# Patient Record
Sex: Male | Born: 1935
Health system: Southern US, Community
[De-identification: ages and names within clinical notes are randomized; demographics above are authoritative.]

## PROBLEM LIST (undated history)

## (undated) DIAGNOSIS — C4490 Unspecified malignant neoplasm of skin, unspecified: Secondary | ICD-10-CM

## (undated) DIAGNOSIS — I739 Peripheral vascular disease, unspecified: Secondary | ICD-10-CM

## (undated) DIAGNOSIS — I10 Essential (primary) hypertension: Secondary | ICD-10-CM

## (undated) DIAGNOSIS — I509 Heart failure, unspecified: Secondary | ICD-10-CM

## (undated) DIAGNOSIS — D649 Anemia, unspecified: Secondary | ICD-10-CM

## (undated) DIAGNOSIS — E119 Type 2 diabetes mellitus without complications: Secondary | ICD-10-CM

## (undated) DIAGNOSIS — E785 Hyperlipidemia, unspecified: Secondary | ICD-10-CM

## (undated) HISTORY — DX: Hyperlipidemia, unspecified: E78.5

## (undated) HISTORY — PX: BASAL CELL CARCINOMA EXCISION: SHX1214

## (undated) HISTORY — PX: TONSILLECTOMY: SHX5217

## (undated) HISTORY — DX: Peripheral vascular disease, unspecified: I73.9

## (undated) HISTORY — DX: Anemia, unspecified: D64.9

## (undated) HISTORY — PX: OTHER SURGICAL HISTORY: SHX169

## (undated) HISTORY — DX: Essential (primary) hypertension: I10

---

## 2006-07-01 ENCOUNTER — Ambulatory Visit: Payer: Self-pay | Admitting: Family Medicine

## 2006-07-08 ENCOUNTER — Ambulatory Visit: Payer: Self-pay | Admitting: Ophthalmology

## 2006-09-23 ENCOUNTER — Ambulatory Visit: Payer: Self-pay | Admitting: Gastroenterology

## 2007-05-11 ENCOUNTER — Ambulatory Visit: Payer: Self-pay | Admitting: Ophthalmology

## 2007-05-12 ENCOUNTER — Ambulatory Visit: Payer: Self-pay | Admitting: Ophthalmology

## 2007-06-10 ENCOUNTER — Other Ambulatory Visit: Payer: Self-pay

## 2007-06-10 ENCOUNTER — Emergency Department: Payer: Self-pay | Admitting: Emergency Medicine

## 2010-05-09 ENCOUNTER — Ambulatory Visit: Payer: Self-pay | Admitting: Family Medicine

## 2010-08-04 ENCOUNTER — Ambulatory Visit: Payer: Self-pay | Admitting: Internal Medicine

## 2010-08-05 ENCOUNTER — Inpatient Hospital Stay: Payer: Self-pay | Admitting: Internal Medicine

## 2010-08-09 LAB — PATHOLOGY REPORT

## 2010-09-01 ENCOUNTER — Inpatient Hospital Stay: Payer: Self-pay | Admitting: Internal Medicine

## 2010-09-03 ENCOUNTER — Ambulatory Visit: Payer: Self-pay | Admitting: Internal Medicine

## 2010-09-12 ENCOUNTER — Inpatient Hospital Stay: Payer: Self-pay | Admitting: Internal Medicine

## 2010-09-12 HISTORY — PX: OTHER SURGICAL HISTORY: SHX169

## 2010-09-21 LAB — PATHOLOGY REPORT

## 2010-10-04 ENCOUNTER — Ambulatory Visit: Payer: Self-pay | Admitting: Internal Medicine

## 2010-10-06 ENCOUNTER — Inpatient Hospital Stay: Payer: Self-pay | Admitting: Internal Medicine

## 2010-10-07 LAB — HM COLONOSCOPY

## 2011-03-03 DIAGNOSIS — C4442 Squamous cell carcinoma of skin of scalp and neck: Secondary | ICD-10-CM | POA: Insufficient documentation

## 2011-04-05 HISTORY — PX: MOHS SURGERY: SHX181

## 2011-08-09 ENCOUNTER — Emergency Department: Payer: Self-pay | Admitting: Emergency Medicine

## 2012-01-22 ENCOUNTER — Ambulatory Visit: Payer: Self-pay | Admitting: Family Medicine

## 2012-09-10 ENCOUNTER — Encounter: Payer: Self-pay | Admitting: Cardiothoracic Surgery

## 2012-09-10 ENCOUNTER — Encounter: Payer: Self-pay | Admitting: Nurse Practitioner

## 2012-09-30 ENCOUNTER — Ambulatory Visit: Payer: Self-pay | Admitting: Surgery

## 2012-09-30 LAB — CBC WITH DIFFERENTIAL/PLATELET
Basophil #: 0 10*3/uL (ref 0.0–0.1)
Basophil %: 0.9 %
Eosinophil #: 0.1 10*3/uL (ref 0.0–0.7)
Eosinophil %: 2.9 %
HCT: 30.6 % — ABNORMAL LOW (ref 40.0–52.0)
HGB: 10.3 g/dL — ABNORMAL LOW (ref 13.0–18.0)
Lymphocyte #: 1 10*3/uL (ref 1.0–3.6)
Lymphocyte %: 20.7 %
MCH: 30.1 pg (ref 26.0–34.0)
MCHC: 33.6 g/dL (ref 32.0–36.0)
MCV: 90 fL (ref 80–100)
Monocyte #: 0.7 x10 3/mm (ref 0.2–1.0)
Monocyte %: 14.2 %
Neutrophil #: 3 10*3/uL (ref 1.4–6.5)
Neutrophil %: 61.3 %
Platelet: 206 10*3/uL (ref 150–440)
RBC: 3.41 10*6/uL — ABNORMAL LOW (ref 4.40–5.90)
RDW: 13.2 % (ref 11.5–14.5)
WBC: 4.9 10*3/uL (ref 3.8–10.6)

## 2012-09-30 LAB — BASIC METABOLIC PANEL
Anion Gap: 5 — ABNORMAL LOW (ref 7–16)
BUN: 28 mg/dL — ABNORMAL HIGH (ref 7–18)
Calcium, Total: 8.5 mg/dL (ref 8.5–10.1)
Chloride: 111 mmol/L — ABNORMAL HIGH (ref 98–107)
Co2: 24 mmol/L (ref 21–32)
Creatinine: 1.33 mg/dL — ABNORMAL HIGH (ref 0.60–1.30)
EGFR (African American): 58 — ABNORMAL LOW
EGFR (Non-African Amer.): 50 — ABNORMAL LOW
Glucose: 90 mg/dL (ref 65–99)
Osmolality: 284 (ref 275–301)
Potassium: 4.5 mmol/L (ref 3.5–5.1)
Sodium: 140 mmol/L (ref 136–145)

## 2012-10-06 ENCOUNTER — Ambulatory Visit: Payer: Self-pay | Admitting: Surgery

## 2012-10-08 LAB — PATHOLOGY REPORT

## 2013-01-21 DIAGNOSIS — C4492 Squamous cell carcinoma of skin, unspecified: Secondary | ICD-10-CM | POA: Insufficient documentation

## 2013-02-10 DIAGNOSIS — Z4889 Encounter for other specified surgical aftercare: Secondary | ICD-10-CM | POA: Insufficient documentation

## 2013-02-10 DIAGNOSIS — Z09 Encounter for follow-up examination after completed treatment for conditions other than malignant neoplasm: Secondary | ICD-10-CM | POA: Insufficient documentation

## 2013-12-05 DIAGNOSIS — F09 Unspecified mental disorder due to known physiological condition: Secondary | ICD-10-CM | POA: Insufficient documentation

## 2013-12-23 ENCOUNTER — Other Ambulatory Visit: Payer: Self-pay | Admitting: Podiatry

## 2013-12-27 LAB — WOUND CULTURE

## 2014-07-04 DIAGNOSIS — N183 Chronic kidney disease, stage 3 (moderate): Secondary | ICD-10-CM | POA: Diagnosis not present

## 2014-07-04 DIAGNOSIS — E1129 Type 2 diabetes mellitus with other diabetic kidney complication: Secondary | ICD-10-CM | POA: Diagnosis not present

## 2014-07-04 DIAGNOSIS — Z1389 Encounter for screening for other disorder: Secondary | ICD-10-CM | POA: Diagnosis not present

## 2014-07-04 DIAGNOSIS — I10 Essential (primary) hypertension: Secondary | ICD-10-CM | POA: Diagnosis not present

## 2014-07-04 DIAGNOSIS — D509 Iron deficiency anemia, unspecified: Secondary | ICD-10-CM | POA: Diagnosis not present

## 2014-07-04 LAB — BASIC METABOLIC PANEL
BUN: 22 mg/dL — AB (ref 4–21)
Creatinine: 1.4 mg/dL — AB (ref 0.6–1.3)
GLUCOSE: 109 mg/dL
Potassium: 5 mmol/L (ref 3.4–5.3)
Sodium: 139 mmol/L (ref 137–147)

## 2014-07-04 LAB — CBC AND DIFFERENTIAL
HEMATOCRIT: 37 % — AB (ref 41–53)
HEMOGLOBIN: 12.1 g/dL — AB (ref 13.5–17.5)
Platelets: 240 10*3/uL (ref 150–399)
WBC: 6.2 10*3/mL

## 2014-07-04 LAB — PSA: PSA: 2.4

## 2014-07-04 LAB — HEMOGLOBIN A1C: HEMOGLOBIN A1C: 6.9 % — AB (ref 4.0–6.0)

## 2014-08-25 NOTE — Op Note (Signed)
PATIENT NAME:  CARSON, PRYOR MR#:  [id number] DATE OF BIRTH:  1931/03/22  DATE OF PROCEDURE:  10/06/2012  PREOPERATIVE DIAGNOSIS: Multiple soft tissue abscesses and plaques, in need of debridement.   POSTOPERATIVE DIAGNOSIS: Multiple scalp abscesses and plaques and soft tissue nodules.    PROCEDURE PERFORMED:  1. Incision, drainage and debridement of multiple scalp abscesses and necrotic tissue and plaques.  2. Incisional biopsy of scalp mass.   SURGEON: Ashlen Kiger A. Ronee Ranganathan, MD  ESTIMATED BLOOD LOSS: 30 mL.  COMPLICATIONS: None.   SPECIMEN: Incisional biopsy of apical scalp nodule.   ANESTHESIA: General.   INDICATION FOR SURGERY: Mr. Clyne is an 81 year old male who presents with a chronic history of scalp plaques and soft tissue abscesses which are recurrent on his scalp. He also has a history of scalp basal cell and squamous cell cancers.   DETAILS OF PROCEDURE: As follows: Mr. Bargar was brought to the operating room suite after informed consent was obtained. He was laid supine on the operating room table. He was induced. Endotracheal tube was placed. General anesthesia was administered. His head was prepped and draped in standard surgical fashion. A timeout was then performed, correctly identifying the patient name, operative site and procedure to be performed. I then proceeded to excise all plaques with underlying abscesses from Mr. Babauta scalp. There was mild bleeding with removal of these plaques, but there were no deep abscesses. There were some soft tissue masses which were concerning and for which I will refer the patient back to Dr. Nehemiah Massed. There was an apical scalp mass which looked consistent with a basal cell cancer, and I therefore performed an incisional biopsy of this. After all lesions were removed and hemostasis was obtained, I then placed bacitracin with nonstick Adaptic gauze over the entirety of the skull. I then placed 4 x 4 gauze sponges and  wrapped his head in Kerlix. He is to return in 48 hours for dressing removal and likely will start bacitracin on the scalp at that time. The dressings were then taken down. He was awoken, extubated and brought to the postanesthesia care unit. There were no immediate complications. Needle, sponge and instrument counts were correct at the end of the procedure.   ____________________________ Glena Norfolk. Shivansh Hardaway, MD cal:OSi D: 10/07/2012 07:02:38 ET T: 10/07/2012 07:13:08 ET JOB#: [id number]  cc: Harrell Gave A. Yao Hyppolite, MD, <Dictator> Floyde Parkins MD ELECTRONICALLY SIGNED 10/11/2012 20:58

## 2014-09-12 DIAGNOSIS — C4361 Malignant melanoma of right upper limb, including shoulder: Secondary | ICD-10-CM | POA: Diagnosis not present

## 2014-09-12 DIAGNOSIS — C44329 Squamous cell carcinoma of skin of other parts of face: Secondary | ICD-10-CM | POA: Diagnosis not present

## 2014-09-12 DIAGNOSIS — D485 Neoplasm of uncertain behavior of skin: Secondary | ICD-10-CM | POA: Diagnosis not present

## 2014-09-12 DIAGNOSIS — D0361 Melanoma in situ of right upper limb, including shoulder: Secondary | ICD-10-CM | POA: Diagnosis not present

## 2014-09-12 DIAGNOSIS — Z85828 Personal history of other malignant neoplasm of skin: Secondary | ICD-10-CM | POA: Diagnosis not present

## 2014-09-12 DIAGNOSIS — C44221 Squamous cell carcinoma of skin of unspecified ear and external auricular canal: Secondary | ICD-10-CM | POA: Diagnosis not present

## 2014-10-27 ENCOUNTER — Other Ambulatory Visit: Payer: Self-pay | Admitting: Family Medicine

## 2014-10-27 DIAGNOSIS — I10 Essential (primary) hypertension: Secondary | ICD-10-CM | POA: Insufficient documentation

## 2015-01-17 DIAGNOSIS — N184 Chronic kidney disease, stage 4 (severe): Secondary | ICD-10-CM | POA: Insufficient documentation

## 2015-01-17 DIAGNOSIS — E1121 Type 2 diabetes mellitus with diabetic nephropathy: Secondary | ICD-10-CM | POA: Insufficient documentation

## 2015-01-17 DIAGNOSIS — Z89511 Acquired absence of right leg below knee: Secondary | ICD-10-CM | POA: Insufficient documentation

## 2015-01-17 DIAGNOSIS — J309 Allergic rhinitis, unspecified: Secondary | ICD-10-CM | POA: Insufficient documentation

## 2015-01-17 DIAGNOSIS — M109 Gout, unspecified: Secondary | ICD-10-CM | POA: Insufficient documentation

## 2015-01-17 DIAGNOSIS — S88111A Complete traumatic amputation at level between knee and ankle, right lower leg, initial encounter: Secondary | ICD-10-CM | POA: Insufficient documentation

## 2015-01-17 DIAGNOSIS — S5290XA Unspecified fracture of unspecified forearm, initial encounter for closed fracture: Secondary | ICD-10-CM | POA: Insufficient documentation

## 2015-01-17 DIAGNOSIS — L409 Psoriasis, unspecified: Secondary | ICD-10-CM | POA: Insufficient documentation

## 2015-01-17 DIAGNOSIS — E785 Hyperlipidemia, unspecified: Secondary | ICD-10-CM | POA: Insufficient documentation

## 2015-01-17 DIAGNOSIS — C4490 Unspecified malignant neoplasm of skin, unspecified: Secondary | ICD-10-CM | POA: Insufficient documentation

## 2015-01-17 DIAGNOSIS — R609 Edema, unspecified: Secondary | ICD-10-CM | POA: Insufficient documentation

## 2015-01-17 DIAGNOSIS — Z85828 Personal history of other malignant neoplasm of skin: Secondary | ICD-10-CM | POA: Insufficient documentation

## 2015-01-17 DIAGNOSIS — D638 Anemia in other chronic diseases classified elsewhere: Secondary | ICD-10-CM | POA: Insufficient documentation

## 2015-01-19 ENCOUNTER — Encounter: Payer: Self-pay | Admitting: Family Medicine

## 2015-01-19 ENCOUNTER — Ambulatory Visit (INDEPENDENT_AMBULATORY_CARE_PROVIDER_SITE_OTHER): Payer: Medicare Other | Admitting: Family Medicine

## 2015-01-19 VITALS — BP 122/62 | HR 62 | Temp 97.5°F | Resp 16 | Ht 68.0 in | Wt 235.0 lb

## 2015-01-19 DIAGNOSIS — I10 Essential (primary) hypertension: Secondary | ICD-10-CM

## 2015-01-19 DIAGNOSIS — N183 Chronic kidney disease, stage 3 unspecified: Secondary | ICD-10-CM

## 2015-01-19 DIAGNOSIS — Z23 Encounter for immunization: Secondary | ICD-10-CM | POA: Diagnosis not present

## 2015-01-19 DIAGNOSIS — D539 Nutritional anemia, unspecified: Secondary | ICD-10-CM

## 2015-01-19 DIAGNOSIS — E785 Hyperlipidemia, unspecified: Secondary | ICD-10-CM | POA: Diagnosis not present

## 2015-01-19 DIAGNOSIS — R9431 Abnormal electrocardiogram [ECG] [EKG]: Secondary | ICD-10-CM

## 2015-01-19 DIAGNOSIS — E1121 Type 2 diabetes mellitus with diabetic nephropathy: Secondary | ICD-10-CM

## 2015-01-19 DIAGNOSIS — I4892 Unspecified atrial flutter: Secondary | ICD-10-CM | POA: Insufficient documentation

## 2015-01-19 DIAGNOSIS — Z0000 Encounter for general adult medical examination without abnormal findings: Secondary | ICD-10-CM

## 2015-01-19 NOTE — Patient Instructions (Signed)
Atrial Flutter °Atrial flutter is a heart rhythm that can cause the heart to beat very fast (tachycardia). It originates in the upper chambers of the heart (atria). In atrial flutter, the top chambers of the heart (atria) often beat much faster than the bottom chambers of the heart (ventricles). Atrial flutter has a regular "saw toothed" appearance in an EKG readout. An EKG is a test that records the electrical activity of the heart. Atrial flutter can cause the heart to beat up to 150 beats per minute (BPM). Atrial flutter can either be short lived (paroxysmal) or permanent.  °CAUSES  °Causes of atrial flutter can be many. Some of these include: °· Heart related issues: °¨ Heart attack (myocardial infarction). °¨ Heart failure. °¨ Heart valve problems. °¨ Poorly controlled high blood pressure (hypertension). °¨ After open heart surgery. °· Lung related issues: °¨ A blood clot in the lungs (pulmonary embolism). °¨ Chronic obstructive pulmonary disease (COPD). Medications used to treat COPD can attribute to atrial flutter. °· Other related causes: °¨ Hyperthyroidism. °¨ Caffeine. °¨ Some decongestant cold medications. °¨ Low electrolyte levels such as potassium or magnesium. °¨ Cocaine. °SYMPTOMS °· An awareness of your heart beating rapidly (palpitations). °· Shortness of breath. °· Chest pain. °· Low blood pressure (hypotension). °· Dizziness or fainting. °DIAGNOSIS  °Different tests can be performed to diagnose atrial flutter.  °· An EKG. °· Holter monitor. This is a 24-hour recording of your heart rhythm. You will also be given a diary. Write down all symptoms that you have and what you were doing at the time you experienced symptoms. °· Cardiac event monitor. This small device can be worn for up to 30 days. When you have heart symptoms, you will push a button on the device. This will then record your heart rhythm. °· Echocardiogram. This is an imaging test to look at your heart. Your caregiver will look at your  heart valves and the ventricles. °· Stress test. This test can help determine if the atrial flutter is related to exercise or if coronary artery disease is present. °· Laboratory studies will look at certain blood levels like: °¨ Complete blood count (CBC). °¨ Potassium. °¨ Magnesium. °¨ Thyroid function. °TREATMENT  °Treatment of atrial flutter varies. A combination of therapies may be used or sometimes atrial flutter may need only 1 type of treatment.  °Lab work: °If your blood work, such as your electrolytes (potassium, magnesium) or your thyroid function tests, are abnormal, your caregiver will treat them accordingly.  °Medication:  °There are several different types of medications that can convert your heart to a normal rhythm and prevent atrial flutter from reoccurring.  °Nonsurgical procedures: °Nonsurgical techniques may be used to control atrial flutter. Some examples include: °· Cardioversion. This technique uses either drugs or an electrical shock to restore a normal heart rhythm: °¨ Cardioversion drugs may be given through an intravenous (IV) line to help "reset" the heart rhythm. °¨ In electrical cardioversion, your caregiver shocks your heart with electrical energy. This helps to reset the heartbeat to a normal rhythm. °· Ablation. If atrial flutter is a persistent problem, an ablation may be needed. This procedure is done under mild sedation. High frequency radio-wave energy is used to destroy the area of heart tissue responsible for atrial flutter. °SEEK IMMEDIATE MEDICAL CARE IF:  °You have: °· Dizziness. °· Near fainting or fainting. °· Shortness of breath. °· Chest pain or pressure. °· Sudden nausea or vomiting. °· Profuse sweating. °If you have the above symptoms,   call your local emergency service immediately! Do not drive yourself to the hospital. °MAKE SURE YOU:  °· Understand these instructions. °· Will watch your condition. °· Will get help right away if you are not doing well or get  worse. °Document Released: 09/07/2008 Document Revised: 09/05/2013 Document Reviewed: 09/07/2008 °ExitCare® Patient Information ©2015 [employer]. This information is not intended to replace advice given to you by your health care provider. Make sure you discuss any questions you have with your health care provider. ° °

## 2015-01-19 NOTE — Progress Notes (Signed)
Patient: Miguel Torres, Male    DOB: 05-17-1930, 81 y.o.   MRN: [id number] Visit Date: 01/19/2015  Today's Provider: Lelon Huh, MD   Chief Complaint  Patient presents with  . Physical  . Hypertension    Follow up  . Diabetes    Follow up  . Hyperlipidemia    Follow up   Subjective:    Annual physical  Miguel Torres is a 81 y.o. male. He feels fairly well. He reports exercising none. He reports he is sleeping fairly well.  -----------------------------------------------------------   Diabetes Mellitus Type II, Follow-up:   Lab Results  Component Value Date   HGBA1C 6.9* 07/04/2014    Last seen for diabetes 6 months ago.  Management since then includes  temporally putting Metformin on hold due to possible cause of Edema. He reports good compliance with treatment. He is not having side effects.  Current symptoms include polyuria and have been stable. Home blood sugar records: home glucose is not neing checked  Episodes of hypoglycemia? no   Current Insulin Regimen:  Patient is not taking prescribed Insulins  Most Recent Eye Exam: >1 year Weight trend: stable Prior visit with dietician: no Current diet: in general, a "healthy" diet   Current exercise: none  Pertinent Labs:    Component Value Date/Time   CREATININE 1.4* 07/04/2014   CREATININE 1.33* 09/30/2012 1609    Wt Readings from Last 3 Encounters:  07/04/14 246 lb (111.585 kg)    ------------------------------------------------------------------------   Hypertension, follow-up:  BP Readings from Last 3 Encounters:  07/04/14 128/82    He was last seen for hypertension 6 months ago.  BP at that visit was  128/82. Management since that visit includes no changes . He reports good compliance with treatment. He is not having side effects.  He is not exercising. He is not adherent to low salt diet.   Outside blood pressures are  Not being checked. He is experiencing dyspnea,  fatigue and lower extremity edema.  Patient denies chest pain, chest pressure/discomfort, claudication, exertional chest pressure/discomfort, irregular heart beat, near-syncope, orthopnea, palpitations, paroxysmal nocturnal dyspnea, syncope and tachypnea.   Cardiovascular risk factors include diabetes mellitus, dyslipidemia, hypertension and male gender.  Use of agents associated with hypertension: none.     Weight trend: stable Wt Readings from Last 3 Encounters:  07/04/14 246 lb (111.585 kg)    Current diet: in general, a "healthy" diet    ------------------------------------------------------------------------   Lipid/Cholesterol, Follow-up:   Last seen for this6 months ago.  Management changes since that visit include  none. . Last Lipid Panel: No results found for: CHOL, TRIG, HDL, CHOLHDL, VLDL, LDLCALC, LDLDIRECT  Risk factors for vascular disease include diabetes mellitus, hypercholesterolemia and hypertension  He reports good compliance with treatment. He is not having side effects.  Current symptoms include polyuria and have been stable. Weight trend: stable Prior visit with dietician: no Current diet: in general, a "healthy" diet   Current exercise: none  Wt Readings from Last 3 Encounters:  07/04/14 246 lb (111.585 kg)    -------------------------------------------------------------------  Review of Systems  Constitutional: Positive for fatigue. Negative for fever, chills and appetite change.  HENT: Positive for postnasal drip, sinus pressure and sneezing. Negative for congestion, ear pain, hearing loss, nosebleeds and trouble swallowing.   Eyes: Positive for photophobia, discharge and redness. Negative for pain and visual disturbance.  Respiratory: Positive for cough and shortness of breath. Negative for chest tightness.   Cardiovascular: Positive  for leg swelling. Negative for chest pain and palpitations.  Gastrointestinal: Negative for nausea, vomiting,  abdominal pain, diarrhea, constipation and blood in stool.  Endocrine: Negative for polydipsia, polyphagia and polyuria.  Genitourinary: Positive for frequency and penile swelling. Negative for dysuria, urgency, flank pain and decreased urine volume.  Musculoskeletal: Positive for gait problem and neck pain. Negative for myalgias, back pain, joint swelling, arthralgias and neck stiffness.  Skin: Positive for rash and wound. Negative for color change.  Allergic/Immunologic: Positive for environmental allergies.  Neurological: Positive for speech difficulty. Negative for dizziness, tremors, seizures, weakness, light-headedness and headaches.  Hematological: Bruises/bleeds easily.  Psychiatric/Behavioral: Negative for behavioral problems, confusion, sleep disturbance, dysphoric mood and decreased concentration. The patient is not nervous/anxious.   All other systems reviewed and are negative.   Social History   Social History  . Marital Status: Married    Spouse Name: N/A  . Number of Children: 5  . Years of Education: 5th grade   Occupational History  . Not on file.   Social History Main Topics  . Smoking status: Never Smoker   . Smokeless tobacco: Never Used  . Alcohol Use: No  . Drug Use: No  . Sexual Activity: Not on file   Other Topics Concern  . Not on file   Social History Narrative  . No narrative on file    Patient Active Problem List   Diagnosis Date Noted  . Allergic rhinitis 01/17/2015  . Deficiency anemia 01/17/2015  . Chronic kidney disease (CKD), stage III (moderate) 01/17/2015  . Diabetes 01/17/2015  . Edema 01/17/2015  . Gout 01/17/2015  . Amputation of right lower extremity below knee upon examination 01/17/2015  . History of other malignant neoplasm of skin 01/17/2015  . HLD (hyperlipidemia) 01/17/2015  . Psoriasis 01/17/2015  . Closed fracture of radius 01/17/2015  . CA of skin 01/17/2015  . Hypertension 10/27/2014  . Mild cognitive disorder  12/05/2013  . Squamous cell carcinoma of scalp and skin of neck 03/03/2011  . Malignant neoplasm 10/09/2008    Past Surgical History  Procedure Laterality Date  . Basal cell carcinoma excision    . History of eye surgery Bilateral   . Tonsillectomy    . Right bka  09/12/2010    Dr. Delana Meyer; Secondary gangrenous right LE  . Mohs surgery  04/2011    the top of his head    His family history includes Arthritis in his sister; Diabetes in his mother.    Previous Medications   ACETAMINOPHEN (TYLENOL) 325 MG TABLET    Take 650 mg by mouth. As needed   ALLOPURINOL (ZYLOPRIM) 300 MG TABLET    Take 300 mg by mouth 2 (two) times daily.   ATENOLOL-CHLORTHALIDONE (TENORETIC) 50-25 MG PER TABLET    Take 1 tablet by mouth daily.   FELODIPINE (PLENDIL) 2.5 MG 24 HR TABLET    TAKE 1 TABLET BY MOUTH ONCE A DAY   FERROUS SULFATE 325 (65 FE) MG TABLET    Take 1 tablet by mouth daily.   FLUOCINONIDE OINTMENT (LIDEX) 0.05 %    FLUOCINONIDE, 0.05% (External Ointment) - Historical Medication  (0.05 %) Active   FUROSEMIDE (LASIX) 20 MG TABLET    Take 1 tablet by mouth daily as needed. For swelling   GLIPIZIDE (GLUCOTROL XL) 10 MG 24 HR TABLET    Take 10 mg by mouth daily.   HYDROCODONE-ACETAMINOPHEN (NORCO/VICODIN) 5-325 MG PER TABLET    Take 1 tablet by mouth every 6 (six) hours as  needed.   INDOMETHACIN (INDOCIN) 50 MG CAPSULE    Take 1 capsule by mouth 3 (three) times daily.   IPRATROPIUM (ATROVENT) 0.03 % NASAL SPRAY    Place into the nose. 2 sprays 2 times per day in each nostril   LISINOPRIL (PRINIVIL,ZESTRIL) 40 MG TABLET    Take 40 mg by mouth daily.   LORATADINE (CLARITIN) 10 MG TABLET    Take 10 mg by mouth daily.   LOVASTATIN (MEVACOR) 40 MG TABLET    Take 40 mg by mouth daily.   METFORMIN (GLUCOPHAGE-XR) 500 MG 24 HR TABLET    Take 500 mg by mouth daily.   MONTELUKAST (SINGULAIR) 10 MG TABLET    Take 10 mg by mouth every evening.   PIOGLITAZONE (ACTOS) 45 MG TABLET    Take 45 mg by mouth  daily.   POLYETHYLENE GLYCOL (MIRALAX / GLYCOLAX) PACKET    Take by mouth.   TAMSULOSIN (FLOMAX) 0.4 MG CAPS CAPSULE    Take 0.4 mg by mouth.   TRIAMCINOLONE LOTION (KENALOG) 0.1 %    TRIAMCINOLONE ACETONIDE, 0.1% (External Lotion)  1 application daily for 0 days  Quantity: 120;  Refills: 3   Ordered :26-November-2010  [name] ;  Started 26-November-2010 Active    Patient Care Team: [name], MD as PCP - General (Family Medicine) Nelda Bucks, MD (Dermatology) Laural Roes, MD (Ophthalmology)     Objective:   Vitals: BP 122/62 mmHg  Pulse 62  Temp(Src) 97.5 F (36.4 C) (Oral)  Resp 16  Ht 5\' 8"  (1.727 m)  Wt 235 lb (106.595 kg)  BMI 35.74 kg/m2  SpO2 100%  Physical Exam   General Appearance:    Alert, cooperative, no distress, obese.   Eyes:    PERRL, conjunctiva/corneas clear, EOM's intact       Skin:   Extensive hyperkeratotic exophytic lesions scattered over scalp.   Lungs:     Clear to auscultation bilaterally, respirations unlabored  Heart:    Regular rate and rhythm. I/IV systolic murmur.   Neurologic:   Awake, alert, oriented x 3. No apparent focal neurological           defect.   MS:   S/p Right BKA. 2+ edema Left LE    Activities of Daily Living In your present state of health, do you have any difficulty performing the following activities: 01/19/2015  Hearing? Y  Vision? N  Difficulty concentrating or making decisions? N  Walking or climbing stairs? Y  Dressing or bathing? Y  Doing errands, shopping? N    Fall Risk Assessment Fall Risk  01/19/2015  Falls in the past year? No     Depression Screen PHQ 2/9 Scores 01/19/2015  PHQ - 2 Score 0  PHQ- 9 Score 2    Cognitive Testing - 6-CIT  Correct? Score   What year is it? yes 0 0 or 4  What month is it? yes 0 0 or 3  Memorize:    Pia Mau,  44,  [place],      What time is it? (within 1 hour) yes 0 0 or 3  Count backwards from 20 yes 0 0, 2, or 4  Name the months of  the year yes 0 0, 2, or 4  Repeat name & address above no 8 0, 2, 4, 6, 8, or 10       TOTAL SCORE  8/28   Interpretation:  Abnormal- .  Normal (0-7) Abnormal (8-28)  Audit-C Alcohol Use Screening  Question Answer Points  How often do you have alcoholic drink? never 0  How many drinks do you typically consume in a day? 0 0  How oftey will you drink 6 or more in a total? never 0  Total Score:  0   A score of 3 or more in women, and 4 or more in men indicates increased risk for alcohol abuse, EXCEPT if all of the points are from question 1.  EKG: Atrial flutter, Lateral ischemia Assessment & Plan:     Annual Wellness Visit  Reviewed patient's Family Medical History Reviewed and updated list of patient's medical providers Assessment of cognitive impairment was done Assessed patient's functional ability Established a written schedule for health screening [place] Completed and Reviewed  Exercise Activities and Dietary recommendations Goals    None      Immunization History  Administered Date(s) Administered  . Pneumococcal Conjugate-13 12/05/2013  . Pneumococcal Polysaccharide-23 02/22/2007    Health Maintenance  Topic Date Due  . FOOT EXAM  09/04/1942  . OPHTHALMOLOGY EXAM  09/04/1942  . URINE MICROALBUMIN  09/04/1942  . TETANUS/TDAP  09/04/1951  . ZOSTAVAX  09/03/1992  . INFLUENZA VACCINE  12/04/2014  . HEMOGLOBIN A1C  01/04/2015  . COLONOSCOPY  10/06/2020  . PNA vac Low Risk Adult  Completed      Discussed health benefits of physical activity, and encouraged him to engage in regular exercise appropriate for his age and condition.    ------------------------------------------------------------------------------------------------------------ 1. Annual physical exam   2. Chronic kidney disease (CKD), stage III (moderate)  - Renal function panel  3. Deficiency anemia  - CBC  4. Type 2 diabetes mellitus with diabetic  nephropathy  - Hemoglobin A1c  5. HLD (hyperlipidemia) He is tolerating lovastatin well with no adverse effects.   - Lipid panel - TSH - Hepatic function panel  6. Essential hypertension well controlled Continue current medications.   - EKG 12-Lead  7. Need for influenza vaccination  - Flu vaccine HIGH DOSE PF  8. Atrial flutter, unspecified New finding since EKG of 2014. Rate is well controlled, probably due to being on atenolol.  - Ambulatory referral to Cardiology  9. Abnormal finding on EKG Lateral ischemic changes on EKG. Continue beta-blocker. Start 81mg  [facility] - Ambulatory referral to Cardiology

## 2015-01-20 LAB — CBC
HEMATOCRIT: 34.8 % — AB (ref 37.5–51.0)
HEMOGLOBIN: 11.7 g/dL — AB (ref 12.6–17.7)
MCH: 29.7 pg (ref 26.6–33.0)
MCHC: 33.6 g/dL (ref 31.5–35.7)
MCV: 88 fL (ref 79–97)
Platelets: 252 10*3/uL (ref 150–379)
RBC: 3.94 x10E6/uL — ABNORMAL LOW (ref 4.14–5.80)
RDW: 13.3 % (ref 12.3–15.4)
WBC: 8.2 10*3/uL (ref 3.4–10.8)

## 2015-01-20 LAB — RENAL FUNCTION PANEL
ALBUMIN: 3.5 g/dL (ref 3.5–4.7)
BUN / CREAT RATIO: 20 (ref 10–22)
BUN: 31 mg/dL — AB (ref 8–27)
CALCIUM: 8.7 mg/dL (ref 8.6–10.2)
CHLORIDE: 100 mmol/L (ref 97–108)
CO2: 23 mmol/L (ref 18–29)
CREATININE: 1.57 mg/dL — AB (ref 0.76–1.27)
GFR calc Af Amer: 47 mL/min/{1.73_m2} — ABNORMAL LOW (ref >59)
GFR calc non Af Amer: 40 mL/min/{1.73_m2} — ABNORMAL LOW (ref >59)
Glucose: 227 mg/dL — ABNORMAL HIGH (ref 65–99)
PHOSPHORUS: 3.2 mg/dL (ref 2.5–4.5)
Potassium: 4.8 mmol/L (ref 3.5–5.2)
Sodium: 137 mmol/L (ref 134–144)

## 2015-01-20 LAB — LIPID PANEL
CHOL/HDL RATIO: 2.4 ratio (ref 0.0–5.0)
CHOLESTEROL TOTAL: 103 mg/dL (ref 100–199)
HDL: 43 mg/dL (ref >39)
LDL Calculated: 36 mg/dL (ref 0–99)
Triglycerides: 121 mg/dL (ref 0–149)
VLDL Cholesterol Cal: 24 mg/dL (ref 5–40)

## 2015-01-20 LAB — HEPATIC FUNCTION PANEL
ALT: 13 IU/L (ref 0–44)
AST: 22 IU/L (ref 0–40)
Alkaline Phosphatase: 96 IU/L (ref 39–117)
BILIRUBIN, DIRECT: 0.07 mg/dL (ref 0.00–0.40)
TOTAL PROTEIN: 6.6 g/dL (ref 6.0–8.5)

## 2015-01-20 LAB — HEMOGLOBIN A1C
ESTIMATED AVERAGE GLUCOSE: 171 mg/dL
HEMOGLOBIN A1C: 7.6 % — AB (ref 4.8–5.6)

## 2015-01-20 LAB — TSH: TSH: 2.45 u[IU]/mL (ref 0.450–4.500)

## 2015-01-22 ENCOUNTER — Telehealth: Payer: Self-pay | Admitting: Family Medicine

## 2015-01-22 NOTE — Telephone Encounter (Signed)
resutls given to pt's daughter Shirlean Mylar. Also, advised Shirlean Mylar that patient should be taking coated aspirin 81 mg. Shirlean Mylar also stated that they waiting to here about referral to cardiologist.

## 2015-01-22 NOTE — Telephone Encounter (Signed)
Pt's daughter Shirlean Mylar calling [facility]. Back about test results.  CC

## 2015-02-02 DIAGNOSIS — K264 Chronic or unspecified duodenal ulcer with hemorrhage: Secondary | ICD-10-CM | POA: Insufficient documentation

## 2015-02-16 ENCOUNTER — Other Ambulatory Visit: Payer: Self-pay | Admitting: Family Medicine

## 2015-03-02 ENCOUNTER — Other Ambulatory Visit: Payer: Self-pay | Admitting: Family Medicine

## 2015-03-07 DIAGNOSIS — C4359 Malignant melanoma of other part of trunk: Secondary | ICD-10-CM | POA: Diagnosis not present

## 2015-03-07 DIAGNOSIS — C44320 Squamous cell carcinoma of skin of unspecified parts of face: Secondary | ICD-10-CM | POA: Diagnosis not present

## 2015-03-07 DIAGNOSIS — I10 Essential (primary) hypertension: Secondary | ICD-10-CM | POA: Diagnosis not present

## 2015-03-07 DIAGNOSIS — E785 Hyperlipidemia, unspecified: Secondary | ICD-10-CM | POA: Diagnosis not present

## 2015-03-07 DIAGNOSIS — C4442 Squamous cell carcinoma of skin of scalp and neck: Secondary | ICD-10-CM | POA: Diagnosis not present

## 2015-03-07 DIAGNOSIS — E119 Type 2 diabetes mellitus without complications: Secondary | ICD-10-CM | POA: Diagnosis not present

## 2015-03-07 DIAGNOSIS — C4362 Malignant melanoma of left upper limb, including shoulder: Secondary | ICD-10-CM | POA: Diagnosis not present

## 2015-03-07 DIAGNOSIS — Z483 Aftercare following surgery for neoplasm: Secondary | ICD-10-CM | POA: Diagnosis not present

## 2015-03-09 DIAGNOSIS — C4442 Squamous cell carcinoma of skin of scalp and neck: Secondary | ICD-10-CM | POA: Diagnosis not present

## 2015-03-09 DIAGNOSIS — C44320 Squamous cell carcinoma of skin of unspecified parts of face: Secondary | ICD-10-CM | POA: Diagnosis not present

## 2015-03-09 DIAGNOSIS — Z483 Aftercare following surgery for neoplasm: Secondary | ICD-10-CM | POA: Diagnosis not present

## 2015-03-09 DIAGNOSIS — C4362 Malignant melanoma of left upper limb, including shoulder: Secondary | ICD-10-CM | POA: Diagnosis not present

## 2015-03-09 DIAGNOSIS — I10 Essential (primary) hypertension: Secondary | ICD-10-CM | POA: Diagnosis not present

## 2015-03-09 DIAGNOSIS — C4359 Malignant melanoma of other part of trunk: Secondary | ICD-10-CM | POA: Diagnosis not present

## 2015-03-09 DIAGNOSIS — E119 Type 2 diabetes mellitus without complications: Secondary | ICD-10-CM | POA: Diagnosis not present

## 2015-03-09 DIAGNOSIS — E785 Hyperlipidemia, unspecified: Secondary | ICD-10-CM | POA: Diagnosis not present

## 2015-03-12 DIAGNOSIS — E785 Hyperlipidemia, unspecified: Secondary | ICD-10-CM | POA: Diagnosis not present

## 2015-03-12 DIAGNOSIS — E119 Type 2 diabetes mellitus without complications: Secondary | ICD-10-CM | POA: Diagnosis not present

## 2015-03-12 DIAGNOSIS — I10 Essential (primary) hypertension: Secondary | ICD-10-CM | POA: Diagnosis not present

## 2015-03-12 DIAGNOSIS — C44320 Squamous cell carcinoma of skin of unspecified parts of face: Secondary | ICD-10-CM | POA: Diagnosis not present

## 2015-03-12 DIAGNOSIS — C4359 Malignant melanoma of other part of trunk: Secondary | ICD-10-CM | POA: Diagnosis not present

## 2015-03-12 DIAGNOSIS — C4362 Malignant melanoma of left upper limb, including shoulder: Secondary | ICD-10-CM | POA: Diagnosis not present

## 2015-03-12 DIAGNOSIS — Z483 Aftercare following surgery for neoplasm: Secondary | ICD-10-CM | POA: Diagnosis not present

## 2015-03-12 DIAGNOSIS — C4442 Squamous cell carcinoma of skin of scalp and neck: Secondary | ICD-10-CM | POA: Diagnosis not present

## 2015-03-14 DIAGNOSIS — C4442 Squamous cell carcinoma of skin of scalp and neck: Secondary | ICD-10-CM | POA: Diagnosis not present

## 2015-03-14 DIAGNOSIS — E785 Hyperlipidemia, unspecified: Secondary | ICD-10-CM | POA: Diagnosis not present

## 2015-03-14 DIAGNOSIS — C44320 Squamous cell carcinoma of skin of unspecified parts of face: Secondary | ICD-10-CM | POA: Diagnosis not present

## 2015-03-14 DIAGNOSIS — C4359 Malignant melanoma of other part of trunk: Secondary | ICD-10-CM | POA: Diagnosis not present

## 2015-03-14 DIAGNOSIS — Z483 Aftercare following surgery for neoplasm: Secondary | ICD-10-CM | POA: Diagnosis not present

## 2015-03-14 DIAGNOSIS — C4362 Malignant melanoma of left upper limb, including shoulder: Secondary | ICD-10-CM | POA: Diagnosis not present

## 2015-03-14 DIAGNOSIS — I10 Essential (primary) hypertension: Secondary | ICD-10-CM | POA: Diagnosis not present

## 2015-03-14 DIAGNOSIS — E119 Type 2 diabetes mellitus without complications: Secondary | ICD-10-CM | POA: Diagnosis not present

## 2015-03-16 DIAGNOSIS — C4362 Malignant melanoma of left upper limb, including shoulder: Secondary | ICD-10-CM | POA: Diagnosis not present

## 2015-03-16 DIAGNOSIS — C44320 Squamous cell carcinoma of skin of unspecified parts of face: Secondary | ICD-10-CM | POA: Diagnosis not present

## 2015-03-16 DIAGNOSIS — Z483 Aftercare following surgery for neoplasm: Secondary | ICD-10-CM | POA: Diagnosis not present

## 2015-03-16 DIAGNOSIS — E119 Type 2 diabetes mellitus without complications: Secondary | ICD-10-CM | POA: Diagnosis not present

## 2015-03-16 DIAGNOSIS — C4442 Squamous cell carcinoma of skin of scalp and neck: Secondary | ICD-10-CM | POA: Diagnosis not present

## 2015-03-16 DIAGNOSIS — C4359 Malignant melanoma of other part of trunk: Secondary | ICD-10-CM | POA: Diagnosis not present

## 2015-03-16 DIAGNOSIS — E785 Hyperlipidemia, unspecified: Secondary | ICD-10-CM | POA: Diagnosis not present

## 2015-03-16 DIAGNOSIS — I10 Essential (primary) hypertension: Secondary | ICD-10-CM | POA: Diagnosis not present

## 2015-03-19 DIAGNOSIS — E785 Hyperlipidemia, unspecified: Secondary | ICD-10-CM | POA: Diagnosis not present

## 2015-03-19 DIAGNOSIS — C4362 Malignant melanoma of left upper limb, including shoulder: Secondary | ICD-10-CM | POA: Diagnosis not present

## 2015-03-19 DIAGNOSIS — Z483 Aftercare following surgery for neoplasm: Secondary | ICD-10-CM | POA: Diagnosis not present

## 2015-03-19 DIAGNOSIS — C4359 Malignant melanoma of other part of trunk: Secondary | ICD-10-CM | POA: Diagnosis not present

## 2015-03-19 DIAGNOSIS — I10 Essential (primary) hypertension: Secondary | ICD-10-CM | POA: Diagnosis not present

## 2015-03-19 DIAGNOSIS — E119 Type 2 diabetes mellitus without complications: Secondary | ICD-10-CM | POA: Diagnosis not present

## 2015-03-19 DIAGNOSIS — C4442 Squamous cell carcinoma of skin of scalp and neck: Secondary | ICD-10-CM | POA: Diagnosis not present

## 2015-03-19 DIAGNOSIS — C44320 Squamous cell carcinoma of skin of unspecified parts of face: Secondary | ICD-10-CM | POA: Diagnosis not present

## 2015-03-21 DIAGNOSIS — E785 Hyperlipidemia, unspecified: Secondary | ICD-10-CM | POA: Diagnosis not present

## 2015-03-21 DIAGNOSIS — I10 Essential (primary) hypertension: Secondary | ICD-10-CM | POA: Diagnosis not present

## 2015-03-21 DIAGNOSIS — E119 Type 2 diabetes mellitus without complications: Secondary | ICD-10-CM | POA: Diagnosis not present

## 2015-03-21 DIAGNOSIS — C4362 Malignant melanoma of left upper limb, including shoulder: Secondary | ICD-10-CM | POA: Diagnosis not present

## 2015-03-21 DIAGNOSIS — C4359 Malignant melanoma of other part of trunk: Secondary | ICD-10-CM | POA: Diagnosis not present

## 2015-03-21 DIAGNOSIS — Z483 Aftercare following surgery for neoplasm: Secondary | ICD-10-CM | POA: Diagnosis not present

## 2015-03-21 DIAGNOSIS — C44320 Squamous cell carcinoma of skin of unspecified parts of face: Secondary | ICD-10-CM | POA: Diagnosis not present

## 2015-03-21 DIAGNOSIS — C4442 Squamous cell carcinoma of skin of scalp and neck: Secondary | ICD-10-CM | POA: Diagnosis not present

## 2015-03-23 DIAGNOSIS — C44320 Squamous cell carcinoma of skin of unspecified parts of face: Secondary | ICD-10-CM | POA: Diagnosis not present

## 2015-03-23 DIAGNOSIS — C4359 Malignant melanoma of other part of trunk: Secondary | ICD-10-CM | POA: Diagnosis not present

## 2015-03-23 DIAGNOSIS — E119 Type 2 diabetes mellitus without complications: Secondary | ICD-10-CM | POA: Diagnosis not present

## 2015-03-23 DIAGNOSIS — Z483 Aftercare following surgery for neoplasm: Secondary | ICD-10-CM | POA: Diagnosis not present

## 2015-03-23 DIAGNOSIS — E785 Hyperlipidemia, unspecified: Secondary | ICD-10-CM | POA: Diagnosis not present

## 2015-03-23 DIAGNOSIS — C4362 Malignant melanoma of left upper limb, including shoulder: Secondary | ICD-10-CM | POA: Diagnosis not present

## 2015-03-23 DIAGNOSIS — C4442 Squamous cell carcinoma of skin of scalp and neck: Secondary | ICD-10-CM | POA: Diagnosis not present

## 2015-03-23 DIAGNOSIS — I10 Essential (primary) hypertension: Secondary | ICD-10-CM | POA: Diagnosis not present

## 2015-03-26 DIAGNOSIS — E785 Hyperlipidemia, unspecified: Secondary | ICD-10-CM | POA: Diagnosis not present

## 2015-03-26 DIAGNOSIS — C4442 Squamous cell carcinoma of skin of scalp and neck: Secondary | ICD-10-CM | POA: Diagnosis not present

## 2015-03-26 DIAGNOSIS — C44320 Squamous cell carcinoma of skin of unspecified parts of face: Secondary | ICD-10-CM | POA: Diagnosis not present

## 2015-03-26 DIAGNOSIS — Z483 Aftercare following surgery for neoplasm: Secondary | ICD-10-CM | POA: Diagnosis not present

## 2015-03-26 DIAGNOSIS — C4359 Malignant melanoma of other part of trunk: Secondary | ICD-10-CM | POA: Diagnosis not present

## 2015-03-26 DIAGNOSIS — E119 Type 2 diabetes mellitus without complications: Secondary | ICD-10-CM | POA: Diagnosis not present

## 2015-03-26 DIAGNOSIS — C4362 Malignant melanoma of left upper limb, including shoulder: Secondary | ICD-10-CM | POA: Diagnosis not present

## 2015-03-26 DIAGNOSIS — I10 Essential (primary) hypertension: Secondary | ICD-10-CM | POA: Diagnosis not present

## 2015-03-27 DIAGNOSIS — Z85828 Personal history of other malignant neoplasm of skin: Secondary | ICD-10-CM | POA: Diagnosis not present

## 2015-03-27 DIAGNOSIS — L988 Other specified disorders of the skin and subcutaneous tissue: Secondary | ICD-10-CM | POA: Diagnosis not present

## 2015-03-28 ENCOUNTER — Other Ambulatory Visit: Payer: Self-pay | Admitting: Family Medicine

## 2015-03-28 DIAGNOSIS — I10 Essential (primary) hypertension: Secondary | ICD-10-CM | POA: Diagnosis not present

## 2015-03-28 DIAGNOSIS — C4442 Squamous cell carcinoma of skin of scalp and neck: Secondary | ICD-10-CM | POA: Diagnosis not present

## 2015-03-28 DIAGNOSIS — C44320 Squamous cell carcinoma of skin of unspecified parts of face: Secondary | ICD-10-CM | POA: Diagnosis not present

## 2015-03-28 DIAGNOSIS — E119 Type 2 diabetes mellitus without complications: Secondary | ICD-10-CM | POA: Diagnosis not present

## 2015-03-28 DIAGNOSIS — Z483 Aftercare following surgery for neoplasm: Secondary | ICD-10-CM | POA: Diagnosis not present

## 2015-03-28 DIAGNOSIS — E785 Hyperlipidemia, unspecified: Secondary | ICD-10-CM | POA: Diagnosis not present

## 2015-03-28 DIAGNOSIS — C4362 Malignant melanoma of left upper limb, including shoulder: Secondary | ICD-10-CM | POA: Diagnosis not present

## 2015-03-28 DIAGNOSIS — C4359 Malignant melanoma of other part of trunk: Secondary | ICD-10-CM | POA: Diagnosis not present

## 2015-03-29 ENCOUNTER — Other Ambulatory Visit: Payer: Self-pay | Admitting: Family Medicine

## 2015-03-30 DIAGNOSIS — E785 Hyperlipidemia, unspecified: Secondary | ICD-10-CM | POA: Diagnosis not present

## 2015-03-30 DIAGNOSIS — C4362 Malignant melanoma of left upper limb, including shoulder: Secondary | ICD-10-CM | POA: Diagnosis not present

## 2015-03-30 DIAGNOSIS — C4359 Malignant melanoma of other part of trunk: Secondary | ICD-10-CM | POA: Diagnosis not present

## 2015-03-30 DIAGNOSIS — Z483 Aftercare following surgery for neoplasm: Secondary | ICD-10-CM | POA: Diagnosis not present

## 2015-03-30 DIAGNOSIS — I10 Essential (primary) hypertension: Secondary | ICD-10-CM | POA: Diagnosis not present

## 2015-03-30 DIAGNOSIS — C4442 Squamous cell carcinoma of skin of scalp and neck: Secondary | ICD-10-CM | POA: Diagnosis not present

## 2015-03-30 DIAGNOSIS — E119 Type 2 diabetes mellitus without complications: Secondary | ICD-10-CM | POA: Diagnosis not present

## 2015-03-30 DIAGNOSIS — C44320 Squamous cell carcinoma of skin of unspecified parts of face: Secondary | ICD-10-CM | POA: Diagnosis not present

## 2015-04-02 DIAGNOSIS — C4442 Squamous cell carcinoma of skin of scalp and neck: Secondary | ICD-10-CM | POA: Diagnosis not present

## 2015-04-02 DIAGNOSIS — Z483 Aftercare following surgery for neoplasm: Secondary | ICD-10-CM | POA: Diagnosis not present

## 2015-04-02 DIAGNOSIS — C4362 Malignant melanoma of left upper limb, including shoulder: Secondary | ICD-10-CM | POA: Diagnosis not present

## 2015-04-02 DIAGNOSIS — I10 Essential (primary) hypertension: Secondary | ICD-10-CM | POA: Diagnosis not present

## 2015-04-02 DIAGNOSIS — E119 Type 2 diabetes mellitus without complications: Secondary | ICD-10-CM | POA: Diagnosis not present

## 2015-04-02 DIAGNOSIS — C4359 Malignant melanoma of other part of trunk: Secondary | ICD-10-CM | POA: Diagnosis not present

## 2015-04-02 DIAGNOSIS — E785 Hyperlipidemia, unspecified: Secondary | ICD-10-CM | POA: Diagnosis not present

## 2015-04-02 DIAGNOSIS — C44320 Squamous cell carcinoma of skin of unspecified parts of face: Secondary | ICD-10-CM | POA: Diagnosis not present

## 2015-04-03 ENCOUNTER — Other Ambulatory Visit: Payer: Self-pay | Admitting: Family Medicine

## 2015-04-04 DIAGNOSIS — C44320 Squamous cell carcinoma of skin of unspecified parts of face: Secondary | ICD-10-CM | POA: Diagnosis not present

## 2015-04-04 DIAGNOSIS — C4442 Squamous cell carcinoma of skin of scalp and neck: Secondary | ICD-10-CM | POA: Diagnosis not present

## 2015-04-04 DIAGNOSIS — C4362 Malignant melanoma of left upper limb, including shoulder: Secondary | ICD-10-CM | POA: Diagnosis not present

## 2015-04-04 DIAGNOSIS — E119 Type 2 diabetes mellitus without complications: Secondary | ICD-10-CM | POA: Diagnosis not present

## 2015-04-04 DIAGNOSIS — E785 Hyperlipidemia, unspecified: Secondary | ICD-10-CM | POA: Diagnosis not present

## 2015-04-04 DIAGNOSIS — Z483 Aftercare following surgery for neoplasm: Secondary | ICD-10-CM | POA: Diagnosis not present

## 2015-04-04 DIAGNOSIS — I10 Essential (primary) hypertension: Secondary | ICD-10-CM | POA: Diagnosis not present

## 2015-04-04 DIAGNOSIS — C4359 Malignant melanoma of other part of trunk: Secondary | ICD-10-CM | POA: Diagnosis not present

## 2015-04-06 DIAGNOSIS — C4442 Squamous cell carcinoma of skin of scalp and neck: Secondary | ICD-10-CM | POA: Diagnosis not present

## 2015-04-06 DIAGNOSIS — E119 Type 2 diabetes mellitus without complications: Secondary | ICD-10-CM | POA: Diagnosis not present

## 2015-04-06 DIAGNOSIS — Z483 Aftercare following surgery for neoplasm: Secondary | ICD-10-CM | POA: Diagnosis not present

## 2015-04-06 DIAGNOSIS — E785 Hyperlipidemia, unspecified: Secondary | ICD-10-CM | POA: Diagnosis not present

## 2015-04-06 DIAGNOSIS — C4362 Malignant melanoma of left upper limb, including shoulder: Secondary | ICD-10-CM | POA: Diagnosis not present

## 2015-04-06 DIAGNOSIS — C4359 Malignant melanoma of other part of trunk: Secondary | ICD-10-CM | POA: Diagnosis not present

## 2015-04-06 DIAGNOSIS — I10 Essential (primary) hypertension: Secondary | ICD-10-CM | POA: Diagnosis not present

## 2015-04-06 DIAGNOSIS — C44320 Squamous cell carcinoma of skin of unspecified parts of face: Secondary | ICD-10-CM | POA: Diagnosis not present

## 2015-04-09 DIAGNOSIS — C4362 Malignant melanoma of left upper limb, including shoulder: Secondary | ICD-10-CM | POA: Diagnosis not present

## 2015-04-09 DIAGNOSIS — C4359 Malignant melanoma of other part of trunk: Secondary | ICD-10-CM | POA: Diagnosis not present

## 2015-04-09 DIAGNOSIS — E785 Hyperlipidemia, unspecified: Secondary | ICD-10-CM | POA: Diagnosis not present

## 2015-04-09 DIAGNOSIS — C44320 Squamous cell carcinoma of skin of unspecified parts of face: Secondary | ICD-10-CM | POA: Diagnosis not present

## 2015-04-09 DIAGNOSIS — E119 Type 2 diabetes mellitus without complications: Secondary | ICD-10-CM | POA: Diagnosis not present

## 2015-04-09 DIAGNOSIS — Z483 Aftercare following surgery for neoplasm: Secondary | ICD-10-CM | POA: Diagnosis not present

## 2015-04-09 DIAGNOSIS — I10 Essential (primary) hypertension: Secondary | ICD-10-CM | POA: Diagnosis not present

## 2015-04-09 DIAGNOSIS — C4442 Squamous cell carcinoma of skin of scalp and neck: Secondary | ICD-10-CM | POA: Diagnosis not present

## 2015-04-11 DIAGNOSIS — C4362 Malignant melanoma of left upper limb, including shoulder: Secondary | ICD-10-CM | POA: Diagnosis not present

## 2015-04-11 DIAGNOSIS — E119 Type 2 diabetes mellitus without complications: Secondary | ICD-10-CM | POA: Diagnosis not present

## 2015-04-11 DIAGNOSIS — E785 Hyperlipidemia, unspecified: Secondary | ICD-10-CM | POA: Diagnosis not present

## 2015-04-11 DIAGNOSIS — C4442 Squamous cell carcinoma of skin of scalp and neck: Secondary | ICD-10-CM | POA: Diagnosis not present

## 2015-04-11 DIAGNOSIS — Z483 Aftercare following surgery for neoplasm: Secondary | ICD-10-CM | POA: Diagnosis not present

## 2015-04-11 DIAGNOSIS — C44320 Squamous cell carcinoma of skin of unspecified parts of face: Secondary | ICD-10-CM | POA: Diagnosis not present

## 2015-04-11 DIAGNOSIS — C4359 Malignant melanoma of other part of trunk: Secondary | ICD-10-CM | POA: Diagnosis not present

## 2015-04-11 DIAGNOSIS — I10 Essential (primary) hypertension: Secondary | ICD-10-CM | POA: Diagnosis not present

## 2015-04-13 DIAGNOSIS — C44320 Squamous cell carcinoma of skin of unspecified parts of face: Secondary | ICD-10-CM | POA: Diagnosis not present

## 2015-04-13 DIAGNOSIS — C4359 Malignant melanoma of other part of trunk: Secondary | ICD-10-CM | POA: Diagnosis not present

## 2015-04-13 DIAGNOSIS — E785 Hyperlipidemia, unspecified: Secondary | ICD-10-CM | POA: Diagnosis not present

## 2015-04-13 DIAGNOSIS — I10 Essential (primary) hypertension: Secondary | ICD-10-CM | POA: Diagnosis not present

## 2015-04-13 DIAGNOSIS — Z483 Aftercare following surgery for neoplasm: Secondary | ICD-10-CM | POA: Diagnosis not present

## 2015-04-13 DIAGNOSIS — C4442 Squamous cell carcinoma of skin of scalp and neck: Secondary | ICD-10-CM | POA: Diagnosis not present

## 2015-04-13 DIAGNOSIS — C4362 Malignant melanoma of left upper limb, including shoulder: Secondary | ICD-10-CM | POA: Diagnosis not present

## 2015-04-13 DIAGNOSIS — E119 Type 2 diabetes mellitus without complications: Secondary | ICD-10-CM | POA: Diagnosis not present

## 2015-04-16 ENCOUNTER — Other Ambulatory Visit: Payer: Self-pay | Admitting: Family Medicine

## 2015-04-16 DIAGNOSIS — C4362 Malignant melanoma of left upper limb, including shoulder: Secondary | ICD-10-CM | POA: Diagnosis not present

## 2015-04-16 DIAGNOSIS — E785 Hyperlipidemia, unspecified: Secondary | ICD-10-CM | POA: Diagnosis not present

## 2015-04-16 DIAGNOSIS — C4359 Malignant melanoma of other part of trunk: Secondary | ICD-10-CM | POA: Diagnosis not present

## 2015-04-16 DIAGNOSIS — I10 Essential (primary) hypertension: Secondary | ICD-10-CM | POA: Diagnosis not present

## 2015-04-16 DIAGNOSIS — C4442 Squamous cell carcinoma of skin of scalp and neck: Secondary | ICD-10-CM | POA: Diagnosis not present

## 2015-04-16 DIAGNOSIS — C44320 Squamous cell carcinoma of skin of unspecified parts of face: Secondary | ICD-10-CM | POA: Diagnosis not present

## 2015-04-16 DIAGNOSIS — E119 Type 2 diabetes mellitus without complications: Secondary | ICD-10-CM | POA: Diagnosis not present

## 2015-04-16 DIAGNOSIS — Z483 Aftercare following surgery for neoplasm: Secondary | ICD-10-CM | POA: Diagnosis not present

## 2015-04-18 DIAGNOSIS — C4362 Malignant melanoma of left upper limb, including shoulder: Secondary | ICD-10-CM | POA: Diagnosis not present

## 2015-04-18 DIAGNOSIS — E785 Hyperlipidemia, unspecified: Secondary | ICD-10-CM | POA: Diagnosis not present

## 2015-04-18 DIAGNOSIS — C4359 Malignant melanoma of other part of trunk: Secondary | ICD-10-CM | POA: Diagnosis not present

## 2015-04-18 DIAGNOSIS — E119 Type 2 diabetes mellitus without complications: Secondary | ICD-10-CM | POA: Diagnosis not present

## 2015-04-18 DIAGNOSIS — C44320 Squamous cell carcinoma of skin of unspecified parts of face: Secondary | ICD-10-CM | POA: Diagnosis not present

## 2015-04-18 DIAGNOSIS — Z483 Aftercare following surgery for neoplasm: Secondary | ICD-10-CM | POA: Diagnosis not present

## 2015-04-18 DIAGNOSIS — C4442 Squamous cell carcinoma of skin of scalp and neck: Secondary | ICD-10-CM | POA: Diagnosis not present

## 2015-04-18 DIAGNOSIS — I10 Essential (primary) hypertension: Secondary | ICD-10-CM | POA: Diagnosis not present

## 2015-04-20 DIAGNOSIS — E785 Hyperlipidemia, unspecified: Secondary | ICD-10-CM | POA: Diagnosis not present

## 2015-04-20 DIAGNOSIS — Z483 Aftercare following surgery for neoplasm: Secondary | ICD-10-CM | POA: Diagnosis not present

## 2015-04-20 DIAGNOSIS — C4359 Malignant melanoma of other part of trunk: Secondary | ICD-10-CM | POA: Diagnosis not present

## 2015-04-20 DIAGNOSIS — C44320 Squamous cell carcinoma of skin of unspecified parts of face: Secondary | ICD-10-CM | POA: Diagnosis not present

## 2015-04-20 DIAGNOSIS — C4442 Squamous cell carcinoma of skin of scalp and neck: Secondary | ICD-10-CM | POA: Diagnosis not present

## 2015-04-20 DIAGNOSIS — E119 Type 2 diabetes mellitus without complications: Secondary | ICD-10-CM | POA: Diagnosis not present

## 2015-04-20 DIAGNOSIS — I10 Essential (primary) hypertension: Secondary | ICD-10-CM | POA: Diagnosis not present

## 2015-04-20 DIAGNOSIS — C4362 Malignant melanoma of left upper limb, including shoulder: Secondary | ICD-10-CM | POA: Diagnosis not present

## 2015-04-23 DIAGNOSIS — I10 Essential (primary) hypertension: Secondary | ICD-10-CM | POA: Diagnosis not present

## 2015-04-23 DIAGNOSIS — Z483 Aftercare following surgery for neoplasm: Secondary | ICD-10-CM | POA: Diagnosis not present

## 2015-04-23 DIAGNOSIS — C4362 Malignant melanoma of left upper limb, including shoulder: Secondary | ICD-10-CM | POA: Diagnosis not present

## 2015-04-23 DIAGNOSIS — C4442 Squamous cell carcinoma of skin of scalp and neck: Secondary | ICD-10-CM | POA: Diagnosis not present

## 2015-04-23 DIAGNOSIS — E119 Type 2 diabetes mellitus without complications: Secondary | ICD-10-CM | POA: Diagnosis not present

## 2015-04-23 DIAGNOSIS — C4359 Malignant melanoma of other part of trunk: Secondary | ICD-10-CM | POA: Diagnosis not present

## 2015-04-23 DIAGNOSIS — E785 Hyperlipidemia, unspecified: Secondary | ICD-10-CM | POA: Diagnosis not present

## 2015-04-23 DIAGNOSIS — C44320 Squamous cell carcinoma of skin of unspecified parts of face: Secondary | ICD-10-CM | POA: Diagnosis not present

## 2015-04-25 DIAGNOSIS — S41001A Unspecified open wound of right shoulder, initial encounter: Secondary | ICD-10-CM | POA: Diagnosis not present

## 2015-04-25 DIAGNOSIS — Z483 Aftercare following surgery for neoplasm: Secondary | ICD-10-CM | POA: Diagnosis not present

## 2015-04-25 DIAGNOSIS — C4359 Malignant melanoma of other part of trunk: Secondary | ICD-10-CM | POA: Diagnosis not present

## 2015-04-25 DIAGNOSIS — C44320 Squamous cell carcinoma of skin of unspecified parts of face: Secondary | ICD-10-CM | POA: Diagnosis not present

## 2015-04-25 DIAGNOSIS — E785 Hyperlipidemia, unspecified: Secondary | ICD-10-CM | POA: Diagnosis not present

## 2015-04-25 DIAGNOSIS — I10 Essential (primary) hypertension: Secondary | ICD-10-CM | POA: Diagnosis not present

## 2015-04-25 DIAGNOSIS — C4442 Squamous cell carcinoma of skin of scalp and neck: Secondary | ICD-10-CM | POA: Diagnosis not present

## 2015-04-25 DIAGNOSIS — C4362 Malignant melanoma of left upper limb, including shoulder: Secondary | ICD-10-CM | POA: Diagnosis not present

## 2015-04-25 DIAGNOSIS — E119 Type 2 diabetes mellitus without complications: Secondary | ICD-10-CM | POA: Diagnosis not present

## 2015-04-27 DIAGNOSIS — E119 Type 2 diabetes mellitus without complications: Secondary | ICD-10-CM | POA: Diagnosis not present

## 2015-04-27 DIAGNOSIS — C4362 Malignant melanoma of left upper limb, including shoulder: Secondary | ICD-10-CM | POA: Diagnosis not present

## 2015-04-27 DIAGNOSIS — Z483 Aftercare following surgery for neoplasm: Secondary | ICD-10-CM | POA: Diagnosis not present

## 2015-04-27 DIAGNOSIS — C44320 Squamous cell carcinoma of skin of unspecified parts of face: Secondary | ICD-10-CM | POA: Diagnosis not present

## 2015-04-27 DIAGNOSIS — I10 Essential (primary) hypertension: Secondary | ICD-10-CM | POA: Diagnosis not present

## 2015-04-27 DIAGNOSIS — C4442 Squamous cell carcinoma of skin of scalp and neck: Secondary | ICD-10-CM | POA: Diagnosis not present

## 2015-04-27 DIAGNOSIS — C4359 Malignant melanoma of other part of trunk: Secondary | ICD-10-CM | POA: Diagnosis not present

## 2015-04-27 DIAGNOSIS — E785 Hyperlipidemia, unspecified: Secondary | ICD-10-CM | POA: Diagnosis not present

## 2015-05-02 DIAGNOSIS — I10 Essential (primary) hypertension: Secondary | ICD-10-CM | POA: Diagnosis not present

## 2015-05-02 DIAGNOSIS — Z483 Aftercare following surgery for neoplasm: Secondary | ICD-10-CM | POA: Diagnosis not present

## 2015-05-02 DIAGNOSIS — C4359 Malignant melanoma of other part of trunk: Secondary | ICD-10-CM | POA: Diagnosis not present

## 2015-05-02 DIAGNOSIS — E119 Type 2 diabetes mellitus without complications: Secondary | ICD-10-CM | POA: Diagnosis not present

## 2015-05-02 DIAGNOSIS — C44320 Squamous cell carcinoma of skin of unspecified parts of face: Secondary | ICD-10-CM | POA: Diagnosis not present

## 2015-05-02 DIAGNOSIS — E785 Hyperlipidemia, unspecified: Secondary | ICD-10-CM | POA: Diagnosis not present

## 2015-05-02 DIAGNOSIS — C4362 Malignant melanoma of left upper limb, including shoulder: Secondary | ICD-10-CM | POA: Diagnosis not present

## 2015-05-02 DIAGNOSIS — C4442 Squamous cell carcinoma of skin of scalp and neck: Secondary | ICD-10-CM | POA: Diagnosis not present

## 2015-05-03 ENCOUNTER — Other Ambulatory Visit: Payer: Self-pay | Admitting: *Deleted

## 2015-05-03 DIAGNOSIS — I10 Essential (primary) hypertension: Secondary | ICD-10-CM

## 2015-05-03 MED ORDER — FELODIPINE ER 2.5 MG PO TB24: 2.5000 mg | ORAL_TABLET | Freq: Every day | ORAL | Status: DC

## 2015-05-03 NOTE — Telephone Encounter (Signed)
Requesting 90 day supply.

## 2015-05-04 DIAGNOSIS — Z483 Aftercare following surgery for neoplasm: Secondary | ICD-10-CM | POA: Diagnosis not present

## 2015-05-04 DIAGNOSIS — C4362 Malignant melanoma of left upper limb, including shoulder: Secondary | ICD-10-CM | POA: Diagnosis not present

## 2015-05-04 DIAGNOSIS — C4442 Squamous cell carcinoma of skin of scalp and neck: Secondary | ICD-10-CM | POA: Diagnosis not present

## 2015-05-04 DIAGNOSIS — I10 Essential (primary) hypertension: Secondary | ICD-10-CM | POA: Diagnosis not present

## 2015-05-04 DIAGNOSIS — C44320 Squamous cell carcinoma of skin of unspecified parts of face: Secondary | ICD-10-CM | POA: Diagnosis not present

## 2015-05-04 DIAGNOSIS — E785 Hyperlipidemia, unspecified: Secondary | ICD-10-CM | POA: Diagnosis not present

## 2015-05-04 DIAGNOSIS — C4359 Malignant melanoma of other part of trunk: Secondary | ICD-10-CM | POA: Diagnosis not present

## 2015-05-04 DIAGNOSIS — E119 Type 2 diabetes mellitus without complications: Secondary | ICD-10-CM | POA: Diagnosis not present

## 2015-05-08 DIAGNOSIS — E785 Hyperlipidemia, unspecified: Secondary | ICD-10-CM | POA: Diagnosis not present

## 2015-05-08 DIAGNOSIS — C4359 Malignant melanoma of other part of trunk: Secondary | ICD-10-CM | POA: Diagnosis not present

## 2015-05-08 DIAGNOSIS — C4442 Squamous cell carcinoma of skin of scalp and neck: Secondary | ICD-10-CM | POA: Diagnosis not present

## 2015-05-08 DIAGNOSIS — Z483 Aftercare following surgery for neoplasm: Secondary | ICD-10-CM | POA: Diagnosis not present

## 2015-05-08 DIAGNOSIS — E119 Type 2 diabetes mellitus without complications: Secondary | ICD-10-CM | POA: Diagnosis not present

## 2015-05-08 DIAGNOSIS — C44320 Squamous cell carcinoma of skin of unspecified parts of face: Secondary | ICD-10-CM | POA: Diagnosis not present

## 2015-05-08 DIAGNOSIS — I10 Essential (primary) hypertension: Secondary | ICD-10-CM | POA: Diagnosis not present

## 2015-05-08 DIAGNOSIS — C4362 Malignant melanoma of left upper limb, including shoulder: Secondary | ICD-10-CM | POA: Diagnosis not present

## 2015-05-09 DIAGNOSIS — L988 Other specified disorders of the skin and subcutaneous tissue: Secondary | ICD-10-CM | POA: Diagnosis not present

## 2015-05-09 DIAGNOSIS — Z85828 Personal history of other malignant neoplasm of skin: Secondary | ICD-10-CM | POA: Diagnosis not present

## 2015-05-11 DIAGNOSIS — C4362 Malignant melanoma of left upper limb, including shoulder: Secondary | ICD-10-CM | POA: Diagnosis not present

## 2015-05-11 DIAGNOSIS — C44320 Squamous cell carcinoma of skin of unspecified parts of face: Secondary | ICD-10-CM | POA: Diagnosis not present

## 2015-05-11 DIAGNOSIS — Z483 Aftercare following surgery for neoplasm: Secondary | ICD-10-CM | POA: Diagnosis not present

## 2015-05-11 DIAGNOSIS — I10 Essential (primary) hypertension: Secondary | ICD-10-CM | POA: Diagnosis not present

## 2015-05-11 DIAGNOSIS — C4442 Squamous cell carcinoma of skin of scalp and neck: Secondary | ICD-10-CM | POA: Diagnosis not present

## 2015-05-11 DIAGNOSIS — E785 Hyperlipidemia, unspecified: Secondary | ICD-10-CM | POA: Diagnosis not present

## 2015-05-11 DIAGNOSIS — E119 Type 2 diabetes mellitus without complications: Secondary | ICD-10-CM | POA: Diagnosis not present

## 2015-05-11 DIAGNOSIS — C4359 Malignant melanoma of other part of trunk: Secondary | ICD-10-CM | POA: Diagnosis not present

## 2015-05-16 DIAGNOSIS — C4442 Squamous cell carcinoma of skin of scalp and neck: Secondary | ICD-10-CM | POA: Diagnosis not present

## 2015-05-16 DIAGNOSIS — C4359 Malignant melanoma of other part of trunk: Secondary | ICD-10-CM | POA: Diagnosis not present

## 2015-05-16 DIAGNOSIS — E785 Hyperlipidemia, unspecified: Secondary | ICD-10-CM | POA: Diagnosis not present

## 2015-05-16 DIAGNOSIS — I10 Essential (primary) hypertension: Secondary | ICD-10-CM | POA: Diagnosis not present

## 2015-05-16 DIAGNOSIS — Z483 Aftercare following surgery for neoplasm: Secondary | ICD-10-CM | POA: Diagnosis not present

## 2015-05-16 DIAGNOSIS — C4362 Malignant melanoma of left upper limb, including shoulder: Secondary | ICD-10-CM | POA: Diagnosis not present

## 2015-05-16 DIAGNOSIS — E119 Type 2 diabetes mellitus without complications: Secondary | ICD-10-CM | POA: Diagnosis not present

## 2015-05-16 DIAGNOSIS — C44320 Squamous cell carcinoma of skin of unspecified parts of face: Secondary | ICD-10-CM | POA: Diagnosis not present

## 2015-05-18 DIAGNOSIS — Z483 Aftercare following surgery for neoplasm: Secondary | ICD-10-CM | POA: Diagnosis not present

## 2015-05-18 DIAGNOSIS — E785 Hyperlipidemia, unspecified: Secondary | ICD-10-CM | POA: Diagnosis not present

## 2015-05-18 DIAGNOSIS — C4442 Squamous cell carcinoma of skin of scalp and neck: Secondary | ICD-10-CM | POA: Diagnosis not present

## 2015-05-18 DIAGNOSIS — C44320 Squamous cell carcinoma of skin of unspecified parts of face: Secondary | ICD-10-CM | POA: Diagnosis not present

## 2015-05-18 DIAGNOSIS — I10 Essential (primary) hypertension: Secondary | ICD-10-CM | POA: Diagnosis not present

## 2015-05-18 DIAGNOSIS — C4359 Malignant melanoma of other part of trunk: Secondary | ICD-10-CM | POA: Diagnosis not present

## 2015-05-18 DIAGNOSIS — C4362 Malignant melanoma of left upper limb, including shoulder: Secondary | ICD-10-CM | POA: Diagnosis not present

## 2015-05-18 DIAGNOSIS — E119 Type 2 diabetes mellitus without complications: Secondary | ICD-10-CM | POA: Diagnosis not present

## 2015-05-21 DIAGNOSIS — C4359 Malignant melanoma of other part of trunk: Secondary | ICD-10-CM | POA: Diagnosis not present

## 2015-05-21 DIAGNOSIS — E785 Hyperlipidemia, unspecified: Secondary | ICD-10-CM | POA: Diagnosis not present

## 2015-05-21 DIAGNOSIS — E119 Type 2 diabetes mellitus without complications: Secondary | ICD-10-CM | POA: Diagnosis not present

## 2015-05-21 DIAGNOSIS — C44320 Squamous cell carcinoma of skin of unspecified parts of face: Secondary | ICD-10-CM | POA: Diagnosis not present

## 2015-05-21 DIAGNOSIS — Z483 Aftercare following surgery for neoplasm: Secondary | ICD-10-CM | POA: Diagnosis not present

## 2015-05-21 DIAGNOSIS — C4442 Squamous cell carcinoma of skin of scalp and neck: Secondary | ICD-10-CM | POA: Diagnosis not present

## 2015-05-21 DIAGNOSIS — C4362 Malignant melanoma of left upper limb, including shoulder: Secondary | ICD-10-CM | POA: Diagnosis not present

## 2015-05-21 DIAGNOSIS — I10 Essential (primary) hypertension: Secondary | ICD-10-CM | POA: Diagnosis not present

## 2015-05-23 DIAGNOSIS — C4442 Squamous cell carcinoma of skin of scalp and neck: Secondary | ICD-10-CM | POA: Diagnosis not present

## 2015-05-23 DIAGNOSIS — E785 Hyperlipidemia, unspecified: Secondary | ICD-10-CM | POA: Diagnosis not present

## 2015-05-23 DIAGNOSIS — I10 Essential (primary) hypertension: Secondary | ICD-10-CM | POA: Diagnosis not present

## 2015-05-23 DIAGNOSIS — C44320 Squamous cell carcinoma of skin of unspecified parts of face: Secondary | ICD-10-CM | POA: Diagnosis not present

## 2015-05-23 DIAGNOSIS — Z483 Aftercare following surgery for neoplasm: Secondary | ICD-10-CM | POA: Diagnosis not present

## 2015-05-23 DIAGNOSIS — C4362 Malignant melanoma of left upper limb, including shoulder: Secondary | ICD-10-CM | POA: Diagnosis not present

## 2015-05-23 DIAGNOSIS — E119 Type 2 diabetes mellitus without complications: Secondary | ICD-10-CM | POA: Diagnosis not present

## 2015-05-23 DIAGNOSIS — C4359 Malignant melanoma of other part of trunk: Secondary | ICD-10-CM | POA: Diagnosis not present

## 2015-05-25 DIAGNOSIS — C44320 Squamous cell carcinoma of skin of unspecified parts of face: Secondary | ICD-10-CM | POA: Diagnosis not present

## 2015-05-25 DIAGNOSIS — C4359 Malignant melanoma of other part of trunk: Secondary | ICD-10-CM | POA: Diagnosis not present

## 2015-05-25 DIAGNOSIS — E785 Hyperlipidemia, unspecified: Secondary | ICD-10-CM | POA: Diagnosis not present

## 2015-05-25 DIAGNOSIS — Z483 Aftercare following surgery for neoplasm: Secondary | ICD-10-CM | POA: Diagnosis not present

## 2015-05-25 DIAGNOSIS — C4442 Squamous cell carcinoma of skin of scalp and neck: Secondary | ICD-10-CM | POA: Diagnosis not present

## 2015-05-25 DIAGNOSIS — I10 Essential (primary) hypertension: Secondary | ICD-10-CM | POA: Diagnosis not present

## 2015-05-25 DIAGNOSIS — E119 Type 2 diabetes mellitus without complications: Secondary | ICD-10-CM | POA: Diagnosis not present

## 2015-05-25 DIAGNOSIS — C4362 Malignant melanoma of left upper limb, including shoulder: Secondary | ICD-10-CM | POA: Diagnosis not present

## 2015-05-28 DIAGNOSIS — E785 Hyperlipidemia, unspecified: Secondary | ICD-10-CM | POA: Diagnosis not present

## 2015-05-28 DIAGNOSIS — C44320 Squamous cell carcinoma of skin of unspecified parts of face: Secondary | ICD-10-CM | POA: Diagnosis not present

## 2015-05-28 DIAGNOSIS — I10 Essential (primary) hypertension: Secondary | ICD-10-CM | POA: Diagnosis not present

## 2015-05-28 DIAGNOSIS — C4359 Malignant melanoma of other part of trunk: Secondary | ICD-10-CM | POA: Diagnosis not present

## 2015-05-28 DIAGNOSIS — Z483 Aftercare following surgery for neoplasm: Secondary | ICD-10-CM | POA: Diagnosis not present

## 2015-05-28 DIAGNOSIS — C4362 Malignant melanoma of left upper limb, including shoulder: Secondary | ICD-10-CM | POA: Diagnosis not present

## 2015-05-28 DIAGNOSIS — C4442 Squamous cell carcinoma of skin of scalp and neck: Secondary | ICD-10-CM | POA: Diagnosis not present

## 2015-05-28 DIAGNOSIS — E119 Type 2 diabetes mellitus without complications: Secondary | ICD-10-CM | POA: Diagnosis not present

## 2015-05-30 DIAGNOSIS — C4442 Squamous cell carcinoma of skin of scalp and neck: Secondary | ICD-10-CM | POA: Diagnosis not present

## 2015-05-30 DIAGNOSIS — C4359 Malignant melanoma of other part of trunk: Secondary | ICD-10-CM | POA: Diagnosis not present

## 2015-05-30 DIAGNOSIS — C4362 Malignant melanoma of left upper limb, including shoulder: Secondary | ICD-10-CM | POA: Diagnosis not present

## 2015-05-30 DIAGNOSIS — I10 Essential (primary) hypertension: Secondary | ICD-10-CM | POA: Diagnosis not present

## 2015-05-30 DIAGNOSIS — Z483 Aftercare following surgery for neoplasm: Secondary | ICD-10-CM | POA: Diagnosis not present

## 2015-05-30 DIAGNOSIS — E119 Type 2 diabetes mellitus without complications: Secondary | ICD-10-CM | POA: Diagnosis not present

## 2015-05-30 DIAGNOSIS — C44320 Squamous cell carcinoma of skin of unspecified parts of face: Secondary | ICD-10-CM | POA: Diagnosis not present

## 2015-05-30 DIAGNOSIS — E785 Hyperlipidemia, unspecified: Secondary | ICD-10-CM | POA: Diagnosis not present

## 2015-06-01 DIAGNOSIS — C4362 Malignant melanoma of left upper limb, including shoulder: Secondary | ICD-10-CM | POA: Diagnosis not present

## 2015-06-01 DIAGNOSIS — C4442 Squamous cell carcinoma of skin of scalp and neck: Secondary | ICD-10-CM | POA: Diagnosis not present

## 2015-06-01 DIAGNOSIS — Z483 Aftercare following surgery for neoplasm: Secondary | ICD-10-CM | POA: Diagnosis not present

## 2015-06-01 DIAGNOSIS — E785 Hyperlipidemia, unspecified: Secondary | ICD-10-CM | POA: Diagnosis not present

## 2015-06-01 DIAGNOSIS — E119 Type 2 diabetes mellitus without complications: Secondary | ICD-10-CM | POA: Diagnosis not present

## 2015-06-01 DIAGNOSIS — I10 Essential (primary) hypertension: Secondary | ICD-10-CM | POA: Diagnosis not present

## 2015-06-01 DIAGNOSIS — C44320 Squamous cell carcinoma of skin of unspecified parts of face: Secondary | ICD-10-CM | POA: Diagnosis not present

## 2015-06-01 DIAGNOSIS — C4359 Malignant melanoma of other part of trunk: Secondary | ICD-10-CM | POA: Diagnosis not present

## 2015-06-04 DIAGNOSIS — I10 Essential (primary) hypertension: Secondary | ICD-10-CM | POA: Diagnosis not present

## 2015-06-04 DIAGNOSIS — C4442 Squamous cell carcinoma of skin of scalp and neck: Secondary | ICD-10-CM | POA: Diagnosis not present

## 2015-06-04 DIAGNOSIS — C44320 Squamous cell carcinoma of skin of unspecified parts of face: Secondary | ICD-10-CM | POA: Diagnosis not present

## 2015-06-04 DIAGNOSIS — E119 Type 2 diabetes mellitus without complications: Secondary | ICD-10-CM | POA: Diagnosis not present

## 2015-06-04 DIAGNOSIS — E785 Hyperlipidemia, unspecified: Secondary | ICD-10-CM | POA: Diagnosis not present

## 2015-06-04 DIAGNOSIS — C4362 Malignant melanoma of left upper limb, including shoulder: Secondary | ICD-10-CM | POA: Diagnosis not present

## 2015-06-04 DIAGNOSIS — C4359 Malignant melanoma of other part of trunk: Secondary | ICD-10-CM | POA: Diagnosis not present

## 2015-06-04 DIAGNOSIS — Z483 Aftercare following surgery for neoplasm: Secondary | ICD-10-CM | POA: Diagnosis not present

## 2015-06-06 DIAGNOSIS — Z483 Aftercare following surgery for neoplasm: Secondary | ICD-10-CM | POA: Diagnosis not present

## 2015-06-06 DIAGNOSIS — E119 Type 2 diabetes mellitus without complications: Secondary | ICD-10-CM | POA: Diagnosis not present

## 2015-06-06 DIAGNOSIS — C4362 Malignant melanoma of left upper limb, including shoulder: Secondary | ICD-10-CM | POA: Diagnosis not present

## 2015-06-06 DIAGNOSIS — C4442 Squamous cell carcinoma of skin of scalp and neck: Secondary | ICD-10-CM | POA: Diagnosis not present

## 2015-06-06 DIAGNOSIS — C4359 Malignant melanoma of other part of trunk: Secondary | ICD-10-CM | POA: Diagnosis not present

## 2015-06-06 DIAGNOSIS — E785 Hyperlipidemia, unspecified: Secondary | ICD-10-CM | POA: Diagnosis not present

## 2015-06-06 DIAGNOSIS — I10 Essential (primary) hypertension: Secondary | ICD-10-CM | POA: Diagnosis not present

## 2015-06-06 DIAGNOSIS — C44320 Squamous cell carcinoma of skin of unspecified parts of face: Secondary | ICD-10-CM | POA: Diagnosis not present

## 2015-06-08 DIAGNOSIS — C4362 Malignant melanoma of left upper limb, including shoulder: Secondary | ICD-10-CM | POA: Diagnosis not present

## 2015-06-08 DIAGNOSIS — Z483 Aftercare following surgery for neoplasm: Secondary | ICD-10-CM | POA: Diagnosis not present

## 2015-06-08 DIAGNOSIS — I10 Essential (primary) hypertension: Secondary | ICD-10-CM | POA: Diagnosis not present

## 2015-06-08 DIAGNOSIS — C4359 Malignant melanoma of other part of trunk: Secondary | ICD-10-CM | POA: Diagnosis not present

## 2015-06-08 DIAGNOSIS — E785 Hyperlipidemia, unspecified: Secondary | ICD-10-CM | POA: Diagnosis not present

## 2015-06-08 DIAGNOSIS — C44320 Squamous cell carcinoma of skin of unspecified parts of face: Secondary | ICD-10-CM | POA: Diagnosis not present

## 2015-06-08 DIAGNOSIS — C4442 Squamous cell carcinoma of skin of scalp and neck: Secondary | ICD-10-CM | POA: Diagnosis not present

## 2015-06-08 DIAGNOSIS — E119 Type 2 diabetes mellitus without complications: Secondary | ICD-10-CM | POA: Diagnosis not present

## 2015-06-11 DIAGNOSIS — C4442 Squamous cell carcinoma of skin of scalp and neck: Secondary | ICD-10-CM | POA: Diagnosis not present

## 2015-06-11 DIAGNOSIS — C4359 Malignant melanoma of other part of trunk: Secondary | ICD-10-CM | POA: Diagnosis not present

## 2015-06-11 DIAGNOSIS — I10 Essential (primary) hypertension: Secondary | ICD-10-CM | POA: Diagnosis not present

## 2015-06-11 DIAGNOSIS — Z483 Aftercare following surgery for neoplasm: Secondary | ICD-10-CM | POA: Diagnosis not present

## 2015-06-11 DIAGNOSIS — C4362 Malignant melanoma of left upper limb, including shoulder: Secondary | ICD-10-CM | POA: Diagnosis not present

## 2015-06-11 DIAGNOSIS — E785 Hyperlipidemia, unspecified: Secondary | ICD-10-CM | POA: Diagnosis not present

## 2015-06-11 DIAGNOSIS — E119 Type 2 diabetes mellitus without complications: Secondary | ICD-10-CM | POA: Diagnosis not present

## 2015-06-11 DIAGNOSIS — C44320 Squamous cell carcinoma of skin of unspecified parts of face: Secondary | ICD-10-CM | POA: Diagnosis not present

## 2015-06-13 DIAGNOSIS — Z483 Aftercare following surgery for neoplasm: Secondary | ICD-10-CM | POA: Diagnosis not present

## 2015-06-13 DIAGNOSIS — E119 Type 2 diabetes mellitus without complications: Secondary | ICD-10-CM | POA: Diagnosis not present

## 2015-06-13 DIAGNOSIS — E785 Hyperlipidemia, unspecified: Secondary | ICD-10-CM | POA: Diagnosis not present

## 2015-06-13 DIAGNOSIS — C44320 Squamous cell carcinoma of skin of unspecified parts of face: Secondary | ICD-10-CM | POA: Diagnosis not present

## 2015-06-13 DIAGNOSIS — C4442 Squamous cell carcinoma of skin of scalp and neck: Secondary | ICD-10-CM | POA: Diagnosis not present

## 2015-06-13 DIAGNOSIS — I10 Essential (primary) hypertension: Secondary | ICD-10-CM | POA: Diagnosis not present

## 2015-06-13 DIAGNOSIS — C4362 Malignant melanoma of left upper limb, including shoulder: Secondary | ICD-10-CM | POA: Diagnosis not present

## 2015-06-13 DIAGNOSIS — C4359 Malignant melanoma of other part of trunk: Secondary | ICD-10-CM | POA: Diagnosis not present

## 2015-06-15 DIAGNOSIS — Z483 Aftercare following surgery for neoplasm: Secondary | ICD-10-CM | POA: Diagnosis not present

## 2015-06-15 DIAGNOSIS — E785 Hyperlipidemia, unspecified: Secondary | ICD-10-CM | POA: Diagnosis not present

## 2015-06-15 DIAGNOSIS — C4359 Malignant melanoma of other part of trunk: Secondary | ICD-10-CM | POA: Diagnosis not present

## 2015-06-15 DIAGNOSIS — E119 Type 2 diabetes mellitus without complications: Secondary | ICD-10-CM | POA: Diagnosis not present

## 2015-06-15 DIAGNOSIS — C4362 Malignant melanoma of left upper limb, including shoulder: Secondary | ICD-10-CM | POA: Diagnosis not present

## 2015-06-15 DIAGNOSIS — C4442 Squamous cell carcinoma of skin of scalp and neck: Secondary | ICD-10-CM | POA: Diagnosis not present

## 2015-06-15 DIAGNOSIS — C44320 Squamous cell carcinoma of skin of unspecified parts of face: Secondary | ICD-10-CM | POA: Diagnosis not present

## 2015-06-15 DIAGNOSIS — I10 Essential (primary) hypertension: Secondary | ICD-10-CM | POA: Diagnosis not present

## 2015-06-18 DIAGNOSIS — C44320 Squamous cell carcinoma of skin of unspecified parts of face: Secondary | ICD-10-CM | POA: Diagnosis not present

## 2015-06-18 DIAGNOSIS — C4362 Malignant melanoma of left upper limb, including shoulder: Secondary | ICD-10-CM | POA: Diagnosis not present

## 2015-06-18 DIAGNOSIS — E785 Hyperlipidemia, unspecified: Secondary | ICD-10-CM | POA: Diagnosis not present

## 2015-06-18 DIAGNOSIS — C4442 Squamous cell carcinoma of skin of scalp and neck: Secondary | ICD-10-CM | POA: Diagnosis not present

## 2015-06-18 DIAGNOSIS — Z483 Aftercare following surgery for neoplasm: Secondary | ICD-10-CM | POA: Diagnosis not present

## 2015-06-18 DIAGNOSIS — E119 Type 2 diabetes mellitus without complications: Secondary | ICD-10-CM | POA: Diagnosis not present

## 2015-06-18 DIAGNOSIS — C4359 Malignant melanoma of other part of trunk: Secondary | ICD-10-CM | POA: Diagnosis not present

## 2015-06-18 DIAGNOSIS — I10 Essential (primary) hypertension: Secondary | ICD-10-CM | POA: Diagnosis not present

## 2015-06-20 DIAGNOSIS — I10 Essential (primary) hypertension: Secondary | ICD-10-CM | POA: Diagnosis not present

## 2015-06-20 DIAGNOSIS — C4362 Malignant melanoma of left upper limb, including shoulder: Secondary | ICD-10-CM | POA: Diagnosis not present

## 2015-06-20 DIAGNOSIS — C4359 Malignant melanoma of other part of trunk: Secondary | ICD-10-CM | POA: Diagnosis not present

## 2015-06-20 DIAGNOSIS — C4442 Squamous cell carcinoma of skin of scalp and neck: Secondary | ICD-10-CM | POA: Diagnosis not present

## 2015-06-20 DIAGNOSIS — C44320 Squamous cell carcinoma of skin of unspecified parts of face: Secondary | ICD-10-CM | POA: Diagnosis not present

## 2015-06-20 DIAGNOSIS — E119 Type 2 diabetes mellitus without complications: Secondary | ICD-10-CM | POA: Diagnosis not present

## 2015-06-20 DIAGNOSIS — E785 Hyperlipidemia, unspecified: Secondary | ICD-10-CM | POA: Diagnosis not present

## 2015-06-20 DIAGNOSIS — Z483 Aftercare following surgery for neoplasm: Secondary | ICD-10-CM | POA: Diagnosis not present

## 2015-06-22 DIAGNOSIS — C4359 Malignant melanoma of other part of trunk: Secondary | ICD-10-CM | POA: Diagnosis not present

## 2015-06-22 DIAGNOSIS — E785 Hyperlipidemia, unspecified: Secondary | ICD-10-CM | POA: Diagnosis not present

## 2015-06-22 DIAGNOSIS — C4442 Squamous cell carcinoma of skin of scalp and neck: Secondary | ICD-10-CM | POA: Diagnosis not present

## 2015-06-22 DIAGNOSIS — C4362 Malignant melanoma of left upper limb, including shoulder: Secondary | ICD-10-CM | POA: Diagnosis not present

## 2015-06-22 DIAGNOSIS — E119 Type 2 diabetes mellitus without complications: Secondary | ICD-10-CM | POA: Diagnosis not present

## 2015-06-22 DIAGNOSIS — Z483 Aftercare following surgery for neoplasm: Secondary | ICD-10-CM | POA: Diagnosis not present

## 2015-06-22 DIAGNOSIS — I10 Essential (primary) hypertension: Secondary | ICD-10-CM | POA: Diagnosis not present

## 2015-06-22 DIAGNOSIS — C44320 Squamous cell carcinoma of skin of unspecified parts of face: Secondary | ICD-10-CM | POA: Diagnosis not present

## 2015-06-25 DIAGNOSIS — C4359 Malignant melanoma of other part of trunk: Secondary | ICD-10-CM | POA: Diagnosis not present

## 2015-06-25 DIAGNOSIS — C4362 Malignant melanoma of left upper limb, including shoulder: Secondary | ICD-10-CM | POA: Diagnosis not present

## 2015-06-25 DIAGNOSIS — I10 Essential (primary) hypertension: Secondary | ICD-10-CM | POA: Diagnosis not present

## 2015-06-25 DIAGNOSIS — Z483 Aftercare following surgery for neoplasm: Secondary | ICD-10-CM | POA: Diagnosis not present

## 2015-06-25 DIAGNOSIS — C4442 Squamous cell carcinoma of skin of scalp and neck: Secondary | ICD-10-CM | POA: Diagnosis not present

## 2015-06-25 DIAGNOSIS — E785 Hyperlipidemia, unspecified: Secondary | ICD-10-CM | POA: Diagnosis not present

## 2015-06-25 DIAGNOSIS — C44320 Squamous cell carcinoma of skin of unspecified parts of face: Secondary | ICD-10-CM | POA: Diagnosis not present

## 2015-06-25 DIAGNOSIS — E119 Type 2 diabetes mellitus without complications: Secondary | ICD-10-CM | POA: Diagnosis not present

## 2015-06-27 DIAGNOSIS — C4442 Squamous cell carcinoma of skin of scalp and neck: Secondary | ICD-10-CM | POA: Diagnosis not present

## 2015-06-27 DIAGNOSIS — Z483 Aftercare following surgery for neoplasm: Secondary | ICD-10-CM | POA: Diagnosis not present

## 2015-06-27 DIAGNOSIS — C4362 Malignant melanoma of left upper limb, including shoulder: Secondary | ICD-10-CM | POA: Diagnosis not present

## 2015-06-27 DIAGNOSIS — C4359 Malignant melanoma of other part of trunk: Secondary | ICD-10-CM | POA: Diagnosis not present

## 2015-06-27 DIAGNOSIS — E785 Hyperlipidemia, unspecified: Secondary | ICD-10-CM | POA: Diagnosis not present

## 2015-06-27 DIAGNOSIS — E119 Type 2 diabetes mellitus without complications: Secondary | ICD-10-CM | POA: Diagnosis not present

## 2015-06-27 DIAGNOSIS — I10 Essential (primary) hypertension: Secondary | ICD-10-CM | POA: Diagnosis not present

## 2015-06-27 DIAGNOSIS — C44320 Squamous cell carcinoma of skin of unspecified parts of face: Secondary | ICD-10-CM | POA: Diagnosis not present

## 2015-06-29 DIAGNOSIS — E119 Type 2 diabetes mellitus without complications: Secondary | ICD-10-CM | POA: Diagnosis not present

## 2015-06-29 DIAGNOSIS — H40003 Preglaucoma, unspecified, bilateral: Secondary | ICD-10-CM | POA: Diagnosis not present

## 2015-06-29 DIAGNOSIS — C44320 Squamous cell carcinoma of skin of unspecified parts of face: Secondary | ICD-10-CM | POA: Diagnosis not present

## 2015-06-29 DIAGNOSIS — C4362 Malignant melanoma of left upper limb, including shoulder: Secondary | ICD-10-CM | POA: Diagnosis not present

## 2015-06-29 DIAGNOSIS — C4359 Malignant melanoma of other part of trunk: Secondary | ICD-10-CM | POA: Diagnosis not present

## 2015-06-29 DIAGNOSIS — I10 Essential (primary) hypertension: Secondary | ICD-10-CM | POA: Diagnosis not present

## 2015-06-29 DIAGNOSIS — C4442 Squamous cell carcinoma of skin of scalp and neck: Secondary | ICD-10-CM | POA: Diagnosis not present

## 2015-06-29 DIAGNOSIS — Z483 Aftercare following surgery for neoplasm: Secondary | ICD-10-CM | POA: Diagnosis not present

## 2015-06-29 DIAGNOSIS — E785 Hyperlipidemia, unspecified: Secondary | ICD-10-CM | POA: Diagnosis not present

## 2015-06-29 LAB — HM DIABETES EYE EXAM

## 2015-07-02 ENCOUNTER — Encounter: Payer: Self-pay | Admitting: Family Medicine

## 2015-07-02 ENCOUNTER — Other Ambulatory Visit: Payer: Self-pay | Admitting: *Deleted

## 2015-07-02 ENCOUNTER — Telehealth: Payer: Self-pay | Admitting: Family Medicine

## 2015-07-02 DIAGNOSIS — C4359 Malignant melanoma of other part of trunk: Secondary | ICD-10-CM | POA: Diagnosis not present

## 2015-07-02 DIAGNOSIS — E785 Hyperlipidemia, unspecified: Secondary | ICD-10-CM | POA: Diagnosis not present

## 2015-07-02 DIAGNOSIS — C4442 Squamous cell carcinoma of skin of scalp and neck: Secondary | ICD-10-CM | POA: Diagnosis not present

## 2015-07-02 DIAGNOSIS — C44320 Squamous cell carcinoma of skin of unspecified parts of face: Secondary | ICD-10-CM | POA: Diagnosis not present

## 2015-07-02 DIAGNOSIS — C4362 Malignant melanoma of left upper limb, including shoulder: Secondary | ICD-10-CM | POA: Diagnosis not present

## 2015-07-02 DIAGNOSIS — I10 Essential (primary) hypertension: Secondary | ICD-10-CM | POA: Diagnosis not present

## 2015-07-02 DIAGNOSIS — E119 Type 2 diabetes mellitus without complications: Secondary | ICD-10-CM | POA: Diagnosis not present

## 2015-07-02 DIAGNOSIS — Z483 Aftercare following surgery for neoplasm: Secondary | ICD-10-CM | POA: Diagnosis not present

## 2015-07-02 DIAGNOSIS — E113599 Type 2 diabetes mellitus with proliferative diabetic retinopathy without macular edema, unspecified eye: Secondary | ICD-10-CM | POA: Insufficient documentation

## 2015-07-02 MED ORDER — ATENOLOL-CHLORTHALIDONE 50-25 MG PO TABS: 1.0000 | ORAL_TABLET | Freq: Every day | ORAL | Status: DC

## 2015-07-02 NOTE — Telephone Encounter (Signed)
Received request for prescription refills, but patient has not been seen for nearly 6 months. Needs to schedule appointment before medications can be refilled

## 2015-07-02 NOTE — Telephone Encounter (Signed)
Requesting 90 day supply.

## 2015-07-03 DIAGNOSIS — S41001A Unspecified open wound of right shoulder, initial encounter: Secondary | ICD-10-CM | POA: Diagnosis not present

## 2015-07-03 NOTE — Telephone Encounter (Signed)
Left message on pt's daughters vm requesting a call back to schedule appt.

## 2015-07-04 DIAGNOSIS — C4362 Malignant melanoma of left upper limb, including shoulder: Secondary | ICD-10-CM | POA: Diagnosis not present

## 2015-07-04 DIAGNOSIS — Z483 Aftercare following surgery for neoplasm: Secondary | ICD-10-CM | POA: Diagnosis not present

## 2015-07-04 DIAGNOSIS — E785 Hyperlipidemia, unspecified: Secondary | ICD-10-CM | POA: Diagnosis not present

## 2015-07-04 DIAGNOSIS — E119 Type 2 diabetes mellitus without complications: Secondary | ICD-10-CM | POA: Diagnosis not present

## 2015-07-04 DIAGNOSIS — I10 Essential (primary) hypertension: Secondary | ICD-10-CM | POA: Diagnosis not present

## 2015-07-04 DIAGNOSIS — C4442 Squamous cell carcinoma of skin of scalp and neck: Secondary | ICD-10-CM | POA: Diagnosis not present

## 2015-07-04 DIAGNOSIS — C4359 Malignant melanoma of other part of trunk: Secondary | ICD-10-CM | POA: Diagnosis not present

## 2015-07-04 DIAGNOSIS — C44320 Squamous cell carcinoma of skin of unspecified parts of face: Secondary | ICD-10-CM | POA: Diagnosis not present

## 2015-07-04 MED ORDER — LOVASTATIN 40 MG PO TABS: 40.0000 mg | ORAL_TABLET | Freq: Every day | ORAL | Status: DC

## 2015-07-04 MED ORDER — PIOGLITAZONE HCL 30 MG PO TABS: 30.0000 mg | ORAL_TABLET | Freq: Every day | ORAL | Status: DC

## 2015-07-04 MED ORDER — GLIPIZIDE 10 MG PO TABS: 10.0000 mg | ORAL_TABLET | Freq: Every day | ORAL | Status: DC

## 2015-07-04 MED ORDER — MONTELUKAST SODIUM 10 MG PO TABS: 10.0000 mg | ORAL_TABLET | Freq: Every evening | ORAL | Status: DC

## 2015-07-05 NOTE — Telephone Encounter (Signed)
Patient has appointment scheduled for 07/11/2015.

## 2015-07-06 DIAGNOSIS — E119 Type 2 diabetes mellitus without complications: Secondary | ICD-10-CM | POA: Diagnosis not present

## 2015-07-06 DIAGNOSIS — C4362 Malignant melanoma of left upper limb, including shoulder: Secondary | ICD-10-CM | POA: Diagnosis not present

## 2015-07-06 DIAGNOSIS — C4359 Malignant melanoma of other part of trunk: Secondary | ICD-10-CM | POA: Diagnosis not present

## 2015-07-06 DIAGNOSIS — C4442 Squamous cell carcinoma of skin of scalp and neck: Secondary | ICD-10-CM | POA: Diagnosis not present

## 2015-07-06 DIAGNOSIS — Z483 Aftercare following surgery for neoplasm: Secondary | ICD-10-CM | POA: Diagnosis not present

## 2015-07-06 DIAGNOSIS — I10 Essential (primary) hypertension: Secondary | ICD-10-CM | POA: Diagnosis not present

## 2015-07-06 DIAGNOSIS — E785 Hyperlipidemia, unspecified: Secondary | ICD-10-CM | POA: Diagnosis not present

## 2015-07-06 DIAGNOSIS — C44320 Squamous cell carcinoma of skin of unspecified parts of face: Secondary | ICD-10-CM | POA: Diagnosis not present

## 2015-07-08 DIAGNOSIS — E785 Hyperlipidemia, unspecified: Secondary | ICD-10-CM | POA: Diagnosis not present

## 2015-07-08 DIAGNOSIS — C4359 Malignant melanoma of other part of trunk: Secondary | ICD-10-CM | POA: Diagnosis not present

## 2015-07-08 DIAGNOSIS — C44320 Squamous cell carcinoma of skin of unspecified parts of face: Secondary | ICD-10-CM | POA: Diagnosis not present

## 2015-07-08 DIAGNOSIS — Z483 Aftercare following surgery for neoplasm: Secondary | ICD-10-CM | POA: Diagnosis not present

## 2015-07-08 DIAGNOSIS — C4442 Squamous cell carcinoma of skin of scalp and neck: Secondary | ICD-10-CM | POA: Diagnosis not present

## 2015-07-08 DIAGNOSIS — I10 Essential (primary) hypertension: Secondary | ICD-10-CM | POA: Diagnosis not present

## 2015-07-08 DIAGNOSIS — C4362 Malignant melanoma of left upper limb, including shoulder: Secondary | ICD-10-CM | POA: Diagnosis not present

## 2015-07-08 DIAGNOSIS — E119 Type 2 diabetes mellitus without complications: Secondary | ICD-10-CM | POA: Diagnosis not present

## 2015-07-11 ENCOUNTER — Ambulatory Visit (INDEPENDENT_AMBULATORY_CARE_PROVIDER_SITE_OTHER): Payer: Medicare Other | Admitting: Family Medicine

## 2015-07-11 ENCOUNTER — Encounter: Payer: Self-pay | Admitting: Family Medicine

## 2015-07-11 VITALS — BP 122/60 | HR 50 | Temp 97.8°F | Resp 16 | Ht 68.0 in | Wt 241.0 lb

## 2015-07-11 DIAGNOSIS — Z483 Aftercare following surgery for neoplasm: Secondary | ICD-10-CM | POA: Diagnosis not present

## 2015-07-11 DIAGNOSIS — Z89511 Acquired absence of right leg below knee: Secondary | ICD-10-CM | POA: Diagnosis not present

## 2015-07-11 DIAGNOSIS — D649 Anemia, unspecified: Secondary | ICD-10-CM

## 2015-07-11 DIAGNOSIS — E785 Hyperlipidemia, unspecified: Secondary | ICD-10-CM | POA: Diagnosis not present

## 2015-07-11 DIAGNOSIS — E1121 Type 2 diabetes mellitus with diabetic nephropathy: Secondary | ICD-10-CM

## 2015-07-11 DIAGNOSIS — N183 Chronic kidney disease, stage 3 unspecified: Secondary | ICD-10-CM

## 2015-07-11 DIAGNOSIS — C4362 Malignant melanoma of left upper limb, including shoulder: Secondary | ICD-10-CM | POA: Diagnosis not present

## 2015-07-11 DIAGNOSIS — I10 Essential (primary) hypertension: Secondary | ICD-10-CM | POA: Diagnosis not present

## 2015-07-11 DIAGNOSIS — E119 Type 2 diabetes mellitus without complications: Secondary | ICD-10-CM | POA: Diagnosis not present

## 2015-07-11 DIAGNOSIS — C4442 Squamous cell carcinoma of skin of scalp and neck: Secondary | ICD-10-CM | POA: Diagnosis not present

## 2015-07-11 DIAGNOSIS — S88111A Complete traumatic amputation at level between knee and ankle, right lower leg, initial encounter: Secondary | ICD-10-CM

## 2015-07-11 DIAGNOSIS — C44320 Squamous cell carcinoma of skin of unspecified parts of face: Secondary | ICD-10-CM | POA: Diagnosis not present

## 2015-07-11 DIAGNOSIS — C4359 Malignant melanoma of other part of trunk: Secondary | ICD-10-CM | POA: Diagnosis not present

## 2015-07-11 LAB — POCT GLYCOSYLATED HEMOGLOBIN (HGB A1C)
ESTIMATED AVERAGE GLUCOSE: 151
HEMOGLOBIN A1C: 6.9

## 2015-07-11 NOTE — Progress Notes (Signed)
Patient: Miguel Torres Male    DOB: 1930/05/07   82 y.o.   MRN: [id number] Visit Date: 07/11/2015  Today's Provider: Lelon Huh, MD   Chief Complaint  Patient presents with  . Follow-up  . Diabetes  . Hypertension  . Anemia  . Chronic Kidney Disease   Subjective:    HPI   Follow-up for CKD from 01/19/2015; no changes. Follow-up for deficiency anemia from 01/19/2015; no changes.    Diabetes Mellitus Type II, Follow-up:   Lab Results  Component Value Date   HGBA1C 7.6* 01/19/2015   HGBA1C 6.9* 07/04/2014   Last seen for diabetes 6 months ago.  Management since then includes; no changes. He reports good compliance with treatment. He is not having side effects. none Current symptoms include none and have been stable. Home blood sugar records: fasting range: n/a  Episodes of hypoglycemia? no   Current Insulin Regimen: n/a Most Recent Eye Exam: 06/2015 Weight trend: stable Prior visit with dietician: no Current diet: well balanced Current exercise: walking  ----------------------------------------------------------------------   Hypertension, follow-up:  BP Readings from Last 3 Encounters:  01/19/15 122/62  07/04/14 128/82    He was last seen for hypertension 6 months ago.  BP at that visit was 122/62. Management since that visit includes; recommended taking coated aspirin 81 mg qd. .He reports good compliance with treatment. He is not having side effects. none  He is exercising. He is adherent to low salt diet.   Outside blood pressures are normal. He is experiencing none.  Patient denies none.   Cardiovascular risk factors include diabetes mellitus.  Use of agents associated with hypertension: none.   ----------------------------------------------------------------------     No Known Allergies Previous Medications   ACETAMINOPHEN (TYLENOL) 325 MG TABLET    Take 650 mg by mouth. As needed   ALLOPURINOL (ZYLOPRIM) 300 MG TABLET    Take  300 mg by mouth 2 (two) times daily.   ATENOLOL-CHLORTHALIDONE (TENORETIC) 50-25 MG TABLET    Take 1 tablet by mouth daily.   FELODIPINE (PLENDIL) 2.5 MG 24 HR TABLET    Take 1 tablet (2.5 mg total) by mouth daily.   FERROUS SULFATE 325 (65 FE) MG TABLET    Take 1 tablet by mouth daily.   FLUOCINONIDE OINTMENT (LIDEX) 0.05 %    FLUOCINONIDE, 0.05% (External Ointment) - Historical Medication  (0.05 %) Active   FUROSEMIDE (LASIX) 20 MG TABLET    Take 1 tablet by mouth daily as needed. For swelling   GLIPIZIDE (GLUCOTROL) 10 MG TABLET    Take 1 tablet (10 mg total) by mouth daily.   HYDROCODONE-ACETAMINOPHEN (NORCO/VICODIN) 5-325 MG PER TABLET    Take 1 tablet by mouth every 6 (six) hours as needed.   INDOMETHACIN (INDOCIN) 50 MG CAPSULE    Take 1 capsule by mouth 3 (three) times daily.   IPRATROPIUM (ATROVENT) 0.03 % NASAL SPRAY    Place into the nose. 2 sprays 2 times per day in each nostril   LISINOPRIL (PRINIVIL,ZESTRIL) 40 MG TABLET    Take 40 mg by mouth daily.   LORATADINE (CLARITIN) 10 MG TABLET    Take 10 mg by mouth daily.   LOVASTATIN (MEVACOR) 40 MG TABLET    Take 1 tablet (40 mg total) by mouth daily.   METFORMIN (GLUCOPHAGE-XR) 500 MG 24 HR TABLET    TAKE 1 TABLET BY MOUTH EVERY DAY   MONTELUKAST (SINGULAIR) 10 MG TABLET    Take 1 tablet (10  mg total) by mouth every evening.   PIOGLITAZONE (ACTOS) 30 MG TABLET    Take 1 tablet (30 mg total) by mouth daily.   POLYETHYLENE GLYCOL (MIRALAX / GLYCOLAX) PACKET    Take by mouth.   TAMSULOSIN (FLOMAX) 0.4 MG CAPS CAPSULE    Take 0.4 mg by mouth.   TRIAMCINOLONE LOTION (KENALOG) 0.1 %    TRIAMCINOLONE ACETONIDE, 0.1% (External Lotion)  1 application daily for 0 days  Quantity: 120;  Refills: 3   Ordered :26-November-2010  Miguel Torres ;  Started 26-November-2010 Active    Review of Systems  Constitutional: Negative for fever, chills and appetite change.  Respiratory: Negative for chest tightness, shortness of breath and wheezing.     Cardiovascular: Negative for chest pain and palpitations.  Gastrointestinal: Negative for nausea, vomiting and abdominal pain.    Social History  Substance Use Topics  . Smoking status: Never Smoker   . Smokeless tobacco: Never Used  . Alcohol Use: No   Objective:   BP 122/60 mmHg  Pulse 50  Temp(Src) 97.8 F (36.6 C) (Oral)  Resp 16  Ht 5\' 8"  (1.727 m)  Wt 241 lb (109.317 kg)  BMI 36.65 kg/m2  SpO2 97%  Physical Exam  General Appearance:    Alert, cooperative, no distress, obese  Eyes:    PERRL, conjunctiva/corneas clear, EOM's intact       Lungs:     Clear to auscultation bilaterally, respirations unlabored  Heart:    Regular rate and rhythm  Neurologic:   Awake, alert, oriented x 3. No apparent focal neurological           defect.   Ext:   S/p right BKA.     Results for orders placed or performed in visit on 07/11/15  POCT glycosylated hemoglobin (Hb A1C)  Result Value Ref Range   Hemoglobin A1C 6.9    Est. average glucose Bld gHb Est-mCnc 151        Assessment & Plan:     1. Diabetes mellitus with nephropathy ([place]) Well controlled.  Continue current medications.   - POCT glycosylated hemoglobin (Hb A1C) - Renal function panel - CBC  2. Chronic kidney disease (CKD), stage III (moderate) Stable. Check labs.  - Renal function panel - CBC - VITAMIN D 25 Hydroxy (Vit-D Deficiency, Fractures)  3. Anemia, unspecified anemia type Check CBC  4. Amputation of right lower extremity below knee upon examination [facility]) Doing well with prosthetic lower leg.        Lelon Huh, MD  [facility]

## 2015-07-12 ENCOUNTER — Encounter: Payer: Self-pay | Admitting: Family Medicine

## 2015-07-12 ENCOUNTER — Telehealth: Payer: Self-pay

## 2015-07-12 DIAGNOSIS — E559 Vitamin D deficiency, unspecified: Secondary | ICD-10-CM

## 2015-07-12 LAB — RENAL FUNCTION PANEL
Albumin: 3.6 g/dL (ref 3.5–4.7)
BUN / CREAT RATIO: 15 (ref 10–22)
BUN: 23 mg/dL (ref 8–27)
CHLORIDE: 98 mmol/L (ref 96–106)
CO2: 22 mmol/L (ref 18–29)
Calcium: 8.7 mg/dL (ref 8.6–10.2)
Creatinine, Ser: 1.55 mg/dL — ABNORMAL HIGH (ref 0.76–1.27)
GFR calc non Af Amer: 41 mL/min/{1.73_m2} — ABNORMAL LOW (ref >59)
GFR, EST AFRICAN AMERICAN: 47 mL/min/{1.73_m2} — AB (ref >59)
GLUCOSE: 158 mg/dL — AB (ref 65–99)
Phosphorus: 3.1 mg/dL (ref 2.5–4.5)
Potassium: 5.1 mmol/L (ref 3.5–5.2)
Sodium: 136 mmol/L (ref 134–144)

## 2015-07-12 LAB — CBC
HEMATOCRIT: 31.9 % — AB (ref 37.5–51.0)
Hemoglobin: 10.4 g/dL — ABNORMAL LOW (ref 12.6–17.7)
MCH: 29.4 pg (ref 26.6–33.0)
MCHC: 32.6 g/dL (ref 31.5–35.7)
MCV: 90 fL (ref 79–97)
Platelets: 222 10*3/uL (ref 150–379)
RBC: 3.54 x10E6/uL — ABNORMAL LOW (ref 4.14–5.80)
RDW: 14 % (ref 12.3–15.4)
WBC: 6.6 10*3/uL (ref 3.4–10.8)

## 2015-07-12 LAB — VITAMIN D 25 HYDROXY (VIT D DEFICIENCY, FRACTURES): Vit D, 25-Hydroxy: 8.6 ng/mL — ABNORMAL LOW (ref 30.0–100.0)

## 2015-07-12 MED ORDER — VITAMIN D (ERGOCALCIFEROL) 1.25 MG (50000 UNIT) PO CAPS: 50000.0000 [IU] | ORAL_CAPSULE | ORAL | Status: DC

## 2015-07-12 MED ORDER — METFORMIN HCL ER 500 MG PO TB24: 500.0000 mg | ORAL_TABLET | Freq: Every day | ORAL | Status: DC

## 2015-07-12 NOTE — Telephone Encounter (Signed)
Advised daughter of results.

## 2015-07-12 NOTE — Telephone Encounter (Signed)
-----   Message from [name], MD sent at 07/12/2015  8:05 AM EST ----- He is very vitamin deficient. Needs to start vitamin d 50,000 units once weekly, #12, rf x 3.

## 2015-07-12 NOTE — Telephone Encounter (Signed)
Left message to call back  

## 2015-07-13 DIAGNOSIS — Z483 Aftercare following surgery for neoplasm: Secondary | ICD-10-CM | POA: Diagnosis not present

## 2015-07-13 DIAGNOSIS — C4362 Malignant melanoma of left upper limb, including shoulder: Secondary | ICD-10-CM | POA: Diagnosis not present

## 2015-07-13 DIAGNOSIS — C4359 Malignant melanoma of other part of trunk: Secondary | ICD-10-CM | POA: Diagnosis not present

## 2015-07-13 DIAGNOSIS — C44320 Squamous cell carcinoma of skin of unspecified parts of face: Secondary | ICD-10-CM | POA: Diagnosis not present

## 2015-07-13 DIAGNOSIS — I10 Essential (primary) hypertension: Secondary | ICD-10-CM | POA: Diagnosis not present

## 2015-07-13 DIAGNOSIS — C4442 Squamous cell carcinoma of skin of scalp and neck: Secondary | ICD-10-CM | POA: Diagnosis not present

## 2015-07-13 DIAGNOSIS — E785 Hyperlipidemia, unspecified: Secondary | ICD-10-CM | POA: Diagnosis not present

## 2015-07-13 DIAGNOSIS — E119 Type 2 diabetes mellitus without complications: Secondary | ICD-10-CM | POA: Diagnosis not present

## 2015-07-15 DIAGNOSIS — E785 Hyperlipidemia, unspecified: Secondary | ICD-10-CM | POA: Diagnosis not present

## 2015-07-15 DIAGNOSIS — E119 Type 2 diabetes mellitus without complications: Secondary | ICD-10-CM | POA: Diagnosis not present

## 2015-07-15 DIAGNOSIS — Z483 Aftercare following surgery for neoplasm: Secondary | ICD-10-CM | POA: Diagnosis not present

## 2015-07-15 DIAGNOSIS — C4442 Squamous cell carcinoma of skin of scalp and neck: Secondary | ICD-10-CM | POA: Diagnosis not present

## 2015-07-15 DIAGNOSIS — C4359 Malignant melanoma of other part of trunk: Secondary | ICD-10-CM | POA: Diagnosis not present

## 2015-07-15 DIAGNOSIS — C44320 Squamous cell carcinoma of skin of unspecified parts of face: Secondary | ICD-10-CM | POA: Diagnosis not present

## 2015-07-15 DIAGNOSIS — C4362 Malignant melanoma of left upper limb, including shoulder: Secondary | ICD-10-CM | POA: Diagnosis not present

## 2015-07-15 DIAGNOSIS — I10 Essential (primary) hypertension: Secondary | ICD-10-CM | POA: Diagnosis not present

## 2015-07-18 DIAGNOSIS — C4442 Squamous cell carcinoma of skin of scalp and neck: Secondary | ICD-10-CM | POA: Diagnosis not present

## 2015-07-18 DIAGNOSIS — E785 Hyperlipidemia, unspecified: Secondary | ICD-10-CM | POA: Diagnosis not present

## 2015-07-18 DIAGNOSIS — I10 Essential (primary) hypertension: Secondary | ICD-10-CM | POA: Diagnosis not present

## 2015-07-18 DIAGNOSIS — Z483 Aftercare following surgery for neoplasm: Secondary | ICD-10-CM | POA: Diagnosis not present

## 2015-07-18 DIAGNOSIS — E119 Type 2 diabetes mellitus without complications: Secondary | ICD-10-CM | POA: Diagnosis not present

## 2015-07-18 DIAGNOSIS — C4359 Malignant melanoma of other part of trunk: Secondary | ICD-10-CM | POA: Diagnosis not present

## 2015-07-18 DIAGNOSIS — C44320 Squamous cell carcinoma of skin of unspecified parts of face: Secondary | ICD-10-CM | POA: Diagnosis not present

## 2015-07-18 DIAGNOSIS — C4362 Malignant melanoma of left upper limb, including shoulder: Secondary | ICD-10-CM | POA: Diagnosis not present

## 2015-07-20 DIAGNOSIS — Z483 Aftercare following surgery for neoplasm: Secondary | ICD-10-CM | POA: Diagnosis not present

## 2015-07-20 DIAGNOSIS — E785 Hyperlipidemia, unspecified: Secondary | ICD-10-CM | POA: Diagnosis not present

## 2015-07-20 DIAGNOSIS — C44320 Squamous cell carcinoma of skin of unspecified parts of face: Secondary | ICD-10-CM | POA: Diagnosis not present

## 2015-07-20 DIAGNOSIS — I10 Essential (primary) hypertension: Secondary | ICD-10-CM | POA: Diagnosis not present

## 2015-07-20 DIAGNOSIS — E119 Type 2 diabetes mellitus without complications: Secondary | ICD-10-CM | POA: Diagnosis not present

## 2015-07-20 DIAGNOSIS — S41001A Unspecified open wound of right shoulder, initial encounter: Secondary | ICD-10-CM | POA: Diagnosis not present

## 2015-07-20 DIAGNOSIS — C4362 Malignant melanoma of left upper limb, including shoulder: Secondary | ICD-10-CM | POA: Diagnosis not present

## 2015-07-20 DIAGNOSIS — C4359 Malignant melanoma of other part of trunk: Secondary | ICD-10-CM | POA: Diagnosis not present

## 2015-07-20 DIAGNOSIS — C4442 Squamous cell carcinoma of skin of scalp and neck: Secondary | ICD-10-CM | POA: Diagnosis not present

## 2015-07-22 DIAGNOSIS — E785 Hyperlipidemia, unspecified: Secondary | ICD-10-CM | POA: Diagnosis not present

## 2015-07-22 DIAGNOSIS — C44320 Squamous cell carcinoma of skin of unspecified parts of face: Secondary | ICD-10-CM | POA: Diagnosis not present

## 2015-07-22 DIAGNOSIS — C4362 Malignant melanoma of left upper limb, including shoulder: Secondary | ICD-10-CM | POA: Diagnosis not present

## 2015-07-22 DIAGNOSIS — E119 Type 2 diabetes mellitus without complications: Secondary | ICD-10-CM | POA: Diagnosis not present

## 2015-07-22 DIAGNOSIS — I10 Essential (primary) hypertension: Secondary | ICD-10-CM | POA: Diagnosis not present

## 2015-07-22 DIAGNOSIS — C4359 Malignant melanoma of other part of trunk: Secondary | ICD-10-CM | POA: Diagnosis not present

## 2015-07-22 DIAGNOSIS — Z483 Aftercare following surgery for neoplasm: Secondary | ICD-10-CM | POA: Diagnosis not present

## 2015-07-22 DIAGNOSIS — C4442 Squamous cell carcinoma of skin of scalp and neck: Secondary | ICD-10-CM | POA: Diagnosis not present

## 2015-07-25 DIAGNOSIS — L988 Other specified disorders of the skin and subcutaneous tissue: Secondary | ICD-10-CM | POA: Diagnosis not present

## 2015-07-27 DIAGNOSIS — E119 Type 2 diabetes mellitus without complications: Secondary | ICD-10-CM | POA: Diagnosis not present

## 2015-07-27 DIAGNOSIS — Z483 Aftercare following surgery for neoplasm: Secondary | ICD-10-CM | POA: Diagnosis not present

## 2015-07-27 DIAGNOSIS — C44320 Squamous cell carcinoma of skin of unspecified parts of face: Secondary | ICD-10-CM | POA: Diagnosis not present

## 2015-07-27 DIAGNOSIS — I10 Essential (primary) hypertension: Secondary | ICD-10-CM | POA: Diagnosis not present

## 2015-07-27 DIAGNOSIS — C4362 Malignant melanoma of left upper limb, including shoulder: Secondary | ICD-10-CM | POA: Diagnosis not present

## 2015-07-27 DIAGNOSIS — E785 Hyperlipidemia, unspecified: Secondary | ICD-10-CM | POA: Diagnosis not present

## 2015-07-27 DIAGNOSIS — C4442 Squamous cell carcinoma of skin of scalp and neck: Secondary | ICD-10-CM | POA: Diagnosis not present

## 2015-07-27 DIAGNOSIS — C4359 Malignant melanoma of other part of trunk: Secondary | ICD-10-CM | POA: Diagnosis not present

## 2015-07-30 DIAGNOSIS — I10 Essential (primary) hypertension: Secondary | ICD-10-CM | POA: Diagnosis not present

## 2015-07-30 DIAGNOSIS — C4442 Squamous cell carcinoma of skin of scalp and neck: Secondary | ICD-10-CM | POA: Diagnosis not present

## 2015-07-30 DIAGNOSIS — Z483 Aftercare following surgery for neoplasm: Secondary | ICD-10-CM | POA: Diagnosis not present

## 2015-07-30 DIAGNOSIS — C44320 Squamous cell carcinoma of skin of unspecified parts of face: Secondary | ICD-10-CM | POA: Diagnosis not present

## 2015-07-30 DIAGNOSIS — C4359 Malignant melanoma of other part of trunk: Secondary | ICD-10-CM | POA: Diagnosis not present

## 2015-07-30 DIAGNOSIS — C4362 Malignant melanoma of left upper limb, including shoulder: Secondary | ICD-10-CM | POA: Diagnosis not present

## 2015-07-30 DIAGNOSIS — E785 Hyperlipidemia, unspecified: Secondary | ICD-10-CM | POA: Diagnosis not present

## 2015-07-30 DIAGNOSIS — E119 Type 2 diabetes mellitus without complications: Secondary | ICD-10-CM | POA: Diagnosis not present

## 2015-08-01 DIAGNOSIS — C4359 Malignant melanoma of other part of trunk: Secondary | ICD-10-CM | POA: Diagnosis not present

## 2015-08-01 DIAGNOSIS — E119 Type 2 diabetes mellitus without complications: Secondary | ICD-10-CM | POA: Diagnosis not present

## 2015-08-01 DIAGNOSIS — C4362 Malignant melanoma of left upper limb, including shoulder: Secondary | ICD-10-CM | POA: Diagnosis not present

## 2015-08-01 DIAGNOSIS — C44320 Squamous cell carcinoma of skin of unspecified parts of face: Secondary | ICD-10-CM | POA: Diagnosis not present

## 2015-08-01 DIAGNOSIS — C4442 Squamous cell carcinoma of skin of scalp and neck: Secondary | ICD-10-CM | POA: Diagnosis not present

## 2015-08-01 DIAGNOSIS — I10 Essential (primary) hypertension: Secondary | ICD-10-CM | POA: Diagnosis not present

## 2015-08-01 DIAGNOSIS — E785 Hyperlipidemia, unspecified: Secondary | ICD-10-CM | POA: Diagnosis not present

## 2015-08-01 DIAGNOSIS — Z483 Aftercare following surgery for neoplasm: Secondary | ICD-10-CM | POA: Diagnosis not present

## 2015-08-03 DIAGNOSIS — I10 Essential (primary) hypertension: Secondary | ICD-10-CM | POA: Diagnosis not present

## 2015-08-03 DIAGNOSIS — C4362 Malignant melanoma of left upper limb, including shoulder: Secondary | ICD-10-CM | POA: Diagnosis not present

## 2015-08-03 DIAGNOSIS — C44320 Squamous cell carcinoma of skin of unspecified parts of face: Secondary | ICD-10-CM | POA: Diagnosis not present

## 2015-08-03 DIAGNOSIS — C4442 Squamous cell carcinoma of skin of scalp and neck: Secondary | ICD-10-CM | POA: Diagnosis not present

## 2015-08-03 DIAGNOSIS — Z483 Aftercare following surgery for neoplasm: Secondary | ICD-10-CM | POA: Diagnosis not present

## 2015-08-03 DIAGNOSIS — E785 Hyperlipidemia, unspecified: Secondary | ICD-10-CM | POA: Diagnosis not present

## 2015-08-03 DIAGNOSIS — C4359 Malignant melanoma of other part of trunk: Secondary | ICD-10-CM | POA: Diagnosis not present

## 2015-08-03 DIAGNOSIS — E119 Type 2 diabetes mellitus without complications: Secondary | ICD-10-CM | POA: Diagnosis not present

## 2015-08-06 DIAGNOSIS — I10 Essential (primary) hypertension: Secondary | ICD-10-CM | POA: Diagnosis not present

## 2015-08-06 DIAGNOSIS — C4359 Malignant melanoma of other part of trunk: Secondary | ICD-10-CM | POA: Diagnosis not present

## 2015-08-06 DIAGNOSIS — Z483 Aftercare following surgery for neoplasm: Secondary | ICD-10-CM | POA: Diagnosis not present

## 2015-08-06 DIAGNOSIS — C4442 Squamous cell carcinoma of skin of scalp and neck: Secondary | ICD-10-CM | POA: Diagnosis not present

## 2015-08-06 DIAGNOSIS — C4362 Malignant melanoma of left upper limb, including shoulder: Secondary | ICD-10-CM | POA: Diagnosis not present

## 2015-08-06 DIAGNOSIS — C44320 Squamous cell carcinoma of skin of unspecified parts of face: Secondary | ICD-10-CM | POA: Diagnosis not present

## 2015-08-06 DIAGNOSIS — E785 Hyperlipidemia, unspecified: Secondary | ICD-10-CM | POA: Diagnosis not present

## 2015-08-06 DIAGNOSIS — E119 Type 2 diabetes mellitus without complications: Secondary | ICD-10-CM | POA: Diagnosis not present

## 2015-08-08 DIAGNOSIS — E785 Hyperlipidemia, unspecified: Secondary | ICD-10-CM | POA: Diagnosis not present

## 2015-08-08 DIAGNOSIS — C4359 Malignant melanoma of other part of trunk: Secondary | ICD-10-CM | POA: Diagnosis not present

## 2015-08-08 DIAGNOSIS — C4362 Malignant melanoma of left upper limb, including shoulder: Secondary | ICD-10-CM | POA: Diagnosis not present

## 2015-08-08 DIAGNOSIS — C44320 Squamous cell carcinoma of skin of unspecified parts of face: Secondary | ICD-10-CM | POA: Diagnosis not present

## 2015-08-08 DIAGNOSIS — E119 Type 2 diabetes mellitus without complications: Secondary | ICD-10-CM | POA: Diagnosis not present

## 2015-08-08 DIAGNOSIS — Z483 Aftercare following surgery for neoplasm: Secondary | ICD-10-CM | POA: Diagnosis not present

## 2015-08-08 DIAGNOSIS — C4442 Squamous cell carcinoma of skin of scalp and neck: Secondary | ICD-10-CM | POA: Diagnosis not present

## 2015-08-08 DIAGNOSIS — I10 Essential (primary) hypertension: Secondary | ICD-10-CM | POA: Diagnosis not present

## 2015-08-10 DIAGNOSIS — C4362 Malignant melanoma of left upper limb, including shoulder: Secondary | ICD-10-CM | POA: Diagnosis not present

## 2015-08-10 DIAGNOSIS — E119 Type 2 diabetes mellitus without complications: Secondary | ICD-10-CM | POA: Diagnosis not present

## 2015-08-10 DIAGNOSIS — C44320 Squamous cell carcinoma of skin of unspecified parts of face: Secondary | ICD-10-CM | POA: Diagnosis not present

## 2015-08-10 DIAGNOSIS — C4359 Malignant melanoma of other part of trunk: Secondary | ICD-10-CM | POA: Diagnosis not present

## 2015-08-10 DIAGNOSIS — I10 Essential (primary) hypertension: Secondary | ICD-10-CM | POA: Diagnosis not present

## 2015-08-10 DIAGNOSIS — Z483 Aftercare following surgery for neoplasm: Secondary | ICD-10-CM | POA: Diagnosis not present

## 2015-08-10 DIAGNOSIS — E785 Hyperlipidemia, unspecified: Secondary | ICD-10-CM | POA: Diagnosis not present

## 2015-08-10 DIAGNOSIS — C4442 Squamous cell carcinoma of skin of scalp and neck: Secondary | ICD-10-CM | POA: Diagnosis not present

## 2015-08-13 DIAGNOSIS — C4442 Squamous cell carcinoma of skin of scalp and neck: Secondary | ICD-10-CM | POA: Diagnosis not present

## 2015-08-13 DIAGNOSIS — E785 Hyperlipidemia, unspecified: Secondary | ICD-10-CM | POA: Diagnosis not present

## 2015-08-13 DIAGNOSIS — I10 Essential (primary) hypertension: Secondary | ICD-10-CM | POA: Diagnosis not present

## 2015-08-13 DIAGNOSIS — H26492 Other secondary cataract, left eye: Secondary | ICD-10-CM | POA: Diagnosis not present

## 2015-08-13 DIAGNOSIS — C4359 Malignant melanoma of other part of trunk: Secondary | ICD-10-CM | POA: Diagnosis not present

## 2015-08-13 DIAGNOSIS — C4362 Malignant melanoma of left upper limb, including shoulder: Secondary | ICD-10-CM | POA: Diagnosis not present

## 2015-08-13 DIAGNOSIS — E119 Type 2 diabetes mellitus without complications: Secondary | ICD-10-CM | POA: Diagnosis not present

## 2015-08-13 DIAGNOSIS — Z483 Aftercare following surgery for neoplasm: Secondary | ICD-10-CM | POA: Diagnosis not present

## 2015-08-13 DIAGNOSIS — C44320 Squamous cell carcinoma of skin of unspecified parts of face: Secondary | ICD-10-CM | POA: Diagnosis not present

## 2015-08-15 DIAGNOSIS — C44320 Squamous cell carcinoma of skin of unspecified parts of face: Secondary | ICD-10-CM | POA: Diagnosis not present

## 2015-08-15 DIAGNOSIS — C4362 Malignant melanoma of left upper limb, including shoulder: Secondary | ICD-10-CM | POA: Diagnosis not present

## 2015-08-15 DIAGNOSIS — Z483 Aftercare following surgery for neoplasm: Secondary | ICD-10-CM | POA: Diagnosis not present

## 2015-08-15 DIAGNOSIS — C4442 Squamous cell carcinoma of skin of scalp and neck: Secondary | ICD-10-CM | POA: Diagnosis not present

## 2015-08-15 DIAGNOSIS — C4359 Malignant melanoma of other part of trunk: Secondary | ICD-10-CM | POA: Diagnosis not present

## 2015-08-15 DIAGNOSIS — E785 Hyperlipidemia, unspecified: Secondary | ICD-10-CM | POA: Diagnosis not present

## 2015-08-15 DIAGNOSIS — E119 Type 2 diabetes mellitus without complications: Secondary | ICD-10-CM | POA: Diagnosis not present

## 2015-08-15 DIAGNOSIS — I10 Essential (primary) hypertension: Secondary | ICD-10-CM | POA: Diagnosis not present

## 2015-08-17 DIAGNOSIS — C4362 Malignant melanoma of left upper limb, including shoulder: Secondary | ICD-10-CM | POA: Diagnosis not present

## 2015-08-17 DIAGNOSIS — E785 Hyperlipidemia, unspecified: Secondary | ICD-10-CM | POA: Diagnosis not present

## 2015-08-17 DIAGNOSIS — C4359 Malignant melanoma of other part of trunk: Secondary | ICD-10-CM | POA: Diagnosis not present

## 2015-08-17 DIAGNOSIS — Z483 Aftercare following surgery for neoplasm: Secondary | ICD-10-CM | POA: Diagnosis not present

## 2015-08-17 DIAGNOSIS — E119 Type 2 diabetes mellitus without complications: Secondary | ICD-10-CM | POA: Diagnosis not present

## 2015-08-17 DIAGNOSIS — C4442 Squamous cell carcinoma of skin of scalp and neck: Secondary | ICD-10-CM | POA: Diagnosis not present

## 2015-08-17 DIAGNOSIS — I10 Essential (primary) hypertension: Secondary | ICD-10-CM | POA: Diagnosis not present

## 2015-08-17 DIAGNOSIS — C44320 Squamous cell carcinoma of skin of unspecified parts of face: Secondary | ICD-10-CM | POA: Diagnosis not present

## 2015-08-20 DIAGNOSIS — C4362 Malignant melanoma of left upper limb, including shoulder: Secondary | ICD-10-CM | POA: Diagnosis not present

## 2015-08-20 DIAGNOSIS — C44320 Squamous cell carcinoma of skin of unspecified parts of face: Secondary | ICD-10-CM | POA: Diagnosis not present

## 2015-08-20 DIAGNOSIS — Z483 Aftercare following surgery for neoplasm: Secondary | ICD-10-CM | POA: Diagnosis not present

## 2015-08-20 DIAGNOSIS — E785 Hyperlipidemia, unspecified: Secondary | ICD-10-CM | POA: Diagnosis not present

## 2015-08-20 DIAGNOSIS — C4359 Malignant melanoma of other part of trunk: Secondary | ICD-10-CM | POA: Diagnosis not present

## 2015-08-20 DIAGNOSIS — I10 Essential (primary) hypertension: Secondary | ICD-10-CM | POA: Diagnosis not present

## 2015-08-20 DIAGNOSIS — C4442 Squamous cell carcinoma of skin of scalp and neck: Secondary | ICD-10-CM | POA: Diagnosis not present

## 2015-08-20 DIAGNOSIS — E119 Type 2 diabetes mellitus without complications: Secondary | ICD-10-CM | POA: Diagnosis not present

## 2015-08-22 DIAGNOSIS — Z483 Aftercare following surgery for neoplasm: Secondary | ICD-10-CM | POA: Diagnosis not present

## 2015-08-22 DIAGNOSIS — I10 Essential (primary) hypertension: Secondary | ICD-10-CM | POA: Diagnosis not present

## 2015-08-22 DIAGNOSIS — C4359 Malignant melanoma of other part of trunk: Secondary | ICD-10-CM | POA: Diagnosis not present

## 2015-08-22 DIAGNOSIS — E785 Hyperlipidemia, unspecified: Secondary | ICD-10-CM | POA: Diagnosis not present

## 2015-08-22 DIAGNOSIS — C4442 Squamous cell carcinoma of skin of scalp and neck: Secondary | ICD-10-CM | POA: Diagnosis not present

## 2015-08-22 DIAGNOSIS — C4362 Malignant melanoma of left upper limb, including shoulder: Secondary | ICD-10-CM | POA: Diagnosis not present

## 2015-08-22 DIAGNOSIS — C44320 Squamous cell carcinoma of skin of unspecified parts of face: Secondary | ICD-10-CM | POA: Diagnosis not present

## 2015-08-22 DIAGNOSIS — E119 Type 2 diabetes mellitus without complications: Secondary | ICD-10-CM | POA: Diagnosis not present

## 2015-08-24 DIAGNOSIS — C4359 Malignant melanoma of other part of trunk: Secondary | ICD-10-CM | POA: Diagnosis not present

## 2015-08-24 DIAGNOSIS — C4362 Malignant melanoma of left upper limb, including shoulder: Secondary | ICD-10-CM | POA: Diagnosis not present

## 2015-08-24 DIAGNOSIS — I10 Essential (primary) hypertension: Secondary | ICD-10-CM | POA: Diagnosis not present

## 2015-08-24 DIAGNOSIS — C4442 Squamous cell carcinoma of skin of scalp and neck: Secondary | ICD-10-CM | POA: Diagnosis not present

## 2015-08-24 DIAGNOSIS — C44320 Squamous cell carcinoma of skin of unspecified parts of face: Secondary | ICD-10-CM | POA: Diagnosis not present

## 2015-08-24 DIAGNOSIS — E119 Type 2 diabetes mellitus without complications: Secondary | ICD-10-CM | POA: Diagnosis not present

## 2015-08-24 DIAGNOSIS — E785 Hyperlipidemia, unspecified: Secondary | ICD-10-CM | POA: Diagnosis not present

## 2015-08-24 DIAGNOSIS — Z483 Aftercare following surgery for neoplasm: Secondary | ICD-10-CM | POA: Diagnosis not present

## 2015-08-27 DIAGNOSIS — E785 Hyperlipidemia, unspecified: Secondary | ICD-10-CM | POA: Diagnosis not present

## 2015-08-27 DIAGNOSIS — C4359 Malignant melanoma of other part of trunk: Secondary | ICD-10-CM | POA: Diagnosis not present

## 2015-08-27 DIAGNOSIS — E119 Type 2 diabetes mellitus without complications: Secondary | ICD-10-CM | POA: Diagnosis not present

## 2015-08-27 DIAGNOSIS — Z483 Aftercare following surgery for neoplasm: Secondary | ICD-10-CM | POA: Diagnosis not present

## 2015-08-27 DIAGNOSIS — I10 Essential (primary) hypertension: Secondary | ICD-10-CM | POA: Diagnosis not present

## 2015-08-27 DIAGNOSIS — C4442 Squamous cell carcinoma of skin of scalp and neck: Secondary | ICD-10-CM | POA: Diagnosis not present

## 2015-08-27 DIAGNOSIS — C44320 Squamous cell carcinoma of skin of unspecified parts of face: Secondary | ICD-10-CM | POA: Diagnosis not present

## 2015-08-27 DIAGNOSIS — C4362 Malignant melanoma of left upper limb, including shoulder: Secondary | ICD-10-CM | POA: Diagnosis not present

## 2015-08-29 DIAGNOSIS — I10 Essential (primary) hypertension: Secondary | ICD-10-CM | POA: Diagnosis not present

## 2015-08-29 DIAGNOSIS — C4442 Squamous cell carcinoma of skin of scalp and neck: Secondary | ICD-10-CM | POA: Diagnosis not present

## 2015-08-29 DIAGNOSIS — Z483 Aftercare following surgery for neoplasm: Secondary | ICD-10-CM | POA: Diagnosis not present

## 2015-08-29 DIAGNOSIS — C4362 Malignant melanoma of left upper limb, including shoulder: Secondary | ICD-10-CM | POA: Diagnosis not present

## 2015-08-29 DIAGNOSIS — E119 Type 2 diabetes mellitus without complications: Secondary | ICD-10-CM | POA: Diagnosis not present

## 2015-08-29 DIAGNOSIS — C44320 Squamous cell carcinoma of skin of unspecified parts of face: Secondary | ICD-10-CM | POA: Diagnosis not present

## 2015-08-29 DIAGNOSIS — E785 Hyperlipidemia, unspecified: Secondary | ICD-10-CM | POA: Diagnosis not present

## 2015-08-29 DIAGNOSIS — C4359 Malignant melanoma of other part of trunk: Secondary | ICD-10-CM | POA: Diagnosis not present

## 2015-08-31 DIAGNOSIS — C4442 Squamous cell carcinoma of skin of scalp and neck: Secondary | ICD-10-CM | POA: Diagnosis not present

## 2015-08-31 DIAGNOSIS — E785 Hyperlipidemia, unspecified: Secondary | ICD-10-CM | POA: Diagnosis not present

## 2015-08-31 DIAGNOSIS — C4362 Malignant melanoma of left upper limb, including shoulder: Secondary | ICD-10-CM | POA: Diagnosis not present

## 2015-08-31 DIAGNOSIS — C4359 Malignant melanoma of other part of trunk: Secondary | ICD-10-CM | POA: Diagnosis not present

## 2015-08-31 DIAGNOSIS — I10 Essential (primary) hypertension: Secondary | ICD-10-CM | POA: Diagnosis not present

## 2015-08-31 DIAGNOSIS — C44320 Squamous cell carcinoma of skin of unspecified parts of face: Secondary | ICD-10-CM | POA: Diagnosis not present

## 2015-08-31 DIAGNOSIS — E119 Type 2 diabetes mellitus without complications: Secondary | ICD-10-CM | POA: Diagnosis not present

## 2015-08-31 DIAGNOSIS — Z483 Aftercare following surgery for neoplasm: Secondary | ICD-10-CM | POA: Diagnosis not present

## 2015-09-03 DIAGNOSIS — E785 Hyperlipidemia, unspecified: Secondary | ICD-10-CM | POA: Diagnosis not present

## 2015-09-03 DIAGNOSIS — C44320 Squamous cell carcinoma of skin of unspecified parts of face: Secondary | ICD-10-CM | POA: Diagnosis not present

## 2015-09-03 DIAGNOSIS — C4442 Squamous cell carcinoma of skin of scalp and neck: Secondary | ICD-10-CM | POA: Diagnosis not present

## 2015-09-03 DIAGNOSIS — C4362 Malignant melanoma of left upper limb, including shoulder: Secondary | ICD-10-CM | POA: Diagnosis not present

## 2015-09-03 DIAGNOSIS — E119 Type 2 diabetes mellitus without complications: Secondary | ICD-10-CM | POA: Diagnosis not present

## 2015-09-03 DIAGNOSIS — Z483 Aftercare following surgery for neoplasm: Secondary | ICD-10-CM | POA: Diagnosis not present

## 2015-09-03 DIAGNOSIS — I10 Essential (primary) hypertension: Secondary | ICD-10-CM | POA: Diagnosis not present

## 2015-09-03 DIAGNOSIS — C4359 Malignant melanoma of other part of trunk: Secondary | ICD-10-CM | POA: Diagnosis not present

## 2015-09-05 DIAGNOSIS — Z483 Aftercare following surgery for neoplasm: Secondary | ICD-10-CM | POA: Diagnosis not present

## 2015-09-05 DIAGNOSIS — C4359 Malignant melanoma of other part of trunk: Secondary | ICD-10-CM | POA: Diagnosis not present

## 2015-09-05 DIAGNOSIS — E119 Type 2 diabetes mellitus without complications: Secondary | ICD-10-CM | POA: Diagnosis not present

## 2015-09-05 DIAGNOSIS — C4442 Squamous cell carcinoma of skin of scalp and neck: Secondary | ICD-10-CM | POA: Diagnosis not present

## 2015-09-05 DIAGNOSIS — C44320 Squamous cell carcinoma of skin of unspecified parts of face: Secondary | ICD-10-CM | POA: Diagnosis not present

## 2015-09-05 DIAGNOSIS — I10 Essential (primary) hypertension: Secondary | ICD-10-CM | POA: Diagnosis not present

## 2015-09-05 DIAGNOSIS — C4362 Malignant melanoma of left upper limb, including shoulder: Secondary | ICD-10-CM | POA: Diagnosis not present

## 2015-09-05 DIAGNOSIS — E785 Hyperlipidemia, unspecified: Secondary | ICD-10-CM | POA: Diagnosis not present

## 2015-09-07 DIAGNOSIS — Z483 Aftercare following surgery for neoplasm: Secondary | ICD-10-CM | POA: Diagnosis not present

## 2015-09-07 DIAGNOSIS — I10 Essential (primary) hypertension: Secondary | ICD-10-CM | POA: Diagnosis not present

## 2015-09-07 DIAGNOSIS — C4359 Malignant melanoma of other part of trunk: Secondary | ICD-10-CM | POA: Diagnosis not present

## 2015-09-07 DIAGNOSIS — C44320 Squamous cell carcinoma of skin of unspecified parts of face: Secondary | ICD-10-CM | POA: Diagnosis not present

## 2015-09-07 DIAGNOSIS — E119 Type 2 diabetes mellitus without complications: Secondary | ICD-10-CM | POA: Diagnosis not present

## 2015-09-07 DIAGNOSIS — E785 Hyperlipidemia, unspecified: Secondary | ICD-10-CM | POA: Diagnosis not present

## 2015-09-07 DIAGNOSIS — C4362 Malignant melanoma of left upper limb, including shoulder: Secondary | ICD-10-CM | POA: Diagnosis not present

## 2015-09-07 DIAGNOSIS — C4442 Squamous cell carcinoma of skin of scalp and neck: Secondary | ICD-10-CM | POA: Diagnosis not present

## 2015-09-09 DIAGNOSIS — C4359 Malignant melanoma of other part of trunk: Secondary | ICD-10-CM | POA: Diagnosis not present

## 2015-09-09 DIAGNOSIS — E119 Type 2 diabetes mellitus without complications: Secondary | ICD-10-CM | POA: Diagnosis not present

## 2015-09-09 DIAGNOSIS — C4362 Malignant melanoma of left upper limb, including shoulder: Secondary | ICD-10-CM | POA: Diagnosis not present

## 2015-09-09 DIAGNOSIS — I10 Essential (primary) hypertension: Secondary | ICD-10-CM | POA: Diagnosis not present

## 2015-09-09 DIAGNOSIS — C4442 Squamous cell carcinoma of skin of scalp and neck: Secondary | ICD-10-CM | POA: Diagnosis not present

## 2015-09-09 DIAGNOSIS — C44320 Squamous cell carcinoma of skin of unspecified parts of face: Secondary | ICD-10-CM | POA: Diagnosis not present

## 2015-09-09 DIAGNOSIS — Z483 Aftercare following surgery for neoplasm: Secondary | ICD-10-CM | POA: Diagnosis not present

## 2015-09-09 DIAGNOSIS — E785 Hyperlipidemia, unspecified: Secondary | ICD-10-CM | POA: Diagnosis not present

## 2015-09-12 DIAGNOSIS — C44320 Squamous cell carcinoma of skin of unspecified parts of face: Secondary | ICD-10-CM | POA: Diagnosis not present

## 2015-09-12 DIAGNOSIS — C4362 Malignant melanoma of left upper limb, including shoulder: Secondary | ICD-10-CM | POA: Diagnosis not present

## 2015-09-12 DIAGNOSIS — C4359 Malignant melanoma of other part of trunk: Secondary | ICD-10-CM | POA: Diagnosis not present

## 2015-09-12 DIAGNOSIS — Z483 Aftercare following surgery for neoplasm: Secondary | ICD-10-CM | POA: Diagnosis not present

## 2015-09-12 DIAGNOSIS — E119 Type 2 diabetes mellitus without complications: Secondary | ICD-10-CM | POA: Diagnosis not present

## 2015-09-12 DIAGNOSIS — E785 Hyperlipidemia, unspecified: Secondary | ICD-10-CM | POA: Diagnosis not present

## 2015-09-12 DIAGNOSIS — C4442 Squamous cell carcinoma of skin of scalp and neck: Secondary | ICD-10-CM | POA: Diagnosis not present

## 2015-09-12 DIAGNOSIS — I10 Essential (primary) hypertension: Secondary | ICD-10-CM | POA: Diagnosis not present

## 2015-09-14 DIAGNOSIS — C4442 Squamous cell carcinoma of skin of scalp and neck: Secondary | ICD-10-CM | POA: Diagnosis not present

## 2015-09-14 DIAGNOSIS — E119 Type 2 diabetes mellitus without complications: Secondary | ICD-10-CM | POA: Diagnosis not present

## 2015-09-14 DIAGNOSIS — C4359 Malignant melanoma of other part of trunk: Secondary | ICD-10-CM | POA: Diagnosis not present

## 2015-09-14 DIAGNOSIS — E785 Hyperlipidemia, unspecified: Secondary | ICD-10-CM | POA: Diagnosis not present

## 2015-09-14 DIAGNOSIS — C44320 Squamous cell carcinoma of skin of unspecified parts of face: Secondary | ICD-10-CM | POA: Diagnosis not present

## 2015-09-14 DIAGNOSIS — I10 Essential (primary) hypertension: Secondary | ICD-10-CM | POA: Diagnosis not present

## 2015-09-14 DIAGNOSIS — C4362 Malignant melanoma of left upper limb, including shoulder: Secondary | ICD-10-CM | POA: Diagnosis not present

## 2015-09-14 DIAGNOSIS — Z483 Aftercare following surgery for neoplasm: Secondary | ICD-10-CM | POA: Diagnosis not present

## 2015-09-17 DIAGNOSIS — C4442 Squamous cell carcinoma of skin of scalp and neck: Secondary | ICD-10-CM | POA: Diagnosis not present

## 2015-09-17 DIAGNOSIS — Z483 Aftercare following surgery for neoplasm: Secondary | ICD-10-CM | POA: Diagnosis not present

## 2015-09-17 DIAGNOSIS — C4362 Malignant melanoma of left upper limb, including shoulder: Secondary | ICD-10-CM | POA: Diagnosis not present

## 2015-09-17 DIAGNOSIS — C44320 Squamous cell carcinoma of skin of unspecified parts of face: Secondary | ICD-10-CM | POA: Diagnosis not present

## 2015-09-17 DIAGNOSIS — E785 Hyperlipidemia, unspecified: Secondary | ICD-10-CM | POA: Diagnosis not present

## 2015-09-17 DIAGNOSIS — C4359 Malignant melanoma of other part of trunk: Secondary | ICD-10-CM | POA: Diagnosis not present

## 2015-09-17 DIAGNOSIS — E119 Type 2 diabetes mellitus without complications: Secondary | ICD-10-CM | POA: Diagnosis not present

## 2015-09-17 DIAGNOSIS — I10 Essential (primary) hypertension: Secondary | ICD-10-CM | POA: Diagnosis not present

## 2015-09-19 DIAGNOSIS — C4359 Malignant melanoma of other part of trunk: Secondary | ICD-10-CM | POA: Diagnosis not present

## 2015-09-19 DIAGNOSIS — I10 Essential (primary) hypertension: Secondary | ICD-10-CM | POA: Diagnosis not present

## 2015-09-19 DIAGNOSIS — C4362 Malignant melanoma of left upper limb, including shoulder: Secondary | ICD-10-CM | POA: Diagnosis not present

## 2015-09-19 DIAGNOSIS — C4442 Squamous cell carcinoma of skin of scalp and neck: Secondary | ICD-10-CM | POA: Diagnosis not present

## 2015-09-19 DIAGNOSIS — E119 Type 2 diabetes mellitus without complications: Secondary | ICD-10-CM | POA: Diagnosis not present

## 2015-09-19 DIAGNOSIS — Z483 Aftercare following surgery for neoplasm: Secondary | ICD-10-CM | POA: Diagnosis not present

## 2015-09-19 DIAGNOSIS — E785 Hyperlipidemia, unspecified: Secondary | ICD-10-CM | POA: Diagnosis not present

## 2015-09-19 DIAGNOSIS — C44320 Squamous cell carcinoma of skin of unspecified parts of face: Secondary | ICD-10-CM | POA: Diagnosis not present

## 2015-09-21 DIAGNOSIS — C4362 Malignant melanoma of left upper limb, including shoulder: Secondary | ICD-10-CM | POA: Diagnosis not present

## 2015-09-21 DIAGNOSIS — C4359 Malignant melanoma of other part of trunk: Secondary | ICD-10-CM | POA: Diagnosis not present

## 2015-09-21 DIAGNOSIS — C44320 Squamous cell carcinoma of skin of unspecified parts of face: Secondary | ICD-10-CM | POA: Diagnosis not present

## 2015-09-21 DIAGNOSIS — C4442 Squamous cell carcinoma of skin of scalp and neck: Secondary | ICD-10-CM | POA: Diagnosis not present

## 2015-09-21 DIAGNOSIS — E785 Hyperlipidemia, unspecified: Secondary | ICD-10-CM | POA: Diagnosis not present

## 2015-09-21 DIAGNOSIS — E119 Type 2 diabetes mellitus without complications: Secondary | ICD-10-CM | POA: Diagnosis not present

## 2015-09-21 DIAGNOSIS — Z483 Aftercare following surgery for neoplasm: Secondary | ICD-10-CM | POA: Diagnosis not present

## 2015-09-21 DIAGNOSIS — I10 Essential (primary) hypertension: Secondary | ICD-10-CM | POA: Diagnosis not present

## 2015-09-24 DIAGNOSIS — E119 Type 2 diabetes mellitus without complications: Secondary | ICD-10-CM | POA: Diagnosis not present

## 2015-09-24 DIAGNOSIS — C4362 Malignant melanoma of left upper limb, including shoulder: Secondary | ICD-10-CM | POA: Diagnosis not present

## 2015-09-24 DIAGNOSIS — C44320 Squamous cell carcinoma of skin of unspecified parts of face: Secondary | ICD-10-CM | POA: Diagnosis not present

## 2015-09-24 DIAGNOSIS — Z483 Aftercare following surgery for neoplasm: Secondary | ICD-10-CM | POA: Diagnosis not present

## 2015-09-24 DIAGNOSIS — C4359 Malignant melanoma of other part of trunk: Secondary | ICD-10-CM | POA: Diagnosis not present

## 2015-09-24 DIAGNOSIS — I10 Essential (primary) hypertension: Secondary | ICD-10-CM | POA: Diagnosis not present

## 2015-09-24 DIAGNOSIS — C4442 Squamous cell carcinoma of skin of scalp and neck: Secondary | ICD-10-CM | POA: Diagnosis not present

## 2015-09-24 DIAGNOSIS — E785 Hyperlipidemia, unspecified: Secondary | ICD-10-CM | POA: Diagnosis not present

## 2015-09-26 DIAGNOSIS — I10 Essential (primary) hypertension: Secondary | ICD-10-CM | POA: Diagnosis not present

## 2015-09-26 DIAGNOSIS — Z483 Aftercare following surgery for neoplasm: Secondary | ICD-10-CM | POA: Diagnosis not present

## 2015-09-26 DIAGNOSIS — C4442 Squamous cell carcinoma of skin of scalp and neck: Secondary | ICD-10-CM | POA: Diagnosis not present

## 2015-09-26 DIAGNOSIS — C4362 Malignant melanoma of left upper limb, including shoulder: Secondary | ICD-10-CM | POA: Diagnosis not present

## 2015-09-26 DIAGNOSIS — E119 Type 2 diabetes mellitus without complications: Secondary | ICD-10-CM | POA: Diagnosis not present

## 2015-09-26 DIAGNOSIS — E785 Hyperlipidemia, unspecified: Secondary | ICD-10-CM | POA: Diagnosis not present

## 2015-09-26 DIAGNOSIS — C44320 Squamous cell carcinoma of skin of unspecified parts of face: Secondary | ICD-10-CM | POA: Diagnosis not present

## 2015-09-26 DIAGNOSIS — C4359 Malignant melanoma of other part of trunk: Secondary | ICD-10-CM | POA: Diagnosis not present

## 2015-09-28 DIAGNOSIS — Z483 Aftercare following surgery for neoplasm: Secondary | ICD-10-CM | POA: Diagnosis not present

## 2015-09-28 DIAGNOSIS — C4362 Malignant melanoma of left upper limb, including shoulder: Secondary | ICD-10-CM | POA: Diagnosis not present

## 2015-09-28 DIAGNOSIS — C4359 Malignant melanoma of other part of trunk: Secondary | ICD-10-CM | POA: Diagnosis not present

## 2015-09-28 DIAGNOSIS — E119 Type 2 diabetes mellitus without complications: Secondary | ICD-10-CM | POA: Diagnosis not present

## 2015-09-28 DIAGNOSIS — C44320 Squamous cell carcinoma of skin of unspecified parts of face: Secondary | ICD-10-CM | POA: Diagnosis not present

## 2015-09-28 DIAGNOSIS — E785 Hyperlipidemia, unspecified: Secondary | ICD-10-CM | POA: Diagnosis not present

## 2015-09-28 DIAGNOSIS — I10 Essential (primary) hypertension: Secondary | ICD-10-CM | POA: Diagnosis not present

## 2015-09-28 DIAGNOSIS — C4442 Squamous cell carcinoma of skin of scalp and neck: Secondary | ICD-10-CM | POA: Diagnosis not present

## 2015-10-01 DIAGNOSIS — C4359 Malignant melanoma of other part of trunk: Secondary | ICD-10-CM | POA: Diagnosis not present

## 2015-10-01 DIAGNOSIS — Z483 Aftercare following surgery for neoplasm: Secondary | ICD-10-CM | POA: Diagnosis not present

## 2015-10-01 DIAGNOSIS — E785 Hyperlipidemia, unspecified: Secondary | ICD-10-CM | POA: Diagnosis not present

## 2015-10-01 DIAGNOSIS — E119 Type 2 diabetes mellitus without complications: Secondary | ICD-10-CM | POA: Diagnosis not present

## 2015-10-01 DIAGNOSIS — C44320 Squamous cell carcinoma of skin of unspecified parts of face: Secondary | ICD-10-CM | POA: Diagnosis not present

## 2015-10-01 DIAGNOSIS — C4442 Squamous cell carcinoma of skin of scalp and neck: Secondary | ICD-10-CM | POA: Diagnosis not present

## 2015-10-01 DIAGNOSIS — I10 Essential (primary) hypertension: Secondary | ICD-10-CM | POA: Diagnosis not present

## 2015-10-01 DIAGNOSIS — C4362 Malignant melanoma of left upper limb, including shoulder: Secondary | ICD-10-CM | POA: Diagnosis not present

## 2015-10-03 DIAGNOSIS — E119 Type 2 diabetes mellitus without complications: Secondary | ICD-10-CM | POA: Diagnosis not present

## 2015-10-03 DIAGNOSIS — C4442 Squamous cell carcinoma of skin of scalp and neck: Secondary | ICD-10-CM | POA: Diagnosis not present

## 2015-10-03 DIAGNOSIS — I10 Essential (primary) hypertension: Secondary | ICD-10-CM | POA: Diagnosis not present

## 2015-10-03 DIAGNOSIS — C44320 Squamous cell carcinoma of skin of unspecified parts of face: Secondary | ICD-10-CM | POA: Diagnosis not present

## 2015-10-03 DIAGNOSIS — C4359 Malignant melanoma of other part of trunk: Secondary | ICD-10-CM | POA: Diagnosis not present

## 2015-10-03 DIAGNOSIS — E785 Hyperlipidemia, unspecified: Secondary | ICD-10-CM | POA: Diagnosis not present

## 2015-10-03 DIAGNOSIS — C4362 Malignant melanoma of left upper limb, including shoulder: Secondary | ICD-10-CM | POA: Diagnosis not present

## 2015-10-03 DIAGNOSIS — Z483 Aftercare following surgery for neoplasm: Secondary | ICD-10-CM | POA: Diagnosis not present

## 2015-10-05 DIAGNOSIS — C4362 Malignant melanoma of left upper limb, including shoulder: Secondary | ICD-10-CM | POA: Diagnosis not present

## 2015-10-05 DIAGNOSIS — C44320 Squamous cell carcinoma of skin of unspecified parts of face: Secondary | ICD-10-CM | POA: Diagnosis not present

## 2015-10-05 DIAGNOSIS — C4442 Squamous cell carcinoma of skin of scalp and neck: Secondary | ICD-10-CM | POA: Diagnosis not present

## 2015-10-05 DIAGNOSIS — E785 Hyperlipidemia, unspecified: Secondary | ICD-10-CM | POA: Diagnosis not present

## 2015-10-05 DIAGNOSIS — C4359 Malignant melanoma of other part of trunk: Secondary | ICD-10-CM | POA: Diagnosis not present

## 2015-10-05 DIAGNOSIS — I10 Essential (primary) hypertension: Secondary | ICD-10-CM | POA: Diagnosis not present

## 2015-10-05 DIAGNOSIS — E119 Type 2 diabetes mellitus without complications: Secondary | ICD-10-CM | POA: Diagnosis not present

## 2015-10-05 DIAGNOSIS — Z483 Aftercare following surgery for neoplasm: Secondary | ICD-10-CM | POA: Diagnosis not present

## 2015-10-08 DIAGNOSIS — C4362 Malignant melanoma of left upper limb, including shoulder: Secondary | ICD-10-CM | POA: Diagnosis not present

## 2015-10-08 DIAGNOSIS — C44320 Squamous cell carcinoma of skin of unspecified parts of face: Secondary | ICD-10-CM | POA: Diagnosis not present

## 2015-10-08 DIAGNOSIS — Z483 Aftercare following surgery for neoplasm: Secondary | ICD-10-CM | POA: Diagnosis not present

## 2015-10-08 DIAGNOSIS — I10 Essential (primary) hypertension: Secondary | ICD-10-CM | POA: Diagnosis not present

## 2015-10-08 DIAGNOSIS — E119 Type 2 diabetes mellitus without complications: Secondary | ICD-10-CM | POA: Diagnosis not present

## 2015-10-08 DIAGNOSIS — E785 Hyperlipidemia, unspecified: Secondary | ICD-10-CM | POA: Diagnosis not present

## 2015-10-08 DIAGNOSIS — C4442 Squamous cell carcinoma of skin of scalp and neck: Secondary | ICD-10-CM | POA: Diagnosis not present

## 2015-10-08 DIAGNOSIS — C4359 Malignant melanoma of other part of trunk: Secondary | ICD-10-CM | POA: Diagnosis not present

## 2015-10-09 DIAGNOSIS — C4442 Squamous cell carcinoma of skin of scalp and neck: Secondary | ICD-10-CM | POA: Diagnosis not present

## 2015-10-09 DIAGNOSIS — C44619 Basal cell carcinoma of skin of left upper limb, including shoulder: Secondary | ICD-10-CM | POA: Diagnosis not present

## 2015-10-09 DIAGNOSIS — Z85820 Personal history of malignant melanoma of skin: Secondary | ICD-10-CM | POA: Diagnosis not present

## 2015-10-09 DIAGNOSIS — D485 Neoplasm of uncertain behavior of skin: Secondary | ICD-10-CM | POA: Diagnosis not present

## 2015-10-09 DIAGNOSIS — L570 Actinic keratosis: Secondary | ICD-10-CM | POA: Diagnosis not present

## 2015-10-10 ENCOUNTER — Ambulatory Visit (INDEPENDENT_AMBULATORY_CARE_PROVIDER_SITE_OTHER): Payer: Medicare Other | Admitting: Family Medicine

## 2015-10-10 VITALS — BP 138/84 | HR 72 | Temp 98.0°F | Resp 18 | Wt 239.0 lb

## 2015-10-10 DIAGNOSIS — I8311 Varicose veins of right lower extremity with inflammation: Secondary | ICD-10-CM

## 2015-10-10 DIAGNOSIS — I872 Venous insufficiency (chronic) (peripheral): Secondary | ICD-10-CM

## 2015-10-10 NOTE — Progress Notes (Signed)
Patient ID: Miguel Torres, male   DOB: February 07, 1931, 82 y.o.   MRN: [id number]   Miguel Torres  MRN: [id number] DOB: 21-Jul-1930  Subjective:  HPI   The patient is an 82 year old male who presents today for the redness and swelling of his left leg.  He was seen recently by his dermatologist and she saw his leg and told the family she felt it needed to be checked and that he probably needed to be using support hose.  The patient and his daughter states that he has for years had trouble with the leg being red and swollen and they do not feel it has changed at all recently.  Patient states it is not painful at  All and there has been no drainage.  The patient states he has had a vascular work up with Dr. Delana Meyer a year or so ago and was told his circulation was ok.    Patient Active Problem List   Diagnosis Date Noted  . Vitamin D deficiency 07/12/2015  . Retinopathy, diabetic, proliferative ([place]) 07/02/2015  . Atrial flutter ([place]) 01/19/2015  . Allergic rhinitis 01/17/2015  . Anemia of chronic disease 01/17/2015  . Chronic kidney disease (CKD), stage III (moderate) 01/17/2015  . Diabetes mellitus with nephropathy ([place]) 01/17/2015  . Edema 01/17/2015  . Gout 01/17/2015  . Amputation of right lower extremity below knee upon examination ([place]) 01/17/2015  . History of other malignant neoplasm of skin 01/17/2015  . HLD (hyperlipidemia) 01/17/2015  . Psoriasis 01/17/2015  . CA of skin 01/17/2015  . Hypertension 10/27/2014  . Mild cognitive disorder 12/05/2013  . Squamous cell carcinoma of scalp and skin of neck 03/03/2011    Past Medical History  Diagnosis Date  . Hyperlipidemia   . Hypertension   . Anemia     Social History   Social History  . Marital Status: Married    Spouse Name: N/A  . Number of Children: 5  . Years of Education: 5th grade   Occupational History  . Retired    Social History Main Topics  . Smoking status: Never Smoker   . Smokeless tobacco: Never  Used  . Alcohol Use: No  . Drug Use: No  . Sexual Activity: Not on file   Other Topics Concern  . Not on file   Social History Narrative    Outpatient Prescriptions Prior to Visit  Medication Sig Dispense Refill  . acetaminophen (TYLENOL) 325 MG tablet Take 650 mg by mouth. As needed    . allopurinol (ZYLOPRIM) 300 MG tablet Take 300 mg by mouth 2 (two) times daily.    [name] atenolol-chlorthalidone (TENORETIC) 50-25 MG tablet Take 1 tablet by mouth daily. 90 tablet 3  . felodipine (PLENDIL) 2.5 MG 24 hr tablet Take 1 tablet (2.5 mg total) by mouth daily. 90 tablet 4  . ferrous sulfate 325 (65 FE) MG tablet Take 1 tablet by mouth daily.    . fluocinonide ointment (LIDEX) 0.05 % FLUOCINONIDE, 0.05% (External Ointment) - Historical Medication  (0.05 %) Active    . furosemide (LASIX) 20 MG tablet Take 1 tablet by mouth daily as needed. For swelling    . glipiZIDE (GLUCOTROL) 10 MG tablet Take 1 tablet (10 mg total) by mouth daily. 90 tablet 0  . HYDROcodone-acetaminophen (NORCO/VICODIN) 5-325 MG per tablet Take 1 tablet by mouth every 6 (six) hours as needed.    . indomethacin (INDOCIN) 50 MG capsule Take 1 capsule by mouth 3 (three) times daily.    [name]  loratadine (CLARITIN) 10 MG tablet Take 10 mg by mouth daily.    [name] lovastatin (MEVACOR) 40 MG tablet Take 1 tablet (40 mg total) by mouth daily. 90 tablet 0  . metFORMIN (GLUCOPHAGE-XR) 500 MG 24 hr tablet Take 1 tablet (500 mg total) by mouth daily. 90 tablet 3  . montelukast (SINGULAIR) 10 MG tablet Take 1 tablet (10 mg total) by mouth every evening. 90 tablet 0  . pioglitazone (ACTOS) 30 MG tablet Take 1 tablet (30 mg total) by mouth daily. 90 tablet 0  . polyethylene glycol (MIRALAX / GLYCOLAX) packet Take by mouth.    . tamsulosin (FLOMAX) 0.4 MG CAPS capsule Take 0.4 mg by mouth. Reported on 07/11/2015    . triamcinolone lotion (KENALOG) 0.1 % TRIAMCINOLONE ACETONIDE, 0.1% (External Lotion)  1 application daily for 0 days  Quantity: 120;   Refills: 3   Ordered :26-November-2010  [name] ;  Started 26-November-2010 Active    . Vitamin D, Ergocalciferol, (DRISDOL) 50000 units CAPS capsule Take 1 capsule (50,000 Units total) by mouth every 7 (seven) days. 12 capsule 3   No facility-administered medications prior to visit.    No Known Allergies  Review of Systems  Constitutional: Negative for fever and malaise/fatigue.  HENT: Positive for hearing loss.   Respiratory: Negative for cough, shortness of breath and wheezing.   Cardiovascular: Positive for leg swelling. Negative for chest pain.  Neurological: Negative for weakness.   Objective:  BP 138/84 mmHg  Pulse 72  Temp(Src) 98 F (36.7 C) (Oral)  Resp 18  Wt 239 lb (108.41 kg)  Physical Exam  General appearance: alert, well developed, well nourished, cooperative and in no distress Head: Normocephalic, without obvious abnormality, atraumatic Respiratory: Respirations even and unlabored, normal respiratory rate Extremity: 3+ RLE edema. About 3/5 anterior lower leg with dark eblanching erythema. Non tender. Several hyperkeratotic patches, but no open sores. Not warm to touch.  Neurologic: Mental status: Alert, oriented to person, place, and time, thought content appropriate.   Assessment and Plan :   1. Venous stasis dermatitis of right lower extremity No signficant change from baseline. Will start wearing compression stocking. Given prescription for 65mmHg. Recheck in a month along with follow up his other chronic conditions.    Lelon Huh.  [facility] 10/10/2015 4:26 PM

## 2015-10-11 ENCOUNTER — Telehealth: Payer: Self-pay | Admitting: Family Medicine

## 2015-10-11 NOTE — Telephone Encounter (Signed)
That's fine. Are they [facility]? Or do they need referral for home health. Can send order to [facility] if that is who they are using.

## 2015-10-11 NOTE — Telephone Encounter (Signed)
Miguel Torres stated that thay do use [facility]. Patient's nurse is Mendel Ryder and she comes out on MWF every week.

## 2015-10-11 NOTE — Telephone Encounter (Signed)
Please advise 

## 2015-10-11 NOTE — Telephone Encounter (Signed)
Daughter called wanting to know instead of compression hose can she get an order for home health care to wrap his legs each time they come and check on him.  She said she does not think her mom can get the compression hose up his leg.  Her call back is [phone]  Thanks. Miguel Torres

## 2015-10-12 DIAGNOSIS — E785 Hyperlipidemia, unspecified: Secondary | ICD-10-CM | POA: Diagnosis not present

## 2015-10-12 DIAGNOSIS — C4362 Malignant melanoma of left upper limb, including shoulder: Secondary | ICD-10-CM | POA: Diagnosis not present

## 2015-10-12 DIAGNOSIS — C4442 Squamous cell carcinoma of skin of scalp and neck: Secondary | ICD-10-CM | POA: Diagnosis not present

## 2015-10-12 DIAGNOSIS — Z483 Aftercare following surgery for neoplasm: Secondary | ICD-10-CM | POA: Diagnosis not present

## 2015-10-12 DIAGNOSIS — E119 Type 2 diabetes mellitus without complications: Secondary | ICD-10-CM | POA: Diagnosis not present

## 2015-10-12 DIAGNOSIS — C4359 Malignant melanoma of other part of trunk: Secondary | ICD-10-CM | POA: Diagnosis not present

## 2015-10-12 DIAGNOSIS — C44320 Squamous cell carcinoma of skin of unspecified parts of face: Secondary | ICD-10-CM | POA: Diagnosis not present

## 2015-10-12 DIAGNOSIS — I10 Essential (primary) hypertension: Secondary | ICD-10-CM | POA: Diagnosis not present

## 2015-10-12 NOTE — Telephone Encounter (Signed)
Ria Comment from [facility] called in regards to whether it is ok to wrap the patients legs. I advised her that per Dr. Caryn Section, that was fine. Ria Comment states that she comes out to see the patient 2 times a week. She plans to wrap his leg with an unna boot and change it weekly. I consulted with Dr. Caryn Section on whether it was ok to wrap leg in an Unna boot. Per Dr. Caryn Section that is fine. Verbal order given to [place].

## 2015-10-19 DIAGNOSIS — Z483 Aftercare following surgery for neoplasm: Secondary | ICD-10-CM | POA: Diagnosis not present

## 2015-10-19 DIAGNOSIS — C4442 Squamous cell carcinoma of skin of scalp and neck: Secondary | ICD-10-CM | POA: Diagnosis not present

## 2015-10-19 DIAGNOSIS — C44320 Squamous cell carcinoma of skin of unspecified parts of face: Secondary | ICD-10-CM | POA: Diagnosis not present

## 2015-10-19 DIAGNOSIS — E785 Hyperlipidemia, unspecified: Secondary | ICD-10-CM | POA: Diagnosis not present

## 2015-10-19 DIAGNOSIS — C4359 Malignant melanoma of other part of trunk: Secondary | ICD-10-CM | POA: Diagnosis not present

## 2015-10-19 DIAGNOSIS — C4362 Malignant melanoma of left upper limb, including shoulder: Secondary | ICD-10-CM | POA: Diagnosis not present

## 2015-10-19 DIAGNOSIS — I10 Essential (primary) hypertension: Secondary | ICD-10-CM | POA: Diagnosis not present

## 2015-10-19 DIAGNOSIS — E119 Type 2 diabetes mellitus without complications: Secondary | ICD-10-CM | POA: Diagnosis not present

## 2015-10-25 DIAGNOSIS — Z483 Aftercare following surgery for neoplasm: Secondary | ICD-10-CM | POA: Diagnosis not present

## 2015-10-26 DIAGNOSIS — E119 Type 2 diabetes mellitus without complications: Secondary | ICD-10-CM | POA: Diagnosis not present

## 2015-10-26 DIAGNOSIS — Z483 Aftercare following surgery for neoplasm: Secondary | ICD-10-CM | POA: Diagnosis not present

## 2015-10-26 DIAGNOSIS — C44320 Squamous cell carcinoma of skin of unspecified parts of face: Secondary | ICD-10-CM | POA: Diagnosis not present

## 2015-10-26 DIAGNOSIS — C4359 Malignant melanoma of other part of trunk: Secondary | ICD-10-CM | POA: Diagnosis not present

## 2015-10-26 DIAGNOSIS — C4442 Squamous cell carcinoma of skin of scalp and neck: Secondary | ICD-10-CM | POA: Diagnosis not present

## 2015-10-26 DIAGNOSIS — C4362 Malignant melanoma of left upper limb, including shoulder: Secondary | ICD-10-CM | POA: Diagnosis not present

## 2015-10-26 DIAGNOSIS — I10 Essential (primary) hypertension: Secondary | ICD-10-CM | POA: Diagnosis not present

## 2015-10-26 DIAGNOSIS — E785 Hyperlipidemia, unspecified: Secondary | ICD-10-CM | POA: Diagnosis not present

## 2015-10-27 ENCOUNTER — Other Ambulatory Visit: Payer: Self-pay | Admitting: Family Medicine

## 2015-10-29 ENCOUNTER — Telehealth: Payer: Self-pay | Admitting: Family Medicine

## 2015-10-29 DIAGNOSIS — C44320 Squamous cell carcinoma of skin of unspecified parts of face: Secondary | ICD-10-CM | POA: Diagnosis not present

## 2015-10-29 DIAGNOSIS — E785 Hyperlipidemia, unspecified: Secondary | ICD-10-CM | POA: Diagnosis not present

## 2015-10-29 DIAGNOSIS — E119 Type 2 diabetes mellitus without complications: Secondary | ICD-10-CM | POA: Diagnosis not present

## 2015-10-29 DIAGNOSIS — I10 Essential (primary) hypertension: Secondary | ICD-10-CM | POA: Diagnosis not present

## 2015-10-29 DIAGNOSIS — Z483 Aftercare following surgery for neoplasm: Secondary | ICD-10-CM | POA: Diagnosis not present

## 2015-10-29 DIAGNOSIS — C4359 Malignant melanoma of other part of trunk: Secondary | ICD-10-CM | POA: Diagnosis not present

## 2015-10-29 DIAGNOSIS — C4362 Malignant melanoma of left upper limb, including shoulder: Secondary | ICD-10-CM | POA: Diagnosis not present

## 2015-10-29 DIAGNOSIS — C4442 Squamous cell carcinoma of skin of scalp and neck: Secondary | ICD-10-CM | POA: Diagnosis not present

## 2015-10-29 NOTE — Telephone Encounter (Signed)
Please continue home health visit and changing unnaboots weekly.

## 2015-10-29 NOTE — Telephone Encounter (Signed)
Mendel Ryder from [facility] needing a verbal order to continue home health care to pt.  Thanks CC.

## 2015-10-29 NOTE — Telephone Encounter (Signed)
Called and spoke with Mendel Ryder from [facility]. She states that Dr. Manley Mason was previously signing orders for [facility] when patient had skin cancer on his head. Since the skin cancers have healed, Dr. Shelda Jakes does not want to sign orders for home health care. Mendel Ryder says that she has been going out to the patients home to change his Rolena Infante once a week (which this order was ordered by Dr. Caryn Section). If Dr. Caryn Section still wants her to continue going out to the patients home to change the Unna boot once a week, then she will need a verbal order. She needs this order before the end of this week. Mendel Ryder was advised that Dr. Caryn Section is out of the office this week and a message would be sent back.

## 2015-10-30 ENCOUNTER — Other Ambulatory Visit: Payer: Self-pay | Admitting: Family Medicine

## 2015-10-30 NOTE — Telephone Encounter (Signed)
Left detailed message on Lindsey's vm. Advised her to return call.

## 2015-11-02 DIAGNOSIS — I10 Essential (primary) hypertension: Secondary | ICD-10-CM | POA: Diagnosis not present

## 2015-11-02 DIAGNOSIS — Z483 Aftercare following surgery for neoplasm: Secondary | ICD-10-CM | POA: Diagnosis not present

## 2015-11-02 DIAGNOSIS — C4362 Malignant melanoma of left upper limb, including shoulder: Secondary | ICD-10-CM | POA: Diagnosis not present

## 2015-11-02 DIAGNOSIS — E119 Type 2 diabetes mellitus without complications: Secondary | ICD-10-CM | POA: Diagnosis not present

## 2015-11-02 DIAGNOSIS — C44320 Squamous cell carcinoma of skin of unspecified parts of face: Secondary | ICD-10-CM | POA: Diagnosis not present

## 2015-11-02 DIAGNOSIS — C4359 Malignant melanoma of other part of trunk: Secondary | ICD-10-CM | POA: Diagnosis not present

## 2015-11-02 DIAGNOSIS — E785 Hyperlipidemia, unspecified: Secondary | ICD-10-CM | POA: Diagnosis not present

## 2015-11-02 DIAGNOSIS — C4442 Squamous cell carcinoma of skin of scalp and neck: Secondary | ICD-10-CM | POA: Diagnosis not present

## 2015-11-08 DIAGNOSIS — C44619 Basal cell carcinoma of skin of left upper limb, including shoulder: Secondary | ICD-10-CM | POA: Diagnosis not present

## 2015-11-09 DIAGNOSIS — C4359 Malignant melanoma of other part of trunk: Secondary | ICD-10-CM | POA: Diagnosis not present

## 2015-11-09 DIAGNOSIS — C4362 Malignant melanoma of left upper limb, including shoulder: Secondary | ICD-10-CM | POA: Diagnosis not present

## 2015-11-09 DIAGNOSIS — E119 Type 2 diabetes mellitus without complications: Secondary | ICD-10-CM | POA: Diagnosis not present

## 2015-11-09 DIAGNOSIS — I10 Essential (primary) hypertension: Secondary | ICD-10-CM | POA: Diagnosis not present

## 2015-11-09 DIAGNOSIS — C4442 Squamous cell carcinoma of skin of scalp and neck: Secondary | ICD-10-CM | POA: Diagnosis not present

## 2015-11-09 DIAGNOSIS — C44320 Squamous cell carcinoma of skin of unspecified parts of face: Secondary | ICD-10-CM | POA: Diagnosis not present

## 2015-11-09 DIAGNOSIS — Z483 Aftercare following surgery for neoplasm: Secondary | ICD-10-CM | POA: Diagnosis not present

## 2015-11-09 DIAGNOSIS — E785 Hyperlipidemia, unspecified: Secondary | ICD-10-CM | POA: Diagnosis not present

## 2015-11-12 ENCOUNTER — Encounter: Payer: Self-pay | Admitting: Family Medicine

## 2015-11-12 ENCOUNTER — Ambulatory Visit (INDEPENDENT_AMBULATORY_CARE_PROVIDER_SITE_OTHER): Payer: Medicare Other | Admitting: Family Medicine

## 2015-11-12 VITALS — BP 118/64 | HR 60 | Temp 97.6°F | Resp 16 | Wt 235.0 lb

## 2015-11-12 DIAGNOSIS — I10 Essential (primary) hypertension: Secondary | ICD-10-CM

## 2015-11-12 DIAGNOSIS — E785 Hyperlipidemia, unspecified: Secondary | ICD-10-CM | POA: Diagnosis not present

## 2015-11-12 DIAGNOSIS — E1121 Type 2 diabetes mellitus with diabetic nephropathy: Secondary | ICD-10-CM | POA: Diagnosis not present

## 2015-11-12 LAB — POCT GLYCOSYLATED HEMOGLOBIN (HGB A1C): HEMOGLOBIN A1C: 7.2

## 2015-11-12 NOTE — Progress Notes (Signed)
Patient: Miguel Torres Male    DOB: June 02, 1930   82 y.o.   MRN: [id number] Visit Date: 11/12/2015  Today's Provider: Lelon Huh, MD   Chief Complaint  Patient presents with  . Hypertension  . Diabetes  . Hyperlipidemia   Subjective:    HPI  Diabetes Mellitus Type II, Follow-up:   Lab Results  Component Value Date   HGBA1C 7.2 11/12/2015   HGBA1C 6.9 07/11/2015   HGBA1C 7.6* 01/19/2015   Last seen for diabetes 3 months ago.  Management since then includes None. He reports excellent compliance with treatment. He is not having side effects.  Current symptoms include none and have been improving. Home blood sugar records: Pt does not check his blood sugars at home.  Episodes of hypoglycemia? no  Most Recent Eye Exam: [facility]TE Weight trend: stable Current diet: in general, a "healthy" diet   Current exercise: Not much exercise; but does walk some.  ------------------------------------------------------------------------   Hypertension, follow-up:  BP Readings from Last 3 Encounters:  11/12/15 118/64  10/10/15 138/84  07/11/15 122/60    He was last seen for hypertension 3 months ago.  Management since that visit includes None .He reports excellent compliance with treatment. He is not having side effects.  He is exercising. He is adherent to low salt diet.   Outside blood pressures are Pt reports his nurse checks his blood pressure once a week and it is good. He is experiencing none.  Patient denies chest pain, fatigue, irregular heart beat and lower extremity edema.   Cardiovascular risk factors include advanced age (older than 40 for men, 43 for women), diabetes mellitus, dyslipidemia and male gender.  -----------------------------------------------------------------------    Lipid/Cholesterol, Follow-up:   Last seen for this 3 months ago.  Management since that visit includes None.  Last Lipid Panel:    Component Value Date/Time   CHOL  103 01/19/2015 1623   TRIG 121 01/19/2015 1623   HDL 43 01/19/2015 1623   CHOLHDL 2.4 01/19/2015 1623   LDLCALC 36 01/19/2015 1623    He reports excellent compliance with treatment. He is not having side effects.   Wt Readings from Last 3 Encounters:  11/12/15 235 lb (106.595 kg)  10/10/15 239 lb (108.41 kg)  07/11/15 241 lb (109.317 kg)    ------------------------------------------------------------------------       No Known Allergies Current Meds  Medication Sig  . allopurinol (ZYLOPRIM) 300 MG tablet Take 300 mg by mouth 2 (two) times daily.  . felodipine (PLENDIL) 2.5 MG 24 hr tablet Take 1 tablet (2.5 mg total) by mouth daily.  . ferrous sulfate 325 (65 FE) MG tablet Take 1 tablet by mouth daily.  . fluocinonide ointment (LIDEX) 0.05 % FLUOCINONIDE, 0.05% (External Ointment) - Historical Medication  (0.05 %) Active  . furosemide (LASIX) 20 MG tablet Take 1 tablet by mouth daily as needed. Reported on 11/13/2015  . glipiZIDE (GLUCOTROL) 10 MG tablet TAKE 1 TABLET (10 MG TOTAL) BY MOUTH DAILY.  [name] loratadine (CLARITIN) 10 MG tablet Take 10 mg by mouth daily.  [name] lovastatin (MEVACOR) 40 MG tablet TAKE 1 TABLET (40 MG TOTAL) BY MOUTH DAILY.  . metFORMIN (GLUCOPHAGE-XR) 500 MG 24 hr tablet Take 1 tablet (500 mg total) by mouth daily.  . montelukast (SINGULAIR) 10 MG tablet TAKE 1 TABLET (10 MG TOTAL) BY MOUTH EVERY EVENING.  . pioglitazone (ACTOS) 30 MG tablet TAKE 1 TABLET (30 MG TOTAL) BY MOUTH DAILY.  [name] polyethylene glycol (  MIRALAX / GLYCOLAX) packet Take by mouth.  . triamcinolone lotion (KENALOG) 0.1 % TRIAMCINOLONE ACETONIDE, 0.1% (External Lotion)  1 application daily for 0 days  Quantity: 120;  Refills: 3   Ordered :26-November-2010  [name] ;  Started 26-November-2010 Active  . Vitamin D, Ergocalciferol, (DRISDOL) 50000 units CAPS capsule Take 1 capsule (50,000 Units total) by mouth every 7 (seven) days.    Review of Systems  Constitutional: Negative.     Respiratory: Negative.   Cardiovascular: Negative.   Gastrointestinal: Negative.   Endocrine: Negative.   Musculoskeletal: Negative.   Neurological: Negative for dizziness, weakness, light-headedness, numbness and headaches.    Social History  Substance Use Topics  . Smoking status: Never Smoker   . Smokeless tobacco: Never Used  . Alcohol Use: No   Objective:   BP 118/64 mmHg  Pulse 60  Temp(Src) 97.6 F (36.4 C) (Oral)  Resp 16  Wt 235 lb (106.595 kg)  Results for orders placed or performed in visit on 11/12/15  POCT glycosylated hemoglobin (Hb A1C)  Result Value Ref Range   Hemoglobin A1C 7.2     Physical Exam   General Appearance:    Alert, cooperative, no distress  Eyes:    PERRL, conjunctiva/corneas clear, EOM's intact       Lungs:     Clear to auscultation bilaterally, respirations unlabored  Heart:    Regular rate and rhythm  Neurologic:   Awake, alert, oriented x 3. No apparent focal neurological           defect.       Results for orders placed or performed in visit on 11/12/15  POCT glycosylated hemoglobin (Hb A1C)  Result Value Ref Range   Hemoglobin A1C 7.2        Assessment & Plan:     1. Essential hypertension Well controlled.  Continue current medications.    2. Diabetes mellitus with nephropathy ([facility]) A1c at goal. Continue current medications.  Follow up 4 monhts.  - POCT glycosylated hemoglobin (Hb A1C)  3. HLD (hyperlipidemia) He is tolerating lovastatin well with no adverse effects.         Lelon Huh, MD  [facility]

## 2015-11-14 DIAGNOSIS — L578 Other skin changes due to chronic exposure to nonionizing radiation: Secondary | ICD-10-CM | POA: Diagnosis not present

## 2015-11-14 DIAGNOSIS — Z85828 Personal history of other malignant neoplasm of skin: Secondary | ICD-10-CM | POA: Diagnosis not present

## 2015-11-14 DIAGNOSIS — L905 Scar conditions and fibrosis of skin: Secondary | ICD-10-CM | POA: Diagnosis not present

## 2015-11-14 DIAGNOSIS — C4442 Squamous cell carcinoma of skin of scalp and neck: Secondary | ICD-10-CM | POA: Diagnosis not present

## 2015-11-14 DIAGNOSIS — L908 Other atrophic disorders of skin: Secondary | ICD-10-CM | POA: Diagnosis not present

## 2015-11-15 DIAGNOSIS — Z483 Aftercare following surgery for neoplasm: Secondary | ICD-10-CM | POA: Diagnosis not present

## 2015-11-15 DIAGNOSIS — E119 Type 2 diabetes mellitus without complications: Secondary | ICD-10-CM | POA: Diagnosis not present

## 2015-11-15 DIAGNOSIS — C4442 Squamous cell carcinoma of skin of scalp and neck: Secondary | ICD-10-CM | POA: Diagnosis not present

## 2015-11-15 DIAGNOSIS — C44320 Squamous cell carcinoma of skin of unspecified parts of face: Secondary | ICD-10-CM | POA: Diagnosis not present

## 2015-11-15 DIAGNOSIS — I10 Essential (primary) hypertension: Secondary | ICD-10-CM | POA: Diagnosis not present

## 2015-11-15 DIAGNOSIS — E785 Hyperlipidemia, unspecified: Secondary | ICD-10-CM | POA: Diagnosis not present

## 2015-11-15 DIAGNOSIS — C4359 Malignant melanoma of other part of trunk: Secondary | ICD-10-CM | POA: Diagnosis not present

## 2015-11-15 DIAGNOSIS — C4362 Malignant melanoma of left upper limb, including shoulder: Secondary | ICD-10-CM | POA: Diagnosis not present

## 2015-11-16 DIAGNOSIS — E119 Type 2 diabetes mellitus without complications: Secondary | ICD-10-CM | POA: Diagnosis not present

## 2015-11-16 DIAGNOSIS — Z483 Aftercare following surgery for neoplasm: Secondary | ICD-10-CM | POA: Diagnosis not present

## 2015-11-16 DIAGNOSIS — C44320 Squamous cell carcinoma of skin of unspecified parts of face: Secondary | ICD-10-CM | POA: Diagnosis not present

## 2015-11-16 DIAGNOSIS — C4442 Squamous cell carcinoma of skin of scalp and neck: Secondary | ICD-10-CM | POA: Diagnosis not present

## 2015-11-16 DIAGNOSIS — I10 Essential (primary) hypertension: Secondary | ICD-10-CM | POA: Diagnosis not present

## 2015-11-16 DIAGNOSIS — C4359 Malignant melanoma of other part of trunk: Secondary | ICD-10-CM | POA: Diagnosis not present

## 2015-11-16 DIAGNOSIS — C4362 Malignant melanoma of left upper limb, including shoulder: Secondary | ICD-10-CM | POA: Diagnosis not present

## 2015-11-16 DIAGNOSIS — E785 Hyperlipidemia, unspecified: Secondary | ICD-10-CM | POA: Diagnosis not present

## 2015-11-19 DIAGNOSIS — Z483 Aftercare following surgery for neoplasm: Secondary | ICD-10-CM | POA: Diagnosis not present

## 2015-11-19 DIAGNOSIS — I10 Essential (primary) hypertension: Secondary | ICD-10-CM | POA: Diagnosis not present

## 2015-11-19 DIAGNOSIS — C44320 Squamous cell carcinoma of skin of unspecified parts of face: Secondary | ICD-10-CM | POA: Diagnosis not present

## 2015-11-19 DIAGNOSIS — E785 Hyperlipidemia, unspecified: Secondary | ICD-10-CM | POA: Diagnosis not present

## 2015-11-19 DIAGNOSIS — C4362 Malignant melanoma of left upper limb, including shoulder: Secondary | ICD-10-CM | POA: Diagnosis not present

## 2015-11-19 DIAGNOSIS — C4359 Malignant melanoma of other part of trunk: Secondary | ICD-10-CM | POA: Diagnosis not present

## 2015-11-19 DIAGNOSIS — E119 Type 2 diabetes mellitus without complications: Secondary | ICD-10-CM | POA: Diagnosis not present

## 2015-11-19 DIAGNOSIS — C4442 Squamous cell carcinoma of skin of scalp and neck: Secondary | ICD-10-CM | POA: Diagnosis not present

## 2015-11-21 DIAGNOSIS — C4442 Squamous cell carcinoma of skin of scalp and neck: Secondary | ICD-10-CM | POA: Diagnosis not present

## 2015-11-21 DIAGNOSIS — C44320 Squamous cell carcinoma of skin of unspecified parts of face: Secondary | ICD-10-CM | POA: Diagnosis not present

## 2015-11-21 DIAGNOSIS — Z483 Aftercare following surgery for neoplasm: Secondary | ICD-10-CM | POA: Diagnosis not present

## 2015-11-21 DIAGNOSIS — I10 Essential (primary) hypertension: Secondary | ICD-10-CM | POA: Diagnosis not present

## 2015-11-21 DIAGNOSIS — C4359 Malignant melanoma of other part of trunk: Secondary | ICD-10-CM | POA: Diagnosis not present

## 2015-11-21 DIAGNOSIS — E785 Hyperlipidemia, unspecified: Secondary | ICD-10-CM | POA: Diagnosis not present

## 2015-11-21 DIAGNOSIS — C4362 Malignant melanoma of left upper limb, including shoulder: Secondary | ICD-10-CM | POA: Diagnosis not present

## 2015-11-21 DIAGNOSIS — E119 Type 2 diabetes mellitus without complications: Secondary | ICD-10-CM | POA: Diagnosis not present

## 2015-11-22 DIAGNOSIS — C44619 Basal cell carcinoma of skin of left upper limb, including shoulder: Secondary | ICD-10-CM | POA: Diagnosis not present

## 2015-11-23 DIAGNOSIS — Z483 Aftercare following surgery for neoplasm: Secondary | ICD-10-CM | POA: Diagnosis not present

## 2015-11-23 DIAGNOSIS — E785 Hyperlipidemia, unspecified: Secondary | ICD-10-CM | POA: Diagnosis not present

## 2015-11-23 DIAGNOSIS — C4359 Malignant melanoma of other part of trunk: Secondary | ICD-10-CM | POA: Diagnosis not present

## 2015-11-23 DIAGNOSIS — C4362 Malignant melanoma of left upper limb, including shoulder: Secondary | ICD-10-CM | POA: Diagnosis not present

## 2015-11-23 DIAGNOSIS — E119 Type 2 diabetes mellitus without complications: Secondary | ICD-10-CM | POA: Diagnosis not present

## 2015-11-23 DIAGNOSIS — C44320 Squamous cell carcinoma of skin of unspecified parts of face: Secondary | ICD-10-CM | POA: Diagnosis not present

## 2015-11-23 DIAGNOSIS — C4442 Squamous cell carcinoma of skin of scalp and neck: Secondary | ICD-10-CM | POA: Diagnosis not present

## 2015-11-23 DIAGNOSIS — I10 Essential (primary) hypertension: Secondary | ICD-10-CM | POA: Diagnosis not present

## 2015-11-26 DIAGNOSIS — Z483 Aftercare following surgery for neoplasm: Secondary | ICD-10-CM | POA: Diagnosis not present

## 2015-11-26 DIAGNOSIS — E119 Type 2 diabetes mellitus without complications: Secondary | ICD-10-CM | POA: Diagnosis not present

## 2015-11-26 DIAGNOSIS — C4359 Malignant melanoma of other part of trunk: Secondary | ICD-10-CM | POA: Diagnosis not present

## 2015-11-26 DIAGNOSIS — I10 Essential (primary) hypertension: Secondary | ICD-10-CM | POA: Diagnosis not present

## 2015-11-26 DIAGNOSIS — C4442 Squamous cell carcinoma of skin of scalp and neck: Secondary | ICD-10-CM | POA: Diagnosis not present

## 2015-11-26 DIAGNOSIS — E785 Hyperlipidemia, unspecified: Secondary | ICD-10-CM | POA: Diagnosis not present

## 2015-11-26 DIAGNOSIS — C44320 Squamous cell carcinoma of skin of unspecified parts of face: Secondary | ICD-10-CM | POA: Diagnosis not present

## 2015-11-26 DIAGNOSIS — C4362 Malignant melanoma of left upper limb, including shoulder: Secondary | ICD-10-CM | POA: Diagnosis not present

## 2015-11-28 DIAGNOSIS — C4359 Malignant melanoma of other part of trunk: Secondary | ICD-10-CM | POA: Diagnosis not present

## 2015-11-28 DIAGNOSIS — C4362 Malignant melanoma of left upper limb, including shoulder: Secondary | ICD-10-CM | POA: Diagnosis not present

## 2015-11-28 DIAGNOSIS — Z483 Aftercare following surgery for neoplasm: Secondary | ICD-10-CM | POA: Diagnosis not present

## 2015-11-28 DIAGNOSIS — E785 Hyperlipidemia, unspecified: Secondary | ICD-10-CM | POA: Diagnosis not present

## 2015-11-28 DIAGNOSIS — E119 Type 2 diabetes mellitus without complications: Secondary | ICD-10-CM | POA: Diagnosis not present

## 2015-11-28 DIAGNOSIS — I10 Essential (primary) hypertension: Secondary | ICD-10-CM | POA: Diagnosis not present

## 2015-11-28 DIAGNOSIS — C4442 Squamous cell carcinoma of skin of scalp and neck: Secondary | ICD-10-CM | POA: Diagnosis not present

## 2015-11-28 DIAGNOSIS — C44320 Squamous cell carcinoma of skin of unspecified parts of face: Secondary | ICD-10-CM | POA: Diagnosis not present

## 2015-12-03 DIAGNOSIS — E119 Type 2 diabetes mellitus without complications: Secondary | ICD-10-CM | POA: Diagnosis not present

## 2015-12-03 DIAGNOSIS — I10 Essential (primary) hypertension: Secondary | ICD-10-CM | POA: Diagnosis not present

## 2015-12-03 DIAGNOSIS — C44320 Squamous cell carcinoma of skin of unspecified parts of face: Secondary | ICD-10-CM | POA: Diagnosis not present

## 2015-12-03 DIAGNOSIS — Z483 Aftercare following surgery for neoplasm: Secondary | ICD-10-CM | POA: Diagnosis not present

## 2015-12-03 DIAGNOSIS — C4359 Malignant melanoma of other part of trunk: Secondary | ICD-10-CM | POA: Diagnosis not present

## 2015-12-03 DIAGNOSIS — C4362 Malignant melanoma of left upper limb, including shoulder: Secondary | ICD-10-CM | POA: Diagnosis not present

## 2015-12-03 DIAGNOSIS — C4442 Squamous cell carcinoma of skin of scalp and neck: Secondary | ICD-10-CM | POA: Diagnosis not present

## 2015-12-03 DIAGNOSIS — E785 Hyperlipidemia, unspecified: Secondary | ICD-10-CM | POA: Diagnosis not present

## 2015-12-05 DIAGNOSIS — E785 Hyperlipidemia, unspecified: Secondary | ICD-10-CM | POA: Diagnosis not present

## 2015-12-05 DIAGNOSIS — I10 Essential (primary) hypertension: Secondary | ICD-10-CM | POA: Diagnosis not present

## 2015-12-05 DIAGNOSIS — C44320 Squamous cell carcinoma of skin of unspecified parts of face: Secondary | ICD-10-CM | POA: Diagnosis not present

## 2015-12-05 DIAGNOSIS — Z483 Aftercare following surgery for neoplasm: Secondary | ICD-10-CM | POA: Diagnosis not present

## 2015-12-05 DIAGNOSIS — E119 Type 2 diabetes mellitus without complications: Secondary | ICD-10-CM | POA: Diagnosis not present

## 2015-12-05 DIAGNOSIS — C4359 Malignant melanoma of other part of trunk: Secondary | ICD-10-CM | POA: Diagnosis not present

## 2015-12-05 DIAGNOSIS — C4362 Malignant melanoma of left upper limb, including shoulder: Secondary | ICD-10-CM | POA: Diagnosis not present

## 2015-12-05 DIAGNOSIS — C4442 Squamous cell carcinoma of skin of scalp and neck: Secondary | ICD-10-CM | POA: Diagnosis not present

## 2015-12-07 DIAGNOSIS — Z483 Aftercare following surgery for neoplasm: Secondary | ICD-10-CM | POA: Diagnosis not present

## 2015-12-07 DIAGNOSIS — C4442 Squamous cell carcinoma of skin of scalp and neck: Secondary | ICD-10-CM | POA: Diagnosis not present

## 2015-12-07 DIAGNOSIS — C4359 Malignant melanoma of other part of trunk: Secondary | ICD-10-CM | POA: Diagnosis not present

## 2015-12-07 DIAGNOSIS — C44320 Squamous cell carcinoma of skin of unspecified parts of face: Secondary | ICD-10-CM | POA: Diagnosis not present

## 2015-12-07 DIAGNOSIS — C4362 Malignant melanoma of left upper limb, including shoulder: Secondary | ICD-10-CM | POA: Diagnosis not present

## 2015-12-07 DIAGNOSIS — E119 Type 2 diabetes mellitus without complications: Secondary | ICD-10-CM | POA: Diagnosis not present

## 2015-12-07 DIAGNOSIS — E785 Hyperlipidemia, unspecified: Secondary | ICD-10-CM | POA: Diagnosis not present

## 2015-12-07 DIAGNOSIS — I10 Essential (primary) hypertension: Secondary | ICD-10-CM | POA: Diagnosis not present

## 2015-12-10 DIAGNOSIS — E119 Type 2 diabetes mellitus without complications: Secondary | ICD-10-CM | POA: Diagnosis not present

## 2015-12-10 DIAGNOSIS — C4362 Malignant melanoma of left upper limb, including shoulder: Secondary | ICD-10-CM | POA: Diagnosis not present

## 2015-12-10 DIAGNOSIS — C44320 Squamous cell carcinoma of skin of unspecified parts of face: Secondary | ICD-10-CM | POA: Diagnosis not present

## 2015-12-10 DIAGNOSIS — Z483 Aftercare following surgery for neoplasm: Secondary | ICD-10-CM | POA: Diagnosis not present

## 2015-12-10 DIAGNOSIS — C4442 Squamous cell carcinoma of skin of scalp and neck: Secondary | ICD-10-CM | POA: Diagnosis not present

## 2015-12-10 DIAGNOSIS — I10 Essential (primary) hypertension: Secondary | ICD-10-CM | POA: Diagnosis not present

## 2015-12-10 DIAGNOSIS — E785 Hyperlipidemia, unspecified: Secondary | ICD-10-CM | POA: Diagnosis not present

## 2015-12-10 DIAGNOSIS — C4359 Malignant melanoma of other part of trunk: Secondary | ICD-10-CM | POA: Diagnosis not present

## 2015-12-11 DIAGNOSIS — Z483 Aftercare following surgery for neoplasm: Secondary | ICD-10-CM | POA: Diagnosis not present

## 2015-12-21 DIAGNOSIS — E119 Type 2 diabetes mellitus without complications: Secondary | ICD-10-CM | POA: Diagnosis not present

## 2015-12-21 DIAGNOSIS — Z483 Aftercare following surgery for neoplasm: Secondary | ICD-10-CM | POA: Diagnosis not present

## 2015-12-21 DIAGNOSIS — E785 Hyperlipidemia, unspecified: Secondary | ICD-10-CM | POA: Diagnosis not present

## 2015-12-21 DIAGNOSIS — C4362 Malignant melanoma of left upper limb, including shoulder: Secondary | ICD-10-CM | POA: Diagnosis not present

## 2015-12-21 DIAGNOSIS — C4359 Malignant melanoma of other part of trunk: Secondary | ICD-10-CM | POA: Diagnosis not present

## 2015-12-21 DIAGNOSIS — I10 Essential (primary) hypertension: Secondary | ICD-10-CM | POA: Diagnosis not present

## 2015-12-21 DIAGNOSIS — C4442 Squamous cell carcinoma of skin of scalp and neck: Secondary | ICD-10-CM | POA: Diagnosis not present

## 2015-12-21 DIAGNOSIS — C44320 Squamous cell carcinoma of skin of unspecified parts of face: Secondary | ICD-10-CM | POA: Diagnosis not present

## 2015-12-24 DIAGNOSIS — Z483 Aftercare following surgery for neoplasm: Secondary | ICD-10-CM | POA: Diagnosis not present

## 2015-12-28 ENCOUNTER — Telehealth: Payer: Self-pay | Admitting: Family Medicine

## 2015-12-28 DIAGNOSIS — C4362 Malignant melanoma of left upper limb, including shoulder: Secondary | ICD-10-CM | POA: Diagnosis not present

## 2015-12-28 DIAGNOSIS — C44320 Squamous cell carcinoma of skin of unspecified parts of face: Secondary | ICD-10-CM | POA: Diagnosis not present

## 2015-12-28 DIAGNOSIS — E785 Hyperlipidemia, unspecified: Secondary | ICD-10-CM | POA: Diagnosis not present

## 2015-12-28 DIAGNOSIS — C4359 Malignant melanoma of other part of trunk: Secondary | ICD-10-CM | POA: Diagnosis not present

## 2015-12-28 DIAGNOSIS — E119 Type 2 diabetes mellitus without complications: Secondary | ICD-10-CM | POA: Diagnosis not present

## 2015-12-28 DIAGNOSIS — C4442 Squamous cell carcinoma of skin of scalp and neck: Secondary | ICD-10-CM | POA: Diagnosis not present

## 2015-12-28 DIAGNOSIS — Z483 Aftercare following surgery for neoplasm: Secondary | ICD-10-CM | POA: Diagnosis not present

## 2015-12-28 DIAGNOSIS — I10 Essential (primary) hypertension: Secondary | ICD-10-CM | POA: Diagnosis not present

## 2015-12-28 NOTE — Telephone Encounter (Signed)
Ria Comment with [facility] stated that pt is currently using a unna boot. All of pt's wounds have healed and she would like verbal orders to switch to compression stockings. Ria Comment would also like to get verbal orders to recert pt for another 60 days of home healthcare. Please advise. Thanks TNP

## 2015-12-31 NOTE — Telephone Encounter (Signed)
That's fine

## 2015-12-31 NOTE — Telephone Encounter (Signed)
Mrs. Ria Comment advised as below. sd

## 2016-01-04 DIAGNOSIS — Z483 Aftercare following surgery for neoplasm: Secondary | ICD-10-CM | POA: Diagnosis not present

## 2016-01-04 DIAGNOSIS — C4359 Malignant melanoma of other part of trunk: Secondary | ICD-10-CM | POA: Diagnosis not present

## 2016-01-04 DIAGNOSIS — C44320 Squamous cell carcinoma of skin of unspecified parts of face: Secondary | ICD-10-CM | POA: Diagnosis not present

## 2016-01-04 DIAGNOSIS — E785 Hyperlipidemia, unspecified: Secondary | ICD-10-CM | POA: Diagnosis not present

## 2016-01-04 DIAGNOSIS — E119 Type 2 diabetes mellitus without complications: Secondary | ICD-10-CM | POA: Diagnosis not present

## 2016-01-04 DIAGNOSIS — C4442 Squamous cell carcinoma of skin of scalp and neck: Secondary | ICD-10-CM | POA: Diagnosis not present

## 2016-01-04 DIAGNOSIS — I10 Essential (primary) hypertension: Secondary | ICD-10-CM | POA: Diagnosis not present

## 2016-01-04 DIAGNOSIS — C4362 Malignant melanoma of left upper limb, including shoulder: Secondary | ICD-10-CM | POA: Diagnosis not present

## 2016-01-08 ENCOUNTER — Encounter: Payer: Self-pay | Admitting: Family Medicine

## 2016-01-08 DIAGNOSIS — Z483 Aftercare following surgery for neoplasm: Secondary | ICD-10-CM | POA: Diagnosis not present

## 2016-01-08 DIAGNOSIS — C4362 Malignant melanoma of left upper limb, including shoulder: Secondary | ICD-10-CM | POA: Diagnosis not present

## 2016-01-08 DIAGNOSIS — C4442 Squamous cell carcinoma of skin of scalp and neck: Secondary | ICD-10-CM | POA: Diagnosis not present

## 2016-01-08 DIAGNOSIS — C44320 Squamous cell carcinoma of skin of unspecified parts of face: Secondary | ICD-10-CM | POA: Diagnosis not present

## 2016-01-11 DIAGNOSIS — C4359 Malignant melanoma of other part of trunk: Secondary | ICD-10-CM | POA: Diagnosis not present

## 2016-01-11 DIAGNOSIS — C4442 Squamous cell carcinoma of skin of scalp and neck: Secondary | ICD-10-CM | POA: Diagnosis not present

## 2016-01-11 DIAGNOSIS — E119 Type 2 diabetes mellitus without complications: Secondary | ICD-10-CM | POA: Diagnosis not present

## 2016-01-11 DIAGNOSIS — B351 Tinea unguium: Secondary | ICD-10-CM | POA: Diagnosis not present

## 2016-01-11 DIAGNOSIS — I10 Essential (primary) hypertension: Secondary | ICD-10-CM | POA: Diagnosis not present

## 2016-01-11 DIAGNOSIS — L03032 Cellulitis of left toe: Secondary | ICD-10-CM | POA: Diagnosis not present

## 2016-01-11 DIAGNOSIS — I739 Peripheral vascular disease, unspecified: Secondary | ICD-10-CM | POA: Diagnosis not present

## 2016-01-11 DIAGNOSIS — L851 Acquired keratosis [keratoderma] palmaris et plantaris: Secondary | ICD-10-CM | POA: Diagnosis not present

## 2016-01-11 DIAGNOSIS — C4362 Malignant melanoma of left upper limb, including shoulder: Secondary | ICD-10-CM | POA: Diagnosis not present

## 2016-01-11 DIAGNOSIS — C44320 Squamous cell carcinoma of skin of unspecified parts of face: Secondary | ICD-10-CM | POA: Diagnosis not present

## 2016-01-11 DIAGNOSIS — E785 Hyperlipidemia, unspecified: Secondary | ICD-10-CM | POA: Diagnosis not present

## 2016-01-11 DIAGNOSIS — Z483 Aftercare following surgery for neoplasm: Secondary | ICD-10-CM | POA: Diagnosis not present

## 2016-01-16 ENCOUNTER — Ambulatory Visit: Payer: Medicare Other | Admitting: Family Medicine

## 2016-01-16 DIAGNOSIS — I10 Essential (primary) hypertension: Secondary | ICD-10-CM | POA: Diagnosis not present

## 2016-01-16 DIAGNOSIS — C44320 Squamous cell carcinoma of skin of unspecified parts of face: Secondary | ICD-10-CM | POA: Diagnosis not present

## 2016-01-16 DIAGNOSIS — E785 Hyperlipidemia, unspecified: Secondary | ICD-10-CM | POA: Diagnosis not present

## 2016-01-16 DIAGNOSIS — C4359 Malignant melanoma of other part of trunk: Secondary | ICD-10-CM | POA: Diagnosis not present

## 2016-01-16 DIAGNOSIS — E119 Type 2 diabetes mellitus without complications: Secondary | ICD-10-CM | POA: Diagnosis not present

## 2016-01-16 DIAGNOSIS — C4442 Squamous cell carcinoma of skin of scalp and neck: Secondary | ICD-10-CM | POA: Diagnosis not present

## 2016-01-16 DIAGNOSIS — Z483 Aftercare following surgery for neoplasm: Secondary | ICD-10-CM | POA: Diagnosis not present

## 2016-01-16 DIAGNOSIS — C4362 Malignant melanoma of left upper limb, including shoulder: Secondary | ICD-10-CM | POA: Diagnosis not present

## 2016-01-17 DIAGNOSIS — Z483 Aftercare following surgery for neoplasm: Secondary | ICD-10-CM | POA: Diagnosis not present

## 2016-01-23 ENCOUNTER — Telehealth: Payer: Self-pay | Admitting: Family Medicine

## 2016-01-23 DIAGNOSIS — C4362 Malignant melanoma of left upper limb, including shoulder: Secondary | ICD-10-CM | POA: Diagnosis not present

## 2016-01-23 DIAGNOSIS — E785 Hyperlipidemia, unspecified: Secondary | ICD-10-CM | POA: Diagnosis not present

## 2016-01-23 DIAGNOSIS — C4359 Malignant melanoma of other part of trunk: Secondary | ICD-10-CM | POA: Diagnosis not present

## 2016-01-23 DIAGNOSIS — C44320 Squamous cell carcinoma of skin of unspecified parts of face: Secondary | ICD-10-CM | POA: Diagnosis not present

## 2016-01-23 DIAGNOSIS — I10 Essential (primary) hypertension: Secondary | ICD-10-CM | POA: Diagnosis not present

## 2016-01-23 DIAGNOSIS — E119 Type 2 diabetes mellitus without complications: Secondary | ICD-10-CM | POA: Diagnosis not present

## 2016-01-23 DIAGNOSIS — Z483 Aftercare following surgery for neoplasm: Secondary | ICD-10-CM | POA: Diagnosis not present

## 2016-01-23 DIAGNOSIS — C4442 Squamous cell carcinoma of skin of scalp and neck: Secondary | ICD-10-CM | POA: Diagnosis not present

## 2016-01-23 NOTE — Telephone Encounter (Signed)
Ria Comment with [facility] is requesting pt last ABI reading and the name of the vascular doctor pt has seen in the past.  CB#[phone]/MW

## 2016-01-24 NOTE — Telephone Encounter (Signed)
Returned call to [facility] and informed her that pt has seen Dr. Delana Meyer in the past. No record of ABI.

## 2016-01-29 DIAGNOSIS — Z483 Aftercare following surgery for neoplasm: Secondary | ICD-10-CM | POA: Diagnosis not present

## 2016-01-30 DIAGNOSIS — E119 Type 2 diabetes mellitus without complications: Secondary | ICD-10-CM | POA: Diagnosis not present

## 2016-01-30 DIAGNOSIS — C4359 Malignant melanoma of other part of trunk: Secondary | ICD-10-CM | POA: Diagnosis not present

## 2016-01-30 DIAGNOSIS — I10 Essential (primary) hypertension: Secondary | ICD-10-CM | POA: Diagnosis not present

## 2016-01-30 DIAGNOSIS — C44320 Squamous cell carcinoma of skin of unspecified parts of face: Secondary | ICD-10-CM | POA: Diagnosis not present

## 2016-01-30 DIAGNOSIS — C4442 Squamous cell carcinoma of skin of scalp and neck: Secondary | ICD-10-CM | POA: Diagnosis not present

## 2016-01-30 DIAGNOSIS — C4362 Malignant melanoma of left upper limb, including shoulder: Secondary | ICD-10-CM | POA: Diagnosis not present

## 2016-01-30 DIAGNOSIS — E785 Hyperlipidemia, unspecified: Secondary | ICD-10-CM | POA: Diagnosis not present

## 2016-01-30 DIAGNOSIS — Z483 Aftercare following surgery for neoplasm: Secondary | ICD-10-CM | POA: Diagnosis not present

## 2016-02-07 ENCOUNTER — Telehealth: Payer: Self-pay

## 2016-02-07 NOTE — Telephone Encounter (Signed)
[facility] Comment with [facility] is calling to request 2 more weeks of in home visits for [facility] care. Please advise. CB# [phone]. Renaldo Fiddler, CMA

## 2016-02-07 NOTE — Telephone Encounter (Signed)
OK 

## 2016-02-08 DIAGNOSIS — C4362 Malignant melanoma of left upper limb, including shoulder: Secondary | ICD-10-CM | POA: Diagnosis not present

## 2016-02-08 DIAGNOSIS — C44320 Squamous cell carcinoma of skin of unspecified parts of face: Secondary | ICD-10-CM | POA: Diagnosis not present

## 2016-02-08 DIAGNOSIS — C4442 Squamous cell carcinoma of skin of scalp and neck: Secondary | ICD-10-CM | POA: Diagnosis not present

## 2016-02-08 DIAGNOSIS — E119 Type 2 diabetes mellitus without complications: Secondary | ICD-10-CM | POA: Diagnosis not present

## 2016-02-08 DIAGNOSIS — I10 Essential (primary) hypertension: Secondary | ICD-10-CM | POA: Diagnosis not present

## 2016-02-08 DIAGNOSIS — E785 Hyperlipidemia, unspecified: Secondary | ICD-10-CM | POA: Diagnosis not present

## 2016-02-08 DIAGNOSIS — C4359 Malignant melanoma of other part of trunk: Secondary | ICD-10-CM | POA: Diagnosis not present

## 2016-02-08 DIAGNOSIS — Z483 Aftercare following surgery for neoplasm: Secondary | ICD-10-CM | POA: Diagnosis not present

## 2016-02-08 NOTE — Telephone Encounter (Signed)
Left detailed message on Miguel Torres's vm. 

## 2016-02-11 ENCOUNTER — Telehealth: Payer: Self-pay | Admitting: Family Medicine

## 2016-02-11 DIAGNOSIS — I10 Essential (primary) hypertension: Secondary | ICD-10-CM | POA: Diagnosis not present

## 2016-02-11 DIAGNOSIS — E785 Hyperlipidemia, unspecified: Secondary | ICD-10-CM | POA: Diagnosis not present

## 2016-02-11 DIAGNOSIS — E119 Type 2 diabetes mellitus without complications: Secondary | ICD-10-CM | POA: Diagnosis not present

## 2016-02-11 DIAGNOSIS — C4359 Malignant melanoma of other part of trunk: Secondary | ICD-10-CM | POA: Diagnosis not present

## 2016-02-11 DIAGNOSIS — C44320 Squamous cell carcinoma of skin of unspecified parts of face: Secondary | ICD-10-CM | POA: Diagnosis not present

## 2016-02-11 DIAGNOSIS — C4442 Squamous cell carcinoma of skin of scalp and neck: Secondary | ICD-10-CM | POA: Diagnosis not present

## 2016-02-11 DIAGNOSIS — C4362 Malignant melanoma of left upper limb, including shoulder: Secondary | ICD-10-CM | POA: Diagnosis not present

## 2016-02-11 DIAGNOSIS — Z483 Aftercare following surgery for neoplasm: Secondary | ICD-10-CM | POA: Diagnosis not present

## 2016-02-11 NOTE — Telephone Encounter (Signed)
Mendel Ryder with [facility] stating pt is being discharged from home care.  She is not ordering compression stocking's due to ABI results. ABI results point to arterial insufficiency   Pt will have a follow appointment with vascular.  CB#[phone]/MW

## 2016-02-11 NOTE — Telephone Encounter (Signed)
FYI. KW 

## 2016-02-19 ENCOUNTER — Ambulatory Visit (INDEPENDENT_AMBULATORY_CARE_PROVIDER_SITE_OTHER): Payer: Self-pay | Admitting: Vascular Surgery

## 2016-02-26 ENCOUNTER — Ambulatory Visit (INDEPENDENT_AMBULATORY_CARE_PROVIDER_SITE_OTHER): Payer: Medicare Other | Admitting: Family Medicine

## 2016-02-26 ENCOUNTER — Encounter: Payer: Self-pay | Admitting: Family Medicine

## 2016-02-26 VITALS — BP 112/78 | HR 70 | Temp 97.8°F | Resp 16 | Wt 221.0 lb

## 2016-02-26 DIAGNOSIS — I10 Essential (primary) hypertension: Secondary | ICD-10-CM

## 2016-02-26 DIAGNOSIS — E1121 Type 2 diabetes mellitus with diabetic nephropathy: Secondary | ICD-10-CM | POA: Diagnosis not present

## 2016-02-26 DIAGNOSIS — E785 Hyperlipidemia, unspecified: Secondary | ICD-10-CM | POA: Diagnosis not present

## 2016-02-26 DIAGNOSIS — E559 Vitamin D deficiency, unspecified: Secondary | ICD-10-CM | POA: Diagnosis not present

## 2016-02-26 LAB — POCT GLYCOSYLATED HEMOGLOBIN (HGB A1C)
Est. average glucose Bld gHb Est-mCnc: 157
Hemoglobin A1C: 7.1

## 2016-02-26 NOTE — Progress Notes (Signed)
Patient: Miguel Torres Male    DOB: Jun 24, 1930   82 y.o.   MRN: [id number] Visit Date: 02/26/2016  Today's Provider: Lelon Huh, MD   Chief Complaint  Patient presents with  . Diabetes    follow up  . Hyperlipidemia    follow up  . Hypertension    follow up   Subjective:    HPI  Diabetes Mellitus Type II, Follow-up:   Lab Results  Component Value Date   HGBA1C 7.2 11/12/2015   HGBA1C 6.9 07/11/2015   HGBA1C 7.6 (H) 01/19/2015   Last seen for diabetes 3 months ago.  Management since then includes no changes. He reports good compliance with treatment. He is not having side effects.  Current symptoms include none and have been stable. Home blood sugar records: not being checked  Episodes of hypoglycemia? no   Current Insulin Regimen: none Most Recent Eye Exam: 1 year ago Weight trend: decreasing steadily Prior visit with dietician: no Current diet: in general, a "healthy" diet   Current exercise: none  ------------------------------------------------------------------------   Hypertension, follow-up:  BP Readings from Last 3 Encounters:  11/12/15 118/64  10/10/15 138/84  07/11/15 122/60    He was last seen for hypertension 3 months ago.  BP at that visit was 118/64. Management since that visit includes no changes.He reports good compliance with treatment. He is not having side effects.  He is not exercising. He is adherent to low salt diet.   Outside blood pressures are not being checked. He is experiencing lower extremity edema.  Patient denies chest pain, chest pressure/discomfort, claudication, dyspnea, exertional chest pressure/discomfort, fatigue, irregular heart beat, near-syncope, orthopnea, palpitations, paroxysmal nocturnal dyspnea, syncope and tachypnea.   Cardiovascular risk factors include advanced age (older than 78 for men, 61 for women), diabetes mellitus, dyslipidemia, hypertension and male gender.  Use of agents associated  with hypertension: NSAIDS.   ------------------------------------------------------------------------    Lipid/Cholesterol, Follow-up:   Last seen for this 3 months ago.  Management since that visit includes no changes.  Last Lipid Panel:    Component Value Date/Time   CHOL 103 01/19/2015 1623   TRIG 121 01/19/2015 1623   HDL 43 01/19/2015 1623   CHOLHDL 2.4 01/19/2015 1623   LDLCALC 36 01/19/2015 1623    He reports good compliance with treatment. He is not having side effects.   Wt Readings from Last 3 Encounters:  11/12/15 235 lb (106.6 kg)  10/10/15 239 lb (108.4 kg)  07/11/15 241 lb (109.3 kg)    ------------------------------------------------------------------------ Follow up Vitamin D deficiency:  Patient was last seen for this problem 6 months ago. Changes made include starting Vitamin D 50,000 units weekly. Patient reports good compliance with treatment.   Lab Results  Component Value Date   VD25OH 8.6 (L) 07/11/2015       No Known Allergies   Current Outpatient Prescriptions:  .  acetaminophen (TYLENOL) 325 MG tablet, Take 650 mg by mouth. Reported on 11/13/2015, Disp: , Rfl:  .  allopurinol (ZYLOPRIM) 300 MG tablet, Take 300 mg by mouth 2 (two) times daily., Disp: , Rfl:  .  atenolol-chlorthalidone (TENORETIC) 50-25 MG tablet, Take 1 tablet by mouth daily., Disp: 90 tablet, Rfl: 3 .  felodipine (PLENDIL) 2.5 MG 24 hr tablet, Take 1 tablet (2.5 mg total) by mouth daily., Disp: 90 tablet, Rfl: 4 .  ferrous sulfate 325 (65 FE) MG tablet, Take 1 tablet by mouth daily., Disp: , Rfl:  .  fluocinonide ointment (  LIDEX) 0.05 %, FLUOCINONIDE, 0.05% (External Ointment) - Historical Medication  (0.05 %) Active, Disp: , Rfl:  .  furosemide (LASIX) 20 MG tablet, Take 1 tablet by mouth daily as needed. Reported on 11/13/2015, Disp: , Rfl:  .  glipiZIDE (GLUCOTROL) 10 MG tablet, TAKE 1 TABLET (10 MG TOTAL) BY MOUTH DAILY., Disp: 90 tablet, Rfl: 1 .   HYDROcodone-acetaminophen (NORCO/VICODIN) 5-325 MG per tablet, Take 1 tablet by mouth every 6 (six) hours as needed. Reported on 11/13/2015, Disp: , Rfl:  .  indomethacin (INDOCIN) 50 MG capsule, Take 1 capsule by mouth 3 (three) times daily. Reported on 11/13/2015, Disp: , Rfl:  .  loratadine (CLARITIN) 10 MG tablet, Take 10 mg by mouth daily., Disp: , Rfl:  .  lovastatin (MEVACOR) 40 MG tablet, TAKE 1 TABLET (40 MG TOTAL) BY MOUTH DAILY., Disp: 90 tablet, Rfl: 1 .  metFORMIN (GLUCOPHAGE-XR) 500 MG 24 hr tablet, Take 1 tablet (500 mg total) by mouth daily., Disp: 90 tablet, Rfl: 3 .  montelukast (SINGULAIR) 10 MG tablet, TAKE 1 TABLET (10 MG TOTAL) BY MOUTH EVERY EVENING., Disp: 90 tablet, Rfl: 1 .  pioglitazone (ACTOS) 30 MG tablet, TAKE 1 TABLET (30 MG TOTAL) BY MOUTH DAILY., Disp: 90 tablet, Rfl: 1 .  polyethylene glycol (MIRALAX / GLYCOLAX) packet, Take by mouth., Disp: , Rfl:  .  tamsulosin (FLOMAX) 0.4 MG CAPS capsule, Take 0.4 mg by mouth. Reported on 11/13/2015, Disp: , Rfl:  .  triamcinolone lotion (KENALOG) 0.1 %, TRIAMCINOLONE ACETONIDE, 0.1% (External Lotion)  1 application daily for 0 days  Quantity: 120;  Refills: 3   Ordered :26-November-2010  [name] ;  Started 26-November-2010 Active, Disp: , Rfl:  .  Vitamin D, Ergocalciferol, (DRISDOL) 50000 units CAPS capsule, Take 1 capsule (50,000 Units total) by mouth every 7 (seven) days., Disp: 12 capsule, Rfl: 3  Review of Systems  Constitutional: Negative for appetite change, chills and fever.  Respiratory: Negative for chest tightness, shortness of breath and wheezing.   Cardiovascular: Positive for leg swelling. Negative for chest pain and palpitations.  Gastrointestinal: Negative for abdominal pain, nausea and vomiting.  Endocrine: Negative for cold intolerance, heat intolerance, polydipsia, polyphagia and polyuria.    Social History  Substance Use Topics  . Smoking status: Never Smoker  . Smokeless tobacco: Never Used  . Alcohol  use No   Objective:   BP 112/78 (BP Location: Right Arm, Patient Position: Sitting, Cuff Size: Large)   Pulse 70   Temp 97.8 F (36.6 C) (Oral)   Resp 16   Wt 221 lb (100.2 kg)   SpO2 97% Comment: room air  BMI 33.60 kg/m   Physical Exam  General Appearance:    Alert, cooperative, no distress, obese  Eyes:    PERRL, conjunctiva/corneas clear, EOM's intact       Lungs:     Clear to auscultation bilaterally, respirations unlabored  Heart:    Regular rate and rhythm  Neurologic:   Awake, alert, oriented x 3. No apparent focal neurological           defect.        Results for orders placed or performed in visit on 02/26/16  POCT HgB A1C  Result Value Ref Range   Hemoglobin A1C 7.1    Est. average glucose Bld gHb Est-mCnc 157        Assessment & Plan:     1. Diabetes mellitus with nephropathy (HCC)  - POCT HgB A1C  2. Essential hypertension Well  controlled.  Continue current medications.    3. Hyperlipidemia, unspecified hyperlipidemia type He is tolerating lovastatin well with no adverse effects.   - Lipid panel - Hepatic function panel  4. Vitamin D deficiency  - VITAMIN D 25 Hydroxy (Vit-D Deficiency, Fractures)     He had flu shot at his pharmacy.   The entirety of the information documented in the History of Present Illness, Review of Systems and Physical Exam were personally obtained by me. Portions of this information were initially documented by Meyer Cory, CMA and reviewed by me for thoroughness and accuracy.       Lelon Huh, MD  [facility]

## 2016-02-29 DIAGNOSIS — E785 Hyperlipidemia, unspecified: Secondary | ICD-10-CM | POA: Diagnosis not present

## 2016-02-29 DIAGNOSIS — E559 Vitamin D deficiency, unspecified: Secondary | ICD-10-CM | POA: Diagnosis not present

## 2016-03-01 LAB — HEPATIC FUNCTION PANEL
ALT: 15 IU/L (ref 0–44)
AST: 23 IU/L (ref 0–40)
Albumin: 3.7 g/dL (ref 3.5–4.7)
Alkaline Phosphatase: 106 IU/L (ref 39–117)
BILIRUBIN TOTAL: 0.4 mg/dL (ref 0.0–1.2)
Bilirubin, Direct: 0.14 mg/dL (ref 0.00–0.40)
Total Protein: 6.5 g/dL (ref 6.0–8.5)

## 2016-03-01 LAB — VITAMIN D 25 HYDROXY (VIT D DEFICIENCY, FRACTURES): Vit D, 25-Hydroxy: 29.7 ng/mL — ABNORMAL LOW (ref 30.0–100.0)

## 2016-03-01 LAB — LIPID PANEL
CHOL/HDL RATIO: 2.5 ratio (ref 0.0–5.0)
CHOLESTEROL TOTAL: 111 mg/dL (ref 100–199)
HDL: 44 mg/dL (ref >39)
LDL CALC: 52 mg/dL (ref 0–99)
TRIGLYCERIDES: 74 mg/dL (ref 0–149)
VLDL Cholesterol Cal: 15 mg/dL (ref 5–40)

## 2016-04-14 ENCOUNTER — Other Ambulatory Visit: Payer: Self-pay | Admitting: Family Medicine

## 2016-04-19 ENCOUNTER — Other Ambulatory Visit: Payer: Self-pay | Admitting: Family Medicine

## 2016-04-21 ENCOUNTER — Other Ambulatory Visit: Payer: Self-pay | Admitting: Family Medicine

## 2016-04-23 DIAGNOSIS — C44311 Basal cell carcinoma of skin of nose: Secondary | ICD-10-CM | POA: Diagnosis not present

## 2016-04-23 DIAGNOSIS — D485 Neoplasm of uncertain behavior of skin: Secondary | ICD-10-CM | POA: Diagnosis not present

## 2016-04-23 DIAGNOSIS — C4442 Squamous cell carcinoma of skin of scalp and neck: Secondary | ICD-10-CM | POA: Diagnosis not present

## 2016-04-23 DIAGNOSIS — D044 Carcinoma in situ of skin of scalp and neck: Secondary | ICD-10-CM | POA: Diagnosis not present

## 2016-04-23 DIAGNOSIS — C44619 Basal cell carcinoma of skin of left upper limb, including shoulder: Secondary | ICD-10-CM | POA: Diagnosis not present

## 2016-05-22 ENCOUNTER — Other Ambulatory Visit: Payer: Self-pay | Admitting: Family Medicine

## 2016-05-22 DIAGNOSIS — I10 Essential (primary) hypertension: Secondary | ICD-10-CM

## 2016-06-06 ENCOUNTER — Telehealth: Payer: Self-pay | Admitting: Family Medicine

## 2016-06-06 NOTE — Telephone Encounter (Signed)
Spoke to Pt to schedule AWV with [facility] daughter will c/b to schedule- knb

## 2016-06-24 DIAGNOSIS — D492 Neoplasm of unspecified behavior of bone, soft tissue, and skin: Secondary | ICD-10-CM | POA: Diagnosis not present

## 2016-06-24 DIAGNOSIS — C44311 Basal cell carcinoma of skin of nose: Secondary | ICD-10-CM | POA: Diagnosis not present

## 2016-06-24 DIAGNOSIS — C44310 Basal cell carcinoma of skin of unspecified parts of face: Secondary | ICD-10-CM | POA: Insufficient documentation

## 2016-06-24 DIAGNOSIS — C4442 Squamous cell carcinoma of skin of scalp and neck: Secondary | ICD-10-CM | POA: Diagnosis not present

## 2016-07-13 ENCOUNTER — Other Ambulatory Visit: Payer: Self-pay | Admitting: Family Medicine

## 2016-07-13 DIAGNOSIS — E559 Vitamin D deficiency, unspecified: Secondary | ICD-10-CM

## 2016-07-14 ENCOUNTER — Other Ambulatory Visit: Payer: Self-pay | Admitting: Family Medicine

## 2016-09-09 ENCOUNTER — Other Ambulatory Visit: Payer: Self-pay | Admitting: Family Medicine

## 2016-09-09 NOTE — Telephone Encounter (Signed)
Patient is overdue for diabetes follow up. Needs to schedule before we can order refills.

## 2016-09-10 NOTE — Telephone Encounter (Signed)
I called patient and advised him as below. Patient states he would have his daughter, [name], call us to schedule an appointment; she is the one who now makes his appointments and takes him to his office visits, since his other daughter Shirlean Mylar passed away. If [name] doesn't call back, we will need to call patient back to follow up on this message. Patient didn't have Susan's phone number at the time I called.

## 2016-09-16 NOTE — Telephone Encounter (Signed)
Patient scheduled for 10/15/16

## 2016-10-10 ENCOUNTER — Other Ambulatory Visit: Payer: Self-pay | Admitting: Family Medicine

## 2016-10-15 ENCOUNTER — Encounter: Payer: Self-pay | Admitting: Family Medicine

## 2016-10-15 ENCOUNTER — Ambulatory Visit (INDEPENDENT_AMBULATORY_CARE_PROVIDER_SITE_OTHER): Payer: Medicare Other | Admitting: Family Medicine

## 2016-10-15 VITALS — BP 122/72 | HR 63 | Temp 98.5°F | Resp 18 | Ht 68.0 in | Wt 229.0 lb

## 2016-10-15 DIAGNOSIS — E1121 Type 2 diabetes mellitus with diabetic nephropathy: Secondary | ICD-10-CM | POA: Diagnosis not present

## 2016-10-15 DIAGNOSIS — N183 Chronic kidney disease, stage 3 unspecified: Secondary | ICD-10-CM

## 2016-10-15 DIAGNOSIS — S88111A Complete traumatic amputation at level between knee and ankle, right lower leg, initial encounter: Secondary | ICD-10-CM | POA: Diagnosis not present

## 2016-10-15 DIAGNOSIS — I10 Essential (primary) hypertension: Secondary | ICD-10-CM | POA: Diagnosis not present

## 2016-10-15 DIAGNOSIS — R809 Proteinuria, unspecified: Secondary | ICD-10-CM | POA: Insufficient documentation

## 2016-10-15 DIAGNOSIS — Z0000 Encounter for general adult medical examination without abnormal findings: Secondary | ICD-10-CM

## 2016-10-15 DIAGNOSIS — D638 Anemia in other chronic diseases classified elsewhere: Secondary | ICD-10-CM

## 2016-10-15 DIAGNOSIS — E785 Hyperlipidemia, unspecified: Secondary | ICD-10-CM

## 2016-10-15 DIAGNOSIS — E559 Vitamin D deficiency, unspecified: Secondary | ICD-10-CM

## 2016-10-15 LAB — POCT UA - MICROALBUMIN: Microalbumin Ur, POC: 50 mg/L

## 2016-10-15 NOTE — Progress Notes (Signed)
Patient: Miguel Torres, Male    DOB: 11-Feb-1931, 83 y.o.   MRN: [id number] Visit Date: 10/15/2016  Today's Provider: Lelon Huh, MD   Chief Complaint  Patient presents with  . Medicare Wellness  . Hyperlipidemia    follow up  . Diabetes    follow up  . Hypertension    follow up   Subjective:    Annual wellness visit Miguel Torres is a 83 y.o. male. He feels fairly well. He reports exercising by walking around the house. He reports he is sleeping fairly well.  -----------------------------------------------------------  Hypertension, follow-up:  BP Readings from Last 3 Encounters:  02/26/16 112/78  11/12/15 118/64  10/10/15 138/84    He was last seen for hypertension 8 months ago.  BP at that visit was 112/78. Management since that visit includes no changes. He reports good compliance with treatment. He is not having side effects.  He is exercising. He is not adherent to low salt diet.   Outside blood pressures are not checked. He is experiencing none.  Patient denies chest pain, chest pressure/discomfort, claudication, dyspnea, exertional chest pressure/discomfort, fatigue, irregular heart beat, lower extremity edema, near-syncope, orthopnea, palpitations, paroxysmal nocturnal dyspnea, syncope and tachypnea.   Cardiovascular risk factors include advanced age (older than 32 for men, 26 for women), diabetes mellitus, dyslipidemia, hypertension and male gender.  Use of agents associated with hypertension: none.     Weight trend: fluctuating a bit Wt Readings from Last 3 Encounters:  02/26/16 221 lb (100.2 kg)  11/12/15 235 lb (106.6 kg)  10/10/15 239 lb (108.4 kg)    Current diet: well balanced  ------------------------------------------------------------------------  Diabetes Mellitus Type II, Follow-up:   Lab Results  Component Value Date   HGBA1C 7.1 02/26/2016   HGBA1C 7.2 11/12/2015   HGBA1C 6.9 07/11/2015    Last seen for  diabetes 8 months ago.  Management since then includes no changes. He reports good compliance with treatment. He is not having side effects.  Current symptoms include none and have been stable. Home blood sugar records: blood sugars not being checked  Episodes of hypoglycemia? no   Current Insulin Regimen: none Most Recent Eye Exam: 1 year ago Weight trend: fluctuating a bit Prior visit with dietician: no Current diet: well balanced Current exercise: walking  Pertinent Labs:    Component Value Date/Time   CHOL 111 02/29/2016 0849   TRIG 74 02/29/2016 0849   HDL 44 02/29/2016 0849   LDLCALC 52 02/29/2016 0849   CREATININE 1.55 (H) 07/11/2015 1652   CREATININE 1.33 (H) 09/30/2012 1609    Wt Readings from Last 3 Encounters:  02/26/16 221 lb (100.2 kg)  11/12/15 235 lb (106.6 kg)  10/10/15 239 lb (108.4 kg)    ------------------------------------------------------------------------  Lipid/Cholesterol, Follow-up:   Last seen for this 8 months ago.  Management changes since that visit include none. . Last Lipid Panel:    Component Value Date/Time   CHOL 111 02/29/2016 0849   TRIG 74 02/29/2016 0849   HDL 44 02/29/2016 0849   CHOLHDL 2.5 02/29/2016 0849   LDLCALC 52 02/29/2016 0849    Risk factors for vascular disease include diabetes mellitus, hypercholesterolemia and hypertension  He reports good compliance with treatment. He is not having side effects.  Current symptoms include none and have been stable. Weight trend: fluctuating a bit Prior visit with dietician: no Current diet: well balanced Current exercise: walking  Wt Readings from Last 3 Encounters:  02/26/16 221 lb (  100.2 kg)  11/12/15 235 lb (106.6 kg)  10/10/15 239 lb (108.4 kg)    ------------------------------------------------------------------- Follow up of Vitamin D Deficiency: Patient was last seen for this problem 8 months ago and no changes were made.  Lab Results  Component Value  Date   VD25OH 29.7 (L) 02/29/2016    Review of Systems  Constitutional: Negative for appetite change, chills, fatigue and fever.  HENT: Negative for congestion, ear pain, hearing loss, nosebleeds and trouble swallowing.   Eyes: Negative for pain and visual disturbance.  Respiratory: Negative for cough, chest tightness and shortness of breath.   Cardiovascular: Negative for chest pain, palpitations and leg swelling.  Gastrointestinal: Negative for abdominal pain, blood in stool, constipation, diarrhea, nausea and vomiting.  Endocrine: Negative for polydipsia, polyphagia and polyuria.  Genitourinary: Negative for dysuria and flank pain.  Musculoskeletal: Negative for arthralgias, back pain, joint swelling, myalgias and neck stiffness.  Skin: Negative for color change, rash and wound.  Neurological: Negative for dizziness, tremors, seizures, speech difficulty, weakness, light-headedness and headaches.  Psychiatric/Behavioral: Negative for behavioral problems, confusion, decreased concentration, dysphoric mood and sleep disturbance. The patient is not nervous/anxious.   All other systems reviewed and are negative.   Social History   Social History  . Marital status: Married    Spouse name: N/A  . Number of children: 5  . Years of education: 5th grade   Occupational History  . Retired    Social History Main Topics  . Smoking status: Never Smoker  . Smokeless tobacco: Never Used  . Alcohol use No  . Drug use: No  . Sexual activity: Not on file   Other Topics Concern  . Not on file   Social History Narrative  . No narrative on file    Past Medical History:  Diagnosis Date  . Anemia   . Hyperlipidemia   . Hypertension      Patient Active Problem List   Diagnosis Date Noted  . Vitamin D deficiency 07/12/2015  . Retinopathy, diabetic, proliferative ([place]) 07/02/2015  . Bleeding duodenal ulcer 02/02/2015  . Atrial flutter ([place]) 01/19/2015  . Allergic rhinitis 01/17/2015    . Anemia of chronic disease 01/17/2015  . Chronic kidney disease (CKD), stage III (moderate) 01/17/2015  . Diabetes mellitus with nephropathy ([place]) 01/17/2015  . Edema 01/17/2015  . Gout 01/17/2015  . Amputation of right lower extremity below knee upon examination ([place]) 01/17/2015  . History of other malignant neoplasm of skin 01/17/2015  . HLD (hyperlipidemia) 01/17/2015  . Psoriasis 01/17/2015  . CA of skin 01/17/2015  . Hypertension 10/27/2014  . Mild cognitive disorder 12/05/2013  . Encounter for surgical follow-up care 02/10/2013  . Cancer of skin, squamous cell 01/21/2013  . Squamous cell carcinoma of scalp and skin of neck 03/03/2011    Past Surgical History:  Procedure Laterality Date  . BASAL CELL CARCINOMA EXCISION    . History of eye surgery Bilateral   . MOHS SURGERY  04/2011   the top of his head  . Right BKA  09/12/2010   Dr. Delana Meyer; Secondary gangrenous right LE  . TONSILLECTOMY      His family history includes Arthritis in his sister; Diabetes in his mother.      Current Outpatient Prescriptions:  .  acetaminophen (TYLENOL) 325 MG tablet, Take 650 mg by mouth. Reported on 11/13/2015, Disp: , Rfl:  .  allopurinol (ZYLOPRIM) 300 MG tablet, Take 300 mg by mouth 2 (two) times daily., Disp: , Rfl:  .  atenolol-chlorthalidone (TENORETIC) 50-25 MG tablet, TAKE 1 TABLET BY MOUTH DAILY., Disp: 90 tablet, Rfl: 4 .  felodipine (PLENDIL) 2.5 MG 24 hr tablet, TAKE 1 TABLET BY MOUTH ONCE A DAY, Disp: 90 tablet, Rfl: 4 .  ferrous sulfate 325 (65 FE) MG tablet, Take 1 tablet by mouth daily., Disp: , Rfl:  .  fluocinonide ointment (LIDEX) 0.05 %, FLUOCINONIDE, 0.05% (External Ointment) - Historical Medication  (0.05 %) Active, Disp: , Rfl:  .  furosemide (LASIX) 20 MG tablet, Take 1 tablet by mouth daily as needed. Reported on 11/13/2015, Disp: , Rfl:  .  glipiZIDE (GLUCOTROL) 10 MG tablet, TAKE 1 TABLET (10 MG TOTAL) BY MOUTH DAILY., Disp: 90 tablet, Rfl: 3 .  indomethacin  (INDOCIN) 50 MG capsule, Take 1 capsule by mouth 3 (three) times daily. Reported on 11/13/2015, Disp: , Rfl:  .  loratadine (CLARITIN) 10 MG tablet, Take 10 mg by mouth daily., Disp: , Rfl:  .  lovastatin (MEVACOR) 40 MG tablet, TAKE 1 TABLET (40 MG TOTAL) BY MOUTH DAILY., Disp: 90 tablet, Rfl: 4 .  metFORMIN (GLUCOPHAGE-XR) 500 MG 24 hr tablet, TAKE 1 TABLET BY MOUTH EVERY DAY, Disp: 90 tablet, Rfl: 4 .  montelukast (SINGULAIR) 10 MG tablet, TAKE 1 TABLET (10 MG TOTAL) BY MOUTH EVERY EVENING., Disp: 90 tablet, Rfl: 4 .  pioglitazone (ACTOS) 30 MG tablet, TAKE 1 TABLET (30 MG TOTAL) BY MOUTH DAILY., Disp: 90 tablet, Rfl: 4 .  polyethylene glycol (MIRALAX / GLYCOLAX) packet, Take by mouth., Disp: , Rfl:  .  tamsulosin (FLOMAX) 0.4 MG CAPS capsule, Take 0.4 mg by mouth. Reported on 11/13/2015, Disp: , Rfl:  .  triamcinolone lotion (KENALOG) 0.1 %, TRIAMCINOLONE ACETONIDE, 0.1% (External Lotion)  1 application daily for 0 days  Quantity: 120;  Refills: 3   Ordered :26-November-2010  [name] ;  Started 26-November-2010 Active, Disp: , Rfl:  .  Vitamin D, Ergocalciferol, (DRISDOL) 50000 units CAPS capsule, TAKE ONE CAPSULE BY MOUTH EVERY 7 DAYS, Disp: 12 capsule, Rfl: 4  Patient Care Team: [name], MD as PCP - General (Family Medicine) Nelda Bucks, MD (Dermatology) Laural Roes, MD (Ophthalmology)     Objective:   Vitals: BP 122/72 (BP Location: Left Arm, Patient Position: Sitting, Cuff Size: Large)   Pulse 63   Temp 98.5 F (36.9 C) (Oral)   Resp 18   Ht 5\' 8"  (1.727 m)   Wt 229 lb (103.9 kg)   SpO2 98% Comment: room air  BMI 34.82 kg/m   Physical Exam   General Appearance:    Alert, cooperative, no distress  Eyes:    PERRL, conjunctiva/corneas clear, EOM's intact       Lungs:     Clear to auscultation bilaterally, respirations unlabored  Heart:    Regular rate and rhythm  Neurologic:   Awake, alert, oriented x 3. No apparent focal neurological           defect.          Activities of Daily Living In your present state of health, do you have any difficulty performing the following activities: 10/15/2016  Hearing? Y  Vision? Y  Difficulty concentrating or making decisions? N  Walking or climbing stairs? N  Dressing or bathing? N  Doing errands, shopping? N  Some recent data might be hidden    Fall Risk Assessment Fall Risk  10/15/2016 02/26/2016 01/19/2015  Falls in the past year? No No No     Depression Screen PHQ 2/9  Scores 10/15/2016 02/26/2016 01/19/2015  PHQ - 2 Score 0 0 0  PHQ- 9 Score 0 - 2    Cognitive Testing - 6-CIT  Correct? Score   What year is it? yes 0 0 or 4  What month is it? yes 0 0 or 3  Memorize:    Pia Mau,  44,  [place],      What time is it? (within 1 hour) yes 0 0 or 3  Count backwards from 20 no 0 0, 2, or 4  Name the months of the year no 4 0, 2, or 4  Repeat name & address above no 8 0, 2, 4, 6, 8, or 10       TOTAL SCORE  12/28   Interpretation:  Abnormal- .  Normal (0-7) Abnormal (8-28)    Audit-C Alcohol Use Screening  Question Answer Points  How often do you have alcoholic drink? never 0  On days you do drink alcohol, how many drinks do you typically consume? n/a 0  How oftey will you drink 6 or more in a total? never 0  Total Score:  0   A score of 3 or more in women, and 4 or more in men indicates increased risk for alcohol abuse, EXCEPT if all of the points are from question 1.  Current Exercise Habits: Home exercise routine, Type of exercise: walking, Time (Minutes): 10, Frequency (Times/Week): 7, Weekly Exercise (Minutes/Week): 70, Intensity: Mild Exercise limited by: Other - see comments (difficulty with gait)    Assessment & Plan:     Annual Wellness Visit  Reviewed patient's Family Medical History Reviewed and updated list of patient's medical providers Assessment of cognitive impairment was done Assessed patient's functional ability Established a written schedule for  health screening [place] Completed and Reviewed  Exercise Activities and Dietary recommendations Goals    None      Immunization History  Administered Date(s) Administered  . Influenza, High Dose Seasonal PF 01/19/2015  . Pneumococcal Conjugate-13 12/05/2013  . Pneumococcal Polysaccharide-23 02/22/2007    Health Maintenance  Topic Date Due  . FOOT EXAM  09/04/1942  . URINE MICROALBUMIN  09/04/1942  . TETANUS/TDAP  09/04/1951  . OPHTHALMOLOGY EXAM  06/28/2016  . HEMOGLOBIN A1C  08/26/2016  . INFLUENZA VACCINE  12/03/2016  . PNA vac Low Risk Adult  Completed     Discussed health benefits of physical activity, and encouraged him to engage in regular exercise appropriate for his age and condition.    ------------------------------------------------------------------------------------------------------------  1. Medicare annual wellness visit, subsequent Doing well.   2. Diabetes mellitus with nephropathy (HCC)  - Comprehensive metabolic panel - Hemoglobin A1c - EKG 12-Lead - POCT UA - Microalbumin  3. Essential hypertension Well controlled.  Continue current medications.   - Comprehensive metabolic panel - EKG 31-DVVO  4. Hyperlipidemia, unspecified hyperlipidemia type He is tolerating lovastatin well with no adverse effects.   - Comprehensive metabolic panel  5. Vitamin D deficiency   6. Chronic kidney disease (CKD), stage III (moderate)   7. Amputation of right lower extremity below knee upon examination [facility]) Doing well with prosthesis.   8. Anemia of chronic disease  - CBC    Lelon Huh, MD  [facility]

## 2016-10-16 LAB — COMPREHENSIVE METABOLIC PANEL
A/G RATIO: 1.1 — AB (ref 1.2–2.2)
ALK PHOS: 120 IU/L — AB (ref 39–117)
ALT: 14 IU/L (ref 0–44)
AST: 29 IU/L (ref 0–40)
Albumin: 3.5 g/dL (ref 3.5–4.7)
BILIRUBIN TOTAL: 0.4 mg/dL (ref 0.0–1.2)
BUN/Creatinine Ratio: 21 (ref 10–24)
BUN: 30 mg/dL — ABNORMAL HIGH (ref 8–27)
CHLORIDE: 101 mmol/L (ref 96–106)
CO2: 22 mmol/L (ref 20–29)
Calcium: 8.9 mg/dL (ref 8.6–10.2)
Creatinine, Ser: 1.42 mg/dL — ABNORMAL HIGH (ref 0.76–1.27)
GFR calc Af Amer: 52 mL/min/{1.73_m2} — ABNORMAL LOW (ref >59)
GFR calc non Af Amer: 45 mL/min/{1.73_m2} — ABNORMAL LOW (ref >59)
GLOBULIN, TOTAL: 3.2 g/dL (ref 1.5–4.5)
Glucose: 85 mg/dL (ref 65–99)
POTASSIUM: 4.5 mmol/L (ref 3.5–5.2)
Sodium: 138 mmol/L (ref 134–144)
Total Protein: 6.7 g/dL (ref 6.0–8.5)

## 2016-10-16 LAB — CBC
HEMATOCRIT: 34.5 % — AB (ref 37.5–51.0)
Hemoglobin: 11 g/dL — ABNORMAL LOW (ref 13.0–17.7)
MCH: 29.5 pg (ref 26.6–33.0)
MCHC: 31.9 g/dL (ref 31.5–35.7)
MCV: 93 fL (ref 79–97)
Platelets: 183 10*3/uL (ref 150–379)
RBC: 3.73 x10E6/uL — ABNORMAL LOW (ref 4.14–5.80)
RDW: 14.6 % (ref 12.3–15.4)
WBC: 4.6 10*3/uL (ref 3.4–10.8)

## 2016-10-16 LAB — HEMOGLOBIN A1C
Est. average glucose Bld gHb Est-mCnc: 151 mg/dL
HEMOGLOBIN A1C: 6.9 % — AB (ref 4.8–5.6)

## 2017-02-25 ENCOUNTER — Encounter: Payer: Self-pay | Admitting: Family Medicine

## 2017-02-25 ENCOUNTER — Ambulatory Visit (INDEPENDENT_AMBULATORY_CARE_PROVIDER_SITE_OTHER): Payer: Medicare Other | Admitting: Family Medicine

## 2017-02-25 VITALS — BP 126/80 | HR 74 | Temp 97.4°F | Resp 16 | Wt 221.0 lb

## 2017-02-25 DIAGNOSIS — Z23 Encounter for immunization: Secondary | ICD-10-CM | POA: Diagnosis not present

## 2017-02-25 DIAGNOSIS — L03116 Cellulitis of left lower limb: Secondary | ICD-10-CM | POA: Diagnosis not present

## 2017-02-25 DIAGNOSIS — L989 Disorder of the skin and subcutaneous tissue, unspecified: Secondary | ICD-10-CM | POA: Diagnosis not present

## 2017-02-25 MED ORDER — SILVER SULFADIAZINE 1 % EX CREA: 1.0000 "application " | TOPICAL_CREAM | Freq: Every day | CUTANEOUS | 0 refills | Status: DC

## 2017-02-25 NOTE — Progress Notes (Addendum)
Patient: Miguel Torres Male    DOB: March 29, 1931   83 y.o.   MRN: [id number] Visit Date: 02/25/2017  Today's Provider: Lelon Huh, MD   Chief Complaint  Patient presents with  . Skin Problem   Subjective:    HPI Skin Problem  Patient comes in office today accompanied by his daughter with concerns of a possible blister on the back of patients left calf that appeared 3 days ago. Patients daughter states that blister popped and drained clear fluid. Patient denies redness of the skin or feeling of warmth to the touch, daughter has concerns because patient is diabetic.    No Known Allergies   Current Outpatient Prescriptions:  .  allopurinol (ZYLOPRIM) 300 MG tablet, Take 300 mg by mouth 2 (two) times daily., Disp: , Rfl:  .  atenolol-chlorthalidone (TENORETIC) 50-25 MG tablet, TAKE 1 TABLET BY MOUTH DAILY., Disp: 90 tablet, Rfl: 4 .  felodipine (PLENDIL) 2.5 MG 24 hr tablet, TAKE 1 TABLET BY MOUTH ONCE A DAY, Disp: 90 tablet, Rfl: 4 .  ferrous sulfate 325 (65 FE) MG tablet, Take 1 tablet by mouth daily., Disp: , Rfl:  .  fluocinonide ointment (LIDEX) 0.05 %, FLUOCINONIDE, 0.05% (External Ointment) - Historical Medication  (0.05 %) Active, Disp: , Rfl:  .  furosemide (LASIX) 20 MG tablet, Take 1 tablet by mouth daily as needed. Reported on 11/13/2015, Disp: , Rfl:  .  glipiZIDE (GLUCOTROL) 10 MG tablet, TAKE 1 TABLET (10 MG TOTAL) BY MOUTH DAILY., Disp: 90 tablet, Rfl: 3 .  indomethacin (INDOCIN) 50 MG capsule, Take 1 capsule by mouth 3 (three) times daily. Reported on 11/13/2015, Disp: , Rfl:  .  loratadine (CLARITIN) 10 MG tablet, Take 10 mg by mouth daily., Disp: , Rfl:  .  lovastatin (MEVACOR) 40 MG tablet, TAKE 1 TABLET (40 MG TOTAL) BY MOUTH DAILY., Disp: 90 tablet, Rfl: 4 .  metFORMIN (GLUCOPHAGE-XR) 500 MG 24 hr tablet, TAKE 1 TABLET BY MOUTH EVERY DAY, Disp: 90 tablet, Rfl: 4 .  montelukast (SINGULAIR) 10 MG tablet, TAKE 1 TABLET (10 MG TOTAL) BY MOUTH EVERY EVENING.,  Disp: 90 tablet, Rfl: 4 .  pioglitazone (ACTOS) 30 MG tablet, TAKE 1 TABLET (30 MG TOTAL) BY MOUTH DAILY., Disp: 90 tablet, Rfl: 4 .  polyethylene glycol (MIRALAX / GLYCOLAX) packet, Take by mouth., Disp: , Rfl:  .  tamsulosin (FLOMAX) 0.4 MG CAPS capsule, Take 0.4 mg by mouth. Reported on 11/13/2015, Disp: , Rfl:  .  triamcinolone lotion (KENALOG) 0.1 %, TRIAMCINOLONE ACETONIDE, 0.1% (External Lotion)  1 application daily for 0 days  Quantity: 120;  Refills: 3   Ordered :26-November-2010  [name] ;  Started 26-November-2010 Active, Disp: , Rfl:  .  Vitamin D, Ergocalciferol, (DRISDOL) 50000 units CAPS capsule, TAKE ONE CAPSULE BY MOUTH EVERY 7 DAYS, Disp: 12 capsule, Rfl: 4  Review of Systems  Constitutional: Negative for appetite change, chills and fever.  HENT: Negative.   Respiratory: Negative for chest tightness, shortness of breath and wheezing.   Cardiovascular: Negative for chest pain and palpitations.  Gastrointestinal: Negative for abdominal pain, nausea and vomiting.  Endocrine: Negative.   Genitourinary: Negative.   Musculoskeletal: Negative.   Skin: Positive for color change (redness of the skin is noticable upon examination but patient states that it is from psoriasis). Negative for pallor, rash and wound.  Allergic/Immunologic: Negative.   Neurological: Negative.   Hematological: Negative.   Psychiatric/Behavioral: Negative.     Social History  Substance Use Topics  . Smoking status: Never Smoker  . Smokeless tobacco: Never Used  . Alcohol use No   Objective:   BP 126/80   Pulse 74   Temp (!) 97.4 F (36.3 C) (Oral)   Resp 16   Wt 221 lb (100.2 kg)   BMI 33.60 kg/m  Vitals:   02/25/17 1450  BP: 126/80  Pulse: 74  Resp: 16  Temp: (!) 97.4 F (36.3 C)  TempSrc: Oral  Weight: 221 lb (100.2 kg)     Physical Exam  General appearance: alert, well developed, well nourished, cooperative and in no distress Head: Normocephalic, without obvious abnormality,  atraumatic Respiratory: Respirations even and unlabored, normal respiratory rate Extremities: See photo,  Large denuded bullous lesion left anterior lower leg with scan amount yellow discharge.      Assessment & Plan:     1. Skin erosion  - silver sulfADIAZINE (SILVADENE) 1 % cream; Apply 1 application topically daily.  Dispense: 50 g; Refill: 0 - WOUND CULTURE  2. Cellulitis of left lower extremity  - WOUND CULTURE  3. Need for influenza vaccination  - Flu vaccine HIGH DOSE PF  Recheck 1 week.       Lelon Huh, MD  [facility]

## 2017-02-28 LAB — WOUND CULTURE
MICRO NUMBER: 81194194
SPECIMEN QUALITY:: ADEQUATE

## 2017-03-02 ENCOUNTER — Telehealth: Payer: Self-pay

## 2017-03-02 MED ORDER — DOXYCYCLINE HYCLATE 100 MG PO TABS: 100.0000 mg | ORAL_TABLET | Freq: Two times a day (BID) | ORAL | 0 refills | Status: DC

## 2017-03-02 NOTE — Telephone Encounter (Signed)
-----   Message from [name], MD sent at 03/02/2017  8:00 AM EDT ----- Culture is positive for MRSA. Need to add doxycyline 100mg  BID x 10 days. Continue cream that was prescribed at [facility]. Follow up tomorrow as scheduled.

## 2017-03-02 NOTE — Telephone Encounter (Signed)
Advised patient of results. Medication was sent into the pharmacy.  

## 2017-03-03 ENCOUNTER — Ambulatory Visit (INDEPENDENT_AMBULATORY_CARE_PROVIDER_SITE_OTHER): Payer: Medicare Other | Admitting: Family Medicine

## 2017-03-03 ENCOUNTER — Encounter: Payer: Self-pay | Admitting: Family Medicine

## 2017-03-03 VITALS — BP 140/90 | HR 62 | Temp 97.5°F | Resp 18 | Ht 68.0 in | Wt 234.0 lb

## 2017-03-03 DIAGNOSIS — L0390 Cellulitis, unspecified: Secondary | ICD-10-CM

## 2017-03-03 DIAGNOSIS — B9562 Methicillin resistant Staphylococcus aureus infection as the cause of diseases classified elsewhere: Secondary | ICD-10-CM

## 2017-03-03 NOTE — Progress Notes (Signed)
Patient: Miguel Torres Male    DOB: [birth year]   83 y.o.   MRN: [id number] Visit Date: 03/03/2017  Today's Provider: Lelon Huh, MD   Chief Complaint  Patient presents with  . Follow-up   Subjective:    HPI  Skin erosion and Cellulitis of left lower extremity  From 02/25/2017-patient was prescribed silver sulfADIAZINE (SILVADENE) 1 % cream. WOUND CULTURE obtained, growing MRSA. Added doxycycline 100 mg bid x10 days which he started yesterday. Today reporting good compliance with treatment, good tolerance and good symptom control. Patients daughter cleans and wraps the wound daily with a non adhesive gauze and bandage and feels it is looking much better.     No Known Allergies   Current Outpatient Prescriptions:  .  allopurinol (ZYLOPRIM) 300 MG tablet, Take 300 mg by mouth 2 (two) times daily., Disp: , Rfl:  .  atenolol-chlorthalidone (TENORETIC) 50-25 MG tablet, TAKE 1 TABLET BY MOUTH DAILY., Disp: 90 tablet, Rfl: 4 .  doxycycline (VIBRA-TABS) 100 MG tablet, Take 1 tablet (100 mg total) by mouth 2 (two) times daily., Disp: 20 tablet, Rfl: 0 .  felodipine (PLENDIL) 2.5 MG 24 hr tablet, TAKE 1 TABLET BY MOUTH ONCE A DAY, Disp: 90 tablet, Rfl: 4 .  ferrous sulfate 325 (65 FE) MG tablet, Take 1 tablet by mouth daily., Disp: , Rfl:  .  fluocinonide ointment (LIDEX) 0.05 %, FLUOCINONIDE, 0.05% (External Ointment) - Historical Medication  (0.05 %) Active, Disp: , Rfl:  .  furosemide (LASIX) 20 MG tablet, Take 1 tablet by mouth daily as needed. Reported on 11/13/2015, Disp: , Rfl:  .  glipiZIDE (GLUCOTROL) 10 MG tablet, TAKE 1 TABLET (10 MG TOTAL) BY MOUTH DAILY., Disp: 90 tablet, Rfl: 3 .  indomethacin (INDOCIN) 50 MG capsule, Take 1 capsule by mouth 3 (three) times daily. Reported on 11/13/2015, Disp: , Rfl:  .  loratadine (CLARITIN) 10 MG tablet, Take 10 mg by mouth daily., Disp: , Rfl:  .  lovastatin (MEVACOR) 40 MG tablet, TAKE 1 TABLET (40 MG TOTAL) BY MOUTH DAILY.,  Disp: 90 tablet, Rfl: 4 .  metFORMIN (GLUCOPHAGE-XR) 500 MG 24 hr tablet, TAKE 1 TABLET BY MOUTH EVERY DAY, Disp: 90 tablet, Rfl: 4 .  montelukast (SINGULAIR) 10 MG tablet, TAKE 1 TABLET (10 MG TOTAL) BY MOUTH EVERY EVENING., Disp: 90 tablet, Rfl: 4 .  pioglitazone (ACTOS) 30 MG tablet, TAKE 1 TABLET (30 MG TOTAL) BY MOUTH DAILY., Disp: 90 tablet, Rfl: 4 .  polyethylene glycol (MIRALAX / GLYCOLAX) packet, Take by mouth., Disp: , Rfl:  .  silver sulfADIAZINE (SILVADENE) 1 % cream, Apply 1 application topically daily., Disp: 50 g, Rfl: 0 .  tamsulosin (FLOMAX) 0.4 MG CAPS capsule, Take 0.4 mg by mouth. Reported on 11/13/2015, Disp: , Rfl:  .  triamcinolone lotion (KENALOG) 0.1 %, TRIAMCINOLONE ACETONIDE, 0.1% (External Lotion)  1 application daily for 0 days  Quantity: 120;  Refills: 3   Ordered :26-November-2010  [name] ;  Started 26-November-2010 Active, Disp: , Rfl:  .  Vitamin D, Ergocalciferol, (DRISDOL) 50000 units CAPS capsule, TAKE ONE CAPSULE BY MOUTH EVERY 7 DAYS, Disp: 12 capsule, Rfl: 4  Review of Systems  Constitutional: Negative for appetite change, chills and fever.  Respiratory: Negative for chest tightness, shortness of breath and wheezing.   Cardiovascular: Negative for chest pain and palpitations.  Gastrointestinal: Negative for abdominal pain, nausea and vomiting.  Skin: Positive for wound.    Social History  Substance Use Topics  .  Smoking status: Never Smoker  . Smokeless tobacco: Never Used  . Alcohol use No   Objective:   BP 140/90 (BP Location: Right Arm, Patient Position: Sitting, Cuff Size: Large)   Pulse 62   Temp (!) 97.5 F (36.4 C) (Oral)   Resp 18   Ht 5\' 8"  (1.727 m)   Wt 234 lb (106.1 kg)   SpO2 97% Comment: room air  BMI 35.58 kg/m  There were no vitals filed for this visit.   Physical Exam  See photograph of left lower leg. . Wound is scabbing over, but still extensive erythema, which is likely mostly due to underlying psoriasis. 2+ Pitting  edema.     Assessment & Plan:    . 1. MRSA cellulitis Wound is healing well, continue silvadene cream. Continue doxycycline. Recheck in 2 weeks.         Lelon Huh, MD  [facility]

## 2017-03-17 ENCOUNTER — Encounter: Payer: Self-pay | Admitting: Family Medicine

## 2017-03-17 ENCOUNTER — Ambulatory Visit (INDEPENDENT_AMBULATORY_CARE_PROVIDER_SITE_OTHER): Payer: Medicare Other | Admitting: Family Medicine

## 2017-03-17 VITALS — BP 122/72 | HR 60 | Temp 97.7°F | Resp 16 | Ht 68.0 in | Wt 236.0 lb

## 2017-03-17 DIAGNOSIS — L0390 Cellulitis, unspecified: Secondary | ICD-10-CM | POA: Diagnosis not present

## 2017-03-17 DIAGNOSIS — B9562 Methicillin resistant Staphylococcus aureus infection as the cause of diseases classified elsewhere: Secondary | ICD-10-CM

## 2017-03-17 DIAGNOSIS — L989 Disorder of the skin and subcutaneous tissue, unspecified: Secondary | ICD-10-CM

## 2017-03-17 NOTE — Progress Notes (Signed)
Patient: Miguel Torres Male    DOB: 09/28/1930   83 y.o.   MRN: [id number] Visit Date: 03/17/2017  Today's Provider: Lelon Huh, MD   Chief Complaint  Patient presents with  . Follow-up   Subjective:    HPI  MRSA cellulitis From 03/03/2017. Continued silvadene cream and doxycycline which he finished on 03/12/2017. Patient reports good compliance with treatment, good tolerance and good symptom control.   No Known Allergies   Current Outpatient Medications:  .  allopurinol (ZYLOPRIM) 300 MG tablet, Take 300 mg by mouth 2 (two) times daily., Disp: , Rfl:  .  atenolol-chlorthalidone (TENORETIC) 50-25 MG tablet, TAKE 1 TABLET BY MOUTH DAILY., Disp: 90 tablet, Rfl: 4 .  doxycycline (VIBRA-TABS) 100 MG tablet, Take 1 tablet (100 mg total) by mouth 2 (two) times daily., Disp: 20 tablet, Rfl: 0 .  felodipine (PLENDIL) 2.5 MG 24 hr tablet, TAKE 1 TABLET BY MOUTH ONCE A DAY, Disp: 90 tablet, Rfl: 4 .  ferrous sulfate 325 (65 FE) MG tablet, Take 1 tablet by mouth daily., Disp: , Rfl:  .  fluocinonide ointment (LIDEX) 0.05 %, FLUOCINONIDE, 0.05% (External Ointment) - Historical Medication  (0.05 %) Active, Disp: , Rfl:  .  furosemide (LASIX) 20 MG tablet, Take 1 tablet by mouth daily as needed. Reported on 11/13/2015, Disp: , Rfl:  .  glipiZIDE (GLUCOTROL) 10 MG tablet, TAKE 1 TABLET (10 MG TOTAL) BY MOUTH DAILY., Disp: 90 tablet, Rfl: 3 .  indomethacin (INDOCIN) 50 MG capsule, Take 1 capsule by mouth 3 (three) times daily. Reported on 11/13/2015, Disp: , Rfl:  .  loratadine (CLARITIN) 10 MG tablet, Take 10 mg by mouth daily., Disp: , Rfl:  .  lovastatin (MEVACOR) 40 MG tablet, TAKE 1 TABLET (40 MG TOTAL) BY MOUTH DAILY., Disp: 90 tablet, Rfl: 4 .  metFORMIN (GLUCOPHAGE-XR) 500 MG 24 hr tablet, TAKE 1 TABLET BY MOUTH EVERY DAY, Disp: 90 tablet, Rfl: 4 .  montelukast (SINGULAIR) 10 MG tablet, TAKE 1 TABLET (10 MG TOTAL) BY MOUTH EVERY EVENING., Disp: 90 tablet, Rfl: 4 .  pioglitazone  (ACTOS) 30 MG tablet, TAKE 1 TABLET (30 MG TOTAL) BY MOUTH DAILY., Disp: 90 tablet, Rfl: 4 .  polyethylene glycol (MIRALAX / GLYCOLAX) packet, Take by mouth., Disp: , Rfl:  .  silver sulfADIAZINE (SILVADENE) 1 % cream, Apply 1 application topically daily., Disp: 50 g, Rfl: 0 .  tamsulosin (FLOMAX) 0.4 MG CAPS capsule, Take 0.4 mg by mouth. Reported on 11/13/2015, Disp: , Rfl:  .  triamcinolone lotion (KENALOG) 0.1 %, TRIAMCINOLONE ACETONIDE, 0.1% (External Lotion)  1 application daily for 0 days  Quantity: 120;  Refills: 3   Ordered :26-November-2010  [name] ;  Started 26-November-2010 Active, Disp: , Rfl:  .  Vitamin D, Ergocalciferol, (DRISDOL) 50000 units CAPS capsule, TAKE ONE CAPSULE BY MOUTH EVERY 7 DAYS, Disp: 12 capsule, Rfl: 4  Review of Systems  Constitutional: Negative for appetite change, chills and fever.  Respiratory: Negative for chest tightness, shortness of breath and wheezing.   Cardiovascular: Negative for chest pain and palpitations.  Gastrointestinal: Negative for abdominal pain, nausea and vomiting.  Skin: Positive for wound.    Social History   Tobacco Use  . Smoking status: Never Smoker  . Smokeless tobacco: Never Used  Substance Use Topics  . Alcohol use: No   Objective:   BP 122/72 (BP Location: Right Arm, Patient Position: Sitting, Cuff Size: Large)   Pulse 60   Temp  97.7 F (36.5 C) (Oral)   Resp 16   Ht 5\' 8"  (1.727 m)   Wt 236 lb (107 kg)   SpO2 99% Comment: room air  BMI 35.88 kg/m  There were no vitals filed for this visit.   Physical Exam  Wound completed healed and dry. No discharge. Not tender. Has chronic scabbing and erythema due to psoriasis and is now back to baseline.     Assessment & Plan:     1. Skin erosion Has now healed. He may continue to use silvadene if any open sores develop.   2. MRSA cellulitis Resolved. Finished doxycycline.Lelon Huh, MD  [facility]

## 2017-04-06 ENCOUNTER — Other Ambulatory Visit: Payer: Self-pay | Admitting: Family Medicine

## 2017-04-24 ENCOUNTER — Telehealth: Payer: Self-pay | Admitting: Family Medicine

## 2017-04-24 DIAGNOSIS — L989 Disorder of the skin and subcutaneous tissue, unspecified: Secondary | ICD-10-CM

## 2017-04-24 MED ORDER — SILVER SULFADIAZINE 1 % EX CREA: 1.0000 "application " | TOPICAL_CREAM | Freq: Every day | CUTANEOUS | 4 refills | Status: DC

## 2017-04-24 NOTE — Telephone Encounter (Signed)
Please advise 

## 2017-04-24 NOTE — Telephone Encounter (Signed)
Pt daughter [name] is requesting a Rx refill for an antibiotic ointment that pt is using on his legs (she does not know the name of the Rx).  [facility].  NE#[phone]/[id number]

## 2017-05-11 ENCOUNTER — Other Ambulatory Visit: Payer: Self-pay | Admitting: Family Medicine

## 2017-06-26 ENCOUNTER — Other Ambulatory Visit: Payer: Self-pay | Admitting: Family Medicine

## 2017-07-27 ENCOUNTER — Telehealth: Payer: Self-pay | Admitting: Family Medicine

## 2017-07-27 ENCOUNTER — Other Ambulatory Visit: Payer: Self-pay | Admitting: Family Medicine

## 2017-07-27 DIAGNOSIS — L989 Disorder of the skin and subcutaneous tissue, unspecified: Secondary | ICD-10-CM

## 2017-07-27 DIAGNOSIS — I10 Essential (primary) hypertension: Secondary | ICD-10-CM

## 2017-07-27 MED ORDER — SILVER SULFADIAZINE 1 % EX CREA: 1.0000 "application " | TOPICAL_CREAM | Freq: Every day | CUTANEOUS | 4 refills | Status: DC

## 2017-07-27 NOTE — Telephone Encounter (Signed)
Patient needs refill on silver sulfADIAZINE (SILVADENE) 1 % cream     [facility]

## 2017-09-06 ENCOUNTER — Other Ambulatory Visit: Payer: Self-pay | Admitting: Family Medicine

## 2017-09-06 DIAGNOSIS — E559 Vitamin D deficiency, unspecified: Secondary | ICD-10-CM

## 2017-09-24 ENCOUNTER — Other Ambulatory Visit: Payer: Self-pay | Admitting: Family Medicine

## 2017-10-21 ENCOUNTER — Other Ambulatory Visit: Payer: Self-pay | Admitting: Family Medicine

## 2017-10-21 ENCOUNTER — Encounter: Payer: Self-pay | Admitting: Family Medicine

## 2017-10-21 DIAGNOSIS — E559 Vitamin D deficiency, unspecified: Secondary | ICD-10-CM

## 2017-10-30 DIAGNOSIS — L97522 Non-pressure chronic ulcer of other part of left foot with fat layer exposed: Secondary | ICD-10-CM | POA: Diagnosis not present

## 2017-11-01 ENCOUNTER — Emergency Department: Payer: Medicare Other

## 2017-11-01 ENCOUNTER — Inpatient Hospital Stay
Admission: EM | Admit: 2017-11-01 | Discharge: 2017-11-04 | DRG: 558 | Disposition: A | Discharge status: Skilled Nursing Facility | Payer: Medicare Other | Attending: Specialist | Admitting: Specialist

## 2017-11-01 ENCOUNTER — Other Ambulatory Visit: Payer: Self-pay

## 2017-11-01 ENCOUNTER — Inpatient Hospital Stay: Payer: Medicare Other

## 2017-11-01 DIAGNOSIS — I129 Hypertensive chronic kidney disease with stage 1 through stage 4 chronic kidney disease, or unspecified chronic kidney disease: Secondary | ICD-10-CM | POA: Diagnosis present

## 2017-11-01 DIAGNOSIS — N183 Chronic kidney disease, stage 3 (moderate): Secondary | ICD-10-CM | POA: Diagnosis present

## 2017-11-01 DIAGNOSIS — E1122 Type 2 diabetes mellitus with diabetic chronic kidney disease: Secondary | ICD-10-CM | POA: Diagnosis present

## 2017-11-01 DIAGNOSIS — M19011 Primary osteoarthritis, right shoulder: Secondary | ICD-10-CM | POA: Diagnosis present

## 2017-11-01 DIAGNOSIS — I4892 Unspecified atrial flutter: Secondary | ICD-10-CM | POA: Diagnosis present

## 2017-11-01 DIAGNOSIS — R748 Abnormal levels of other serum enzymes: Secondary | ICD-10-CM | POA: Diagnosis not present

## 2017-11-01 DIAGNOSIS — M25511 Pain in right shoulder: Secondary | ICD-10-CM | POA: Diagnosis not present

## 2017-11-01 DIAGNOSIS — L97829 Non-pressure chronic ulcer of other part of left lower leg with unspecified severity: Secondary | ICD-10-CM | POA: Diagnosis not present

## 2017-11-01 DIAGNOSIS — M1990 Unspecified osteoarthritis, unspecified site: Secondary | ICD-10-CM | POA: Diagnosis not present

## 2017-11-01 DIAGNOSIS — E11622 Type 2 diabetes mellitus with other skin ulcer: Secondary | ICD-10-CM | POA: Diagnosis not present

## 2017-11-01 DIAGNOSIS — L8990 Pressure ulcer of unspecified site, unspecified stage: Secondary | ICD-10-CM

## 2017-11-01 DIAGNOSIS — N179 Acute kidney failure, unspecified: Secondary | ICD-10-CM | POA: Diagnosis present

## 2017-11-01 DIAGNOSIS — Z89511 Acquired absence of right leg below knee: Secondary | ICD-10-CM | POA: Diagnosis not present

## 2017-11-01 DIAGNOSIS — M75120 Complete rotator cuff tear or rupture of unspecified shoulder, not specified as traumatic: Principal | ICD-10-CM | POA: Diagnosis present

## 2017-11-01 DIAGNOSIS — E1121 Type 2 diabetes mellitus with diabetic nephropathy: Secondary | ICD-10-CM | POA: Diagnosis present

## 2017-11-01 DIAGNOSIS — E785 Hyperlipidemia, unspecified: Secondary | ICD-10-CM | POA: Diagnosis present

## 2017-11-01 DIAGNOSIS — I272 Pulmonary hypertension, unspecified: Secondary | ICD-10-CM | POA: Diagnosis present

## 2017-11-01 DIAGNOSIS — E1151 Type 2 diabetes mellitus with diabetic peripheral angiopathy without gangrene: Secondary | ICD-10-CM | POA: Diagnosis present

## 2017-11-01 DIAGNOSIS — R778 Other specified abnormalities of plasma proteins: Secondary | ICD-10-CM | POA: Diagnosis present

## 2017-11-01 DIAGNOSIS — M109 Gout, unspecified: Secondary | ICD-10-CM | POA: Diagnosis present

## 2017-11-01 DIAGNOSIS — R001 Bradycardia, unspecified: Secondary | ICD-10-CM | POA: Diagnosis not present

## 2017-11-01 DIAGNOSIS — L89812 Pressure ulcer of head, stage 2: Secondary | ICD-10-CM | POA: Diagnosis present

## 2017-11-01 DIAGNOSIS — H9190 Unspecified hearing loss, unspecified ear: Secondary | ICD-10-CM | POA: Diagnosis present

## 2017-11-01 DIAGNOSIS — I361 Nonrheumatic tricuspid (valve) insufficiency: Secondary | ICD-10-CM | POA: Diagnosis not present

## 2017-11-01 DIAGNOSIS — Z85828 Personal history of other malignant neoplasm of skin: Secondary | ICD-10-CM

## 2017-11-01 DIAGNOSIS — L89894 Pressure ulcer of other site, stage 4: Secondary | ICD-10-CM | POA: Diagnosis not present

## 2017-11-01 DIAGNOSIS — L97529 Non-pressure chronic ulcer of other part of left foot with unspecified severity: Secondary | ICD-10-CM | POA: Diagnosis present

## 2017-11-01 DIAGNOSIS — E119 Type 2 diabetes mellitus without complications: Secondary | ICD-10-CM | POA: Diagnosis not present

## 2017-11-01 DIAGNOSIS — I248 Other forms of acute ischemic heart disease: Secondary | ICD-10-CM | POA: Diagnosis present

## 2017-11-01 DIAGNOSIS — E11621 Type 2 diabetes mellitus with foot ulcer: Secondary | ICD-10-CM | POA: Diagnosis present

## 2017-11-01 DIAGNOSIS — Z8673 Personal history of transient ischemic attack (TIA), and cerebral infarction without residual deficits: Secondary | ICD-10-CM

## 2017-11-01 DIAGNOSIS — Z7984 Long term (current) use of oral hypoglycemic drugs: Secondary | ICD-10-CM | POA: Diagnosis not present

## 2017-11-01 DIAGNOSIS — E559 Vitamin D deficiency, unspecified: Secondary | ICD-10-CM | POA: Diagnosis not present

## 2017-11-01 DIAGNOSIS — E860 Dehydration: Secondary | ICD-10-CM | POA: Diagnosis present

## 2017-11-01 DIAGNOSIS — M75121 Complete rotator cuff tear or rupture of right shoulder, not specified as traumatic: Secondary | ICD-10-CM | POA: Diagnosis not present

## 2017-11-01 DIAGNOSIS — D649 Anemia, unspecified: Secondary | ICD-10-CM | POA: Diagnosis not present

## 2017-11-01 DIAGNOSIS — I10 Essential (primary) hypertension: Secondary | ICD-10-CM | POA: Diagnosis not present

## 2017-11-01 DIAGNOSIS — Z85820 Personal history of malignant melanoma of skin: Secondary | ICD-10-CM

## 2017-11-01 DIAGNOSIS — D631 Anemia in chronic kidney disease: Secondary | ICD-10-CM | POA: Diagnosis present

## 2017-11-01 DIAGNOSIS — Z8261 Family history of arthritis: Secondary | ICD-10-CM

## 2017-11-01 DIAGNOSIS — R7989 Other specified abnormal findings of blood chemistry: Secondary | ICD-10-CM | POA: Diagnosis present

## 2017-11-01 DIAGNOSIS — R0602 Shortness of breath: Secondary | ICD-10-CM | POA: Diagnosis not present

## 2017-11-01 DIAGNOSIS — Z833 Family history of diabetes mellitus: Secondary | ICD-10-CM

## 2017-11-01 DIAGNOSIS — I482 Chronic atrial fibrillation: Secondary | ICD-10-CM | POA: Diagnosis present

## 2017-11-01 HISTORY — DX: Type 2 diabetes mellitus without complications: E11.9

## 2017-11-01 HISTORY — DX: Unspecified malignant neoplasm of skin, unspecified: C44.90

## 2017-11-01 LAB — COMPREHENSIVE METABOLIC PANEL
ALBUMIN: 3 g/dL — AB (ref 3.5–5.0)
ALT: 17 U/L (ref 0–44)
ANION GAP: 8 (ref 5–15)
AST: 39 U/L (ref 15–41)
Alkaline Phosphatase: 110 U/L (ref 38–126)
BUN: 40 mg/dL — AB (ref 8–23)
CHLORIDE: 103 mmol/L (ref 98–111)
CO2: 25 mmol/L (ref 22–32)
Calcium: 8.5 mg/dL — ABNORMAL LOW (ref 8.9–10.3)
Creatinine, Ser: 1.7 mg/dL — ABNORMAL HIGH (ref 0.61–1.24)
GFR calc Af Amer: 41 mL/min — ABNORMAL LOW (ref >60)
GFR, EST NON AFRICAN AMERICAN: 35 mL/min — AB (ref >60)
Glucose, Bld: 89 mg/dL (ref 70–99)
POTASSIUM: 4.3 mmol/L (ref 3.5–5.1)
SODIUM: 136 mmol/L (ref 135–145)
Total Bilirubin: 0.6 mg/dL (ref 0.3–1.2)
Total Protein: 7.3 g/dL (ref 6.5–8.1)

## 2017-11-01 LAB — CBC WITH DIFFERENTIAL/PLATELET
Basophils Absolute: 0.1 10*3/uL (ref 0–0.1)
Basophils Relative: 1 %
Eosinophils Absolute: 0.1 10*3/uL (ref 0–0.7)
Eosinophils Relative: 1 %
HEMATOCRIT: 32 % — AB (ref 40.0–52.0)
HEMOGLOBIN: 10.7 g/dL — AB (ref 13.0–18.0)
LYMPHS PCT: 7 %
Lymphs Abs: 0.4 10*3/uL — ABNORMAL LOW (ref 1.0–3.6)
MCH: 31.5 pg (ref 26.0–34.0)
MCHC: 33.6 g/dL (ref 32.0–36.0)
MCV: 93.8 fL (ref 80.0–100.0)
MONO ABS: 0.5 10*3/uL (ref 0.2–1.0)
Monocytes Relative: 8 %
NEUTROS ABS: 5.1 10*3/uL (ref 1.4–6.5)
NEUTROS PCT: 83 %
Platelets: 158 10*3/uL (ref 150–440)
RBC: 3.41 MIL/uL — AB (ref 4.40–5.90)
RDW: 14.4 % (ref 11.5–14.5)
WBC: 6.2 10*3/uL (ref 3.8–10.6)

## 2017-11-01 LAB — GLUCOSE, CAPILLARY
Glucose-Capillary: 104 mg/dL — ABNORMAL HIGH (ref 70–99)
Glucose-Capillary: 191 mg/dL — ABNORMAL HIGH (ref 70–99)

## 2017-11-01 LAB — LIPID PANEL
Cholesterol: 90 mg/dL (ref 0–200)
HDL: 48 mg/dL (ref >40)
LDL CALC: 31 mg/dL (ref 0–99)
Total CHOL/HDL Ratio: 1.9 RATIO
Triglycerides: 56 mg/dL (ref <150)
VLDL: 11 mg/dL (ref 0–40)

## 2017-11-01 LAB — TROPONIN I
TROPONIN I: 0.26 ng/mL — AB (ref <0.03)
TROPONIN I: 0.21 ng/mL — AB (ref <0.03)
Troponin I: 0.28 ng/mL (ref <0.03)

## 2017-11-01 LAB — URIC ACID: Uric Acid, Serum: 10.5 mg/dL — ABNORMAL HIGH (ref 3.7–8.6)

## 2017-11-01 LAB — TSH: TSH: 3.067 u[IU]/mL (ref 0.350–4.500)

## 2017-11-01 MED ORDER — OXYCODONE HCL 5 MG PO TABS: 5.0000 mg | ORAL_TABLET | ORAL | Status: DC | PRN

## 2017-11-01 MED ORDER — LORATADINE 10 MG PO TABS
10.0000 mg | ORAL_TABLET | Freq: Every day | ORAL | Status: DC
  Administered 2017-11-01 – 2017-11-04 (×4): 10 mg via ORAL
  Filled 2017-11-01 (×4): qty 1

## 2017-11-01 MED ORDER — METFORMIN HCL ER 500 MG PO TB24
500.0000 mg | ORAL_TABLET | Freq: Every day | ORAL | Status: DC
  Administered 2017-11-02 – 2017-11-04 (×3): 500 mg via ORAL
  Filled 2017-11-01 (×3): qty 1

## 2017-11-01 MED ORDER — MONTELUKAST SODIUM 10 MG PO TABS
10.0000 mg | ORAL_TABLET | Freq: Every day | ORAL | Status: DC
  Administered 2017-11-01 – 2017-11-03 (×3): 10 mg via ORAL
  Filled 2017-11-01 (×3): qty 1

## 2017-11-01 MED ORDER — ACETAMINOPHEN 650 MG RE SUPP: 650.0000 mg | Freq: Four times a day (QID) | RECTAL | Status: DC | PRN

## 2017-11-01 MED ORDER — GLIPIZIDE 5 MG PO TABS
2.5000 mg | ORAL_TABLET | Freq: Every day | ORAL | Status: DC
  Administered 2017-11-02 – 2017-11-04 (×3): 2.5 mg via ORAL
  Filled 2017-11-01 (×3): qty 0.5

## 2017-11-01 MED ORDER — ATENOLOL 25 MG PO TABS
ORAL_TABLET | ORAL | Status: AC
  Administered 2017-11-01: 15:00:00
  Filled 2017-11-01: qty 2

## 2017-11-01 MED ORDER — INSULIN ASPART 100 UNIT/ML ~~LOC~~ SOLN
0.0000 [IU] | Freq: Three times a day (TID) | SUBCUTANEOUS | Status: DC
  Administered 2017-11-02 – 2017-11-03 (×4): 1 [IU] via SUBCUTANEOUS
  Administered 2017-11-03: 2 [IU] via SUBCUTANEOUS
  Administered 2017-11-04: 1 [IU] via SUBCUTANEOUS
  Filled 2017-11-01 (×6): qty 1

## 2017-11-01 MED ORDER — ATENOLOL 25 MG PO TABS
50.0000 mg | ORAL_TABLET | Freq: Every day | ORAL | Status: DC
  Administered 2017-11-01 – 2017-11-02 (×2): 50 mg via ORAL
  Filled 2017-11-01: qty 2

## 2017-11-01 MED ORDER — ONDANSETRON HCL 4 MG PO TABS: 4.0000 mg | ORAL_TABLET | Freq: Four times a day (QID) | ORAL | Status: DC | PRN

## 2017-11-01 MED ORDER — ASPIRIN 81 MG PO CHEW
81.0000 mg | CHEWABLE_TABLET | Freq: Every day | ORAL | Status: DC
  Administered 2017-11-01 – 2017-11-02 (×2): 81 mg via ORAL
  Filled 2017-11-01: qty 1

## 2017-11-01 MED ORDER — FERROUS SULFATE 325 (65 FE) MG PO TABS
325.0000 mg | ORAL_TABLET | Freq: Every day | ORAL | Status: DC
  Administered 2017-11-01 – 2017-11-04 (×4): 325 mg via ORAL
  Filled 2017-11-01 (×4): qty 1

## 2017-11-01 MED ORDER — POLYETHYLENE GLYCOL 3350 17 G PO PACK: 17.0000 g | PACK | Freq: Every day | ORAL | Status: DC | PRN

## 2017-11-01 MED ORDER — FELODIPINE ER 2.5 MG PO TB24
2.5000 mg | ORAL_TABLET | Freq: Every day | ORAL | Status: DC
  Administered 2017-11-02 – 2017-11-04 (×3): 2.5 mg via ORAL
  Filled 2017-11-01 (×5): qty 1

## 2017-11-01 MED ORDER — ONDANSETRON HCL 4 MG/2ML IJ SOLN: 4.0000 mg | Freq: Four times a day (QID) | INTRAMUSCULAR | Status: DC | PRN

## 2017-11-01 MED ORDER — COLLAGENASE 250 UNIT/GM EX OINT
TOPICAL_OINTMENT | Freq: Every day | CUTANEOUS | Status: DC
  Administered 2017-11-01 – 2017-11-04 (×4): via TOPICAL
  Filled 2017-11-01: qty 30

## 2017-11-01 MED ORDER — VITAMIN D (ERGOCALCIFEROL) 1.25 MG (50000 UNIT) PO CAPS
50000.0000 [IU] | ORAL_CAPSULE | ORAL | Status: DC
  Administered 2017-11-04: 50000 [IU] via ORAL
  Filled 2017-11-01: qty 1

## 2017-11-01 MED ORDER — INSULIN ASPART 100 UNIT/ML ~~LOC~~ SOLN: 0.0000 [IU] | Freq: Every day | SUBCUTANEOUS | Status: DC

## 2017-11-01 MED ORDER — MORPHINE SULFATE (PF) 2 MG/ML IV SOLN
2.0000 mg | Freq: Once | INTRAVENOUS | Status: AC
  Administered 2017-11-01: 2 mg via INTRAVENOUS
  Filled 2017-11-01: qty 1

## 2017-11-01 MED ORDER — ENOXAPARIN SODIUM 40 MG/0.4ML ~~LOC~~ SOLN
40.0000 mg | SUBCUTANEOUS | Status: DC
  Administered 2017-11-01: 40 mg via SUBCUTANEOUS
  Filled 2017-11-01: qty 0.4

## 2017-11-01 MED ORDER — ACETAMINOPHEN 325 MG PO TABS: 650.0000 mg | ORAL_TABLET | Freq: Four times a day (QID) | ORAL | Status: DC | PRN

## 2017-11-01 MED ORDER — SODIUM CHLORIDE 0.9 % IV SOLN
INTRAVENOUS | Status: DC
  Administered 2017-11-01 – 2017-11-02 (×2): via INTRAVENOUS

## 2017-11-01 MED ORDER — ASPIRIN 81 MG PO CHEW
CHEWABLE_TABLET | ORAL | Status: AC
  Administered 2017-11-01: 15:00:00
  Filled 2017-11-01: qty 1

## 2017-11-01 MED ORDER — PRAVASTATIN SODIUM 40 MG PO TABS
40.0000 mg | ORAL_TABLET | Freq: Every day | ORAL | Status: DC
  Administered 2017-11-01 – 2017-11-03 (×3): 40 mg via ORAL
  Filled 2017-11-01 (×3): qty 1

## 2017-11-01 NOTE — ED Notes (Addendum)
See triage note  Presents with right shoulder pain for couple of days  W/o injury  Also having increased SOB and feeling weak    Currently has a diabetic ulcer to left foot

## 2017-11-01 NOTE — ED Triage Notes (Signed)
Pt arrives with family for R shoulder pain since Wednesday. Denies falling on it. Family states he scratches the skin there. Pt wants an xray. Hx DM, has wound on L foot that is being treated. Alert, oriented, HOH.

## 2017-11-01 NOTE — ED Notes (Signed)
Assumed care of pt who is resting with even/unlabored respirations. Pt denies complaints at this time. Call bell within reach, will continue to monitor.

## 2017-11-01 NOTE — H&P (Addendum)
[place] [facility] [place] NAME: Miguel Torres    MR#:  [id number]  DATE OF BIRTH:  1930-08-28  DATE OF ADMISSION:  11/01/2017  PRIMARY CARE PHYSICIAN: [name], MD   REQUESTING/REFERRING PHYSICIAN: Dr. Arta Silence  CHIEF COMPLAINT:   Chief Complaint  Patient presents with  . Shoulder Pain    HISTORY OF PRESENT ILLNESS:  Miguel Torres  is a 84 y.o. male with a known history of non-insulin-dependent diabetes mellitus, hypertension, skin cancer, hyperlipidemia presents to hospital secondary to worsening right shoulder pain for 3 days now. Patient lives by himself at home, uses a walker to ambulate.  For the last 3 days he is been having right shoulder pain.  He thinks it started by him rubbing against 1 of the skin cancer sites very deeply.  It started as initial tenderness and since then its been hurting whenever he tries to move his shoulder especially with internal rotation and abduction.  Denies any trauma to the shoulder.  It is getting hard for him to do his basic activities including eating and grooming.  He is right-handed.  So he presents to the emergency room today.  Labs indicate a little bit of dehydration.  However troponin is elevated at 0.26.  He denies any chest pain.  Has been having mild shortness of breath for the last couple of days.  He thought it was secondary to his anxiety.  Denies any nausea or vomiting.  No diaphoresis.  No prior cardiac history.  PAST MEDICAL HISTORY:   Past Medical History:  Diagnosis Date  . Anemia   . Diabetes mellitus without complication ([employer])   . Hyperlipidemia   . Hypertension   . Skin cancer     PAST SURGICAL HISTORY:   Past Surgical History:  Procedure Laterality Date  . BASAL CELL CARCINOMA EXCISION    . History of eye surgery Bilateral   . MOHS SURGERY  04/2011   the top of his head  . Right BKA  09/12/2010   Dr. Delana Meyer; Secondary gangrenous right LE  .  TONSILLECTOMY      SOCIAL HISTORY:   Social History   Tobacco Use  . Smoking status: Never Smoker  . Smokeless tobacco: Never Used  Substance Use Topics  . Alcohol use: No    FAMILY HISTORY:   Family History  Problem Relation Age of Onset  . Diabetes Mother   . Arthritis Sister   . Diabetes Daughter     DRUG ALLERGIES:  No Known Allergies  REVIEW OF SYSTEMS:   Review of Systems  Constitutional: Negative for chills, fever, malaise/fatigue and weight loss.  HENT: Positive for hearing loss. Negative for ear discharge, ear pain, nosebleeds and tinnitus.   Eyes: Negative for blurred vision, double vision and photophobia.  Respiratory: Negative for cough, hemoptysis, shortness of breath and wheezing.   Cardiovascular: Negative for chest pain, palpitations, orthopnea and leg swelling.  Gastrointestinal: Negative for abdominal pain, constipation, diarrhea, heartburn, melena, nausea and vomiting.  Genitourinary: Negative for dysuria, frequency, hematuria and urgency.  Musculoskeletal: Positive for joint pain and myalgias. Negative for back pain and neck pain.  Skin: Negative for rash.  Neurological: Negative for dizziness, tingling, tremors, sensory change, speech change, focal weakness and headaches.  Endo/Heme/Allergies: Does not bruise/bleed easily.  Psychiatric/Behavioral: Negative for depression.    MEDICATIONS AT HOME:   Prior to Admission medications   Medication Sig Start Date End Date Taking? Authorizing Provider  atenolol-chlorthalidone (TENORETIC)  50-25 MG tablet TAKE 1 TABLET BY MOUTH DAILY. 09/24/17  Yes [name], MD  felodipine (PLENDIL) 2.5 MG 24 hr tablet TAKE 1 TABLET BY MOUTH ONCE A DAY 07/27/17  Yes [name], MD  ferrous sulfate 325 (65 FE) MG tablet Take 1 tablet by mouth daily. 06/05/11  Yes [provider]  glipiZIDE (GLUCOTROL) 10 MG tablet TAKE 1 TABLET (10 MG TOTAL) BY MOUTH DAILY. 04/06/17  Yes [name], MD  loratadine  (CLARITIN) 10 MG tablet Take 10 mg by mouth daily.   Yes [provider]  lovastatin (MEVACOR) 40 MG tablet TAKE 1 TABLET (40 MG TOTAL) BY MOUTH DAILY. 05/11/17  Yes [name], MD  metFORMIN (GLUCOPHAGE-XR) 500 MG 24 hr tablet TAKE 1 TABLET BY MOUTH EVERY DAY 09/16/16  Yes [name], MD  montelukast (SINGULAIR) 10 MG tablet TAKE 1 TABLET (10 MG TOTAL) BY MOUTH EVERY EVENING. 06/27/17  Yes [name], MD  pioglitazone (ACTOS) 30 MG tablet TAKE 1 TABLET (30 MG TOTAL) BY MOUTH DAILY. 10/11/16  Yes [name], MD  polyethylene glycol (MIRALAX / GLYCOLAX) packet Take 17 g by mouth daily as needed for mild constipation.  02/04/13  Yes [provider]  silver sulfADIAZINE (SILVADENE) 1 % cream Apply 1 application topically daily. 07/27/17  Yes [name], MD  Vitamin D, Ergocalciferol, (DRISDOL) 50000 units CAPS capsule TAKE ONE CAPSULE BY MOUTH EVERY 7 DAYS 10/21/17  Yes [name], MD  doxycycline (VIBRA-TABS) 100 MG tablet Take 1 tablet (100 mg total) by mouth 2 (two) times daily. Patient not taking: Reported on 11/01/2017 03/02/17   [name], MD      VITAL SIGNS:  Blood pressure (!) 163/72, pulse 75, temperature 97.6 F (36.4 C), temperature source Oral, resp. rate 16, height 5\' 8"  (1.727 m), weight 108 kg (238 lb), SpO2 97 %.  PHYSICAL EXAMINATION:   Physical Exam  GENERAL:  84 y.o.-year-old patient lying in the bed with no acute distress. Very hard of hearing EYES: Pupils equal, round, reactive to light and accommodation. No scleral icterus. Extraocular muscles intact.  HEENT: Head atraumatic, normocephalic. Oropharynx and nasopharynx clear.  NECK:  Supple, no jugular venous distention. No thyroid enlargement, no tenderness.  LUNGS: Normal breath sounds bilaterally, no wheezing, rales,rhonchi or crepitation. No use of accessory muscles of respiration.  CARDIOVASCULAR: S1, S2 normal. No rubs, or gallops.  2/6 systolic murmur is  present ABDOMEN: Soft, nontender, nondistended. Bowel sounds present. No organomegaly or mass.  EXTREMITIES: Status post right BKA, prosthetic leg in place.  Wound along the lateral border of left foot.  Chronic skin changes of the left leg with erythema and swelling.   No pedal edema, cyanosis, or clubbing.  NEUROLOGIC: Cranial nerves II through XII are intact. Muscle strength 5/5 in all extremities. Sensation intact. Gait not checked.  PSYCHIATRIC: The patient is alert and oriented x 3.  SKIN: No obvious rash, lesion, or ulcer.  Seborrheic keratosis all over the scalp and upper chest.  Evidence of previous surgical removal.  LABORATORY PANEL:   CBC Recent Labs  Lab 11/01/17 1042  WBC 6.2  HGB 10.7*  HCT 32.0*  PLT 158   ------------------------------------------------------------------------------------------------------------------  Chemistries  Recent Labs  Lab 11/01/17 1042  NA 136  K 4.3  CL 103  CO2 25  GLUCOSE 89  BUN 40*  CREATININE 1.70*  CALCIUM 8.5*  AST 39  ALT 17  ALKPHOS 110  BILITOT 0.6   ------------------------------------------------------------------------------------------------------------------  Cardiac Enzymes Recent Labs  Lab 11/01/17 1042  TROPONINI 0.26*   ------------------------------------------------------------------------------------------------------------------  RADIOLOGY:  Dg Chest 2 View  Result Date: 11/01/2017 CLINICAL DATA:  Diffuse right shoulder pain without trauma. Shortness of breath. EXAM: CHEST - 2 VIEW COMPARISON:  Sep 30, 2016 FINDINGS: No pneumothorax. Small effusions, left greater than right, with underlying atelectasis. No nodules or masses. No focal infiltrates. No overt edema. The cardiomediastinal silhouette is stable. IMPRESSION: Small bilateral pleural effusions, left greater than right with underlying atelectasis. Electronically Signed   By: Dorise Bullion III M.D   On: 11/01/2017 10:18   Dg Shoulder  Right  Result Date: 11/01/2017 CLINICAL DATA:  Pain. EXAM: RIGHT SHOULDER - 2+ VIEW COMPARISON:  None. FINDINGS: Mild AC joint degenerative changes. No fracture dislocation. No cause for pain otherwise noted. IMPRESSION: Mild AC joint degenerative changes.  No other abnormalities. Electronically Signed   By: Dorise Bullion III M.D   On: 11/01/2017 10:19    EKG:   Orders placed or performed during the hospital encounter of 11/01/17  . EKG 12-Lead  . EKG 12-Lead    IMPRESSION AND PLAN:   Miguel Torres  is a 84 y.o. male with a known history of non-insulin-dependent diabetes mellitus, hypertension, skin cancer, hyperlipidemia presents to hospital secondary to worsening right shoulder pain for 3 days now.  1.  Right shoulder pain-could be nerve impingement or rotator cuff etiology pain.  Especially limiting his movement.  Also has history of gout. -Check uric acid.  Pain medication -MRI of right shoulder and physical therapy consult.  2.  Elevated troponin-no chest pain, no prior cardiac history.  But does complain of some shortness of breath. -Admit to telemetry, hold off on heparin drip unless repeat troponins are elevated -Recycle troponins.  Cardiology consulted -Started on aspirin, continue statin -Continue atenolol  3.  Diabetes mellitus-continue glipizide and metformin -Started on sliding scale insulin while in the hospital  4.  Acute renal failure on CKD stage III-baseline creatinine around 1.4.  Gentle hydration, hold chlorthalidone for now  5.  DVT prophylaxis-on Lovenox  6.  Left foot wound-wound care consult.  Diabetic foot wound. Has an appointment with the wound care clinic in 2-3 weeks    All the records are reviewed and case discussed with ED provider. Management plans discussed with the patient, family and they are in agreement.  CODE STATUS: Full Code  TOTAL TIME TAKING CARE OF THIS PATIENT: 50 minutes.    Miguel Torres M.D on 11/01/2017 at 12:25  PM  Between 7am to 6pm - Pager - [phone]  After 6pm go to [email or web address] - password [email or web address] [facility]  Office  [phone]  CC: Primary care physician; [name], MD

## 2017-11-01 NOTE — ED Provider Notes (Signed)
[facility] Emergency Department Provider Note ____________________________________________   None    (approximate)  I have reviewed the triage vital signs and the nursing notes.   HISTORY  Chief Complaint Shoulder Pain    HPI Miguel Torres is a 84 y.o. male with PMH as noted below who presents with right shoulder pain over the last few days, worse with movements, and associated with a scabbed wound on the top of the shoulder which she has been scratching.  Patient states that at times when the pain flares up he feels short of breath.  He denies any chest pain or other acute pain or fever.  The patient has diabetes and has an ulcer to the left foot which is currently being monitored and treated with topical antibiotics.  He denies any trauma or injury to the shoulder.  Past Medical History:  Diagnosis Date  . Anemia   . Diabetes mellitus without complication ([place])   . Hyperlipidemia   . Hypertension     Patient Active Problem List   Diagnosis Date Noted  . Microalbuminuria 10/15/2016  . Vitamin D deficiency 07/12/2015  . Retinopathy, diabetic, proliferative ([place]) 07/02/2015  . Bleeding duodenal ulcer 02/02/2015  . Atrial flutter ([place]) 01/19/2015  . Allergic rhinitis 01/17/2015  . Anemia of chronic disease 01/17/2015  . Chronic kidney disease (CKD), stage III (moderate) ([place]) 01/17/2015  . Diabetes mellitus with nephropathy ([place]) 01/17/2015  . Edema 01/17/2015  . Gout 01/17/2015  . Amputation of right lower extremity below knee upon examination ([place]) 01/17/2015  . HLD (hyperlipidemia) 01/17/2015  . Psoriasis 01/17/2015  . CA of skin 01/17/2015  . Hypertension 10/27/2014  . Mild cognitive disorder 12/05/2013  . Cancer of skin, squamous cell 01/21/2013  . Squamous cell carcinoma of scalp and skin of neck 03/03/2011    Past Surgical History:  Procedure Laterality Date  . BASAL CELL CARCINOMA EXCISION    . History of eye surgery Bilateral    . MOHS SURGERY  04/2011   the top of his head  . Right BKA  09/12/2010   Dr. Delana Meyer; Secondary gangrenous right LE  . TONSILLECTOMY      Prior to Admission medications   Medication Sig Start Date End Date Taking? Authorizing Provider  atenolol-chlorthalidone (TENORETIC) 50-25 MG tablet TAKE 1 TABLET BY MOUTH DAILY. 09/24/17  Yes [name], MD  felodipine (PLENDIL) 2.5 MG 24 hr tablet TAKE 1 TABLET BY MOUTH ONCE A DAY 07/27/17  Yes [name], MD  ferrous sulfate 325 (65 FE) MG tablet Take 1 tablet by mouth daily. 06/05/11  Yes [provider]  glipiZIDE (GLUCOTROL) 10 MG tablet TAKE 1 TABLET (10 MG TOTAL) BY MOUTH DAILY. 04/06/17  Yes [name], MD  loratadine (CLARITIN) 10 MG tablet Take 10 mg by mouth daily.   Yes [provider]  lovastatin (MEVACOR) 40 MG tablet TAKE 1 TABLET (40 MG TOTAL) BY MOUTH DAILY. 05/11/17  Yes [name], MD  metFORMIN (GLUCOPHAGE-XR) 500 MG 24 hr tablet TAKE 1 TABLET BY MOUTH EVERY DAY 09/16/16  Yes [name], MD  montelukast (SINGULAIR) 10 MG tablet TAKE 1 TABLET (10 MG TOTAL) BY MOUTH EVERY EVENING. 06/27/17  Yes [name], MD  pioglitazone (ACTOS) 30 MG tablet TAKE 1 TABLET (30 MG TOTAL) BY MOUTH DAILY. 10/11/16  Yes [name], MD  polyethylene glycol (MIRALAX / GLYCOLAX) packet Take 17 g by mouth daily as needed for mild constipation.  02/04/13  Yes [provider]  silver sulfADIAZINE (SILVADENE) 1 % cream Apply 1 application topically daily. 07/27/17  Yes [name], MD  Vitamin D, Ergocalciferol, (DRISDOL) 50000 units CAPS capsule TAKE ONE CAPSULE BY MOUTH EVERY 7 DAYS 10/21/17  Yes [name], MD  doxycycline (VIBRA-TABS) 100 MG tablet Take 1 tablet (100 mg total) by mouth 2 (two) times daily. Patient not taking: Reported on 11/01/2017 03/02/17   [name], MD    Allergies Patient has no known allergies.  Family History  Problem Relation Age of Onset  . Diabetes  Mother   . Arthritis Sister   . Diabetes Daughter     Social History Social History   Tobacco Use  . Smoking status: Never Smoker  . Smokeless tobacco: Never Used  Substance Use Topics  . Alcohol use: No  . Drug use: No    Review of Systems  Constitutional: No fever. Eyes: No redness. ENT: No neck pain. Cardiovascular: Denies chest pain. Respiratory: Positive for intermittent shortness of breath. Gastrointestinal: No vomiting.  Genitourinary: Negative for flank pain.  Musculoskeletal: Negative for back pain. Skin: Negative for rash. Neurological: Negative for headache.   ____________________________________________   PHYSICAL EXAM:  VITAL SIGNS: ED Triage Vitals [11/01/17 0855]  Enc Vitals Group     BP (!) 137/47     Pulse Rate 76     Resp 16     Temp 97.6 F (36.4 C)     Temp Source Oral     SpO2 100 %     Weight 238 lb (108 kg)     Height 5\' 8"  (1.727 m)     Head Circumference      Peak Flow      Pain Score 10     Pain Loc      Pain Edu?      Excl. in [place]?     Constitutional: Alert and oriented.  Relatively comfortable appearing and in no acute distress. Eyes: Conjunctivae are normal.  Head: Atraumatic. Nose: No congestion/rhinnorhea. Mouth/Throat: Mucous membranes are moist.   Neck: Normal range of motion.  Cardiovascular: Normal rate, regular rhythm. Grossly normal heart sounds.  Good peripheral circulation. Respiratory: Normal respiratory effort.  No retractions. Lungs CTAB. Gastrointestinal: No distention.  Musculoskeletal: No lower extremity edema.  Extremities warm and well perfused.  Right shoulder with some pain on range of motion, but no restriction in range of motion.  Mild tenderness to the superior lateral aspect.  No deformity. Neurologic:  Normal speech and language. No gross focal neurologic deficits are appreciated.  Motor and sensory intact in all nerve distributions to right arm. Skin:  Skin is warm and dry.  Right shoulder with  approximately 1 cm scabbed wound with no significant surrounding erythema, induration, or warmth. Psychiatric: Mood and affect are normal. Speech and behavior are normal.  ____________________________________________   LABS (all labs ordered are listed, but only abnormal results are displayed)  Labs Reviewed  COMPREHENSIVE METABOLIC PANEL - Abnormal; Notable for the following components:      Result Value   BUN 40 (*)    Creatinine, Ser 1.70 (*)    Calcium 8.5 (*)    Albumin 3.0 (*)    GFR calc non Af Amer 35 (*)    GFR calc Af Amer 41 (*)    All other components within normal limits  TROPONIN I - Abnormal; Notable for the following components:   Troponin I 0.26 (*)    All other components within  normal limits  CBC WITH DIFFERENTIAL/PLATELET - Abnormal; Notable for the following components:   RBC 3.41 (*)    Hemoglobin 10.7 (*)    HCT 32.0 (*)    Lymphs Abs 0.4 (*)    All other components within normal limits   ____________________________________________  EKG  ED ECG REPORT I, Arta Silence, the attending physician, personally viewed and interpreted this ECG.  Date: 11/01/2017 EKG Time: 923 Rate: 74 Rhythm: normal sinus rhythm QRS Axis: normal Intervals: Incomplete RBBB ST/T Wave abnormalities: normal Narrative Interpretation: no evidence of acute ischemia  ____________________________________________  RADIOLOGY  CXR: Small bilateral pleural effusions XR R shoulder: Degenerative changes with no acute findings  ____________________________________________   PROCEDURES  Procedure(s) performed: No  Procedures  Critical Care performed: No ____________________________________________   INITIAL IMPRESSION / ASSESSMENT AND PLAN / ED COURSE  Pertinent labs & imaging results that were available during my care of the patient were reviewed by me and considered in my medical decision making (see chart for details).  84 year old male with PMH as noted  above presents with atraumatic pain to his right shoulder over the last several days.  He reports a scabbed wound to the top of the shoulder which she has been scratching, and he states that when the pain flares up he feels short of breath but he denies chest pain or other acute symptoms.  On exam, the patient is relatively comfortable appearing while at rest.  Vital signs are normal, and the remainder of the exam is as described above.  The right arm is neuro/vascular intact.  There is no deformity, and no open wound or evidence of cellulitis.  The family member showed me a picture of the ulcer to the left foot which is currently dressed.  The patient has had no increased pain there and is being treated with a topical antibiotic with wound care at home.  Overall I suspect most likely acute on chronic musculoskeletal cause such as DJD, arthritis, tendinitis, or muscle strain.  There is no evidence of cellulitis, and no signs of joint infection.  I have a lower suspicion for cardiac etiology given that the pain is fairly localized, however given the patient's comorbidities he is at increased risk for ACS.  Plan: Shoulder and chest x-rays, labs, and reassess.  ----------------------------------------- 11:48 AM on 11/01/2017 -----------------------------------------  Patient's x-rays were unremarkable for significant acute findings, his troponin is elevated.  I suspect that the patient's shortness of breath may be an ACS equivalent.  He has no recent troponins for comparison.  I discussed this with the patient the family and they agree with admission.  I signed the patient out to the hospitalist Dr. Tressia Miners. ____________________________________________   FINAL CLINICAL IMPRESSION(S) / ED DIAGNOSES  Final diagnoses:  Acute pain of right shoulder  Elevated troponin      NEW MEDICATIONS STARTED DURING THIS VISIT:  New Prescriptions   No medications on file     Note:  This document was  prepared using Dragon voice recognition software and may include unintentional dictation errors.    Arta Silence, MD 11/01/17 1148

## 2017-11-01 NOTE — ED Notes (Signed)
Report called to [place], RN and pt updated with plan of care.

## 2017-11-01 NOTE — Clinical Social Work Note (Signed)
CSW received verbal consult of possible self-neglect. The CSW will assess once PT has evaluated to determine discharge plan in light of changes in the patient's needs.  Santiago Bumpers, MSW, Latanya Presser [phone]

## 2017-11-01 NOTE — Progress Notes (Signed)
Upon arrival to unit, patient presenting incontinent with foul smelling left foot diabetic ulcer and what appears to be a pressure injury back on back of head. Daughter and son at bedside and daughter states from where patient lays head on pillow all the time at home. Per POA (son) patient lives and ambulates at home by hisself with walker but family transports patient to appointments. Upon removal of right prosthesis, roaches were noted to crawl out from under patient. Spoke with Santiago Glad CSW about patient and also care management consult was placed.

## 2017-11-01 NOTE — ED Notes (Signed)
MD [facility] aware of pt Trop 0.26

## 2017-11-01 NOTE — ED Notes (Signed)
Pt has diabetic ulcer and is scheduled to see Wound Care on Wednesday. Pt is SHOB when ambulating. Pt has a wound/sore on the right shoulder that has progressively gotten worse. Pt states he has taken tylenol but has no relief. Pt has an amputation below right knee, with a prosthetic leg. Family is at bedside.

## 2017-11-01 NOTE — Consult Note (Signed)
CARDIOLOGY CONSULT NOTE       Patient ID: STRAN RAPER MRN: [id number] DOB/AGE: 1931-02-06 84 y.o.  Admit date: 11/01/2017 Referring Physician: Tressia Miners Primary Physician: [name], MD Primary Cardiologist: Seen by Ubaldo Glassing in 2016 Reason for Consultation: Elevated troponin  Active Problems:   Elevated troponin   HPI:  84 y.o. lives alone. Has had severe nearly constant right shoulder pain for a week. Does not recall trauma He is  Right handed and has RBKA and now with severe pain and limited right shoulder motion he has had hard time doing ADL In 2016 he had atrial flutter. Seen by Dr Ubaldo Glassing and thought not to be a candidate for anti coagulation due to fall risk and history of duodenal ulcer requiring transfusion. We were asked to see him for elevated troponin .28 He has not had any chest pain palpitations mild exertional dyspnea and no syncope. CRF;s incldue DM, HLD, and HTN.  No history of CAD MI or CHF.  Currently going for MRI of right shoulder.    ROS All other systems reviewed and negative except as noted above  Past Medical History:  Diagnosis Date  . Anemia   . Diabetes mellitus without complication ([place])   . Hyperlipidemia   . Hypertension   . Skin cancer     Family History  Problem Relation Age of Onset  . Diabetes Mother   . Arthritis Sister   . Diabetes Daughter     Social History   Socioeconomic History  . Marital status: Married    Spouse name: Not on file  . Number of children: 5  . Years of education: 5th grade  . Highest education level: Not on file  Occupational History  . Occupation: Retired  Scientific laboratory technician  . Financial resource strain: Not on file  . Food insecurity:    Worry: Not on file    Inability: Not on file  . Transportation needs:    Medical: Not on file    Non-medical: Not on file  Tobacco Use  . Smoking status: Never Smoker  . Smokeless tobacco: Never Used  Substance and Sexual Activity  . Alcohol use: No  . Drug use:  No  . Sexual activity: Not on file  Lifestyle  . Physical activity:    Days per week: Not on file    Minutes per session: Not on file  . Stress: Not on file  Relationships  . Social connections:    Talks on phone: Not on file    Gets together: Not on file    Attends religious service: Not on file    Active member of club or organization: Not on file    Attends meetings of clubs or organizations: Not on file    Relationship status: Not on file  . Intimate partner violence:    Fear of current or ex partner: Not on file    Emotionally abused: Not on file    Physically abused: Not on file    Forced sexual activity: Not on file  Other Topics Concern  . Not on file  Social History Narrative   Lives at home by himself.  Ambulates with a walker   Has a  right prosthetic leg    Past Surgical History:  Procedure Laterality Date  . BASAL CELL CARCINOMA EXCISION    . History of eye surgery Bilateral   . MOHS SURGERY  04/2011   the top of his head  . Right BKA  09/12/2010  Dr. Delana Meyer; Secondary gangrenous right LE  . TONSILLECTOMY       . aspirin  81 mg Oral Daily  . atenolol  50 mg Oral Daily  . enoxaparin (LOVENOX) injection  40 mg Subcutaneous Q24H  . felodipine  2.5 mg Oral Daily  . ferrous sulfate  325 mg Oral Daily  . [START ON 11/02/2017] glipiZIDE  2.5 mg Oral QAC breakfast  . insulin aspart  0-5 Units Subcutaneous QHS  . insulin aspart  0-9 Units Subcutaneous TID WC  . loratadine  10 mg Oral Daily  . [START ON 11/02/2017] metFORMIN  500 mg Oral Q breakfast  . montelukast  10 mg Oral QHS  . pravastatin  40 mg Oral q1800  . [START ON 11/04/2017] Vitamin D (Ergocalciferol)  50,000 Units Oral Q7 days   . sodium chloride      Physical Exam: Blood pressure (!) 175/90, pulse 64, temperature 97.7 F (36.5 C), temperature source Oral, resp. rate 18, height 5\' 8"  (1.727 m), weight 238 lb (108 kg), SpO2 96 %.   Affect appropriate Elderly white male  HEENT: normal Neck supple  with no adenopathy JVP normal no bruits no thyromegaly Lungs clear with no wheezing and good diaphragmatic motion Heart:  S1/S2 SEM  murmur, no rub, gallop or click PMI normal Abdomen: benighn, BS positve, no tenderness, no AAA no bruit.  No HSM or HJR Right BKA  Neuro non-focal Skin warm and dry Pain to palpation over right shoulder with restricted motion    Labs:   Lab Results  Component Value Date   WBC 6.2 11/01/2017   HGB 10.7 (L) 11/01/2017   HCT 32.0 (L) 11/01/2017   MCV 93.8 11/01/2017   PLT 158 11/01/2017    Recent Labs  Lab 11/01/17 1042  NA 136  K 4.3  CL 103  CO2 25  BUN 40*  CREATININE 1.70*  CALCIUM 8.5*  PROT 7.3  BILITOT 0.6  ALKPHOS 110  ALT 17  AST 39  GLUCOSE 89   Lab Results  Component Value Date   TROPONINI 0.28 (HH) 11/01/2017    Lab Results  Component Value Date   CHOL 90 11/01/2017   CHOL 111 02/29/2016   CHOL 103 01/19/2015   Lab Results  Component Value Date   HDL 48 11/01/2017   HDL 44 02/29/2016   HDL 43 01/19/2015   Lab Results  Component Value Date   LDLCALC 31 11/01/2017   LDLCALC 52 02/29/2016   LDLCALC 36 01/19/2015   Lab Results  Component Value Date   TRIG 56 11/01/2017   TRIG 74 02/29/2016   TRIG 121 01/19/2015   Lab Results  Component Value Date   CHOLHDL 1.9 11/01/2017   CHOLHDL 2.5 02/29/2016   CHOLHDL 2.4 01/19/2015   No results found for: LDLDIRECT    Radiology: Dg Chest 2 View  Result Date: 11/01/2017 CLINICAL DATA:  Diffuse right shoulder pain without trauma. Shortness of breath. EXAM: CHEST - 2 VIEW COMPARISON:  Sep 30, 2016 FINDINGS: No pneumothorax. Small effusions, left greater than right, with underlying atelectasis. No nodules or masses. No focal infiltrates. No overt edema. The cardiomediastinal silhouette is stable. IMPRESSION: Small bilateral pleural effusions, left greater than right with underlying atelectasis. Electronically Signed   By: Dorise Bullion III M.D   On: 11/01/2017 10:18     Dg Shoulder Right  Result Date: 11/01/2017 CLINICAL DATA:  Pain. EXAM: RIGHT SHOULDER - 2+ VIEW COMPARISON:  None. FINDINGS: Mild AC joint degenerative changes. No  fracture dislocation. No cause for pain otherwise noted. IMPRESSION: Mild AC joint degenerative changes.  No other abnormalities. Electronically Signed   By: Dorise Bullion III M.D   On: 11/01/2017 10:19    EKG: SR LAD ICRBBB lateral T wave changes no significant change from ECG done 10/15/16   ASSESSMENT AND PLAN:   Elevated Troponin:  Doubt that muscular sounding right shoulder pain is anginal equivalent. Trend serial lab Results and ECG in am. TTE to assess EF and RWMAls.  If MRI clearly shows pathology I.e. Rotator cuff or bad bursitis , gouty arthritis would not pursue ischemic evaluation if no trend in troponin and TTE is normal  HTN:  Continue atenolol he actually appears dry and is azotemic consider mild hydration   HLD:  On statin   DM:  SS insulin per primary service   Signed: Jenkins Rouge 11/01/2017, 3:47 PM

## 2017-11-01 NOTE — ED Notes (Signed)
.   Pt is resting, Respirations even and unlabored, NAD. Stretcher lowest postion and locked. Call bell within reach. Denies any needs at this time RN will continue to monitor. Family at bedside at this time.

## 2017-11-01 NOTE — Consult Note (Addendum)
[facility] Nurse wound consult note Reason for Consult: posterior head (occiput PrI), purple buttocks that blanch (not a DTPI), Full thickness wound on left lateral foot with eschar Wound type:Pressure, neuropathic Pressure Injury POA: Yes Measurement: Left foot (lateral):  2.6cm x 7.5cm with depth unable to be determined due to the presence of necrotic tissue (eschar), loose, hanging. Posterior head (occiput): 2.5c round x 0.1cm red, moist with scant serosanguinous drainage on pillow case. Buttocks, no broken tissue, no DTPI, but cool to touch and blanching. Wound bed: As described above Drainage (amount, consistency, odor) As described above. Periwound: Left LE with scales and plaques consistent with chronic stasis dermatitis. Dressing procedure/placement/frequency: Patient was seen by dermatology on Friday of last week and was referred to outpatient [facility].  Appointment is not until 11/13/17 for left foot. Consultation with podiatry while in house is suggested.  If you agree, please order arrange/consult. In the meantime, I will implement collagenase (Santyl) dressing changes once daily, a Prevalon pressure redistribution heel boot for the left foot, topical care using a silicone foam to the occiput and a mattress replacement with low air loss as an over all preventive strategy.  This mattress will also address microclimate while in the bed.  A pressure redistribution chair pad is provided for OOB use.   [facility] nursing team will not follow, but will remain available to this patient, the nursing and medical teams.  Please re-consult if needed. Thanks, Maudie Flakes, MSN, RN, [place], Arther Abbott  Pager# [phone]

## 2017-11-01 NOTE — ED Notes (Signed)
Pt denies any chest pain at this time. Pt denies chest pain, as well as no chest apin when walking.

## 2017-11-01 NOTE — ED Notes (Signed)
Labs sent at this time.

## 2017-11-01 NOTE — Progress Notes (Signed)
PT Hold Note  Patient Details Name: Miguel Torres MRN: [id number] DOB: Oct 03, 1930   Evaluation Hold:    Reason Eval/Treat Not Completed: Medical issues which prohibited therapy. Order received and chart reviewed. Pt complaining of R shoulder pain. Troponins elevated at 0.26, currently denies chest pain. Cardiology consulted. Will hold PT evaluation until cleared by cardiology and troponins level off or trend down.  Lyndel Safe Huprich PT, DPT, G[facility]  Huprich,Jason 11/01/2017, 2:38 PM

## 2017-11-02 ENCOUNTER — Inpatient Hospital Stay (HOSPITAL_COMMUNITY)
Admit: 2017-11-02 | Discharge: 2017-11-02 | Disposition: A | Discharge status: Home / Self Care | Payer: Medicare Other | Attending: Internal Medicine | Admitting: Internal Medicine

## 2017-11-02 ENCOUNTER — Other Ambulatory Visit: Payer: Self-pay

## 2017-11-02 DIAGNOSIS — I482 Chronic atrial fibrillation: Secondary | ICD-10-CM

## 2017-11-02 DIAGNOSIS — I361 Nonrheumatic tricuspid (valve) insufficiency: Secondary | ICD-10-CM

## 2017-11-02 DIAGNOSIS — L8990 Pressure ulcer of unspecified site, unspecified stage: Secondary | ICD-10-CM

## 2017-11-02 LAB — GLUCOSE, CAPILLARY
GLUCOSE-CAPILLARY: 124 mg/dL — AB (ref 70–99)
GLUCOSE-CAPILLARY: 139 mg/dL — AB (ref 70–99)
Glucose-Capillary: 148 mg/dL — ABNORMAL HIGH (ref 70–99)
Glucose-Capillary: 134 mg/dL — ABNORMAL HIGH (ref 70–99)

## 2017-11-02 LAB — BASIC METABOLIC PANEL
ANION GAP: 6 (ref 5–15)
BUN: 39 mg/dL — ABNORMAL HIGH (ref 8–23)
CALCIUM: 8.1 mg/dL — AB (ref 8.9–10.3)
CO2: 25 mmol/L (ref 22–32)
Chloride: 104 mmol/L (ref 98–111)
Creatinine, Ser: 1.5 mg/dL — ABNORMAL HIGH (ref 0.61–1.24)
GFR calc Af Amer: 47 mL/min — ABNORMAL LOW (ref >60)
GFR, EST NON AFRICAN AMERICAN: 41 mL/min — AB (ref >60)
GLUCOSE: 198 mg/dL — AB (ref 70–99)
Potassium: 4.3 mmol/L (ref 3.5–5.1)
SODIUM: 135 mmol/L (ref 135–145)

## 2017-11-02 LAB — CBC
HCT: 31.3 % — ABNORMAL LOW (ref 40.0–52.0)
HEMOGLOBIN: 10.3 g/dL — AB (ref 13.0–18.0)
MCH: 30.8 pg (ref 26.0–34.0)
MCHC: 32.7 g/dL (ref 32.0–36.0)
MCV: 94.2 fL (ref 80.0–100.0)
Platelets: 144 10*3/uL — ABNORMAL LOW (ref 150–440)
RBC: 3.32 MIL/uL — ABNORMAL LOW (ref 4.40–5.90)
RDW: 14.4 % (ref 11.5–14.5)
WBC: 4.8 10*3/uL (ref 3.8–10.6)

## 2017-11-02 LAB — ECHOCARDIOGRAM COMPLETE
HEIGHTINCHES: 68 in
Weight: 3808 oz

## 2017-11-02 LAB — BRAIN NATRIURETIC PEPTIDE: B Natriuretic Peptide: 564 pg/mL — ABNORMAL HIGH (ref 0.0–100.0)

## 2017-11-02 LAB — HEMOGLOBIN A1C
Hgb A1c MFr Bld: 7.1 % — ABNORMAL HIGH (ref 4.8–5.6)
Mean Plasma Glucose: 157.07 mg/dL

## 2017-11-02 LAB — TROPONIN I: Troponin I: 0.22 ng/mL (ref <0.03)

## 2017-11-02 MED ORDER — MELOXICAM 7.5 MG PO TABS
7.5000 mg | ORAL_TABLET | Freq: Every day | ORAL | Status: DC
  Administered 2017-11-02 – 2017-11-04 (×3): 7.5 mg via ORAL
  Filled 2017-11-02 (×3): qty 1

## 2017-11-02 MED ORDER — APIXABAN 2.5 MG PO TABS
2.5000 mg | ORAL_TABLET | Freq: Two times a day (BID) | ORAL | Status: DC
  Administered 2017-11-02 – 2017-11-04 (×4): 2.5 mg via ORAL
  Filled 2017-11-02 (×4): qty 1

## 2017-11-02 MED ORDER — FUROSEMIDE 10 MG/ML IJ SOLN
20.0000 mg | Freq: Once | INTRAMUSCULAR | Status: AC
  Administered 2017-11-02: 20 mg via INTRAVENOUS
  Filled 2017-11-02: qty 2

## 2017-11-02 NOTE — Progress Notes (Signed)
Progress Note  Patient Name: Miguel Torres Date of Encounter: 11/02/2017  Primary Cardiologist: New to [facility]  Subjective  Notified the patient's heart rate was dropping into the upper 20s bpm, in Afib/flutter. Patient received atenolol 50 mg this morning (PTA atenolol 50 mg). He also received at least 50 mg of atenolol in the ED On 6/30. Echo pending. MRI of the shoulder shows MSK injury.   Asymptomatic during the above.   Inpatient Medications    Scheduled Meds: . aspirin  81 mg Oral Daily  . collagenase   Topical Daily  . enoxaparin (LOVENOX) injection  40 mg Subcutaneous Q24H  . felodipine  2.5 mg Oral Daily  . ferrous sulfate  325 mg Oral Daily  . glipiZIDE  2.5 mg Oral QAC breakfast  . insulin aspart  0-5 Units Subcutaneous QHS  . insulin aspart  0-9 Units Subcutaneous TID WC  . loratadine  10 mg Oral Daily  . metFORMIN  500 mg Oral Q breakfast  . montelukast  10 mg Oral QHS  . pravastatin  40 mg Oral q1800  . [START ON 11/04/2017] Vitamin D (Ergocalciferol)  50,000 Units Oral Q7 days   Continuous Infusions:  PRN Meds: acetaminophen **OR** acetaminophen, ondansetron **OR** ondansetron (ZOFRAN) IV, oxyCODONE, polyethylene glycol   Vital Signs    Vitals:   11/02/17 0854 11/02/17 1132 11/02/17 1140 11/02/17 1151  BP: (!) 167/83 (!) 135/56  (!) 129/46  Pulse: 70 (!) 35 (!) 34 (!) 42  Resp: 19     Temp:      TempSrc:      SpO2: 94% 90%    Weight:      Height:        Intake/Output Summary (Last 24 hours) at 11/02/2017 1207 Last data filed at 11/02/2017 1059 Gross per 24 hour  Intake 720 ml  Output 1450 ml  Net -730 ml   Filed Weights   11/01/17 0855  Weight: 238 lb (108 kg)    Telemetry    Afib/flutter with slow ventricular response, 50s to upper 20s bpm - Personally Reviewed  ECG    ordered - Personally Reviewed  Physical Exam   GEN: No acute distress.   Neck: No JVD. Cardiac: RRR, no murmurs, rubs, or gallops.  Respiratory: Clear to  auscultation bilaterally.  GI: Soft, nontender, non-distended.   MS: Status post right BKA with mild stump swelling, 1+ LLE pitting edema to the thigh Neuro:  Alert and oriented x 3; Nonfocal.  Psych: Normal affect.  Labs    Chemistry Recent Labs  Lab 11/01/17 1042 11/02/17 0144  NA 136 135  K 4.3 4.3  CL 103 104  CO2 25 25  GLUCOSE 89 198*  BUN 40* 39*  CREATININE 1.70* 1.50*  CALCIUM 8.5* 8.1*  PROT 7.3  --   ALBUMIN 3.0*  --   AST 39  --   ALT 17  --   ALKPHOS 110  --   BILITOT 0.6  --   GFRNONAA 35* 41*  GFRAA 41* 47*  ANIONGAP 8 6     Hematology Recent Labs  Lab 11/01/17 1042 11/02/17 0144  WBC 6.2 4.8  RBC 3.41* 3.32*  HGB 10.7* 10.3*  HCT 32.0* 31.3*  MCV 93.8 94.2  MCH 31.5 30.8  MCHC 33.6 32.7  RDW 14.4 14.4  PLT 158 144*    Cardiac Enzymes Recent Labs  Lab 11/01/17 1042 11/01/17 1433 11/01/17 2035 11/02/17 0144  TROPONINI 0.26* 0.28* 0.21* 0.22*   No  results for input(s): TROPIPOC in the last 168 hours.   BNPNo results for input(s): BNP, PROBNP in the last 168 hours.   DDimer No results for input(s): DDIMER in the last 168 hours.   Radiology    Dg Chest 2 View  Result Date: 11/01/2017 IMPRESSION: Small bilateral pleural effusions, left greater than right with underlying atelectasis. Electronically Signed   By: Dorise Bullion III M.D   On: 11/01/2017 10:18   Dg Shoulder Right  Result Date: 11/01/2017 IMPRESSION: Mild AC joint degenerative changes.  No other abnormalities. Electronically Signed   By: Dorise Bullion III M.D   On: 11/01/2017 10:19   Mr Shoulder Right Wo Contrast  Result Date: 11/01/2017 IMPRESSION: 1. Transverse edema at the base of the acromion probably representing fluid along a meta-acromial os acromiale with adjacent marrow edema and extensive surrounding soft tissue edema. This could alternatively be from a fracture, or an injury of the os acromiale. There is extensive edema in the posterior deltoid muscle and  mild edema in the trapezius and infraspinatus muscles. If the patient has signs of infection then the possibility of osteomyelitis and myositis might be considered. 2. Full-thickness supraspinatus tear, probably not full width, although the motion artifact makes this uncertain. Partial thickness articular surface tearing of the infraspinatus tendon. 3. Mild to moderate focal tendinopathy in the intra-articular segment of the long head of the biceps. 4. Trace glenohumeral joint effusion with moderate degenerative glenohumeral arthropathy. Electronically Signed   By: Van Clines M.D.   On: 11/01/2017 15:59    Cardiac Studies   TTE pending  Patient Profile     84 y.o. male with history of atrial flutter not on anticoagulation, PVD s/p right BKA,   Assessment & Plan    1. Atrial flutter/fib with slow ventricular response: -Has history of atrial flutter, not on anticoagulation -Afib/flutter with slow ventricular response noted this morning -Now appears to possibly be back in sinus rhythm, though it is quite difficult to tell on telemetry, will obtain an EKG to further evaluate rhythm -Heart rates now in the upper 50s bpm -No urgent need for venous temp wire or PPM -Received atenolol 50 mg this morning, now held -Would avoid atenolol moving forward given his age and renal function -Recent TSH normal -Potassium ok at 4.3 -Pads in place -Discussed with Dr. Fletcher Anon, BP is stable, continue to monitor patient for now and wash out atenolol -If needed, could use dopamine for augmentation of heart rate, though would like to avoid if possible -CHADS2VASc at least 7 (HTN, age x 2, DM, vascular disease, stroke x 2) -Has previously not been felt to be a good candidate for long term, full-dose anticoagulation given bleeding risk -He is somewhat confused this afternoon on rounds -We will need to talk with MD regarding possible anticoagulation  2. Elevated troponin: -Minimally elevated and flat  trending -Echo pending -If echo demonstrates a preserved LVSF and normal wall motion, no plans for inpatient ischemic evaluation at this time -Follow up as an outpatient, would benefit from outpatient stress testing  3. Shoulder pain: -MRI noted -Orthopedics consulted  4. CKD stage III: -Stable  5. PVD: -Status post right BKA  6. Anemia of chronic disease: -Stable  7. Lower extremity swelling: -Echo pending -Add on BNP -May benefit from gentle diuresis -Elevated -Compression wrap in place along the left leg  For questions or updates, please contact [place] Please consult [email or web address] for contact info under Cardiology/STEMI.    Signed, Christell Faith, PA-C  [place] Pager: [phone] 11/02/2017, 12:07 PM

## 2017-11-02 NOTE — NC FL2 (Signed)
[place] LEVEL OF CARE SCREENING TOOL     IDENTIFICATION  Patient Name: Miguel Torres Birthdate: [birth year] Sex: male Admission Date (Current Location): 11/01/2017  [place] and [place] Number:  Engineering geologist and Address:  [facility], [address], [place], [place] [address]      Provider Number: [id number]  Attending Physician Name and Address:  Henreitta Leber, MD  Relative Name and Phone Number:  [name] Daughter   [phone] or [place], [place]   [phone]     Current Level of Care: Hospital Recommended Level of Care: [place] Prior Approval Number:    Date Approved/Denied:   PASRR Number: [id number]  Discharge Plan: SNF    Current Diagnoses: Patient Active Problem List   Diagnosis Date Noted  . Pressure injury of skin 11/02/2017  . Elevated troponin 11/01/2017  . Microalbuminuria 10/15/2016  . Vitamin D deficiency 07/12/2015  . Retinopathy, diabetic, proliferative ([place]) 07/02/2015  . Bleeding duodenal ulcer 02/02/2015  . Atrial flutter ([place]) 01/19/2015  . Allergic rhinitis 01/17/2015  . Anemia of chronic disease 01/17/2015  . Chronic kidney disease (CKD), stage III (moderate) ([place]) 01/17/2015  . Diabetes mellitus with nephropathy ([place]) 01/17/2015  . Edema 01/17/2015  . Gout 01/17/2015  . Amputation of right lower extremity below knee upon examination ([place]) 01/17/2015  . HLD (hyperlipidemia) 01/17/2015  . Psoriasis 01/17/2015  . CA of skin 01/17/2015  . Hypertension 10/27/2014  . Mild cognitive disorder 12/05/2013  . Cancer of skin, squamous cell 01/21/2013  . Squamous cell carcinoma of scalp and skin of neck 03/03/2011    Orientation RESPIRATION BLADDER Height & Weight     Self  Normal Incontinent Weight: 242 lb 12.8 oz (110.1 kg) Height:  5\' 8"  (172.7 cm)  BEHAVIORAL SYMPTOMS/MOOD NEUROLOGICAL BOWEL NUTRITION STATUS      Continent Diet  AMBULATORY STATUS  COMMUNICATION OF NEEDS Skin     Verbally PU Stage and Appropriate Care, Skin abrasions   PU Stage 2 Dressing: (Every 3 days)                   Personal Care Assistance Level of Assistance  Bathing, Feeding, Dressing Bathing Assistance: Limited assistance Feeding assistance: Limited assistance Dressing Assistance: Limited assistance     Functional Limitations Info  Sight, Hearing, Speech Sight Info: Adequate   Speech Info: Adequate    SPECIAL CARE FACTORS FREQUENCY  PT (By licensed PT), OT (By licensed OT)     PT Frequency: 5x a week OT Frequency: 5x a week            Contractures Contractures Info: Not present    Additional Factors Info  Code Status, Allergies, Insulin Sliding Scale Code Status Info: Full Code Allergies Info: NKA   Insulin Sliding Scale Info: insulin aspart (novoLOG) injection 0-9 Units 3x a day with meals       Current Medications (11/02/2017):  This is the current hospital active medication list Current Facility-Administered Medications  Medication Dose Route Frequency Provider Last Rate Last Dose  . acetaminophen (TYLENOL) tablet 650 mg  650 mg Oral Q6H PRN Gladstone Lighter, MD       Or  . acetaminophen (TYLENOL) suppository 650 mg  650 mg Rectal Q6H PRN Gladstone Lighter, MD      . aspirin chewable tablet 81 mg  81 mg Oral Daily Gladstone Lighter, MD   81 mg at 11/02/17 0858  . collagenase (SANTYL) ointment   Topical Daily Gladstone Lighter, MD      .  enoxaparin (LOVENOX) injection 40 mg  40 mg Subcutaneous Q24H Gladstone Lighter, MD   40 mg at 11/01/17 2243  . felodipine (PLENDIL) 24 hr tablet 2.5 mg  2.5 mg Oral Daily Gladstone Lighter, MD   2.5 mg at 11/02/17 0859  . ferrous sulfate tablet 325 mg  325 mg Oral Daily Gladstone Lighter, MD   325 mg at 11/02/17 0858  . glipiZIDE (GLUCOTROL) tablet 2.5 mg  2.5 mg Oral QAC breakfast Gladstone Lighter, MD   2.5 mg at 11/02/17 0801  . insulin aspart (novoLOG) injection 0-5 Units  0-5  Units Subcutaneous QHS Gladstone Lighter, MD      . insulin aspart (novoLOG) injection 0-9 Units  0-9 Units Subcutaneous TID WC Gladstone Lighter, MD   1 Units at 11/02/17 1649  . loratadine (CLARITIN) tablet 10 mg  10 mg Oral Daily Gladstone Lighter, MD   10 mg at 11/02/17 0858  . meloxicam (MOBIC) tablet 7.5 mg  7.5 mg Oral Daily Henreitta Leber, MD   7.5 mg at 11/02/17 1613  . metFORMIN (GLUCOPHAGE-XR) 24 hr tablet 500 mg  500 mg Oral Q breakfast Gladstone Lighter, MD   500 mg at 11/02/17 0801  . montelukast (SINGULAIR) tablet 10 mg  10 mg Oral QHS Gladstone Lighter, MD   10 mg at 11/01/17 2243  . ondansetron (ZOFRAN) tablet 4 mg  4 mg Oral Q6H PRN Gladstone Lighter, MD       Or  . ondansetron (ZOFRAN) injection 4 mg  4 mg Intravenous Q6H PRN Gladstone Lighter, MD      . oxyCODONE (Oxy IR/ROXICODONE) immediate release tablet 5 mg  5 mg Oral Q4H PRN Gladstone Lighter, MD      . polyethylene glycol (MIRALAX / GLYCOLAX) packet 17 g  17 g Oral Daily PRN Gladstone Lighter, MD      . pravastatin (PRAVACHOL) tablet 40 mg  40 mg Oral q1800 Gladstone Lighter, MD   40 mg at 11/02/17 1708  . [START ON 11/04/2017] Vitamin D (Ergocalciferol) (DRISDOL) capsule 50,000 Units  50,000 Units Oral Q7 days Gladstone Lighter, MD         Discharge Medications: Please see discharge summary for a list of discharge medications.  Relevant Imaging Results:  Relevant Lab Results:   Additional Information SSN [id number]  Ross Ludwig, [place]

## 2017-11-02 NOTE — Evaluation (Signed)
Physical Therapy Evaluation Patient Details Name: Miguel Torres MRN: [id number] DOB: 1930/05/08 Today's Date: 11/02/2017   History of Present Illness  Pt is an 84 y.o. male presenting to hospital with atraumatic R shoulder pain x3 days.  Per pt he has been scratching R shoulder and pain flared up.  MRI R shoulder showing edema, full thickness supraspinatus tear, and partial thickness articular surface tearing of infraspinatus tendon.  Wounds noted posterior head and lateral L foot.  PMH includes HOH, mild cognitive disorder, skin CA, DM, htn, anemia, a-flutter, HLD, CKD, gout, R LE BKA 2012.  Clinical Impression  Prior to hospital admission, pt was ambulatory household distances with RW and R LE prosthesis (pt able to donn R LE prosthesis on own normally although pt's daughter assisted pt some with this during therapy session d/t R shoulder concerns).  Pt lives alone in 1 level home with ramp to enter.  Currently pt is SBA with bed mobility.  Pt cued not to use R UE during session (pt pending ortho consult for R shoulder) and pt unable to stand or transfer with only use of L UE and B LE's (with prosthesis donned) so pt assisted back into bed.  Pt would benefit from skilled PT to address noted impairments and functional limitations (see below for any additional details).  Will monitor pt's status and ortho recommendations and proceed as appropriate; currently recommend STR at discharge but will need to further assess discharge recommendations once any R UE restrictions are determined by ortho (MD, CM, and SW notified).    Follow Up Recommendations SNF    Equipment Recommendations  (TBD)    Recommendations for Other Services OT consult     Precautions / Restrictions Precautions Precautions: Fall Precaution Comments: HOH Restrictions Weight Bearing Restrictions: Yes Other Position/Activity Restrictions: R LE BKA (pt has prosthetic leg in room); post-op shoe L foot (wound L lateral foot);  prevalon boot L foot      Mobility  Bed Mobility Overal bed mobility: Needs Assistance Bed Mobility: Supine to Sit;Sit to Supine     Supine to sit: Supervision;HOB elevated Sit to supine: Supervision;HOB elevated   General bed mobility comments: increased effort to perform on own; vc's not to use R UE for safety  Transfers Overall transfer level: Needs assistance               General transfer comment: pt unable to stand or get bottom off of bed with only use of L UE and B LE's (pt cued not to use R UE for safety)  Ambulation/Gait             General Gait Details: deferred d/t unable to stand with only use of L UE (pt cued not to use R UE for safety)  Stairs            Wheelchair Mobility    Modified Rankin (Stroke Patients Only)       Balance Overall balance assessment: Needs assistance Sitting-balance support: No upper extremity supported;Feet supported Sitting balance-Leahy Scale: Good Sitting balance - Comments: steady sitting reaching within BOS with L UE                                     Pertinent Vitals/Pain Pain Assessment: 0-10 Pain Score: 0-No pain(0/10 pain at rest beginning/end of session; pt reporting increased pain with use/movement) Pain Location: R shoulder Pain Descriptors / Indicators: Sore Pain  Intervention(s): Limited activity within patient's tolerance;Monitored during session;Repositioned  HR 57-67 bpm during session; O2 WFL on room air    Home Living Family/patient expects to be discharged to:: Private residence Living Arrangements: Alone Available Help at Discharge: Family;Available PRN/intermittently Type of Home: House Home Access: Ramped entrance     Home Layout: One level Home Equipment: Walker - 2 wheels      Prior Function Level of Independence: Needs assistance   Gait / Transfers Assistance Needed: Modified independent with household ambulation with RW and R LE prosthesis  ADL's /  Homemaking Assistance Needed: Assist for groceries; driving to appts; has MOW  Comments: Pt reports no falls in past 6 months.  Children live nearby.     Hand Dominance        Extremity/Trunk Assessment   Upper Extremity Assessment Upper Extremity Assessment: (Pt performed AROM R shoulder flexion to 90 degrees (limited d/t R shoulder pain/soreness); L UE WFL)    Lower Extremity Assessment Lower Extremity Assessment: RLE deficits/detail;LLE deficits/detail RLE Deficits / Details: R LE BKA (at least 3/5 AROM hip flexion and knee flexion/extension) LLE Deficits / Details: at least 3/5 AROM hip flexion, knee flexion/extension, and DF    Cervical / Trunk Assessment Cervical / Trunk Assessment: Normal  Communication   Communication: HOH  Cognition Arousal/Alertness: Awake/alert Behavior During Therapy: WFL for tasks assessed/performed Overall Cognitive Status: Within Functional Limits for tasks assessed                                        General Comments General comments (skin integrity, edema, etc.): L LE dressing L foot to below L knee in place.  Nursing cleared pt for participation in physical therapy.  Pt agreeable to PT session.  Pt's daughter present during session.  MD Sainani cleared PT to work with pt (MD reporting no restrictions d/t troponin).    Exercises     Assessment/Plan    PT Assessment Patient needs continued PT services  PT Problem List Decreased strength;Decreased activity tolerance;Decreased balance;Decreased mobility;Pain       PT Treatment Interventions DME instruction;Gait training;Functional mobility training;Therapeutic activities;Therapeutic exercise;Balance training;Patient/family education    PT Goals (Current goals can be found in the Care Plan section)  Acute Rehab PT Goals Patient Stated Goal: to go home PT Goal Formulation: With patient Time For Goal Achievement: 11/16/17 Potential to Achieve Goals: Fair    Frequency  Min 2X/week   Barriers to discharge Other (comment) Level of assist    Co-evaluation               AM-PAC PT "6 Clicks" Daily Activity  Outcome Measure Difficulty turning over in bed (including adjusting bedclothes, sheets and blankets)?: A Little Difficulty moving from lying on back to sitting on the side of the bed? : A Little Difficulty sitting down on and standing up from a chair with arms (e.g., wheelchair, bedside commode, etc,.)?: Unable Help needed moving to and from a bed to chair (including a wheelchair)?: Total Help needed walking in hospital room?: Total Help needed climbing 3-5 steps with a railing? : Total 6 Click Score: 10    End of Session Equipment Utilized During Treatment: Gait belt Activity Tolerance: Patient tolerated treatment well Patient left: in bed;with call bell/phone within reach;with bed alarm set;with family/visitor present;Other (comment)(L LE elevated on pillow) Nurse Communication: Mobility status;Precautions PT Visit Diagnosis: Other abnormalities of gait and mobility (  R26.89);Muscle weakness (generalized) (M62.81);Difficulty in walking, not elsewhere classified (R26.2);Pain Pain - Right/Left: Right Pain - part of body: Shoulder    Time: 1025-1055 PT Time Calculation (min) (ACUTE ONLY): 30 min   Charges:   PT Evaluation $PT Eval Low Complexity: 1 Low PT Treatments $Therapeutic Activity: 8-22 mins   PT G CodesLeitha Bleak, PT 11/02/17, 11:26 AM [phone]

## 2017-11-02 NOTE — Progress Notes (Signed)
Troponin trend is stable at 0.22.  MRI of the shoulder showed MSK injury. Await echo.

## 2017-11-02 NOTE — Progress Notes (Signed)
Patient's foot wound dressing changed at this time, as ordered daily. Patient tolerated well. Appreciate the help of Keda, NT. Patient's wound was wrapped with Coban previously, however this time used the ordered ACE wrap. Also, patient's foot is now in a Prevalon boot, as ordered. Foot wasn't in Prevalon offload boot prior. Will continue to monitor wound dressing status for remainder of shift. Wenda Low [facility]

## 2017-11-02 NOTE — Consult Note (Signed)
ORTHOPAEDIC CONSULTATION  REQUESTING PHYSICIAN: Henreitta Leber, MD  Chief Complaint:   Right shoulder pain  History of Present Illness: History obtained with the assistance of his daughter, who was at the bedside, as patient has some difficulty hearing.  Miguel Torres is a 84 y.o. male with an approximately 5-day history of right shoulder pain.  There was no traumatic event.  He does state he had a boil on the lateral aspect of his shoulder that he scratched.  He has had pain since that point in time.  He has not had prior shoulder pain this severe prior to admission.  He currently rates his pain as a 4 out of 10 but was much more severe at the time of admission.  Pain is located over the lateral aspect of the shoulder.  Pain is described as a sharp pain with certain motions.  Pain is improved with rest.  His admission, he has obtained x-rays as well as an MRI with MRI showing a full-thickness rotator cuff tear as well as significant edema in his posterior rotator cuff and in his acromion suggesting possible minimally displaced fracture or os acromiale.  Of note, he has had 2 prior right shoulder surgeries to remove melanoma lesions.  This is currently in remission.  He also has a diabetic ulcer on his left foot.  He is being managed by the wound care team for this.  Past Medical History:  Diagnosis Date  . Anemia   . Diabetes mellitus without complication ([place])   . Hyperlipidemia   . Hypertension   . Skin cancer    Past Surgical History:  Procedure Laterality Date  . BASAL CELL CARCINOMA EXCISION    . History of eye surgery Bilateral   . MOHS SURGERY  04/2011   the top of his head  . Right BKA  09/12/2010   Dr. Delana Meyer; Secondary gangrenous right LE  . TONSILLECTOMY     Social History   Socioeconomic History  . Marital status: Married    Spouse name: Not on file  . Number of children: 5  . Years of  education: 5th grade  . Highest education level: Not on file  Occupational History  . Occupation: Retired  Scientific laboratory technician  . Financial resource strain: Not on file  . Food insecurity:    Worry: Not on file    Inability: Not on file  . Transportation needs:    Medical: Not on file    Non-medical: Not on file  Tobacco Use  . Smoking status: Never Smoker  . Smokeless tobacco: Never Used  Substance and Sexual Activity  . Alcohol use: No  . Drug use: No  . Sexual activity: Not on file  Lifestyle  . Physical activity:    Days per week: Not on file    Minutes per session: Not on file  . Stress: Not on file  Relationships  . Social connections:    Talks on phone: Not on file    Gets together: Not on file    Attends religious service: Not on file    Active member of club or organization: Not on file    Attends meetings of clubs or organizations: Not on file    Relationship status: Not on file  Other Topics Concern  . Not on file  Social History Narrative   Lives at home by himself.  Ambulates with a walker   Has a  right prosthetic leg   Family History  Problem Relation Age  of Onset  . Diabetes Mother   . Arthritis Sister   . Diabetes Daughter    No Known Allergies Prior to Admission medications   Medication Sig Start Date End Date Taking? Authorizing Provider  atenolol-chlorthalidone (TENORETIC) 50-25 MG tablet TAKE 1 TABLET BY MOUTH DAILY. 09/24/17  Yes [name], MD  felodipine (PLENDIL) 2.5 MG 24 hr tablet TAKE 1 TABLET BY MOUTH ONCE A DAY 07/27/17  Yes [name], MD  ferrous sulfate 325 (65 FE) MG tablet Take 1 tablet by mouth daily. 06/05/11  Yes [provider]  glipiZIDE (GLUCOTROL) 10 MG tablet TAKE 1 TABLET (10 MG TOTAL) BY MOUTH DAILY. 04/06/17  Yes [name], MD  loratadine (CLARITIN) 10 MG tablet Take 10 mg by mouth daily.   Yes [provider]  lovastatin (MEVACOR) 40 MG tablet TAKE 1 TABLET (40 MG TOTAL) BY MOUTH DAILY. 05/11/17   Yes [name], MD  metFORMIN (GLUCOPHAGE-XR) 500 MG 24 hr tablet TAKE 1 TABLET BY MOUTH EVERY DAY 09/16/16  Yes [name], MD  montelukast (SINGULAIR) 10 MG tablet TAKE 1 TABLET (10 MG TOTAL) BY MOUTH EVERY EVENING. 06/27/17  Yes [name], MD  pioglitazone (ACTOS) 30 MG tablet TAKE 1 TABLET (30 MG TOTAL) BY MOUTH DAILY. 10/11/16  Yes [name], MD  polyethylene glycol (MIRALAX / GLYCOLAX) packet Take 17 g by mouth daily as needed for mild constipation.  02/04/13  Yes [provider]  silver sulfADIAZINE (SILVADENE) 1 % cream Apply 1 application topically daily. 07/27/17  Yes [name], MD  Vitamin D, Ergocalciferol, (DRISDOL) 50000 units CAPS capsule TAKE ONE CAPSULE BY MOUTH EVERY 7 DAYS 10/21/17  Yes [name], MD  doxycycline (VIBRA-TABS) 100 MG tablet Take 1 tablet (100 mg total) by mouth 2 (two) times daily. Patient not taking: Reported on 11/01/2017 03/02/17   [name], MD   Recent Labs    11/01/17 1042 11/02/17 0144  WBC 6.2 4.8  HGB 10.7* 10.3*  HCT 32.0* 31.3*  PLT 158 144*  K 4.3 4.3  CL 103 104  CO2 25 25  BUN 40* 39*  CREATININE 1.70* 1.50*  GLUCOSE 89 198*  CALCIUM 8.5* 8.1*   Dg Chest 2 View  Result Date: 11/01/2017 CLINICAL DATA:  Diffuse right shoulder pain without trauma. Shortness of breath. EXAM: CHEST - 2 VIEW COMPARISON:  Sep 30, 2016 FINDINGS: No pneumothorax. Small effusions, left greater than right, with underlying atelectasis. No nodules or masses. No focal infiltrates. No overt edema. The cardiomediastinal silhouette is stable. IMPRESSION: Small bilateral pleural effusions, left greater than right with underlying atelectasis. Electronically Signed   By: Dorise Bullion III M.D   On: 11/01/2017 10:18   Dg Shoulder Right  Result Date: 11/01/2017 CLINICAL DATA:  Pain. EXAM: RIGHT SHOULDER - 2+ VIEW COMPARISON:  None. FINDINGS: Mild AC joint degenerative changes. No fracture dislocation. No cause for pain  otherwise noted. IMPRESSION: Mild AC joint degenerative changes.  No other abnormalities. Electronically Signed   By: Dorise Bullion III M.D   On: 11/01/2017 10:19   Mr Shoulder Right Wo Contrast  Result Date: 11/01/2017 CLINICAL DATA:  Right shoulder pain over the last few days EXAM: MRI OF THE RIGHT SHOULDER WITHOUT CONTRAST TECHNIQUE: Multiplanar, multisequence MR imaging of the shoulder was performed. No intravenous contrast was administered. COMPARISON:  None. FINDINGS: Despite efforts by the technologist and patient, severe motion artifact is present on today's exam and could not be  eliminated. This reduces exam sensitivity and specificity. Rotator cuff: Full-thickness tearing of the supraspinatus tendon is probably partial width, with some likely intact tendon posteriorly attaching to the humeral head, although this is not certain. Partial thickness articular surface tearing of the infraspinatus tendon. Subscapularis appears intact as does the teres minor. Muscles: Extensive edema in the posterior deltoid and tracking within along the infraspinatus muscle. Mild edema along the supraspinatus. Biceps long head: Mild to moderate tendinopathy of the intra-articular segment. Acromioclavicular Joint: Fracture versus meta-acromial os acromiale noted along the base of the acromion on image 7/3, with fluid within the synchondrosis/fracture plane, and surrounding edema. There is also fluid in the [facility] joint. Type I acromion. As expected there is some fluid in the subacromial subdeltoid bursa. Glenohumeral Joint: Loss of articular cartilage thickness along with spurring. Trace glenohumeral joint effusion. Labrum:  Indeterminate due to motion Bones:  No additional bony findings identified. Other: No supplemental non-categorized findings. IMPRESSION: 1. Transverse edema at the base of the acromion probably representing fluid along a meta-acromial os acromiale with adjacent marrow edema and extensive surrounding soft  tissue edema. This could alternatively be from a fracture, or an injury of the os acromiale. There is extensive edema in the posterior deltoid muscle and mild edema in the trapezius and infraspinatus muscles. If the patient has signs of infection then the possibility of osteomyelitis and myositis might be considered. 2. Full-thickness supraspinatus tear, probably not full width, although the motion artifact makes this uncertain. Partial thickness articular surface tearing of the infraspinatus tendon. 3. Mild to moderate focal tendinopathy in the intra-articular segment of the long head of the biceps. 4. Trace glenohumeral joint effusion with moderate degenerative glenohumeral arthropathy. Electronically Signed   By: Van Clines M.D.   On: 11/01/2017 15:59     Positive ROS: All other systems have been reviewed and were otherwise negative with the exception of those mentioned in the HPI and as above.  Physical Exam: BP (!) 129/46   Pulse (!) 42   Temp 98 F (36.7 C) (Oral)   Resp 19   Ht 5\' 8"  (1.727 m)   Wt 108 kg (238 lb)   SpO2 90%   BMI 36.19 kg/m  General:  Alert, no acute distress Psychiatric:  Patient is competent for consent with normal mood and affect   Cardiovascular:  No pedal edema, regular rate and rhythm Respiratory:  No wheezing, non-labored breathing GI:  Abdomen is soft and non-tender Skin:  Eschar formation over lesion that was scratched on shoulder with mild surrounding erythema. Multiple other lesions on head. Well-healed surgical scars x 2 about shoulder.  Neurologic:  Sensation intact distally, CN grossly intact Lymphatic:  No axillary or cervical lymphadenopathy  Orthopedic Exam:  RUE: +ain/pin/u motor SILT r/u/m/ax +rad pulse RoM: FF 160, ER 30 with arm at side, ER 80/IR 50 with arm abducted Minimal pain with RoM Mildly positive Neer's/Hawkins signs Mild TTP over acromion 4/5 supraspinatus and ER strength   X-rays/MRI:  As above: mild degenerative  changes, full-thickness rotator cuff tear, os acromiale  Assessment/Plan: 84 yo M w/R shoulder pain with mild degenerative changes, full-thickness rotator cuff tear, and os acromiale give lack of traumatic event to cause acromion fracture 1. Recommended non-operative management at this time given improvement in symptoms.  2. NSAIDs as tolerated with CKD. Would suggest meloxicam for lower side effects.  3. PT for R shoulder 4. Can follow up with me as an outpatient in ~2 weeks if he is having continued  pain. We could consider corticosteroid injection at that time.       Leim Fabry   11/02/2017 1:24 PM

## 2017-11-02 NOTE — Progress Notes (Signed)
*  PRELIMINARY RESULTS* Echocardiogram 2D Echocardiogram has been performed.  Sherrie Sport 11/02/2017, 9:44 AM

## 2017-11-02 NOTE — Care Management (Signed)
Patient presents from home with chest pain and pain in right shoulder.  Right bka with prosthesis. He lives alone.  Says his wife lives at [facility].  He has a walker and "I get around okay." Has two sons and a daughter that take to MD appointments and check on him.  He has meals on wheels.  He would be agreeable to consider short term skilled nursing facility placement if it was needed and if so, would want to go where his wife is staying.  Would much more prefer to go home and agreeable to home health services.  Would anticipate need for RN PT OT Aide and SW.  Patient has diabetic ulcer left foot with eschar.  It is documented that patient was being seen or to be seen at the wound care center.   Patient can not elaborate on this. There have been concerns addressed about patient's home environment (roaches were seen crawling out from under patient when right prosthesis removed.) It is reported that patient presenting with incontinence and concern for patient's hygiene.

## 2017-11-02 NOTE — Plan of Care (Signed)
  Problem: Skin Integrity: Goal: Risk for impaired skin integrity will decrease Outcome: Progressing   

## 2017-11-02 NOTE — Progress Notes (Addendum)
OT Cancellation Note  Patient Details Name: Miguel Torres MRN: [id number] DOB: March 17, 1931   Cancelled Treatment:    Reason Eval/Treat Not Completed: Medical issues which prohibited therapy. Order received, chart reviewed. Pt noted with HR in 30's, as low as 28 earlier. Per cardiology, pt with Afib/flutter with slow ventricular response this morning. EKG ordered by cardiology. Orthopedic surgery consult still pending. Will hold OT evaluation and re-attempt at later date/time as pt is medically appropriate and per updated POC per cardiology and orthopedic surgery.   Addendum 3:26pm: Per discussion with MD Posey Pronto, pt has no RUE weightbearing or other restrictions. PT notified.    Jeni Salles, MPH, MS, OTR/L ascom [phone] 11/02/17, 1:00 PM

## 2017-11-02 NOTE — Progress Notes (Signed)
Pt remained SR to [employer] throughout the evening. CCMD notified this Rn of pt's hr going down into the 30's, then pt returning to the 50's. Pt was also seen going down to 27, into room pt's hr up to 50's and 60's. Will continue to monitor and assess.

## 2017-11-02 NOTE — Plan of Care (Signed)
  Problem: Health Behavior/Discharge Planning: Goal: Ability to manage health-related needs will improve Outcome: Not Progressing Note:  H.R. very poorly controlled thus far this shift, with vent rates dipping into the upper 20's at times. H.R. currently is 2. Dips even lower when patient sleeping. Patient asymptomatic, however. Will continue to closely monitor H.R. Wenda Low [facility]

## 2017-11-02 NOTE — Progress Notes (Signed)
For the last 20 minutes or so his HR has been consistently in the 30s.  He's dropped as low as 28.  Defibrillator pads placed should he drop further.  Patient is awake, alert. No c/o pain.  Says he is weak but isn't different than usual.  Dr. Verdell Carmine notified. Told me to DC any beta blockers that patient is on.  Also instructed me to contact Cardiololgy.  Christell Faith, PA notified and will come to see the patient.

## 2017-11-02 NOTE — Progress Notes (Signed)
[place] [facility] [place] NAME: Miguel Torres    MR#:  [id number]  DATE OF BIRTH:  04/10/1931  SUBJECTIVE:   Patient here due to significant right shoulder pain and also noted to have an elevated troponin.  He denies any trauma to the right shoulder.  He denies any chest pains or shortness of breath.  Patient's daughter is at bedside.  he is also noted to be significantly bradycardic overnight.  REVIEW OF SYSTEMS:    Review of Systems  Constitutional: Negative for chills and fever.  HENT: Negative for congestion and tinnitus.   Eyes: Negative for blurred vision and double vision.  Respiratory: Negative for cough, shortness of breath and wheezing.   Cardiovascular: Negative for chest pain, orthopnea and PND.  Gastrointestinal: Negative for abdominal pain, diarrhea, nausea and vomiting.  Genitourinary: Negative for dysuria and hematuria.  Musculoskeletal: Positive for joint pain (Right Shoulder).  Neurological: Negative for dizziness, sensory change and focal weakness.  All other systems reviewed and are negative.   Nutrition: Carb modified/Heart Healthy Tolerating Diet: Yes Tolerating PT: Await Eval.    DRUG ALLERGIES:  No Known Allergies  VITALS:  Blood pressure (!) 129/46, pulse (!) 42, temperature 98 F (36.7 C), temperature source Oral, resp. rate 19, height 5\' 8"  (1.727 m), weight 108 kg (238 lb), SpO2 90 %.  PHYSICAL EXAMINATION:   Physical Exam  GENERAL:  84 y.o.-year-old patient lying in bed in no acute distress.  EYES: Pupils equal, round, reactive to light and accommodation. No scleral icterus. Extraocular muscles intact.  HEENT: Head atraumatic, normocephalic. Oropharynx and nasopharynx clear.  NECK:  Supple, no jugular venous distention. No thyroid enlargement, no tenderness.  LUNGS: Normal breath sounds bilaterally, no wheezing, rales, rhonchi. No use of accessory muscles of respiration.  CARDIOVASCULAR: S1, S2 normal.  No murmurs, rubs, or gallops.  ABDOMEN: Soft, nontender, nondistended. Bowel sounds present. No organomegaly or mass.  EXTREMITIES: No cyanosis, clubbing or edema b/l.  Right shoulder pain but no deformity noted.  Right BKA. NEUROLOGIC: Cranial nerves II through XII are intact. No focal Motor or sensory deficits b/l.   PSYCHIATRIC: The patient is alert and oriented x 3.  SKIN: No obvious rash, lesion, or ulcer.    LABORATORY PANEL:   CBC Recent Labs  Lab 11/02/17 0144  WBC 4.8  HGB 10.3*  HCT 31.3*  PLT 144*   ------------------------------------------------------------------------------------------------------------------  Chemistries  Recent Labs  Lab 11/01/17 1042 11/02/17 0144  NA 136 135  K 4.3 4.3  CL 103 104  CO2 25 25  GLUCOSE 89 198*  BUN 40* 39*  CREATININE 1.70* 1.50*  CALCIUM 8.5* 8.1*  AST 39  --   ALT 17  --   ALKPHOS 110  --   BILITOT 0.6  --    ------------------------------------------------------------------------------------------------------------------  Cardiac Enzymes Recent Labs  Lab 11/02/17 0144  TROPONINI 0.22*   ------------------------------------------------------------------------------------------------------------------  RADIOLOGY:  Dg Chest 2 View  Result Date: 11/01/2017 CLINICAL DATA:  Diffuse right shoulder pain without trauma. Shortness of breath. EXAM: CHEST - 2 VIEW COMPARISON:  Sep 30, 2016 FINDINGS: No pneumothorax. Small effusions, left greater than right, with underlying atelectasis. No nodules or masses. No focal infiltrates. No overt edema. The cardiomediastinal silhouette is stable. IMPRESSION: Small bilateral pleural effusions, left greater than right with underlying atelectasis. Electronically Signed   By: Dorise Bullion III M.D   On: 11/01/2017 10:18   Dg Shoulder Right  Result Date: 11/01/2017 CLINICAL DATA:  Pain. EXAM:  RIGHT SHOULDER - 2+ VIEW COMPARISON:  None. FINDINGS: Mild AC joint degenerative changes.  No fracture dislocation. No cause for pain otherwise noted. IMPRESSION: Mild AC joint degenerative changes.  No other abnormalities. Electronically Signed   By: Dorise Bullion III M.D   On: 11/01/2017 10:19   Mr Shoulder Right Wo Contrast  Result Date: 11/01/2017 CLINICAL DATA:  Right shoulder pain over the last few days EXAM: MRI OF THE RIGHT SHOULDER WITHOUT CONTRAST TECHNIQUE: Multiplanar, multisequence MR imaging of the shoulder was performed. No intravenous contrast was administered. COMPARISON:  None. FINDINGS: Despite efforts by the technologist and patient, severe motion artifact is present on today's exam and could not be eliminated. This reduces exam sensitivity and specificity. Rotator cuff: Full-thickness tearing of the supraspinatus tendon is probably partial width, with some likely intact tendon posteriorly attaching to the humeral head, although this is not certain. Partial thickness articular surface tearing of the infraspinatus tendon. Subscapularis appears intact as does the teres minor. Muscles: Extensive edema in the posterior deltoid and tracking within along the infraspinatus muscle. Mild edema along the supraspinatus. Biceps long head: Mild to moderate tendinopathy of the intra-articular segment. Acromioclavicular Joint: Fracture versus meta-acromial os acromiale noted along the base of the acromion on image 7/3, with fluid within the synchondrosis/fracture plane, and surrounding edema. There is also fluid in the [facility] joint. Type I acromion. As expected there is some fluid in the subacromial subdeltoid bursa. Glenohumeral Joint: Loss of articular cartilage thickness along with spurring. Trace glenohumeral joint effusion. Labrum:  Indeterminate due to motion Bones:  No additional bony findings identified. Other: No supplemental non-categorized findings. IMPRESSION: 1. Transverse edema at the base of the acromion probably representing fluid along a meta-acromial os acromiale with adjacent  marrow edema and extensive surrounding soft tissue edema. This could alternatively be from a fracture, or an injury of the os acromiale. There is extensive edema in the posterior deltoid muscle and mild edema in the trapezius and infraspinatus muscles. If the patient has signs of infection then the possibility of osteomyelitis and myositis might be considered. 2. Full-thickness supraspinatus tear, probably not full width, although the motion artifact makes this uncertain. Partial thickness articular surface tearing of the infraspinatus tendon. 3. Mild to moderate focal tendinopathy in the intra-articular segment of the long head of the biceps. 4. Trace glenohumeral joint effusion with moderate degenerative glenohumeral arthropathy. Electronically Signed   By: Van Clines M.D.   On: 11/01/2017 15:59     ASSESSMENT AND PLAN:   84 year old male with past medical history of hypertension, hyperlipidemia, diabetes, anemia of chronic disease, atrial fibrillation who presents to the hospital due to right shoulder pain and also incidentally noted to have an elevated troponin.  1.  Right shoulder pain-secondary to osteoarthritis and full-thickness rotator cuff tear, os acromiale.  -Continue supportive care with pain control.  Appreciate orthopedic evaluation and no need for urgent surgical intervention. -We will start some low-dose meloxicam, follow-up with them as an outpatient in 2 weeks and if not improving consider steroid injection.  Continue physical therapy as tolerated.  2.  Elevated troponin-likely in the setting of supply demand ischemia.  Troponins have remained flat. -Seen by cardiology, no acute intervention. -Await echocardiogram results.  3.  Bradycardia- patient had significant sinus bradycardia overnight.  He was although hemodynamically stable.  Patient has history of atrial fibrillation and is on atenolol.  Discontinue beta-blockers for now. - Follow heart rate and telemetry  overnight.  No acute intervention if needed consider  some low-dose dopamine.  4.  Diabetes type 2 without complication-continue glipizide, Metformin, sliding scale insulin coverage.  5.  Hyperlipidemia - cont. Pravachol.   All the records are reviewed and case discussed with Care Management/Social Worker. Management plans discussed with the patient, family and they are in agreement.  CODE STATUS: Full code  DVT Prophylaxis: Lovenox  TOTAL TIME TAKING CARE OF THIS PATIENT: 30 minutes.   POSSIBLE D/C IN 1-2 DAYS, DEPENDING ON CLINICAL CONDITION.   Henreitta Leber M.D on 11/02/2017 at 2:26 PM  Between 7am to 6pm - Pager - [phone]  After 6pm go to [email or web address] - Proofreader  [employer] [facility] [place] [facility]  Office  [phone]  CC: Primary care physician; [name], MD

## 2017-11-03 LAB — GLUCOSE, CAPILLARY
GLUCOSE-CAPILLARY: 121 mg/dL — AB (ref 70–99)
GLUCOSE-CAPILLARY: 113 mg/dL — AB (ref 70–99)
Glucose-Capillary: 93 mg/dL (ref 70–99)
Glucose-Capillary: 165 mg/dL — ABNORMAL HIGH (ref 70–99)

## 2017-11-03 MED ORDER — SODIUM CHLORIDE 0.9% FLUSH
3.0000 mL | Freq: Two times a day (BID) | INTRAVENOUS | Status: DC
  Administered 2017-11-03 – 2017-11-04 (×2): 3 mL via INTRAVENOUS

## 2017-11-03 NOTE — Clinical Social Work Note (Signed)
Clinical Social Work Assessment  Patient Details  Name: Miguel Torres MRN: [id number] Date of Birth: 1931-04-06  Date of referral:  11/03/17               Reason for consult:  Facility Placement                Permission sought to share information with:  Family Supports, Customer service manager Permission granted to share information::  Yes, Verbal Permission Granted  Name::     [place],Susan Daughter   [phone] or Lenorris, Karger [phone]  [phone] or  Momin, Misko   [phone]   Agency::  SNF admissions  Relationship::     Contact Information:     Housing/Transportation Living arrangements for the past 2 months:  Single Family Home Source of Information:  Patient, Adult Children Patient Interpreter Needed:  None Criminal Activity/Legal Involvement Pertinent to Current Situation/Hospitalization:  No - Comment as needed Significant Relationships:  Adult Children, Spouse Lives with:  Self Do you feel safe going back to the place where you live?  No Need for family participation in patient care:  No (Coment)  Care giving concerns: Patient and family feel he needs some short term rehab, and also has some possible self-neglect due to patient having roaches come out of his prosthesis, and patient was relatively unkept with body odor and long nails.   Social Worker assessment / plan:  Patient is an 84 year old male who is alert and oriented x4, patient is married and lives alone.  Patient's wife is currently a long term care resident at [facility], which she has been there for over a year.  It was reported that patient's living conditions are not very clean, and patient has some possible self-neglect.  CSW discussed with patient going to SNF for short term rehab, patient was explained process and what to expect at SNF.  Patient has been at SNF several years ago when he had his amputation, patient was explained how insurance will pay for stay.   Patient is agreeable to going to SNF and would like to go to [facility] because his wife is there as well.  Patient gave CSW permission to begin bed search in [place].  Employment status:  Retired Health visitor PT Recommendations:  Home with [employer], [place] / Referral to community resources:  [place]  Patient/Family's Response to care: Patient is in agreement to going to SNF for short term rehab. Patient/Family's Understanding of and Emotional Response to Diagnosis, Current Treatment, and Prognosis:  Patient expressed that he would like to go to rehab and work hard so he does not have to be at [facility] for very long.  Patient feels he will benefit from some intense rehab at [facility].  Emotional Assessment Appearance:  Appears stated age Attitude/Demeanor/Rapport:    Affect (typically observed):  Appropriate, Calm, Stable Orientation:  Oriented to Self, Oriented to Place, Oriented to  Time, Oriented to Situation Alcohol / Substance use:  Not Applicable Psych involvement (Current and /or in the community):  No (Comment)  Discharge Needs  Concerns to be addressed:  Lack of Support, Care Coordination Readmission within the last 30 days:  No Current discharge risk:  Lives alone, Lack of support system Barriers to Discharge:  Continued Medical Work up   Anell Barr 11/03/2017, 6:10 PM

## 2017-11-03 NOTE — Clinical Social Work Note (Signed)
CSW spoke to patient and his sons who were at bedside, patient would like to go to [facility] for some short term rehab.  Patient's wife is a current resident at [facility], [place] spoke to SNF and they can accept patient on Wednesday if he is medically ready for discharge and orders have been received.  CSW updated physician and bedside nurse.  Jones Broom. [place], MSW, [place]  11/03/2017 6:05 PM

## 2017-11-03 NOTE — Progress Notes (Signed)
Physical Therapy Treatment Patient Details Name: Miguel Torres MRN: [id number] DOB: 1930/06/03 Today's Date: 11/03/2017    History of Present Illness Pt is an 84 y.o. male who was admitted to [facility] with atraumatic R shoulder pain x3 days.  Per pt he has been scratching R shoulder and pain flared up.  MRI R shoulder showing edema, full thickness supraspinatus tear, and partial thickness articular surface tearing of infraspinatus tendon.  Wounds noted posterior head and lateral L foot.  PMH includes HOH, mild cognitive disorder, skin CA, DM, htn, anemia, a-flutter, HLD, CKD, gout, R LE BKA 2012.    PT Comments    Pt modified independent supine to sit; CGA with transfers; and CGA ambulating 50 feet with RW.  Pt steady throughout session with functional mobility using RW without any loss of balance.  HR 58-60 bpm at rest beginning of session and increased up to 78 bpm with activity.  No c/o R shoulder pain during session.  Will continue to progress pt with strengthening, balance, and progressive functional mobility per pt tolerance.  Discharge recommendations updated to HHPT (SW and CM notified).   Follow Up Recommendations  Home health PT     Equipment Recommendations  Rolling walker with 5" wheels    Recommendations for Other Services OT consult     Precautions / Restrictions Precautions Precautions: Fall Precaution Comments: HOH Restrictions Weight Bearing Restrictions: No Other Position/Activity Restrictions: R LE BKA (pt has prosthetic leg in room); post-op shoe L foot (wound L lateral foot); prevalon boot L foot    Mobility  Bed Mobility Overal bed mobility: Modified Independent Bed Mobility: Supine to Sit     Supine to sit: Modified independent (Device/Increase time);HOB elevated     General bed mobility comments: mild increased effort to perform on own but no physical assist required  Transfers Overall transfer level: Needs assistance Equipment used: Rolling walker  (2 wheeled) Transfers: Sit to/from [facility] Sit to Stand: Min guard Stand pivot transfers: Min guard(stand step turn bed to recliner)       General transfer comment: fairly strong stand up to RW; steady transfers with RW from bed and recliner  Ambulation/Gait Ambulation/Gait assistance: Min guard Gait Distance (Feet): 50 Feet Assistive device: Rolling walker (2 wheeled)   Gait velocity: decreased   General Gait Details: occasional vc's to stay closer to RW but pt steady throughout ambulation trial; decreased B step length   Stairs             Wheelchair Mobility    Modified Rankin (Stroke Patients Only)       Balance Overall balance assessment: Needs assistance Sitting-balance support: No upper extremity supported;Feet supported Sitting balance-Leahy Scale: Normal Sitting balance - Comments: steady sitting reaching outside BOS with L UE   Standing balance support: Bilateral upper extremity supported;During functional activity Standing balance-Leahy Scale: Poor Standing balance comment: pt steady ambulating with use of RW                            Cognition Arousal/Alertness: Awake/alert Behavior During Therapy: WFL for tasks assessed/performed Overall Cognitive Status: Within Functional Limits for tasks assessed                                        Exercises      General Comments General comments (skin integrity, edema, etc.): L  LE dressing L foot to below L knee in place; pt incontinent of urine (pt's wet gown changed; nurse and NT present end of session to give pt bath).  Nursing cleared pt for participation in physical therapy.  Pt agreeable to PT session.      Pertinent Vitals/Pain Pain Assessment: No/denies pain Pain Score: 5  Pain Location: R shoulder Pain Descriptors / Indicators: Sore Pain Intervention(s): Limited activity within patient's tolerance;Monitored during session;Repositioned  O2 91%  or greater on room air during session.    Home Living         Prior Function        PT Goals (current goals can now be found in the care plan section) Acute Rehab PT Goals Patient Stated Goal: to go home PT Goal Formulation: With patient Time For Goal Achievement: 11/16/17 Potential to Achieve Goals: Good Progress towards PT goals: Progressing toward goals    Frequency    Min 2X/week      PT Plan Discharge plan needs to be updated(SW and CM notified)    Co-evaluation              AM-PAC PT "6 Clicks" Daily Activity  Outcome Measure  Difficulty turning over in bed (including adjusting bedclothes, sheets and blankets)?: A Little Difficulty moving from lying on back to sitting on the side of the bed? : A Little Difficulty sitting down on and standing up from a chair with arms (e.g., wheelchair, bedside commode, etc,.)?: Unable Help needed moving to and from a bed to chair (including a wheelchair)?: A Little Help needed walking in hospital room?: A Little Help needed climbing 3-5 steps with a railing? : A Lot 6 Click Score: 15    End of Session Equipment Utilized During Treatment: Gait belt;Other (comment)(R LE prosthesis) Activity Tolerance: Patient tolerated treatment well Patient left: in chair;with call bell/phone within reach;with chair alarm set;with nursing/sitter in room Nurse Communication: Mobility status;Precautions PT Visit Diagnosis: Other abnormalities of gait and mobility (R26.89);Muscle weakness (generalized) (M62.81);Difficulty in walking, not elsewhere classified (R26.2);Pain Pain - Right/Left: Right Pain - part of body: Shoulder     Time: 4628-6381 PT Time Calculation (min) (ACUTE ONLY): 26 min  Charges:  $Gait Training: 8-22 mins $Therapeutic Activity: 8-22 mins                    G CodesLeitha Bleak, PT 11/03/17, 12:37 PM [phone]

## 2017-11-03 NOTE — Progress Notes (Signed)
[facility] [place] NAME: Miguel Torres    MR#:  [id number]  DATE OF BIRTH:  06/09/1930  SUBJECTIVE:   Right shoulder pain has improved.  Seen by orthopedics yesterday and continue supportive care.  No further bradycardia overnight.  REVIEW OF SYSTEMS:    Review of Systems  Constitutional: Negative for chills and fever.  HENT: Negative for congestion and tinnitus.   Eyes: Negative for blurred vision and double vision.  Respiratory: Negative for cough, shortness of breath and wheezing.   Cardiovascular: Negative for chest pain, orthopnea and PND.  Gastrointestinal: Negative for abdominal pain, diarrhea, nausea and vomiting.  Genitourinary: Negative for dysuria and hematuria.  Musculoskeletal: Positive for joint pain (Right Shoulder).  Neurological: Negative for dizziness, sensory change and focal weakness.  All other systems reviewed and are negative.   Nutrition: Carb modified/Heart Healthy Tolerating Diet: Yes Tolerating PT/OT - Eval noted.   DRUG ALLERGIES:  No Known Allergies  VITALS:  Blood pressure (!) 142/70, pulse 76, temperature 97.7 F (36.5 C), temperature source Oral, resp. rate 17, height 5\' 8"  (1.727 m), weight 110.1 kg (242 lb 12.8 oz), SpO2 97 %.  PHYSICAL EXAMINATION:   Physical Exam  GENERAL:  84 y.o.-year-old patient lying in bed in no acute distress.  EYES: Pupils equal, round, reactive to light and accommodation. No scleral icterus. Extraocular muscles intact.  HEENT: Head atraumatic, normocephalic. Oropharynx and nasopharynx clear.  NECK:  Supple, no jugular venous distention. No thyroid enlargement, no tenderness.  LUNGS: Normal breath sounds bilaterally, no wheezing, rales, rhonchi. No use of accessory muscles of respiration.  CARDIOVASCULAR: S1, S2 normal. No murmurs, rubs, or gallops.  ABDOMEN: Soft, nontender, nondistended. Bowel sounds present. No organomegaly or mass.  EXTREMITIES: No cyanosis,  clubbing or edema b/l.  Right shoulder pain but no deformity noted.  Right BKA. NEUROLOGIC: Cranial nerves II through XII are intact. No focal Motor or sensory deficits b/l.   PSYCHIATRIC: The patient is alert and oriented x 3.  SKIN: No obvious rash, lesion, or ulcer.    LABORATORY PANEL:   CBC Recent Labs  Lab 11/02/17 0144  WBC 4.8  HGB 10.3*  HCT 31.3*  PLT 144*   ------------------------------------------------------------------------------------------------------------------  Chemistries  Recent Labs  Lab 11/01/17 1042 11/02/17 0144  NA 136 135  K 4.3 4.3  CL 103 104  CO2 25 25  GLUCOSE 89 198*  BUN 40* 39*  CREATININE 1.70* 1.50*  CALCIUM 8.5* 8.1*  AST 39  --   ALT 17  --   ALKPHOS 110  --   BILITOT 0.6  --    ------------------------------------------------------------------------------------------------------------------  Cardiac Enzymes Recent Labs  Lab 11/02/17 0144  TROPONINI 0.22*   ------------------------------------------------------------------------------------------------------------------  RADIOLOGY:  No results found.   ASSESSMENT AND PLAN:   84 year old male with past medical history of hypertension, hyperlipidemia, diabetes, anemia of chronic disease, atrial fibrillation who presents to the hospital due to right shoulder pain and also incidentally noted to have an elevated troponin.  1.  Right shoulder pain-secondary to osteoarthritis and full-thickness rotator cuff tear, os acromiale.  -Continue supportive care with pain control.  Appreciate orthopedic evaluation and no need for urgent surgical intervention. - cont. Meloxicam, follow-up with them as an outpatient in 2 weeks and if not improving consider steroid injection.  Continue physical therapy as tolerated.  2.  Elevated troponin-likely in the setting of supply demand ischemia.  Troponins have remained flat. -Seen by cardiology, no acute intervention. -Echocardiogram showing  normal ejection fraction with no wall motion abnormalities.  3.  Bradycardia- patient had significant sinus bradycardia 2 nights ago.  Remains stable overnight.    -Hemodynamically stable.  Seen by cardiology no acute intervention.  Continue to hold atenolol for now.  4.  Diabetes type 2 without complication-continue glipizide, Metformin, sliding scale insulin coverage. - BS stable.   5.  Hyperlipidemia - cont. Pravachol.   OT recommending short-term rehab PT yesterday recommended rehab but recommending home health services today.  Patient is still quite weak and deconditioned and has poor living conditions.  Would benefit from being discharged to short-term rehab.  Social work to discuss with patient.   All the records are reviewed and case discussed with Care Management/Social Worker. Management plans discussed with the patient, family and they are in agreement.  CODE STATUS: Full code  DVT Prophylaxis: Lovenox  TOTAL TIME TAKING CARE OF THIS PATIENT: 30 minutes.   POSSIBLE D/C IN 1-2 DAYS, DEPENDING ON CLINICAL CONDITION.   Henreitta Leber M.D on 11/03/2017 at 3:59 PM  Between 7am to 6pm - Pager - [phone]  After 6pm go to [email or web address] - Proofreader  [facility]  Office  [phone]  CC: Primary care physician; [name], MD

## 2017-11-03 NOTE — Evaluation (Signed)
Occupational Therapy Evaluation Patient Details Name: Miguel Torres MRN: [id number] DOB: [birth year] Today's Date: 11/03/2017    History of Present Illness Pt is an 84 y.o. male who was admitted to [facility] with atraumatic R shoulder pain x3 days.  Per pt he has been scratching R shoulder and pain flared up.  MRI R shoulder showing edema, full thickness supraspinatus tear, and partial thickness articular surface tearing of infraspinatus tendon.  Wounds noted posterior head and lateral L foot.  PMH includes HOH, mild cognitive disorder, skin CA, DM, htn, anemia, a-flutter, HLD, CKD, gout, R LE BKA 2012.   Clinical Impression   First attempt for treatment, pt. Eating breakfast. Pt. participated in the initial visit on the 2nd attempt after finishing breakfast. Pt. presents with weakness, limited dominant RUE ROM, limited activity tolerance, 5/10 right shoulder pain,and limited functional mobility which limits his ability to complete basic ADL and IADL functioning. Ortho consult was completed by Dr. Posey Pronto, and cleared for therapy.Pt. Resides at home alone. Pt. Was independent with basic ADLs,and took sponge baths. Pt. Reports independence with medication management. Pt. Receives meals on wheels 5 days a week. Family assists with meal the other 2 days. Pt. Education was provided about ROM, ADL tasks, and A/E to assist with ADLs. Pt. Could benefit from OT services for ADL training, UE there. Ex. A/E training, and pt. education about home modification, and DME. Pt. would benefit from SNF level of care upon discharge. Pt. Could benefit from follow-up OT services at discharge.    Follow Up Recommendations  SNF    Equipment Recommendations    BSCommode   Recommendations for Other Services       Precautions / Restrictions Precautions Precautions: Fall Precaution Comments: HOH Restrictions Weight Bearing Restrictions: No Other Position/Activity Restrictions: R LE BKA (pt has prosthetic leg in room);  post-op shoe L foot (wound L lateral foot); prevalon boot L foot      Mobility Bed Mobility                  Transfers                 General transfer comment: Refer to PT    Balance                                           ADL either performed or assessed with clinical judgement   ADL Overall ADL's : Needs assistance/impaired Eating/Feeding: Set up;Independent;Sitting   Grooming: Set up;Sitting;Independent   Upper Body Bathing: Moderate assistance   Lower Body Bathing: Maximal assistance   Upper Body Dressing : Moderate assistance   Lower Body Dressing: Maximal assistance                 General ADL Comments: Pt. education was provided about A/E use for LE ADLs.     Vision Baseline Vision/History: Wears glasses Wears Glasses: At all times Patient Visual Report: No change from baseline       Perception     Praxis      Pertinent Vitals/Pain Pain Assessment: 0-10 Pain Score: 5  Pain Location: R shoulder Pain Descriptors / Indicators: Sore Pain Intervention(s): Limited activity within patient's tolerance;Monitored during session;Repositioned     Hand Dominance Right   Extremity/Trunk Assessment Upper Extremity Assessment Upper Extremity Assessment: Generalized weakness(Right shoulder AROM to 90. Limited by pain.)  Communication Communication Communication: HOH   Cognition Arousal/Alertness: Awake/alert Behavior During Therapy: WFL for tasks assessed/performed Overall Cognitive Status: Within Functional Limits for tasks assessed                                     General Comments       Exercises     Shoulder Instructions      Home Living Family/patient expects to be discharged to:: Private residence Living Arrangements: Alone Available Help at Discharge: Family;Available PRN/intermittently Type of Home: House Home Access: Ramped entrance     Home Layout: One level      Bathroom Shower/Tub: Tub/shower unit;Walk-in shower         Home Equipment: Walker - 2 wheels;Grab bars - tub/shower          Prior Functioning/Environment Level of Independence: Needs assistance    ADL's / Homemaking Assistance Needed: Pt. was able to complete ADL tasks independently. Pt. took spongebaths. Has meals wheels during the week, and dtr assist with meals on the weekends. Independent medication management            OT Problem List: Decreased strength;Decreased activity tolerance;Decreased knowledge of use of DME or AE;Pain;Decreased range of motion;Impaired UE functional use      OT Treatment/Interventions: Self-care/ADL training;Therapeutic exercise;Therapeutic activities;Patient/family education;Energy conservation;DME and/or AE instruction    OT Goals(Current goals can be found in the care plan section)    OT Frequency: Min 1X/week   Barriers to D/C:            Co-evaluation              AM-PAC PT "6 Clicks" Daily Activity     Outcome Measure Help from another person eating meals?: None Help from another person taking care of personal grooming?: A Little Help from another person toileting, which includes using toliet, bedpan, or urinal?: A Lot Help from another person bathing (including washing, rinsing, drying)?: A Lot   Help from another person to put on and taking off regular lower body clothing?: A Lot 6 Click Score: 13   End of Session Equipment Utilized During Treatment: Gait belt  Activity Tolerance: Patient tolerated treatment well;No increased pain Patient left: in bed;with call bell/phone within reach;with bed alarm set  OT Visit Diagnosis: Muscle weakness (generalized) (M62.81)                Time: 7078-6754 OT Time Calculation (min): 20 min Charges:  OT General Charges $OT Visit: 1 Visit OT Evaluation $OT Eval Moderate Complexity: 1 Mod G-Codes:     Harrel Carina, MS, OTR/L  Harrel Carina 11/03/2017, 11:12 AM

## 2017-11-04 ENCOUNTER — Telehealth: Payer: Self-pay

## 2017-11-04 DIAGNOSIS — E785 Hyperlipidemia, unspecified: Secondary | ICD-10-CM | POA: Diagnosis not present

## 2017-11-04 DIAGNOSIS — I4892 Unspecified atrial flutter: Secondary | ICD-10-CM | POA: Diagnosis not present

## 2017-11-04 DIAGNOSIS — E119 Type 2 diabetes mellitus without complications: Secondary | ICD-10-CM | POA: Diagnosis not present

## 2017-11-04 DIAGNOSIS — M1990 Unspecified osteoarthritis, unspecified site: Secondary | ICD-10-CM | POA: Diagnosis not present

## 2017-11-04 DIAGNOSIS — J918 Pleural effusion in other conditions classified elsewhere: Secondary | ICD-10-CM | POA: Diagnosis not present

## 2017-11-04 DIAGNOSIS — R001 Bradycardia, unspecified: Secondary | ICD-10-CM | POA: Diagnosis not present

## 2017-11-04 DIAGNOSIS — I482 Chronic atrial fibrillation: Secondary | ICD-10-CM | POA: Diagnosis not present

## 2017-11-04 DIAGNOSIS — M75121 Complete rotator cuff tear or rupture of right shoulder, not specified as traumatic: Secondary | ICD-10-CM | POA: Diagnosis not present

## 2017-11-04 DIAGNOSIS — R0902 Hypoxemia: Secondary | ICD-10-CM | POA: Diagnosis not present

## 2017-11-04 DIAGNOSIS — L97829 Non-pressure chronic ulcer of other part of left lower leg with unspecified severity: Secondary | ICD-10-CM | POA: Diagnosis not present

## 2017-11-04 DIAGNOSIS — R748 Abnormal levels of other serum enzymes: Secondary | ICD-10-CM | POA: Diagnosis not present

## 2017-11-04 DIAGNOSIS — L89894 Pressure ulcer of other site, stage 4: Secondary | ICD-10-CM | POA: Diagnosis not present

## 2017-11-04 DIAGNOSIS — Z89511 Acquired absence of right leg below knee: Secondary | ICD-10-CM | POA: Diagnosis not present

## 2017-11-04 DIAGNOSIS — L97522 Non-pressure chronic ulcer of other part of left foot with fat layer exposed: Secondary | ICD-10-CM | POA: Diagnosis not present

## 2017-11-04 DIAGNOSIS — I10 Essential (primary) hypertension: Secondary | ICD-10-CM | POA: Diagnosis not present

## 2017-11-04 DIAGNOSIS — R600 Localized edema: Secondary | ICD-10-CM | POA: Diagnosis not present

## 2017-11-04 DIAGNOSIS — M79672 Pain in left foot: Secondary | ICD-10-CM | POA: Diagnosis not present

## 2017-11-04 DIAGNOSIS — Z85828 Personal history of other malignant neoplasm of skin: Secondary | ICD-10-CM | POA: Diagnosis not present

## 2017-11-04 DIAGNOSIS — M25511 Pain in right shoulder: Secondary | ICD-10-CM | POA: Diagnosis not present

## 2017-11-04 DIAGNOSIS — Z7984 Long term (current) use of oral hypoglycemic drugs: Secondary | ICD-10-CM | POA: Diagnosis not present

## 2017-11-04 DIAGNOSIS — L97822 Non-pressure chronic ulcer of other part of left lower leg with fat layer exposed: Secondary | ICD-10-CM | POA: Diagnosis not present

## 2017-11-04 DIAGNOSIS — E11622 Type 2 diabetes mellitus with other skin ulcer: Secondary | ICD-10-CM | POA: Diagnosis not present

## 2017-11-04 DIAGNOSIS — L259 Unspecified contact dermatitis, unspecified cause: Secondary | ICD-10-CM | POA: Diagnosis not present

## 2017-11-04 DIAGNOSIS — R609 Edema, unspecified: Secondary | ICD-10-CM | POA: Diagnosis not present

## 2017-11-04 DIAGNOSIS — E559 Vitamin D deficiency, unspecified: Secondary | ICD-10-CM | POA: Diagnosis not present

## 2017-11-04 DIAGNOSIS — D649 Anemia, unspecified: Secondary | ICD-10-CM | POA: Diagnosis not present

## 2017-11-04 DIAGNOSIS — E1121 Type 2 diabetes mellitus with diabetic nephropathy: Secondary | ICD-10-CM | POA: Diagnosis not present

## 2017-11-04 LAB — GLUCOSE, CAPILLARY
GLUCOSE-CAPILLARY: 135 mg/dL — AB (ref 70–99)
Glucose-Capillary: 87 mg/dL (ref 70–99)

## 2017-11-04 MED ORDER — APIXABAN 2.5 MG PO TABS: 2.5000 mg | ORAL_TABLET | Freq: Two times a day (BID) | ORAL | Status: DC

## 2017-11-04 MED ORDER — FELODIPINE ER 5 MG PO TB24: 5.0000 mg | ORAL_TABLET | Freq: Every day | ORAL | Status: DC

## 2017-11-04 MED ORDER — MELOXICAM 7.5 MG PO TABS: 7.5000 mg | ORAL_TABLET | Freq: Every day | ORAL | Status: DC

## 2017-11-04 NOTE — Care Management Important Message (Signed)
Copy of signed IM left with patient in room.  

## 2017-11-04 NOTE — Plan of Care (Signed)
  Problem: Clinical Measurements: Goal: Ability to maintain clinical measurements within normal limits will improve Outcome: Progressing   Problem: Activity: Goal: Risk for activity intolerance will decrease Outcome: Progressing   Problem: Nutrition: Goal: Adequate nutrition will be maintained Outcome: Progressing   Problem: Safety: Goal: Ability to remain free from injury will improve Outcome: Progressing   

## 2017-11-04 NOTE — Discharge Summary (Signed)
[place] [facility] [place] NAME: Miguel Torres    MR#:  [id number]  DATE OF BIRTH:  [birth year]  DATE OF ADMISSION:  11/01/2017 ADMITTING PHYSICIAN: Gladstone Lighter, MD  DATE OF DISCHARGE: 11/04/2017  PRIMARY CARE PHYSICIAN: [name], MD    ADMISSION DIAGNOSIS:  Elevated troponin [R74.8] Acute pain of right shoulder [M25.511]  DISCHARGE DIAGNOSIS:  Active Problems:   Elevated troponin   Pressure injury of skin   SECONDARY DIAGNOSIS:   Past Medical History:  Diagnosis Date  . Anemia   . Diabetes mellitus without complication ([place])   . Hyperlipidemia   . Hypertension   . Skin cancer     HOSPITAL COURSE:   84 year old male with past medical history of hypertension, hyperlipidemia, diabetes, anemia of chronic disease, atrial fibrillation who presents to the hospital due to right shoulder pain and also incidentally noted to have an elevated troponin.  1.  Right shoulder pain-secondary to osteoarthritis and full-thickness rotator cuff tear, os acromiale.  -Patient underwent an MRI which showed the findings as mentioned above.  An orthopedic consult was obtained.  They did not recommend urgent surgical intervention but rather supportive care with pain control, physical and occupational therapy and follow-up with them as an outpatient. -Patient was started on some meloxicam.  His shoulder pain has improved since being hospitalized.  He is being discharged to a skilled nursing facility and will get PT/OT there and follow-up with orthopedics in the next 2 weeks.  2.  Elevated troponin- this was in the setting of supply demand ischemia.  Troponins remained flat. -Seen by cardiology, no acute intervention. -Echocardiogram showed normal ejection fraction with no wall motion abnormalities.  3.  Bradycardia- patient had significant sinus bradycardia in the hospital with heart rates going down in the mid 30s to low 40s.  Patient remained  hemodynamically stable. - Patient was on atenolol for rate control given his history of atrial fibrillation and that has been discontinued as per cardiology. -Patient's bradycardia has now resolved.   4.  Diabetes type 2 without complication- while in the hospital patient was maintained on sliding scale insulin. -Patient will resume his metformin, glipizide and Actos upon discharge.  5.  Hyperlipidemia - pt. willcont. Pravachol.   6.  Chronic atrial fibrillation- patient had episodes of bradycardia and therefore his atenolol was discontinued. Pt's CHADS score was 7 and cardiology discussed with patient and patient's daughter and they agreed on long-term anticoagulation and patient has been started on Eliquis. -Patient will follow up with cardiology as an outpatient.  7.  Essential hypertension- patient will continue his felodipine but the dose has been increased.   DISCHARGE CONDITIONS:   Stable  CONSULTS OBTAINED:  Treatment Team:  Leim Fabry, MD  DRUG ALLERGIES:  No Known Allergies  DISCHARGE MEDICATIONS:   Allergies as of 11/04/2017   No Known Allergies     Medication List    STOP taking these medications   atenolol-chlorthalidone 50-25 MG tablet Commonly known as:  TENORETIC   doxycycline 100 MG tablet Commonly known as:  VIBRA-TABS     TAKE these medications   apixaban 2.5 MG Tabs tablet Commonly known as:  ELIQUIS Take 1 tablet (2.5 mg total) by mouth 2 (two) times daily.   felodipine 5 MG 24 hr tablet Commonly known as:  PLENDIL Take 1 tablet (5 mg total) by mouth daily. What changed:    medication strength  how much to take   ferrous sulfate 325 (65  FE) MG tablet Take 1 tablet by mouth daily.   glipiZIDE 10 MG tablet Commonly known as:  GLUCOTROL TAKE 1 TABLET (10 MG TOTAL) BY MOUTH DAILY.   loratadine 10 MG tablet Commonly known as:  CLARITIN Take 10 mg by mouth daily.   lovastatin 40 MG tablet Commonly known as:  MEVACOR TAKE 1 TABLET (40  MG TOTAL) BY MOUTH DAILY.   meloxicam 7.5 MG tablet Commonly known as:  MOBIC Take 1 tablet (7.5 mg total) by mouth daily. Start taking on:  11/05/2017   metFORMIN 500 MG 24 hr tablet Commonly known as:  GLUCOPHAGE-XR TAKE 1 TABLET BY MOUTH EVERY DAY   montelukast 10 MG tablet Commonly known as:  SINGULAIR TAKE 1 TABLET (10 MG TOTAL) BY MOUTH EVERY EVENING.   pioglitazone 30 MG tablet Commonly known as:  ACTOS TAKE 1 TABLET (30 MG TOTAL) BY MOUTH DAILY.   polyethylene glycol packet Commonly known as:  MIRALAX / GLYCOLAX Take 17 g by mouth daily as needed for mild constipation.   silver sulfADIAZINE 1 % cream Commonly known as:  SILVADENE Apply 1 application topically daily.   Vitamin D (Ergocalciferol) 50000 units Caps capsule Commonly known as:  DRISDOL TAKE ONE CAPSULE BY MOUTH EVERY 7 DAYS         DISCHARGE INSTRUCTIONS:   DIET:  Cardiac diet and Diabetic diet  DISCHARGE CONDITION:  Stable  ACTIVITY:  Activity as tolerated  OXYGEN:  Home Oxygen: No.   Oxygen Delivery: room air  DISCHARGE LOCATION:  nursing home   If you experience worsening of your admission symptoms, develop shortness of breath, life threatening emergency, suicidal or homicidal thoughts you must seek medical attention immediately by calling 911 or calling your MD immediately  if symptoms less severe.  You Must read complete instructions/literature along with all the possible adverse reactions/side effects for all the Medicines you take and that have been prescribed to you. Take any new Medicines after you have completely understood and accpet all the possible adverse reactions/side effects.   Please note  You were cared for by a hospitalist during your hospital stay. If you have any questions about your discharge medications or the care you received while you were in the hospital after you are discharged, you can call the unit and asked to speak with the hospitalist on call if the  hospitalist that took care of you is not available. Once you are discharged, your primary care physician will handle any further medical issues. Please note that NO REFILLS for any discharge medications will be authorized once you are discharged, as it is imperative that you return to your primary care physician (or establish a relationship with a primary care physician if you do not have one) for your aftercare needs so that they can reassess your need for medications and monitor your lab values.     Today   No other acute events overnight, right shoulder pain has improved.  Had some slow heart rates into the 40s but was asymptomatic and quickly normalized by itself.  VITAL SIGNS:  Blood pressure (!) 158/82, pulse 76, temperature 97.8 F (36.6 C), temperature source Oral, resp. rate 18, height 5\' 8"  (1.727 m), weight 108.9 kg (240 lb 1.6 oz), SpO2 94 %.  I/O:    Intake/Output Summary (Last 24 hours) at 11/04/2017 1104 Last data filed at 11/04/2017 1038 Gross per 24 hour  Intake 960 ml  Output 1750 ml  Net -790 ml    PHYSICAL EXAMINATION:  GENERAL:  84 y.o.-year-old patient lying in bed in no acute distress.  EYES: Pupils equal, round, reactive to light and accommodation. No scleral icterus. Extraocular muscles intact.  HEENT: Head atraumatic, normocephalic. Oropharynx and nasopharynx clear.  NECK:  Supple, no jugular venous distention. No thyroid enlargement, no tenderness.  LUNGS: Normal breath sounds bilaterally, no wheezing, rales, rhonchi. No use of accessory muscles of respiration.  CARDIOVASCULAR: S1, S2 normal. No murmurs, rubs, or gallops.  ABDOMEN: Soft, nontender, nondistended. Bowel sounds present. No organomegaly or mass.  EXTREMITIES: No cyanosis, clubbing or edema b/l.  Right shoulder pain but no deformity noted.  Right BKA. NEUROLOGIC: Cranial nerves II through XII are intact. No focal Motor or sensory deficits b/l.   PSYCHIATRIC: The patient is alert and oriented x 3.   SKIN: No obvious rash, lesion, or ulcer.     DATA REVIEW:   CBC Recent Labs  Lab 11/02/17 0144  WBC 4.8  HGB 10.3*  HCT 31.3*  PLT 144*    Chemistries  Recent Labs  Lab 11/01/17 1042 11/02/17 0144  NA 136 135  K 4.3 4.3  CL 103 104  CO2 25 25  GLUCOSE 89 198*  BUN 40* 39*  CREATININE 1.70* 1.50*  CALCIUM 8.5* 8.1*  AST 39  --   ALT 17  --   ALKPHOS 110  --   BILITOT 0.6  --     Cardiac Enzymes Recent Labs  Lab 11/02/17 0144  TROPONINI 0.22*      RADIOLOGY:  No results found.    Management plans discussed with the patient, family and they are in agreement.  CODE STATUS:     Code Status Orders  (From admission, onward)        Start     Ordered   11/01/17 1358  Full code  Continuous     11/01/17 1357      TOTAL TIME TAKING CARE OF THIS PATIENT: 45 minutes.    Henreitta Leber M.D on 11/04/2017 at 11:04 AM  Between 7am to 6pm - Pager - [phone]  After 6pm go to [email or web address] - Proofreader  [facility]  Office  [phone]  CC: Primary care physician; [name], MD

## 2017-11-04 NOTE — Progress Notes (Signed)
Pt dc'd to [facility] via daughter personal vehicle. Pt has no complaints at this time and states he is feeling much better, report called to [facility], they are ready for his arrival.

## 2017-11-04 NOTE — Telephone Encounter (Signed)
Admitted

## 2017-11-04 NOTE — Telephone Encounter (Signed)
TCM....  Patient is being discharged   They saw Dr. Johnsie Cancel   They are scheduled to see R. Dunn on 8/19  They were seen for elevated troponin   They need to be seen within 2 weeks  Pt added to wait list   Please call

## 2017-11-04 NOTE — Clinical Social Work Note (Addendum)
Patient to be d/c'ed today to [facility].  Patient and family agreeable to plans will transport via daughter's car RN to call report to room 501, [phone].  Patient's daughter [name] was at bedside and is aware that patient is discharging.  Evette Cristal, MSW, [place]

## 2017-11-04 NOTE — Clinical Social Work Placement (Signed)
   CLINICAL SOCIAL WORK PLACEMENT  NOTE  Date:  11/04/2017  Patient Details  Name: Miguel Torres MRN: [id number] Date of Birth: 02-15-1931  Clinical Social Work is seeking post-discharge placement for this patient at the [place] level of care (*CSW will initial, date and re-position this form in  chart as items are completed):  Yes   Patient/family provided with [place] Work Department's list of facilities offering this level of care within the geographic area requested by the patient (or if unable, by the patient's family).  Yes   Patient/family informed of their freedom to choose among providers that offer the needed level of care, that participate in Medicare, Medicaid or managed care program needed by the patient, have an available bed and are willing to accept the patient.  Yes   Patient/family informed of [facility]'s ownership interest in [facility] and [facility], as well as of the fact that they are under no obligation to receive care at these facilities.  PASRR submitted to EDS on 11/02/17     PASRR number received on       Existing PASRR number confirmed on 11/02/17     FL2 transmitted to all facilities in geographic area requested by pt/family on       FL2 transmitted to all facilities within larger geographic area on       Patient informed that his/her managed care company has contracts with or will negotiate with certain facilities, including the following:        Yes   Patient/family informed of bed offers received.  Patient chooses bed at [facility]     Physician recommends and patient chooses bed at      Patient to be transferred to [facility] on 11/04/17.  Patient to be transferred to facility by Patient's daughter via car     Patient family notified on 11/04/17 of transfer.  Name of family member notified:  Patient's daughter Miguel Torres was at bedside     PHYSICIAN Please sign  FL2     Additional Comment:    _______________________________________________ Ross Ludwig, LCSWA 11/04/2017, 12:58 PM

## 2017-11-06 ENCOUNTER — Telehealth: Payer: Self-pay

## 2017-11-06 DIAGNOSIS — L89894 Pressure ulcer of other site, stage 4: Secondary | ICD-10-CM | POA: Diagnosis not present

## 2017-11-06 DIAGNOSIS — L97822 Non-pressure chronic ulcer of other part of left lower leg with fat layer exposed: Secondary | ICD-10-CM | POA: Diagnosis not present

## 2017-11-06 DIAGNOSIS — L259 Unspecified contact dermatitis, unspecified cause: Secondary | ICD-10-CM | POA: Diagnosis not present

## 2017-11-06 NOTE — Telephone Encounter (Signed)
Called pt twice and there was NANM. Called emergency contact [name]) and she stated pt was currently at [facility] and will be following up with PCP on 11/11/17. No TCM needed. FYI to PCP. -MM

## 2017-11-09 NOTE — Telephone Encounter (Signed)
No answer after several rings. Not able to leave a message.

## 2017-11-10 NOTE — Telephone Encounter (Signed)
No answer after several rings. No voicemail.

## 2017-11-11 ENCOUNTER — Inpatient Hospital Stay: Payer: Medicare Other | Admitting: Family Medicine

## 2017-11-12 NOTE — Telephone Encounter (Signed)
Patient contacted regarding discharge from [facility] on 11/04/17.  Patient understands to follow up with provider Christell Faith PA on 12/21/17 at 1:30 pm at [facility]. Patient understands discharge instructions? Yes Patient understands medications and regiment? Yes Patient understands to bring all medications to this visit? Yes  Spoke with patients son and he reports that his sister usually manages his care. Reviewed that numbers listed did not work or no answer. Also incomplete number for daughter. He verbalized understanding and states that he will let her know today that we called with appointment information. Instructed them to please give [place] a call back if they should have any further questions.

## 2017-11-13 ENCOUNTER — Ambulatory Visit: Payer: Self-pay | Admitting: Physician Assistant

## 2017-11-23 ENCOUNTER — Encounter (INDEPENDENT_AMBULATORY_CARE_PROVIDER_SITE_OTHER): Payer: Self-pay | Admitting: Vascular Surgery

## 2017-11-23 ENCOUNTER — Ambulatory Visit (INDEPENDENT_AMBULATORY_CARE_PROVIDER_SITE_OTHER): Payer: Medicare Other | Admitting: Vascular Surgery

## 2017-11-23 VITALS — BP 142/76 | HR 87 | Resp 18 | Ht 71.0 in | Wt 235.0 lb

## 2017-11-23 DIAGNOSIS — E1121 Type 2 diabetes mellitus with diabetic nephropathy: Secondary | ICD-10-CM

## 2017-11-23 DIAGNOSIS — R600 Localized edema: Secondary | ICD-10-CM | POA: Diagnosis not present

## 2017-11-23 DIAGNOSIS — L97522 Non-pressure chronic ulcer of other part of left foot with fat layer exposed: Secondary | ICD-10-CM

## 2017-11-23 DIAGNOSIS — E785 Hyperlipidemia, unspecified: Secondary | ICD-10-CM

## 2017-11-23 DIAGNOSIS — I890 Lymphedema, not elsewhere classified: Secondary | ICD-10-CM | POA: Insufficient documentation

## 2017-11-23 NOTE — Progress Notes (Signed)
Subjective:    Patient ID: Miguel Torres, male    DOB: Mar 09, 1931, 84 y.o.   MRN: [id number] Chief Complaint  Patient presents with  . New Patient (Initial Visit)    Left Leg Swelling   Presents as a new patient referred by Dr. Ouida Sills for evaluation of left lower extremity edema.  The patient was seen with his daughter.  The patient has been experiencing intermittent swelling to the left lower extremity for "years".  The patient recently developed an ulceration to the lateral aspect of his left foot which has been treated by the wound doctor at his nursing facility.  At this time, the patient does not engage in any type of conservative therapy including wearing medical grade one compression socks, elevating his legs or remaining active.  The patient is status post a right below the knee amputation.  The patient notes that his swelling worsens throughout the day.  The patient's swelling is associated with some discomfort.  The patient does not do a lot of walking and does spend large amounts of time sitting in his wheelchair in a dependent position.  Patient denies any recent surgery or trauma to the left lower extremity.  Patient denies any DVT history of the left lower extremity.  Patient denies any fever, nausea vomiting.  Review of Systems  Constitutional: Negative.   HENT: Negative.   Eyes: Negative.   Respiratory: Negative.   Cardiovascular: Positive for leg swelling.  Gastrointestinal: Negative.   Endocrine: Negative.   Genitourinary: Negative.   Musculoskeletal: Negative.   Skin: Positive for wound.  Allergic/Immunologic: Negative.   Neurological: Negative.   Hematological: Negative.   Psychiatric/Behavioral: Negative.       Objective:   Physical Exam  Constitutional: He is oriented to person, place, and time. He appears well-developed and well-nourished. No distress.  HENT:  Head: Normocephalic and atraumatic.  Right Ear: External ear normal.  Left Ear: External ear  normal.  Eyes: Pupils are equal, round, and reactive to light. Conjunctivae are normal.  Neck: Normal range of motion.  Cardiovascular: Normal rate, regular rhythm, normal heart sounds and intact distal pulses.  Pulses:      Radial pulses are 2+ on the right side, and 2+ on the left side.  Right BKA Left lower extremity: Hard to palpate pedal pulses due to body habitus and edema however the left foot is warm  Pulmonary/Chest: Effort normal and breath sounds normal.  Musculoskeletal: Normal range of motion. He exhibits edema (Moderate 2+ pitting edema to the left lower extremity).  Neurological: He is alert and oriented to person, place, and time.  Skin: He is not diaphoretic.  Left foot: Wound noted to the lateral aspect of the left foot.  Fat layer exposed.  No necrotic tissue.  100% fibrinous exudate to the wound bed.  Clear drainage.  No cellulitis or erythema surrounding the wound.  Psychiatric: He has a normal mood and affect. His behavior is normal. Judgment and thought content normal.  Vitals reviewed.  BP (!) 142/76 (BP Location: Right Arm, Patient Position: Sitting)   Pulse 87   Resp 18   Ht 5\' 11"  (1.803 m)   Wt 235 lb (106.6 kg)   BMI 32.78 kg/m   Past Medical History:  Diagnosis Date  . Anemia   . Diabetes mellitus without complication ([place])   . Hyperlipidemia   . Hypertension   . Skin cancer    Social History   Socioeconomic History  . Marital status: Married  Spouse name: Not on file  . Number of children: 5  . Years of education: 5th grade  . Highest education level: Not on file  Occupational History  . Occupation: Retired  Scientific laboratory technician  . Financial resource strain: Not on file  . Food insecurity:    Worry: Not on file    Inability: Not on file  . Transportation needs:    Medical: Not on file    Non-medical: Not on file  Tobacco Use  . Smoking status: Never Smoker  . Smokeless tobacco: Never Used  Substance and Sexual Activity  . Alcohol use: No    . Drug use: No  . Sexual activity: Not on file  Lifestyle  . Physical activity:    Days per week: Not on file    Minutes per session: Not on file  . Stress: Not on file  Relationships  . Social connections:    Talks on phone: Not on file    Gets together: Not on file    Attends religious service: Not on file    Active member of club or organization: Not on file    Attends meetings of clubs or organizations: Not on file    Relationship status: Not on file  . Intimate partner violence:    Fear of current or ex partner: Not on file    Emotionally abused: Not on file    Physically abused: Not on file    Forced sexual activity: Not on file  Other Topics Concern  . Not on file  Social History Narrative   Lives at home by himself.  Ambulates with a walker   Has a  right prosthetic leg   Past Surgical History:  Procedure Laterality Date  . BASAL CELL CARCINOMA EXCISION    . History of eye surgery Bilateral   . MOHS SURGERY  04/2011   the top of his head  . Right BKA  09/12/2010   Dr. Delana Meyer; Secondary gangrenous right LE  . TONSILLECTOMY     Family History  Problem Relation Age of Onset  . Diabetes Mother   . Arthritis Sister   . Diabetes Daughter    No Known Allergies     Assessment & Plan:  Presents as a new patient referred by Dr. Ouida Sills for evaluation of left lower extremity edema.  The patient was seen with his daughter.  The patient has been experiencing intermittent swelling to the left lower extremity for "years".  The patient recently developed an ulceration to the lateral aspect of his left foot which has been treated by the wound doctor at his nursing facility.  At this time, the patient does not engage in any type of conservative therapy including wearing medical grade one compression socks, elevating his legs or remaining active.  The patient is status post a right below the knee amputation.  The patient notes that his swelling worsens throughout the day.  The  patient's swelling is associated with some discomfort.  The patient does not do a lot of walking and does spend large amounts of time sitting in his wheelchair in a dependent position.  Patient denies any recent surgery or trauma to the left lower extremity.  Patient denies any DVT history of the left lower extremity.  Patient denies any fever, nausea vomiting.  1. Edema of left lower extremity - New In an attempt to gain control the patient's edema and assist in wound healing we will place a 3 layer zinc oxide Unna boot to  the left lower extremity changed weekly for approximately 1 month The patient was encouraged to elevate his leg heart level or higher as much as possible.  He was encouraged to elevate his leg during the day Once the edema is controlled I will transition him into medical grade 1 compression socks The patient is to come back in approximately 1 month and undergo a bilateral lower extremity venous duplex to rule out any contributing venous versus lymphatic disease - VAS [place] LOWER EXTREMITY VENOUS REFLUX; Future  2. Foot ulceration, left, with fat layer exposed ([place]) - New Patient with multiple risk factors for peripheral artery disease Slow healing ulceration to the lateral aspect of the left foot which is being complicated by the patient's edema Patient is being followed by the wound care specialist at his nursing facility I will have the patient come back and undergo an ABI to assess for any contributing peripheral artery disease  - VAS [place] ABI WITH/WO TBI; Future  3. Diabetes mellitus with nephropathy ([place]) - Stable Encouraged good control as its slows the progression of atherosclerotic disease  4. Hyperlipidemia, unspecified hyperlipidemia type - Stable Encouraged good control as its slows the progression of atherosclerotic disease  Current Outpatient Medications on File Prior to Visit  Medication Sig Dispense Refill  . apixaban (ELIQUIS) 2.5 MG TABS tablet Take 1 tablet  (2.5 mg total) by mouth 2 (two) times daily. 60 tablet   . ascorbic acid (VITAMIN C) 250 MG tablet Take 250 mg by mouth daily.    . felodipine (PLENDIL) 5 MG 24 hr tablet Take 1 tablet (5 mg total) by mouth daily.    . ferrous sulfate 325 (65 FE) MG tablet Take 1 tablet by mouth daily.    . furosemide (LASIX) 20 MG tablet Take 20 mg by mouth 2 (two) times daily.    [name] glipiZIDE (GLUCOTROL) 10 MG tablet TAKE 1 TABLET (10 MG TOTAL) BY MOUTH DAILY. 90 tablet 4  . loratadine (CLARITIN) 10 MG tablet Take 10 mg by mouth daily.    [name] lovastatin (MEVACOR) 40 MG tablet TAKE 1 TABLET (40 MG TOTAL) BY MOUTH DAILY. 90 tablet 3  . meloxicam (MOBIC) 7.5 MG tablet Take 1 tablet (7.5 mg total) by mouth daily.    . metFORMIN (GLUCOPHAGE-XR) 500 MG 24 hr tablet TAKE 1 TABLET BY MOUTH EVERY DAY 90 tablet 4  . metolazone (ZAROXOLYN) 5 MG tablet Take 5 mg by mouth daily.    . montelukast (SINGULAIR) 10 MG tablet TAKE 1 TABLET (10 MG TOTAL) BY MOUTH EVERY EVENING. 90 tablet 4  . pantoprazole (PROTONIX) 40 MG tablet Take 40 mg by mouth daily.    . pioglitazone (ACTOS) 30 MG tablet TAKE 1 TABLET (30 MG TOTAL) BY MOUTH DAILY. 90 tablet 4  . polyethylene glycol (MIRALAX / GLYCOLAX) packet Take 17 g by mouth daily as needed for mild constipation.     . silver sulfADIAZINE (SILVADENE) 1 % cream Apply 1 application topically daily. 50 g 4  . Vitamin D, Ergocalciferol, (DRISDOL) 50000 units CAPS capsule TAKE ONE CAPSULE BY MOUTH EVERY 7 DAYS 4 capsule 0  . zinc sulfate 220 (50 Zn) MG capsule Take 220 mg by mouth daily.     No current facility-administered medications on file prior to visit.    There are no Patient Instructions on file for this visit. No follow-ups on file.  Sanaz Scarlett A Mandell Pangborn, PA-C

## 2017-12-02 ENCOUNTER — Other Ambulatory Visit: Payer: Self-pay | Admitting: Family Medicine

## 2017-12-07 ENCOUNTER — Emergency Department: Payer: Medicare Other

## 2017-12-07 ENCOUNTER — Encounter: Payer: Self-pay | Admitting: Emergency Medicine

## 2017-12-07 ENCOUNTER — Observation Stay
Admission: EM | Admit: 2017-12-07 | Discharge: 2017-12-09 | Disposition: A | Discharge status: Home / Self Care | Payer: Medicare Other | Attending: Internal Medicine | Admitting: Internal Medicine

## 2017-12-07 ENCOUNTER — Other Ambulatory Visit: Payer: Self-pay

## 2017-12-07 DIAGNOSIS — E1122 Type 2 diabetes mellitus with diabetic chronic kidney disease: Secondary | ICD-10-CM | POA: Diagnosis not present

## 2017-12-07 DIAGNOSIS — I130 Hypertensive heart and chronic kidney disease with heart failure and stage 1 through stage 4 chronic kidney disease, or unspecified chronic kidney disease: Secondary | ICD-10-CM | POA: Diagnosis not present

## 2017-12-07 DIAGNOSIS — N183 Chronic kidney disease, stage 3 (moderate): Secondary | ICD-10-CM | POA: Diagnosis not present

## 2017-12-07 DIAGNOSIS — Z7984 Long term (current) use of oral hypoglycemic drugs: Secondary | ICD-10-CM | POA: Insufficient documentation

## 2017-12-07 DIAGNOSIS — I5032 Chronic diastolic (congestive) heart failure: Secondary | ICD-10-CM | POA: Insufficient documentation

## 2017-12-07 DIAGNOSIS — R531 Weakness: Secondary | ICD-10-CM | POA: Insufficient documentation

## 2017-12-07 DIAGNOSIS — E113599 Type 2 diabetes mellitus with proliferative diabetic retinopathy without macular edema, unspecified eye: Secondary | ICD-10-CM | POA: Diagnosis not present

## 2017-12-07 DIAGNOSIS — Z85828 Personal history of other malignant neoplasm of skin: Secondary | ICD-10-CM | POA: Insufficient documentation

## 2017-12-07 DIAGNOSIS — L97529 Non-pressure chronic ulcer of other part of left foot with unspecified severity: Secondary | ICD-10-CM | POA: Insufficient documentation

## 2017-12-07 DIAGNOSIS — E11621 Type 2 diabetes mellitus with foot ulcer: Secondary | ICD-10-CM | POA: Diagnosis not present

## 2017-12-07 DIAGNOSIS — Z6834 Body mass index (BMI) 34.0-34.9, adult: Secondary | ICD-10-CM | POA: Diagnosis not present

## 2017-12-07 DIAGNOSIS — N179 Acute kidney failure, unspecified: Principal | ICD-10-CM | POA: Diagnosis present

## 2017-12-07 DIAGNOSIS — E785 Hyperlipidemia, unspecified: Secondary | ICD-10-CM | POA: Insufficient documentation

## 2017-12-07 DIAGNOSIS — Z79899 Other long term (current) drug therapy: Secondary | ICD-10-CM | POA: Insufficient documentation

## 2017-12-07 DIAGNOSIS — Z7901 Long term (current) use of anticoagulants: Secondary | ICD-10-CM | POA: Insufficient documentation

## 2017-12-07 DIAGNOSIS — Z89511 Acquired absence of right leg below knee: Secondary | ICD-10-CM | POA: Insufficient documentation

## 2017-12-07 DIAGNOSIS — R001 Bradycardia, unspecified: Secondary | ICD-10-CM | POA: Insufficient documentation

## 2017-12-07 DIAGNOSIS — I10 Essential (primary) hypertension: Secondary | ICD-10-CM | POA: Diagnosis not present

## 2017-12-07 DIAGNOSIS — R630 Anorexia: Secondary | ICD-10-CM | POA: Diagnosis not present

## 2017-12-07 DIAGNOSIS — R748 Abnormal levels of other serum enzymes: Secondary | ICD-10-CM | POA: Insufficient documentation

## 2017-12-07 DIAGNOSIS — I482 Chronic atrial fibrillation: Secondary | ICD-10-CM | POA: Insufficient documentation

## 2017-12-07 DIAGNOSIS — Z791 Long term (current) use of non-steroidal anti-inflammatories (NSAID): Secondary | ICD-10-CM | POA: Diagnosis not present

## 2017-12-07 LAB — COMPREHENSIVE METABOLIC PANEL
ALK PHOS: 106 U/L (ref 38–126)
ALT: 15 U/L (ref 0–44)
AST: 43 U/L — AB (ref 15–41)
Albumin: 3.1 g/dL — ABNORMAL LOW (ref 3.5–5.0)
Anion gap: 13 (ref 5–15)
BILIRUBIN TOTAL: 1 mg/dL (ref 0.3–1.2)
BUN: 64 mg/dL — AB (ref 8–23)
CALCIUM: 8.3 mg/dL — AB (ref 8.9–10.3)
CHLORIDE: 99 mmol/L (ref 98–111)
CO2: 27 mmol/L (ref 22–32)
CREATININE: 2.57 mg/dL — AB (ref 0.61–1.24)
GFR, EST AFRICAN AMERICAN: 25 mL/min — AB (ref >60)
GFR, EST NON AFRICAN AMERICAN: 21 mL/min — AB (ref >60)
Glucose, Bld: 76 mg/dL (ref 70–99)
Potassium: 3.9 mmol/L (ref 3.5–5.1)
Sodium: 139 mmol/L (ref 135–145)
TOTAL PROTEIN: 6.9 g/dL (ref 6.5–8.1)

## 2017-12-07 LAB — CBC
HEMATOCRIT: 30.8 % — AB (ref 40.0–52.0)
Hemoglobin: 10.4 g/dL — ABNORMAL LOW (ref 13.0–18.0)
MCH: 31.6 pg (ref 26.0–34.0)
MCHC: 33.7 g/dL (ref 32.0–36.0)
MCV: 93.8 fL (ref 80.0–100.0)
PLATELETS: 147 10*3/uL — AB (ref 150–440)
RBC: 3.29 MIL/uL — AB (ref 4.40–5.90)
RDW: 15.5 % — ABNORMAL HIGH (ref 11.5–14.5)
WBC: 6.4 10*3/uL (ref 3.8–10.6)

## 2017-12-07 LAB — URINALYSIS, COMPLETE (UACMP) WITH MICROSCOPIC
Bacteria, UA: NONE SEEN
Bilirubin Urine: NEGATIVE
GLUCOSE, UA: NEGATIVE mg/dL
HGB URINE DIPSTICK: NEGATIVE
Ketones, ur: NEGATIVE mg/dL
Leukocytes, UA: NEGATIVE
Nitrite: NEGATIVE
PH: 6 (ref 5.0–8.0)
PROTEIN: NEGATIVE mg/dL
SPECIFIC GRAVITY, URINE: 1.013 (ref 1.005–1.030)

## 2017-12-07 LAB — GLUCOSE, CAPILLARY
Glucose-Capillary: 56 mg/dL — ABNORMAL LOW (ref 70–99)
Glucose-Capillary: 60 mg/dL — ABNORMAL LOW (ref 70–99)
Glucose-Capillary: 141 mg/dL — ABNORMAL HIGH (ref 70–99)
Glucose-Capillary: 192 mg/dL — ABNORMAL HIGH (ref 70–99)

## 2017-12-07 LAB — TROPONIN I: TROPONIN I: 0.07 ng/mL — AB (ref <0.03)

## 2017-12-07 MED ORDER — MONTELUKAST SODIUM 10 MG PO TABS
10.0000 mg | ORAL_TABLET | Freq: Every day | ORAL | Status: DC
  Administered 2017-12-07 – 2017-12-08 (×2): 10 mg via ORAL
  Filled 2017-12-07 (×2): qty 1

## 2017-12-07 MED ORDER — SODIUM CHLORIDE 0.9 % IV SOLN
1000.0000 mL | Freq: Once | INTRAVENOUS | Status: AC
  Administered 2017-12-07: 1000 mL via INTRAVENOUS

## 2017-12-07 MED ORDER — VITAMIN C 500 MG PO TABS
250.0000 mg | ORAL_TABLET | Freq: Every day | ORAL | Status: DC
  Administered 2017-12-08: 250 mg via ORAL
  Filled 2017-12-07: qty 0.5

## 2017-12-07 MED ORDER — PANTOPRAZOLE SODIUM 40 MG PO TBEC
40.0000 mg | DELAYED_RELEASE_TABLET | Freq: Every day | ORAL | Status: DC
  Administered 2017-12-08 – 2017-12-09 (×2): 40 mg via ORAL
  Filled 2017-12-07 (×2): qty 1

## 2017-12-07 MED ORDER — APIXABAN 2.5 MG PO TABS
2.5000 mg | ORAL_TABLET | Freq: Two times a day (BID) | ORAL | Status: DC
  Administered 2017-12-07 – 2017-12-09 (×4): 2.5 mg via ORAL
  Filled 2017-12-07 (×4): qty 1

## 2017-12-07 MED ORDER — FERROUS SULFATE 325 (65 FE) MG PO TABS
325.0000 mg | ORAL_TABLET | Freq: Every day | ORAL | Status: DC
  Administered 2017-12-08 – 2017-12-09 (×2): 325 mg via ORAL
  Filled 2017-12-07 (×2): qty 1

## 2017-12-07 MED ORDER — PIOGLITAZONE HCL 30 MG PO TABS: 30.0000 mg | ORAL_TABLET | Freq: Every day | ORAL | Status: DC

## 2017-12-07 MED ORDER — PRAVASTATIN SODIUM 20 MG PO TABS
80.0000 mg | ORAL_TABLET | Freq: Every day | ORAL | Status: DC
  Administered 2017-12-07 – 2017-12-08 (×2): 80 mg via ORAL
  Filled 2017-12-07 (×2): qty 4

## 2017-12-07 MED ORDER — INSULIN ASPART 100 UNIT/ML ~~LOC~~ SOLN: 0.0000 [IU] | Freq: Every day | SUBCUTANEOUS | Status: DC

## 2017-12-07 MED ORDER — INSULIN ASPART 100 UNIT/ML ~~LOC~~ SOLN
0.0000 [IU] | Freq: Three times a day (TID) | SUBCUTANEOUS | Status: DC
  Administered 2017-12-07: 2 [IU] via SUBCUTANEOUS
  Administered 2017-12-08: 3 [IU] via SUBCUTANEOUS
  Administered 2017-12-08: 5 [IU] via SUBCUTANEOUS
  Administered 2017-12-08: 3 [IU] via SUBCUTANEOUS
  Administered 2017-12-09: 5 [IU] via SUBCUTANEOUS
  Filled 2017-12-07 (×5): qty 1

## 2017-12-07 MED ORDER — ONDANSETRON HCL 4 MG PO TABS: 4.0000 mg | ORAL_TABLET | Freq: Four times a day (QID) | ORAL | Status: DC | PRN

## 2017-12-07 MED ORDER — HYDROCODONE-ACETAMINOPHEN 5-325 MG PO TABS: 1.0000 | ORAL_TABLET | ORAL | Status: DC | PRN

## 2017-12-07 MED ORDER — ALBUTEROL SULFATE (2.5 MG/3ML) 0.083% IN NEBU: 2.5000 mg | INHALATION_SOLUTION | RESPIRATORY_TRACT | Status: DC | PRN

## 2017-12-07 MED ORDER — ACETAMINOPHEN 650 MG RE SUPP: 650.0000 mg | Freq: Four times a day (QID) | RECTAL | Status: DC | PRN

## 2017-12-07 MED ORDER — DEXTROSE-NACL 5-0.45 % IV SOLN
INTRAVENOUS | Status: DC
  Administered 2017-12-07: 15:00:00 via INTRAVENOUS

## 2017-12-07 MED ORDER — POLYETHYLENE GLYCOL 3350 17 G PO PACK: 17.0000 g | PACK | Freq: Every day | ORAL | Status: DC | PRN

## 2017-12-07 MED ORDER — GLIPIZIDE 10 MG PO TABS: 10.0000 mg | ORAL_TABLET | Freq: Every day | ORAL | Status: DC

## 2017-12-07 MED ORDER — BISACODYL 10 MG RE SUPP: 10.0000 mg | Freq: Every day | RECTAL | Status: DC | PRN

## 2017-12-07 MED ORDER — MEGESTROL ACETATE 400 MG/10ML PO SUSP
400.0000 mg | Freq: Two times a day (BID) | ORAL | Status: DC
  Administered 2017-12-07 – 2017-12-09 (×5): 400 mg via ORAL
  Filled 2017-12-07 (×6): qty 10

## 2017-12-07 MED ORDER — DAKINS (1/2 STRENGTH) 0.25 % EX SOLN
1.0000 "application " | Freq: Every day | CUTANEOUS | Status: DC
  Administered 2017-12-07 – 2017-12-08 (×2): 1 via TOPICAL
  Filled 2017-12-07: qty 473

## 2017-12-07 MED ORDER — SODIUM CHLORIDE 0.45 % IV SOLN
INTRAVENOUS | Status: DC
  Administered 2017-12-07: 15:00:00 via INTRAVENOUS

## 2017-12-07 MED ORDER — FELODIPINE ER 5 MG PO TB24
5.0000 mg | ORAL_TABLET | Freq: Every day | ORAL | Status: DC
  Administered 2017-12-08 – 2017-12-09 (×2): 5 mg via ORAL
  Filled 2017-12-07 (×2): qty 1

## 2017-12-07 MED ORDER — NYSTATIN 100000 UNIT/GM EX CREA
TOPICAL_CREAM | Freq: Two times a day (BID) | CUTANEOUS | Status: DC
  Administered 2017-12-07 – 2017-12-08 (×3): via TOPICAL
  Filled 2017-12-07 (×2): qty 15

## 2017-12-07 MED ORDER — ONDANSETRON HCL 4 MG/2ML IJ SOLN: 4.0000 mg | Freq: Four times a day (QID) | INTRAMUSCULAR | Status: DC | PRN

## 2017-12-07 MED ORDER — ACETAMINOPHEN 325 MG PO TABS: 650.0000 mg | ORAL_TABLET | Freq: Four times a day (QID) | ORAL | Status: DC | PRN

## 2017-12-07 MED ORDER — ADULT MULTIVITAMIN W/MINERALS CH
1.0000 | ORAL_TABLET | Freq: Every day | ORAL | Status: DC
  Administered 2017-12-08 – 2017-12-09 (×2): 1 via ORAL
  Filled 2017-12-07 (×2): qty 1

## 2017-12-07 MED ORDER — NYSTATIN 100000 UNIT/GM EX POWD
1.0000 g | Freq: Two times a day (BID) | CUTANEOUS | Status: DC
Start: 2017-12-07 — End: 2017-12-09
  Administered 2017-12-07 – 2017-12-08 (×3): 1 g via TOPICAL
  Filled 2017-12-07: qty 15

## 2017-12-07 MED ORDER — LORATADINE 10 MG PO TABS
10.0000 mg | ORAL_TABLET | Freq: Every day | ORAL | Status: DC
  Administered 2017-12-08 – 2017-12-09 (×2): 10 mg via ORAL
  Filled 2017-12-07 (×2): qty 1

## 2017-12-07 MED ORDER — POLYETHYLENE GLYCOL 3350 17 G PO PACK
17.0000 g | PACK | Freq: Every day | ORAL | Status: DC | PRN
Start: 2017-12-07 — End: 2017-12-09

## 2017-12-07 NOTE — ED Notes (Signed)
Pt is being admitted to the floor rm 234. Quincy Simmonds., RN was given report and she verbalized understanding of all information provided.

## 2017-12-07 NOTE — Consult Note (Signed)
[place] Nurse wound consult note Evaluation of patient completed in [facility] in the presence of his primary care RN Reason for Consult: Chronic wound on lateral aspect of left foot A 6 cm x 2 cm x unknown depth wound was found on the lateral aspect of the left foot.  The depth is unknown due to the wound bed with yellow slough, obscuring the depth.  The primary RN had just removed the [place] and kerlex to the left lower leg, that did not cover the foot.  The foot is edematous.  At the completion of my exam the primary RN was reviewing the patient's orders and discovered an order for Dakin's solution to the wound.  Therefore, I am signing off on the care of the wound and canceling the consult. Monitor the wound area(s) for worsening of condition such as: Signs/symptoms of infection,  Increase in size,  Development of or worsening of odor, Development of pain, or increased pain at the affected locations.  Notify the medical team if any of these develop.  Thank you for the consult.  Discussed plan of care with the patient and bedside nurse.  [place] nurse will not follow at this time.  Please re-consult the [place] team if needed.  Val Riles, RN, MSN, CWOCN, CNS-BC, pager [phone]

## 2017-12-07 NOTE — Progress Notes (Signed)
Family Meeting Note  Advance Directive:yes  Today a meeting took place with the Patient.  Patient is able to participate   The following clinical team members were present during this meeting:MD  The following were discussed:Patient's diagnosis: Chronic kidney disease, acute kidney injury, poor appetite, A. fib, bradycardia, diabetes, right leg amputation, anemia, hyperlipidemia, hypertension, left foot wound, Patient's progosis: Unable to determine and Goals for treatment: Full Code  Additional follow-up to be provided: prn  Time spent during discussion:20 minutes  Gorden Harms, MD

## 2017-12-07 NOTE — ED Notes (Signed)
Christene Lye, MD at bedside.

## 2017-12-07 NOTE — Progress Notes (Signed)
CCMD called to say that the patient was having a slow ventricular response and heart rate would bray down.  Dr Bridgett Larsson notified.  No new orders at this time

## 2017-12-07 NOTE — ED Provider Notes (Signed)
[facility] Emergency Department Provider Note   ____________________________________________    I have reviewed the triage vital signs and the nursing notes.   HISTORY  Chief Complaint Weakness     HPI Miguel Torres is a 84 y.o. male presents with complaints of diffuse weakness.  Patient with a history of diabetes, right lower leg amputation, recently completed rehab and had been doing better, over the last several days has steadily felt weaker and weaker.  Denies cough, shortness of breath, chest pain.  Chronic wound left lower leg managed by wound care.  Patient feels his symptoms are related to being on Levaquin which was started apparently for "fluid on the lungs ".   Past Medical History:  Diagnosis Date  . Anemia   . Diabetes mellitus without complication ([place])   . Hyperlipidemia   . Hypertension   . Skin cancer     Patient Active Problem List   Diagnosis Date Noted  . Foot ulceration, left, with fat layer exposed ([place]) 11/23/2017  . Edema of left lower extremity 11/23/2017  . Pressure injury of skin 11/02/2017  . Elevated troponin 11/01/2017  . Microalbuminuria 10/15/2016  . Vitamin D deficiency 07/12/2015  . Retinopathy, diabetic, proliferative ([place]) 07/02/2015  . Bleeding duodenal ulcer 02/02/2015  . Atrial flutter ([place]) 01/19/2015  . Allergic rhinitis 01/17/2015  . Anemia of chronic disease 01/17/2015  . Chronic kidney disease (CKD), stage III (moderate) ([place]) 01/17/2015  . Diabetes mellitus with nephropathy ([place]) 01/17/2015  . Edema 01/17/2015  . Gout 01/17/2015  . Amputation of right lower extremity below knee upon examination ([place]) 01/17/2015  . HLD (hyperlipidemia) 01/17/2015  . Psoriasis 01/17/2015  . CA of skin 01/17/2015  . Hypertension 10/27/2014  . Mild cognitive disorder 12/05/2013  . Cancer of skin, squamous cell 01/21/2013  . Squamous cell carcinoma of scalp and skin of neck 03/03/2011    Past Surgical  History:  Procedure Laterality Date  . BASAL CELL CARCINOMA EXCISION    . History of eye surgery Bilateral   . MOHS SURGERY  04/2011   the top of his head  . Right BKA  09/12/2010   Dr. Delana Meyer; Secondary gangrenous right LE  . TONSILLECTOMY      Prior to Admission medications   Medication Sig Start Date End Date Taking? Authorizing Provider  apixaban (ELIQUIS) 2.5 MG TABS tablet Take 1 tablet (2.5 mg total) by mouth 2 (two) times daily. 11/04/17   Henreitta Leber, MD  ascorbic acid (VITAMIN C) 250 MG tablet Take 250 mg by mouth daily.    [provider]  felodipine (PLENDIL) 5 MG 24 hr tablet Take 1 tablet (5 mg total) by mouth daily. 11/04/17   Henreitta Leber, MD  ferrous sulfate 325 (65 FE) MG tablet Take 1 tablet by mouth daily. 06/05/11   [provider]  furosemide (LASIX) 20 MG tablet Take 20 mg by mouth 2 (two) times daily.    [provider]  glipiZIDE (GLUCOTROL) 10 MG tablet TAKE 1 TABLET (10 MG TOTAL) BY MOUTH DAILY. 04/06/17   [name], MD  loratadine (CLARITIN) 10 MG tablet Take 10 mg by mouth daily.    [provider]  lovastatin (MEVACOR) 40 MG tablet TAKE 1 TABLET (40 MG TOTAL) BY MOUTH DAILY. 05/11/17   [name], MD  meloxicam (MOBIC) 7.5 MG tablet Take 1 tablet (7.5 mg total) by mouth daily. 11/05/17   Henreitta Leber, MD  metFORMIN (GLUCOPHAGE-XR) 500  MG 24 hr tablet TAKE 1 TABLET BY MOUTH EVERY DAY 09/16/16   [name], MD  metolazone (ZAROXOLYN) 5 MG tablet Take 5 mg by mouth daily.    [provider]  montelukast (SINGULAIR) 10 MG tablet TAKE 1 TABLET (10 MG TOTAL) BY MOUTH EVERY EVENING. 06/27/17   [name], MD  pantoprazole (PROTONIX) 40 MG tablet Take 40 mg by mouth daily.    [provider]  pioglitazone (ACTOS) 30 MG tablet TAKE 1 TABLET (30 MG TOTAL) BY MOUTH DAILY. 12/02/17   [name], MD  polyethylene glycol [facility] / Floria Raveling) packet Take 17 g by mouth daily as needed for  mild constipation.  02/04/13   [provider]  silver sulfADIAZINE (SILVADENE) 1 % cream Apply 1 application topically daily. 07/27/17   [name], MD  Vitamin D, Ergocalciferol, (DRISDOL) 50000 units CAPS capsule TAKE ONE CAPSULE BY MOUTH EVERY 7 DAYS 10/21/17   [name], MD  zinc sulfate 220 (50 Zn) MG capsule Take 220 mg by mouth daily.    [provider]     Allergies Patient has no known allergies.  Family History  Problem Relation Age of Onset  . Diabetes Mother   . Arthritis Sister   . Diabetes Daughter     Social History Social History   Tobacco Use  . Smoking status: Never Smoker  . Smokeless tobacco: Never Used  Substance Use Topics  . Alcohol use: No  . Drug use: No    Review of Systems  Constitutional: No fever/chills Eyes: No visual changes.  ENT: No sore throat. Cardiovascular: Denies chest pain. Respiratory: Denies shortness of breath. Gastrointestinal: No abdominal pain.  No nausea, no vomiting.   Genitourinary: Negative for dysuria. Musculoskeletal: Negative for back pain. Skin: Chronic wound Neurological: Negative for headaches   ____________________________________________   PHYSICAL EXAM:  VITAL SIGNS: ED Triage Vitals  Enc Vitals Group     BP 12/07/17 0914 134/62     Pulse Rate 12/07/17 0914 (!) 57     Resp 12/07/17 0914 15     Temp 12/07/17 0914 98.3 F (36.8 C)     Temp Source 12/07/17 0914 Oral     SpO2 12/07/17 0914 94 %     Weight 12/07/17 0918 104.3 kg (230 lb)     Height 12/07/17 0918 1.727 m (5\' 8" )     Head Circumference --      Peak Flow --      Pain Score 12/07/17 0926 0     Pain Loc --      Pain Edu? --      Excl. in [place]? --     Constitutional: Alert and oriented. No acute distress.  Eyes: Conjunctivae are normal.   Nose: No congestion/rhinnorhea. Mouth/Throat: Mucous membranes are moist.    Cardiovascular: Normal rate, regular rhythm. Grossly normal heart sounds.  Good peripheral  circulation. Respiratory: Normal respiratory effort.  No retractions. Lungs CTAB. Gastrointestinal: Soft and nontender. No distention.    Musculoskeletal: Right AKA.  Left leg warm and well perfused, [place]na boot Neurologic:  Normal speech and language. No gross focal neurologic deficits are appreciated.  Skin:  Skin is warm, dry Psychiatric: Mood and affect are normal. Speech and behavior are normal.  ____________________________________________   LABS (all labs ordered are listed, but only abnormal results are displayed)  Labs Reviewed  CBC - Abnormal; Notable for the following components:      Result Value   RBC 3.29 (*)  Hemoglobin 10.4 (*)    HCT 30.8 (*)    RDW 15.5 (*)    Platelets 147 (*)    All other components within normal limits  COMPREHENSIVE METABOLIC PANEL - Abnormal; Notable for the following components:   BUN 64 (*)    Creatinine, Ser 2.57 (*)    Calcium 8.3 (*)    Albumin 3.1 (*)    AST 43 (*)    GFR calc non Af Amer 21 (*)    GFR calc Af Amer 25 (*)    All other components within normal limits  TROPONIN I - Abnormal; Notable for the following components:   Troponin I 0.07 (*)    All other components within normal limits  URINALYSIS, COMPLETE (UACMP) WITH MICROSCOPIC   ____________________________________________  EKG  ED ECG REPORT I, Lavonia Drafts, the attending physician, personally viewed and interpreted this ECG.  Date: 12/07/2017  Rhythm: Sinus bradycardia  QRS Axis: normal Intervals: normal ST/T Wave abnormalities: normal Narrative Interpretation: no evidence of acute ischemia  ____________________________________________  RADIOLOGY  X-ray shows equivocal left pleural effusion, no evidence of pneumonia ____________________________________________   PROCEDURES  Procedure(s) performed: No  Procedures   Critical Care performed: No ____________________________________________   INITIAL IMPRESSION / ASSESSMENT AND PLAN / ED  COURSE  Pertinent labs & imaging results that were available during my care of the patient were reviewed by me and considered in my medical decision making (see chart for details).  Patient presents with progressively worsening weakness over the last 4 to 5 days.  Unclear etiology, may be worsening of pleural effusion that he reportedly was diagnosed with.  ----------------------------------------- 12:01 PM on 12/07/2017 -----------------------------------------  Patient with evidence of acute kidney injury on lab work, will give IV fluids admit to the hospitalist service, chronically elevated troponin   ____________________________________________   FINAL CLINICAL IMPRESSION(S) / ED DIAGNOSES  Final diagnoses:  AKI (acute kidney injury) ([place])        Note:  This document was prepared using Dragon voice recognition software and may include unintentional dictation errors.    Lavonia Drafts, MD 12/07/17 1201

## 2017-12-07 NOTE — Progress Notes (Signed)
Inpatient Diabetes Program Recommendations  AACE/ADA: New Consensus Statement on Inpatient Glycemic Control (2019)  Target Ranges:  Prepandial:   less than 140 mg/dL      Peak postprandial:   less than 180 mg/dL (1-2 hours)      Critically ill patients:  140 - 180 mg/dL  Results for ANTONIUS, HARTLAGE (MRN [id number]) as of 12/07/2017 14:55  Ref. Range 12/07/2017 14:03 12/07/2017 14:36  Glucose-Capillary Latest Ref Range: 70 - 99 mg/dL 56 (L) 60 (L)  Results for LEDARRIUS, BEAUCHAINE (MRN [id number]) as of 12/07/2017 14:55  Ref. Range 12/07/2017 10:23  Glucose Latest Ref Range: 70 - 99 mg/dL 76    Review of Glycemic Control  Diabetes history: DM2 Outpatient Diabetes medications: Metformin 500 mg QAM, Actos 30 mg daily, Glipizide 10 mg daily Current orders for Inpatient glycemic control: Glipizide 10 mg QAM, Actos 30 mg daily, Novolog 0-15 units TID with meals, Novolog 0-5 units QHS  Inpatient Diabetes Program Recommendations: Oral Agents: Glucose noted to be 76/56/60 mg/dl today, patient has acute renal failure and poor PO intake. While inpatient, please consider discontinuing Glipizide and Actos at this time.  Thanks, Barnie Alderman, RN, MSN, CDE Diabetes Coordinator Inpatient Diabetes Program [phone] (Team Pager from 8am to 5pm)

## 2017-12-07 NOTE — ED Notes (Signed)
Pt is AOx4, vss, he is in bed with rails upx2, on the monitor and denies any pain at this time. Pt reports "just feeling weak." Pt denies chest pain, SHOB, dizziness, urinary symptoms, headache, N/V/D. We will continue to monitor the pt.

## 2017-12-07 NOTE — Progress Notes (Signed)
PT Cancellation Note  Patient Details Name: Miguel Torres MRN: [id number] DOB: 1931-01-30   Cancelled Treatment:    Reason Eval/Treat Not Completed: Medical issues which prohibited therapy.  Order received.  Chart reviewed.  RN with pt checking glucose.  Pt currently hypoglycemic and food and orange juice being provided.  Will re-attempt later if time allows.   Roxanne Gates, PT, DPT 12/07/2017, 2:41 PM

## 2017-12-07 NOTE — ED Triage Notes (Signed)
Pt arrived via [place] with daughter, states pt was discharged from [facility] for rehab for a month and since coming home Wednesday, pt states he has been getting weaker and no appetite.  Pt states he was told he had fluid in his lungs and reports overall increased weakness since coming home.   Pt also has wound to left foot.

## 2017-12-07 NOTE — ED Notes (Signed)
First Nurse Note: Patient recently in [facility] for [employer].  Daughter states he needs to be checked for weakness.

## 2017-12-07 NOTE — ED Notes (Signed)
Pt to Xray with tech.

## 2017-12-07 NOTE — H&P (Signed)
[place] [facility] [place] NAME: Miguel Torres    MR#:  [id number]  DATE OF BIRTH:  1930/07/04  DATE OF ADMISSION:  12/07/2017  PRIMARY CARE PHYSICIAN: [name], MD   REQUESTING/REFERRING PHYSICIAN:   CHIEF COMPLAINT:   Chief Complaint  Patient presents with  . Weakness    HISTORY OF PRESENT ILLNESS: Miguel Torres  is a 84 y.o. male with a known history per below, last admission patient was discharged to skilled nursing facility-was discharged last Wednesday to home in care of family, her patient's daughter and patient-he has had progressive generalized weakness/fatigue, poor appetite, ER work-up noted for acute kidney injury with creatinine 2.5 baseline around 1.5, troponin 0.07, EKG noted for right bundle branch block/poor R wave progression/A. fib which is rate controlled, in the emergency room patient is without any complaint other than above, daughter at the bedside, patient now been admitted for acute kidney injury with chronic kidney disease stage III due to poor appetite and diuretic usage.  PAST MEDICAL HISTORY:   Past Medical History:  Diagnosis Date  . Anemia   . Diabetes mellitus without complication ([place])   . Hyperlipidemia   . Hypertension   . Skin cancer     PAST SURGICAL HISTORY:  Past Surgical History:  Procedure Laterality Date  . BASAL CELL CARCINOMA EXCISION    . History of eye surgery Bilateral   . MOHS SURGERY  04/2011   the top of his head  . Right BKA  09/12/2010   Dr. Delana Meyer; Secondary gangrenous right LE  . TONSILLECTOMY      SOCIAL HISTORY:  Social History   Tobacco Use  . Smoking status: Never Smoker  . Smokeless tobacco: Never Used  Substance Use Topics  . Alcohol use: No    FAMILY HISTORY:  Family History  Problem Relation Age of Onset  . Diabetes Mother   . Arthritis Sister   . Diabetes Daughter     DRUG ALLERGIES: No Known Allergies  REVIEW OF SYSTEMS:   CONSTITUTIONAL: No  fever, +fatigue, weakness.  EYES: No blurred or double vision.  EARS, NOSE, AND THROAT: No tinnitus or ear pain.  RESPIRATORY: No cough, shortness of breath, wheezing or hemoptysis.  CARDIOVASCULAR: No chest pain, orthopnea, edema.  GASTROINTESTINAL: No nausea, vomiting, diarrhea or abdominal pain.  GENITOURINARY: No dysuria, hematuria.  ENDOCRINE: No polyuria, nocturia,  HEMATOLOGY: No anemia, easy bruising or bleeding SKIN: No rash or lesion. MUSCULOSKELETAL: No joint pain or arthritis.   NEUROLOGIC: No tingling, numbness, weakness.  PSYCHIATRY: No anxiety or depression.   MEDICATIONS AT HOME:  Prior to Admission medications   Medication Sig Start Date End Date Taking? Authorizing Provider  apixaban (ELIQUIS) 2.5 MG TABS tablet Take 1 tablet (2.5 mg total) by mouth 2 (two) times daily. 11/04/17  Yes Sainani, Belia Heman, MD  ascorbic acid (VITAMIN C) 250 MG tablet Take 250 mg by mouth daily.   Yes [provider]  [place] 0.5 % SOLN Apply 1 application topically daily. LEFT FOOT   Yes [provider]  felodipine (PLENDIL) 5 MG 24 hr tablet Take 1 tablet (5 mg total) by mouth daily. 11/04/17  Yes Sainani, Belia Heman, MD  ferrous sulfate 325 (65 FE) MG tablet Take 1 tablet by mouth daily. 06/05/11  Yes [provider]  furosemide (LASIX) 20 MG tablet Take 20 mg by mouth 3 (three) times daily.    Yes [provider]  glipiZIDE (GLUCOTROL) 10 MG tablet  TAKE 1 TABLET (10 MG TOTAL) BY MOUTH DAILY. 04/06/17  Yes [name], MD  loratadine (CLARITIN) 10 MG tablet Take 10 mg by mouth daily.   Yes [provider]  lovastatin (MEVACOR) 40 MG tablet TAKE 1 TABLET (40 MG TOTAL) BY MOUTH DAILY. 05/11/17  Yes [name], MD  metFORMIN (GLUCOPHAGE-XR) 500 MG 24 hr tablet TAKE 1 TABLET BY MOUTH EVERY DAY 09/16/16  Yes [name], MD  metolazone (ZAROXOLYN) 5 MG tablet Take 5 mg by mouth daily.   Yes [provider]  montelukast (SINGULAIR) 10 MG  tablet TAKE 1 TABLET (10 MG TOTAL) BY MOUTH EVERY EVENING. 06/27/17  Yes [name], MD  Multiple Vitamin (MULTIVITAMIN) tablet Take 1 tablet by mouth daily.   Yes [provider]  nystatin (NYSTATIN) powder Apply 1 g topically 2 (two) times daily.   Yes [provider]  pantoprazole (PROTONIX) 40 MG tablet Take 40 mg by mouth daily.   Yes [provider]  pioglitazone (ACTOS) 30 MG tablet TAKE 1 TABLET (30 MG TOTAL) BY MOUTH DAILY. 12/02/17  Yes [name], MD  Vitamin D, Ergocalciferol, (DRISDOL) 50000 units CAPS capsule TAKE ONE CAPSULE BY MOUTH EVERY 7 DAYS 10/21/17  Yes [name], MD  meloxicam (MOBIC) 7.5 MG tablet Take 1 tablet (7.5 mg total) by mouth daily. Patient not taking: Reported on 12/07/2017 11/05/17   Henreitta Leber, MD  polyethylene glycol [facility] / Floria Raveling) packet Take 17 g by mouth daily as needed for mild constipation.  02/04/13   [provider]  silver sulfADIAZINE (SILVADENE) 1 % cream Apply 1 application topically daily. Patient not taking: Reported on 12/07/2017 07/27/17   [name], MD      PHYSICAL EXAMINATION:   VITAL SIGNS: Blood pressure 134/62, pulse (!) 57, temperature 98.3 F (36.8 C), temperature source Oral, resp. rate 15, height 5\' 8"  (1.727 m), weight 104.3 kg (230 lb), SpO2 94 %.  GENERAL:  84 y.o.-year-old patient lying in the bed with no acute distress.  Morbid obesity EYES: Pupils equal, round, reactive to light and accommodation. No scleral icterus. Extraocular muscles intact.  HEENT: Head atraumatic, normocephalic. Oropharynx and nasopharynx clear.  NECK:  Supple, no jugular venous distention. No thyroid enlargement, no tenderness.  LUNGS: Normal breath sounds bilaterally, no wheezing, rales,rhonchi or crepitation. No use of accessory muscles of respiration.  CARDIOVASCULAR: S1, S2 normal. No murmurs, rubs, or gallops.  ABDOMEN: Soft, nontender, nondistended. Bowel sounds present. No  organomegaly or mass.  EXTREMITIES: No pedal edema, cyanosis, or clubbing.  Right lower extremity amputee-prosthesis in place NEUROLOGIC: Cranial nerves II through XII are intact. MAES. Gait not checked.  PSYCHIATRIC: The patient is alert and oriented x 3.  SKIN: No obvious rash, lesion, or ulcer.   LABORATORY PANEL:   CBC Recent Labs  Lab 12/07/17 1023  WBC 6.4  HGB 10.4*  HCT 30.8*  PLT 147*  MCV 93.8  MCH 31.6  MCHC 33.7  RDW 15.5*   ------------------------------------------------------------------------------------------------------------------  Chemistries  Recent Labs  Lab 12/07/17 1023  NA 139  K 3.9  CL 99  CO2 27  GLUCOSE 76  BUN 64*  CREATININE 2.57*  CALCIUM 8.3*  AST 43*  ALT 15  ALKPHOS 106  BILITOT 1.0   ------------------------------------------------------------------------------------------------------------------ estimated creatinine clearance is 24.6 mL/min (A) (by C-G formula based on SCr of 2.57 mg/dL (H)). ------------------------------------------------------------------------------------------------------------------ No results for input(s): TSH, T4TOTAL, T3FREE, THYROIDAB in the last 72 hours.  Invalid input(s):  FREET3   Coagulation profile No results for input(s): INR, PROTIME in the last 168 hours. ------------------------------------------------------------------------------------------------------------------- No results for input(s): DDIMER in the last 72 hours. -------------------------------------------------------------------------------------------------------------------  Cardiac Enzymes Recent Labs  Lab 12/07/17 1023  TROPONINI 0.07*   ------------------------------------------------------------------------------------------------------------------ Invalid input(s): POCBNP  ---------------------------------------------------------------------------------------------------------------  Urinalysis    Component Value  Date/Time   COLORURINE YELLOW (A) 12/07/2017 1159   APPEARANCEUR CLEAR (A) 12/07/2017 1159   LABSPEC 1.013 12/07/2017 1159   PHURINE 6.0 12/07/2017 1159   GLUCOSEU NEGATIVE 12/07/2017 1159   HGBUR NEGATIVE 12/07/2017 1159   BILIRUBINUR NEGATIVE 12/07/2017 1159   KETONESUR NEGATIVE 12/07/2017 1159   PROTEINUR NEGATIVE 12/07/2017 1159   NITRITE NEGATIVE 12/07/2017 1159   LEUKOCYTESUR NEGATIVE 12/07/2017 1159     RADIOLOGY: Dg Chest 2 View  Result Date: 12/07/2017 CLINICAL DATA:  Weakness.  Hypertension. EXAM: CHEST - 2 VIEW COMPARISON:  November 01, 2017 FINDINGS: There is an equivocal left pleural effusion. There is no edema or consolidation. Heart is upper normal in size with pulmonary vascularity normal. No adenopathy. Bones are osteoporotic. There is degenerative change in the thoracic spine. There is aortic atherosclerosis. IMPRESSION: Equivocal left pleural effusion. No edema or consolidation. Stable cardiac silhouette. There is aortic atherosclerosis. Bones osteoporotic. Aortic Atherosclerosis (ICD10-I70.0). Electronically Signed   By: Lowella Grip III M.D.   On: 12/07/2017 11:18    EKG: Orders placed or performed during the hospital encounter of 12/07/17  . EKG 12-Lead  . EKG 12-Lead    IMPRESSION AND PLAN: 84 year old male with past medical history of hypertension, hyperlipidemia, diabetes, anemia of chronic disease, atrial fibrillation who presents to the hospital due to progressive generalized weakness with poor appetite since discharge from skilled nursing facility on Wednesday   *Acute kidney injury with chronic kidney disease stage III Most likely secondary to poor appetite/poor p.o. Intake in the setting of diuretic usage Refer to the observation unit, gentle IV fluids for rehydration, hold Lasix/Zaroxolyn for now, avoid nephrotoxic agents, strict I&O monitoring, daily weights, BMP in the morning  *Acute generalized weakness Most likely secondary to acute dehydration,  poor appetite, and generalized deconditioning Physical therapy to evaluate/treat, fall precautions while in house  *Acute poor appetite Start Megace twice daily, dietary to see, encourage p.o. intake  *Chronic left foot ulceration Requires daily dressing changes  wound care nurse consulted to evaluate/treat Will need to follow-up with wound clinic status post discharge for continued medical monitoring/care  *Elevated troponin Most likely secondary to demand ischemia  we will continue to cycle cardiac enzymes Most recent echocardiogram noted for normal ejection fraction with no wall motion abnormalities  *Chronic A. fib with episodes of bradycardia  Continue Eliquis   *Chronic diabetes mellitus type 2  Stable  Hold metformin, continue glipizide, Actos, sliding scale insulin with Accu-Cheks per routine   *Diastolic congestive heart failure without exacerbation Hold Lasix/Zaroxolyn for now stated above, continue to avoid beta-blocker therapy given history of chronic bradycardia, on Eliquis, statin therapy  *Chronic benign essential hypertension Stable on home regiment which will be continued  *Chronic hyperlipidemia  Stable Continue statin therapy  Disposition home on tomorrow barring any complications  All the records are reviewed and case discussed with ED provider. Management plans discussed with the patient, family and they are in agreement.  CODE STATUS:full Code Status History    Date Active Date Inactive Code Status Order ID Comments User Context   11/01/2017 1358 11/04/2017 1719 Full Code [id number]  Gladstone Lighter, MD Inpatient    Advance Directive Documentation  Most Recent Value  Type of Advance Directive  Healthcare Power of Attorney, Living will, Out of facility DNR (pink MOST or yellow form)  Pre-existing out of facility DNR order (yellow form or pink MOST form)  Physician notified to receive inpatient order  "MOST" Form in Place?  -       TOTAL TIME  TAKING CARE OF THIS PATIENT: 45 minutes.    Avel Peace Jayro Mcmath M.D on 12/07/2017   Between 7am to 6pm - Pager - [phone]  After 6pm go to [email or web address] - password [email or web address] [facility]  Office  [phone]  CC: Primary care physician; [name], MD   Note: This dictation was prepared with Dragon dictation along with smaller phrase technology. Any transcriptional errors that result from this process are unintentional.

## 2017-12-07 NOTE — Progress Notes (Signed)
Order received from Dr Jerelyn Charles for nystatin for redness in groin

## 2017-12-08 DIAGNOSIS — N179 Acute kidney failure, unspecified: Secondary | ICD-10-CM | POA: Diagnosis not present

## 2017-12-08 DIAGNOSIS — R630 Anorexia: Secondary | ICD-10-CM | POA: Diagnosis not present

## 2017-12-08 DIAGNOSIS — R531 Weakness: Secondary | ICD-10-CM | POA: Diagnosis not present

## 2017-12-08 DIAGNOSIS — N183 Chronic kidney disease, stage 3 (moderate): Secondary | ICD-10-CM | POA: Diagnosis not present

## 2017-12-08 LAB — BASIC METABOLIC PANEL
Anion gap: 7 (ref 5–15)
BUN: 57 mg/dL — ABNORMAL HIGH (ref 8–23)
CALCIUM: 8 mg/dL — AB (ref 8.9–10.3)
CO2: 29 mmol/L (ref 22–32)
Chloride: 102 mmol/L (ref 98–111)
Creatinine, Ser: 2.25 mg/dL — ABNORMAL HIGH (ref 0.61–1.24)
GFR calc non Af Amer: 25 mL/min — ABNORMAL LOW (ref >60)
GFR, EST AFRICAN AMERICAN: 29 mL/min — AB (ref >60)
Glucose, Bld: 189 mg/dL — ABNORMAL HIGH (ref 70–99)
Potassium: 3.5 mmol/L (ref 3.5–5.1)
Sodium: 138 mmol/L (ref 135–145)

## 2017-12-08 LAB — GLUCOSE, CAPILLARY
GLUCOSE-CAPILLARY: 151 mg/dL — AB (ref 70–99)
GLUCOSE-CAPILLARY: 160 mg/dL — AB (ref 70–99)
GLUCOSE-CAPILLARY: 178 mg/dL — AB (ref 70–99)
Glucose-Capillary: 205 mg/dL — ABNORMAL HIGH (ref 70–99)

## 2017-12-08 LAB — TROPONIN I
TROPONIN I: 0.07 ng/mL — AB (ref <0.03)
Troponin I: 0.07 ng/mL (ref <0.03)

## 2017-12-08 MED ORDER — POTASSIUM CHLORIDE IN NACL 20-0.9 MEQ/L-% IV SOLN
INTRAVENOUS | Status: DC
  Administered 2017-12-08: 15:00:00 via INTRAVENOUS
  Filled 2017-12-08 (×3): qty 1000

## 2017-12-08 MED ORDER — VITAMIN C 500 MG PO TABS
250.0000 mg | ORAL_TABLET | Freq: Two times a day (BID) | ORAL | Status: DC
  Filled 2017-12-08 (×3): qty 0.5

## 2017-12-08 MED ORDER — JUVEN PO PACK
1.0000 | PACK | Freq: Two times a day (BID) | ORAL | Status: DC
  Administered 2017-12-08 (×2): 1 via ORAL

## 2017-12-08 MED ORDER — OCUVITE-LUTEIN PO CAPS
1.0000 | ORAL_CAPSULE | Freq: Every day | ORAL | Status: DC
  Administered 2017-12-08: 1 via ORAL
  Filled 2017-12-08 (×2): qty 1

## 2017-12-08 MED ORDER — ENSURE ENLIVE PO LIQD
237.0000 mL | Freq: Two times a day (BID) | ORAL | Status: DC
  Administered 2017-12-08 (×2): 237 mL via ORAL

## 2017-12-08 NOTE — Care Management (Signed)
Notified by Corene Cornea from [facility] that patient had orders from the community for RN and PT.  However he was not opened prior to admission

## 2017-12-08 NOTE — Evaluation (Signed)
Physical Therapy Evaluation Patient Details Name: Miguel Torres MRN: [id number] DOB: [birth year] Today's Date: 12/08/2017   History of Present Illness  Patient is an 84 year old male admitted from home for acute kidney injury.  PMh includes R AKA, chronic ulceration of L foot, skin CA, Htn, HLD, DM and anemia.  Clinical Impression  Patient is an 84 year old male who lives in a one story home alone.  He reports being independent with use of RW at baseline.  Pt A&) x4 but very HOH.  Pt in bed and able to perform bed mobility with min hand held assist to complete supine to sit.  He was able to sit at EOB with good balance and presented with good overall strength of UE and LE.  PT assisted pt in donning prosthesis on R LE and educated pt concerning placement of sleeve and suspension.  Pt able to perform STS with bed slightly elevated and demonstrated good use of RW.  PT assisted pt in donning walking shoe on L foot prior to ambulation.  He was able to ambulate 40 ft in room with RW and perform toilet transfer with min A to stabilize RW as pt stood up.  PT provided education concerning posture and RW placement related to fall prevention and pt was able to adjust.  He will continue to benefit from skilled PT with focus on balance and fall prevention, safe use of AD, education regarding prosthesis and safe functional mobility.    Follow Up Recommendations Home health PT;Supervision for mobility/OOB    Equipment Recommendations  None recommended by PT    Recommendations for Other Services       Precautions / Restrictions Precautions Precautions: Fall Restrictions Weight Bearing Restrictions: No Other Position/Activity Restrictions: Patient has a walking boot for WB on L foot.      Mobility  Bed Mobility Overal bed mobility: Needs Assistance Bed Mobility: Supine to Sit     Supine to sit: Min assist     General bed mobility comments: hand held assist to complete upright posture.  Able to  sit at EOB without assistance.  Transfers Overall transfer level: Needs assistance Equipment used: Rolling walker (2 wheeled) Transfers: Sit to/from Stand Sit to Stand: From elevated surface;Min assist         General transfer comment: Able to stand from EOB without assistance from PT.  PT provided stabilization of RW for pt to pull up from toilet as pt states that he has a bar to hold in front of him at home.  Ambulation/Gait Ambulation/Gait assistance: Min guard Gait Distance (Feet): 40 Feet Assistive device: Rolling walker (2 wheeled)     Gait velocity interpretation: 1.31 - 2.62 ft/sec, indicative of limited community ambulator General Gait Details: Moderate foot clearance, flexed posture with RW pushed anteriorly.  PT provided education concerning safe placement of RW and postural correction.  Stairs            Wheelchair Mobility    Modified Rankin (Stroke Patients Only)       Balance Overall balance assessment: Needs assistance Sitting-balance support: Feet supported Sitting balance-Leahy Scale: Good     Standing balance support: Bilateral upper extremity supported Standing balance-Leahy Scale: Fair                               Pertinent Vitals/Pain Pain Assessment: No/denies pain    Home Living Family/patient expects to be discharged to:: Private residence  Living Arrangements: Alone Available Help at Discharge: Family;Available PRN/intermittently(Sister and three children check in with patient frequently.) Type of Home: House Home Access: Level entry     Home Layout: One level Home Equipment: Walker - 2 wheels;Grab bars - toilet      Prior Function Level of Independence: Independent with assistive device(s)         Comments: Uses standard RW for home ambulation.     Hand Dominance        Extremity/Trunk Assessment   Upper Extremity Assessment Upper Extremity Assessment: Overall WFL for tasks assessed(hand, wrist and  elbow grossly 4/5 bilaterally)    Lower Extremity Assessment Lower Extremity Assessment: Overall WFL for tasks assessed(Grossly 4/5 )       Communication   Communication: HOH  Cognition Arousal/Alertness: Awake/alert Behavior During Therapy: WFL for tasks assessed/performed Overall Cognitive Status: Within Functional Limits for tasks assessed                                 General Comments: A&Ox4, follows commands consistently.      General Comments      Exercises Other Exercises Other Exercises: Assisted in donning prosthesis and educated pt concerning proper placement of sleeve and suspension. x4 min   Assessment/Plan    PT Assessment Patient needs continued PT services  PT Problem List Decreased balance;Decreased knowledge of use of DME       PT Treatment Interventions DME instruction;Therapeutic activities;Gait training;Therapeutic exercise;Balance training;Functional mobility training;Neuromuscular re-education    PT Goals (Current goals can be found in the Care Plan section)  Acute Rehab PT Goals Patient Stated Goal: To return to independent lifestyle. PT Goal Formulation: With patient Time For Goal Achievement: 12/22/17 Potential to Achieve Goals: Good    Frequency Min 2X/week   Barriers to discharge        Co-evaluation               AM-PAC PT "6 Clicks" Daily Activity  Outcome Measure Difficulty turning over in bed (including adjusting bedclothes, sheets and blankets)?: A Little Difficulty moving from lying on back to sitting on the side of the bed? : A Little Difficulty sitting down on and standing up from a chair with arms (e.g., wheelchair, bedside commode, etc,.)?: A Little Help needed moving to and from a bed to chair (including a wheelchair)?: A Little Help needed walking in hospital room?: A Little Help needed climbing 3-5 steps with a railing? : A Lot 6 Click Score: 17    End of Session Equipment Utilized During  Treatment: Gait belt Activity Tolerance: Patient tolerated treatment well Patient left: in chair;with nursing/sitter in room Nurse Communication: Mobility status PT Visit Diagnosis: Unsteadiness on feet (R26.81);Muscle weakness (generalized) (M62.81)    Time: 9892-1194 PT Time Calculation (min) (ACUTE ONLY): 27 min   Charges:   PT Evaluation $PT Eval Low Complexity: 1 Low PT Treatments $Therapeutic Activity: 8-22 mins        Roxanne Gates, PT, DPT   Roxanne Gates 12/08/2017, 9:19 AM

## 2017-12-08 NOTE — Progress Notes (Signed)
Dressing change on left foot, lateral. Applied gauze moistened with Dakins. Covered with ABD pad and wrapped with kerlix.

## 2017-12-08 NOTE — Progress Notes (Signed)
[facility] [place] NAME: Miguel Torres    MR#:  [id number]  DATE OF BIRTH:  June 24, 1930  SUBJECTIVE:  CHIEF COMPLAINT: Resting comfortably with no complaints  REVIEW OF SYSTEMS:  CONSTITUTIONAL: No fever, fatigue or weakness.  EYES: No blurred or double vision.  EARS, NOSE, AND THROAT: No tinnitus or ear pain.  RESPIRATORY: No cough, shortness of breath, wheezing or hemoptysis.  CARDIOVASCULAR: No chest pain, orthopnea, edema.  GASTROINTESTINAL: No nausea, vomiting, diarrhea or abdominal pain.  GENITOURINARY: No dysuria, hematuria.  ENDOCRINE: No polyuria, nocturia,  HEMATOLOGY: No anemia, easy bruising or bleeding SKIN: No rash or lesion. MUSCULOSKELETAL: No joint pain or arthritis.   NEUROLOGIC: No tingling, numbness, weakness.  PSYCHIATRY: No anxiety or depression.   DRUG ALLERGIES:  No Known Allergies  VITALS:  Blood pressure 132/61, pulse 62, temperature 98.4 F (36.9 C), temperature source Oral, resp. rate 18, height 5\' 8"  (1.727 m), weight 101.9 kg (224 lb 10.4 oz), SpO2 98 %.  PHYSICAL EXAMINATION:  GENERAL:  84 y.o.-year-old patient lying in the bed with no acute distress.  EYES: Pupils equal, round, reactive to light and accommodation. No scleral icterus. Extraocular muscles intact.  HEENT: Head atraumatic, normocephalic. Oropharynx and nasopharynx clear.  NECK:  Supple, no jugular venous distention. No thyroid enlargement, no tenderness.  LUNGS: Normal breath sounds bilaterally, no wheezing, rales,rhonchi or crepitation. No use of accessory muscles of respiration.  CARDIOVASCULAR: S1, S2 normal. No murmurs, rubs, or gallops.  ABDOMEN: Soft, nontender, nondistended. Bowel sounds present. No organomegaly or mass.  EXTREMITIES: No pedal edema, cyanosis, or clubbing.  Right BKA with prosthesis NEUROLOGIC: Cranial nerves II through XII are intact.  Sensation intact. Gait not checked.  PSYCHIATRIC: The patient is  alert and oriented x 3.  SKIN: No obvious rash, lesion, or ulcer.    LABORATORY PANEL:   CBC Recent Labs  Lab 12/07/17 1023  WBC 6.4  HGB 10.4*  HCT 30.8*  PLT 147*   ------------------------------------------------------------------------------------------------------------------  Chemistries  Recent Labs  Lab 12/07/17 1023 12/08/17 0512  NA 139 138  K 3.9 3.5  CL 99 102  CO2 27 29  GLUCOSE 76 189*  BUN 64* 57*  CREATININE 2.57* 2.25*  CALCIUM 8.3* 8.0*  AST 43*  --   ALT 15  --   ALKPHOS 106  --   BILITOT 1.0  --    ------------------------------------------------------------------------------------------------------------------  Cardiac Enzymes Recent Labs  Lab 12/07/17 1023  TROPONINI 0.07*   ------------------------------------------------------------------------------------------------------------------  RADIOLOGY:  Dg Chest 2 View  Result Date: 12/07/2017 CLINICAL DATA:  Weakness.  Hypertension. EXAM: CHEST - 2 VIEW COMPARISON:  November 01, 2017 FINDINGS: There is an equivocal left pleural effusion. There is no edema or consolidation. Heart is upper normal in size with pulmonary vascularity normal. No adenopathy. Bones are osteoporotic. There is degenerative change in the thoracic spine. There is aortic atherosclerosis. IMPRESSION: Equivocal left pleural effusion. No edema or consolidation. Stable cardiac silhouette. There is aortic atherosclerosis. Bones osteoporotic. Aortic Atherosclerosis (ICD10-I70.0). Electronically Signed   By: Lowella Grip III M.D.   On: 12/07/2017 11:18    EKG:   Orders placed or performed during the hospital encounter of 12/07/17  . EKG 12-Lead  . EKG 12-Lead  . EKG    ASSESSMENT AND PLAN:   84 year old male with past medical history of hypertension, hyperlipidemia, diabetes, anemia of chronic disease, atrial fibrillation who presents to the hospital due to progressive generalized weakness with poor appetite since  discharge from skilled nursing facility on Wednesday   *Acute kidney injury with chronic kidney disease stage III Most likely secondary to poor appetite/poor p.o. Intake in the setting of diuretic usage  IV fluids for rehydration, hold Lasix/Zaroxolyn for now Baseline creatinine 1.5 approximately creatinine 2.57-2.25. avoid nephrotoxic agents, strict I&O monitoring, daily weights  BMP in the morning  *Acute generalized weakness Most likely secondary to acute dehydration, poor appetite, and generalized deconditioning Physical therapy to evaluate/treat, fall precautions while in house-recommending home health PT  *Acute poor appetite Start Megace twice daily, encourage p.o. Intake Seen by dietitian and vitamin C is added to the regimen.  Liberalize diet Ensure Enlive dietary supplements twice daily   *Chronic left foot ulceration Requires daily dressing changes  wound care nurse following Will need to follow-up with wound clinic status post discharge for continued medical monitoring/care  * pressure injury-reposition patient  *Elevated troponin Most likely secondary to demand ischemia  we will continue to cycle cardiac enzymes Most recent echocardiogram noted fornormal ejection fraction with no wall motion abnormalities  *Chronic A. fib with episodes of bradycardia  Continue Eliquis   *Chronic diabetes mellitus type 2  Stable  Hold metformin, continue glipizide, Actos, sliding scale insulin with Accu-Cheks per routine   *Diastolic congestive heart failure without exacerbation Hold Lasix/Zaroxolyn for now stated above, continue to avoid beta-blocker therapy given history of chronic bradycardia, on Eliquis, statin therapy  *Chronic benign essential hypertension Stable on home regiment which will be continued  *Chronic hyperlipidemia  Stable Continue statin therapy  Disposition home with home health if clinically stable      All the records are reviewed  and case discussed with Care Management/Social Workerr. Management plans discussed with the patient, family and they are in agreement.  CODE STATUS: fc   TOTAL TIME TAKING CARE OF THIS PATIENT: 82  minutes.   POSSIBLE D/C IN 1-2  DAYS, DEPENDING ON CLINICAL CONDITION.  Note: This dictation was prepared with Dragon dictation along with smaller phrase technology. Any transcriptional errors that result from this process are unintentional.   Nicholes Mango M.D on 12/08/2017 at 4:21 PM  Between 7am to 6pm - Pager - [phone] After 6pm go to [email or web address] - password [email or web address] [facility]  Office  [phone]  CC: Primary care physician; [name], MD

## 2017-12-08 NOTE — Progress Notes (Signed)
Initial Nutrition Assessment  DOCUMENTATION CODES:   Obesity unspecified  INTERVENTION:   Ensure Enlive po BID, each supplement provides 350 kcal and 20 grams of protein  Ocuvite daily for wound healing (provides zinc, vitamin A, vitamin C, Vitamin E, copper, and selenium)  Vitamin C 229m po BID  Liberalize diet  Juven Fruit Punch BID, each serving provides 95kcal and 2.5g of protein (amino acids glutamine and arginine)  NUTRITION DIAGNOSIS:   Increased nutrient needs related to wound healing as evidenced by increased estimated needs.  GOAL:   Patient will meet greater than or equal to 90% of their needs  MONITOR:   PO intake, Supplement acceptance, Labs, Weight trends, Skin, I & O's  REASON FOR ASSESSMENT:   Consult Assessment of nutrition requirement/status  ASSESSMENT:   84year old male with past medical history of hypertension, hyperlipidemia, diabetes, anemia of chronic disease, atrial fibrillation who presents to the hospital due to progressive generalized weakness with poor appetite since discharge from skilled nursing facility on Wednesday    Met with pt and pt's daughter in room today. Pt reports decreased appetite and oral intake for the past month r/t he has been in a rehab facility for a shoulder injury. Pt reports his appetite and oral intake has been extremely poor for several days but reports that today his appetite has returned and he is "hungry as a bear". Pt ate 100% of his breakfast this morning. Pt has recently started drinking Ensure at home. Pt also takes a daily MVI, vitamin C (just started), vitamin D and iron daily at home. Per chart, pt has lost 16lbs(7%) over the past month which is significant. Pt reports his UBW is ~238lbs. Pt with ecchymosis and multiple wounds; will add vitamins and supplements to promote wound healing. Will also liberalize the heart healthy portion of pt's diet as this restricts protein also. Will provide Ensure as pt drinks  this at home; can change to Premier Protein if pt's blood sugars become elevated.       Medications reviewed and include: ferrous sulfate, insulin, megace, MVI, protonix, vitamin C  Labs reviewed: BUN 57(H), creat 2.25(H) cbgs- 56, 60, 141, 192, 151 x 24 hrs AIC 7.1(H)- 6/30  NUTRITION - FOCUSED PHYSICAL EXAM:    Most Recent Value  Orbital Region  No depletion  Upper Arm Region  No depletion  Thoracic and Lumbar Region  No depletion  Buccal Region  No depletion  Temple Region  No depletion  Clavicle Bone Region  No depletion  Clavicle and Acromion Bone Region  No depletion  Scapular Bone Region  No depletion  Dorsal Hand  No depletion  Patellar Region  No depletion  Anterior Thigh Region  No depletion  Posterior Calf Region  No depletion  Edema (RD Assessment)  Moderate [LLE]  Hair  Reviewed  Eyes  Reviewed  Mouth  Reviewed  Skin  Reviewed  Nails  Reviewed     Diet Order:   Diet Order           Diet heart healthy/carb modified Room service appropriate? Yes; Fluid consistency: Thin  Diet effective now         EDUCATION NEEDS:   Education needs have been addressed  Skin:  Skin Assessment: Reviewed RN Assessment(MASD, Stage II buttocks, Stage II head, diabetic foot ulcer )  Last BM:  8/4  Height:   Ht Readings from Last 1 Encounters:  12/07/17 _0  (1.727 m)    Weight:   Wt Readings from Last 1  Encounters:  12/08/17 224 lb 10.4 oz (101.9 kg)    Ideal Body Weight:  61.6 kg(adjusted for R AKA)  BMI:  Body mass index is 34.16 kg/m.  Estimated Nutritional Needs:   Kcal:  1900-2200kcal/day   Protein:  102-112g/day   Fluid:  >1.9L/day   Koleen Distance MS, RD, LDN Pager #- [phone] Office#- [phone] After Hours Pager: [phone]

## 2017-12-09 DIAGNOSIS — N179 Acute kidney failure, unspecified: Secondary | ICD-10-CM | POA: Diagnosis not present

## 2017-12-09 DIAGNOSIS — N183 Chronic kidney disease, stage 3 (moderate): Secondary | ICD-10-CM | POA: Diagnosis not present

## 2017-12-09 DIAGNOSIS — R531 Weakness: Secondary | ICD-10-CM | POA: Diagnosis not present

## 2017-12-09 DIAGNOSIS — R630 Anorexia: Secondary | ICD-10-CM | POA: Diagnosis not present

## 2017-12-09 LAB — BASIC METABOLIC PANEL
ANION GAP: 7 (ref 5–15)
BUN: 61 mg/dL — ABNORMAL HIGH (ref 8–23)
CHLORIDE: 100 mmol/L (ref 98–111)
CO2: 28 mmol/L (ref 22–32)
Calcium: 8.1 mg/dL — ABNORMAL LOW (ref 8.9–10.3)
Creatinine, Ser: 2.08 mg/dL — ABNORMAL HIGH (ref 0.61–1.24)
GFR calc non Af Amer: 27 mL/min — ABNORMAL LOW (ref >60)
GFR, EST AFRICAN AMERICAN: 32 mL/min — AB (ref >60)
Glucose, Bld: 233 mg/dL — ABNORMAL HIGH (ref 70–99)
POTASSIUM: 4.3 mmol/L (ref 3.5–5.1)
Sodium: 135 mmol/L (ref 135–145)

## 2017-12-09 LAB — GLUCOSE, CAPILLARY: Glucose-Capillary: 205 mg/dL — ABNORMAL HIGH (ref 70–99)

## 2017-12-09 MED ORDER — OCUVITE-LUTEIN PO CAPS: 1.0000 | ORAL_CAPSULE | Freq: Every day | ORAL | 0 refills | Status: DC

## 2017-12-09 MED ORDER — ENSURE ENLIVE PO LIQD: 237.0000 mL | Freq: Two times a day (BID) | ORAL | 2 refills | Status: DC

## 2017-12-09 MED ORDER — FUROSEMIDE 20 MG PO TABS: 20.0000 mg | ORAL_TABLET | Freq: Two times a day (BID) | ORAL | 0 refills | Status: DC

## 2017-12-09 MED ORDER — ACETAMINOPHEN 325 MG PO TABS: 650.0000 mg | ORAL_TABLET | Freq: Four times a day (QID) | ORAL | Status: DC | PRN

## 2017-12-09 MED ORDER — MEGESTROL ACETATE 400 MG/10ML PO SUSP: 400.0000 mg | Freq: Two times a day (BID) | ORAL | 0 refills | Status: DC

## 2017-12-09 MED ORDER — HYDROCODONE-ACETAMINOPHEN 5-325 MG PO TABS: 1.0000 | ORAL_TABLET | Freq: Four times a day (QID) | ORAL | 0 refills | Status: DC | PRN

## 2017-12-09 MED ORDER — JUVEN PO PACK: 1.0000 | PACK | Freq: Two times a day (BID) | ORAL | 0 refills | Status: AC

## 2017-12-09 NOTE — Discharge Summary (Signed)
[facility] [place] NAME: Miguel Torres    MR#:  [id number]  DATE OF BIRTH:  03-10-1931  DATE OF ADMISSION:  12/07/2017 ADMITTING PHYSICIAN: Gorden Harms, MD  DATE OF DISCHARGE:  12/08/17   PRIMARY CARE PHYSICIAN: [name], MD    ADMISSION DIAGNOSIS:  AKI (acute kidney injury) ([place]) [N17.9]  DISCHARGE DIAGNOSIS:  Active Problems:   Acute kidney injury ([place])   SECONDARY DIAGNOSIS:   Past Medical History:  Diagnosis Date  . Anemia   . Diabetes mellitus without complication ([place])   . Hyperlipidemia   . Hypertension   . Skin cancer     HOSPITAL COURSE:   HISTORY OF PRESENT ILLNESS: Miguel Torres  is a 84 y.o. male with a known history per below, last admission patient was discharged to skilled nursing facility-was discharged last Wednesday to home in care of family, her patient's daughter and patient-he has had progressive generalized weakness/fatigue, poor appetite, ER work-up noted for acute kidney injury with creatinine 2.5 baseline around 1.5, troponin 0.07, EKG noted for right bundle branch block/poor R wave progression/A. fib which is rate controlled, in the emergency room patient is without any complaint other than above, daughter at the bedside, patient now been admitted for acute kidney injury with chronic kidney disease stage III due to poor appetite and diuretic usage.   *Acute kidney injury with chronic kidney disease stage III Most likely secondary to poor appetite/poor p.o. Intakein the setting of diuretic usage IV fluids for rehydration, hold Lasix/Zaroxolyn for now Baseline creatinine 1.5 approximately creatinine 2.57-2.25.--2.08 avoid nephrotoxic agents, strict I&O monitoring, daily weights  *Acute generalized weakness Most likely secondary to acute dehydration, poor appetite, and generalized deconditioning Physical therapy to evaluate/treat, fall precautions while in house-recommending home  health PT, he has manager is following.  Will add  CHF vest as the patient is admitted 2 times in the past 6 months  *Acute poor appetite Started Megace twice daily, encourage p.o. Intake Seen by dietitian and vitamin C is added to the regimen.  Liberalize diet Ensure Enlive dietary supplements twice daily   *Chronic left foot ulceration Requires daily dressing changes  wound care nurse following Will need to follow-up with wound clinic status post discharge for continued medical monitoring/care  * pressure injury-reposition patient  *Elevated troponin Most likely secondary to demand ischemia  we will continue to cycle cardiac enzymes Most recent echocardiogram noted fornormal ejection fraction with no wall motion abnormalities  *Chronic A. fib with episodes of bradycardia  Continue Eliquis  *Chronic diabetes mellitus type 2  Stable  resume metformin, continue glipizide, Actos,  D/c sliding scale insulin with Accu-Cheks per routine  *Diastolic congestive heart failure without exacerbation Hold Zaroxolyn for now stated above, PCP can resume this during follow-up visit   resume Lasix from tomorrow  Monitor heart rate as he is on beta-blocker therapy given history of chronic bradycardia, on Eliquis, statin therapy.  Currently heart rate is at around 78  *Chronic benign essential hypertension Stable on home regiment which will be continued  *Chronic hyperlipidemia Stable Continue statin therapy  Disposition home with home health, case manager report states , patient has advanced home care orders for RN and PT already.  Will add CHF vest to home health as patient was admitted 2 times in the past 6 months to reduce the readmission rate   DISCHARGE CONDITIONS:   fair  CONSULTS OBTAINED:     PROCEDURES  None  DRUG ALLERGIES:  No Known Allergies  DISCHARGE MEDICATIONS:   Allergies as of 12/09/2017   No Known Allergies     Medication List    STOP  taking these medications   meloxicam 7.5 MG tablet Commonly known as:  MOBIC   metolazone 5 MG tablet Commonly known as:  ZAROXOLYN     TAKE these medications   acetaminophen 325 MG tablet Commonly known as:  TYLENOL Take 2 tablets (650 mg total) by mouth every 6 (six) hours as needed for mild pain (or Fever >/= 101).   apixaban 2.5 MG Tabs tablet Commonly known as:  ELIQUIS Take 1 tablet (2.5 mg total) by mouth 2 (two) times daily.   ascorbic acid 250 MG tablet Commonly known as:  VITAMIN C Take 250 mg by mouth daily.   Dakins 0.5 % Soln Apply 1 application topically daily. LEFT FOOT   felodipine 5 MG 24 hr tablet Commonly known as:  PLENDIL Take 1 tablet (5 mg total) by mouth daily.   ferrous sulfate 325 (65 FE) MG tablet Take 1 tablet by mouth daily.   furosemide 20 MG tablet Commonly known as:  LASIX Take 1 tablet (20 mg total) by mouth 2 (two) times daily. Start taking on:  12/10/2017 What changed:  when to take this   glipiZIDE 10 MG tablet Commonly known as:  GLUCOTROL TAKE 1 TABLET (10 MG TOTAL) BY MOUTH DAILY.   HYDROcodone-acetaminophen 5-325 MG tablet Commonly known as:  NORCO/VICODIN Take 1 tablet by mouth every 6 (six) hours as needed for moderate pain.   loratadine 10 MG tablet Commonly known as:  CLARITIN Take 10 mg by mouth daily.   lovastatin 40 MG tablet Commonly known as:  MEVACOR TAKE 1 TABLET (40 MG TOTAL) BY MOUTH DAILY.   megestrol 400 MG/10ML suspension Commonly known as:  MEGACE Take 10 mLs (400 mg total) by mouth 2 (two) times daily.   metFORMIN 500 MG 24 hr tablet Commonly known as:  GLUCOPHAGE-XR TAKE 1 TABLET BY MOUTH EVERY DAY   montelukast 10 MG tablet Commonly known as:  SINGULAIR TAKE 1 TABLET (10 MG TOTAL) BY MOUTH EVERY EVENING.   multivitamin tablet Take 1 tablet by mouth daily.   multivitamin-lutein Caps capsule Take 1 capsule by mouth daily.   nutrition supplement (JUVEN) Pack Take 1 packet by mouth 2 (two)  times daily between meals.   feeding supplement (ENSURE ENLIVE) Liqd Take 237 mLs by mouth 2 (two) times daily between meals.   nystatin powder Generic drug:  nystatin Apply 1 g topically 2 (two) times daily.   pantoprazole 40 MG tablet Commonly known as:  PROTONIX Take 40 mg by mouth daily.   pioglitazone 30 MG tablet Commonly known as:  ACTOS TAKE 1 TABLET (30 MG TOTAL) BY MOUTH DAILY.   polyethylene glycol packet Commonly known as:  MIRALAX / GLYCOLAX Take 17 g by mouth daily as needed for mild constipation.   Vitamin D (Ergocalciferol) 50000 units Caps capsule Commonly known as:  DRISDOL TAKE ONE CAPSULE BY MOUTH EVERY 7 DAYS        DISCHARGE INSTRUCTIONS:   Follow-up with primary care physician in 2 to 3 days, please repeat BMP Follow-up with nephrology in 5 to 7 days Resume home health with CHF vest Follow-up with primary cardiologist as recommended or in 1 to 2 weeks DIET:  Cardiac diet and Diabetic diet  DISCHARGE CONDITION:  Stable  ACTIVITY:  Activity as tolerated  OXYGEN:  Home Oxygen: No.  Oxygen Delivery: room air  DISCHARGE LOCATION:  home   If you experience worsening of your admission symptoms, develop shortness of breath, life threatening emergency, suicidal or homicidal thoughts you must seek medical attention immediately by calling 911 or calling your MD immediately  if symptoms less severe.  You Must read complete instructions/literature along with all the possible adverse reactions/side effects for all the Medicines you take and that have been prescribed to you. Take any new Medicines after you have completely understood and accpet all the possible adverse reactions/side effects.   Please note  You were cared for by a hospitalist during your hospital stay. If you have any questions about your discharge medications or the care you received while you were in the hospital after you are discharged, you can call the unit and asked to speak  with the hospitalist on call if the hospitalist that took care of you is not available. Once you are discharged, your primary care physician will handle any further medical issues. Please note that NO REFILLS for any discharge medications will be authorized once you are discharged, as it is imperative that you return to your primary care physician (or establish a relationship with a primary care physician if you do not have one) for your aftercare needs so that they can reassess your need for medications and monitor your lab values.     Today  Chief Complaint  Patient presents with  . Weakness   Patient is resting comfortably.  Denies any complaints.  Feels better.  Wants to go home.  ROS:  CONSTITUTIONAL: Denies fevers, chills. Denies any fatigue, weakness.  EYES: Denies blurry vision, double vision, eye pain. EARS, NOSE, THROAT: Denies tinnitus, ear pain, hearing loss. RESPIRATORY: Denies cough, wheeze, shortness of breath.  CARDIOVASCULAR: Denies chest pain, palpitations, edema.  GASTROINTESTINAL: Denies nausea, vomiting, diarrhea, abdominal pain. Denies bright red blood per rectum. GENITOURINARY: Denies dysuria, hematuria. ENDOCRINE: Denies nocturia or thyroid problems. HEMATOLOGIC AND LYMPHATIC: Denies easy bruising or bleeding. SKIN: Denies rash or lesion. MUSCULOSKELETAL: Denies pain in neck, back, shoulder, knees, hips or arthritic symptoms.  NEUROLOGIC: Denies paralysis, paresthesias.  PSYCHIATRIC: Denies anxiety or depressive symptoms.   VITAL SIGNS:  Blood pressure (!) 138/125, pulse 78, temperature 97.6 F (36.4 C), temperature source Oral, resp. rate 20, height 5\' 8"  (1.727 m), weight 104 kg (229 lb 4.5 oz), SpO2 97 %.  I/O:    Intake/Output Summary (Last 24 hours) at 12/09/2017 1031 Last data filed at 12/09/2017 0900 Gross per 24 hour  Intake 781 ml  Output 0 ml  Net 781 ml    PHYSICAL EXAMINATION:  GENERAL:  84 y.o.-year-old patient lying in the bed with no  acute distress.  EYES: Pupils equal, round, reactive to light and accommodation. No scleral icterus. Extraocular muscles intact.  HEENT: Head atraumatic, normocephalic. Oropharynx and nasopharynx clear.  NECK:  Supple, no jugular venous distention. No thyroid enlargement, no tenderness.  LUNGS: Normal breath sounds bilaterally, no wheezing, rales,rhonchi or crepitation. No use of accessory muscles of respiration.  CARDIOVASCULAR: S1, S2 normal. No murmurs, rubs, or gallops.  ABDOMEN: Soft, non-tender, non-distended. Bowel sounds present. No organomegaly or mass.  EXTREMITIES: No pedal edema, cyanosis, or clubbing.  Right BKA with prosthesis NEUROLOGIC: Cranial nerves II through XII are intact.  Sensation intact. Gait not checked.  PSYCHIATRIC: The patient is alert and oriented x 3.  SKIN: No obvious rash, lesion, or ulcer.   DATA REVIEW:   CBC Recent Labs  Lab 12/07/17 1023  WBC 6.4  HGB 10.4*  HCT 30.8*  PLT 147*    Chemistries  Recent Labs  Lab 12/07/17 1023  12/09/17 0331  NA 139   < > 135  K 3.9   < > 4.3  CL 99   < > 100  CO2 27   < > 28  GLUCOSE 76   < > 233*  BUN 64*   < > 61*  CREATININE 2.57*   < > 2.08*  CALCIUM 8.3*   < > 8.1*  AST 43*  --   --   ALT 15  --   --   ALKPHOS 106  --   --   BILITOT 1.0  --   --    < > = values in this interval not displayed.    Cardiac Enzymes Recent Labs  Lab 12/08/17 2222  TROPONINI 0.07*    Microbiology Results  Results for orders placed or performed in visit on 02/25/17  WOUND CULTURE     Status: Abnormal   Collection Time: 02/25/17  3:37 PM  Result Value Ref Range Status   MICRO NUMBER: [id number]  Final   SPECIMEN QUALITY: ADEQUATE  Final   SOURCE: LEG,LEFT  Final   STATUS: FINAL  Final   GRAM STAIN:   Final    Few White blood cells seen No epithelial cells seen Few Gram positive cocci in pairs   ISOLATE 1: methicillin resistant Staphylococcus aureus (A)  Final      Susceptibility   Methicillin resistant  staphylococcus aureus - AEROBIC CULT, GRAM STAIN POSITIVE 1    VANCOMYCIN 1 Sensitive     CIPROFLOXACIN >=8 Resistant     CLINDAMYCIN <=0.25 Sensitive     LEVOFLOXACIN 4 Resistant     ERYTHROMYCIN >=8 Resistant     GENTAMICIN <=0.5 Sensitive     OXACILLIN* NR Resistant      * Oxacillin-resistant staphylococci are resistant toall currently available beta-lactam antimicrobialagents including penicillins, beta lactam/beta-lactamase inhibitor combinations, and cephems withstaphylococcal indications, including Cefazolin.    TETRACYCLINE <=1 Sensitive     TRIMETH/SULFA* <=10 Sensitive      * Oxacillin-resistant staphylococci are resistant toall currently available beta-lactam antimicrobialagents including penicillins, beta lactam/beta-lactamase inhibitor combinations, and cephems withstaphylococcal indications, including Cefazolin.Legend:S = Susceptible  I = IntermediateR = Resistant  NS = Not susceptible* = Not tested  NR = Not reported**NN = See antimicrobic comments    RADIOLOGY:  Dg Chest 2 View  Result Date: 12/07/2017 CLINICAL DATA:  Weakness.  Hypertension. EXAM: CHEST - 2 VIEW COMPARISON:  November 01, 2017 FINDINGS: There is an equivocal left pleural effusion. There is no edema or consolidation. Heart is upper normal in size with pulmonary vascularity normal. No adenopathy. Bones are osteoporotic. There is degenerative change in the thoracic spine. There is aortic atherosclerosis. IMPRESSION: Equivocal left pleural effusion. No edema or consolidation. Stable cardiac silhouette. There is aortic atherosclerosis. Bones osteoporotic. Aortic Atherosclerosis (ICD10-I70.0). Electronically Signed   By: Lowella Grip III M.D.   On: 12/07/2017 11:18    EKG:   Orders placed or performed during the hospital encounter of 12/07/17  . EKG 12-Lead  . EKG 12-Lead  . EKG      Management plans discussed with the patient, family and they are in agreement.  CODE STATUS:     Code Status Orders  (From  admission, onward)        Start     Ordered   12/07/17 1414  Full code  Continuous  12/07/17 1424    Code Status History    Date Active Date Inactive Code Status Order ID Comments User Context   11/01/2017 1358 11/04/2017 1719 Full Code [id number]  Gladstone Lighter, MD Inpatient    Advance Directive Documentation     Most Recent Value  Type of Advance Directive  Healthcare Power of Attorney, Living will, Out of facility DNR (pink MOST or yellow form)  Pre-existing out of facility DNR order (yellow form or pink MOST form)  Physician notified to receive inpatient order  "MOST" Form in Place?  -      TOTAL TIME TAKING CARE OF THIS PATIENT: 43 minutes.   Note: This dictation was prepared with Dragon dictation along with smaller phrase technology. Any transcriptional errors that result from this process are unintentional.   @[facility] @  on 12/09/2017 at 10:31 AM  Between 7am to 6pm - Pager - [phone]  After 6pm go to [email or web address] - password [email or web address] [facility]  Office  [phone]  CC: Primary care physician; [name], MD

## 2017-12-09 NOTE — Care Management Obs Status (Signed)
[facility] NOTIFICATION   Patient Details  Name: TREVIS EDEN MRN: [id number] Date of Birth: 12-24-1930   Medicare Observation Status Notification Given:  Yes    Beverly Sessions, RN 12/09/2017, 10:46 AM

## 2017-12-09 NOTE — Care Management Note (Signed)
Case Management Note  Patient Details  Name: Miguel Torres MRN: [id number] Date of Birth: [birth year]   Patient to discharge home today. Daughter at bedside.  Patient lives at home alone, family lives locally for support and transportation. PCP [name]. [facility] already has orders from home health from the community. MD felt patient would be appropriate for the REDs vest through [facility].  Daughter elected to continue services with a.hc.  [place] Failure protocol was signed by MD and provided to [facility] with [facility].  RNCM signing off.   Subjective/Objective:                    Action/Plan:   Expected Discharge Date:  12/09/17               Expected Discharge Plan:  [place]  In-House Referral:     Discharge planning Services  CM Consult  Post Acute Care Choice:  Home Health, Resumption of Svcs/PTA Provider Choice offered to:  Patient, Adult Children  DME Arranged:    DME Agency:     HH Arranged:  RN, PT [place] Agency:  [facility]  Status of Service:  Completed, signed off  If discussed at [place] of Stay Meetings, dates discussed:    Additional Comments:  Beverly Sessions, RN 12/09/2017, 11:06 AM

## 2017-12-09 NOTE — Discharge Instructions (Signed)
Follow-up with primary care physician in 2 to 3 days Follow-up with nephrology in 5 to 7 days Resume home health, add CHF vest Follow-up with primary cardiology as recommended or in 1 to 2 weeks

## 2017-12-10 ENCOUNTER — Inpatient Hospital Stay: Payer: Medicare Other | Admitting: Family Medicine

## 2017-12-10 ENCOUNTER — Telehealth: Payer: Self-pay

## 2017-12-10 NOTE — Telephone Encounter (Signed)
Transition Care Management Follow-up Telephone Call  Date of discharge and from where: [facility] on 12/09/17  How have you been since you were released from the hospital? Doing well, appetite is back. Declines weakness or fatigue. No other s/s.  Any questions or concerns? No   Items Reviewed:  Did the pt receive and understand the discharge instructions provided? Yes   Medications obtained and verified? Only verified the changes. Daughter to verify the rest at hospital f/u apt.  Any new allergies since your discharge? No   Dietary orders reviewed? N/A  Do you have support at home? Yes   Other (ie: DME, Home Health, etc) N/A  Functional Questionnaire: (I = Independent and D = Dependent) ADL's: Uses a walker to aid in walking.   Bathing/Dressing- I   Meal Prep- I  Eating- I  Maintaining continence- I  Transferring/Ambulation- Uses a walker for assistance.  Managing Meds- D, family manages medication.   Follow up appointments reviewed:    PCP Hospital f/u appt confirmed? Yes  Scheduled to see Dr. Caryn Section on 12/16/17 @ 11:00 AM.  [facility] f/u appt confirmed? Yes  Scheduled to see Christell Faith on 12/21/17 @ 1:30 PM.  Are transportation arrangements needed? No   If their condition worsens, is the pt aware to call  their PCP or go to the ED? Yes  Was the patient provided with contact information for the PCP's office or ED? Yes  Was the pt encouraged to call back with questions or concerns? Yes

## 2017-12-12 DIAGNOSIS — D649 Anemia, unspecified: Secondary | ICD-10-CM | POA: Diagnosis not present

## 2017-12-12 DIAGNOSIS — L97522 Non-pressure chronic ulcer of other part of left foot with fat layer exposed: Secondary | ICD-10-CM | POA: Diagnosis not present

## 2017-12-12 DIAGNOSIS — E11621 Type 2 diabetes mellitus with foot ulcer: Secondary | ICD-10-CM | POA: Diagnosis not present

## 2017-12-12 DIAGNOSIS — I130 Hypertensive heart and chronic kidney disease with heart failure and stage 1 through stage 4 chronic kidney disease, or unspecified chronic kidney disease: Secondary | ICD-10-CM | POA: Diagnosis not present

## 2017-12-12 DIAGNOSIS — M75121 Complete rotator cuff tear or rupture of right shoulder, not specified as traumatic: Secondary | ICD-10-CM | POA: Diagnosis not present

## 2017-12-12 DIAGNOSIS — E785 Hyperlipidemia, unspecified: Secondary | ICD-10-CM | POA: Diagnosis not present

## 2017-12-12 DIAGNOSIS — I482 Chronic atrial fibrillation: Secondary | ICD-10-CM | POA: Diagnosis not present

## 2017-12-12 DIAGNOSIS — M1990 Unspecified osteoarthritis, unspecified site: Secondary | ICD-10-CM | POA: Diagnosis not present

## 2017-12-12 DIAGNOSIS — Z85828 Personal history of other malignant neoplasm of skin: Secondary | ICD-10-CM | POA: Diagnosis not present

## 2017-12-12 DIAGNOSIS — Z89511 Acquired absence of right leg below knee: Secondary | ICD-10-CM | POA: Diagnosis not present

## 2017-12-12 DIAGNOSIS — I4892 Unspecified atrial flutter: Secondary | ICD-10-CM | POA: Diagnosis not present

## 2017-12-12 DIAGNOSIS — Z7984 Long term (current) use of oral hypoglycemic drugs: Secondary | ICD-10-CM | POA: Diagnosis not present

## 2017-12-12 DIAGNOSIS — N183 Chronic kidney disease, stage 3 (moderate): Secondary | ICD-10-CM | POA: Diagnosis not present

## 2017-12-12 DIAGNOSIS — E1122 Type 2 diabetes mellitus with diabetic chronic kidney disease: Secondary | ICD-10-CM | POA: Diagnosis not present

## 2017-12-12 DIAGNOSIS — E559 Vitamin D deficiency, unspecified: Secondary | ICD-10-CM | POA: Diagnosis not present

## 2017-12-12 DIAGNOSIS — I5032 Chronic diastolic (congestive) heart failure: Secondary | ICD-10-CM | POA: Diagnosis not present

## 2017-12-13 ENCOUNTER — Other Ambulatory Visit: Payer: Self-pay | Admitting: Family Medicine

## 2017-12-14 ENCOUNTER — Telehealth: Payer: Self-pay

## 2017-12-14 NOTE — Telephone Encounter (Signed)
That's fine

## 2017-12-14 NOTE — Progress Notes (Signed)
Patient: Miguel Torres Male    DOB: [birth year]   84 y.o.   MRN: [id number] Visit Date: 12/16/2017  Today's Provider: Lelon Huh, MD   Chief Complaint  Patient presents with  . Hospitalization Follow-up   Subjective:    HPI   Follow up Hospitalization  Patient was admitted to [facility] on 12/07/2017 and discharged on 12/09/2017. He was treated for AKI. Treatment for this included giving patient IV fluids for rehydration. Lasix and Zaroxolyn was placed on hold. Home health and physical therapy was recommneded. Lasix was resumed before patient was discharged. CHF vest was added. Per discharge summary, patient was to follow up with her PCP in 2-3 days to have BMP repeated. Patient was to also follow up with Nephrology in 5-7 days, and Cardiologist in 1-2 weeks.  He reports good compliance with treatment. He reports this condition is Worse.  He states that he has been much more short of breath today and cannot get out of wheelchair to ambulate. He feels much weaker today.  Was noted by home health nurse to have oximetry of 83% today. He is now having worsening swelling in scrotum and leg. ------------------------------------------------------------------------------------   No Known Allergies   Current Outpatient Medications:  .  apixaban (ELIQUIS) 2.5 MG TABS tablet, Take 1 tablet (2.5 mg total) by mouth 2 (two) times daily., Disp: 60 tablet, Rfl:  .  ascorbic acid (VITAMIN C) 250 MG tablet, Take 250 mg by mouth daily., Disp: , Rfl:  .  atenolol-chlorthalidone (TENORETIC) 50-25 MG tablet, Take 1 tablet by mouth daily., Disp: , Rfl:  .  feeding supplement, ENSURE ENLIVE, (ENSURE ENLIVE) LIQD, Take 237 mLs by mouth 2 (two) times daily between meals., Disp: 60 Bottle, Rfl: 2 .  felodipine (PLENDIL) 5 MG 24 hr tablet, Take 1 tablet (5 mg total) by mouth daily., Disp: , Rfl:  .  furosemide (LASIX) 20 MG tablet, Take 1 tablet (20 mg total) by mouth 2 (two) times daily., Disp: 60  tablet, Rfl: 0 .  glipiZIDE (GLUCOTROL) 10 MG tablet, TAKE 1 TABLET (10 MG TOTAL) BY MOUTH DAILY., Disp: 90 tablet, Rfl: 4 .  HYDROcodone-acetaminophen (NORCO/VICODIN) 5-325 MG tablet, Take 1 tablet by mouth every 6 (six) hours as needed for moderate pain., Disp: 15 tablet, Rfl: 0 .  loratadine (CLARITIN) 10 MG tablet, Take 10 mg by mouth daily., Disp: , Rfl:  .  lovastatin (MEVACOR) 40 MG tablet, TAKE 1 TABLET (40 MG TOTAL) BY MOUTH DAILY., Disp: 90 tablet, Rfl: 3 .  metFORMIN (GLUCOPHAGE-XR) 500 MG 24 hr tablet, TAKE 1 TABLET BY MOUTH EVERY DAY, Disp: 90 tablet, Rfl: 4 .  metolazone (ZAROXOLYN) 5 MG tablet, Take 5 mg by mouth daily., Disp: , Rfl:  .  montelukast (SINGULAIR) 10 MG tablet, TAKE 1 TABLET (10 MG TOTAL) BY MOUTH EVERY EVENING., Disp: 90 tablet, Rfl: 4 .  Multiple Vitamin (MULTIVITAMIN) tablet, Take 1 tablet by mouth daily., Disp: , Rfl:  .  nutrition supplement, JUVEN, (JUVEN) PACK, Take 1 packet by mouth 2 (two) times daily between meals., Disp: 60 packet, Rfl: 0 .  pioglitazone (ACTOS) 30 MG tablet, TAKE 1 TABLET (30 MG TOTAL) BY MOUTH DAILY., Disp: 90 tablet, Rfl: 0 .  Vitamin D, Ergocalciferol, (DRISDOL) 50000 units CAPS capsule, TAKE ONE CAPSULE BY MOUTH EVERY 7 DAYS, Disp: 4 capsule, Rfl: 0 .  Zinc Sulfate (ZINC-220 PO), Take 1 tablet by mouth daily., Disp: , Rfl:  .  acetaminophen (TYLENOL) 325 MG tablet,  Take 2 tablets (650 mg total) by mouth every 6 (six) hours as needed for mild pain (or Fever >/= 101). (Patient not taking: Reported on 12/16/2017), Disp: , Rfl:  .  Dakins 0.5 % SOLN, Apply 1 application topically daily. LEFT FOOT, Disp: , Rfl:  .  ferrous sulfate 325 (65 FE) MG tablet, Take 1 tablet by mouth daily., Disp: , Rfl:  .  megestrol (MEGACE) 400 MG/10ML suspension, Take 10 mLs (400 mg total) by mouth 2 (two) times daily. (Patient not taking: Reported on 12/16/2017), Disp: 240 mL, Rfl: 0 .  multivitamin-lutein (OCUVITE-LUTEIN) CAPS capsule, Take 1 capsule by mouth  daily. (Patient not taking: Reported on 12/16/2017), Disp: 30 capsule, Rfl: 0 .  nystatin (NYSTATIN) powder, Apply 1 g topically 2 (two) times daily., Disp: , Rfl:  .  pantoprazole (PROTONIX) 40 MG tablet, Take 40 mg by mouth daily., Disp: , Rfl:  .  polyethylene glycol (MIRALAX / GLYCOLAX) packet, Take 17 g by mouth daily as needed for mild constipation. , Disp: , Rfl:   Review of Systems  Constitutional: Positive for fatigue. Negative for appetite change, chills and fever.  Respiratory: Positive for shortness of breath. Negative for chest tightness and wheezing.   Cardiovascular: Positive for leg swelling. Negative for chest pain and palpitations.  Gastrointestinal: Negative for abdominal pain, nausea and vomiting.  Genitourinary: Positive for dysuria.       Swelling of the groin  Musculoskeletal: Positive for arthralgias and joint swelling.  Neurological: Positive for weakness.    Social History   Tobacco Use  . Smoking status: Never Smoker  . Smokeless tobacco: Never Used  Substance Use Topics  . Alcohol use: No   Objective:    Vitals:   12/16/17 1105 12/16/17 1114  BP: (!) 160/81 (!) 142/63  Pulse: (!) 50   Resp: (!) 24   Temp: (!) 97.5 F (36.4 C)   TempSrc: Oral   SpO2: 95%      Physical Exam   General Appearance:    Ill appearing male, Alert, increased rate of breathing, otherwise in  no distress  Eyes:    PERRL, conjunctiva/corneas clear, EOM's intact       Lungs:     Clear to auscultation bilaterally, respirations labored  Heart:    Bradycardiac with occasional premature beat, 3+ edema left leg. Prosthetic right leg.   Neurologic:   Awake, alert, oriented x 3. No apparent focal neurological           defect.       EKG: a-fib/flutter, bradycardia rate=45. Non-specific T-wave abnormality.     Assessment & Plan:     1. Bradycardia  - EKG 12-Lead  2. Atrial flutter Consider changing betablocker for more consistent rate control.   3. Acute diastolic  congestive heart failure ([place])  4. CKD (chronic kidney disease) stage 4, GFR 15-29 ml/min (HCC) Should establish with nephrology  5. Diabetes mellitus with nephropathy ([place]) Consider d/c pioglitazone due to recent cardiac disease. May need insulin to control sugars.   Patient reports dramatic worsening of symptoms over the last 24 hours with hypoxia noted by home health nurse this morning. Oximetry improved in office, but he is not able to exert himself at all.  Considering his rapid decline will send to ER for urgent evaluation, likely need for IV diuresis and possible need for admission.  Anticipated need to be followed by nephrology. He does have cardiology appointment scheduled next week.        Lelon Huh,  MD  [place]

## 2017-12-14 NOTE — Telephone Encounter (Signed)
Marijean Heath with [facility] is requesting verbal orders for a plan of care. 1 week 1, 2 weeks 2, 1 week 3, 1 time every other week for 3 weeks and 3 PRN visits. Becky Sax also wanted to report that hospital requested patient have a home health aide and he declined. [phone]

## 2017-12-14 NOTE — Telephone Encounter (Signed)
Please advise 

## 2017-12-14 NOTE — Telephone Encounter (Signed)
Left detailed message on Sonja's vm giving verbal order.

## 2017-12-16 ENCOUNTER — Encounter: Payer: Self-pay | Admitting: Emergency Medicine

## 2017-12-16 ENCOUNTER — Telehealth: Payer: Self-pay | Admitting: Family Medicine

## 2017-12-16 ENCOUNTER — Other Ambulatory Visit: Payer: Self-pay

## 2017-12-16 ENCOUNTER — Emergency Department: Payer: Medicare Other

## 2017-12-16 ENCOUNTER — Encounter: Payer: Self-pay | Admitting: Family Medicine

## 2017-12-16 ENCOUNTER — Inpatient Hospital Stay
Admission: EM | Admit: 2017-12-16 | Discharge: 2017-12-21 | DRG: 291 | Disposition: A | Discharge status: Skilled Nursing Facility | Payer: Medicare Other | Attending: Internal Medicine | Admitting: Internal Medicine

## 2017-12-16 ENCOUNTER — Ambulatory Visit (INDEPENDENT_AMBULATORY_CARE_PROVIDER_SITE_OTHER): Payer: Medicare Other | Admitting: Family Medicine

## 2017-12-16 VITALS — BP 142/63 | HR 50 | Temp 97.5°F | Resp 24

## 2017-12-16 DIAGNOSIS — Z515 Encounter for palliative care: Secondary | ICD-10-CM

## 2017-12-16 DIAGNOSIS — E11621 Type 2 diabetes mellitus with foot ulcer: Secondary | ICD-10-CM | POA: Diagnosis present

## 2017-12-16 DIAGNOSIS — I4892 Unspecified atrial flutter: Secondary | ICD-10-CM

## 2017-12-16 DIAGNOSIS — R001 Bradycardia, unspecified: Secondary | ICD-10-CM

## 2017-12-16 DIAGNOSIS — R7989 Other specified abnormal findings of blood chemistry: Secondary | ICD-10-CM | POA: Diagnosis not present

## 2017-12-16 DIAGNOSIS — N442 Benign cyst of testis: Secondary | ICD-10-CM | POA: Diagnosis present

## 2017-12-16 DIAGNOSIS — I482 Chronic atrial fibrillation: Secondary | ICD-10-CM | POA: Diagnosis present

## 2017-12-16 DIAGNOSIS — I5031 Acute diastolic (congestive) heart failure: Secondary | ICD-10-CM

## 2017-12-16 DIAGNOSIS — M75121 Complete rotator cuff tear or rupture of right shoulder, not specified as traumatic: Secondary | ICD-10-CM | POA: Diagnosis not present

## 2017-12-16 DIAGNOSIS — M7989 Other specified soft tissue disorders: Secondary | ICD-10-CM | POA: Diagnosis not present

## 2017-12-16 DIAGNOSIS — N184 Chronic kidney disease, stage 4 (severe): Secondary | ICD-10-CM | POA: Diagnosis not present

## 2017-12-16 DIAGNOSIS — R339 Retention of urine, unspecified: Secondary | ICD-10-CM | POA: Diagnosis not present

## 2017-12-16 DIAGNOSIS — I110 Hypertensive heart disease with heart failure: Secondary | ICD-10-CM | POA: Diagnosis not present

## 2017-12-16 DIAGNOSIS — N433 Hydrocele, unspecified: Secondary | ICD-10-CM | POA: Diagnosis present

## 2017-12-16 DIAGNOSIS — E1122 Type 2 diabetes mellitus with diabetic chronic kidney disease: Secondary | ICD-10-CM | POA: Diagnosis present

## 2017-12-16 DIAGNOSIS — E785 Hyperlipidemia, unspecified: Secondary | ICD-10-CM | POA: Diagnosis not present

## 2017-12-16 DIAGNOSIS — I5033 Acute on chronic diastolic (congestive) heart failure: Secondary | ICD-10-CM | POA: Diagnosis present

## 2017-12-16 DIAGNOSIS — Z7984 Long term (current) use of oral hypoglycemic drugs: Secondary | ICD-10-CM

## 2017-12-16 DIAGNOSIS — Z85828 Personal history of other malignant neoplasm of skin: Secondary | ICD-10-CM

## 2017-12-16 DIAGNOSIS — I509 Heart failure, unspecified: Secondary | ICD-10-CM | POA: Diagnosis not present

## 2017-12-16 DIAGNOSIS — Z89511 Acquired absence of right leg below knee: Secondary | ICD-10-CM

## 2017-12-16 DIAGNOSIS — E119 Type 2 diabetes mellitus without complications: Secondary | ICD-10-CM | POA: Diagnosis not present

## 2017-12-16 DIAGNOSIS — E782 Mixed hyperlipidemia: Secondary | ICD-10-CM | POA: Diagnosis not present

## 2017-12-16 DIAGNOSIS — I130 Hypertensive heart and chronic kidney disease with heart failure and stage 1 through stage 4 chronic kidney disease, or unspecified chronic kidney disease: Secondary | ICD-10-CM | POA: Diagnosis not present

## 2017-12-16 DIAGNOSIS — R0902 Hypoxemia: Secondary | ICD-10-CM | POA: Diagnosis present

## 2017-12-16 DIAGNOSIS — E1121 Type 2 diabetes mellitus with diabetic nephropathy: Secondary | ICD-10-CM

## 2017-12-16 DIAGNOSIS — N5089 Other specified disorders of the male genital organs: Secondary | ICD-10-CM | POA: Diagnosis present

## 2017-12-16 DIAGNOSIS — Z7189 Other specified counseling: Secondary | ICD-10-CM

## 2017-12-16 DIAGNOSIS — L97522 Non-pressure chronic ulcer of other part of left foot with fat layer exposed: Secondary | ICD-10-CM | POA: Diagnosis not present

## 2017-12-16 DIAGNOSIS — Z79899 Other long term (current) drug therapy: Secondary | ICD-10-CM

## 2017-12-16 DIAGNOSIS — R0989 Other specified symptoms and signs involving the circulatory and respiratory systems: Secondary | ICD-10-CM | POA: Diagnosis not present

## 2017-12-16 DIAGNOSIS — R338 Other retention of urine: Secondary | ICD-10-CM | POA: Diagnosis present

## 2017-12-16 DIAGNOSIS — L97529 Non-pressure chronic ulcer of other part of left foot with unspecified severity: Secondary | ICD-10-CM | POA: Diagnosis present

## 2017-12-16 DIAGNOSIS — R609 Edema, unspecified: Secondary | ICD-10-CM

## 2017-12-16 DIAGNOSIS — I5032 Chronic diastolic (congestive) heart failure: Secondary | ICD-10-CM | POA: Diagnosis not present

## 2017-12-16 DIAGNOSIS — Z7901 Long term (current) use of anticoagulants: Secondary | ICD-10-CM

## 2017-12-16 DIAGNOSIS — Z833 Family history of diabetes mellitus: Secondary | ICD-10-CM

## 2017-12-16 DIAGNOSIS — Z66 Do not resuscitate: Secondary | ICD-10-CM | POA: Diagnosis present

## 2017-12-16 DIAGNOSIS — N183 Chronic kidney disease, stage 3 (moderate): Secondary | ICD-10-CM | POA: Diagnosis not present

## 2017-12-16 DIAGNOSIS — N401 Enlarged prostate with lower urinary tract symptoms: Secondary | ICD-10-CM | POA: Diagnosis present

## 2017-12-16 HISTORY — DX: Heart failure, unspecified: I50.9

## 2017-12-16 LAB — CBC
HCT: 30 % — ABNORMAL LOW (ref 40.0–52.0)
Hemoglobin: 10.1 g/dL — ABNORMAL LOW (ref 13.0–18.0)
MCH: 31.8 pg (ref 26.0–34.0)
MCHC: 33.8 g/dL (ref 32.0–36.0)
MCV: 94.1 fL (ref 80.0–100.0)
PLATELETS: 169 10*3/uL (ref 150–440)
RBC: 3.18 MIL/uL — AB (ref 4.40–5.90)
RDW: 15.9 % — ABNORMAL HIGH (ref 11.5–14.5)
WBC: 6.4 10*3/uL (ref 3.8–10.6)

## 2017-12-16 LAB — URINALYSIS, COMPLETE (UACMP) WITH MICROSCOPIC
Bacteria, UA: NONE SEEN
Bilirubin Urine: NEGATIVE
Glucose, UA: NEGATIVE mg/dL
HGB URINE DIPSTICK: NEGATIVE
KETONES UR: NEGATIVE mg/dL
Leukocytes, UA: NEGATIVE
NITRITE: NEGATIVE
PH: 8 (ref 5.0–8.0)
PROTEIN: 100 mg/dL — AB
Specific Gravity, Urine: 1.013 (ref 1.005–1.030)

## 2017-12-16 LAB — BASIC METABOLIC PANEL
ANION GAP: 9 (ref 5–15)
BUN: 41 mg/dL — ABNORMAL HIGH (ref 8–23)
CALCIUM: 8.5 mg/dL — AB (ref 8.9–10.3)
CO2: 25 mmol/L (ref 22–32)
CREATININE: 1.45 mg/dL — AB (ref 0.61–1.24)
Chloride: 105 mmol/L (ref 98–111)
GFR, EST AFRICAN AMERICAN: 49 mL/min — AB (ref >60)
GFR, EST NON AFRICAN AMERICAN: 42 mL/min — AB (ref >60)
Glucose, Bld: 168 mg/dL — ABNORMAL HIGH (ref 70–99)
Potassium: 4.6 mmol/L (ref 3.5–5.1)
SODIUM: 139 mmol/L (ref 135–145)

## 2017-12-16 LAB — TROPONIN I
TROPONIN I: 0.06 ng/mL — AB (ref <0.03)
Troponin I: 0.06 ng/mL (ref <0.03)

## 2017-12-16 LAB — GLUCOSE, CAPILLARY: Glucose-Capillary: 179 mg/dL — ABNORMAL HIGH (ref 70–99)

## 2017-12-16 LAB — BRAIN NATRIURETIC PEPTIDE: B NATRIURETIC PEPTIDE 5: 1305 pg/mL — AB (ref 0.0–100.0)

## 2017-12-16 MED ORDER — PRAVASTATIN SODIUM 40 MG PO TABS
40.0000 mg | ORAL_TABLET | Freq: Every day | ORAL | Status: DC
  Administered 2017-12-17 – 2017-12-21 (×5): 40 mg via ORAL
  Filled 2017-12-16 (×5): qty 1

## 2017-12-16 MED ORDER — PANTOPRAZOLE SODIUM 40 MG PO TBEC
40.0000 mg | DELAYED_RELEASE_TABLET | Freq: Every day | ORAL | Status: DC
  Administered 2017-12-17 – 2017-12-21 (×5): 40 mg via ORAL
  Filled 2017-12-16 (×5): qty 1

## 2017-12-16 MED ORDER — ZINC SULFATE 220 (50 ZN) MG PO CAPS
220.0000 mg | ORAL_CAPSULE | Freq: Every day | ORAL | Status: DC
  Administered 2017-12-17 – 2017-12-21 (×5): 220 mg via ORAL
  Filled 2017-12-16 (×5): qty 1

## 2017-12-16 MED ORDER — APIXABAN 2.5 MG PO TABS
2.5000 mg | ORAL_TABLET | Freq: Two times a day (BID) | ORAL | Status: DC
  Administered 2017-12-16 – 2017-12-21 (×10): 2.5 mg via ORAL
  Filled 2017-12-16 (×10): qty 1

## 2017-12-16 MED ORDER — DOCUSATE SODIUM 100 MG PO CAPS: 100.0000 mg | ORAL_CAPSULE | Freq: Two times a day (BID) | ORAL | Status: DC | PRN

## 2017-12-16 MED ORDER — JUVEN PO PACK
1.0000 | PACK | Freq: Two times a day (BID) | ORAL | Status: DC
Start: 2017-12-17 — End: 2017-12-21
  Administered 2017-12-17 – 2017-12-21 (×8): 1 via ORAL

## 2017-12-16 MED ORDER — INSULIN ASPART 100 UNIT/ML ~~LOC~~ SOLN
0.0000 [IU] | Freq: Three times a day (TID) | SUBCUTANEOUS | Status: DC
  Administered 2017-12-16: 3 [IU] via SUBCUTANEOUS
  Administered 2017-12-17: 2 [IU] via SUBCUTANEOUS
  Administered 2017-12-17 (×2): 1 [IU] via SUBCUTANEOUS
  Administered 2017-12-18: 5 [IU] via SUBCUTANEOUS
  Administered 2017-12-19 – 2017-12-20 (×2): 1 [IU] via SUBCUTANEOUS
  Administered 2017-12-20: 5 [IU] via SUBCUTANEOUS
  Administered 2017-12-20: 3 [IU] via SUBCUTANEOUS
  Administered 2017-12-21: 2 [IU] via SUBCUTANEOUS
  Filled 2017-12-16 (×10): qty 1

## 2017-12-16 MED ORDER — MONTELUKAST SODIUM 10 MG PO TABS
5.0000 mg | ORAL_TABLET | Freq: Every day | ORAL | Status: DC
  Administered 2017-12-16 – 2017-12-20 (×5): 5 mg via ORAL
  Filled 2017-12-16 (×5): qty 1

## 2017-12-16 MED ORDER — GLIPIZIDE 5 MG PO TABS
5.0000 mg | ORAL_TABLET | Freq: Every day | ORAL | Status: DC
  Administered 2017-12-17 – 2017-12-18 (×2): 5 mg via ORAL
  Filled 2017-12-16 (×2): qty 1

## 2017-12-16 MED ORDER — ACETAMINOPHEN 325 MG PO TABS: 650.0000 mg | ORAL_TABLET | Freq: Four times a day (QID) | ORAL | Status: DC | PRN

## 2017-12-16 MED ORDER — FUROSEMIDE 10 MG/ML IJ SOLN
20.0000 mg | Freq: Three times a day (TID) | INTRAMUSCULAR | Status: DC
  Administered 2017-12-16 – 2017-12-18 (×5): 20 mg via INTRAVENOUS
  Filled 2017-12-16 (×5): qty 2

## 2017-12-16 MED ORDER — POLYETHYLENE GLYCOL 3350 17 G PO PACK: 17.0000 g | PACK | Freq: Every day | ORAL | Status: DC | PRN

## 2017-12-16 MED ORDER — ENSURE ENLIVE PO LIQD
237.0000 mL | Freq: Two times a day (BID) | ORAL | Status: DC
  Administered 2017-12-17 – 2017-12-21 (×9): 237 mL via ORAL

## 2017-12-16 MED ORDER — FUROSEMIDE 10 MG/ML IJ SOLN
40.0000 mg | Freq: Once | INTRAMUSCULAR | Status: AC
  Administered 2017-12-16: 40 mg via INTRAVENOUS
  Filled 2017-12-16: qty 4

## 2017-12-16 MED ORDER — VITAMIN C 500 MG PO TABS
250.0000 mg | ORAL_TABLET | Freq: Every day | ORAL | Status: DC
  Administered 2017-12-17 – 2017-12-21 (×5): 250 mg via ORAL
  Filled 2017-12-16 (×5): qty 0.5

## 2017-12-16 MED ORDER — FELODIPINE ER 5 MG PO TB24
5.0000 mg | ORAL_TABLET | Freq: Every day | ORAL | Status: DC
  Administered 2017-12-17 – 2017-12-21 (×5): 5 mg via ORAL
  Filled 2017-12-16 (×5): qty 1

## 2017-12-16 MED ORDER — METFORMIN HCL ER 500 MG PO TB24
500.0000 mg | ORAL_TABLET | Freq: Every day | ORAL | Status: DC
  Administered 2017-12-17: 500 mg via ORAL
  Filled 2017-12-16 (×2): qty 1

## 2017-12-16 MED ORDER — ADULT MULTIVITAMIN W/MINERALS CH
1.0000 | ORAL_TABLET | Freq: Every day | ORAL | Status: DC
Start: 2017-12-17 — End: 2017-12-21
  Administered 2017-12-17 – 2017-12-20 (×4): 1 via ORAL
  Filled 2017-12-16 (×4): qty 1

## 2017-12-16 MED ORDER — PIOGLITAZONE HCL 15 MG PO TABS: 15.0000 mg | ORAL_TABLET | Freq: Every day | ORAL | Status: DC

## 2017-12-16 MED ORDER — NYSTATIN 100000 UNIT/GM EX POWD: 1.0000 g | Freq: Two times a day (BID) | CUTANEOUS | Status: DC

## 2017-12-16 MED ORDER — FERROUS SULFATE 325 (65 FE) MG PO TABS
325.0000 mg | ORAL_TABLET | Freq: Every day | ORAL | Status: DC
  Administered 2017-12-17 – 2017-12-21 (×5): 325 mg via ORAL
  Filled 2017-12-16 (×5): qty 1

## 2017-12-16 MED ORDER — HYDROCODONE-ACETAMINOPHEN 5-325 MG PO TABS
1.0000 | ORAL_TABLET | Freq: Four times a day (QID) | ORAL | Status: DC | PRN
  Administered 2017-12-18: 1 via ORAL
  Filled 2017-12-16: qty 1

## 2017-12-16 NOTE — ED Notes (Signed)
primedoc in with pt for admission.  

## 2017-12-16 NOTE — ED Notes (Signed)
Spoke with pt about wait times and what to expect next. Advised pt that I am available for further questions if needed.  

## 2017-12-16 NOTE — ED Notes (Signed)
Resumed care from [place] rn.  Pt alert .  md at bedside.  nsr on monitor.  Skin warm and dry.  Family with pt.

## 2017-12-16 NOTE — Progress Notes (Signed)
Blood sugar was 193 per NT, will received 2 units of Novolog Insulin

## 2017-12-16 NOTE — Telephone Encounter (Signed)
Mardene Celeste had a visit with Iona Beard today, wrapped his leg.  His oxygen was 83 when she got there and when he sat down, it went up to 92%.  He is very SOB.   Also the main concern is that there is cockroaches all over the floors in his house, crawling in his recliner where he sits, all over his supplies he has for leg and the floor in his bedroom is fixing to collapse.  Nurse Mardene Celeste really thinks he needs to be admitted to hosp and then sent to a nursing home due to unlivable conditions at home.

## 2017-12-16 NOTE — ED Notes (Signed)
Pt ambulating in hallway with walker SpO2 dropped to 66%. Pt was able to walk back to his room and was placed on 2L nasal cannula and his oxygen increased to 100%

## 2017-12-16 NOTE — H&P (Signed)
[facility] NAME: Miguel Torres    MR#:  [id number]  DATE OF BIRTH:  1931-02-01  DATE OF ADMISSION:  12/16/2017  PRIMARY CARE PHYSICIAN: [name], MD   REQUESTING/REFERRING PHYSICIAN: Quale  CHIEF COMPLAINT:   Chief Complaint  Patient presents with  . Weakness    HISTORY OF PRESENT ILLNESS: Miguel Torres  is a 84 y.o. male with a known history of anemia, congestive heart failure, diabetes, hyperlipidemia, hypertension, atrial flutter-lives alone at home and walks with a walker with son and daughter living nearby. He was admitted to hospital last week with acute renal failure, IV fluids were given and he is metolazone was stopped at the time of discharge but continued on furosemide. Since then he is having worsening in his shortness of breath and generalized swelling up to his thigh and abdominal wall.  Went to see primary care physician today and he was supposed to see cardiologist in the next 1 to 2 weeks. Primary care physician noted him to be hypoxic up to 83% on room air and so suggested to go to emergency room for possible CHF. Patient denies any complaint of chest pain or palpitation.  PAST MEDICAL HISTORY:   Past Medical History:  Diagnosis Date  . Anemia   . CHF (congestive heart failure) ([place])   . Diabetes mellitus without complication ([place])   . Hyperlipidemia   . Hypertension   . Skin cancer     PAST SURGICAL HISTORY:  Past Surgical History:  Procedure Laterality Date  . BASAL CELL CARCINOMA EXCISION    . History of eye surgery Bilateral   . MOHS SURGERY  04/2011   the top of his head  . Right BKA  09/12/2010   Dr. Delana Meyer; Secondary gangrenous right LE  . TONSILLECTOMY      SOCIAL HISTORY:  Social History   Tobacco Use  . Smoking status: Never Smoker  . Smokeless tobacco: Never Used  Substance Use Topics  . Alcohol use: No    FAMILY HISTORY:  Family History  Problem Relation Age of  Onset  . Diabetes Mother   . Arthritis Sister   . Diabetes Daughter     DRUG ALLERGIES: No Known Allergies  REVIEW OF SYSTEMS:   CONSTITUTIONAL: No fever, fatigue or weakness.  EYES: No blurred or double vision.  EARS, NOSE, AND THROAT: No tinnitus or ear pain.  RESPIRATORY: No cough, have shortness of breath, no wheezing or hemoptysis.  CARDIOVASCULAR: No chest pain, orthopnea, have edema.  GASTROINTESTINAL: No nausea, vomiting, diarrhea or abdominal pain.  GENITOURINARY: No dysuria, hematuria.  ENDOCRINE: No polyuria, nocturia,  HEMATOLOGY: No anemia, easy bruising or bleeding SKIN: No rash or lesion. MUSCULOSKELETAL: No joint pain or arthritis.   NEUROLOGIC: No tingling, numbness, weakness.  PSYCHIATRY: No anxiety or depression.   MEDICATIONS AT HOME:  Prior to Admission medications   Medication Sig Start Date End Date Taking? Authorizing Provider  acetaminophen (TYLENOL) 325 MG tablet Take 2 tablets (650 mg total) by mouth every 6 (six) hours as needed for mild pain (or Fever >/= 101). Patient not taking: Reported on 12/16/2017 12/09/17   Nicholes Mango, MD  apixaban (ELIQUIS) 2.5 MG TABS tablet Take 1 tablet (2.5 mg total) by mouth 2 (two) times daily. 11/04/17   Henreitta Leber, MD  ascorbic acid (VITAMIN C) 250 MG tablet Take 250 mg by mouth daily.    [provider]  atenolol-chlorthalidone (TENORETIC) 50-25 MG tablet Take  1 tablet by mouth daily. 03/03/11   [provider]  Dakins 0.5 % SOLN Apply 1 application topically daily. LEFT FOOT    [provider]  feeding supplement, ENSURE ENLIVE, (ENSURE ENLIVE) LIQD Take 237 mLs by mouth 2 (two) times daily between meals. 12/09/17   Nicholes Mango, MD  felodipine (PLENDIL) 5 MG 24 hr tablet Take 1 tablet (5 mg total) by mouth daily. 11/04/17   Henreitta Leber, MD  ferrous sulfate 325 (65 FE) MG tablet Take 1 tablet by mouth daily. 06/05/11   [provider]  furosemide (LASIX) 20 MG tablet Take 1  tablet (20 mg total) by mouth 2 (two) times daily. 12/10/17   Gouru, Illene Silver, MD  glipiZIDE (GLUCOTROL) 10 MG tablet TAKE 1 TABLET (10 MG TOTAL) BY MOUTH DAILY. 04/06/17   [name], MD  HYDROcodone-acetaminophen (NORCO/VICODIN) 5-325 MG tablet Take 1 tablet by mouth every 6 (six) hours as needed for moderate pain. 12/09/17   Gouru, Illene Silver, MD  loratadine (CLARITIN) 10 MG tablet Take 10 mg by mouth daily.    [provider]  lovastatin (MEVACOR) 40 MG tablet TAKE 1 TABLET (40 MG TOTAL) BY MOUTH DAILY. 05/11/17   [name], MD  megestrol (MEGACE) 400 MG/10ML suspension Take 10 mLs (400 mg total) by mouth 2 (two) times daily. Patient not taking: Reported on 12/16/2017 12/09/17   Nicholes Mango, MD  metFORMIN (GLUCOPHAGE-XR) 500 MG 24 hr tablet TAKE 1 TABLET BY MOUTH EVERY DAY 12/13/17   [name], MD  metolazone (ZAROXOLYN) 5 MG tablet Take 5 mg by mouth daily.    [provider]  montelukast (SINGULAIR) 10 MG tablet TAKE 1 TABLET (10 MG TOTAL) BY MOUTH EVERY EVENING. 06/27/17   [name], MD  Multiple Vitamin (MULTIVITAMIN) tablet Take 1 tablet by mouth daily.    [provider]  multivitamin-lutein (OCUVITE-LUTEIN) CAPS capsule Take 1 capsule by mouth daily. Patient not taking: Reported on 12/16/2017 12/09/17   Nicholes Mango, MD  nutrition supplement, JUVEN, (JUVEN) PACK Take 1 packet by mouth 2 (two) times daily between meals. 12/09/17 01/08/18  Nicholes Mango, MD  nystatin (NYSTATIN) powder Apply 1 g topically 2 (two) times daily.    [provider]  pantoprazole (PROTONIX) 40 MG tablet Take 40 mg by mouth daily.    [provider]  pioglitazone (ACTOS) 30 MG tablet TAKE 1 TABLET (30 MG TOTAL) BY MOUTH DAILY. 12/02/17   [name], MD  polyethylene glycol [facility] / Floria Raveling) packet Take 17 g by mouth daily as needed for mild constipation.  02/04/13   [provider]  Vitamin D, Ergocalciferol, (DRISDOL) 50000 units CAPS capsule TAKE ONE  CAPSULE BY MOUTH EVERY 7 DAYS 10/21/17   [name], MD  Zinc Sulfate (ZINC-220 PO) Take 1 tablet by mouth daily.    [provider]      PHYSICAL EXAMINATION:   VITAL SIGNS: Blood pressure (!) 164/79, pulse 72, temperature 97.9 F (36.6 C), temperature source Oral, resp. rate 18, height 5\' 8"  (1.727 m), weight 106.1 kg, SpO2 98 %.  GENERAL:  84 y.o.-year-old patient lying in the bed with no acute distress.  EYES: Pupils equal, round, reactive to light and accommodation. No scleral icterus. Extraocular muscles intact.  HEENT: Head atraumatic, normocephalic. Oropharynx and nasopharynx clear.  NECK:  Supple, no jugular venous distention. No thyroid enlargement, no tenderness.  LUNGS: Normal breath sounds bilaterally, no wheezing, have crepitation. No use of accessory muscles of respiration.  CARDIOVASCULAR: S1, S2 normal. No murmurs, rubs, or gallops.  ABDOMEN: Soft, nontender, nondistended. Bowel sounds present. No organomegaly or mass.  EXTREMITIES: Bilateral pedal edema, no cyanosis, or clubbing.  Right side below knee amputation with prosthesis.  Left side Unna boot on his leg and have extensive swelling with edema on thigh. His both hands and lower abdominal wall is also swollen. NEUROLOGIC: Cranial nerves II through XII are intact. Muscle strength 4/5 in all extremities. Sensation intact. Gait not checked.  PSYCHIATRIC: The patient is alert and oriented x 3.  SKIN: No obvious rash, lesion, or ulcer.   LABORATORY PANEL:   CBC Recent Labs  Lab 12/16/17 1317  WBC 6.4  HGB 10.1*  HCT 30.0*  PLT 169  MCV 94.1  MCH 31.8  MCHC 33.8  RDW 15.9*   ------------------------------------------------------------------------------------------------------------------  Chemistries  Recent Labs  Lab 12/16/17 1317  NA 139  K 4.6  CL 105  CO2 25  GLUCOSE 168*  BUN 41*  CREATININE 1.45*  CALCIUM 8.5*    ------------------------------------------------------------------------------------------------------------------ estimated creatinine clearance is 44 mL/min (A) (by C-G formula based on SCr of 1.45 mg/dL (H)). ------------------------------------------------------------------------------------------------------------------ No results for input(s): TSH, T4TOTAL, T3FREE, THYROIDAB in the last 72 hours.  Invalid input(s): FREET3   Coagulation profile No results for input(s): INR, PROTIME in the last 168 hours. ------------------------------------------------------------------------------------------------------------------- No results for input(s): DDIMER in the last 72 hours. -------------------------------------------------------------------------------------------------------------------  Cardiac Enzymes Recent Labs  Lab 12/16/17 1317 12/16/17 1534  TROPONINI 0.06* 0.06*   ------------------------------------------------------------------------------------------------------------------ Invalid input(s): POCBNP  ---------------------------------------------------------------------------------------------------------------  Urinalysis    Component Value Date/Time   COLORURINE YELLOW (A) 12/07/2017 1159   APPEARANCEUR CLEAR (A) 12/07/2017 1159   LABSPEC 1.013 12/07/2017 1159   PHURINE 6.0 12/07/2017 1159   GLUCOSEU NEGATIVE 12/07/2017 1159   HGBUR NEGATIVE 12/07/2017 1159   BILIRUBINUR NEGATIVE 12/07/2017 1159   KETONESUR NEGATIVE 12/07/2017 1159   PROTEINUR NEGATIVE 12/07/2017 1159   NITRITE NEGATIVE 12/07/2017 1159   LEUKOCYTESUR NEGATIVE 12/07/2017 1159     RADIOLOGY: Dg Chest 2 View  Result Date: 12/16/2017 CLINICAL DATA:  Shortness of breath for 1 week EXAM: CHEST - 2 VIEW COMPARISON:  12/07/2017 FINDINGS: Bilateral diffuse interstitial thickening. No focal consolidation, pleural effusion or pneumothorax. Stable cardiomegaly. No acute osseous abnormality.  IMPRESSION: Cardiomegaly with mild pulmonary vascular congestion. Electronically Signed   By: Kathreen Devoid   On: 12/16/2017 13:50    EKG: Orders placed or performed during the hospital encounter of 12/16/17  . ED EKG within 10 minutes  . ED EKG within 10 minutes    IMPRESSION AND PLAN:  *Acute on chronic diastolic congestive heart failure This is secondary to stopping his metolazone on last admission due to his renal failure. I will start on IV Lasix, keep on fluid restriction, keep on intake and output check and daily weight measurements. We will have her to closely monitor his renal function and adjust the diuretics dose at the time of discharge. He will also benefit by CHF focused home health program at the time of discharge where he has CHF vest.  *Chronic renal failure stage III This appears to be stable currently, will have to be watchful with use of diuretics.  *Atrial flutter On beta-blockers and Eliquis. Heart rate is stable.  Continue monitoring.  Will get cardiology consult.  *Hyperlipidemia Continue statin.  *Diabetes He is on oral medications, blood sugar is controlled, keep on sliding scale coverage. Renal function get worse then he may need to be started on insulin.  He  will benefit from early ambulation to preserve his strength and proper home health care to watch for fluid overload.  Also due to recurrent admissions I would suggest to have a palliative care consult.  All the records are reviewed and case discussed with ED provider. Management plans discussed with the patient, family and they are in agreement.  CODE STATUS: DNR Code Status History    Date Active Date Inactive Code Status Order ID Comments User Context   12/07/2017 1424 12/09/2017 1613 Full Code [id number]  Gorden Harms, MD Inpatient   11/01/2017 1358 11/04/2017 1719 Full Code [id number]  Gladstone Lighter, MD Inpatient    Advance Directive Documentation     Most Recent Value  Type of Advance  Directive  Healthcare Power of Attorney, Living will, Out of facility DNR (pink MOST or yellow form)  Pre-existing out of facility DNR order (yellow form or pink MOST form)  -  "MOST" Form in Place?  -     Spoke to his daughter in the room.  TOTAL TIME TAKING CARE OF THIS PATIENT: 50 minutes.    Vaughan Basta M.D on 12/16/2017   Between 7am to 6pm - Pager - [phone]  After 6pm go to [email or web address] - password [email or web address] [facility]  Office  [phone]  CC: Primary care physician; [name], MD   Note: This dictation was prepared with Dragon dictation along with smaller phrase technology. Any transcriptional errors that result from this process are unintentional.

## 2017-12-16 NOTE — ED Triage Notes (Signed)
Patient to ED with daughter. Daughter reports patient has been increasingly weak and short of breath for several weeks. States PCP directed her to bring patient here. Patient was seen here approximately a week ago for similar complaints. Patient reports increasingly edema to leg, groin and hands, worsening for the last few days. History of CHF.

## 2017-12-16 NOTE — ED Provider Notes (Signed)
[facility] Emergency Department Provider Note   ____________________________________________   First MD Initiated Contact with Patient 12/16/17 1530     (approximate)  I have reviewed the triage vital signs and the nursing notes.   HISTORY  Chief Complaint Weakness    HPI Miguel Torres is a 84 y.o. male history of congestive heart failure, diabetes recent acute kidney injury.  Patient reports since going from the hospital about 2 to 3 days now is been experiencing very significant shortness of breath especially when he tries to exert himself or lay flat.  He is also noticed that his left leg and his right upper leg where he has previous amputation as well as both hands and felt swollen.  His family is not 163% certain of his medication regimen but reports they are giving him medicines he was prescribed when he was discharged last.  No chest pain.  No abdominal pain.  No headaches.  Reports he feels somewhat fatigued but mostly just very short of breath to the point he cannot even really get up from his bed to the bathroom without becoming so winded he cannot sit on the toilet  At rest he reports a mild feeling of shortness of breath but reports is much much worse when he tries to walk  Past Medical History:  Diagnosis Date  . Anemia   . CHF (congestive heart failure) ([place])   . Diabetes mellitus without complication ([place])   . Hyperlipidemia   . Hypertension   . Skin cancer     Patient Active Problem List   Diagnosis Date Noted  . Congestive heart failure (CHF) ([place]) 12/16/2017  . Acute diastolic CHF (congestive heart failure) ([place]) 12/16/2017  . Acute kidney injury ([place]) 12/07/2017  . Foot ulceration, left, with fat layer exposed ([place]) 11/23/2017  . Edema of left lower extremity 11/23/2017  . Pressure injury of skin 11/02/2017  . Elevated troponin 11/01/2017  . Microalbuminuria 10/15/2016  . Vitamin D deficiency 07/12/2015  . Retinopathy,  diabetic, proliferative ([place]) 07/02/2015  . Bleeding duodenal ulcer 02/02/2015  . Atrial flutter ([place]) 01/19/2015  . Allergic rhinitis 01/17/2015  . Anemia of chronic disease 01/17/2015  . CKD (chronic kidney disease) stage 4, GFR 15-29 ml/min (HCC) 01/17/2015  . Diabetes mellitus with nephropathy ([place]) 01/17/2015  . Edema 01/17/2015  . Gout 01/17/2015  . Amputation of right lower extremity below knee upon examination ([place]) 01/17/2015  . HLD (hyperlipidemia) 01/17/2015  . Psoriasis 01/17/2015  . CA of skin 01/17/2015  . Hypertension 10/27/2014  . Mild cognitive disorder 12/05/2013  . Cancer of skin, squamous cell 01/21/2013  . Squamous cell carcinoma of scalp and skin of neck 03/03/2011    Past Surgical History:  Procedure Laterality Date  . BASAL CELL CARCINOMA EXCISION    . History of eye surgery Bilateral   . MOHS SURGERY  04/2011   the top of his head  . Right BKA  09/12/2010   Dr. Delana Meyer; Secondary gangrenous right LE  . TONSILLECTOMY      Prior to Admission medications   Medication Sig Start Date End Date Taking? Authorizing Provider  acetaminophen (TYLENOL) 325 MG tablet Take 2 tablets (650 mg total) by mouth every 6 (six) hours as needed for mild pain (or Fever >/= 101). Patient not taking: Reported on 12/16/2017 12/09/17   Nicholes Mango, MD  apixaban (ELIQUIS) 2.5 MG TABS tablet Take 1 tablet (2.5 mg total) by mouth 2 (two) times daily. 11/04/17   Sainani,  Belia Heman, MD  ascorbic acid (VITAMIN C) 250 MG tablet Take 250 mg by mouth daily.    [provider]  atenolol-chlorthalidone (TENORETIC) 50-25 MG tablet Take 1 tablet by mouth daily. 03/03/11   [provider]  [place] 0.5 % SOLN Apply 1 application topically daily. LEFT FOOT    [provider]  feeding supplement, ENSURE ENLIVE, (ENSURE ENLIVE) LIQD Take 237 mLs by mouth 2 (two) times daily between meals. 12/09/17   Nicholes Mango, MD  felodipine (PLENDIL) 5 MG 24 hr tablet Take 1 tablet (5 mg  total) by mouth daily. 11/04/17   Henreitta Leber, MD  ferrous sulfate 325 (65 FE) MG tablet Take 1 tablet by mouth daily. 06/05/11   [provider]  furosemide (LASIX) 20 MG tablet Take 1 tablet (20 mg total) by mouth 2 (two) times daily. 12/10/17   Gouru, Illene Silver, MD  glipiZIDE (GLUCOTROL) 10 MG tablet TAKE 1 TABLET (10 MG TOTAL) BY MOUTH DAILY. 04/06/17   [name], MD  HYDROcodone-acetaminophen (NORCO/VICODIN) 5-325 MG tablet Take 1 tablet by mouth every 6 (six) hours as needed for moderate pain. 12/09/17   Gouru, Illene Silver, MD  loratadine (CLARITIN) 10 MG tablet Take 10 mg by mouth daily.    [provider]  lovastatin (MEVACOR) 40 MG tablet TAKE 1 TABLET (40 MG TOTAL) BY MOUTH DAILY. 05/11/17   [name], MD  megestrol (MEGACE) 400 MG/10ML suspension Take 10 mLs (400 mg total) by mouth 2 (two) times daily. Patient not taking: Reported on 12/16/2017 12/09/17   Nicholes Mango, MD  metFORMIN (GLUCOPHAGE-XR) 500 MG 24 hr tablet TAKE 1 TABLET BY MOUTH EVERY DAY 12/13/17   [name], MD  metolazone (ZAROXOLYN) 5 MG tablet Take 5 mg by mouth daily.    [provider]  montelukast (SINGULAIR) 10 MG tablet TAKE 1 TABLET (10 MG TOTAL) BY MOUTH EVERY EVENING. 06/27/17   [name], MD  Multiple Vitamin (MULTIVITAMIN) tablet Take 1 tablet by mouth daily.    [provider]  multivitamin-lutein (OCUVITE-LUTEIN) CAPS capsule Take 1 capsule by mouth daily. Patient not taking: Reported on 12/16/2017 12/09/17   Nicholes Mango, MD  nutrition supplement, JUVEN, (JUVEN) PACK Take 1 packet by mouth 2 (two) times daily between meals. 12/09/17 01/08/18  Nicholes Mango, MD  nystatin (NYSTATIN) powder Apply 1 g topically 2 (two) times daily.    [provider]  pantoprazole (PROTONIX) 40 MG tablet Take 40 mg by mouth daily.    [provider]  pioglitazone (ACTOS) 30 MG tablet TAKE 1 TABLET (30 MG TOTAL) BY MOUTH DAILY. 12/02/17   [name], MD  polyethylene  glycol [facility] / Floria Raveling) packet Take 17 g by mouth daily as needed for mild constipation.  02/04/13   [provider]  Vitamin D, Ergocalciferol, (DRISDOL) 50000 units CAPS capsule TAKE ONE CAPSULE BY MOUTH EVERY 7 DAYS 10/21/17   [name], MD  Zinc Sulfate (ZINC-220 PO) Take 1 tablet by mouth daily.    [provider]    Allergies Patient has no known allergies.  Family History  Problem Relation Age of Onset  . Diabetes Mother   . Arthritis Sister   . Diabetes Daughter     Social History Social History   Tobacco Use  . Smoking status: Never Smoker  . Smokeless tobacco: Never Used  Substance Use Topics  . Alcohol use: No  . Drug use: No    Review of Systems Constitutional: No  fever/chills Eyes: No visual changes. ENT: No sore throat. Cardiovascular: Denies chest pain. Respiratory: See HPI Gastrointestinal: No abdominal pain.  No nausea, no vomiting.  No diarrhea.  No constipation. Genitourinary: Negative for dysuria. Musculoskeletal: Negative for back pain. Skin: Negative for rash. Neurological: Negative for headaches, focal weakness or numbness.    ____________________________________________   PHYSICAL EXAM:  VITAL SIGNS: ED Triage Vitals  Enc Vitals Group     BP 12/16/17 1234 (!) 141/54     Pulse Rate 12/16/17 1234 69     Resp 12/16/17 1500 20     Temp 12/16/17 1234 97.9 F (36.6 C)     Temp Source 12/16/17 1234 Oral     SpO2 12/16/17 1234 95 %     Weight 12/16/17 1242 234 lb (106.1 kg)     Height 12/16/17 1242 5\' 8"  (1.727 m)     Head Circumference --      Peak Flow --      Pain Score 12/16/17 1241 4     Pain Loc --      Pain Edu? --      Excl. in [place]? --     Constitutional: Alert and oriented. Well appearing and in no acute distress except he appears just slightly dyspneic sitting still and sitting slightly upright. Eyes: Conjunctivae are normal. Head: Atraumatic. Nose: No congestion/rhinnorhea. Mouth/Throat: Mucous  membranes are moist. Neck: No stridor.   Cardiovascular: Normal rate, regular rhythm. Grossly normal heart sounds.  Good peripheral circulation. Respiratory: Normal respiratory effort.  No retractions. Lungs CTAB for some probably very faint crackles in the lower lobes bilateral. Gastrointestinal: Soft and nontender. No distention. Musculoskeletal: No lower extremity tenderness, left lower extremity in an [place] boot.  Right lower extremity surgically absent.  Right upper thigh left upper thigh both somewhat edematous left lower extremity does appear edematous with pitting edema, and both of his hands also appear just slightly edematous as well. Neurologic:  Normal speech and language. No gross focal neurologic deficits are appreciated.  Skin:  Skin is warm, dry and intact. No rash noted. Psychiatric: Mood and affect are normal. Speech and behavior are normal.  ____________________________________________   LABS (all labs ordered are listed, but only abnormal results are displayed)  Labs Reviewed  BASIC METABOLIC PANEL - Abnormal; Notable for the following components:      Result Value   Glucose, Bld 168 (*)    BUN 41 (*)    Creatinine, Ser 1.45 (*)    Calcium 8.5 (*)    GFR calc non Af Amer 42 (*)    GFR calc Af Amer 49 (*)    All other components within normal limits  CBC - Abnormal; Notable for the following components:   RBC 3.18 (*)    Hemoglobin 10.1 (*)    HCT 30.0 (*)    RDW 15.9 (*)    All other components within normal limits  TROPONIN I - Abnormal; Notable for the following components:   Troponin I 0.06 (*)    All other components within normal limits  TROPONIN I - Abnormal; Notable for the following components:   Troponin I 0.06 (*)    All other components within normal limits  BRAIN NATRIURETIC PEPTIDE - Abnormal; Notable for the following components:   B Natriuretic Peptide 1,305.0 (*)    All other components within normal limits  URINALYSIS, COMPLETE (UACMP) WITH  MICROSCOPIC   ____________________________________________  EKG  Reviewed and interpreted by me, Heart rate 55 QRS 100 QTc 410  A. fib with slightly slow ventricular response no ischemic change ____________________________________________  RADIOLOGY  Dg Chest 2 View  Result Date: 12/16/2017 CLINICAL DATA:  Shortness of breath for 1 week EXAM: CHEST - 2 VIEW COMPARISON:  12/07/2017 FINDINGS: Bilateral diffuse interstitial thickening. No focal consolidation, pleural effusion or pneumothorax. Stable cardiomegaly. No acute osseous abnormality. IMPRESSION: Cardiomegaly with mild pulmonary vascular congestion. Electronically Signed   By: Kathreen Devoid   On: 12/16/2017 13:50    ____________________________________________   PROCEDURES  Procedure(s) performed: None  Procedures  Critical Care performed: No  ____________________________________________   INITIAL IMPRESSION / ASSESSMENT AND PLAN / ED COURSE  Pertinent labs & imaging results that were available during my care of the patient were reviewed by me and considered in my medical decision making (see chart for details).  Patient presents for evaluation of shortness of breath.  On the patient is ambulated he drops his oxygen saturation to mid 60s on room air up with short ambulation becomes short of breath.  When he comes back and sits down his oxygen saturation returns to the mid 90s after just a brief period of time.  Placed on 2 L nasal cannula.  Given clinical exam and history is suspect is likely volume overload.  Discussed with patient we will initiate Lasix, admit and hospitalist service will provide further consultation care and treatment.  Discussed with Dr. Boyce Medici.      ____________________________________________   FINAL CLINICAL IMPRESSION(S) / ED DIAGNOSES  Final diagnoses:  CHF (congestive heart failure) ([place])      NEW MEDICATIONS STARTED DURING THIS VISIT:  New Prescriptions   No medications on file      Note:  This document was prepared using Dragon voice recognition software and may include unintentional dictation errors.     Delman Kitten, MD 12/16/17 [phone]

## 2017-12-16 NOTE — Care Management Obs Status (Signed)
[facility] NOTIFICATION   Patient Details  Name: Miguel Torres MRN: [id number] Date of Birth: [birth year]   Medicare Observation Status Notification Given:  Yes    Katrina Stack, RN 12/16/2017, 6:23 PM

## 2017-12-16 NOTE — Progress Notes (Signed)
Family Meeting Note  Advance Directive:yes  Today a meeting took place with the Patient and daughter.  The following clinical team members were present during this meeting:MD  The following were discussed:Patient's diagnosis: Diastolic CHF, chronic kidney disease, atrial flutter, hypertension, Patient's progosis: Unable to determine and Goals for treatment: DNR  Additional follow-up to be provided: Cardiology  Time spent during discussion:20 minutes  Vaughan Basta, MD

## 2017-12-16 NOTE — ED Notes (Addendum)
resumed care from [facility] rn.  Pt alert.  No chest pain.  nsr on monitor.  Skin warm and dry.  Family with pt.

## 2017-12-16 NOTE — Care Management (Addendum)
Patient placed in observation for CHF.  Was sent to ED by PCP.  He was being followed by [facility] under the heart failure protocol.  Unable to determine interventions that may have been made by home health agency to prevent return. Sent notice to liaison of current observation and requested specifics of interventions that have been made in the home.  It is documented that patient having sx for several weeks.  Was placed in observation last week for same sx.  At that time patient/family was offered changing home health agencies to [facility] due to availability of the CHF vest and they declined. Wonder if there would be benefit of outpatient palliative for sx management.  Patient is currently wearing supplemental 02 which is acute. If patient does discharge back to the home, would need to add sw to the referral.

## 2017-12-16 NOTE — ED Notes (Signed)
Report called to pasha rn floor nurse

## 2017-12-16 NOTE — Consult Note (Signed)
[facility] Consultation Note  Patient ID: Miguel Torres, MRN: [id number], DOB/AGE: 1930-09-07 84 y.o. Admit date: 12/16/2017   Date of Consult: 12/16/2017 Primary Physician: [name], MD Primary Cardiologist: Fath  Chief Complaint:  Chief Complaint  Patient presents with  . Weakness   Reason for Consult: Heart failure  HPI: 84 y.o. male with known diastolic dysfunction heart failure essential hypertension mixed hyperlipidemia diabetes with chronic nonvalvular atrial fibrillation status post significant complications of diabetes were including right BKA and coronary atherosclerosis.  The patient has been recently placed on appropriate medication management for diastolic dysfunction heart failure but has had episode of dehydration with his last visit.  Since then he is gone home and had significant weight gain with left lower extremity edema and some upper extremity edema as well as severe shortness of breath PND and orthopnea.  Chest x-ray suggest possible further pulmonary edema although exam suggests no evidence of rails.  The patient has a BNP of 05/08/2003 a glomerular filtration rate of 49 and a troponin of 0.06 most consistent with demand ischemia rather than acute coronary syndrome.  He has an EKG at this time with atrial fibrillation with left axis deviation and nonspecific ST and T wave changes.  He currently has had diuresis and has had improved breathing with oxygenation  Past Medical History:  Diagnosis Date  . Anemia   . CHF (congestive heart failure) ([place])   . Diabetes mellitus without complication ([place])   . Hyperlipidemia   . Hypertension   . Skin cancer       Surgical History:  Past Surgical History:  Procedure Laterality Date  . BASAL CELL CARCINOMA EXCISION    . History of eye surgery Bilateral   . MOHS SURGERY  04/2011   the top of his head  . Right BKA  09/12/2010   Dr. Delana Meyer; Secondary gangrenous right LE  . TONSILLECTOMY       Home  Meds: Prior to Admission medications   Medication Sig Start Date End Date Taking? Authorizing Provider  acetaminophen (TYLENOL) 325 MG tablet Take 2 tablets (650 mg total) by mouth every 6 (six) hours as needed for mild pain (or Fever >/= 101). 12/09/17  Yes Gouru, Aruna, MD  apixaban (ELIQUIS) 2.5 MG TABS tablet Take 1 tablet (2.5 mg total) by mouth 2 (two) times daily. 11/04/17  Yes Sainani, Belia Heman, MD  ascorbic acid (VITAMIN C) 250 MG tablet Take 250 mg by mouth daily.   Yes [provider]  feeding supplement, ENSURE ENLIVE, (ENSURE ENLIVE) LIQD Take 237 mLs by mouth 2 (two) times daily between meals. 12/09/17  Yes Gouru, Illene Silver, MD  felodipine (PLENDIL) 5 MG 24 hr tablet Take 1 tablet (5 mg total) by mouth daily. 11/04/17  Yes Sainani, Belia Heman, MD  ferrous sulfate 325 (65 FE) MG tablet Take 325 mg by mouth daily.    Yes [provider]  furosemide (LASIX) 20 MG tablet Take 1 tablet (20 mg total) by mouth 2 (two) times daily. 12/10/17  Yes Gouru, Aruna, MD  glipiZIDE (GLUCOTROL) 10 MG tablet TAKE 1 TABLET (10 MG TOTAL) BY MOUTH DAILY. Patient taking differently: Take 10 mg by mouth daily.  04/06/17  Yes [name], MD  HYDROcodone-acetaminophen (NORCO/VICODIN) 5-325 MG tablet Take 1 tablet by mouth every 6 (six) hours as needed for moderate pain. 12/09/17  Yes Gouru, Illene Silver, MD  loratadine (CLARITIN) 10 MG tablet Take 10 mg by mouth daily.   Yes [provider]  lovastatin (MEVACOR) 40 MG tablet TAKE 1 TABLET (40 MG TOTAL) BY MOUTH DAILY. Patient taking differently: Take 40 mg by mouth daily.  05/11/17  Yes [name], MD  metFORMIN (GLUCOPHAGE-XR) 500 MG 24 hr tablet TAKE 1 TABLET BY MOUTH EVERY DAY Patient taking differently: Take 500 mg by mouth daily.  12/13/17  Yes [name], MD  montelukast (SINGULAIR) 10 MG tablet TAKE 1 TABLET (10 MG TOTAL) BY MOUTH EVERY EVENING. Patient taking differently: Take 10 mg by mouth at bedtime.  06/27/17  Yes [name], MD   Multiple Vitamin (MULTIVITAMIN) tablet Take 1 tablet by mouth daily.   Yes [provider]  multivitamin-lutein (OCUVITE-LUTEIN) CAPS capsule Take 1 capsule by mouth daily. 12/09/17  Yes Gouru, Illene Silver, MD  nutrition supplement, JUVEN, (JUVEN) PACK Take 1 packet by mouth 2 (two) times daily between meals. 12/09/17 01/08/18 Yes Gouru, Aruna, MD  pantoprazole (PROTONIX) 40 MG tablet Take 40 mg by mouth daily.   Yes [provider]  pioglitazone (ACTOS) 30 MG tablet TAKE 1 TABLET (30 MG TOTAL) BY MOUTH DAILY. Patient taking differently: Take 30 mg by mouth daily.  12/02/17  Yes [name], MD  polyethylene glycol [facility] / Floria Raveling) packet Take 17 g by mouth daily as needed for mild constipation.  02/04/13  Yes [provider]  Vitamin D, Ergocalciferol, (DRISDOL) 50000 units CAPS capsule TAKE ONE CAPSULE BY MOUTH EVERY 7 DAYS Patient taking differently: Take 50,000 Units by mouth every Saturday.  10/21/17  Yes [name], MD  megestrol (MEGACE) 400 MG/10ML suspension Take 10 mLs (400 mg total) by mouth 2 (two) times daily. Patient not taking: Reported on 12/16/2017 12/09/17   Nicholes Mango, MD    Inpatient Medications:  . apixaban  2.5 mg Oral BID  . [START ON 12/17/2017] feeding supplement (ENSURE ENLIVE)  237 mL Oral BID BM  . [START ON 12/17/2017] felodipine  5 mg Oral Daily  . [START ON 12/17/2017] ferrous sulfate  325 mg Oral Daily  . furosemide  20 mg Intravenous Q8H  . [START ON 12/17/2017] glipiZIDE  5 mg Oral QAC breakfast  . insulin aspart  0-9 Units Subcutaneous TID WC  . [START ON 12/17/2017] metFORMIN  500 mg Oral Daily  . montelukast  5 mg Oral QHS  . [START ON 12/17/2017] multivitamin with minerals  1 tablet Oral Daily  . [START ON 12/17/2017] nutrition supplement (JUVEN)  1 packet Oral BID BM  . [START ON 12/17/2017] pantoprazole  40 mg Oral Daily  . [START ON 12/17/2017] pravastatin  40 mg Oral Daily  . [START ON 12/17/2017] ascorbic acid  250 mg Oral Daily   . [START ON 12/17/2017] zinc sulfate  220 mg Oral Daily     Allergies: No Known Allergies  Social History   Socioeconomic History  . Marital status: Married    Spouse name: Not on file  . Number of children: 5  . Years of education: 5th grade  . Highest education level: Not on file  Occupational History  . Occupation: Retired  Scientific laboratory technician  . Financial resource strain: Not on file  . Food insecurity:    Worry: Not on file    Inability: Not on file  . Transportation needs:    Medical: Not on file    Non-medical: Not on file  Tobacco Use  . Smoking status: Never Smoker  . Smokeless tobacco: Never Used  Substance and Sexual Activity  . Alcohol use: No  .  Drug use: No  . Sexual activity: Not on file  Lifestyle  . Physical activity:    Days per week: Not on file    Minutes per session: Not on file  . Stress: Not on file  Relationships  . Social connections:    Talks on phone: Not on file    Gets together: Not on file    Attends religious service: Not on file    Active member of club or organization: Not on file    Attends meetings of clubs or organizations: Not on file    Relationship status: Not on file  . Intimate partner violence:    Fear of current or ex partner: Not on file    Emotionally abused: Not on file    Physically abused: Not on file    Forced sexual activity: Not on file  Other Topics Concern  . Not on file  Social History Narrative   Lives at home by himself.  Ambulates with a walker   Has a  right prosthetic leg     Family History  Problem Relation Age of Onset  . Diabetes Mother   . Arthritis Sister   . Diabetes Daughter      Review of Systems Positive for shortness of breath lower extremity edema D orthopnea Negative for: General:  chills, fever, night sweats or weight changes.  Cardiovascular:   syncope dizziness  Dermatological skin lesions rashes Respiratory: Cough congestion Urologic: Frequent urination urination at night and  hematuria Abdominal: negative for nausea, vomiting, diarrhea, bright red blood per rectum, melena, or hematemesis Neurologic: negative for visual changes, and/or hearing changes  All other systems reviewed and are otherwise negative except as noted above.  Labs: Recent Labs    12/16/17 1317 12/16/17 1534  TROPONINI 0.06* 0.06*   Lab Results  Component Value Date   WBC 6.4 12/16/2017   HGB 10.1 (L) 12/16/2017   HCT 30.0 (L) 12/16/2017   MCV 94.1 12/16/2017   PLT 169 12/16/2017    Recent Labs  Lab 12/16/17 1317  NA 139  K 4.6  CL 105  CO2 25  BUN 41*  CREATININE 1.45*  CALCIUM 8.5*  GLUCOSE 168*   Lab Results  Component Value Date   CHOL 90 11/01/2017   HDL 48 11/01/2017   LDLCALC 31 11/01/2017   TRIG 56 11/01/2017   No results found for: DDIMER  Radiology/Studies:  Dg Chest 2 View  Result Date: 12/16/2017 CLINICAL DATA:  Shortness of breath for 1 week EXAM: CHEST - 2 VIEW COMPARISON:  12/07/2017 FINDINGS: Bilateral diffuse interstitial thickening. No focal consolidation, pleural effusion or pneumothorax. Stable cardiomegaly. No acute osseous abnormality. IMPRESSION: Cardiomegaly with mild pulmonary vascular congestion. Electronically Signed   By: Kathreen Devoid   On: 12/16/2017 13:50   Dg Chest 2 View  Result Date: 12/07/2017 CLINICAL DATA:  Weakness.  Hypertension. EXAM: CHEST - 2 VIEW COMPARISON:  November 01, 2017 FINDINGS: There is an equivocal left pleural effusion. There is no edema or consolidation. Heart is upper normal in size with pulmonary vascularity normal. No adenopathy. Bones are osteoporotic. There is degenerative change in the thoracic spine. There is aortic atherosclerosis. IMPRESSION: Equivocal left pleural effusion. No edema or consolidation. Stable cardiac silhouette. There is aortic atherosclerosis. Bones osteoporotic. Aortic Atherosclerosis (ICD10-I70.0). Electronically Signed   By: Lowella Grip III M.D.   On: 12/07/2017 11:18    EKG: Atrial  fibrillation with left axis deviation and nonspecific ST and T wave changes  Weights: [employer]  Weights   12/16/17 1242  Weight: 106.1 kg     Physical Exam: Blood pressure (!) 161/90, pulse 72, temperature 97.8 F (36.6 C), temperature source Oral, resp. rate 18, height 5\' 8"  (1.727 m), weight 106.1 kg, SpO2 100 %. Body mass index is 35.58 kg/m. General: Well developed, well nourished, in no acute distress. Head eyes ears nose throat: Normocephalic, atraumatic, sclera non-icteric, no xanthomas, nares are without discharge. No apparent thyromegaly and/or mass  Lungs: Normal respiratory effort.  Diffuse wheezes, no rales, no rhonchi.  Heart: Irregular with normal S1 S2. no murmur gallop, no rub, PMI is normal size and placement, carotid upstroke normal without bruit, jugular venous pressure is normal Abdomen: Soft, non-tender, non-distended with normoactive bowel sounds. No hepatomegaly. No rebound/guarding. No obvious abdominal masses. Abdominal aorta is normal size with   bruit Extremities: 2+ left lower edema. no cyanosis, no clubbing, positive ulcers right BKA Peripheral : 2+ bilateral upper extremity pulses, 2+ bilateral femoral pulses, 2+ bilateral dorsal pedal pulse Neuro: Alert and oriented. No facial asymmetry. No focal deficit. Moves all extremities spontaneously. Musculoskeletal: Normal muscle tone without kyphosis Psych:  Responds to questions appropriately with a normal affect.    Assessment: 84 year old male with acute on chronic diastolic dysfunction congestive heart failure chronic nonvalvular atrial fibrillation with elevated troponin consistent with demand ischemia without evidence of acute coronary syndrome  Plan: 1.  Continue intravenous furosemide for further treatment of acute on chronic diastolic dysfunction congestive heart failure and pulmonary edema 2.  Begin and continue compression of left lower extremity for edema including [place] boot 3.  Continue oxygenation for  hypoxia and congestive heart failure 4.  No further cardiac diagnostics necessary at this time 5.  Possible discontinuation of amlodipine which could exacerbate lower extremity edema 6.  High intensity cholesterol therapy 7.  Continue anticoagulation for further risk reduction in stroke with atrial fibrillation  Signed, Corey Skains M.D. [facility] 12/16/2017, 6:24 PM

## 2017-12-17 ENCOUNTER — Observation Stay: Payer: Medicare Other

## 2017-12-17 DIAGNOSIS — E11622 Type 2 diabetes mellitus with other skin ulcer: Secondary | ICD-10-CM | POA: Diagnosis not present

## 2017-12-17 DIAGNOSIS — N5089 Other specified disorders of the male genital organs: Secondary | ICD-10-CM | POA: Diagnosis present

## 2017-12-17 DIAGNOSIS — M75121 Complete rotator cuff tear or rupture of right shoulder, not specified as traumatic: Secondary | ICD-10-CM | POA: Diagnosis not present

## 2017-12-17 DIAGNOSIS — K219 Gastro-esophageal reflux disease without esophagitis: Secondary | ICD-10-CM | POA: Diagnosis not present

## 2017-12-17 DIAGNOSIS — Z85828 Personal history of other malignant neoplasm of skin: Secondary | ICD-10-CM | POA: Diagnosis not present

## 2017-12-17 DIAGNOSIS — N433 Hydrocele, unspecified: Secondary | ICD-10-CM | POA: Diagnosis present

## 2017-12-17 DIAGNOSIS — E11621 Type 2 diabetes mellitus with foot ulcer: Secondary | ICD-10-CM | POA: Diagnosis present

## 2017-12-17 DIAGNOSIS — R7989 Other specified abnormal findings of blood chemistry: Secondary | ICD-10-CM | POA: Diagnosis not present

## 2017-12-17 DIAGNOSIS — I130 Hypertensive heart and chronic kidney disease with heart failure and stage 1 through stage 4 chronic kidney disease, or unspecified chronic kidney disease: Secondary | ICD-10-CM | POA: Diagnosis present

## 2017-12-17 DIAGNOSIS — L97829 Non-pressure chronic ulcer of other part of left lower leg with unspecified severity: Secondary | ICD-10-CM | POA: Diagnosis not present

## 2017-12-17 DIAGNOSIS — N138 Other obstructive and reflux uropathy: Secondary | ICD-10-CM | POA: Diagnosis not present

## 2017-12-17 DIAGNOSIS — I5033 Acute on chronic diastolic (congestive) heart failure: Secondary | ICD-10-CM | POA: Diagnosis present

## 2017-12-17 DIAGNOSIS — E782 Mixed hyperlipidemia: Secondary | ICD-10-CM | POA: Diagnosis present

## 2017-12-17 DIAGNOSIS — I5032 Chronic diastolic (congestive) heart failure: Secondary | ICD-10-CM | POA: Diagnosis not present

## 2017-12-17 DIAGNOSIS — I110 Hypertensive heart disease with heart failure: Secondary | ICD-10-CM | POA: Diagnosis not present

## 2017-12-17 DIAGNOSIS — Z515 Encounter for palliative care: Secondary | ICD-10-CM

## 2017-12-17 DIAGNOSIS — Z7901 Long term (current) use of anticoagulants: Secondary | ICD-10-CM | POA: Diagnosis not present

## 2017-12-17 DIAGNOSIS — N183 Chronic kidney disease, stage 3 (moderate): Secondary | ICD-10-CM | POA: Diagnosis present

## 2017-12-17 DIAGNOSIS — E559 Vitamin D deficiency, unspecified: Secondary | ICD-10-CM | POA: Diagnosis not present

## 2017-12-17 DIAGNOSIS — Z833 Family history of diabetes mellitus: Secondary | ICD-10-CM | POA: Diagnosis not present

## 2017-12-17 DIAGNOSIS — E1122 Type 2 diabetes mellitus with diabetic chronic kidney disease: Secondary | ICD-10-CM | POA: Diagnosis present

## 2017-12-17 DIAGNOSIS — L97529 Non-pressure chronic ulcer of other part of left foot with unspecified severity: Secondary | ICD-10-CM | POA: Diagnosis present

## 2017-12-17 DIAGNOSIS — I509 Heart failure, unspecified: Secondary | ICD-10-CM | POA: Diagnosis not present

## 2017-12-17 DIAGNOSIS — M25511 Pain in right shoulder: Secondary | ICD-10-CM | POA: Diagnosis not present

## 2017-12-17 DIAGNOSIS — E119 Type 2 diabetes mellitus without complications: Secondary | ICD-10-CM | POA: Diagnosis not present

## 2017-12-17 DIAGNOSIS — E785 Hyperlipidemia, unspecified: Secondary | ICD-10-CM | POA: Diagnosis not present

## 2017-12-17 DIAGNOSIS — I5031 Acute diastolic (congestive) heart failure: Secondary | ICD-10-CM

## 2017-12-17 DIAGNOSIS — Z89511 Acquired absence of right leg below knee: Secondary | ICD-10-CM | POA: Diagnosis not present

## 2017-12-17 DIAGNOSIS — N451 Epididymitis: Secondary | ICD-10-CM | POA: Diagnosis not present

## 2017-12-17 DIAGNOSIS — I482 Chronic atrial fibrillation: Secondary | ICD-10-CM | POA: Diagnosis present

## 2017-12-17 DIAGNOSIS — R0902 Hypoxemia: Secondary | ICD-10-CM | POA: Diagnosis present

## 2017-12-17 DIAGNOSIS — N179 Acute kidney failure, unspecified: Secondary | ICD-10-CM | POA: Diagnosis not present

## 2017-12-17 DIAGNOSIS — Z79899 Other long term (current) drug therapy: Secondary | ICD-10-CM | POA: Diagnosis not present

## 2017-12-17 DIAGNOSIS — Z7401 Bed confinement status: Secondary | ICD-10-CM | POA: Diagnosis not present

## 2017-12-17 DIAGNOSIS — I4892 Unspecified atrial flutter: Secondary | ICD-10-CM | POA: Diagnosis present

## 2017-12-17 DIAGNOSIS — N410 Acute prostatitis: Secondary | ICD-10-CM | POA: Diagnosis not present

## 2017-12-17 DIAGNOSIS — E1151 Type 2 diabetes mellitus with diabetic peripheral angiopathy without gangrene: Secondary | ICD-10-CM | POA: Diagnosis not present

## 2017-12-17 DIAGNOSIS — L89894 Pressure ulcer of other site, stage 4: Secondary | ICD-10-CM | POA: Diagnosis not present

## 2017-12-17 DIAGNOSIS — Z7984 Long term (current) use of oral hypoglycemic drugs: Secondary | ICD-10-CM | POA: Diagnosis not present

## 2017-12-17 DIAGNOSIS — Z7189 Other specified counseling: Secondary | ICD-10-CM

## 2017-12-17 DIAGNOSIS — D649 Anemia, unspecified: Secondary | ICD-10-CM | POA: Diagnosis not present

## 2017-12-17 DIAGNOSIS — R339 Retention of urine, unspecified: Secondary | ICD-10-CM | POA: Diagnosis not present

## 2017-12-17 DIAGNOSIS — Z66 Do not resuscitate: Secondary | ICD-10-CM | POA: Diagnosis present

## 2017-12-17 DIAGNOSIS — R338 Other retention of urine: Secondary | ICD-10-CM | POA: Diagnosis present

## 2017-12-17 DIAGNOSIS — N430 Encysted hydrocele: Secondary | ICD-10-CM | POA: Diagnosis not present

## 2017-12-17 DIAGNOSIS — N442 Benign cyst of testis: Secondary | ICD-10-CM | POA: Diagnosis present

## 2017-12-17 DIAGNOSIS — L89892 Pressure ulcer of other site, stage 2: Secondary | ICD-10-CM | POA: Diagnosis not present

## 2017-12-17 DIAGNOSIS — N401 Enlarged prostate with lower urinary tract symptoms: Secondary | ICD-10-CM | POA: Diagnosis present

## 2017-12-17 DIAGNOSIS — M1990 Unspecified osteoarthritis, unspecified site: Secondary | ICD-10-CM | POA: Diagnosis not present

## 2017-12-17 DIAGNOSIS — R4182 Altered mental status, unspecified: Secondary | ICD-10-CM | POA: Diagnosis not present

## 2017-12-17 LAB — GLUCOSE, CAPILLARY
GLUCOSE-CAPILLARY: 163 mg/dL — AB (ref 70–99)
GLUCOSE-CAPILLARY: 127 mg/dL — AB (ref 70–99)
GLUCOSE-CAPILLARY: 108 mg/dL — AB (ref 70–99)
Glucose-Capillary: 193 mg/dL — ABNORMAL HIGH (ref 70–99)
Glucose-Capillary: 140 mg/dL — ABNORMAL HIGH (ref 70–99)

## 2017-12-17 LAB — CBC
HCT: 30.2 % — ABNORMAL LOW (ref 40.0–52.0)
Hemoglobin: 10.5 g/dL — ABNORMAL LOW (ref 13.0–18.0)
MCH: 31.9 pg (ref 26.0–34.0)
MCHC: 34.7 g/dL (ref 32.0–36.0)
MCV: 92.1 fL (ref 80.0–100.0)
PLATELETS: 160 10*3/uL (ref 150–440)
RBC: 3.28 MIL/uL — AB (ref 4.40–5.90)
RDW: 15.6 % — AB (ref 11.5–14.5)
WBC: 8.7 10*3/uL (ref 3.8–10.6)

## 2017-12-17 LAB — BASIC METABOLIC PANEL
Anion gap: 6 (ref 5–15)
BUN: 40 mg/dL — AB (ref 8–23)
CO2: 28 mmol/L (ref 22–32)
CREATININE: 1.39 mg/dL — AB (ref 0.61–1.24)
Calcium: 8.5 mg/dL — ABNORMAL LOW (ref 8.9–10.3)
Chloride: 104 mmol/L (ref 98–111)
GFR calc non Af Amer: 45 mL/min — ABNORMAL LOW (ref >60)
GFR, EST AFRICAN AMERICAN: 52 mL/min — AB (ref >60)
Glucose, Bld: 105 mg/dL — ABNORMAL HIGH (ref 70–99)
Potassium: 4.5 mmol/L (ref 3.5–5.1)
SODIUM: 138 mmol/L (ref 135–145)

## 2017-12-17 NOTE — Consult Note (Signed)
Consultation Note Date: 12/17/2017   Patient Name: Miguel Torres  DOB: November 11, 1930  MRN: [id number]  Age / Sex: 84 y.o., male  PCP: [name], MD Referring Physician: Dustin Flock, MD  Reason for Consultation: Establishing goals of care  HPI/Patient Profile: 84 y.o. male  with past medical history of diastolic heart failure, atrial flutter (on Eliquis), CKD stage III, HTN, HLD, DM, basal cell carcinoma admitted on 12/16/2017 with worsening SOB, weakness, BLE swelling r/t acute on chronic diastolic heart failure exacerbation with stable renal function and stable atrial flutter.   Clinical Assessment and Goals of Care: I met today with Mr. Atchley along with his daughter, [name] [phone]). Mr. Mosqueda is fatigued. We discussed natural trajectory of heart failure and the difficult balance of diuresis and renal function to keep him optimized. Discussed this balance becomes more difficult as the disease itself worsens over time naturally. Explained common symptoms of chronic illness of fatigue and weakness (and poor appetite although his appetite is good). They tell me that they are hopeful that he can go to rehab at [facility] (his wife is there in long term care). CSW to assist with options and placement.   When I asked about Mr. Duchene concerns he tells me "I have none." He shares with me that he is a [place] and knows where he is going when he dies and when his time comes he is ready to go. His daughter seems somewhat anxious at this discussion. Mr. Belluomini wishes to rest at this time.   I did speak briefly with his daughter as she was leaving and did explain that there may be a point in the future if Mr. Shackleton wishes to stay home that hospice can assist with helping manage him and optimize his QOL at home. [name] nods and says they will keep this in mind. Emotional support  provided.   Primary Decision Maker PATIENT    SUMMARY OF RECOMMENDATIONS   - DNR in place - Hopeful for rehab placement - Introduced option of home hospice to daughter  Code Status/Advance Care Planning:  DNR   Symptom Management:   No current symptoms. Per primary.   Palliative Prophylaxis:   Bowel Regimen, Delirium Protocol, Palliative Wound Care and Turn Reposition  Additional Recommendations (Limitations, Scope, Preferences):  Avoid Hospitalization  Psycho-social/Spiritual:   Desire for further Chaplaincy support:yes  Additional Recommendations: Caregiving  Support/Resources and Education on Hospice  Prognosis:   Overall poor with worsening co-morbidities. Would be eligible for home hospice < 6 months if desired.   Discharge Planning: [place] for rehab with Palliative care service follow-up      Primary Diagnoses: Present on Admission: **None**   I have reviewed the medical record, interviewed the patient and family, and examined the patient. The following aspects are pertinent.  Past Medical History:  Diagnosis Date  . Anemia   . CHF (congestive heart failure) ([place])   . Diabetes mellitus without complication ([place])   . Hyperlipidemia   . Hypertension   .  Skin cancer    Social History   Socioeconomic History  . Marital status: Married    Spouse name: Not on file  . Number of children: 5  . Years of education: 5th grade  . Highest education level: Not on file  Occupational History  . Occupation: Retired  Scientific laboratory technician  . Financial resource strain: Not on file  . Food insecurity:    Worry: Not on file    Inability: Not on file  . Transportation needs:    Medical: Not on file    Non-medical: Not on file  Tobacco Use  . Smoking status: Never Smoker  . Smokeless tobacco: Never Used  Substance and Sexual Activity  . Alcohol use: No  . Drug use: No  . Sexual activity: Not on file  Lifestyle  . Physical activity:    Days  per week: Not on file    Minutes per session: Not on file  . Stress: Not on file  Relationships  . Social connections:    Talks on phone: Not on file    Gets together: Not on file    Attends religious service: Not on file    Active member of club or organization: Not on file    Attends meetings of clubs or organizations: Not on file    Relationship status: Not on file  Other Topics Concern  . Not on file  Social History Narrative   Lives at home by himself.  Ambulates with a walker   Has a  right prosthetic leg   Family History  Problem Relation Age of Onset  . Diabetes Mother   . Arthritis Sister   . Diabetes Daughter    Scheduled Meds: . apixaban  2.5 mg Oral BID  . feeding supplement (ENSURE ENLIVE)  237 mL Oral BID BM  . felodipine  5 mg Oral Daily  . ferrous sulfate  325 mg Oral Daily  . furosemide  20 mg Intravenous Q8H  . glipiZIDE  5 mg Oral QAC breakfast  . insulin aspart  0-9 Units Subcutaneous TID WC  . metFORMIN  500 mg Oral Daily  . montelukast  5 mg Oral QHS  . multivitamin with minerals  1 tablet Oral Daily  . nutrition supplement (JUVEN)  1 packet Oral BID BM  . pantoprazole  40 mg Oral Daily  . pravastatin  40 mg Oral Daily  . ascorbic acid  250 mg Oral Daily  . zinc sulfate  220 mg Oral Daily   Continuous Infusions: PRN Meds:.acetaminophen, docusate sodium, HYDROcodone-acetaminophen, polyethylene glycol No Known Allergies Review of Systems  Constitutional: Positive for activity change and fatigue. Negative for appetite change.  Respiratory: Positive for shortness of breath.   Neurological: Positive for weakness.    Physical Exam  Constitutional: He is oriented to person, place, and time. He appears well-developed. He appears ill.  HENT:  Head: Normocephalic and atraumatic.  Cardiovascular: Normal rate. An irregularly irregular rhythm present.  Pulmonary/Chest: Effort normal. No accessory muscle usage. No tachypnea. No respiratory distress.  SOB  improved and oxygen helps  Abdominal: Normal appearance.  Neurological: He is alert and oriented to person, place, and time.  Sleepy, fatigued  Skin:  Rt BKA BLE edema up into thighs  Nursing note and vitals reviewed.   Vital Signs: BP 133/72 (BP Location: Right Arm)   Pulse 82   Temp 97.6 F (36.4 C) (Oral)   Resp 18   Ht '5\' 8"'  (1.727 m)   Wt 103.9 kg  SpO2 99%   BMI 34.82 kg/m  Pain Scale: 0-10   Pain Score: 0-No pain   SpO2: SpO2: 99 % O2 Device:SpO2: 99 % O2 Flow Rate: .O2 Flow Rate (L/min): 2 L/min  IO: Intake/output summary:   Intake/Output Summary (Last 24 hours) at 12/17/2017 1037 Last data filed at 12/16/2017 2302 Gross per 24 hour  Intake -  Output 500 ml  Net -500 ml    LBM: Last BM Date: 12/16/17 Baseline Weight: Weight: 106.1 kg Most recent weight: Weight: 103.9 kg     Palliative Assessment/Data: 30%     Time Total: 45 min  Greater than 50%  of this time was spent counseling and coordinating care related to the above assessment and plan.  Signed by: Vinie Sill, NP Palliative Medicine Team Pager # [phone] (M-F 8a-5p) Team Phone # [phone] (Nights/Weekends)

## 2017-12-17 NOTE — NC FL2 (Signed)
[place] LEVEL OF CARE SCREENING TOOL     IDENTIFICATION  Patient Name: Miguel Torres Birthdate: 01/14/1931 Sex: male Admission Date (Current Location): 12/16/2017  [facility] and [place] Number:  Engineering geologist and Address:  [facility], [address] [place], [place] [address]      Provider Number: [id number]  Attending Physician Name and Address:  Dustin Flock, MD  Relative Name and Phone Number:       Current Level of Care: Hospital Recommended Level of Care: [place] Prior Approval Number:    Date Approved/Denied:   PASRR Number:    Discharge Plan: SNF    Current Diagnoses: Patient Active Problem List   Diagnosis Date Noted  . Acute diastolic (congestive) heart failure ([place]) 12/17/2017  . Goals of care, counseling/discussion   . Palliative care encounter   . Congestive heart failure (CHF) ([place]) 12/16/2017  . Acute diastolic CHF (congestive heart failure) ([place]) 12/16/2017  . Acute kidney injury ([place]) 12/07/2017  . Foot ulceration, left, with fat layer exposed ([place]) 11/23/2017  . Edema of left lower extremity 11/23/2017  . Pressure injury of skin 11/02/2017  . Elevated troponin 11/01/2017  . Microalbuminuria 10/15/2016  . Vitamin D deficiency 07/12/2015  . Retinopathy, diabetic, proliferative ([place]) 07/02/2015  . Bleeding duodenal ulcer 02/02/2015  . Atrial flutter ([place]) 01/19/2015  . Allergic rhinitis 01/17/2015  . Anemia of chronic disease 01/17/2015  . CKD (chronic kidney disease) stage 4, GFR 15-29 ml/min (HCC) 01/17/2015  . Diabetes mellitus with nephropathy ([place]) 01/17/2015  . Edema 01/17/2015  . Gout 01/17/2015  . Amputation of right lower extremity below knee upon examination ([place]) 01/17/2015  . HLD (hyperlipidemia) 01/17/2015  . Psoriasis 01/17/2015  . CA of skin 01/17/2015  . Hypertension 10/27/2014  . Mild cognitive disorder 12/05/2013  . Cancer of skin, squamous cell 01/21/2013   . Squamous cell carcinoma of scalp and skin of neck 03/03/2011    Orientation RESPIRATION BLADDER Height & Weight     Self, Time, Situation, Place  Normal, O2(2 liters) Incontinent Weight: 229 lb (103.9 kg) Height:  5\' 8"  (172.7 cm)  BEHAVIORAL SYMPTOMS/MOOD NEUROLOGICAL BOWEL NUTRITION STATUS  (none) (none) Continent Diet(heart healthy/carb modified)  AMBULATORY STATUS COMMUNICATION OF NEEDS Skin   Limited Assist Verbally PU Stage and Appropriate Care                       Personal Care Assistance Level of Assistance  Bathing, Feeding, Dressing Bathing Assistance: Limited assistance Feeding assistance: Limited assistance Dressing Assistance: Limited assistance     Functional Limitations Info  (none)          SPECIAL CARE FACTORS FREQUENCY  PT (By licensed PT)                    Contractures Contractures Info: Not present    Additional Factors Info  Code Status               Current Medications (12/17/2017):  This is the current hospital active medication list Current Facility-Administered Medications  Medication Dose Route Frequency Provider Last Rate Last Dose  . acetaminophen (TYLENOL) tablet 650 mg  650 mg Oral Q6H PRN Vaughan Basta, MD      . apixaban Arne Cleveland) tablet 2.5 mg  2.5 mg Oral BID Vaughan Basta, MD   2.5 mg at 12/17/17 1004  . docusate sodium (COLACE) capsule 100 mg  100 mg Oral BID PRN Vaughan Basta, MD      .  feeding supplement (ENSURE ENLIVE) (ENSURE ENLIVE) liquid 237 mL  237 mL Oral BID BM Vaughan Basta, MD   237 mL at 12/17/17 1012  . felodipine (PLENDIL) 24 hr tablet 5 mg  5 mg Oral Daily Vaughan Basta, MD   5 mg at 12/17/17 1004  . ferrous sulfate tablet 325 mg  325 mg Oral Daily Vaughan Basta, MD   325 mg at 12/17/17 1003  . furosemide (LASIX) injection 20 mg  20 mg Intravenous Q8H Vaughan Basta, MD   20 mg at 12/17/17 1418  . glipiZIDE (GLUCOTROL) tablet 5 mg  5 mg  Oral QAC breakfast Vaughan Basta, MD   5 mg at 12/17/17 1005  . HYDROcodone-acetaminophen (NORCO/VICODIN) 5-325 MG per tablet 1 tablet  1 tablet Oral Q6H PRN Vaughan Basta, MD      . insulin aspart (novoLOG) injection 0-9 Units  0-9 Units Subcutaneous TID WC Vaughan Basta, MD   2 Units at 12/17/17 1245  . metFORMIN (GLUCOPHAGE-XR) 24 hr tablet 500 mg  500 mg Oral Daily Vaughan Basta, MD   500 mg at 12/17/17 1005  . montelukast (SINGULAIR) tablet 5 mg  5 mg Oral QHS Vaughan Basta, MD   5 mg at 12/16/17 2100  . multivitamin with minerals tablet 1 tablet  1 tablet Oral Daily Vaughan Basta, MD   1 tablet at 12/17/17 1004  . nutrition supplement (JUVEN) (JUVEN) powder packet 1 packet  1 packet Oral BID BM Vaughan Basta, MD   1 packet at 12/17/17 1418  . pantoprazole (PROTONIX) EC tablet 40 mg  40 mg Oral Daily Vaughan Basta, MD   40 mg at 12/17/17 1003  . polyethylene glycol (MIRALAX / GLYCOLAX) packet 17 g  17 g Oral Daily PRN Vaughan Basta, MD      . pravastatin (PRAVACHOL) tablet 40 mg  40 mg Oral Daily Vaughan Basta, MD   40 mg at 12/17/17 1003  . vitamin C (ASCORBIC ACID) tablet 250 mg  250 mg Oral Daily Vaughan Basta, MD   250 mg at 12/17/17 1005  . zinc sulfate capsule 220 mg  220 mg Oral Daily Vaughan Basta, MD   220 mg at 12/17/17 1004     Discharge Medications: Please see discharge summary for a list of discharge medications.  Relevant Imaging Results:  Relevant Lab Results:   Additional Information ss: [id number]  Shela Leff, LCSW

## 2017-12-17 NOTE — Clinical Social Work Note (Signed)
CSW has noted that patient and family are wanting patient to go to [facility] for rehab. Patient was made medicare inpatient today at noon. CSW has requested for a PT consult be placed.  Shela Leff MSW,LCSW [phone]

## 2017-12-17 NOTE — Progress Notes (Signed)
[place] at [facility]                                                                                                                                                                                  Patient Demographics   Miguel Torres, is a 84 y.o. male, DOB - Mar 04, 1931, OIN:[id number]  Admit date - 12/16/2017   Admitting Physician Vaughan Basta, MD  Outpatient Primary MD for the patient is [name], [name], MD   LOS - 0  Subjective: Patient doing better shortness of breath improved.  Denies any chest pain.    Review of Systems:   CONSTITUTIONAL: No documented fever. No fatigue, weakness. No weight gain, no weight loss.  EYES: No blurry or double vision.  ENT: No tinnitus. No postnasal drip. No redness of the oropharynx.  RESPIRATORY: No cough, no wheeze, no hemoptysis.  Positive dyspnea.  CARDIOVASCULAR: No chest pain. No orthopnea. No palpitations. No syncope.  GASTROINTESTINAL: No nausea, no vomiting or diarrhea. No abdominal pain. No melena or hematochezia.  GENITOURINARY: No dysuria or hematuria.  ENDOCRINE: No polyuria or nocturia. No heat or cold intolerance.  HEMATOLOGY: No anemia. No bruising. No bleeding.  INTEGUMENTARY: No rashes. No lesions.  MUSCULOSKELETAL: No arthritis. No swelling. No gout.  NEUROLOGIC: No numbness, tingling, or ataxia. No seizure-type activity.  PSYCHIATRIC: No anxiety. No insomnia. No ADD.    Vitals:   Vitals:   12/16/17 1940 12/17/17 0403 12/17/17 0540 12/17/17 0944  BP: 140/70  (!) 159/79 133/72  Pulse: (!) 59  62 82  Resp: 18  18   Temp: 97.8 F (36.6 C)  98.1 F (36.7 C) 97.6 F (36.4 C)  TempSrc: Oral  Oral Oral  SpO2: 97%  100% 99%  Weight:  103.9 kg    Height:        Wt Readings from Last 3 Encounters:  12/17/17 103.9 kg  12/09/17 104 kg  11/23/17 106.6 kg     Intake/Output Summary (Last 24 hours) at 12/17/2017 1356 Last data filed at 12/17/2017 1147 Gross per 24 hour  Intake -   Output 600 ml  Net -600 ml    Physical Exam:   GENERAL: Pleasant-appearing in no apparent distress.  HEAD, EYES, EARS, NOSE AND THROAT: Atraumatic, normocephalic. Extraocular muscles are intact. Pupils equal and reactive to light. Sclerae anicteric. No conjunctival injection. No oro-pharyngeal erythema.  NECK: Supple. There is no jugular venous distention. No bruits, no lymphadenopathy, no thyromegaly.  HEART: Regular rate and rhythm,. No murmurs, no rubs, no clicks.  LUNGS: Crackles at the bases no accessory muscle usage or  aBDOMEN: Soft, flat, nontender, nondistended. Has good bowel sounds. No hepatosplenomegaly appreciated.  EXTREMITIES: No evidence of any cyanosis, clubbing, or positive peripheral edema.  +2 pedal and radial pulses bilaterally.  NEUROLOGIC: The patient is alert, awake, and oriented x3 with no focal motor or sensory deficits appreciated bilaterally.  SKIN: Moist and warm with no rashes appreciated.  Psych: Not anxious, depressed LN: No inguinal LN enlargement    Antibiotics   Anti-infectives (From admission, onward)   None      Medications   Scheduled Meds: . apixaban  2.5 mg Oral BID  . feeding supplement (ENSURE ENLIVE)  237 mL Oral BID BM  . felodipine  5 mg Oral Daily  . ferrous sulfate  325 mg Oral Daily  . furosemide  20 mg Intravenous Q8H  . glipiZIDE  5 mg Oral QAC breakfast  . insulin aspart  0-9 Units Subcutaneous TID WC  . metFORMIN  500 mg Oral Daily  . montelukast  5 mg Oral QHS  . multivitamin with minerals  1 tablet Oral Daily  . nutrition supplement (JUVEN)  1 packet Oral BID BM  . pantoprazole  40 mg Oral Daily  . pravastatin  40 mg Oral Daily  . ascorbic acid  250 mg Oral Daily  . zinc sulfate  220 mg Oral Daily   Continuous Infusions: PRN Meds:.acetaminophen, docusate sodium, HYDROcodone-acetaminophen, polyethylene glycol   Data Review:   Micro Results No results found for this or any previous visit (from the past 240  hour(s)).  Radiology Reports Dg Chest 2 View  Result Date: 12/17/2017 CLINICAL DATA:  CHF. EXAM: CHEST - 2 VIEW COMPARISON:  12/16/2017. FINDINGS: Cardiomegaly with bilateral increased interstitial prominence. CHF could present this fashion. Pneumonitis can't be excluded. Bilateral pleural effusions are noted. No pneumothorax. IMPRESSION: Cardiomegaly with bilateral increase in interstitial prominence and bilateral pleural effusion. Findings most consistent CHF. Electronically Signed   By: Marcello Moores  Register   On: 12/17/2017 07:28   Dg Chest 2 View  Result Date: 12/16/2017 CLINICAL DATA:  Shortness of breath for 1 week EXAM: CHEST - 2 VIEW COMPARISON:  12/07/2017 FINDINGS: Bilateral diffuse interstitial thickening. No focal consolidation, pleural effusion or pneumothorax. Stable cardiomegaly. No acute osseous abnormality. IMPRESSION: Cardiomegaly with mild pulmonary vascular congestion. Electronically Signed   By: Kathreen Devoid   On: 12/16/2017 13:50   Dg Chest 2 View  Result Date: 12/07/2017 CLINICAL DATA:  Weakness.  Hypertension. EXAM: CHEST - 2 VIEW COMPARISON:  November 01, 2017 FINDINGS: There is an equivocal left pleural effusion. There is no edema or consolidation. Heart is upper normal in size with pulmonary vascularity normal. No adenopathy. Bones are osteoporotic. There is degenerative change in the thoracic spine. There is aortic atherosclerosis. IMPRESSION: Equivocal left pleural effusion. No edema or consolidation. Stable cardiac silhouette. There is aortic atherosclerosis. Bones osteoporotic. Aortic Atherosclerosis (ICD10-I70.0). Electronically Signed   By: Lowella Grip III M.D.   On: 12/07/2017 11:18     CBC Recent Labs  Lab 12/16/17 1317 12/17/17 0608  WBC 6.4 8.7  HGB 10.1* 10.5*  HCT 30.0* 30.2*  PLT 169 160  MCV 94.1 92.1  MCH 31.8 31.9  MCHC 33.8 34.7  RDW 15.9* 15.6*    Chemistries  Recent Labs  Lab 12/16/17 1317 12/17/17 0524  NA 139 138  K 4.6 4.5  CL 105  104  CO2 25 28  GLUCOSE 168* 105*  BUN 41* 40*  CREATININE 1.45* 1.39*  CALCIUM 8.5* 8.5*   ------------------------------------------------------------------------------------------------------------------ estimated creatinine clearance is 45.4 mL/min (A) (by C-G formula based on SCr of 1.39 mg/dL (  H)). ------------------------------------------------------------------------------------------------------------------ No results for input(s): HGBA1C in the last 72 hours. ------------------------------------------------------------------------------------------------------------------ No results for input(s): CHOL, HDL, LDLCALC, TRIG, CHOLHDL, LDLDIRECT in the last 72 hours. ------------------------------------------------------------------------------------------------------------------ No results for input(s): TSH, T4TOTAL, T3FREE, THYROIDAB in the last 72 hours.  Invalid input(s): FREET3 ------------------------------------------------------------------------------------------------------------------ No results for input(s): VITAMINB12, FOLATE, FERRITIN, TIBC, IRON, RETICCTPCT in the last 72 hours.  Coagulation profile No results for input(s): INR, PROTIME in the last 168 hours.  No results for input(s): DDIMER in the last 72 hours.  Cardiac Enzymes Recent Labs  Lab 12/16/17 1317 12/16/17 1534  TROPONINI 0.06* 0.06*   ------------------------------------------------------------------------------------------------------------------ Invalid input(s): POCBNP    Assessment & Plan   *Acute on chronic diastolic congestive heart failure Continue IV Lasix Monitor renal function   *Chronic renal failure stage III Continue to monitor closely  *Atrial flutter On beta-blockers and Eliquis. Heart rate is stable.    Appreciate cardiology input  *Hyperlipidemia Continue statin.  *Diabetes He is on oral medications, blood sugar is controlled, keep on sliding scale  coverage.  *CODE STATUS DNR is not interested in hospice following him at the home     Code Status Orders  (From admission, onward)         Start     Ordered   12/16/17 1746  Do not attempt resuscitation (DNR)  Continuous    Question Answer Comment  In the event of cardiac or respiratory ARREST Do not call a "code blue"   In the event of cardiac or respiratory ARREST Do not perform Intubation, CPR, defibrillation or ACLS   In the event of cardiac or respiratory ARREST Use medication by any route, position, wound care, and other measures to relive pain and suffering. May use oxygen, suction and manual treatment of airway obstruction as needed for comfort.      12/16/17 1745        Code Status History    Date Active Date Inactive Code Status Order ID Comments User Context   12/16/2017 1647 12/16/2017 1745 DNR [id number]  Vaughan Basta, MD ED   12/07/2017 1424 12/09/2017 1613 Full Code [id number]  Salary, Avel Peace, MD Inpatient   11/01/2017 1358 11/04/2017 1719 Full Code [id number]  Gladstone Lighter, MD Inpatient    Advance Directive Documentation     Most Recent Value  Type of Advance Directive  Healthcare Power of [place], Living will  Pre-existing out of facility DNR order (yellow form or pink MOST form)  -  "MOST" Form in Place?  -           Consults cardiology  DVT Prophylaxis Eliquis  Lab Results  Component Value Date   PLT 160 12/17/2017     Time Spent in minutes 45 minutes  Greater than 50% of time spent in care coordination and counseling patient regarding the condition and plan of care.   Dustin Flock M.D on 12/17/2017 at 1:56 PM  Between 7am to 6pm - Pager - [phone]  After 6pm go to [email or web address] - Proofreader  [employer]   Office  [phone]

## 2017-12-17 NOTE — Progress Notes (Signed)
Blood sugar is 127 per NT Miguel Torres, will received 1 unit of Novolog.

## 2017-12-17 NOTE — Progress Notes (Signed)
[facility] Encounter Note  Patient: Anterio Scheel Hamric / Admit Date: 12/16/2017 / Date of Encounter: 12/17/2017, 8:37 AM   Subjective: Patient with significant improvements in shortness of breath although lower extremity edema does not appear to be clinically changed.  Patient does feel like he has less edema in his lower extremities at this time.  Previous history of dehydration but now concerning for appropriate treatment with diuresis.  Amlodipine may be contributing to some lower extremity edema.  Tolerating Eliquis without evidence of bleeding complications  Review of Systems: Positive for: Shortness of breath edema weakness Negative for: Vision change, hearing change, syncope, dizziness, nausea, vomiting,diarrhea, bloody stool, stomach pain, cough, congestion, diaphoresis, urinary frequency, urinary pain,skin lesions, skin rashes Others previously listed  Objective: Telemetry: Atrial fibrillation with controlled rate Physical Exam: Blood pressure (!) 159/79, pulse 62, temperature 98.1 F (36.7 C), temperature source Oral, resp. rate 18, height 5\' 8"  (1.727 m), weight 103.9 kg, SpO2 100 %. Body mass index is 34.82 kg/m. General: Well developed, well nourished, in no acute distress. Head: Normocephalic, atraumatic, sclera non-icteric, no xanthomas, nares are without discharge. Neck: No apparent masses Lungs: Normal respirations with no wheezes, no rhonchi, no rales , few crackles   Heart: Irregular rate and rhythm, normal S1 S2, no murmur, no rub, no gallop, PMI is normal size and placement, carotid upstroke normal without bruit, jugular venous pressure normal Abdomen: Soft, non-tender distended with normoactive bowel sounds. No hepatosplenomegaly. Abdominal aorta is normal size without bruit Extremities: 2+ lower extremity left edema, no clubbing, no cyanosis, positive ulcers,  Peripheral: 2+ radial, 2+ femoral, 0 + left dorsal pedal pulses Neuro: Alert and  oriented. Moves all extremities spontaneously. Psych:  Responds to questions appropriately with a normal affect.   Intake/Output Summary (Last 24 hours) at 12/17/2017 0837 Last data filed at 12/16/2017 2302 Gross per 24 hour  Intake -  Output 500 ml  Net -500 ml    Inpatient Medications:  . apixaban  2.5 mg Oral BID  . feeding supplement (ENSURE ENLIVE)  237 mL Oral BID BM  . felodipine  5 mg Oral Daily  . ferrous sulfate  325 mg Oral Daily  . furosemide  20 mg Intravenous Q8H  . glipiZIDE  5 mg Oral QAC breakfast  . insulin aspart  0-9 Units Subcutaneous TID WC  . metFORMIN  500 mg Oral Daily  . montelukast  5 mg Oral QHS  . multivitamin with minerals  1 tablet Oral Daily  . nutrition supplement (JUVEN)  1 packet Oral BID BM  . pantoprazole  40 mg Oral Daily  . pravastatin  40 mg Oral Daily  . ascorbic acid  250 mg Oral Daily  . zinc sulfate  220 mg Oral Daily   Infusions:   Labs: Recent Labs    12/16/17 1317 12/17/17 0524  NA 139 138  K 4.6 4.5  CL 105 104  CO2 25 28  GLUCOSE 168* 105*  BUN 41* 40*  CREATININE 1.45* 1.39*  CALCIUM 8.5* 8.5*   No results for input(s): AST, ALT, ALKPHOS, BILITOT, PROT, ALBUMIN in the last 72 hours. Recent Labs    12/16/17 1317 12/17/17 0608  WBC 6.4 8.7  HGB 10.1* 10.5*  HCT 30.0* 30.2*  MCV 94.1 92.1  PLT 169 160   Recent Labs    12/16/17 1317 12/16/17 1534  TROPONINI 0.06* 0.06*   Invalid input(s): POCBNP No results for input(s): HGBA1C in the last 72 hours.   Weights: [employer]  12/16/17 1242 12/16/17 1800 12/17/17 0403  Weight: 106.1 kg 107.3 kg 103.9 kg     Radiology/Studies:  Dg Chest 2 View  Result Date: 12/17/2017 CLINICAL DATA:  CHF. EXAM: CHEST - 2 VIEW COMPARISON:  12/16/2017. FINDINGS: Cardiomegaly with bilateral increased interstitial prominence. CHF could present this fashion. Pneumonitis can't be excluded. Bilateral pleural effusions are noted. No pneumothorax. IMPRESSION: Cardiomegaly with  bilateral increase in interstitial prominence and bilateral pleural effusion. Findings most consistent CHF. Electronically Signed   By: Marcello Moores  Register   On: 12/17/2017 07:28   Dg Chest 2 View  Result Date: 12/16/2017 CLINICAL DATA:  Shortness of breath for 1 week EXAM: CHEST - 2 VIEW COMPARISON:  12/07/2017 FINDINGS: Bilateral diffuse interstitial thickening. No focal consolidation, pleural effusion or pneumothorax. Stable cardiomegaly. No acute osseous abnormality. IMPRESSION: Cardiomegaly with mild pulmonary vascular congestion. Electronically Signed   By: Kathreen Devoid   On: 12/16/2017 13:50   Dg Chest 2 View  Result Date: 12/07/2017 CLINICAL DATA:  Weakness.  Hypertension. EXAM: CHEST - 2 VIEW COMPARISON:  November 01, 2017 FINDINGS: There is an equivocal left pleural effusion. There is no edema or consolidation. Heart is upper normal in size with pulmonary vascularity normal. No adenopathy. Bones are osteoporotic. There is degenerative change in the thoracic spine. There is aortic atherosclerosis. IMPRESSION: Equivocal left pleural effusion. No edema or consolidation. Stable cardiac silhouette. There is aortic atherosclerosis. Bones osteoporotic. Aortic Atherosclerosis (ICD10-I70.0). Electronically Signed   By: Lowella Grip III M.D.   On: 12/07/2017 11:18     Assessment and Recommendation  84 y.o. male with acute on chronic diastolic dysfunction congestive heart failure multifactorial in nature including essential hypertension mixed hyperlipidemia diabetes dependent edema atrial fibrillation and chronic kidney disease stage III without evidence of myocardial infarction at this time but elevated troponin consistent with demand ischemia 1.  Continue intravenous furosemide for acute on chronic diastolic dysfunction congestive heart failure 2.  Continue Unna boot and compression and left lower extremity 3.  Consider discontinuation of amlodipine due to lower extremity edema side effects 4.  High  intensity cholesterol therapy 5.  Anticoagulation for further risk reduction in stroke with atrial fibrillation without change 6.  Begin ambulation and further dietary restrictions of significant sodium intake to reduce the possibility of readmission 7.  Adjustments of medications as per above with ambulation currently no further cardiac diagnostics necessary at this time 8.  Possible discharged home in a.m. if improved with follow-up next week for further adjustments of medication  Signed, Serafina Royals M.D. FACC

## 2017-12-18 ENCOUNTER — Inpatient Hospital Stay: Payer: Medicare Other

## 2017-12-18 ENCOUNTER — Encounter: Payer: Self-pay | Admitting: Urology

## 2017-12-18 DIAGNOSIS — N410 Acute prostatitis: Secondary | ICD-10-CM

## 2017-12-18 DIAGNOSIS — R338 Other retention of urine: Secondary | ICD-10-CM

## 2017-12-18 DIAGNOSIS — R339 Retention of urine, unspecified: Secondary | ICD-10-CM | POA: Diagnosis not present

## 2017-12-18 DIAGNOSIS — N5089 Other specified disorders of the male genital organs: Secondary | ICD-10-CM

## 2017-12-18 DIAGNOSIS — N430 Encysted hydrocele: Secondary | ICD-10-CM

## 2017-12-18 LAB — BASIC METABOLIC PANEL
Anion gap: 10 (ref 5–15)
BUN: 52 mg/dL — AB (ref 8–23)
CALCIUM: 8.6 mg/dL — AB (ref 8.9–10.3)
CO2: 29 mmol/L (ref 22–32)
CREATININE: 1.74 mg/dL — AB (ref 0.61–1.24)
Chloride: 97 mmol/L — ABNORMAL LOW (ref 98–111)
GFR calc non Af Amer: 34 mL/min — ABNORMAL LOW (ref >60)
GFR, EST AFRICAN AMERICAN: 39 mL/min — AB (ref >60)
Glucose, Bld: 101 mg/dL — ABNORMAL HIGH (ref 70–99)
Potassium: 4.2 mmol/L (ref 3.5–5.1)
SODIUM: 136 mmol/L (ref 135–145)

## 2017-12-18 LAB — GLUCOSE, CAPILLARY
GLUCOSE-CAPILLARY: 86 mg/dL (ref 70–99)
Glucose-Capillary: 217 mg/dL — ABNORMAL HIGH (ref 70–99)
Glucose-Capillary: 275 mg/dL — ABNORMAL HIGH (ref 70–99)
Glucose-Capillary: 209 mg/dL — ABNORMAL HIGH (ref 70–99)

## 2017-12-18 MED ORDER — SODIUM CHLORIDE 0.9 % IV SOLN
100.0000 mg | Freq: Two times a day (BID) | INTRAVENOUS | Status: DC
  Administered 2017-12-18 – 2017-12-21 (×6): 100 mg via INTRAVENOUS
  Filled 2017-12-18 (×9): qty 100

## 2017-12-18 MED ORDER — HYDROCODONE-ACETAMINOPHEN 5-325 MG PO TABS
1.0000 | ORAL_TABLET | ORAL | Status: DC | PRN
Start: 2017-12-18 — End: 2017-12-21
  Administered 2017-12-21: 1 via ORAL
  Filled 2017-12-18: qty 1

## 2017-12-18 MED ORDER — TAMSULOSIN HCL 0.4 MG PO CAPS
0.4000 mg | ORAL_CAPSULE | Freq: Every day | ORAL | Status: DC
  Administered 2017-12-18 – 2017-12-21 (×4): 0.4 mg via ORAL
  Filled 2017-12-18 (×4): qty 1

## 2017-12-18 NOTE — Evaluation (Signed)
Physical Therapy Evaluation Patient Details Name: Miguel Torres MRN: [id number] DOB: 1930-05-31 Today's Date: 12/18/2017   History of Present Illness  Patient is an 84 year old male admitted with CHF.  PMH includes R AKA, chronic ulceration of L foot, skin CA, Htn, HLD, DM and anemia.  Clinical Impression  Pt eager to get up and work with PT however he had some impulsivity and poor awareness that was concerning.  Pt needed to use the bathroom, after finishing PT found that he had feces all over his hands and thigh and despite a lot of assist and cuing to clean up pt did not show particularly good awareness that there was a problem or how to address this issue, PT gave considerable assist to clean up.  Pt was able to do most of the work donning prosthetic, managed ambulation with walker confidently (despite limited distance and showing poor posture, awareness, walker use, etc).  He is not at all safe to return home alone at this point and will need STR if he is to get back to PLOF.      Follow Up Recommendations SNF    Equipment Recommendations  None recommended by PT    Recommendations for Other Services       Precautions / Restrictions Precautions Precautions: Fall Required Braces or Orthoses: (R prosthetic) Restrictions Weight Bearing Restrictions: No      Mobility  Bed Mobility Overal bed mobility: Needs Assistance Bed Mobility: Supine to Sit     Supine to sit: Min assist     General bed mobility comments: Heavy reliance on the rails, needed only light assist.  PT needed to help don R prosthetic, however pt did majority of the task  Transfers Overall transfer level: Needs assistance Equipment used: Rolling walker (2 wheeled) Transfers: Sit to/from Stand Sit to Stand: Mod assist;From elevated surface         General transfer comment: Pt struggled to get to standing from both the elevated bed and the commode   Ambulation/Gait Ambulation/Gait assistance: Min  assist Gait Distance (Feet): 30 Feet Assistive device: Rolling walker (2 wheeled)       General Gait Details: Pt with impulsive, at times unsafe gait with forward flexed posture, poor LE advancement but generally speaking he did not have LOBs or show poor confidence (even if at times misplaced)  Stairs            Wheelchair Mobility    Modified Rankin (Stroke Patients Only)       Balance     Sitting balance-Leahy Scale: Good       Standing balance-Leahy Scale: Fair                               Pertinent Vitals/Pain Pain Assessment: No/denies pain    Home Living Family/patient expects to be discharged to:: Skilled nursing facility Living Arrangements: Alone Available Help at Discharge: Family;Available PRN/intermittently Type of Home: House Home Access: Level entry;Ramped entrance     Home Layout: One level Home Equipment: Walker - 2 wheels;Grab bars - toilet      Prior Function Level of Independence: Independent with assistive device(s)         Comments: uses walker in the home, rarely out of the house     Hand Dominance        Extremity/Trunk Assessment   Upper Extremity Assessment Upper Extremity Assessment: Generalized weakness    Lower Extremity Assessment Lower  Extremity Assessment: Generalized weakness       Communication   Communication: HOH  Cognition Arousal/Alertness: Awake/alert Behavior During Therapy: Impulsive Overall Cognitive Status: Difficult to assess                                        General Comments      Exercises     Assessment/Plan    PT Assessment Patient needs continued PT services  PT Problem List Decreased balance;Decreased knowledge of use of DME       PT Treatment Interventions DME instruction;Therapeutic activities;Gait training;Therapeutic exercise;Balance training;Functional mobility training;Neuromuscular re-education    PT Goals (Current goals can be found  in the Care Plan section)  Acute Rehab PT Goals Patient Stated Goal: To return to independent lifestyle. PT Goal Formulation: With patient Time For Goal Achievement: 01/01/18 Potential to Achieve Goals: Fair    Frequency Min 2X/week   Barriers to discharge        Co-evaluation               AM-PAC PT "6 Clicks" Daily Activity  Outcome Measure Difficulty turning over in bed (including adjusting bedclothes, sheets and blankets)?: A Little Difficulty moving from lying on back to sitting on the side of the bed? : Unable Difficulty sitting down on and standing up from a chair with arms (e.g., wheelchair, bedside commode, etc,.)?: Unable Help needed moving to and from a bed to chair (including a wheelchair)?: A Little Help needed walking in hospital room?: A Lot Help needed climbing 3-5 steps with a railing? : A Lot 6 Click Score: 12    End of Session Equipment Utilized During Treatment: Gait belt Activity Tolerance: Patient limited by fatigue;Patient tolerated treatment well Patient left: with call bell/phone within reach;with chair alarm set Nurse Communication: Mobility status PT Visit Diagnosis: Unsteadiness on feet (R26.81);Muscle weakness (generalized) (M62.81);Difficulty in walking, not elsewhere classified (R26.2)    Time: 1031-5945 PT Time Calculation (min) (ACUTE ONLY): 32 min   Charges:   PT Evaluation $PT Eval Low Complexity: 1 Low PT Treatments $Therapeutic Activity: 8-22 mins        Kreg Shropshire, DPT 12/18/2017, 4:34 PM

## 2017-12-18 NOTE — Clinical Social Work Note (Signed)
Clinical Social Work Assessment  Patient Details  Name: Miguel Torres MRN: [id number] Date of Birth: 28-Jul-1930  Date of referral:  12/18/17               Reason for consult:  Facility Placement                Permission sought to share information with:  Facility Sport and exercise psychologist, Family Supports Permission granted to share information::  Yes, Verbal Permission Granted  Name::        Agency::     Relationship::     Contact Information:     Housing/Transportation Living arrangements for the past 2 months:  Single Family Home Source of Information:  Patient, Adult Children Patient Interpreter Needed:  None Criminal Activity/Legal Involvement Pertinent to Current Situation/Hospitalization:  No - Comment as needed Significant Relationships:  Adult Children Lives with:  Self Do you feel safe going back to the place where you live?    Need for family participation in patient care:  Yes (Comment)  Care giving concerns:  Patient resides alone. His wife is at [facility] under long term care.   Social Worker assessment / plan:  CSW met with patient and his daughter: Miguel Torres: [phone], in patient's room this morning. Patient is very hard of hearing. Patient confirmed he wishes to go to rehab at discharge and wishes to go to [facility]. CSW explained to both him and his daughter that PT would need to assess. CSW also explained that patient was changed to inpatient yesterday and explained that if he medically needed to stay as inpatient for 3 nights then his medicare would pay however, CSW then informed them that if he does not need to remain for 3 nights as inpatient and is medically cleared for discharge prior to this, that he would either need to return home with services or pay out of pocket. Patient's daughter verbalized understanding.   Employment status:    Insurance information:  Medicare PT Recommendations:  Not assessed at this time Information / Referral  to community resources:     Patient/Family's Response to care:  Patient and daughter expressed appreciation for CSW assistance.  Patient/Family's Understanding of and Emotional Response to Diagnosis, Current Treatment, and Prognosis:  Patient was extremely hard of hearing and stated he did not sleep last night due to pain but then his daughter spoke up and stated that the nurse is aware and that they were going to tell the physician when he rounded.   Emotional Assessment Appearance:  Appears stated age Attitude/Demeanor/Rapport:  (pleasant and cooperative) Affect (typically observed):  Accepting, Adaptable, Calm Orientation:  Oriented to Self, Oriented to Place, Oriented to  Time, Oriented to Situation Alcohol / Substance use:  Not Applicable Psych involvement (Current and /or in the community):  No (Comment)  Discharge Needs  Concerns to be addressed:  Care Coordination Readmission within the last 30 days:  No Current discharge risk:  None Barriers to Discharge:  No Barriers Identified   Shela Leff, LCSW 12/18/2017, 10:01 AM

## 2017-12-18 NOTE — Progress Notes (Signed)
[place] at [facility]                                                                                                                                                                                  Patient Demographics   Miguel Torres, is a 84 y.o. male, DOB - May 23, 1930, ZJQ:[id number]  Admit date - 12/16/2017   Admitting Physician Vaughan Basta, MD  Outpatient Primary MD for the patient is [name], [name], MD   LOS - 1  Subjective: Patient now complains of pain in his groin area lower abdomen area states that he thinks he may have a renal stone. Patient also has scrotal swelling which according to the daughter he has had.  It is asymmetrical.  Review of Systems:   CONSTITUTIONAL: No documented fever. No fatigue, weakness. No weight gain, no weight loss.  EYES: No blurry or double vision.  ENT: No tinnitus. No postnasal drip. No redness of the oropharynx.  RESPIRATORY: No cough, no wheeze, no hemoptysis.  Positive dyspnea.  CARDIOVASCULAR: No chest pain. No orthopnea. No palpitations. No syncope.  GASTROINTESTINAL: No nausea, no vomiting or diarrhea. No abdominal pain. No melena or hematochezia.  GENITOURINARY: No dysuria or hematuria.  Positive scrotal pain ENDOCRINE: No polyuria or nocturia. No heat or cold intolerance.  HEMATOLOGY: No anemia. No bruising. No bleeding.  INTEGUMENTARY: No rashes. No lesions.  MUSCULOSKELETAL: No arthritis. No swelling. No gout.  NEUROLOGIC: No numbness, tingling, or ataxia. No seizure-type activity.  PSYCHIATRIC: No anxiety. No insomnia. No ADD.    Vitals:   Vitals:   12/17/17 2013 12/18/17 0351 12/18/17 0403 12/18/17 0740  BP: 120/61 134/73  (!) 153/66  Pulse: 62 83  70  Resp: 18 18  16   Temp: 97.6 F (36.4 C) 97.9 F (36.6 C)  97.7 F (36.5 C)  TempSrc: Oral Oral  Oral  SpO2: 97% 93%  95%  Weight:   103.9 kg   Height:        Wt Readings from Last 3 Encounters:  12/18/17 103.9 kg  12/09/17  104 kg  11/23/17 106.6 kg     Intake/Output Summary (Last 24 hours) at 12/18/2017 1344 Last data filed at 12/18/2017 1012 Gross per 24 hour  Intake 480 ml  Output 675 ml  Net -195 ml    Physical Exam:   GENERAL: Pleasant-appearing in no apparent distress.  HEAD, EYES, EARS, NOSE AND THROAT: Atraumatic, normocephalic. Extraocular muscles are intact. Pupils equal and reactive to light. Sclerae anicteric. No conjunctival injection. No oro-pharyngeal erythema.  NECK: Supple. There is no jugular venous distention. No bruits, no lymphadenopathy, no thyromegaly.  HEART: Regular rate and rhythm,. No murmurs,  no rubs, no clicks.  LUNGS: Crackles at the bases no accessory muscle usage or  aBDOMEN: Soft, flat, nontender, nondistended. Has good bowel sounds. No hepatosplenomegaly appreciated.  EXTREMITIES: No evidence of any cyanosis, clubbing, or positive peripheral edema.  +2 pedal and radial pulses bilaterally.  NEUROLOGIC: The patient is alert, awake, and oriented x3 with no focal motor or sensory deficits appreciated bilaterally.  SKIN: Moist and warm with no rashes appreciated.  Psych: Not anxious, depressed LN: No inguinal LN enlargement + Positive scrotal swelling asymmetrical   Antibiotics   Anti-infectives (From admission, onward)   Start     Dose/Rate Route Frequency Ordered Stop   12/18/17 1100  doxycycline (VIBRAMYCIN) 100 mg in sodium chloride 0.9 % 250 mL IVPB     100 mg 125 mL/hr over 120 Minutes Intravenous Every 12 hours 12/18/17 1001        Medications   Scheduled Meds: . apixaban  2.5 mg Oral BID  . feeding supplement (ENSURE ENLIVE)  237 mL Oral BID BM  . felodipine  5 mg Oral Daily  . ferrous sulfate  325 mg Oral Daily  . glipiZIDE  5 mg Oral QAC breakfast  . insulin aspart  0-9 Units Subcutaneous TID WC  . montelukast  5 mg Oral QHS  . multivitamin with minerals  1 tablet Oral Daily  . nutrition supplement (JUVEN)  1 packet Oral BID BM  . pantoprazole  40 mg  Oral Daily  . pravastatin  40 mg Oral Daily  . tamsulosin  0.4 mg Oral Daily  . ascorbic acid  250 mg Oral Daily  . zinc sulfate  220 mg Oral Daily   Continuous Infusions: . doxycycline (VIBRAMYCIN) IV 100 mg (12/18/17 1221)   PRN Meds:.acetaminophen, docusate sodium, HYDROcodone-acetaminophen, polyethylene glycol   Data Review:   Micro Results No results found for this or any previous visit (from the past 240 hour(s)).  Radiology Reports Dg Chest 2 View  Result Date: 12/17/2017 CLINICAL DATA:  CHF. EXAM: CHEST - 2 VIEW COMPARISON:  12/16/2017. FINDINGS: Cardiomegaly with bilateral increased interstitial prominence. CHF could present this fashion. Pneumonitis can't be excluded. Bilateral pleural effusions are noted. No pneumothorax. IMPRESSION: Cardiomegaly with bilateral increase in interstitial prominence and bilateral pleural effusion. Findings most consistent CHF. Electronically Signed   By: Marcello Moores  Register   On: 12/17/2017 07:28   Dg Chest 2 View  Result Date: 12/16/2017 CLINICAL DATA:  Shortness of breath for 1 week EXAM: CHEST - 2 VIEW COMPARISON:  12/07/2017 FINDINGS: Bilateral diffuse interstitial thickening. No focal consolidation, pleural effusion or pneumothorax. Stable cardiomegaly. No acute osseous abnormality. IMPRESSION: Cardiomegaly with mild pulmonary vascular congestion. Electronically Signed   By: Kathreen Devoid   On: 12/16/2017 13:50   Dg Chest 2 View  Result Date: 12/07/2017 CLINICAL DATA:  Weakness.  Hypertension. EXAM: CHEST - 2 VIEW COMPARISON:  November 01, 2017 FINDINGS: There is an equivocal left pleural effusion. There is no edema or consolidation. Heart is upper normal in size with pulmonary vascularity normal. No adenopathy. Bones are osteoporotic. There is degenerative change in the thoracic spine. There is aortic atherosclerosis. IMPRESSION: Equivocal left pleural effusion. No edema or consolidation. Stable cardiac silhouette. There is aortic atherosclerosis.  Bones osteoporotic. Aortic Atherosclerosis (ICD10-I70.0). Electronically Signed   By: Lowella Grip III M.D.   On: 12/07/2017 11:18   US Pelvis Limited (transabdominal Only)  Result Date: 12/18/2017 CLINICAL DATA:  Urinary retention. EXAM: LIMITED ULTRASOUND OF PELVIS TECHNIQUE: Limited transabdominal ultrasound examination of the pelvis  was performed. COMPARISON:  No recent prior pelvic ultrasound. FINDINGS: The urinary bladder has a normal appearance for the degree of distention. Pre-void volume:  575 ml Post-void volume: Patient could not void. Other findings:  None. IMPRESSION: Prevoid volume of 575 mL.  Patient could not void. Electronically Signed   By: Marcello Moores  Register   On: 12/18/2017 12:57   US Scrotum W/doppler  Result Date: 12/18/2017 CLINICAL DATA:  Swelling EXAM: SCROTAL ULTRASOUND DOPPLER ULTRASOUND OF THE TESTICLES TECHNIQUE: Complete ultrasound examination of the testicles, epididymis, and other scrotal structures was performed. Color and spectral Doppler ultrasound were also utilized to evaluate blood flow to the testicles. COMPARISON:  None. FINDINGS: Right testicle Measurements: 4.3 x 2.2 x 3.6 cm. No mass or microlithiasis visualized. Left testicle Measurements: 4.9 x 2.4 x 2.9 cm. No mass or microlithiasis visualized. Right epididymis:  Small complex cyst measures 5 mm. Left epididymis:  Not well visualized Hydrocele: Large left hydrocele with internal echoes. This compresses the left testicle along the scrotal wall. Varicocele:  None visualized. Pulsed Doppler interrogation of both testes demonstrates normal low resistance arterial and venous waveforms bilaterally. There is a small cystic area within the left scrotum adjacent to the left testicle with mural calcification. This area measures 1.8 x 1.4 x 1.3 cm. This appears to be external to the testicle. IMPRESSION: No visible testicular abnormality. Very large left hydrocele with internal echoes/debris. Small cystic structure  adjacent to the left testicle which appears to be external to the testicle. Small mural calcifications/nodule. This is of unknown etiology but appears benign. This could be followed with repeat ultrasound in 3-6 months to ensure stability. Electronically Signed   By: Rolm Baptise M.D.   On: 12/18/2017 12:22     CBC Recent Labs  Lab 12/16/17 1317 12/17/17 0608  WBC 6.4 8.7  HGB 10.1* 10.5*  HCT 30.0* 30.2*  PLT 169 160  MCV 94.1 92.1  MCH 31.8 31.9  MCHC 33.8 34.7  RDW 15.9* 15.6*    Chemistries  Recent Labs  Lab 12/16/17 1317 12/17/17 0524 12/18/17 0546  NA 139 138 136  K 4.6 4.5 4.2  CL 105 104 97*  CO2 25 28 29   GLUCOSE 168* 105* 101*  BUN 41* 40* 52*  CREATININE 1.45* 1.39* 1.74*  CALCIUM 8.5* 8.5* 8.6*   ------------------------------------------------------------------------------------------------------------------ estimated creatinine clearance is 36.3 mL/min (A) (by C-G formula based on SCr of 1.74 mg/dL (H)). ------------------------------------------------------------------------------------------------------------------ No results for input(s): HGBA1C in the last 72 hours. ------------------------------------------------------------------------------------------------------------------ No results for input(s): CHOL, HDL, LDLCALC, TRIG, CHOLHDL, LDLDIRECT in the last 72 hours. ------------------------------------------------------------------------------------------------------------------ No results for input(s): TSH, T4TOTAL, T3FREE, THYROIDAB in the last 72 hours.  Invalid input(s): FREET3 ------------------------------------------------------------------------------------------------------------------ No results for input(s): VITAMINB12, FOLATE, FERRITIN, TIBC, IRON, RETICCTPCT in the last 72 hours.  Coagulation profile No results for input(s): INR, PROTIME in the last 168 hours.  No results for input(s): DDIMER in the last 72 hours.  Cardiac  Enzymes Recent Labs  Lab 12/16/17 1317 12/16/17 1534  TROPONINI 0.06* 0.06*   ------------------------------------------------------------------------------------------------------------------ Invalid input(s): POCBNP    Assessment & Plan   *Acute on chronic diastolic congestive heart failure Discontinue Lasix due to creatinine increasing   *Acute on chronic renal failure stage III Renal function worse we checked a renal ultrasound which shows urinary retention.  I will start patient on Flomax I discussed the case with urology they will see the patient he may need Foley placement  *Acute urinary retention start patient on Flomax  *Possible epididymitis with  pain I will start patient on doxycycline  *Atrial flutter On beta-blockers and Eliquis. Heart rate is stable.    Appreciate cardiology input  *Hyperlipidemia Continue statin.  *Diabetes He is on oral medications, blood sugar is controlled, keep on sliding scale coverage.  *CODE STATUS DNR is not interested in hospice following him at the home     Code Status Orders  (From admission, onward)         Start     Ordered   12/16/17 1746  Do not attempt resuscitation (DNR)  Continuous    Question Answer Comment  In the event of cardiac or respiratory ARREST Do not call a "code blue"   In the event of cardiac or respiratory ARREST Do not perform Intubation, CPR, defibrillation or ACLS   In the event of cardiac or respiratory ARREST Use medication by any route, position, wound care, and other measures to relive pain and suffering. May use oxygen, suction and manual treatment of airway obstruction as needed for comfort.      12/16/17 1745        Code Status History    Date Active Date Inactive Code Status Order ID Comments User Context   12/16/2017 1647 12/16/2017 1745 DNR [id number]  Vaughan Basta, MD ED   12/07/2017 1424 12/09/2017 1613 Full Code [id number]  Salary, Avel Peace, MD Inpatient   11/01/2017 1358  11/04/2017 1719 Full Code [id number]  Gladstone Lighter, MD Inpatient    Advance Directive Documentation     Most Recent Value  Type of Advance Directive  Healthcare Power of [place], Living will  Pre-existing out of facility DNR order (yellow form or pink MOST form)  -  "MOST" Form in Place?  -           Consults cardiology  DVT Prophylaxis Eliquis  Lab Results  Component Value Date   PLT 160 12/17/2017     Time Spent in minutes 35 minutes  Greater than 50% of time spent in care coordination and counseling patient regarding the condition and plan of care.   Dustin Flock M.D on 12/18/2017 at 1:44 PM  Between 7am to 6pm - Pager - [phone]  After 6pm go to [email or web address] - Proofreader  [employer]   Office  [phone]

## 2017-12-18 NOTE — Consult Note (Signed)
Subjective: CC: Scrotal swelling and retention.  Hx: I was asked to see Miguel Torres in consultation by Dr. Posey Pronto for scrotal edema with difficulty foley placement and an elevated PVR of 755ml on bladder scan. The patient reports no prior GU history and was voiding well until today when he had increased difficulty and suprapubic pain.   A bladder scan demonstrated an elevated PVR of 733ml which was confirmed on bladder US.  Scrotal US showed a testicular cyst on the left with mural calcification and a large hydrocele but no evidence of infection and the patient reports a decline in the scrotal edema and has no scrotal tenderness.   He has had some chronic scrotal swelling on the left.  He has no associated signs or symtoms.  ROS:  Review of Systems  Constitutional: Negative for chills and fever.  Respiratory: Positive for shortness of breath.   Cardiovascular: Positive for leg swelling. Negative for chest pain.  All other systems reviewed and are negative.   No Known Allergies  Past Medical History:  Diagnosis Date  . Anemia   . CHF (congestive heart failure) ([place])   . Diabetes mellitus without complication ([place])   . Hyperlipidemia   . Hypertension   . Skin cancer     Past Surgical History:  Procedure Laterality Date  . BASAL CELL CARCINOMA EXCISION    . History of eye surgery Bilateral   . MOHS SURGERY  04/2011   the top of his head  . Right BKA  09/12/2010   Dr. Delana Meyer; Secondary gangrenous right LE  . TONSILLECTOMY      Social History   Socioeconomic History  . Marital status: Married    Spouse name: Not on file  . Number of children: 5  . Years of education: 5th grade  . Highest education level: Not on file  Occupational History  . Occupation: Retired  Scientific laboratory technician  . Financial resource strain: Not on file  . Food insecurity:    Worry: Not on file    Inability: Not on file  . Transportation needs:    Medical: Not on file    Non-medical: Not on file   Tobacco Use  . Smoking status: Never Smoker  . Smokeless tobacco: Never Used  Substance and Sexual Activity  . Alcohol use: No  . Drug use: No  . Sexual activity: Not on file  Lifestyle  . Physical activity:    Days per week: Not on file    Minutes per session: Not on file  . Stress: Not on file  Relationships  . Social connections:    Talks on phone: Not on file    Gets together: Not on file    Attends religious service: Not on file    Active member of club or organization: Not on file    Attends meetings of clubs or organizations: Not on file    Relationship status: Not on file  . Intimate partner violence:    Fear of current or ex partner: Not on file    Emotionally abused: Not on file    Physically abused: Not on file    Forced sexual activity: Not on file  Other Topics Concern  . Not on file  Social History Narrative   Lives at home by himself.  Ambulates with a walker   Has a  right prosthetic leg    Family History  Problem Relation Age of Onset  . Diabetes Mother   . Arthritis Sister   .  Diabetes Daughter     Anti-infectives: Anti-infectives (From admission, onward)   Start     Dose/Rate Route Frequency Ordered Stop   12/18/17 1100  doxycycline (VIBRAMYCIN) 100 mg in sodium chloride 0.9 % 250 mL IVPB     100 mg 125 mL/hr over 120 Minutes Intravenous Every 12 hours 12/18/17 1001        Current Facility-Administered Medications  Medication Dose Route Frequency Provider Last Rate Last Dose  . acetaminophen (TYLENOL) tablet 650 mg  650 mg Oral Q6H PRN Vaughan Basta, MD      . apixaban Arne [place]) tablet 2.5 mg  2.5 mg Oral BID Vaughan Basta, MD   2.5 mg at 12/18/17 1014  . docusate sodium (COLACE) capsule 100 mg  100 mg Oral BID PRN Vaughan Basta, MD      . doxycycline (VIBRAMYCIN) 100 mg in sodium chloride 0.9 % 250 mL IVPB  100 mg Intravenous Q12H Dustin Flock, MD   Stopped at 12/18/17 1425  . feeding supplement (ENSURE ENLIVE)  (ENSURE ENLIVE) liquid 237 mL  237 mL Oral BID BM Vaughan Basta, MD   237 mL at 12/18/17 1340  . felodipine (PLENDIL) 24 hr tablet 5 mg  5 mg Oral Daily Vaughan Basta, MD   5 mg at 12/18/17 1014  . ferrous sulfate tablet 325 mg  325 mg Oral Daily Vaughan Basta, MD   325 mg at 12/18/17 1014  . HYDROcodone-acetaminophen (NORCO/VICODIN) 5-325 MG per tablet 1-2 tablet  1-2 tablet Oral Q4H PRN Dustin Flock, MD      . insulin aspart (novoLOG) injection 0-9 Units  0-9 Units Subcutaneous TID WC Vaughan Basta, MD   5 Units at 12/18/17 1627  . montelukast (SINGULAIR) tablet 5 mg  5 mg Oral QHS Vaughan Basta, MD   5 mg at 12/17/17 2052  . multivitamin with minerals tablet 1 tablet  1 tablet Oral Daily Vaughan Basta, MD   1 tablet at 12/18/17 1014  . nutrition supplement (JUVEN) (JUVEN) powder packet 1 packet  1 packet Oral BID BM Vaughan Basta, MD   1 packet at 12/18/17 1340  . pantoprazole (PROTONIX) EC tablet 40 mg  40 mg Oral Daily Vaughan Basta, MD   40 mg at 12/18/17 1014  . polyethylene glycol (MIRALAX / GLYCOLAX) packet 17 g  17 g Oral Daily PRN Vaughan Basta, MD      . pravastatin (PRAVACHOL) tablet 40 mg  40 mg Oral Daily Vaughan Basta, MD   40 mg at 12/18/17 1014  . tamsulosin (FLOMAX) capsule 0.4 mg  0.4 mg Oral Daily Dustin Flock, MD   0.4 mg at 12/18/17 1220  . vitamin C (ASCORBIC ACID) tablet 250 mg  250 mg Oral Daily Vaughan Basta, MD   250 mg at 12/18/17 1014  . zinc sulfate capsule 220 mg  220 mg Oral Daily Vaughan Basta, MD   220 mg at 12/18/17 1014     Objective: Vital signs in last 24 hours: Temp:  [97.6 F (36.4 C)-98.1 F (36.7 C)] 98.1 F (36.7 C) (08/16 1654) Pulse Rate:  [62-83] 73 (08/16 1654) Resp:  [16-18] 18 (08/16 1654) BP: (120-156)/(61-81) 156/81 (08/16 1654) SpO2:  [93 %-100 %] 100 % (08/16 1654) Weight:  [103.9 kg] 103.9 kg (08/16 0403)  Intake/Output  from previous day: 08/15 0701 - 08/16 0700 In: -  Out: 353 [Urine:775] Intake/Output this shift: Total I/O In: 480 [P.O.:480] Out: -    Physical Exam  Constitutional: He is oriented to person, place, and time. He  appears well-developed and well-nourished.  HENT:  Head: Normocephalic and atraumatic.  With skin changes on the scalp from prior skin cancer treatment and a horn like lesion toward the front.    Neck: Normal range of motion. Neck supple.  Cardiovascular: Normal rate, regular rhythm and normal heart sounds.  Pulmonary/Chest: Effort normal and breath sounds normal. No respiratory distress.  Abdominal: Soft. He exhibits mass (suprapubic). There is tenderness.  Genitourinary:  Genitourinary Comments: Uncircumicised phallus with an adequate meatus.  There is scrotal edema but no phimosis or balanitis. There is moderate scrotal edema with a left hydrocele.  The testicle on the left is not readily palpable.  Right testicle an epididymis are normal.    Prostate not examined due to patient immobility and position.   Musculoskeletal: Normal range of motion. He exhibits edema and deformity (left BKA). He exhibits no tenderness.  Neurological: He is alert and oriented to person, place, and time.  Skin: Skin is warm and dry.  Skin graft site on left ant thigh.  Scalp lesions as noted.   Psychiatric: He has a normal mood and affect.    Lab Results:  Recent Labs    12/16/17 1317 12/17/17 0608  WBC 6.4 8.7  HGB 10.1* 10.5*  HCT 30.0* 30.2*  PLT 169 160   BMET Recent Labs    12/17/17 0524 12/18/17 0546  NA 138 136  K 4.5 4.2  CL 104 97*  CO2 28 29  GLUCOSE 105* 101*  BUN 40* 52*  CREATININE 1.39* 1.74*  CALCIUM 8.5* 8.6*   PT/INR No results for input(s): LABPROT, INR in the last 72 hours. ABG No results for input(s): PHART, HCO3 in the last 72 hours.  Invalid input(s): PCO2, PO2  Studies/Results: Dg Chest 2 View  Result Date: 12/17/2017 CLINICAL DATA:  CHF.  EXAM: CHEST - 2 VIEW COMPARISON:  12/16/2017. FINDINGS: Cardiomegaly with bilateral increased interstitial prominence. CHF could present this fashion. Pneumonitis can't be excluded. Bilateral pleural effusions are noted. No pneumothorax. IMPRESSION: Cardiomegaly with bilateral increase in interstitial prominence and bilateral pleural effusion. Findings most consistent CHF. Electronically Signed   By: Marcello Moores  Register   On: 12/17/2017 07:28   US Pelvis Limited (transabdominal Only)  Result Date: 12/18/2017 CLINICAL DATA:  Urinary retention. EXAM: LIMITED ULTRASOUND OF PELVIS TECHNIQUE: Limited transabdominal ultrasound examination of the pelvis was performed. COMPARISON:  No recent prior pelvic ultrasound. FINDINGS: The urinary bladder has a normal appearance for the degree of distention. Pre-void volume:  575 ml Post-void volume: Patient could not void. Other findings:  None. IMPRESSION: Prevoid volume of 575 mL.  Patient could not void. Electronically Signed   By: Marcello Moores  Register   On: 12/18/2017 12:57   US Scrotum W/doppler  Result Date: 12/18/2017 CLINICAL DATA:  Swelling EXAM: SCROTAL ULTRASOUND DOPPLER ULTRASOUND OF THE TESTICLES TECHNIQUE: Complete ultrasound examination of the testicles, epididymis, and other scrotal structures was performed. Color and spectral Doppler ultrasound were also utilized to evaluate blood flow to the testicles. COMPARISON:  None. FINDINGS: Right testicle Measurements: 4.3 x 2.2 x 3.6 cm. No mass or microlithiasis visualized. Left testicle Measurements: 4.9 x 2.4 x 2.9 cm. No mass or microlithiasis visualized. Right epididymis:  Small complex cyst measures 5 mm. Left epididymis:  Not well visualized Hydrocele: Large left hydrocele with internal echoes. This compresses the left testicle along the scrotal wall. Varicocele:  None visualized. Pulsed Doppler interrogation of both testes demonstrates normal low resistance arterial and venous waveforms bilaterally. There is a  small cystic  area within the left scrotum adjacent to the left testicle with mural calcification. This area measures 1.8 x 1.4 x 1.3 cm. This appears to be external to the testicle. IMPRESSION: No visible testicular abnormality. Very large left hydrocele with internal echoes/debris. Small cystic structure adjacent to the left testicle which appears to be external to the testicle. Small mural calcifications/nodule. This is of unknown etiology but appears benign. This could be followed with repeat ultrasound in 3-6 months to ensure stability. Electronically Signed   By: Rolm Baptise M.D.   On: 12/18/2017 12:22   16 fr foley catheter placed after a betadine prep and urethral lubrication without difficulty.  Catheter placed to bedside bag drainage and secured to the right thigh.   Several hundred ml of clear urine returned.    Assessment: Urinary retention probably secondary to diuresis and underlying BPH.  Agree with tamsulosin.   If to be discharged soon, could f/u with [facility] in 1-2 weeks for a voiding trial.  If to remain as an inpatient for more than 2 days, a voiding trial could be considered in about 72 hours.   Agree with tamsulosin.     Testicular cyst.  F/U US in 3-6 months recommended to assess stability.    CC: Dr.  Dustin Flock MD     Miguel Torres 12/18/2017 [id number]

## 2017-12-18 NOTE — Progress Notes (Signed)
Patient has had NO overnight nursing assessment charted. Reason unknown. Will chart one for dayshift now. Miguel Torres [facility]

## 2017-12-18 NOTE — Plan of Care (Signed)
  Problem: Health Behavior/Discharge Planning: Goal: Ability to manage health-related needs will improve Outcome: Progressing Note:  Patient had a US Scrotal exam today. Patient also d/t see urologist this evening regarding needing a possible Foley or Coude catheter in order to relieve urinary retention. Will continue to monitor bladder status. Wenda Low [facility]

## 2017-12-18 NOTE — Clinical Social Work Note (Signed)
8/16-Patient has had bed offer from [facility] but would not be eligible to go until Sunday. If ready for discharge tomorrow, patient would need to pay out of pocket for short term rehab. Shela Leff MSW,LCSW [phone]

## 2017-12-18 NOTE — Progress Notes (Signed)
PT Cancellation Note  Patient Details Name: Miguel Torres MRN: [id number] DOB: 02/07/1931   Cancelled Treatment:     Order received. Chart reviewed. PT attempted session this morning however upon arrival pt complains of 10/10 groin pain and pts family states that pt is going for [place] soon to rule out kidney stones. PT plans to attempt session at later time when pt is more appropriate.  Tahisha Hakim, SPT   Katherinne Mofield 12/18/2017, 10:09 AM

## 2017-12-18 NOTE — Care Management (Signed)
Patient has been worked up for SNF, should he have a qualifying 3 night stay.   Patient has now been weaned to RA, but exertional stats on RA need to be checked.   Per [facility] rep patient declined PT services in the home. After discharge.   Patient was seen in the home and their documentation is as follows "pt with noted SOB upon arrival, O2 sats 83%, he does not have O2 in the home. Sats up to 92% at rest. Home environment unsafe and unsanitary for patient to heal properly with floor noted to have torn linoleum and crack in the wood subfloor with roaches crawling all over his chair, floors, supplies, and food. Patient diabetic with coke, and oatmeal cookies on table. Meds in bag from SNF patient has not been taking. Lasix in bag, patient has not been taking and noted edema to scrotum, hands, L leg and foot"   If patient return home he will need to have SW added to his home health order.  Dressing changes for her lower extremities also need to be included.

## 2017-12-19 LAB — BASIC METABOLIC PANEL
ANION GAP: 8 (ref 5–15)
BUN: 66 mg/dL — ABNORMAL HIGH (ref 8–23)
CALCIUM: 8.4 mg/dL — AB (ref 8.9–10.3)
CO2: 29 mmol/L (ref 22–32)
Chloride: 97 mmol/L — ABNORMAL LOW (ref 98–111)
Creatinine, Ser: 1.8 mg/dL — ABNORMAL HIGH (ref 0.61–1.24)
GFR, EST AFRICAN AMERICAN: 38 mL/min — AB (ref >60)
GFR, EST NON AFRICAN AMERICAN: 33 mL/min — AB (ref >60)
Glucose, Bld: 107 mg/dL — ABNORMAL HIGH (ref 70–99)
POTASSIUM: 4.1 mmol/L (ref 3.5–5.1)
Sodium: 134 mmol/L — ABNORMAL LOW (ref 135–145)

## 2017-12-19 LAB — GLUCOSE, CAPILLARY
GLUCOSE-CAPILLARY: 131 mg/dL — AB (ref 70–99)
GLUCOSE-CAPILLARY: 147 mg/dL — AB (ref 70–99)
GLUCOSE-CAPILLARY: 207 mg/dL — AB (ref 70–99)
Glucose-Capillary: 90 mg/dL (ref 70–99)

## 2017-12-19 NOTE — Progress Notes (Signed)
Miguel Torres at [facility]                                                                                                                                                                                  Patient Demographics   Miguel Torres, is a 84 y.o. male, DOB - 07-18-1930, YDX:[id number]  Admit date - 12/16/2017   Admitting Physician Vaughan Basta, MD  Outpatient Primary MD for the patient is [name], MD   LOS - 2  Subjective: foleyCatheter inserted by urologist yesterday, no draining clear urine, he is feeling much better now and had a good sleep last night.  Review of Systems:   CONSTITUTIONAL: No documented fever. No fatigue, weakness. No weight gain, no weight loss.  EYES: No blurry or double vision.  ENT: No tinnitus. No postnasal drip. No redness of the oropharynx.  RESPIRATORY: No cough, no wheeze, no hemoptysis.  Positive dyspnea.  CARDIOVASCULAR: No chest pain. No orthopnea. No palpitations. No syncope.  GASTROINTESTINAL: No nausea, no vomiting or diarrhea. No abdominal pain. No melena or hematochezia.  GENITOURINARY: No dysuria or hematuria.  Positive scrotal pain ENDOCRINE: No polyuria or nocturia. No heat or cold intolerance.  HEMATOLOGY: No anemia. No bruising. No bleeding.  INTEGUMENTARY: No rashes. No lesions.  MUSCULOSKELETAL: No arthritis. No swelling. No gout.  NEUROLOGIC: No numbness, tingling, or ataxia. No seizure-type activity.  PSYCHIATRIC: No anxiety. No insomnia. No ADD.    Vitals:   Vitals:   12/18/17 1654 12/18/17 2049 12/19/17 0402 12/19/17 0828  BP: (!) 156/81 122/76 134/71 (!) 118/57  Pulse: 73 68 85 84  Resp: 18 18 18 18   Temp: 98.1 F (36.7 C) 98 F (36.7 C) 97.8 F (36.6 C) 98.6 F (37 C)  TempSrc: Oral Oral Oral Oral  SpO2: 100% 95% 95% 92%  Weight:   103.2 kg   Height:        Wt Readings from Last 3 Encounters:  12/19/17 103.2 kg  12/09/17 104 kg  11/23/17 106.6 kg      Intake/Output Summary (Last 24 hours) at 12/19/2017 1301 Last data filed at 12/19/2017 1017 Gross per 24 hour  Intake 240 ml  Output 1700 ml  Net -1460 ml    Physical Exam:   GENERAL: Pleasant-appearing in no apparent distress.  HEAD, EYES, EARS, NOSE AND THROAT: Atraumatic, normocephalic. Extraocular muscles are intact. Pupils equal and reactive to light. Sclerae anicteric. No conjunctival injection. No oro-pharyngeal erythema.  NECK: Supple. There is no jugular venous distention. No bruits, no lymphadenopathy, no thyromegaly.  HEART: Regular rate and rhythm,. No murmurs, no rubs, no clicks.  LUNGS: Crackles at the bases no accessory muscle  usage or  aBDOMEN: Soft, flat, nontender, nondistended. Has good bowel sounds. No hepatosplenomegaly appreciated.  EXTREMITIES: No evidence of any cyanosis, clubbing, or positive peripheral edema.  Patient has right BKA with artificial prosthesis, left leg has Una wrap.  NEUROLOGIC: The patient is alert, awake, and oriented x3 with no focal motor or sensory deficits appreciated bilaterally.  SKIN: Moist and warm with no rashes appreciated.  Psych: Not anxious, depressed LN: No inguinal LN enlargement + Positive scrotal swelling asymmetrical   Antibiotics   Anti-infectives (From admission, onward)   Start     Dose/Rate Route Frequency Ordered Stop   12/18/17 1100  doxycycline (VIBRAMYCIN) 100 mg in sodium chloride 0.9 % 250 mL IVPB     100 mg 125 mL/hr over 120 Minutes Intravenous Every 12 hours 12/18/17 1001        Medications   Scheduled Meds: . apixaban  2.5 mg Oral BID  . feeding supplement (ENSURE ENLIVE)  237 mL Oral BID BM  . felodipine  5 mg Oral Daily  . ferrous sulfate  325 mg Oral Daily  . insulin aspart  0-9 Units Subcutaneous TID WC  . montelukast  5 mg Oral QHS  . multivitamin with minerals  1 tablet Oral Daily  . nutrition supplement (JUVEN)  1 packet Oral BID BM  . pantoprazole  40 mg Oral Daily  . pravastatin  40  mg Oral Daily  . tamsulosin  0.4 mg Oral Daily  . ascorbic acid  250 mg Oral Daily  . zinc sulfate  220 mg Oral Daily   Continuous Infusions: . doxycycline (VIBRAMYCIN) IV 100 mg (12/19/17 1102)   PRN Meds:.acetaminophen, docusate sodium, HYDROcodone-acetaminophen, polyethylene glycol   Data Review:   Micro Results No results found for this or any previous visit (from the past 240 hour(s)).  Radiology Reports Dg Chest 2 View  Result Date: 12/17/2017 CLINICAL DATA:  CHF. EXAM: CHEST - 2 VIEW COMPARISON:  12/16/2017. FINDINGS: Cardiomegaly with bilateral increased interstitial prominence. CHF could present this fashion. Pneumonitis can't be excluded. Bilateral pleural effusions are noted. No pneumothorax. IMPRESSION: Cardiomegaly with bilateral increase in interstitial prominence and bilateral pleural effusion. Findings most consistent CHF. Electronically Signed   By: Marcello Moores  Register   On: 12/17/2017 07:28   Dg Chest 2 View  Result Date: 12/16/2017 CLINICAL DATA:  Shortness of breath for 1 week EXAM: CHEST - 2 VIEW COMPARISON:  12/07/2017 FINDINGS: Bilateral diffuse interstitial thickening. No focal consolidation, pleural effusion or pneumothorax. Stable cardiomegaly. No acute osseous abnormality. IMPRESSION: Cardiomegaly with mild pulmonary vascular congestion. Electronically Signed   By: Kathreen Devoid   On: 12/16/2017 13:50   Dg Chest 2 View  Result Date: 12/07/2017 CLINICAL DATA:  Weakness.  Hypertension. EXAM: CHEST - 2 VIEW COMPARISON:  November 01, 2017 FINDINGS: There is an equivocal left pleural effusion. There is no edema or consolidation. Heart is upper normal in size with pulmonary vascularity normal. No adenopathy. Bones are osteoporotic. There is degenerative change in the thoracic spine. There is aortic atherosclerosis. IMPRESSION: Equivocal left pleural effusion. No edema or consolidation. Stable cardiac silhouette. There is aortic atherosclerosis. Bones osteoporotic. Aortic  Atherosclerosis (ICD10-I70.0). Electronically Signed   By: Lowella Grip III M.D.   On: 12/07/2017 11:18   US Pelvis Limited (transabdominal Only)  Result Date: 12/18/2017 CLINICAL DATA:  Urinary retention. EXAM: LIMITED ULTRASOUND OF PELVIS TECHNIQUE: Limited transabdominal ultrasound examination of the pelvis was performed. COMPARISON:  No recent prior pelvic ultrasound. FINDINGS: The urinary bladder has a normal  appearance for the degree of distention. Pre-void volume:  575 ml Post-void volume: Patient could not void. Other findings:  None. IMPRESSION: Prevoid volume of 575 mL.  Patient could not void. Electronically Signed   By: Marcello Moores  Register   On: 12/18/2017 12:57   US Scrotum W/doppler  Result Date: 12/18/2017 CLINICAL DATA:  Swelling EXAM: SCROTAL ULTRASOUND DOPPLER ULTRASOUND OF THE TESTICLES TECHNIQUE: Complete ultrasound examination of the testicles, epididymis, and other scrotal structures was performed. Color and spectral Doppler ultrasound were also utilized to evaluate blood flow to the testicles. COMPARISON:  None. FINDINGS: Right testicle Measurements: 4.3 x 2.2 x 3.6 cm. No mass or microlithiasis visualized. Left testicle Measurements: 4.9 x 2.4 x 2.9 cm. No mass or microlithiasis visualized. Right epididymis:  Small complex cyst measures 5 mm. Left epididymis:  Not well visualized Hydrocele: Large left hydrocele with internal echoes. This compresses the left testicle along the scrotal wall. Varicocele:  None visualized. Pulsed Doppler interrogation of both testes demonstrates normal low resistance arterial and venous waveforms bilaterally. There is a small cystic area within the left scrotum adjacent to the left testicle with mural calcification. This area measures 1.8 x 1.4 x 1.3 cm. This appears to be external to the testicle. IMPRESSION: No visible testicular abnormality. Very large left hydrocele with internal echoes/debris. Small cystic structure adjacent to the left testicle  which appears to be external to the testicle. Small mural calcifications/nodule. This is of unknown etiology but appears benign. This could be followed with repeat ultrasound in 3-6 months to ensure stability. Electronically Signed   By: Rolm Baptise M.D.   On: 12/18/2017 12:22     CBC Recent Labs  Lab 12/16/17 1317 12/17/17 0608  WBC 6.4 8.7  HGB 10.1* 10.5*  HCT 30.0* 30.2*  PLT 169 160  MCV 94.1 92.1  MCH 31.8 31.9  MCHC 33.8 34.7  RDW 15.9* 15.6*    Chemistries  Recent Labs  Lab 12/16/17 1317 12/17/17 0524 12/18/17 0546 12/19/17 0501  NA 139 138 136 134*  K 4.6 4.5 4.2 4.1  CL 105 104 97* 97*  CO2 25 28 29 29   GLUCOSE 168* 105* 101* 107*  BUN 41* 40* 52* 66*  CREATININE 1.45* 1.39* 1.74* 1.80*  CALCIUM 8.5* 8.5* 8.6* 8.4*   ------------------------------------------------------------------------------------------------------------------ estimated creatinine clearance is 34.9 mL/min (A) (by C-G formula based on SCr of 1.8 mg/dL (H)). ------------------------------------------------------------------------------------------------------------------ No results for input(s): HGBA1C in the last 72 hours. ------------------------------------------------------------------------------------------------------------------ No results for input(s): CHOL, HDL, LDLCALC, TRIG, CHOLHDL, LDLDIRECT in the last 72 hours. ------------------------------------------------------------------------------------------------------------------ No results for input(s): TSH, T4TOTAL, T3FREE, THYROIDAB in the last 72 hours.  Invalid input(s): FREET3 ------------------------------------------------------------------------------------------------------------------ No results for input(s): VITAMINB12, FOLATE, FERRITIN, TIBC, IRON, RETICCTPCT in the last 72 hours.  Coagulation profile No results for input(s): INR, PROTIME in the last 168 hours.  No results for input(s): DDIMER in the last 72  hours.  Cardiac Enzymes Recent Labs  Lab 12/16/17 1317 12/16/17 1534  TROPONINI 0.06* 0.06*   ------------------------------------------------------------------------------------------------------------------ Invalid input(s): POCBNP    Assessment & Plan   *Acute on chronic diastolic congestive heart failure Discontinue Lasix due to creatinine increasing Shortness of breath much improved than before.  *Acute on chronic renal failure stage III Renal function worse we checked a renal ultrasound which shows urinary retention.  *Acute urinary retention; likely due to BPH.  Started on Flomax, Foley catheter inserted by urologist yesterday..  Patient is feeling much better. *Possible epididymitis;on empiric doxycycline *Atrial flutter On beta-blockers and Eliquis. Heart  rate is stable.    Appreciate cardiology input  *Hyperlipidemia Continue statin.  *Diabetes He is on oral medications, blood sugar is controlled, keep on sliding scale coverage.  *CODE STATUS DNR is not interested in hospice following him at the home Family is interested in placement, likely discharge to [facility] on Monday.     Code Status Orders  (From admission, onward)         Start     Ordered   12/16/17 1746  Do not attempt resuscitation (DNR)  Continuous    Question Answer Comment  In the event of cardiac or respiratory ARREST Do not call a "code blue"   In the event of cardiac or respiratory ARREST Do not perform Intubation, CPR, defibrillation or ACLS   In the event of cardiac or respiratory ARREST Use medication by any route, position, wound care, and other measures to relive pain and suffering. May use oxygen, suction and manual treatment of airway obstruction as needed for comfort.      12/16/17 1745        Code Status History    Date Active Date Inactive Code Status Order ID Comments User Context   12/16/2017 1647 12/16/2017 1745 DNR [id number]  Vaughan Basta, MD ED    12/07/2017 1424 12/09/2017 1613 Full Code [id number]  Salary, Avel Peace, MD Inpatient   11/01/2017 1358 11/04/2017 1719 Full Code [id number]  Gladstone Lighter, MD Inpatient    Advance Directive Documentation     Most Recent Value  Type of Advance Directive  Healthcare [employer] of [employer], Living will  Pre-existing out of facility DNR order (yellow form or pink MOST form)  -  "MOST" Form in Place?  -           Consults cardiology  DVT Prophylaxis Eliquis  Lab Results  Component Value Date   PLT 160 12/17/2017     Time Spent in minutes 35 minutes  Greater than 50% of time spent in care coordination and counseling patient regarding the condition and plan of care.   Epifanio Lesches M.D on 12/19/2017 at 1:01 PM  Between 7am to 6pm - Pager - [phone]  After 6pm go to [email or web address] - Proofreader  [employer]   Office  [phone]

## 2017-12-20 LAB — GLUCOSE, CAPILLARY
GLUCOSE-CAPILLARY: 225 mg/dL — AB (ref 70–99)
Glucose-Capillary: 257 mg/dL — ABNORMAL HIGH (ref 70–99)
Glucose-Capillary: 230 mg/dL — ABNORMAL HIGH (ref 70–99)

## 2017-12-20 NOTE — Plan of Care (Signed)
  Problem: Health Behavior/Discharge Planning: Goal: Ability to manage health-related needs will improve Outcome: Progressing Note:  Patient agreeable to head wound dressing change this afternoon to promote healing of pressure injury to the area. Will continue to monitor wound dressing status, and proceed to change L.L.E. wound dressing at this time, as ordered. Wenda Low [facility]

## 2017-12-20 NOTE — Progress Notes (Signed)
[place] at [facility]                                                                                                                                                                                  Patient Demographics   Miguel Torres, is a 84 y.o. male, DOB - 05-30-1930, TKW:[id number]  Admit date - 12/16/2017   Admitting Physician Vaughan Basta, MD  Outpatient Primary MD for the patient is [name], [name], MD   LOS - 3  Subjective: Patient asking for dressing changes for the left leg.  No shortness of breath.  No chest pain.  Slept well last night.  Daughter is at bedside.  Agreeable for discharge to [facility]  Review of Systems:   CONSTITUTIONAL: No documented fever. No fatigue, weakness. No weight gain, no weight loss.  EYES: No blurry or double vision.  ENT: No tinnitus. No postnasal drip. No redness of the oropharynx.  RESPIRATORY: No cough, no wheeze, no hemoptysis.  Positive dyspnea.  CARDIOVASCULAR: No chest pain. No orthopnea. No palpitations. No syncope.  GASTROINTESTINAL: No nausea, no vomiting or diarrhea. No abdominal pain. No melena or hematochezia.  GENITOURINARY: No dysuria or hematuria.  Positive scrotal pain ENDOCRINE: No polyuria or nocturia. No heat or cold intolerance.  HEMATOLOGY: No anemia. No bruising. No bleeding.  INTEGUMENTARY: No rashes. No lesions.  MUSCULOSKELETAL: No arthritis. No swelling.  Patient has left leg dressing present, some foul odor is coming.[name]  NEUROLOGIC: No numbness, tingling, or ataxia. No seizure-type activity.  PSYCHIATRIC: No anxiety. No insomnia. No ADD.    Vitals:   Vitals:   12/19/17 1437 12/19/17 1917 12/20/17 0438 12/20/17 0800  BP: (!) 135/55 136/60 134/67 (!) 149/77  Pulse: 83 80 72 94  Resp:  18  16  Temp: 98.1 F (36.7 C) 98.2 F (36.8 C) 98.5 F (36.9 C) 98.7 F (37.1 C)  TempSrc: Oral Oral Oral Oral  SpO2: 94% 94% 94% 94%  Weight:   104.5 kg   Height:         Wt Readings from Last 3 Encounters:  12/20/17 104.5 kg  12/09/17 104 kg  11/23/17 106.6 kg     Intake/Output Summary (Last 24 hours) at 12/20/2017 1056 Last data filed at 12/20/2017 1019 Gross per 24 hour  Intake 1737.92 ml  Output 2851 ml  Net -1113.08 ml    Physical Exam:   GENERAL: Pleasant-appearing in no apparent distress.  HEAD, EYES, EARS, NOSE AND THROAT: Atraumatic, normocephalic. Extraocular muscles are intact. Pupils equal and reactive to light. Sclerae anicteric. No conjunctival injection. No oro-pharyngeal erythema.  NECK: Supple. There is no jugular venous distention. No bruits, no  lymphadenopathy, no thyromegaly.  HEART: Regular rate and rhythm,. No murmurs, no rubs, no clicks.  LUNGS: Crackles at the bases no accessory muscle usage or  aBDOMEN: Soft, flat, nontender, nondistended. Has good bowel sounds. No hepatosplenomegaly appreciated.  EXTREMITIES: No evidence of any cyanosis, clubbing, or positive peripheral edema.  Patient has right BKA with artificial prosthesis, left leg has Una wrap.  NEUROLOGIC: The patient is alert, awake, and oriented x3 with no focal motor or sensory deficits appreciated bilaterally.  SKIN: Moist and warm with no rashes appreciated.  Psych: Not anxious, depressed LN: No inguinal LN enlargement + Positive scrotal swelling asymmetrical   Antibiotics   Anti-infectives (From admission, onward)   Start     Dose/Rate Route Frequency Ordered Stop   12/18/17 1100  doxycycline (VIBRAMYCIN) 100 mg in sodium chloride 0.9 % 250 mL IVPB     100 mg 125 mL/hr over 120 Minutes Intravenous Every 12 hours 12/18/17 1001        Medications   Scheduled Meds: . apixaban  2.5 mg Oral BID  . feeding supplement (ENSURE ENLIVE)  237 mL Oral BID BM  . felodipine  5 mg Oral Daily  . ferrous sulfate  325 mg Oral Daily  . insulin aspart  0-9 Units Subcutaneous TID WC  . montelukast  5 mg Oral QHS  . multivitamin with minerals  1 tablet Oral Daily   . nutrition supplement (JUVEN)  1 packet Oral BID BM  . pantoprazole  40 mg Oral Daily  . pravastatin  40 mg Oral Daily  . tamsulosin  0.4 mg Oral Daily  . ascorbic acid  250 mg Oral Daily  . zinc sulfate  220 mg Oral Daily   Continuous Infusions: . doxycycline (VIBRAMYCIN) IV Stopped (12/20/17 0013)   PRN Meds:.acetaminophen, docusate sodium, HYDROcodone-acetaminophen, polyethylene glycol   Data Review:   Micro Results No results found for this or any previous visit (from the past 240 hour(s)).  Radiology Reports Dg Chest 2 View  Result Date: 12/17/2017 CLINICAL DATA:  CHF. EXAM: CHEST - 2 VIEW COMPARISON:  12/16/2017. FINDINGS: Cardiomegaly with bilateral increased interstitial prominence. CHF could present this fashion. Pneumonitis can't be excluded. Bilateral pleural effusions are noted. No pneumothorax. IMPRESSION: Cardiomegaly with bilateral increase in interstitial prominence and bilateral pleural effusion. Findings most consistent CHF. Electronically Signed   By: Marcello Moores  Register   On: 12/17/2017 07:28   Dg Chest 2 View  Result Date: 12/16/2017 CLINICAL DATA:  Shortness of breath for 1 week EXAM: CHEST - 2 VIEW COMPARISON:  12/07/2017 FINDINGS: Bilateral diffuse interstitial thickening. No focal consolidation, pleural effusion or pneumothorax. Stable cardiomegaly. No acute osseous abnormality. IMPRESSION: Cardiomegaly with mild pulmonary vascular congestion. Electronically Signed   By: Kathreen Devoid   On: 12/16/2017 13:50   Dg Chest 2 View  Result Date: 12/07/2017 CLINICAL DATA:  Weakness.  Hypertension. EXAM: CHEST - 2 VIEW COMPARISON:  November 01, 2017 FINDINGS: There is an equivocal left pleural effusion. There is no edema or consolidation. Heart is upper normal in size with pulmonary vascularity normal. No adenopathy. Bones are osteoporotic. There is degenerative change in the thoracic spine. There is aortic atherosclerosis. IMPRESSION: Equivocal left pleural effusion. No edema  or consolidation. Stable cardiac silhouette. There is aortic atherosclerosis. Bones osteoporotic. Aortic Atherosclerosis (ICD10-I70.0). Electronically Signed   By: Lowella Grip III M.D.   On: 12/07/2017 11:18   US Pelvis Limited (transabdominal Only)  Result Date: 12/18/2017 CLINICAL DATA:  Urinary retention. EXAM: LIMITED ULTRASOUND OF PELVIS TECHNIQUE:  Limited transabdominal ultrasound examination of the pelvis was performed. COMPARISON:  No recent prior pelvic ultrasound. FINDINGS: The urinary bladder has a normal appearance for the degree of distention. Pre-void volume:  575 ml Post-void volume: Patient could not void. Other findings:  None. IMPRESSION: Prevoid volume of 575 mL.  Patient could not void. Electronically Signed   By: Marcello Moores  Register   On: 12/18/2017 12:57   US Scrotum W/doppler  Result Date: 12/18/2017 CLINICAL DATA:  Swelling EXAM: SCROTAL ULTRASOUND DOPPLER ULTRASOUND OF THE TESTICLES TECHNIQUE: Complete ultrasound examination of the testicles, epididymis, and other scrotal structures was performed. Color and spectral Doppler ultrasound were also utilized to evaluate blood flow to the testicles. COMPARISON:  None. FINDINGS: Right testicle Measurements: 4.3 x 2.2 x 3.6 cm. No mass or microlithiasis visualized. Left testicle Measurements: 4.9 x 2.4 x 2.9 cm. No mass or microlithiasis visualized. Right epididymis:  Small complex cyst measures 5 mm. Left epididymis:  Not well visualized Hydrocele: Large left hydrocele with internal echoes. This compresses the left testicle along the scrotal wall. Varicocele:  None visualized. Pulsed Doppler interrogation of both testes demonstrates normal low resistance arterial and venous waveforms bilaterally. There is a small cystic area within the left scrotum adjacent to the left testicle with mural calcification. This area measures 1.8 x 1.4 x 1.3 cm. This appears to be external to the testicle. IMPRESSION: No visible testicular abnormality. Very  large left hydrocele with internal echoes/debris. Small cystic structure adjacent to the left testicle which appears to be external to the testicle. Small mural calcifications/nodule. This is of unknown etiology but appears benign. This could be followed with repeat ultrasound in 3-6 months to ensure stability. Electronically Signed   By: Rolm Baptise M.D.   On: 12/18/2017 12:22     CBC Recent Labs  Lab 12/16/17 1317 12/17/17 0608  WBC 6.4 8.7  HGB 10.1* 10.5*  HCT 30.0* 30.2*  PLT 169 160  MCV 94.1 92.1  MCH 31.8 31.9  MCHC 33.8 34.7  RDW 15.9* 15.6*    Chemistries  Recent Labs  Lab 12/16/17 1317 12/17/17 0524 12/18/17 0546 12/19/17 0501  NA 139 138 136 134*  K 4.6 4.5 4.2 4.1  CL 105 104 97* 97*  CO2 25 28 29 29   GLUCOSE 168* 105* 101* 107*  BUN 41* 40* 52* 66*  CREATININE 1.45* 1.39* 1.74* 1.80*  CALCIUM 8.5* 8.5* 8.6* 8.4*   ------------------------------------------------------------------------------------------------------------------ estimated creatinine clearance is 35.1 mL/min (A) (by C-G formula based on SCr of 1.8 mg/dL (H)). ------------------------------------------------------------------------------------------------------------------ No results for input(s): HGBA1C in the last 72 hours. ------------------------------------------------------------------------------------------------------------------ No results for input(s): CHOL, HDL, LDLCALC, TRIG, CHOLHDL, LDLDIRECT in the last 72 hours. ------------------------------------------------------------------------------------------------------------------ No results for input(s): TSH, T4TOTAL, T3FREE, THYROIDAB in the last 72 hours.  Invalid input(s): FREET3 ------------------------------------------------------------------------------------------------------------------ No results for input(s): VITAMINB12, FOLATE, FERRITIN, TIBC, IRON, RETICCTPCT in the last 72 hours.  Coagulation profile No results for  input(s): INR, PROTIME in the last 168 hours.  No results for input(s): DDIMER in the last 72 hours.  Cardiac Enzymes Recent Labs  Lab 12/16/17 1317 12/16/17 1534  TROPONINI 0.06* 0.06*   ------------------------------------------------------------------------------------------------------------------ Invalid input(s): POCBNP    Assessment & Plan   *Acute on chronic diastolic congestive heart failure Discontinue Lasix due to cr worsening renal failure, patient has no shortness of breath, renal function is stable.  Continue to hold the Lasix.  Shortness of breath much improved than before.  No hypoxia. Resume metoazonel at discharge that he was taking before *Acute  on chronic renal failure stage III Renal function worse we checked a renal ultrasound which shows urinary retention.  Baseline creatinine is around 1.7.   *Acute urinary retention; likely due to BPH.  Started on Flomax, Foley catheter inserted by urologist.  Patient is feeling much better.  Draining well with clear yellow urine.  *Possible epididymitis;on empiric doxycycline, no fever.  *Atrial flutter On beta-blockers and Eliquis. Heart rate is stable.    Appreciate cardiology input  *Hyperlipidemia Continue statin.  *Diabetes mellitus type 2: Resume glipizide from today, continue sliding scale insulin with coverage.  Hold metformin because of renal failure.  Actos is on hold because of heart failure diagnosis.  Left foot ulcer due to diabetes mellitus type 2, followed by vascular, patient has history of slow healing ulcer on the left foot lateral aspect secondary to underlying diabetes, possible PAD patient needs ABI in the future as per vascular note from July 22.  And today I feel like there is a foul odor coming from the dressing so told the nurse to change the dressings, we will consult the wound care.  Patient has right below-knee amputation and has artificial prosthesis.  Will send wound cultures from left  leg ulcer.  *CODE STATUS DNR is not interested in hospice following him at the home Family is interested in placement, likely discharge to [facility] on Monday. spokeWith patient's daughter today    Code Status Orders  (From admission, onward)         Start     Ordered   12/16/17 1746  Do not attempt resuscitation (DNR)  Continuous    Question Answer Comment  In the event of cardiac or respiratory ARREST Do not call a "code blue"   In the event of cardiac or respiratory ARREST Do not perform Intubation, CPR, defibrillation or ACLS   In the event of cardiac or respiratory ARREST Use medication by any route, position, wound care, and other measures to relive pain and suffering. May use oxygen, suction and manual treatment of airway obstruction as needed for comfort.      12/16/17 1745        Code Status History    Date Active Date Inactive Code Status Order ID Comments User Context   12/16/2017 1647 12/16/2017 1745 DNR [id number]  Vaughan Basta, MD ED   12/07/2017 1424 12/09/2017 1613 Full Code [id number]  Salary, Avel Peace, MD Inpatient   11/01/2017 1358 11/04/2017 1719 Full Code [id number]  Gladstone Lighter, MD Inpatient    Advance Directive Documentation     Most Recent Value  Type of Advance Directive  Healthcare Power of [place], Living will  Pre-existing out of facility DNR order (yellow form or pink MOST form)  -  "MOST" Form in Place?  -           Consults cardiology  DVT Prophylaxis Eliquis  Lab Results  Component Value Date   PLT 160 12/17/2017     Time Spent in minutes 35 minutes  Greater than 50% of time spent in care coordination and counseling patient regarding the condition and plan of care.   Epifanio Lesches M.D on 12/20/2017 at 10:56 AM  Between 7am to 6pm - Pager - [phone]  After 6pm go to [email or web address] - Proofreader  [employer]   Office  [phone]

## 2017-12-20 NOTE — Progress Notes (Signed)
Attempted to change patient's L.L.E. wound dressing and culture the site, however, patient has a dressing over area that isn't stocked and I do not feel comfortable removing it and having nothing to put back over it. So, won't remove this dressing or culture the site at this time. Will leave that up to the [facility] RN tomorrow. Additionally, found another bloody area on anterior shin upon removing [employer]. Covered that area with Kerlix and taped. Didn't apply ACE wrap or Coban to L.L.E. d/t unexpected dressing on shin area and having not removed the old dressing from the lateral foot. Left collection swab, label, and ACE wrap at bedside. Will pass along in shift report. Asked NT for new bedding sheet and blanket as the old ones were soiled with blood. Will continue to monitor existing dressings. Wenda Low [facility]

## 2017-12-20 NOTE — Progress Notes (Signed)
Patient's wound dressing changed at this time, as ordered. Patient tolerated well. Foam pad replaced. Will continue to monitor wound dressing status for remainder of shift. Wenda Low [facility]

## 2017-12-21 ENCOUNTER — Telehealth: Payer: Self-pay | Admitting: Urology

## 2017-12-21 ENCOUNTER — Ambulatory Visit: Payer: Medicare Other | Admitting: Physician Assistant

## 2017-12-21 ENCOUNTER — Encounter

## 2017-12-21 DIAGNOSIS — M1990 Unspecified osteoarthritis, unspecified site: Secondary | ICD-10-CM | POA: Diagnosis not present

## 2017-12-21 DIAGNOSIS — L89892 Pressure ulcer of other site, stage 2: Secondary | ICD-10-CM | POA: Diagnosis not present

## 2017-12-21 DIAGNOSIS — N179 Acute kidney failure, unspecified: Secondary | ICD-10-CM | POA: Diagnosis not present

## 2017-12-21 DIAGNOSIS — R0989 Other specified symptoms and signs involving the circulatory and respiratory systems: Secondary | ICD-10-CM | POA: Diagnosis not present

## 2017-12-21 DIAGNOSIS — E559 Vitamin D deficiency, unspecified: Secondary | ICD-10-CM | POA: Diagnosis not present

## 2017-12-21 DIAGNOSIS — Z89511 Acquired absence of right leg below knee: Secondary | ICD-10-CM | POA: Diagnosis not present

## 2017-12-21 DIAGNOSIS — H409 Unspecified glaucoma: Secondary | ICD-10-CM | POA: Diagnosis not present

## 2017-12-21 DIAGNOSIS — I509 Heart failure, unspecified: Secondary | ICD-10-CM | POA: Diagnosis not present

## 2017-12-21 DIAGNOSIS — I10 Essential (primary) hypertension: Secondary | ICD-10-CM | POA: Diagnosis not present

## 2017-12-21 DIAGNOSIS — I890 Lymphedema, not elsewhere classified: Secondary | ICD-10-CM | POA: Diagnosis not present

## 2017-12-21 DIAGNOSIS — N319 Neuromuscular dysfunction of bladder, unspecified: Secondary | ICD-10-CM | POA: Diagnosis not present

## 2017-12-21 DIAGNOSIS — E1140 Type 2 diabetes mellitus with diabetic neuropathy, unspecified: Secondary | ICD-10-CM | POA: Diagnosis not present

## 2017-12-21 DIAGNOSIS — E11622 Type 2 diabetes mellitus with other skin ulcer: Secondary | ICD-10-CM | POA: Diagnosis not present

## 2017-12-21 DIAGNOSIS — R339 Retention of urine, unspecified: Secondary | ICD-10-CM | POA: Diagnosis not present

## 2017-12-21 DIAGNOSIS — I5033 Acute on chronic diastolic (congestive) heart failure: Secondary | ICD-10-CM | POA: Diagnosis not present

## 2017-12-21 DIAGNOSIS — K219 Gastro-esophageal reflux disease without esophagitis: Secondary | ICD-10-CM | POA: Diagnosis not present

## 2017-12-21 DIAGNOSIS — Z7984 Long term (current) use of oral hypoglycemic drugs: Secondary | ICD-10-CM | POA: Diagnosis not present

## 2017-12-21 DIAGNOSIS — N138 Other obstructive and reflux uropathy: Secondary | ICD-10-CM | POA: Diagnosis not present

## 2017-12-21 DIAGNOSIS — Z7401 Bed confinement status: Secondary | ICD-10-CM | POA: Diagnosis not present

## 2017-12-21 DIAGNOSIS — M25511 Pain in right shoulder: Secondary | ICD-10-CM | POA: Diagnosis not present

## 2017-12-21 DIAGNOSIS — I482 Chronic atrial fibrillation: Secondary | ICD-10-CM | POA: Diagnosis not present

## 2017-12-21 DIAGNOSIS — L97522 Non-pressure chronic ulcer of other part of left foot with fat layer exposed: Secondary | ICD-10-CM | POA: Diagnosis not present

## 2017-12-21 DIAGNOSIS — N401 Enlarged prostate with lower urinary tract symptoms: Secondary | ICD-10-CM | POA: Diagnosis not present

## 2017-12-21 DIAGNOSIS — Z8249 Family history of ischemic heart disease and other diseases of the circulatory system: Secondary | ICD-10-CM | POA: Diagnosis not present

## 2017-12-21 DIAGNOSIS — R4182 Altered mental status, unspecified: Secondary | ICD-10-CM | POA: Diagnosis not present

## 2017-12-21 DIAGNOSIS — I110 Hypertensive heart disease with heart failure: Secondary | ICD-10-CM | POA: Diagnosis not present

## 2017-12-21 DIAGNOSIS — Z85828 Personal history of other malignant neoplasm of skin: Secondary | ICD-10-CM | POA: Diagnosis not present

## 2017-12-21 DIAGNOSIS — E119 Type 2 diabetes mellitus without complications: Secondary | ICD-10-CM | POA: Diagnosis not present

## 2017-12-21 DIAGNOSIS — E785 Hyperlipidemia, unspecified: Secondary | ICD-10-CM | POA: Diagnosis not present

## 2017-12-21 DIAGNOSIS — R262 Difficulty in walking, not elsewhere classified: Secondary | ICD-10-CM | POA: Diagnosis not present

## 2017-12-21 DIAGNOSIS — L97829 Non-pressure chronic ulcer of other part of left lower leg with unspecified severity: Secondary | ICD-10-CM | POA: Diagnosis not present

## 2017-12-21 DIAGNOSIS — Z7901 Long term (current) use of anticoagulants: Secondary | ICD-10-CM | POA: Diagnosis not present

## 2017-12-21 DIAGNOSIS — E11621 Type 2 diabetes mellitus with foot ulcer: Secondary | ICD-10-CM | POA: Diagnosis not present

## 2017-12-21 DIAGNOSIS — N183 Chronic kidney disease, stage 3 (moderate): Secondary | ICD-10-CM | POA: Diagnosis not present

## 2017-12-21 DIAGNOSIS — I739 Peripheral vascular disease, unspecified: Secondary | ICD-10-CM | POA: Diagnosis not present

## 2017-12-21 DIAGNOSIS — I5032 Chronic diastolic (congestive) heart failure: Secondary | ICD-10-CM | POA: Diagnosis not present

## 2017-12-21 DIAGNOSIS — R600 Localized edema: Secondary | ICD-10-CM | POA: Diagnosis not present

## 2017-12-21 DIAGNOSIS — D649 Anemia, unspecified: Secondary | ICD-10-CM | POA: Diagnosis not present

## 2017-12-21 DIAGNOSIS — M75121 Complete rotator cuff tear or rupture of right shoulder, not specified as traumatic: Secondary | ICD-10-CM | POA: Diagnosis not present

## 2017-12-21 DIAGNOSIS — I4892 Unspecified atrial flutter: Secondary | ICD-10-CM | POA: Diagnosis not present

## 2017-12-21 DIAGNOSIS — N451 Epididymitis: Secondary | ICD-10-CM | POA: Diagnosis not present

## 2017-12-21 DIAGNOSIS — L89894 Pressure ulcer of other site, stage 4: Secondary | ICD-10-CM | POA: Diagnosis not present

## 2017-12-21 DIAGNOSIS — E1122 Type 2 diabetes mellitus with diabetic chronic kidney disease: Secondary | ICD-10-CM | POA: Diagnosis not present

## 2017-12-21 DIAGNOSIS — L97526 Non-pressure chronic ulcer of other part of left foot with bone involvement without evidence of necrosis: Secondary | ICD-10-CM | POA: Diagnosis not present

## 2017-12-21 DIAGNOSIS — E1151 Type 2 diabetes mellitus with diabetic peripheral angiopathy without gangrene: Secondary | ICD-10-CM | POA: Diagnosis not present

## 2017-12-21 DIAGNOSIS — M6281 Muscle weakness (generalized): Secondary | ICD-10-CM | POA: Diagnosis not present

## 2017-12-21 LAB — BASIC METABOLIC PANEL
Anion gap: 11 (ref 5–15)
BUN: 80 mg/dL — AB (ref 8–23)
CO2: 25 mmol/L (ref 22–32)
CREATININE: 1.79 mg/dL — AB (ref 0.61–1.24)
Calcium: 8.5 mg/dL — ABNORMAL LOW (ref 8.9–10.3)
Chloride: 97 mmol/L — ABNORMAL LOW (ref 98–111)
GFR, EST AFRICAN AMERICAN: 38 mL/min — AB (ref >60)
GFR, EST NON AFRICAN AMERICAN: 33 mL/min — AB (ref >60)
Glucose, Bld: 180 mg/dL — ABNORMAL HIGH (ref 70–99)
Potassium: 4.1 mmol/L (ref 3.5–5.1)
SODIUM: 133 mmol/L — AB (ref 135–145)

## 2017-12-21 LAB — GLUCOSE, CAPILLARY
GLUCOSE-CAPILLARY: 171 mg/dL — AB (ref 70–99)
Glucose-Capillary: 137 mg/dL — ABNORMAL HIGH (ref 70–99)

## 2017-12-21 MED ORDER — TAMSULOSIN HCL 0.4 MG PO CAPS: 0.4000 mg | ORAL_CAPSULE | Freq: Every day | ORAL | 0 refills | Status: DC

## 2017-12-21 MED ORDER — INSULIN ASPART 100 UNIT/ML ~~LOC~~ SOLN: 0.0000 [IU] | Freq: Three times a day (TID) | SUBCUTANEOUS | 11 refills | Status: DC

## 2017-12-21 NOTE — Progress Notes (Signed)
Physical Therapy Treatment Patient Details Name: Miguel Torres MRN: [id number] DOB: Sep 29, 1930 Today's Date: 12/21/2017    History of Present Illness Patient is an 84 year old male admitted with CHF.  PMH includes R AKA, chronic ulceration of L foot, skin CA, Htn, HLD, DM and anemia.    PT Comments    Pt seen this morning and states that he is very eager to leave the hospital. Pt is agreeable to PT however states he prefers to not do more than just getting up to the chair. Pt appears much more deconditioned this visit than previously and requires max assist for bed mobility as well sit<>stand. Pt unable to maintain standing and needs to sit back down quickly after achieving upright posture. Pt requires several attempts sit<>stand from elevated surface before achieving. Pt instructed in B LE there-ex which he tolerates well. Pt still impulsive, was educated about prosthetic safety with donning and doffing. Pt could benefit from continued skilled therapy at this time to improve deficits toward PLOF. PT will continue to work with pt at least BID while admitted. D/c recommendations continue to be SNF.   Follow Up Recommendations  SNF     Equipment Recommendations  None recommended by PT    Recommendations for Other Services       Precautions / Restrictions Precautions Precautions: Fall Restrictions Weight Bearing Restrictions: No Other Position/Activity Restrictions: Patient has a walking boot for WB on L foot. R BKA prosthesis    Mobility  Bed Mobility Overal bed mobility: Needs Assistance Bed Mobility: Supine to Sit     Supine to sit: Max assist     General bed mobility comments: Heavy reliance on the rails, needed heavy assistance with trunk and lower extremities.  PT needed to help don R prosthetic, however pt did majority of the task donning prosthetic  Transfers Overall transfer level: Needs assistance Equipment used: Rolling walker (2 wheeled) Transfers: Sit to/from  Stand Sit to Stand: Max assist;From elevated surface         General transfer comment: Pt transfers sit<>stand from elevated surface with max assist. Once standing pt unable to maintain standing and quickly needs to sit back down. Attempted multiple times before acheiving standing for short period  Ambulation/Gait             General Gait Details: Unable to assess due to pts inability to maintain standing with safety   Stairs             Wheelchair Mobility    Modified Rankin (Stroke Patients Only)       Balance                                            Cognition Arousal/Alertness: Awake/alert Behavior During Therapy: Impulsive Overall Cognitive Status: Difficult to assess                                 General Comments: A&Ox4, follows commands consistently.      Exercises Other Exercises Other Exercises: Assisted in donning and doffing prosthesis and walking boot for contralateral leg, educated pt on safe donning of prosthesis as pt was ready to stand before prosthesis was locked into place Other Exercises: Pt instructed in and tolerates well B LE ther-ex including SLR, hip abd, and seated marching x10 reps  General Comments        Pertinent Vitals/Pain Pain Assessment: 0-10 Pain Score: 0-No pain(0/10 at rest, 10/10 low back pain with activity) Pain Location: Low back pain Pain Descriptors / Indicators: Aching;Sharp Pain Intervention(s): Monitored during session;Repositioned    Home Living                      Prior Function            PT Goals (current goals can now be found in the care plan section) Acute Rehab PT Goals Patient Stated Goal: To return to independent lifestyle. PT Goal Formulation: With patient Time For Goal Achievement: 01/01/18 Potential to Achieve Goals: Fair Progress towards PT goals: Progressing toward goals    Frequency    Min 2X/week      PT Plan Current plan  remains appropriate    Co-evaluation              AM-PAC PT "6 Clicks" Daily Activity  Outcome Measure  Difficulty turning over in bed (including adjusting bedclothes, sheets and blankets)?: Unable Difficulty moving from lying on back to sitting on the side of the bed? : Unable Difficulty sitting down on and standing up from a chair with arms (e.g., wheelchair, bedside commode, etc,.)?: Unable Help needed moving to and from a bed to chair (including a wheelchair)?: Total Help needed walking in hospital room?: Total Help needed climbing 3-5 steps with a railing? : Total 6 Click Score: 6    End of Session Equipment Utilized During Treatment: Gait belt Activity Tolerance: Patient limited by fatigue Patient left: in bed;with call bell/phone within reach;with bed alarm set Nurse Communication: Mobility status PT Visit Diagnosis: Unsteadiness on feet (R26.81);Muscle weakness (generalized) (M62.81);Difficulty in walking, not elsewhere classified (R26.2)     Time: 4098-1191 PT Time Calculation (min) (ACUTE ONLY): 27 min  Charges:                        Yolonda Kida, SPT    Marilynne Dupuis 12/21/2017, 11:56 AM

## 2017-12-21 NOTE — Progress Notes (Signed)
Patient alert and oriented, vss.  Wound care done today.  Called [facility] to give report.  To be escorted out of hospital via EMS.   D/C telemetry and PIV.

## 2017-12-21 NOTE — Care Management Important Message (Signed)
Copy of signed IM left with patient in room.  

## 2017-12-21 NOTE — Clinical Social Work Note (Signed)
Patient is medically ready for discharge today. CSW notified patient's [name] [phone] that patient will go to [facility] today. Daughter is in agreement and asked that patient be transported by EMS. RN will call report and call for EMS.   [place], [place]

## 2017-12-21 NOTE — Telephone Encounter (Signed)
-----   Message from Irine Seal, MD sent at 12/18/2017  6:24 PM EDT ----- This patient will need office f/u in 1-2 weeks.  He may have a foley and need a voiding trial at that time.

## 2017-12-21 NOTE — Consult Note (Signed)
[place] Nurse wound consult note Reason for Consult: foot ulcer, Unna's boot I have assessed the patient, he has a history of the left foot wound for several months per the patient's son. He has an BKA on the right LE. He has a weak pulse in the LLE and no edema.  Feel that Unna's boot which is used for venous stasis dx and compression therapy would not benefit this patient. With PAD history would be cautious of any type of compression. His son reports a "wound care doctor" was seeing him at [place] type: Full thickness ulceration left lateral foot; 4.5cm x 0.5cm x 0.1cm; <5% yellow along the proximal end of the wound, otherwise dark and ruddy. Non granular with a slight odor, yellow drainage. There was a hydrocolloid over this wound and this has allowed a collection of drainage with odor.  Would not assess for true odor until after the wound has been open for a while with absorbant dressing.  Partial thickness ulceration of the left lateral calf; 3.5cm x 3.0cm x 0.1cm; superficial skin loss that is bleeding quite a bit.  Most likely related to his current use of blood thinner.  Clean and moist.  Pressure Injury POA: NA Measurement:see above  Wound bed: see above  Drainage (amount, consistency, odor) see above  Periwound: intact, weak distal pulses, no edema  Dressing procedure/placement/frequency: Cover proximal wound with calcium alginate for hemostatic properties, top with foam Apply silver hydrofiber cut to to fit the left lateral foot wound for antimicrobial properties and absorption of drainage, top with foam Wrap from toes to knee with kerlix and ACE with no stretch. Change daily.    Wound culture obtained per MD order and bedside nurse request of the left lateral foot wound. Sample given to the bedside nurse Amy by [facility] nurse.  Discussed POC with patient and bedside nurse.  Re consult if needed, will not follow at this time. Thanks  Tahari Clabaugh [employer], RN,CWOCN, CNS, [place]  [phone])

## 2017-12-21 NOTE — Discharge Summary (Addendum)
Miguel Torres, is a 84 y.o. male  DOB 04-Sep-1930  MRN [id number].  Admission date:  12/16/2017  Admitting Physician  Vaughan Basta, MD  Discharge Date:  12/21/2017   Primary MD  [name], MD  Recommendations for primary care physician for things to follow:   Follow-up with PCP in 1 week Follow-up with urology Dr. Jeffie Pollock with urology in 2 weeks for voiding trials.  Admission Diagnosis  CHF (congestive heart failure) (HCC) [I50.9]   Discharge Diagnosis  CHF (congestive heart failure) ([place]) [I50.9]    Principal Problem:   Acute diastolic CHF (congestive heart failure) (HCC) Active Problems:   Acute diastolic (congestive) heart failure (HCC)   Goals of care, counseling/discussion   Palliative care encounter   Urinary retention      Past Medical History:  Diagnosis Date  . Anemia   . CHF (congestive heart failure) ([place])   . Diabetes mellitus without complication ([place])   . Hyperlipidemia   . Hypertension   . Skin cancer     Past Surgical History:  Procedure Laterality Date  . BASAL CELL CARCINOMA EXCISION    . History of eye surgery Bilateral   . MOHS SURGERY  04/2011   the top of his head  . Right BKA  09/12/2010   Dr. Delana Meyer; Secondary gangrenous right LE  . TONSILLECTOMY         History of present illness and  Hospital Course:     Kindly see H&P for history of present illness and admission details, please review complete Labs, Consult reports and Test reports for all details in brief  HPI  from the history and physical done on the day of admission  84 year old male patient admitted on August 14 for generalized weakness, shortness of breath, leg edema admitted CHF exacerbation.  Patient was here a week before this admission for acute renal failure, received IV fluids, diuretics were  stopped at this time he comes in with CHF exacerbation with fluid overload  . Hospital Course  #1 acute on chronic diastolic heart failure secondary to stopping diuretics during last admission due to renal failure, patient is admitted to telemetry this time, started on IV Lasix.  Patient with IV Lasix discontinued because of worsening creatinine patient creatinine was 1.45 on admission increased to 1.8 on August 17 .  Patient kidney function today showed stable creatinine around 1.7.  So patient needs close follow-up with PCP, primary nephrologist.  Discontinued IV Lasix.  Patient will be small dose Lasix at discharge. 2. urine retention secondary to underlying BPH, unable to urinate so patient is seen by urology because of difficulty placing Foley catheter by nurse seen by Dr. Thurmond Butts from urology, Foley catheter is placed with decrease in urine retention, started on Flomax.  Patient is to follow-up with [facility] urology in 1 to 2 weeks for voiding trials.  #3  Testicular cyst, patient needs follow-up ultrasound in 3 to 6 months (follow-up with urology. Epididymitis,: Received doxycycline in the hospital. 4.  Diabetes mellitus type 2: Discussed metformin, due to renal failure, heart failure.  Continue glipizide, sliding scale coverage.  #5 right leg pain, patient has chronic ulcer in the left foot, followed by vascular as an outpatient, wound care is consulted for dressing change instructions.  Seen by wound care nurse, recommend apply silver Hydrofiber dressing, top with foam wrap from toes to knee with Kerlix and Ace wrap with no stretch, patient needs dressing changes every day.  Wound cultures are obtained today,  patient needs wound cultures to be followed.  But wound care nurse said that wound does not look infected.  Patient received antibiotics doxycycline in the hospital  disposition discharged to  [facility].   Discharge Condition: Stable   Follow UP   Contact information for  follow-up providers    [place] Follow up on 12/24/2017.   Specialty:  Cardiology Why:  at 12:00pm Contact information: [place] [phone]       [name], MD. Schedule an appointment as soon as possible for a visit in 1 week(s).   Specialty:  Family Medicine Contact information: [address] [place] [address] [phone]        Irine Seal, MD Follow up in 2 week(s).   Specialty:  Urology Contact information: [place] [address] [phone]            Contact information for after-discharge care    [facility] .   Service:  Skilled Nursing Contact information: [place] [phone]                    Discharge Instructions  and  Discharge Medications      Allergies as of 12/21/2017   No Known Allergies     Medication List    STOP taking these medications   metFORMIN 500 MG 24 hr tablet Commonly known as:  GLUCOPHAGE-XR   pioglitazone 30 MG tablet Commonly known as:  ACTOS     TAKE these medications   acetaminophen 325 MG tablet Commonly known as:  TYLENOL Take 2 tablets (650 mg total) by mouth every 6 (six) hours as needed for mild pain (or Fever >/= 101).   apixaban 2.5 MG Tabs tablet Commonly known as:  ELIQUIS Take 1 tablet (2.5 mg total) by mouth 2 (two) times daily.   ascorbic acid 250 MG tablet Commonly known as:  VITAMIN C Take 250 mg by mouth daily.   felodipine 5 MG 24 hr tablet Commonly known as:  PLENDIL Take 1 tablet (5 mg total) by mouth daily.   ferrous sulfate 325 (65 FE) MG tablet Take 325 mg by mouth daily.   furosemide 20 MG tablet Commonly known as:  LASIX Take 1 tablet (20 mg total) by mouth 2 (two) times daily.   glipiZIDE 10 MG tablet Commonly known as:   GLUCOTROL TAKE 1 TABLET (10 MG TOTAL) BY MOUTH DAILY. What changed:  See the new instructions.   HYDROcodone-acetaminophen 5-325 MG tablet Commonly known as:  NORCO/VICODIN Take 1 tablet by mouth every 6 (six) hours as needed for moderate pain.   loratadine 10 MG tablet Commonly known as:  CLARITIN Take 10 mg by mouth daily.   lovastatin 40 MG tablet Commonly known as:  MEVACOR TAKE 1 TABLET (40 MG TOTAL) BY MOUTH DAILY. What changed:  See the new instructions.   megestrol 400 MG/10ML suspension Commonly known as:  MEGACE Take 10 mLs (400 mg total) by mouth 2 (two) times daily.   montelukast 10 MG tablet Commonly known as:  SINGULAIR TAKE 1 TABLET (10 MG TOTAL) BY MOUTH EVERY EVENING. What changed:  See the new instructions.   multivitamin tablet Take 1 tablet by mouth daily.   multivitamin-lutein Caps capsule Take 1 capsule by mouth daily.   nutrition supplement (JUVEN) Pack Take 1  packet by mouth 2 (two) times daily between meals.   feeding supplement (ENSURE ENLIVE) Liqd Take 237 mLs by mouth 2 (two) times daily between meals.   pantoprazole 40 MG tablet Commonly known as:  PROTONIX Take 40 mg by mouth daily.   polyethylene glycol packet Commonly known as:  MIRALAX / GLYCOLAX Take 17 g by mouth daily as needed for mild constipation.   tamsulosin 0.4 MG Caps capsule Commonly known as:  FLOMAX Take 1 capsule (0.4 mg total) by mouth daily. Start taking on:  12/22/2017   Vitamin D (Ergocalciferol) 50000 units Caps capsule Commonly known as:  DRISDOL TAKE ONE CAPSULE BY MOUTH EVERY 7 DAYS What changed:  See the new instructions.         Diet and Activity recommendation: See Discharge Instructions above   Consults obtained -cardiology, urology, wound care   Major procedures and Radiology Reports - PLEASE review detailed and final reports for all details, in brief -      Dg Chest 2 View  Result Date: 12/17/2017 CLINICAL DATA:  CHF. EXAM: CHEST - 2  VIEW COMPARISON:  12/16/2017. FINDINGS: Cardiomegaly with bilateral increased interstitial prominence. CHF could present this fashion. Pneumonitis can't be excluded. Bilateral pleural effusions are noted. No pneumothorax. IMPRESSION: Cardiomegaly with bilateral increase in interstitial prominence and bilateral pleural effusion. Findings most consistent CHF. Electronically Signed   By: Marcello Moores  Register   On: 12/17/2017 07:28   Dg Chest 2 View  Result Date: 12/16/2017 CLINICAL DATA:  Shortness of breath for 1 week EXAM: CHEST - 2 VIEW COMPARISON:  12/07/2017 FINDINGS: Bilateral diffuse interstitial thickening. No focal consolidation, pleural effusion or pneumothorax. Stable cardiomegaly. No acute osseous abnormality. IMPRESSION: Cardiomegaly with mild pulmonary vascular congestion. Electronically Signed   By: Kathreen Devoid   On: 12/16/2017 13:50   Dg Chest 2 View  Result Date: 12/07/2017 CLINICAL DATA:  Weakness.  Hypertension. EXAM: CHEST - 2 VIEW COMPARISON:  November 01, 2017 FINDINGS: There is an equivocal left pleural effusion. There is no edema or consolidation. Heart is upper normal in size with pulmonary vascularity normal. No adenopathy. Bones are osteoporotic. There is degenerative change in the thoracic spine. There is aortic atherosclerosis. IMPRESSION: Equivocal left pleural effusion. No edema or consolidation. Stable cardiac silhouette. There is aortic atherosclerosis. Bones osteoporotic. Aortic Atherosclerosis (ICD10-I70.0). Electronically Signed   By: Lowella Grip III M.D.   On: 12/07/2017 11:18   US Pelvis Limited (transabdominal Only)  Result Date: 12/18/2017 CLINICAL DATA:  Urinary retention. EXAM: LIMITED ULTRASOUND OF PELVIS TECHNIQUE: Limited transabdominal ultrasound examination of the pelvis was performed. COMPARISON:  No recent prior pelvic ultrasound. FINDINGS: The urinary bladder has a normal appearance for the degree of distention. Pre-void volume:  575 ml Post-void volume:  Patient could not void. Other findings:  None. IMPRESSION: Prevoid volume of 575 mL.  Patient could not void. Electronically Signed   By: Marcello Moores  Register   On: 12/18/2017 12:57   US Scrotum W/doppler  Result Date: 12/18/2017 CLINICAL DATA:  Swelling EXAM: SCROTAL ULTRASOUND DOPPLER ULTRASOUND OF THE TESTICLES TECHNIQUE: Complete ultrasound examination of the testicles, epididymis, and other scrotal structures was performed. Color and spectral Doppler ultrasound were also utilized to evaluate blood flow to the testicles. COMPARISON:  None. FINDINGS: Right testicle Measurements: 4.3 x 2.2 x 3.6 cm. No mass or microlithiasis visualized. Left testicle Measurements: 4.9 x 2.4 x 2.9 cm. No mass or microlithiasis visualized. Right epididymis:  Small complex cyst measures 5 mm. Left epididymis:  Not well visualized  Hydrocele: Large left hydrocele with internal echoes. This compresses the left testicle along the scrotal wall. Varicocele:  None visualized. Pulsed Doppler interrogation of both testes demonstrates normal low resistance arterial and venous waveforms bilaterally. There is a small cystic area within the left scrotum adjacent to the left testicle with mural calcification. This area measures 1.8 x 1.4 x 1.3 cm. This appears to be external to the testicle. IMPRESSION: No visible testicular abnormality. Very large left hydrocele with internal echoes/debris. Small cystic structure adjacent to the left testicle which appears to be external to the testicle. Small mural calcifications/nodule. This is of unknown etiology but appears benign. This could be followed with repeat ultrasound in 3-6 months to ensure stability. Electronically Signed   By: Rolm Baptise M.D.   On: 12/18/2017 12:22    Micro Results     No results found for this or any previous visit (from the past 240 hour(s)).     Today   Subjective:   Miguel Torres today  stable for discharge to [facility] Objective:   Blood  pressure 135/61, pulse 94, temperature 98.6 F (37 C), temperature source Oral, resp. rate 17, height 5\' 8"  (1.727 m), weight 103.8 kg, SpO2 95 %.   Intake/Output Summary (Last 24 hours) at 12/21/2017 1202 Last data filed at 12/20/2017 2300 Gross per 24 hour  Intake 2766 ml  Output 1100 ml  Net 1666 ml    Exam Awake Alert, Oriented x 3, No new F.N deficits, Normal affect [place].AT,PERRAL Supple Neck,No JVD, No cervical lymphadenopathy appriciated.  Symmetrical Chest wall movement, Good air movement bilaterally, CTAB RRR,No Gallops,Rubs or new Murmurs, No Parasternal Heave +ve B.Sounds, Abd Soft, Non tender, No organomegaly appriciated, No rebound -guarding or rigidity. Has right BKA, left leg dressing present Data Review   CBC w Diff:  Lab Results  Component Value Date   WBC 8.7 12/17/2017   HGB 10.5 (L) 12/17/2017   HGB 11.0 (L) 10/15/2016   HCT 30.2 (L) 12/17/2017   HCT 34.5 (L) 10/15/2016   PLT 160 12/17/2017   PLT 183 10/15/2016   LYMPHOPCT 7 11/01/2017   LYMPHOPCT 20.7 09/30/2012   MONOPCT 8 11/01/2017   MONOPCT 14.2 09/30/2012   EOSPCT 1 11/01/2017   EOSPCT 2.9 09/30/2012   BASOPCT 1 11/01/2017   BASOPCT 0.9 09/30/2012    CMP:  Lab Results  Component Value Date   NA 134 (L) 12/19/2017   NA 138 10/15/2016   NA 140 09/30/2012   K 4.1 12/19/2017   K 4.5 09/30/2012   CL 97 (L) 12/19/2017   CL 111 (H) 09/30/2012   CO2 29 12/19/2017   CO2 24 09/30/2012   BUN 66 (H) 12/19/2017   BUN 30 (H) 10/15/2016   BUN 28 (H) 09/30/2012   CREATININE 1.80 (H) 12/19/2017   CREATININE 1.33 (H) 09/30/2012   GLU 109 07/04/2014   PROT 6.9 12/07/2017   PROT 6.7 10/15/2016   ALBUMIN 3.1 (L) 12/07/2017   ALBUMIN 3.5 10/15/2016   BILITOT 1.0 12/07/2017   BILITOT 0.4 10/15/2016   ALKPHOS 106 12/07/2017   AST 43 (H) 12/07/2017   ALT 15 12/07/2017  .   Total Time in preparing paper work, data evaluation and todays exam - 35 minutes  Epifanio Lesches M.D on 12/21/2017 at  12:02 PM is a    Note: This dictation was prepared with Dragon dictation along with smaller phrase technology. Any transcriptional errors that result from this process are unintentional.

## 2017-12-21 NOTE — Telephone Encounter (Signed)
App made per Dr.Wrenn   Patient is still admitted   [facility]

## 2017-12-21 NOTE — Discharge Instructions (Signed)
Wound care: Calcium alginate covering the left lateral proximal ulceration, top with foam dressing.   Silver hydrofiber cut to fit and placed over the left lateral foot wound, top with foam.  Wrap leg with kerlix and ACE wrap with no stretch.    Change daily.

## 2017-12-22 ENCOUNTER — Encounter: Payer: Self-pay | Admitting: Physician Assistant

## 2017-12-22 NOTE — Telephone Encounter (Signed)
This encounter was created in error - please disregard.

## 2017-12-23 DIAGNOSIS — I5032 Chronic diastolic (congestive) heart failure: Secondary | ICD-10-CM | POA: Diagnosis not present

## 2017-12-23 DIAGNOSIS — N401 Enlarged prostate with lower urinary tract symptoms: Secondary | ICD-10-CM

## 2017-12-23 DIAGNOSIS — N138 Other obstructive and reflux uropathy: Secondary | ICD-10-CM | POA: Insufficient documentation

## 2017-12-23 DIAGNOSIS — I4892 Unspecified atrial flutter: Secondary | ICD-10-CM | POA: Diagnosis not present

## 2017-12-23 DIAGNOSIS — Z89511 Acquired absence of right leg below knee: Secondary | ICD-10-CM | POA: Diagnosis not present

## 2017-12-24 ENCOUNTER — Telehealth: Payer: Self-pay | Admitting: Family

## 2017-12-24 ENCOUNTER — Ambulatory Visit: Payer: Medicare Other | Admitting: Family

## 2017-12-24 LAB — AEROBIC CULTURE  (SUPERFICIAL SPECIMEN)

## 2017-12-24 LAB — AEROBIC CULTURE W GRAM STAIN (SUPERFICIAL SPECIMEN)

## 2017-12-24 NOTE — Telephone Encounter (Signed)
Patient did not show for his [facility] Failure [facility] appointment on 12/24/17. Will attempt to reschedule.

## 2017-12-29 ENCOUNTER — Inpatient Hospital Stay: Payer: Medicare Other | Admitting: Family Medicine

## 2017-12-30 ENCOUNTER — Other Ambulatory Visit: Payer: Self-pay

## 2017-12-30 ENCOUNTER — Ambulatory Visit (INDEPENDENT_AMBULATORY_CARE_PROVIDER_SITE_OTHER): Payer: Medicare Other | Admitting: Urology

## 2017-12-30 ENCOUNTER — Encounter: Payer: Self-pay | Admitting: Urology

## 2017-12-30 VITALS — BP 142/78 | HR 102 | Resp 20

## 2017-12-30 DIAGNOSIS — R339 Retention of urine, unspecified: Secondary | ICD-10-CM | POA: Diagnosis not present

## 2017-12-30 NOTE — Progress Notes (Signed)
12/30/2017 8:58 AM   Julieanne Manson Creason [birth year] [id number]  Referring provider: [name], MD [address] [place], [place] [address]  Chief Complaint  Patient presents with  . Follow-up    HPI: 84 year old male seen as an inpatient consult by Dr. Jeffie Pollock on 12/18/2017 for urinary retention and scrotal edema.  A scrotal ultrasound showed a intratesticular cyst and a hydrocele but no evidence of infection.  PVR was 700 mL.  A 16 French Foley catheter was placed without difficulty.  He was discharged on tamsulosin and presents today for a voiding trial.  He is currently in a skilled nursing facility.  He is complaining of irritation at the urethral meatus.  Prior to that hospitalization there was no history of bothersome lower urinary tract symptoms or urinary retention.   PMH: Past Medical History:  Diagnosis Date  . Anemia   . CHF (congestive heart failure) ([place])   . Diabetes mellitus without complication ([place])   . Hyperlipidemia   . Hypertension   . Skin cancer     Surgical History: Past Surgical History:  Procedure Laterality Date  . BASAL CELL CARCINOMA EXCISION    . History of eye surgery Bilateral   . MOHS SURGERY  04/2011   the top of his head  . Right BKA  09/12/2010   Dr. Delana Meyer; Secondary gangrenous right LE  . TONSILLECTOMY      Home Medications:  Allergies as of 12/30/2017   No Known Allergies     Medication List        Accurate as of 12/30/17  8:58 AM. Always use your most recent med list.          acetaminophen 325 MG tablet Commonly known as:  TYLENOL Take 2 tablets (650 mg total) by mouth every 6 (six) hours as needed for mild pain (or Fever >/= 101).   apixaban 2.5 MG Tabs tablet Commonly known as:  ELIQUIS Take 1 tablet (2.5 mg total) by mouth 2 (two) times daily.   ascorbic acid 250 MG tablet Commonly known as:  VITAMIN C Take 250 mg by mouth daily.   felodipine 5 MG 24 hr tablet Commonly known as:  PLENDIL Take  1 tablet (5 mg total) by mouth daily.   ferrous sulfate 325 (65 FE) MG tablet Take 325 mg by mouth daily.   furosemide 20 MG tablet Commonly known as:  LASIX Take 1 tablet (20 mg total) by mouth 2 (two) times daily.   glipiZIDE 10 MG tablet Commonly known as:  GLUCOTROL TAKE 1 TABLET (10 MG TOTAL) BY MOUTH DAILY.   insulin aspart 100 UNIT/ML injection Commonly known as:  novoLOG Inject 0-9 Units into the skin 3 (three) times daily with meals.   loratadine 10 MG tablet Commonly known as:  CLARITIN Take 10 mg by mouth daily.   lovastatin 40 MG tablet Commonly known as:  MEVACOR TAKE 1 TABLET (40 MG TOTAL) BY MOUTH DAILY.   megestrol 400 MG/10ML suspension Commonly known as:  MEGACE Take 10 mLs (400 mg total) by mouth 2 (two) times daily.   montelukast 10 MG tablet Commonly known as:  SINGULAIR TAKE 1 TABLET (10 MG TOTAL) BY MOUTH EVERY EVENING.   multivitamin tablet Take 1 tablet by mouth daily.   multivitamin-lutein Caps capsule Take 1 capsule by mouth daily.   nutrition supplement (JUVEN) Pack Take 1 packet by mouth 2 (two) times daily between meals.   feeding supplement (ENSURE ENLIVE) Liqd Take 237 mLs by  mouth 2 (two) times daily between meals.   pantoprazole 40 MG tablet Commonly known as:  PROTONIX Take 40 mg by mouth daily.   polyethylene glycol packet Commonly known as:  MIRALAX / GLYCOLAX Take 17 g by mouth daily as needed for mild constipation.   tamsulosin 0.4 MG Caps capsule Commonly known as:  FLOMAX Take 1 capsule (0.4 mg total) by mouth daily.   Vitamin D (Ergocalciferol) 50000 units Caps capsule Commonly known as:  DRISDOL TAKE ONE CAPSULE BY MOUTH EVERY 7 DAYS       Allergies: No Known Allergies  Family History: Family History  Problem Relation Age of Onset  . Diabetes Mother   . Arthritis Sister   . Diabetes Daughter     Social History:  reports that he has never smoked. He has never used smokeless tobacco. He reports that he  does not drink alcohol or use drugs.  ROS: UROLOGY Frequent Urination?: No Hard to postpone urination?: No Burning/pain with urination?: No Get up at night to urinate?: No Leakage of urine?: No Urine stream starts and stops?: No Trouble starting stream?: No Do you have to strain to urinate?: No Blood in urine?: No Urinary tract infection?: No Sexually transmitted disease?: No Injury to kidneys or bladder?: No Painful intercourse?: No Weak stream?: No Erection problems?: No Penile pain?: Yes  Gastrointestinal Nausea?: No Vomiting?: No Indigestion/heartburn?: No Diarrhea?: No Constipation?: No  Constitutional Fever: No Night sweats?: No Weight loss?: No Fatigue?: No  Skin Skin rash/lesions?: Yes Itching?: Yes  Eyes Blurred vision?: No Double vision?: No  Ears/Nose/Throat Sore throat?: No Sinus problems?: No  Hematologic/Lymphatic Swollen glands?: No Easy bruising?: Yes  Cardiovascular Leg swelling?: Yes Chest pain?: No  Respiratory Cough?: No Shortness of breath?: No  Endocrine Excessive thirst?: No  Musculoskeletal Back pain?: No Joint pain?: No  Neurological Headaches?: No Dizziness?: No  Psychologic Depression?: No Anxiety?: No  Physical Exam: BP (!) 142/78 (BP Location: Left Arm, Patient Position: Sitting, Cuff Size: Large)   Pulse (!) 102   Resp 20   Constitutional:  Alert HEENT: [place] AT, moist mucus membranes.  Trachea midline, no masses. Cardiovascular: No clubbing, cyanosis, or edema. Respiratory: Normal respiratory effort, no increased work of breathing. GI: Abdomen is soft, nontender, nondistended, no abdominal masses GU: No CVA tenderness.  Foley catheter draining clear urine.  Mild penile edema without erythema. Lymph: No cervical or inguinal lymphadenopathy. Skin: No rashes, bruises or suspicious lesions. Neurologic: Grossly intact, no focal deficits, prosthetic right leg   Assessment & Plan:   84 year old male with  acute urinary retention.  His Foley catheter was removed.  An order was written for his SNF to replace the catheter for recurrent retention.  If he voids successfully will follow-up 2 to 4 weeks for symptom check and bladder scan.   Abbie Sons, [facility] [address] [place], [place] [address] [phone]

## 2018-01-07 ENCOUNTER — Ambulatory Visit (INDEPENDENT_AMBULATORY_CARE_PROVIDER_SITE_OTHER): Payer: Medicare Other | Admitting: Vascular Surgery

## 2018-01-07 ENCOUNTER — Encounter (INDEPENDENT_AMBULATORY_CARE_PROVIDER_SITE_OTHER): Payer: Medicare Other

## 2018-01-07 NOTE — Progress Notes (Deleted)
MRN : [id number]  Miguel Torres is a 84 y.o. (Nov 09, 1930) male who presents with chief complaint of No chief complaint on file. [name]  History of Present Illness: The patient returns to the office for followup evaluation regarding leg swelling.  The swelling has improved quite a bit and the pain associated with swelling has decreased substantially. There have not been any interval development of a ulcerations or wounds.  Since the previous visit the patient has been wearing graduated compression stockings and has noted little significant improvement in the lymphedema. The patient has been using compression routinely morning until night.  The patient also states elevation during the day and exercise is being done too.     No outpatient medications have been marked as taking for the 01/07/18 encounter (Appointment) with Delana Meyer, Dolores Lory, MD.    Past Medical History:  Diagnosis Date  . Anemia   . CHF (congestive heart failure) ([place])   . Diabetes mellitus without complication ([place])   . Hyperlipidemia   . Hypertension   . Skin cancer     Past Surgical History:  Procedure Laterality Date  . BASAL CELL CARCINOMA EXCISION    . History of eye surgery Bilateral   . MOHS SURGERY  04/2011   the top of his head  . Right BKA  09/12/2010   Dr. Delana Meyer; Secondary gangrenous right LE  . TONSILLECTOMY      Social History Social History   Tobacco Use  . Smoking status: Never Smoker  . Smokeless tobacco: Never Used  Substance Use Topics  . Alcohol use: No  . Drug use: No    Family History Family History  Problem Relation Age of Onset  . Diabetes Mother   . Arthritis Sister   . Diabetes Daughter     No Known Allergies   REVIEW OF SYSTEMS (Negative unless checked)  Constitutional: [] Weight loss  [] Fever  [] Chills Cardiac: [] Chest pain   [] Chest pressure   [] Palpitations   [] Shortness of breath when laying flat   [] Shortness of breath with exertion. Vascular:  [] Pain in  legs with walking   [] Pain in legs at rest  [] History of DVT   [] Phlebitis   [] Swelling in legs   [] Varicose veins   [] Non-healing ulcers Pulmonary:   [] Uses home oxygen   [] Productive cough   [] Hemoptysis   [] Wheeze  [] COPD   [] Asthma Neurologic:  [] Dizziness   [] Seizures   [] History of stroke   [] History of TIA  [] Aphasia   [] Vissual changes   [] Weakness or numbness in arm   [] Weakness or numbness in leg Musculoskeletal:   [] Joint swelling   [] Joint pain   [] Low back pain Hematologic:  [] Easy bruising  [] Easy bleeding   [] Hypercoagulable state   [] Anemic Gastrointestinal:  [] Diarrhea   [] Vomiting  [] Gastroesophageal reflux/heartburn   [] Difficulty swallowing. Genitourinary:  [] Chronic kidney disease   [] Difficult urination  [] Frequent urination   [] Blood in urine Skin:  [] Rashes   [] Ulcers  Psychological:  [] History of anxiety   []  History of major depression.  Physical Examination  There were no vitals filed for this visit. There is no height or weight on file to calculate BMI. Gen: WD/WN, NAD Head: [place]/AT, No temporalis wasting.  Ear/Nose/Throat: Hearing grossly intact, nares w/o erythema or drainage Eyes: PER, EOMI, sclera nonicteric.  Neck: Supple, no large masses.   Pulmonary:  Good air movement, no audible wheezing bilaterally, no use of accessory muscles.  Cardiac: RRR, no JVD Vascular: *** Vessel Right  Left  Radial Palpable Palpable  Ulnar Palpable Palpable  Brachial Palpable Palpable  Carotid Palpable Palpable  Femoral Palpable Palpable  Popliteal Palpable Palpable  PT Palpable Palpable  DP Palpable Palpable  Gastrointestinal: Non-distended. No guarding/no peritoneal signs.  Musculoskeletal: M/S 5/5 throughout.  No deformity or atrophy.  Neurologic: CN 2-12 intact. Symmetrical.  Speech is fluent. Motor exam as listed above. Psychiatric: Judgment intact, Mood & affect appropriate for pt's clinical situation. Dermatologic: No rashes or ulcers noted.  No changes consistent  with cellulitis. Lymph : No lichenification or skin changes of chronic lymphedema.  CBC Lab Results  Component Value Date   WBC 8.7 12/17/2017   HGB 10.5 (L) 12/17/2017   HCT 30.2 (L) 12/17/2017   MCV 92.1 12/17/2017   PLT 160 12/17/2017    BMET    Component Value Date/Time   NA 133 (L) 12/21/2017 1238   NA 138 10/15/2016 1611   NA 140 09/30/2012 1609   K 4.1 12/21/2017 1238   K 4.5 09/30/2012 1609   CL 97 (L) 12/21/2017 1238   CL 111 (H) 09/30/2012 1609   CO2 25 12/21/2017 1238   CO2 24 09/30/2012 1609   GLUCOSE 180 (H) 12/21/2017 1238   GLUCOSE 90 09/30/2012 1609   BUN 80 (H) 12/21/2017 1238   BUN 30 (H) 10/15/2016 1611   BUN 28 (H) 09/30/2012 1609   CREATININE 1.79 (H) 12/21/2017 1238   CREATININE 1.33 (H) 09/30/2012 1609   CALCIUM 8.5 (L) 12/21/2017 1238   CALCIUM 8.5 09/30/2012 1609   GFRNONAA 33 (L) 12/21/2017 1238   GFRNONAA 50 (L) 09/30/2012 1609   GFRAA 38 (L) 12/21/2017 1238   GFRAA 58 (L) 09/30/2012 1609   CrCl cannot be calculated (Unknown ideal weight.).  COAG No results found for: INR, PROTIME  Radiology Dg Chest 2 View  Result Date: 12/17/2017 CLINICAL DATA:  CHF. EXAM: CHEST - 2 VIEW COMPARISON:  12/16/2017. FINDINGS: Cardiomegaly with bilateral increased interstitial prominence. CHF could present this fashion. Pneumonitis can't be excluded. Bilateral pleural effusions are noted. No pneumothorax. IMPRESSION: Cardiomegaly with bilateral increase in interstitial prominence and bilateral pleural effusion. Findings most consistent CHF. Electronically Signed   By: Marcello Moores  Register   On: 12/17/2017 07:28   Dg Chest 2 View  Result Date: 12/16/2017 CLINICAL DATA:  Shortness of breath for 1 week EXAM: CHEST - 2 VIEW COMPARISON:  12/07/2017 FINDINGS: Bilateral diffuse interstitial thickening. No focal consolidation, pleural effusion or pneumothorax. Stable cardiomegaly. No acute osseous abnormality. IMPRESSION: Cardiomegaly with mild pulmonary vascular  congestion. Electronically Signed   By: Kathreen Devoid   On: 12/16/2017 13:50   US Pelvis Limited (transabdominal Only)  Result Date: 12/18/2017 CLINICAL DATA:  Urinary retention. EXAM: LIMITED ULTRASOUND OF PELVIS TECHNIQUE: Limited transabdominal ultrasound examination of the pelvis was performed. COMPARISON:  No recent prior pelvic ultrasound. FINDINGS: The urinary bladder has a normal appearance for the degree of distention. Pre-void volume:  575 ml Post-void volume: Patient could not void. Other findings:  None. IMPRESSION: Prevoid volume of 575 mL.  Patient could not void. Electronically Signed   By: Marcello Moores  Register   On: 12/18/2017 12:57   US Scrotum W/doppler  Result Date: 12/18/2017 CLINICAL DATA:  Swelling EXAM: SCROTAL ULTRASOUND DOPPLER ULTRASOUND OF THE TESTICLES TECHNIQUE: Complete ultrasound examination of the testicles, epididymis, and other scrotal structures was performed. Color and spectral Doppler ultrasound were also utilized to evaluate blood flow to the testicles. COMPARISON:  None. FINDINGS: Right testicle Measurements: 4.3 x 2.2 x 3.6 cm.  No mass or microlithiasis visualized. Left testicle Measurements: 4.9 x 2.4 x 2.9 cm. No mass or microlithiasis visualized. Right epididymis:  Small complex cyst measures 5 mm. Left epididymis:  Not well visualized Hydrocele: Large left hydrocele with internal echoes. This compresses the left testicle along the scrotal wall. Varicocele:  None visualized. Pulsed Doppler interrogation of both testes demonstrates normal low resistance arterial and venous waveforms bilaterally. There is a small cystic area within the left scrotum adjacent to the left testicle with mural calcification. This area measures 1.8 x 1.4 x 1.3 cm. This appears to be external to the testicle. IMPRESSION: No visible testicular abnormality. Very large left hydrocele with internal echoes/debris. Small cystic structure adjacent to the left testicle which appears to be external to the  testicle. Small mural calcifications/nodule. This is of unknown etiology but appears benign. This could be followed with repeat ultrasound in 3-6 months to ensure stability. Electronically Signed   By: Rolm Baptise M.D.   On: 12/18/2017 12:22     Assessment/Plan 1. Lymphedema ***  2. Amputation of right lower extremity below knee upon examination ([place]) ***  3. Diabetes mellitus with nephropathy (H[facility]) ***  4. Essential hypertension ***  5. Acute diastolic congestive heart failure ([place]) ***   Hortencia Pilar, MD  01/07/2018 12:51 PM

## 2018-01-13 ENCOUNTER — Ambulatory Visit (INDEPENDENT_AMBULATORY_CARE_PROVIDER_SITE_OTHER): Payer: No Typology Code available for payment source

## 2018-01-13 ENCOUNTER — Encounter (INDEPENDENT_AMBULATORY_CARE_PROVIDER_SITE_OTHER): Payer: Self-pay | Admitting: Nurse Practitioner

## 2018-01-13 ENCOUNTER — Ambulatory Visit (INDEPENDENT_AMBULATORY_CARE_PROVIDER_SITE_OTHER): Payer: Medicare Other | Admitting: Nurse Practitioner

## 2018-01-13 VITALS — BP 168/81 | HR 74 | Resp 16 | Ht 69.0 in | Wt 219.0 lb

## 2018-01-13 DIAGNOSIS — R600 Localized edema: Secondary | ICD-10-CM | POA: Diagnosis not present

## 2018-01-13 DIAGNOSIS — E785 Hyperlipidemia, unspecified: Secondary | ICD-10-CM

## 2018-01-13 DIAGNOSIS — I10 Essential (primary) hypertension: Secondary | ICD-10-CM

## 2018-01-13 DIAGNOSIS — I890 Lymphedema, not elsewhere classified: Secondary | ICD-10-CM

## 2018-01-13 DIAGNOSIS — I739 Peripheral vascular disease, unspecified: Secondary | ICD-10-CM

## 2018-01-13 DIAGNOSIS — L97522 Non-pressure chronic ulcer of other part of left foot with fat layer exposed: Secondary | ICD-10-CM | POA: Diagnosis not present

## 2018-01-13 NOTE — Progress Notes (Signed)
Subjective:    Patient ID: MARCELLIUS Torres, male    DOB: 1930/06/30, 84 y.o.   MRN: [id number] No chief complaint on file.   HPI  Miguel Torres is a 84 y.o. male who returns to the office for followup evaluation regarding leg swelling.  The swelling has persisted and the pain associated with swelling continues. There have not been any interval development of ulcerations or wounds.  Since the previous visit the patient has been wearing unna wraps on this left lower extremity (right is a below knee amputation) and has noted improvement in the lymphedema and small venous ulcers.   The patient also states elevation during the day and exercise is being done too.  The patient also has a small ulceration to his lateral left foot edge.  His daughter states that his wound has been healing since we began using wraps.  His renal wraps are changed by his nursing facility.    Today Miguel Torres underwent ABIs as well as has a lower venous reflux study.  His ABIs revealed an ABI of 0.93 on the lower left extremity with a TBI of 0.26.  He had monophasic flow throughout.  The patient also has reflux in the bilateral common femoral veins.  As well as in the popliteal vein of the right lower extremity.  There is also no evidence of DVT in either extremity.     Constitutional: [] Weight loss  [] Fever  [] Chills Cardiac: [] Chest pain   [] Chest pressure   [] Palpitations   [] Shortness of breath when laying flat   [] Shortness of breath with exertion. Vascular:  [] Pain in legs with walking   [] Pain in legs with standing  [] History of DVT   [] Phlebitis   [x] Swelling in legs   [] Varicose veins   [] Non-healing ulcers Pulmonary:   [] Uses home oxygen   [] Productive cough   [] Hemoptysis   [] Wheeze  [] COPD   [] Asthma Neurologic:  [] Dizziness   [] Seizures   [] History of stroke   [] History of TIA  [] Aphasia   [] Vissual changes   [] Weakness or numbness in arm   [x] Weakness or numbness in leg Musculoskeletal:    [] Joint swelling   [] Joint pain   [] Low back pain Hematologic:  [] Easy bruising  [] Easy bleeding   [] Hypercoagulable state   [] Anemic Gastrointestinal:  [] Diarrhea   [] Vomiting  [] Gastroesophageal reflux/heartburn   [] Difficulty swallowing. Genitourinary:  [] Chronic kidney disease   [] Difficult urination  [] Frequent urination   [] Blood in urine Skin:  [] Rashes   [] Ulcers  Psychological:  [] History of anxiety   []  History of major depression.     Objective:   Physical Exam  BP (!) 168/81 (BP Location: Left Arm)   Pulse 74   Resp 16   Ht 5\' 9"  (1.753 m)   Wt 219 lb (99.3 kg)   BMI 32.34 kg/m     Past Medical History:  Diagnosis Date  . Anemia   . CHF (congestive heart failure) ([place])   . Diabetes mellitus without complication ([place])   . Hyperlipidemia   . Hypertension   . Skin cancer      Gen: WD/WN, NAD Head: [place]/AT, No temporalis wasting.  Ear/Nose/Throat: Hearing grossly intact, nares w/o erythema or drainage Eyes: PER, EOMI, sclera nonicteric.  Neck: Supple, no masses.  No JVD.  Pulmonary:  Good air movement, no use of accessory muscles.  Cardiac: RRR Vascular:  Multiple scattered open blisters.  Wound on lateral left foot. Vessel Right Left  Radial  palpable  palpable  PT n/a Not palpable  DP n/a Not palpable  Gastrointestinal: soft, non-distended. No guarding/no peritoneal signs.  Musculoskeletal: M/S 5/5 throughout.  No deformity or atrophy.  Neurologic: Pain and light touch intact in extremities.  Symmetrical.  Speech is fluent. Motor exam as listed above. Psychiatric: Judgment intact, Mood & affect appropriate for pt's clinical situation. Dermatologic: Venous rashes. Ulcers Noted.  No changes consistent with cellulitis. Lymph : No Cervical lymphadenopathy, no lichenification or skin changes of chronic lymphedema.   Social History   Socioeconomic History  . Marital status: Married    Spouse name: Not on file  . Number of children: 5  . Years of education:  5th grade  . Highest education level: Not on file  Occupational History  . Occupation: Retired  Scientific laboratory technician  . Financial resource strain: Not on file  . Food insecurity:    Worry: Not on file    Inability: Not on file  . Transportation needs:    Medical: Not on file    Non-medical: Not on file  Tobacco Use  . Smoking status: Never Smoker  . Smokeless tobacco: Never Used  Substance and Sexual Activity  . Alcohol use: No  . Drug use: No  . Sexual activity: Not Currently  Lifestyle  . Physical activity:    Days per week: Not on file    Minutes per session: Not on file  . Stress: Not on file  Relationships  . Social connections:    Talks on phone: Not on file    Gets together: Not on file    Attends religious service: Not on file    Active member of club or organization: Not on file    Attends meetings of clubs or organizations: Not on file    Relationship status: Not on file  . Intimate partner violence:    Fear of current or ex partner: Not on file    Emotionally abused: Not on file    Physically abused: Not on file    Forced sexual activity: Not on file  Other Topics Concern  . Not on file  Social History Narrative   Lives at home by himself.  Ambulates with a walker   Has a  right prosthetic leg    Past Surgical History:  Procedure Laterality Date  . BASAL CELL CARCINOMA EXCISION    . History of eye surgery Bilateral   . MOHS SURGERY  04/2011   the top of his head  . Right BKA  09/12/2010   Dr. Delana Meyer; Secondary gangrenous right LE  . TONSILLECTOMY      Family History  Problem Relation Age of Onset  . Diabetes Mother   . Arthritis Sister   . Diabetes Daughter     No Known Allergies     Assessment & Plan:   1. PAD (peripheral artery disease) ([place]) Today Miguel Torres underwent ABIs as well as has a lower venous reflux study.  His ABIs revealed an ABI of 0.93 on the lower left extremity with a TBI of 0.26.  He had monophasic flow throughout.     Had a lengthy discussion with the daughter as to the results and the evidence of atherosclerotic changes of the left lower extremitie associated with ulceration and tissue loss of the foot.  This represents a limb threatening ischemia and places the patient at the risk for limb loss.  Patient's daughter stated that his wound was continuing to heal with her current interventions.  She did not  want to move forward with an angiogram at this time.  We will reassess his wound in 1 month.  If his wound is not improving, we will have another discussion with the daughter in regards to the necessity for an angiogram and possible intervention to improve blood flow.  The patient's daughter was instructed to contact the office sooner if the wound gently became worse or if the patient started to experience increased amount of pain.   2. Lymphedema  The patient also has reflux in the bilateral common femoral veins.  As well as in the popliteal vein of the right lower extremity.  There is also no evidence of DVT in either extremity.   The patient continues to have lower extremity swelling, however it is more improved with the use of Unna wraps.  The patient will continue to have weekly Unna wraps changed at his facility.  We will follow-up in 1 month to reassess his status of his edema.    The patient is currently in a facility where he is in a wheelchair for majority of the day.  Is instructed the daughter to try to have the facility elevate his legs when he is in the wheelchair as this will also help with his edema and wound healing.  Spoke with patient and daughter about utilizing compression socks once he is out of the wraps.  Daughter understood.  Also discussed the possibility of lymphedema pump with the patient and his daughter.  A lymphedema pump will greatly decrease the susceptibility of cellulitis and venous stasis ulcers.  The patient's daughter stated that she wished to think about it while assessing  how healing goes with the wraps.  3. Essential hypertension Continue antihypertensive medications as already ordered, these medications have been reviewed and there are no changes at this time.   4. Hyperlipidemia, unspecified hyperlipidemia type Continue statin as ordered and reviewed, no changes at this time    Current Outpatient Medications on File Prior to Visit  Medication Sig Dispense Refill  . acetaminophen (TYLENOL) 325 MG tablet Take 2 tablets (650 mg total) by mouth every 6 (six) hours as needed for mild pain (or Fever >/= 101).    [name] apixaban (ELIQUIS) 2.5 MG TABS tablet Take 1 tablet (2.5 mg total) by mouth 2 (two) times daily. 60 tablet   . ascorbic acid (VITAMIN C) 250 MG tablet Take 250 mg by mouth daily.    . feeding supplement, ENSURE ENLIVE, (ENSURE ENLIVE) LIQD Take 237 mLs by mouth 2 (two) times daily between meals. 60 Bottle 2  . felodipine (PLENDIL) 5 MG 24 hr tablet Take 1 tablet (5 mg total) by mouth daily.    . ferrous sulfate 325 (65 FE) MG tablet Take 325 mg by mouth daily.     . furosemide (LASIX) 20 MG tablet Take 1 tablet (20 mg total) by mouth 2 (two) times daily. 60 tablet 0  . glipiZIDE (GLUCOTROL) 10 MG tablet TAKE 1 TABLET (10 MG TOTAL) BY MOUTH DAILY. (Patient taking differently: Take 10 mg by mouth daily. ) 90 tablet 4  . insulin aspart (NOVOLOG) 100 UNIT/ML injection Inject 0-9 Units into the skin 3 (three) times daily with meals. 10 mL 11  . loratadine (CLARITIN) 10 MG tablet Take 10 mg by mouth daily.    [name] lovastatin (MEVACOR) 40 MG tablet TAKE 1 TABLET (40 MG TOTAL) BY MOUTH DAILY. (Patient taking differently: Take 40 mg by mouth daily. ) 90 tablet 3  . megestrol (MEGACE) 400 MG/10ML  suspension Take 10 mLs (400 mg total) by mouth 2 (two) times daily. 240 mL 0  . montelukast (SINGULAIR) 10 MG tablet TAKE 1 TABLET (10 MG TOTAL) BY MOUTH EVERY EVENING. (Patient taking differently: Take 10 mg by mouth at bedtime. ) 90 tablet 4  . Multiple Vitamin  (MULTIVITAMIN) tablet Take 1 tablet by mouth daily.    . multivitamin-lutein (OCUVITE-LUTEIN) CAPS capsule Take 1 capsule by mouth daily. 30 capsule 0  . pantoprazole (PROTONIX) 40 MG tablet Take 40 mg by mouth daily.    . polyethylene glycol (MIRALAX / GLYCOLAX) packet Take 17 g by mouth daily as needed for mild constipation.     . tamsulosin (FLOMAX) 0.4 MG CAPS capsule Take 1 capsule (0.4 mg total) by mouth daily. 30 capsule 0  . Vitamin D, Ergocalciferol, (DRISDOL) 50000 units CAPS capsule TAKE ONE CAPSULE BY MOUTH EVERY 7 DAYS (Patient taking differently: Take 50,000 Units by mouth every Saturday. ) 4 capsule 0   No current facility-administered medications on file prior to visit.     There are no Patient Instructions on file for this visit. No follow-ups on file.   Kris Hartmann, NP

## 2018-01-14 ENCOUNTER — Ambulatory Visit (INDEPENDENT_AMBULATORY_CARE_PROVIDER_SITE_OTHER): Payer: Medicare Other | Admitting: Urology

## 2018-01-14 ENCOUNTER — Encounter: Payer: Self-pay | Admitting: Urology

## 2018-01-14 ENCOUNTER — Other Ambulatory Visit: Payer: Self-pay | Admitting: Radiology

## 2018-01-14 VITALS — BP 190/109 | HR 112 | Ht 69.0 in

## 2018-01-14 DIAGNOSIS — R339 Retention of urine, unspecified: Secondary | ICD-10-CM

## 2018-01-14 LAB — BLADDER SCAN AMB NON-IMAGING: SCAN RESULT: 782

## 2018-01-14 NOTE — Progress Notes (Signed)
01/14/2018 8:44 AM   Julieanne Manson Ghrist 11-13-1930 [id number]  Referring provider: [name], MD [address] [place], [place] [address]  Chief Complaint  Patient presents with  . Urinary Retention    HPI: Patient is a 84 year old Caucasian male with a history of urinary retention who presents today for follow-up with his daughter, [name].    Seen as an inpatient consult by Dr. Jeffie Pollock on 12/18/2017 for urinary retention and scrotal edema.  A scrotal ultrasound showed a intratesticular cyst and a hydrocele but no evidence of infection.  PVR was 700 mL.  A 16 French Foley catheter was placed without difficulty.  He was discharged on tamsulosin and presents today for a voiding trial.  He is currently in a skilled nursing facility.  Prior to that hospitalization there was no history of bothersome lower urinary tract symptoms or urinary retention.  His Foley was removed on December 30, 2017.   His PVR was found to be 782 mL at today's visit.  Patient states he is urinating frequency both day and night.  He is not having suprapubic pain.   Patient denies any gross hematuria, dysuria or suprapubic/flank pain.  Patient denies any fevers, chills, nausea or vomiting.   He is receiving tamsulosin 0.4 mg daily per MAR.  PMH: Past Medical History:  Diagnosis Date  . Anemia   . CHF (congestive heart failure) ([place])   . Diabetes mellitus without complication ([place])   . Hyperlipidemia   . Hypertension   . Skin cancer     Surgical History: Past Surgical History:  Procedure Laterality Date  . BASAL CELL CARCINOMA EXCISION    . History of eye surgery Bilateral   . MOHS SURGERY  04/2011   the top of his head  . Right BKA  09/12/2010   Dr. Delana Meyer; Secondary gangrenous right LE  . TONSILLECTOMY      Home Medications:  Allergies as of 01/14/2018   No Known Allergies     Medication List        Accurate as of 01/14/18  8:44 AM. Always use your most recent med list.            acetaminophen 325 MG tablet Commonly known as:  TYLENOL Take 2 tablets (650 mg total) by mouth every 6 (six) hours as needed for mild pain (or Fever >/= 101).   apixaban 2.5 MG Tabs tablet Commonly known as:  ELIQUIS Take 1 tablet (2.5 mg total) by mouth 2 (two) times daily.   ascorbic acid 250 MG tablet Commonly known as:  VITAMIN C Take 250 mg by mouth daily.   atenolol-chlorthalidone 50-25 MG tablet Commonly known as:  TENORETIC   feeding supplement (ENSURE ENLIVE) Liqd Take 237 mLs by mouth 2 (two) times daily between meals.   felodipine 5 MG 24 hr tablet Commonly known as:  PLENDIL Take 1 tablet (5 mg total) by mouth daily.   ferrous sulfate 325 (65 FE) MG tablet Take 325 mg by mouth daily.   furosemide 20 MG tablet Commonly known as:  LASIX Take 1 tablet (20 mg total) by mouth 2 (two) times daily.   glipiZIDE 10 MG tablet Commonly known as:  GLUCOTROL TAKE 1 TABLET (10 MG TOTAL) BY MOUTH DAILY.   insulin aspart 100 UNIT/ML injection Commonly known as:  novoLOG Inject 0-9 Units into the skin 3 (three) times daily with meals.   loratadine 10 MG tablet Commonly known as:  CLARITIN Take 10 mg by mouth daily.  lovastatin 40 MG tablet Commonly known as:  MEVACOR TAKE 1 TABLET (40 MG TOTAL) BY MOUTH DAILY.   megestrol 400 MG/10ML suspension Commonly known as:  MEGACE Take 10 mLs (400 mg total) by mouth 2 (two) times daily.   montelukast 10 MG tablet Commonly known as:  SINGULAIR TAKE 1 TABLET (10 MG TOTAL) BY MOUTH EVERY EVENING.   multivitamin tablet Take 1 tablet by mouth daily.   multivitamin-lutein Caps capsule Take 1 capsule by mouth daily.   oxycodone 5 MG capsule Commonly known as:  OXY-IR Take 5 mg by mouth every 4 (four) hours as needed.   pantoprazole 40 MG tablet Commonly known as:  PROTONIX Take 40 mg by mouth daily.   polyethylene glycol packet Commonly known as:  MIRALAX / GLYCOLAX Take 17 g by mouth daily as needed for mild  constipation.   SANTYL ointment Generic drug:  collagenase Apply 1 application topically daily.   tamsulosin 0.4 MG Caps capsule Commonly known as:  FLOMAX Take 1 capsule (0.4 mg total) by mouth daily.   Vitamin D (Ergocalciferol) 50000 units Caps capsule Commonly known as:  DRISDOL TAKE ONE CAPSULE BY MOUTH EVERY 7 DAYS   Vitamin D3 50000 units Caps Take by mouth once a week.       Allergies: No Known Allergies  Family History: Family History  Problem Relation Age of Onset  . Diabetes Mother   . Arthritis Sister   . Diabetes Daughter     Social History:  reports that he has never smoked. He has never used smokeless tobacco. He reports that he does not drink alcohol or use drugs.  ROS: UROLOGY Frequent Urination?: No Hard to postpone urination?: No Burning/pain with urination?: No Get up at night to urinate?: No Leakage of urine?: No Urine stream starts and stops?: No Trouble starting stream?: No Do you have to strain to urinate?: No Blood in urine?: No Urinary tract infection?: No Sexually transmitted disease?: No Injury to kidneys or bladder?: No Painful intercourse?: No Weak stream?: No Erection problems?: No Penile pain?: No  Gastrointestinal Nausea?: No Vomiting?: No Indigestion/heartburn?: No Diarrhea?: No Constipation?: No  Constitutional Fever: No Night sweats?: No Weight loss?: No Fatigue?: No  Skin Skin rash/lesions?: No Itching?: No  Eyes Blurred vision?: No Double vision?: No  Ears/Nose/Throat Sore throat?: No Sinus problems?: No  Hematologic/Lymphatic Swollen glands?: No Easy bruising?: No  Cardiovascular Leg swelling?: No Chest pain?: No  Respiratory Cough?: No Shortness of breath?: No  Endocrine Excessive thirst?: No  Musculoskeletal Back pain?: No Joint pain?: No  Neurological Headaches?: No Dizziness?: No  Psychologic Depression?: No Anxiety?: No  Physical Exam: BP (!) 190/109 (BP Location: Left  Arm, Patient Position: Sitting, Cuff Size: Normal)   Pulse (!) 112   Ht 5\' 9"  (1.753 m)   BMI 32.34 kg/m   Constitutional: Well nourished. Alert and oriented, No acute distress. Hard of hearing.   HEENT: [place] AT, moist mucus membranes. Trachea midline, no masses. Cardiovascular: No clubbing, cyanosis, or edema. Respiratory: Normal respiratory effort, no increased work of breathing. Skin: Several basal/squamous skin lesions on scalp Lymph: No cervical or inguinal adenopathy. Neurologic: Grossly intact, no focal deficits, non ambulatory, prosthetic right leg  Psychiatric: Normal mood and affect.   Assessment & Plan:    1. History of urinary retention PVR is 782 mL   Continue tamsulosin 0.4 mg daily  Patient is very reluctant with having a Foley catheter placed at this time as he found the previous catheter extremely  painful I offered the patient the choice of an SPT placement and he is agreeable to this procedure I explained that the SPT is placed by IR under CT guidance and will require 3 serial dilations until the final SPT is placed The SPT will then need to be exchanged every 30 days either through his facility at [facility] or with [place] He and his daughter are agreeable to this procedure Schedule SPT with serial dilation   Zara Council, [facility]  [place] [address] [place], [place] [address] [phone]

## 2018-01-15 ENCOUNTER — Encounter (INDEPENDENT_AMBULATORY_CARE_PROVIDER_SITE_OTHER): Payer: Self-pay | Admitting: Nurse Practitioner

## 2018-01-18 ENCOUNTER — Telehealth: Payer: Self-pay | Admitting: Family Medicine

## 2018-01-18 NOTE — Telephone Encounter (Signed)
Amy with [facility] is trying to get pt scheduled for suprapubic tube placement but they need clearance for pt to stop taking apixaban (ELIQUIS) 2.5 MG TABS tablet and can restart the medication the day after the procedure. Amy is asking if Dr. Caryn Section can provide that clearance. Please advise. Thanks TNP

## 2018-01-18 NOTE — Telephone Encounter (Signed)
Office closed, will try again tomorrow.

## 2018-01-18 NOTE — Telephone Encounter (Signed)
Please advise 

## 2018-01-18 NOTE — Telephone Encounter (Signed)
May stop eliquis 48-72 hours before procedure and resume day after procedure.

## 2018-01-19 ENCOUNTER — Other Ambulatory Visit: Payer: Self-pay | Admitting: Physician Assistant

## 2018-01-19 ENCOUNTER — Telehealth: Payer: Self-pay | Admitting: Radiology

## 2018-01-19 ENCOUNTER — Encounter: Payer: Medicare Other | Attending: Physician Assistant | Admitting: Physician Assistant

## 2018-01-19 DIAGNOSIS — Z89511 Acquired absence of right leg below knee: Secondary | ICD-10-CM | POA: Insufficient documentation

## 2018-01-19 DIAGNOSIS — H409 Unspecified glaucoma: Secondary | ICD-10-CM | POA: Insufficient documentation

## 2018-01-19 DIAGNOSIS — E1140 Type 2 diabetes mellitus with diabetic neuropathy, unspecified: Secondary | ICD-10-CM | POA: Insufficient documentation

## 2018-01-19 DIAGNOSIS — M1990 Unspecified osteoarthritis, unspecified site: Secondary | ICD-10-CM | POA: Insufficient documentation

## 2018-01-19 DIAGNOSIS — I509 Heart failure, unspecified: Secondary | ICD-10-CM | POA: Insufficient documentation

## 2018-01-19 DIAGNOSIS — L97526 Non-pressure chronic ulcer of other part of left foot with bone involvement without evidence of necrosis: Secondary | ICD-10-CM | POA: Diagnosis not present

## 2018-01-19 DIAGNOSIS — E11621 Type 2 diabetes mellitus with foot ulcer: Secondary | ICD-10-CM | POA: Insufficient documentation

## 2018-01-19 DIAGNOSIS — E1151 Type 2 diabetes mellitus with diabetic peripheral angiopathy without gangrene: Secondary | ICD-10-CM | POA: Insufficient documentation

## 2018-01-19 DIAGNOSIS — I110 Hypertensive heart disease with heart failure: Secondary | ICD-10-CM | POA: Insufficient documentation

## 2018-01-19 DIAGNOSIS — Z8249 Family history of ischemic heart disease and other diseases of the circulatory system: Secondary | ICD-10-CM | POA: Insufficient documentation

## 2018-01-19 DIAGNOSIS — Z7984 Long term (current) use of oral hypoglycemic drugs: Secondary | ICD-10-CM | POA: Insufficient documentation

## 2018-01-19 DIAGNOSIS — I890 Lymphedema, not elsewhere classified: Secondary | ICD-10-CM | POA: Insufficient documentation

## 2018-01-19 DIAGNOSIS — L97509 Non-pressure chronic ulcer of other part of unspecified foot with unspecified severity: Secondary | ICD-10-CM

## 2018-01-19 NOTE — Telephone Encounter (Signed)
Lm with triage.

## 2018-01-19 NOTE — Telephone Encounter (Signed)
Notified daughter, [name], of suprapubic tube placement schedule 01/25/2018 at 9:00 & to hold Eliquis beginning 01/23/2018 & resume on 01/26/2018. Questions answered. Daughter voices understanding. Also notified Allie at [facility] of same and that patient needs to be npo after midnight prior to procedure. Allie voices understanding & states will place orders for this.

## 2018-01-20 NOTE — Progress Notes (Signed)
TIWAN, SCHNITKER ([id number]) Visit Report for 01/19/2018 Abuse/Suicide Risk Screen Details Patient Name: Miguel Torres, Miguel Torres Date of Service: 01/19/2018 10:30 AM Medical Record Number: [id number] Patient Account Number: [id number] Date of Birth/Sex: 1930/12/10 (84 y.o. Male) Treating RN: Cornell Barman Primary Care Breeanna Galgano: Lelon Huh Other Clinician: Referring Angele Wiemann: Karle Barr Treating Nickolus Wadding/Extender: Melburn Hake, HOYT Weeks in Treatment: 0 Abuse/Suicide Risk Screen Items Answer ABUSE/SUICIDE RISK SCREEN: Has anyone close to you tried to hurt or harm you recentlyo No Do you feel uncomfortable with anyone in your familyo No Has anyone forced you do things that you didnot want to doo No Do you have any thoughts of harming yourselfo No Patient displays signs or symptoms of abuse and/or neglect. No Electronic Signature(s) Signed: 01/19/2018 11:36:56 AM By: Gretta Cool, BSN, RN, CWS, Kim RN, BSN Entered By: Gretta Cool, BSN, RN, CWS, Kim on 01/19/2018 10:59:12 Miguel Torres ([id number]) -------------------------------------------------------------------------------- Activities of Daily Living Details Patient Name: Miguel Torres, Miguel Torres Date of Service: 01/19/2018 10:30 AM Medical Record Number: [id number] Patient Account Number: [id number] Date of Birth/Sex: 28-Jul-1930 (84 y.o. Male) Treating RN: Cornell Barman Primary Care Alishia Lebo: Lelon Huh Other Clinician: Referring Haruki Arnold: Karle Barr Treating Ayla Dunigan/Extender: Melburn Hake, HOYT Weeks in Treatment: 0 Activities of Daily Living Items Answer Activities of Daily Living (Please select one for each item) Drive Automobile Not Able Take Medications Need Assistance Use Telephone Need Assistance Care for Appearance Need Assistance Use Toilet Need Assistance Bath / Shower Need Assistance Dress Self Need Assistance Feed Self Need Assistance Walk Need Assistance Get In / Out Bed Need Assistance Housework Need  Assistance Prepare Meals Need Assistance Handle Money Need Assistance Shop for Self Need Assistance Electronic Signature(s) Signed: 01/19/2018 11:36:56 AM By: Gretta Cool, BSN, RN, CWS, Kim RN, BSN Entered By: Gretta Cool, BSN, RN, CWS, Kim on 01/19/2018 10:59:31 Miguel Torres ([id number]) -------------------------------------------------------------------------------- Education Assessment Details Patient Name: Miguel Torres Date of Service: 01/19/2018 10:30 AM Medical Record Number: [id number] Patient Account Number: [id number] Date of Birth/Sex: [birth year] (84 y.o. Male) Treating RN: Cornell Barman Primary Care Arlen Legendre: Lelon Huh Other Clinician: Referring Ashaz Robling: Karle Barr Treating Shyanna Klingel/Extender: Sharalyn Ink in Treatment: 0 Primary Learner Assessed: Patient Learning Preferences/Education Level/Primary Language Learning Preference: Explanation Highest Education Level: Grade School Preferred Language: English Cognitive Barrier Assessment/Beliefs Language Barrier: No Translator Needed: No Memory Deficit: No Emotional Barrier: No Physical Barrier Assessment Impaired Vision: Yes Glasses Impaired Hearing: Yes mild hearing loss Decreased Hand dexterity: No Knowledge/Comprehension Assessment Knowledge Level: Low Comprehension Level: Low Ability to understand written Low instructions: Ability to understand verbal Low instructions: Motivation Assessment Anxiety Level: Calm Cooperation: Cooperative Education Importance: Acknowledges Need Interest in Health Problems: Uninterested Perception: Coherent Willingness to Engage in Self- Low Management Activities: Readiness to Engage in Self- Low Management Activities: Engineer, maintenance) Signed: 01/19/2018 11:36:56 AM By: Gretta Cool, BSN, RN, CWS, Kim RN, BSN Entered By: Gretta Cool, BSN, RN, CWS, Kim on 01/19/2018 11:00:07 Miguel Torres  ([id number]) -------------------------------------------------------------------------------- Fall Risk Assessment Details Patient Name: Miguel Torres Date of Service: 01/19/2018 10:30 AM Medical Record Number: [id number] Patient Account Number: [id number] Date of Birth/Sex: [birth year] (84 y.o. Male) Treating RN: Cornell Barman Primary Care Junette Bernat: Lelon Huh Other Clinician: Referring Peggie Hornak: Karle Barr Treating Arwen Haseley/Extender: Melburn Hake, HOYT Weeks in Treatment: 0 Fall Risk Assessment Items Have you had 2 or more falls in the last 12 monthso 0 No Have you had any fall that resulted in injury in the last 12 monthso 0 No FALL RISK ASSESSMENT: History of falling - immediate or  within 3 months 0 No Secondary diagnosis 0 No Ambulatory aid None/bed rest/wheelchair/nurse 0 No Crutches/cane/walker 15 Yes Furniture 0 No IV Access/Saline Lock 0 No Gait/Training Normal/bed rest/immobile 0 No Weak 10 Yes Impaired 0 No Mental Status Oriented to own ability 0 No Electronic Signature(s) Signed: 01/19/2018 11:36:56 AM By: Gretta Cool, BSN, RN, CWS, Kim RN, BSN Entered By: Gretta Cool, BSN, RN, CWS, Kim on 01/19/2018 11:00:32 Miguel Torres ([id number]) -------------------------------------------------------------------------------- Foot Assessment Details Patient Name: Miguel Torres Date of Service: 01/19/2018 10:30 AM Medical Record Number: [id number] Patient Account Number: [id number] Date of Birth/Sex: 04-04-1931 (84 y.o. Male) Treating RN: Cornell Barman Primary Care Louna Rothgeb: Lelon Huh Other Clinician: Referring Cornisha Zetino: Karle Barr Treating Princeston Blizzard/Extender: Melburn Hake, HOYT Weeks in Treatment: 0 Foot Assessment Items Site Locations + = Sensation present, - = Sensation absent, C = Callus, U = Ulcer R = Redness, W = Warmth, M = Maceration, PU = Pre-ulcerative lesion F = Fissure, S = Swelling, D = Dryness Assessment Right: Left: Other Deformity: No No Prior  Foot Ulcer: No No Prior Amputation: No No Charcot Joint: No No Ambulatory Status: Ambulatory With Help Assistance Device: Walker Gait: Steady Notes Patient has right BKA Electronic Signature(s) Signed: 01/19/2018 11:36:56 AM By: Gretta Cool, BSN, RN, CWS, Kim RN, BSN Entered By: Gretta Cool, BSN, RN, CWS, Kim on 01/19/2018 11:03:49 Miguel Torres ([id number]) -------------------------------------------------------------------------------- Nutrition Risk Assessment Details Patient Name: Miguel Torres Date of Service: 01/19/2018 10:30 AM Medical Record Number: [id number] Patient Account Number: [id number] Date of Birth/Sex: 1930-07-22 (84 y.o. Male) Treating RN: Cornell Barman Primary Care Janelle Culton: Lelon Huh Other Clinician: Referring Jonh Mcqueary: Karle Barr Treating Laporche Martelle/Extender: STONE III, HOYT Weeks in Treatment: 0 Height (in): 68 Weight (lbs): 216 Body Mass Index (BMI): 32.8 Nutrition Risk Assessment Items NUTRITION RISK SCREEN: I have an illness or condition that made me change the kind and/or amount of 0 No food I eat I eat fewer than two meals per day 0 No I eat few fruits and vegetables, or milk products 0 No I have three or more drinks of beer, liquor or wine almost every day 0 No I have tooth or mouth problems that make it hard for me to eat 0 No I don't always have enough money to buy the food I need 0 No I eat alone most of the time 0 No I take three or more different prescribed or over-the-counter drugs a day 1 Yes Without wanting to, I have lost or gained 10 pounds in the last six months 2 Yes I am not always physically able to shop, cook and/or feed myself 0 No Nutrition Protocols Good Risk Protocol Provide education on elevated blood sugars and Moderate Risk Protocol 0 impact on wound healing, as applicable Electronic Signature(s) Signed: 01/19/2018 11:36:56 AM By: Gretta Cool, BSN, RN, CWS, Kim RN, BSN Entered By: Gretta Cool, BSN, RN, CWS, Kim on 01/19/2018  11:00:47

## 2018-01-21 NOTE — Progress Notes (Signed)
ZYAD, BOOMER ([id number]) Visit Report for 01/19/2018 Chief Complaint Document Details Patient Name: Miguel Torres, Miguel Torres Date of Service: 01/19/2018 10:30 AM Medical Record Number: [id number] Patient Account Number: [id number] Date of Birth/Sex: 1930-07-21 (84 y.o. Male) Treating RN: Montey Hora Primary Care Provider: Lelon Huh Other Clinician: Referring Provider: Karle Barr Treating Provider/Extender: Melburn Hake, Finlee Milo Weeks in Treatment: 0 Information Obtained from: Patient Chief Complaint Left foot ulcer Electronic Signature(s) Signed: 01/19/2018 4:58:18 PM By: Worthy Keeler PA-C Entered By: Worthy Keeler on 01/19/2018 15:03:29 Miguel Torres ([id number]) -------------------------------------------------------------------------------- Debridement Details Patient Name: Miguel Torres Date of Service: 01/19/2018 10:30 AM Medical Record Number: [id number] Patient Account Number: [id number] Date of Birth/Sex: 04-Mar-1931 (84 y.o. Male) Treating RN: Montey Hora Primary Care Provider: Lelon Huh Other Clinician: Referring Provider: Karle Barr Treating Provider/Extender: Melburn Hake, Aleea Hendry Weeks in Treatment: 0 Debridement Performed for Wound #1 Left,Lateral Foot Assessment: Performed By: Physician STONE III, Toney Lizaola E., PA-C Debridement Type: Debridement Severity of Tissue Pre Bone involvement without necrosis Debridement: Level of Consciousness (Pre- Awake and Alert procedure): Pre-procedure Verification/Time Yes - 11:35 Out Taken: Start Time: 11:35 Pain Control: Lidocaine 4% Topical Solution Total Area Debrided (L x W): 4 (cm) x 0.7 (cm) = 2.8 (cm) Tissue and other material Viable, Non-Viable, Callus, Slough, Subcutaneous, Fibrin/Exudate, Slough debrided: Level: Skin/Subcutaneous Tissue Debridement Description: Excisional Instrument: Curette Bleeding: Minimum Hemostasis Achieved: Pressure End Time: 11:40 Procedural Pain: 0 Post  Procedural Pain: 0 Response to Treatment: Procedure was tolerated well Level of Consciousness Awake and Alert (Post-procedure): Post Debridement Measurements of Total Wound Length: (cm) 4 Width: (cm) 7 Depth: (cm) 1.4 Volume: (cm) 30.788 Character of Wound/Ulcer Post Debridement: Improved Severity of Tissue Post Debridement: Bone involvement without necrosis Post Procedure Diagnosis Same as Pre-procedure Electronic Signature(s) Signed: 01/19/2018 4:58:18 PM By: Worthy Keeler PA-C Signed: 01/19/2018 5:04:25 PM By: Montey Hora Entered By: Montey Hora on 01/19/2018 11:40:19 Miguel Torres ([id number]) -------------------------------------------------------------------------------- HPI Details Patient Name: Miguel Torres Date of Service: 01/19/2018 10:30 AM Medical Record Number: [id number] Patient Account Number: [id number] Date of Birth/Sex: 02/14/1931 (84 y.o. Male) Treating RN: Montey Hora Primary Care Provider: Lelon Huh Other Clinician: Referring Provider: Karle Barr Treating Provider/Extender: STONE III, Evana Runnels Weeks in Treatment: 0 History of Present Illness Associated Signs and Symptoms: Patient has a history of diabetes mellitus type II, lymphedema, a right below knee amputation which was performed 10 years ago, and proof of vascular disease which is managed by [facility], Dr. dew HPI Description: 01/19/18 on evaluation today patient presents for initial evaluation and clinic due to issues that he has been having with a left lateral foot ulcer which has been present for at minimum two months although this may have actually been longer before the family knew of the wound. Currently he has been seen [facility] where they have performed arterial studies. I did have those for review today which did show that he has mild left lower extremity arterial disease with a abnormal left toe brachial index. His ABI was 0.93 with  a TBI of 0.26. Upon further evaluation it appears that the patient actually was recommended to have an angiogram by Dr. dew. This was due to the fact that there was concerned about the low TBI and the fact that he may not be perfusion all the way down into his foot. With that being said the final decision as to whether or not to proceed with this has not been made as of yet  according to the patient's daughter who was present for evaluation at this time today. The patient has no evidence of infection at this time. With that being said there is bone exposed in the base of the wound unfortunately. This obviously may indicate a more significant issue underlying just the open wound. Other than that he actually appears to have a significant amount of epithelialization noted which is excellent news. No fevers, chills, nausea, or vomiting noted at this time. He has had an x-ray which revealed no evidence of osteomyelitis this was performed July 2019. No MRI has been performed at this point. Electronic Signature(s) Signed: 01/19/2018 4:58:18 PM By: Worthy Keeler PA-C Entered By: Worthy Keeler on 01/19/2018 15:07:27 Miguel Torres ([id number]) -------------------------------------------------------------------------------- Physical Exam Details Patient Name: Miguel Torres Date of Service: 01/19/2018 10:30 AM Medical Record Number: [id number] Patient Account Number: [id number] Date of Birth/Sex: 02-13-1931 (84 y.o. Male) Treating RN: Montey Hora Primary Care Provider: Lelon Huh Other Clinician: Referring Provider: Karle Barr Treating Provider/Extender: STONE III, Ligia Duguay Weeks in Treatment: 0 Constitutional patient is hypertensive.. pulse regular and within target range for patient.[name] respirations regular, non-labored and within target range for patient.[name] temperature within target range for patient.. Well-nourished and well-hydrated in no acute distress. Eyes conjunctiva clear no  eyelid edema noted. pupils equal round and reactive to light and accommodation. Ears, Nose, Mouth, and Throat no gross abnormality of ear auricles or external auditory canals. patient has hearing loss. mucus membranes moist. Respiratory normal breathing without difficulty. clear to auscultation bilaterally. Cardiovascular regular rate and rhythm with normal S1, S2. Faint dorsalis pedis/posterior tibialis pulses. no clubbing, cyanosis, significant edema, <3 sec cap refill. Gastrointestinal (GI) soft, non-tender, non-distended, +BS. no ventral hernia noted. Musculoskeletal Patient unable to walk without assistance. no significant deformity or arthritic changes, no loss or range of motion, no clubbing. Psychiatric this patient is able to make decisions and demonstrates good insight into disease process. Alert and Oriented x 3. pleasant and cooperative. Notes On evaluation today patient actually appears to show evidence of epithelialization noted over the majority of the open wound area. Fortunately there does not appear to be any evidence of infection which is good news. The patient has been tolerating the dressing changes without complication. With that being said I don't believe Annitta Needs is likely gonna be the most appropriate thing to continue with at this point as far as dressings are concerned. Fortunately there's no evidence of obvious necrosis of bone although there was bone noted on palpation/probing into the base of the wound. Sharp debridement was performed to remove dead skin from around the edge of the wound that was trapping fluid as well as removing slough from the surface of the wing. No wound culture obtained. Electronic Signature(s) Signed: 01/19/2018 4:58:18 PM By: Worthy Keeler PA-C Entered By: Worthy Keeler on 01/19/2018 15:13:10 Miguel Torres ([id number]) -------------------------------------------------------------------------------- Physician Orders  Details Patient Name: Miguel Torres Date of Service: 01/19/2018 10:30 AM Medical Record Number: [id number] Patient Account Number: [id number] Date of Birth/Sex: 09-06-1930 (84 y.o. Male) Treating RN: Montey Hora Primary Care Provider: Lelon Huh Other Clinician: Referring Provider: Karle Barr Treating Provider/Extender: Melburn Hake, Zeba Luby Weeks in Treatment: 0 Verbal / Phone Orders: No Diagnosis Coding Wound Cleansing Wound #1 Left,Lateral Foot o Cleanse wound with mild soap and water Anesthetic (add to Medication List) Wound #1 Left,Lateral Foot o Topical Lidocaine 4% cream applied to wound bed prior to debridement (In Clinic Only). Primary Wound Dressing Wound #1 Left,Lateral Foot   o Silver Collagen Secondary Dressing Wound #1 Left,Lateral Foot o ABD pad Dressing Change Frequency Wound #1 Left,Lateral Foot o Change Dressing Monday, Wednesday, Friday Follow-up Appointments Wound #1 Left,Lateral Foot o Return Appointment in 1 week. Edema Control Wound #1 Left,Lateral Foot o Kerlix and Coban - Left Lower Extremity - DO NOT WRAP TOO TIGHT Radiology o MRI, lower extremity without contrast Electronic Signature(s) Signed: 01/19/2018 4:58:18 PM By: Worthy Keeler PA-C Signed: 01/19/2018 5:04:25 PM By: Montey Hora Entered By: Montey Hora on 01/19/2018 13:29:56 Miguel Torres ([id number]) -------------------------------------------------------------------------------- Problem List Details Patient Name: Miguel Torres Date of Service: 01/19/2018 10:30 AM Medical Record Number: [id number] Patient Account Number: [id number] Date of Birth/Sex: 27-Mar-1931 (84 y.o. Male) Treating RN: Montey Hora Primary Care Provider: Lelon Huh Other Clinician: Referring Provider: Karle Barr Treating Provider/Extender: Melburn Hake, Estephany Perot Weeks in Treatment: 0 Active Problems ICD-10 Evaluated Encounter Code Description Active Date Today  Diagnosis E11.621 Type 2 diabetes mellitus with foot ulcer 01/19/2018 No Yes L97.526 Non-pressure chronic ulcer of other part of left foot with bone 01/19/2018 No Yes involvement without evidence of necrosis I89.0 Lymphedema, not elsewhere classified 01/19/2018 No Yes I73.89 Other specified peripheral vascular diseases 01/19/2018 No Yes Z89.511 Acquired absence of right leg below knee 01/19/2018 No Yes Inactive Problems Resolved Problems Electronic Signature(s) Signed: 01/19/2018 4:58:18 PM By: Worthy Keeler PA-C Entered By: Worthy Keeler on 01/19/2018 15:03:09 Miguel Torres ([id number]) -------------------------------------------------------------------------------- Progress Note Details Patient Name: Miguel Torres Date of Service: 01/19/2018 10:30 AM Medical Record Number: [id number] Patient Account Number: [id number] Date of Birth/Sex: Nov 24, 1930 (84 y.o. Male) Treating RN: Montey Hora Primary Care Provider: Lelon Huh Other Clinician: Referring Provider: Karle Barr Treating Provider/Extender: Melburn Hake, Jaya Lapka Weeks in Treatment: 0 Subjective Chief Complaint Information obtained from Patient Left foot ulcer History of Present Illness (HPI) The following HPI elements were documented for the patient's wound: Associated Signs and Symptoms: Patient has a history of diabetes mellitus type II, lymphedema, a right below knee amputation which was performed 10 years ago, and proof of vascular disease which is managed by [facility], Dr. dew 01/19/18 on evaluation today patient presents for initial evaluation and clinic due to issues that he has been having with a left lateral foot ulcer which has been present for at minimum two months although this may have actually been longer before the family knew of the wound. Currently he has been seen [facility] where they have performed arterial studies. I did have those for review today which did  show that he has mild left lower extremity arterial disease with a abnormal left toe brachial index. His ABI was 0.93 with a TBI of 0.26. Upon further evaluation it appears that the patient actually was recommended to have an angiogram by Dr. dew. This was due to the fact that there was concerned about the low TBI and the fact that he may not be perfusion all the way down into his foot. With that being said the final decision as to whether or not to proceed with this has not been made as of yet according to the patient's daughter who was present for evaluation at this time today. The patient has no evidence of infection at this time. With that being said there is bone exposed in the base of the wound unfortunately. This obviously may indicate a more significant issue underlying just the open wound. Other than that he actually appears to have a significant amount of epithelialization noted  which is excellent news. No fevers, chills, nausea, or vomiting noted at this time. He has had an x-ray which revealed no evidence of osteomyelitis this was performed July 2019. No MRI has been performed at this point. Wound History Patient presents with 1 open wound that has been present for approximately 6 months. Patient has been treating wound in the following manner: Santyl. Laboratory tests have not been performed in the last month. Patient reportedly has not tested positive for an antibiotic resistant organism. Patient reportedly has not tested positive for osteomyelitis. Patient reportedly has had testing performed to evaluate circulation in the legs. Patient History Information obtained from Patient, Chart. Allergies No Known Drug Allergies Family History Diabetes - Mother, Heart Disease - Mother, Hypertension - Mother, No family history of Kidney Disease, Lung Disease, Seizures, Stroke, Thyroid Problems, Tuberculosis. Social History Never smoker, Marital Status - Married, Alcohol Use - Never, Drug  Use - No History, Caffeine Use - Daily. Medical History Miguel Torres, Miguel Torres ([id number]) Eyes Patient has history of Glaucoma Denies history of Cataracts, Optic Neuritis Ear/Nose/Mouth/Throat Denies history of Chronic sinus problems/congestion, Middle ear problems Hematologic/Lymphatic Denies history of Anemia, Hemophilia, Human Immunodeficiency Virus, Lymphedema, Sickle Cell Disease Respiratory Denies history of Aspiration, Asthma, Chronic Obstructive Pulmonary Disease (COPD), Pneumothorax, Sleep Apnea, Tuberculosis Cardiovascular Patient has history of Congestive Heart Failure, Hypertension Denies history of Angina, Arrhythmia, Coronary Artery Disease, Deep Vein Thrombosis, Hypotension, Myocardial Infarction, Peripheral Arterial Disease, Peripheral Venous Disease, Phlebitis, Vasculitis Gastrointestinal Denies history of Cirrhosis , Colitis, Crohn s, Hepatitis A, Hepatitis B, Hepatitis C Endocrine Patient has history of Type II Diabetes Denies history of Type I Diabetes Genitourinary Denies history of End Stage Renal Disease Immunological Denies history of Lupus Erythematosus, Raynaud s, Scleroderma Integumentary (Skin) Denies history of History of Burn, History of pressure wounds Musculoskeletal Patient has history of Gout, Osteoarthritis Denies history of Rheumatoid Arthritis, Osteomyelitis Neurologic Patient has history of Neuropathy Denies history of Dementia, Quadriplegia, Paraplegia, Seizure Disorder Oncologic Denies history of Received Chemotherapy, Received Radiation Psychiatric Denies history of Anorexia/bulimia, Confinement Anxiety Patient is treated with Oral Agents. Blood sugar is tested. Review of Systems (ROS) Constitutional Symptoms (General Health) Complains or has symptoms of Marked Weight Change. Eyes Complains or has symptoms of Glasses / Contacts. Denies complaints or symptoms of Dry Eyes, Vision Changes. Ear/Nose/Mouth/Throat The patient has no  complaints or symptoms. Hematologic/Lymphatic The patient has no complaints or symptoms. Respiratory Complains or has symptoms of Shortness of Breath. Denies complaints or symptoms of Chronic or frequent coughs. Cardiovascular Complains or has symptoms of LE edema. Denies complaints or symptoms of Chest pain. Gastrointestinal The patient has no complaints or symptoms. Endocrine Denies complaints or symptoms of Hepatitis, Thyroid disease, Polydypsia (Excessive Thirst). Genitourinary Complains or has symptoms of Incontinence/dribbling. Miguel Torres, Miguel Torres ([id number]) Denies complaints or symptoms of Kidney failure/ Dialysis, Superpubic catheter to be placed next week Integumentary (Skin) Complains or has symptoms of Wounds, Bleeding or bruising tendency. Denies complaints or symptoms of Breakdown, Swelling. Musculoskeletal The patient has no complaints or symptoms. Neurologic The patient has no complaints or symptoms. Oncologic The patient has no complaints or symptoms. Psychiatric The patient has no complaints or symptoms. Objective Constitutional patient is hypertensive.. pulse regular and within target range for patient.[name] respirations regular, non-labored and within target range for patient.[name] temperature within target range for patient.. Well-nourished and well-hydrated in no acute distress. Vitals Time Taken: 10:40 AM, Height: 68 in, Weight: 216 lbs, BMI: 32.8, Temperature: 98.1 F, Pulse: 109 bpm, Respiratory Rate: 18 breaths/min,  Blood Pressure: 169/88 mmHg. Eyes conjunctiva clear no eyelid edema noted. pupils equal round and reactive to light and accommodation. Ears, Nose, Mouth, and Throat no gross abnormality of ear auricles or external auditory canals. patient has hearing loss. mucus membranes moist. Respiratory normal breathing without difficulty. clear to auscultation bilaterally. Cardiovascular regular rate and rhythm with normal S1, S2. Faint dorsalis  pedis/posterior tibialis pulses. no clubbing, cyanosis, significant edema, Gastrointestinal (GI) soft, non-tender, non-distended, +BS. no ventral hernia noted. Musculoskeletal Patient unable to walk without assistance. no significant deformity or arthritic changes, no loss or range of motion, no clubbing. Psychiatric this patient is able to make decisions and demonstrates good insight into disease process. Alert and Oriented x 3. pleasant and cooperative. General Notes: On evaluation today patient actually appears to show evidence of epithelialization noted over the majority of the open wound area. Fortunately there does not appear to be any evidence of infection which is good news. The patient has been tolerating the dressing changes without complication. With that being said I don't believe Annitta Needs is likely gonna be the most appropriate thing to continue with at this point as far as dressings are concerned. Fortunately there's no evidence of TYHEEM, BOUGHNER. ([id number]) obvious necrosis of bone although there was bone noted on palpation/probing into the base of the wound. Sharp debridement was performed to remove dead skin from around the edge of the wound that was trapping fluid as well as removing slough from the surface of the wing. No wound culture obtained. Integumentary (Hair, Skin) Wound #1 status is Open. Original cause of wound was Gradually Appeared. The wound is located on the Left,Lateral Foot. The wound measures 4cm length x 0.7cm width x 1cm depth; 2.199cm^2 area and 2.199cm^3 volume. There is Fat Layer (Subcutaneous Tissue) Exposed exposed. There is no tunneling or undermining noted. There is a large amount of sanguinous drainage noted. The wound margin is indistinct and nonvisible. There is small (1-33%) red granulation within the wound bed. There is a large (67-100%) amount of necrotic tissue within the wound bed including Adherent Slough. The periwound skin appearance  exhibited: Induration. The periwound skin appearance did not exhibit: Callus, Crepitus, Excoriation, Rash, Scarring, Dry/Scaly, Maceration, Atrophie Blanche, Cyanosis, Ecchymosis, Hemosiderin Staining, Mottled, Pallor, Rubor, Erythema. Periwound temperature was noted as No Abnormality. The periwound has tenderness on palpation. Assessment Active Problems ICD-10 Type 2 diabetes mellitus with foot ulcer Non-pressure chronic ulcer of other part of left foot with bone involvement without evidence of necrosis Lymphedema, not elsewhere classified Other specified peripheral vascular diseases Acquired absence of right leg below knee Procedures Wound #1 Pre-procedure diagnosis of Wound #1 is a Diabetic Wound/Ulcer of the Lower Extremity located on the Left,Lateral Foot .Severity of Tissue Pre Debridement is: Bone involvement without necrosis. There was a Excisional Skin/Subcutaneous Tissue Debridement with a total area of 2.8 sq cm performed by STONE III, Destiney Sanabia E., PA-C. With the following instrument(s): Curette to remove Viable and Non-Viable tissue/material. Material removed includes Callus, Subcutaneous Tissue, Slough, and Fibrin/Exudate after achieving pain control using Lidocaine 4% Topical Solution. No specimens were taken. A time out was conducted at 11:35, prior to the start of the procedure. A Minimum amount of bleeding was controlled with Pressure. The procedure was tolerated well with a pain level of 0 throughout and a pain level of 0 following the procedure. Post Debridement Measurements: 4cm length x 7cm width x 1.4cm depth; 30.788cm^3 volume. Character of Wound/Ulcer Post Debridement is improved. Severity of Tissue Post Debridement is: Bone  involvement without necrosis. Post procedure Diagnosis Wound #1: Same as Pre-Procedure Plan Wound Cleansing: Wound #1 Left,Lateral Foot: Cleanse wound with mild soap and water Miguel Torres, Miguel Torres ([id number]) Anesthetic (add to Medication  List): Wound #1 Left,Lateral Foot: Topical Lidocaine 4% cream applied to wound bed prior to debridement (In Clinic Only). Primary Wound Dressing: Wound #1 Left,Lateral Foot: Silver Collagen Secondary Dressing: Wound #1 Left,Lateral Foot: ABD pad Dressing Change Frequency: Wound #1 Left,Lateral Foot: Change Dressing Monday, Wednesday, Friday Follow-up Appointments: Wound #1 Left,Lateral Foot: Return Appointment in 1 week. Edema Control: Wound #1 Left,Lateral Foot: Kerlix and Coban - Left Lower Extremity - DO NOT WRAP TOO TIGHT Radiology ordered were: MRI, lower extremity without contrast At this point the patient actually seems to be doing rather well in general although I am concerned about the possibility of there being a deeper infection as far as the foot is concerned. An x-ray has been obtained I did review this today that was negative for any evidence of acute or chronic osteomyelitis. With that being said as I explained to the patient and his daughter today that does not rule out the possibility of osteomyelitis and I definitely think that we still need to further evaluate this possibility. They are definitely in agreement with the plan. Subsequently I'm gonna recommend that we go ahead and proceed with the MRI and we will initiate the above wound care measures over the next weeks. Of time. Patient and daughter are in agreement the plan. Please see above for specific wound care orders. We will see patient for re-evaluation in 1 week(s) here in the clinic. If anything worsens or changes patient will contact our office for additional recommendations. Electronic Signature(s) Signed: 01/19/2018 4:58:18 PM By: Worthy Keeler PA-C Entered By: Worthy Keeler on 01/19/2018 15:15:05 Miguel Torres ([id number]) -------------------------------------------------------------------------------- ROS/PFSH Details Patient Name: Miguel Torres Date of Service: 01/19/2018 10:30  AM Medical Record Number: [id number] Patient Account Number: [id number] Date of Birth/Sex: 1930/12/01 (84 y.o. Male) Treating RN: Cornell Barman Primary Care Provider: Lelon Huh Other Clinician: Referring Provider: Karle Barr Treating Provider/Extender: Melburn Hake, Cherine Drumgoole Weeks in Treatment: 0 Information Obtained From Patient Chart Wound History Do you currently have one or more open woundso Yes How many open wounds do you currently haveo 1 Approximately how long have you had your woundso 6 months How have you been treating your wound(s) until nowo Santyl Has your wound(s) ever healed and then re-openedo No Have you had any lab work done in the past montho No Have you tested positive for an antibiotic resistant organism (MRSA, VRE)o No Have you tested positive for osteomyelitis (bone infection)o No Have you had any tests for circulation on your legso Yes Constitutional Symptoms (General Health) Complaints and Symptoms: Positive for: Marked Weight Change Eyes Complaints and Symptoms: Positive for: Glasses / Contacts Negative for: Dry Eyes; Vision Changes Medical History: Positive for: Glaucoma Negative for: Cataracts; Optic Neuritis Respiratory Complaints and Symptoms: Positive for: Shortness of Breath Negative for: Chronic or frequent coughs Medical History: Negative for: Aspiration; Asthma; Chronic Obstructive Pulmonary Disease (COPD); Pneumothorax; Sleep Apnea; Tuberculosis Cardiovascular Complaints and Symptoms: Positive for: LE edema Negative for: Chest pain Medical History: Positive for: Congestive Heart Failure; Hypertension Negative for: Angina; Arrhythmia; Coronary Artery Disease; Deep Vein Thrombosis; Hypotension; Myocardial Infarction; Peripheral Arterial Disease; Peripheral Venous Disease; Phlebitis; Vasculitis Miguel Torres, Miguel Torres ([id number]) Endocrine Complaints and Symptoms: Negative for: Hepatitis; Thyroid disease; Polydypsia (Excessive Thirst) Medical  History: Positive for: Type II Diabetes Negative for: Type  I Diabetes Time with diabetes: 10 years Treated with: Oral agents Blood sugar tested every day: Yes Tested : Genitourinary Complaints and Symptoms: Positive for: Incontinence/dribbling Negative for: Kidney failure/ Dialysis Review of System Notes: Superpubic catheter to be placed next week Medical History: Negative for: End Stage Renal Disease Integumentary (Skin) Complaints and Symptoms: Positive for: Wounds; Bleeding or bruising tendency Negative for: Breakdown; Swelling Medical History: Negative for: History of Burn; History of pressure wounds Ear/Nose/Mouth/Throat Complaints and Symptoms: No Complaints or Symptoms Medical History: Negative for: Chronic sinus problems/congestion; Middle ear problems Hematologic/Lymphatic Complaints and Symptoms: No Complaints or Symptoms Medical History: Negative for: Anemia; Hemophilia; Human Immunodeficiency Virus; Lymphedema; Sickle Cell Disease Gastrointestinal Complaints and Symptoms: No Complaints or Symptoms Medical History: Negative for: Cirrhosis ; Colitis; Crohnos; Hepatitis A; Hepatitis B; Hepatitis C Immunological Miguel Torres, Miguel Torres ([id number]) Medical History: Negative for: Lupus Erythematosus; Raynaudos; Scleroderma Musculoskeletal Complaints and Symptoms: No Complaints or Symptoms Medical History: Positive for: Gout; Osteoarthritis Negative for: Rheumatoid Arthritis; Osteomyelitis Neurologic Complaints and Symptoms: No Complaints or Symptoms Medical History: Positive for: Neuropathy Negative for: Dementia; Quadriplegia; Paraplegia; Seizure Disorder Oncologic Complaints and Symptoms: No Complaints or Symptoms Medical History: Negative for: Received Chemotherapy; Received Radiation Psychiatric Complaints and Symptoms: No Complaints or Symptoms Medical History: Negative for: Anorexia/bulimia; Confinement Anxiety HBO Extended History  Items Eyes: Glaucoma Immunizations Pneumococcal Vaccine: Received Pneumococcal Vaccination: Yes Implantable Devices Family and Social History Diabetes: Yes - Mother; Heart Disease: Yes - Mother; Hypertension: Yes - Mother; Kidney Disease: No; Lung Disease: No; Seizures: No; Stroke: No; Thyroid Problems: No; Tuberculosis: No; Never smoker; Marital Status - Married; Alcohol Use: Never; Drug Use: No History; Caffeine Use: Daily; Advanced Directives: Yes; Living Will: Yes (Not Provided); Medical Power of Attorney: Yes (Copy provided) Electronic Signature(s) Signed: 01/19/2018 11:36:56 AM By: Gretta Cool, BSN, RN, CWS, Kim RN, BSN Signed: 01/19/2018 4:58:18 PM By: Worthy Keeler PA-C Entered By: Gretta Cool, BSN, RN, CWS, Kim on 01/19/2018 10:58:49 Miguel Torres ([id number]) Miguel Torres, Miguel Torres ([id number]) -------------------------------------------------------------------------------- [place] Details Patient Name: Miguel Torres Date of Service: 01/19/2018 Medical Record Number: [id number] Patient Account Number: [id number] Date of Birth/Sex: Jun 12, 1930 (84 y.o. 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 I89.0 Lymphedema, not elsewhere classified I73.89 Other specified peripheral vascular diseases Z89.511 Acquired absence of right leg below knee Facility Procedures CPT4: Description Modifier Quantity Code 67591638 99213 - WOUND CARE VISIT-LEV 3 EST PT 1 CPT4: 46659935 11042 - DEB SUBQ TISSUE 20 SQ CM/< 1 ICD-10 Diagnosis Description L97.526 Non-pressure chronic ulcer of other part of left foot with bone involvement without evidence  of necrosis Physician Procedures CPT4: Description Modifier Quantity Code 7017793 90300 - WC PHYS LEVEL 4 - NEW PT 25 1 ICD-10 Diagnosis Description E11.621 Type 2 diabetes mellitus with foot ulcer L97.526 Non-pressure chronic ulcer of other part of left foot with bone involvement  without evidence of necrosis I89.0 Lymphedema, not elsewhere classified I73.89 Other specified peripheral vascular diseases CPT4: 9233007 62263 - WC PHYS SUBQ TISS 20 SQ CM 1 ICD-10 Diagnosis Description 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 

## 2018-01-22 NOTE — Progress Notes (Signed)
BUTCH, OTTERSON ([id number]) Visit Report for 01/19/2018 Allergy List Details Patient Name: Miguel Torres, Miguel Torres Date of Service: 01/19/2018 10:30 AM Medical Record Number: [id number] Patient Account Number: [id number] Date of Birth/Sex: 08/23/1930 (84 y.o. Male) Treating RN: Cornell Barman Primary Care Alberta Cairns: Lelon Huh Other Clinician: Referring Lewin Pellow: Karle Barr Treating Bralen Wiltgen/Extender: Melburn Hake, Miguel Torres Weeks in Treatment: 0 Allergies Active Allergies No Known Drug Allergies Allergy Notes Electronic Signature(s) Signed: 01/19/2018 11:36:56 AM By: Gretta Cool, BSN, RN, CWS, Kim RN, BSN Entered By: Gretta Cool, BSN, RN, CWS, Kim on 01/19/2018 11:15:19 Miguel Torres ([id number]) -------------------------------------------------------------------------------- [place] Information Details Patient Name: Miguel Torres Date of Service: 01/19/2018 10:30 AM Medical Record Number: [id number] Patient Account Number: [id number] Date of Birth/Sex: [birth year] (84 y.o. Male) Treating RN: Montey Hora Primary Care Kenna Seward: Lelon Huh Other Clinician: Referring Talullah Abate: Karle Barr Treating Alera Quevedo/Extender: Melburn Hake, Miguel Torres Weeks in Treatment: 0 Visit Information Patient Arrived: Wheel Chair Arrival Time: 10:36 Accompanied By: daughter Transfer Assistance: Manual Patient Identification Verified: Yes Secondary Verification Process Yes Completed: Patient Has Alerts: Yes Patient Alerts: Patient on Blood Thinner Eliquis Electronic Signature(s) Signed: 01/19/2018 5:04:25 PM By: Montey Hora Entered By: Montey Hora on 01/19/2018 11:32:55 Miguel Torres ([id number]) -------------------------------------------------------------------------------- Clinic Level of Care Assessment Details Patient Name: Miguel Torres Date of Service: 01/19/2018 10:30 AM Medical Record Number: [id number] Patient Account Number: [id number] Date of Birth/Sex: [birth year] (84 y.o.  Male) Treating RN: Montey Hora Primary Care Luwanda Starr: Lelon Huh Other Clinician: Referring Habib Kise: Karle Barr Treating Kaytelynn Scripter/Extender: Melburn Hake, Miguel Torres Weeks in Treatment: 0 Clinic Level of Care Assessment Items TOOL 1 Quantity Score []  - Use when EandM and Procedure is performed on INITIAL visit 0 ASSESSMENTS - Nursing Assessment / Reassessment X - General Physical Exam (combine w/ comprehensive assessment (listed just below) when 1 20 performed on new pt. evals) X- 1 25 Comprehensive Assessment (HX, ROS, Risk Assessments, Wounds Hx, etc.) ASSESSMENTS - Wound and Skin Assessment / Reassessment []  - Dermatologic / Skin Assessment (not related to wound area) 0 ASSESSMENTS - Ostomy and/or Continence Assessment and Care []  - Incontinence Assessment and Management 0 []  - 0 Ostomy Care Assessment and Management (repouching, etc.) PROCESS - Coordination of Care X - Simple Patient / Family Education for ongoing care 1 15 []  - 0 Complex (extensive) Patient / Family Education for ongoing care X- 1 10 Staff obtains Programmer, systems, Records, Test Results / Process Orders []  - 0 Staff telephones HHA, Nursing Homes / Clarify orders / etc []  - 0 Routine Transfer to another Facility (non-emergent condition) []  - 0 Routine Hospital Admission (non-emergent condition) X- 1 15 New Admissions / Biomedical engineer / Ordering NPWT, Apligraf, etc. []  - 0 Emergency Hospital Admission (emergent condition) PROCESS - Special Needs []  - Pediatric / Minor Patient Management 0 []  - 0 Isolation Patient Management []  - 0 Hearing / Language / Visual special needs []  - 0 Assessment of Community assistance (transportation, D/C planning, etc.) []  - 0 Additional assistance / Altered mentation []  - 0 Support Surface(s) Assessment (bed, cushion, seat, etc.) Miguel Torres, Miguel Torres ([id number]) INTERVENTIONS - Miscellaneous []  - External ear exam 0 []  - 0 Patient Transfer (multiple staff /  Civil Service fast streamer / Similar devices) []  - 0 Simple Staple / Suture removal (25 or less) []  - 0 Complex Staple / Suture removal (26 or more) []  - 0 Hypo/Hyperglycemic Management (do not check if billed separately) []  - 0 Ankle / Brachial Index (ABI) - do not check if billed separately Has the patient been  seen at the hospital within the last three years: Yes Total Score: 85 Level Of Care: New/Established - Level 3 Electronic Signature(s) Signed: 01/19/2018 5:04:25 PM By: Montey Hora Entered By: Montey Hora on 01/19/2018 11:46:07 Miguel Torres ([id number]) -------------------------------------------------------------------------------- Encounter Discharge Information Details Patient Name: Miguel Torres Date of Service: 01/19/2018 10:30 AM Medical Record Number: [id number] Patient Account Number: [id number] Date of Birth/Sex: March 23, 1931 (84 y.o. Male) Treating RN: Cornell Barman Primary Care Ovid Witman: Lelon Huh Other Clinician: Referring Masami Plata: Karle Barr Treating Keelee Yankey/Extender: Melburn Hake, Miguel Torres Weeks in Treatment: 0 Encounter Discharge Information Items Discharge Condition: Stable Ambulatory Status: Ambulatory Discharge Destination: Skilled Nursing Facility Orders Sent: Yes Transportation: Private Auto Accompanied By: daughter Schedule Follow-up Appointment: Yes Clinical Summary of Care: Post Procedure Vitals: Temperature (F): 98.1 Pulse (bpm): 109 Respiratory Rate (breaths/min): 16 Blood Pressure (mmHg): 169/88 Electronic Signature(s) Signed: 01/19/2018 6:14:11 PM By: Gretta Cool, BSN, RN, CWS, Kim RN, BSN Entered By: Gretta Cool, BSN, RN, CWS, Kim on 01/19/2018 11:47:35 Miguel Torres ([id number]) -------------------------------------------------------------------------------- Lower Extremity Assessment Details Patient Name: Miguel Torres Date of Service: 01/19/2018 10:30 AM Medical Record Number: [id number] Patient Account Number: [id number] Date  of Birth/Sex: [birth year] (84 y.o. Male) Treating RN: Cornell Barman Primary Care Beatrice Sehgal: Lelon Huh Other Clinician: Referring Alysha Doolan: Karle Barr Treating Rashod Gougeon/Extender: Melburn Hake, Miguel Torres Weeks in Treatment: 0 Edema Assessment Assessed: [Left: Yes] [Right: No] Edema: [Left: Ye] [Right: s] Calf Left: Right: Point of Measurement: 31 cm From Medial Instep 33 cm cm Ankle Left: Right: Point of Measurement: 12 cm From Medial Instep 23.6 cm cm Vascular Assessment Pulses: Dorsalis Pedis Palpable: [Left:Yes] Doppler Audible: [Left:Yes] Posterior Tibial Palpable: [Left:Yes] Doppler Audible: [Left:Yes] Extremity colors, hair growth, and conditions: Extremity Color: [Left:Red] Hair Growth on Extremity: [Left:No] Temperature of Extremity: [Left:Warm] Capillary Refill: [Left:< 3 seconds] Toe Nail Assessment Left: Right: Thick: Yes Discolored: Yes Deformed: Yes Improper Length and Hygiene: Yes Electronic Signature(s) Signed: 01/19/2018 11:36:56 AM By: Gretta Cool, BSN, RN, CWS, Kim RN, BSN Entered By: Gretta Cool, BSN, RN, CWS, Kim on 01/19/2018 11:14:37 Miguel Torres ([id number]) -------------------------------------------------------------------------------- Multi Wound Chart Details Patient Name: Miguel Torres Date of Service: 01/19/2018 10:30 AM Medical Record Number: [id number] Patient Account Number: [id number] Date of Birth/Sex: [birth year] (84 y.o. Male) Treating RN: Montey Hora Primary Care Jumana Paccione: Lelon Huh Other Clinician: Referring Antoinette Borgwardt: Karle Barr Treating Giannis Corpuz/Extender: Miguel Torres, Miguel Torres Weeks in Treatment: 0 Vital Signs Height(in): 68 Pulse(bpm): 109 Weight(lbs): 216 Blood Pressure(mmHg): 169/88 Body Mass Index(BMI): 33 Temperature(F): 98.1 Respiratory Rate 18 (breaths/min): Photos: [1:No Photos] [N/A:N/A] Wound Location: [1:Left Foot - Lateral] [N/A:N/A] Wounding Event: [1:Gradually Appeared] [N/A:N/A] Primary Etiology:  [1:Diabetic Wound/Ulcer of the Lower Extremity] [N/A:N/A] Comorbid History: [1:Glaucoma, Congestive Heart Failure, Hypertension, Type II Diabetes, Gout, Osteoarthritis, Neuropathy] [N/A:N/A] Date Acquired: [1:11/17/2017] [N/A:N/A] Weeks of Treatment: [1:0] [N/A:N/A] Wound Status: [1:Open] [N/A:N/A] Measurements L x W x D [1:4x0.7x1] [N/A:N/A] (cm) Area (cm) : [1:2.199] [N/A:N/A] Volume (cm) : [1:2.199] [N/A:N/A] Classification: [1:Grade 2] [N/A:N/A] Exudate Amount: [1:Large] [N/A:N/A] Exudate Type: [1:Sanguinous] [N/A:N/A] Exudate Color: [1:red] [N/A:N/A] Wound Margin: [1:Indistinct, nonvisible] [N/A:N/A] Granulation Amount: [1:Small (1-33%)] [N/A:N/A] Granulation Quality: [1:Red] [N/A:N/A] Necrotic Amount: [1:Large (67-100%)] [N/A:N/A] Exposed Structures: [1:Fat Layer (Subcutaneous Tissue) Exposed: Yes Fascia: No Tendon: No Muscle: No Joint: No Bone: No] [N/A:N/A] Epithelialization: [1:Large (67-100%)] [N/A:N/A] Periwound Skin Texture: [1:Induration: Yes Excoriation: No Callus: No Crepitus: No] [N/A:N/A] Rash: No Scarring: No Periwound Skin Moisture: Maceration: No N/A N/A Dry/Scaly: No Periwound Skin Color: Atrophie Blanche: No N/A N/A Cyanosis: No Ecchymosis: No Erythema: No Hemosiderin Staining: No  Mottled: No Pallor: No Rubor: No Temperature: No Abnormality N/A N/A Tenderness on Palpation: Yes N/A N/A Wound Preparation: Ulcer Cleansing: N/A N/A Rinsed/Irrigated with Saline, Other: soap and water Topical Anesthetic Applied: Other: lidocaine 4% Treatment Notes Electronic Signature(s) Signed: 01/19/2018 5:04:25 PM By: Montey Hora Entered By: Montey Hora on 01/19/2018 11:33:57 Miguel Torres ([id number]) -------------------------------------------------------------------------------- [place] Details Patient Name: Miguel Torres Date of Service: 01/19/2018 10:30 AM Medical Record Number: [id number] Patient Account Number:  [id number] Date of Birth/Sex: 1930-06-29 (84 y.o. Male) Treating RN: Montey Hora Primary Care Natlie Asfour: Lelon Huh Other Clinician: Referring Braelynne Garinger: Karle Barr Treating Emma Birchler/Extender: Melburn Hake, Miguel Torres Weeks in Treatment: 0 Active Inactive ` Abuse / Safety / Falls / Self Care Management Nursing Diagnoses: Potential for falls Goals: Patient will remain injury free related to falls Date Initiated: 01/19/2018 Target Resolution Date: 04/02/2018 Goal Status: Active Interventions: Assess fall risk on admission and as needed Notes: ` Orientation to the Wound Care Program Nursing Diagnoses: Knowledge deficit related to the wound healing center program Goals: Patient/caregiver will verbalize understanding of the [facility] Program Date Initiated: 01/19/2018 Target Resolution Date: 04/02/2018 Goal Status: Active Interventions: Provide education on orientation to the wound center Notes: ` Wound/Skin Impairment Nursing Diagnoses: Impaired tissue integrity Goals: Ulcer/skin breakdown will heal within 14 weeks Date Initiated: 01/19/2018 Target Resolution Date: 04/02/2018 Goal Status: Active Interventions: Miguel Torres, Miguel Torres ([id number]) Assess patient/caregiver ability to obtain necessary supplies Assess patient/caregiver ability to perform ulcer/skin care regimen upon admission and as needed Assess ulceration(s) every visit Notes: Electronic Signature(s) Signed: 01/19/2018 5:04:25 PM By: Montey Hora Entered By: Montey Hora on 01/19/2018 11:33:47 Miguel Torres ([id number]) -------------------------------------------------------------------------------- Pain Assessment Details Patient Name: Miguel Torres Date of Service: 01/19/2018 10:30 AM Medical Record Number: [id number] Patient Account Number: [id number] Date of Birth/Sex: [birth year] (84 y.o. Male) Treating RN: Montey Hora Primary Care Yazleen Molock: Lelon Huh Other  Clinician: Referring Buck Mcaffee: Karle Barr Treating Tylisa Alcivar/Extender: Melburn Hake, Miguel Torres Weeks in Treatment: 0 Active Problems Location of Pain Severity and Description of Pain Patient Has Paino No Site Locations Pain Management and Medication Current Pain Management: Electronic Signature(s) Signed: 01/19/2018 2:31:41 PM By: Lorine Bears RCP, RRT, CHT Signed: 01/19/2018 5:04:25 PM By: Montey Hora Entered By: Lorine Bears on 01/19/2018 10:37:44 Miguel Torres ([id number]) -------------------------------------------------------------------------------- Patient/Caregiver Education Details Patient Name: Miguel Torres Date of Service: 01/19/2018 10:30 AM Medical Record Number: [id number] Patient Account Number: [id number] Date of Birth/Gender: November 16, 1930 (84 y.o. Male) Treating RN: Cornell Barman Primary Care Physician: Lelon Huh Other Clinician: Referring Physician: Karle Barr Treating Physician/Extender: Worthy Keeler Weeks in Treatment: 0 Education Assessment Education Provided To: Patient and Caregiver Education Topics Provided Wound/Skin Impairment: Handouts: Caring for Your Ulcer Methods: Demonstration, Explain/Verbal Responses: State content correctly Electronic Signature(s) Signed: 01/19/2018 6:14:11 PM By: Gretta Cool, BSN, RN, CWS, Kim RN, BSN Entered By: Gretta Cool, BSN, RN, CWS, Kim on 01/19/2018 11:47:49 Miguel Torres ([id number]) -------------------------------------------------------------------------------- Wound Assessment Details Patient Name: Miguel Torres Date of Service: 01/19/2018 10:30 AM Medical Record Number: [id number] Patient Account Number: [id number] Date of Birth/Sex: 1931/02/11 (84 y.o. Male) Treating RN: Cornell Barman Primary Care Manreet Kiernan: Lelon Huh Other Clinician: Referring Liany Mumpower: Karle Barr Treating Arney Mayabb/Extender: Miguel Torres, Miguel Torres Weeks in Treatment: 0 Wound Status Wound  Number: 1 Primary Diabetic Wound/Ulcer of the Lower Extremity Etiology: Wound Location: Left Foot - Lateral Wound Open Wounding Event: Gradually Appeared Status: Date Acquired: 11/17/2017 Comorbid Glaucoma, Congestive Heart Failure, Weeks Of Treatment: 0 History: Hypertension, Type II Diabetes,  Gout, Clustered Wound: No Osteoarthritis, Neuropathy Photos Photo Uploaded By: Gretta Cool, BSN, RN, CWS, Kim on 01/19/2018 18:08:52 Wound Measurements Length: (cm) 4 Width: (cm) 0.7 Depth: (cm) 1 Area: (cm) 2.199 Volume: (cm) 2.199 % Reduction in Area: % Reduction in Volume: Epithelialization: Large (67-100%) Tunneling: No Undermining: No Wound Description Classification: Grade 2 Foul Odor Wound Margin: Indistinct, nonvisible Slough/Fi Exudate Amount: Large Exudate Type: Sanguinous Exudate Color: red After Cleansing: No brino Yes Wound Bed Granulation Amount: Small (1-33%) Exposed Structure Granulation Quality: Red Fascia Exposed: No Necrotic Amount: Large (67-100%) Fat Layer (Subcutaneous Tissue) Exposed: Yes Necrotic Quality: Adherent Slough Tendon Exposed: No Muscle Exposed: No Joint Exposed: No Bone Exposed: No Periwound Skin Texture Miguel Torres, Miguel Torres. ([id number]) Texture Color No Abnormalities Noted: No No Abnormalities Noted: No Callus: No Atrophie Blanche: No Crepitus: No Cyanosis: No Excoriation: No Ecchymosis: No Induration: Yes Erythema: No Rash: No Hemosiderin Staining: No Scarring: No Mottled: No Pallor: No Moisture Rubor: No No Abnormalities Noted: No Dry / Scaly: No Temperature / Pain Maceration: No Temperature: No Abnormality Tenderness on Palpation: Yes Wound Preparation Ulcer Cleansing: Rinsed/Irrigated with Saline, Other: soap and water, Topical Anesthetic Applied: Other: lidocaine 4%, Treatment Notes Wound #1 (Left, Lateral Foot) 1. Cleansed with: Cleanse wound with antibacterial soap and water 2. Anesthetic Topical Lidocaine 4%  cream to wound bed prior to debridement 4. Dressing Applied: Prisma Ag 5. Secondary Dressing Applied Dry Gauze 6. Footwear/Offloading device applied Surgical shoe Notes kerlix and coban to secure Electronic Signature(s) Signed: 01/19/2018 11:36:56 AM By: Gretta Cool, BSN, RN, CWS, Kim RN, BSN Entered By: Gretta Cool, BSN, RN, CWS, Kim on 01/19/2018 11:10:42 Miguel Torres ([id number]) -------------------------------------------------------------------------------- [place] Details Patient Name: Miguel Torres Date of Service: 01/19/2018 10:30 AM Medical Record Number: [id number] Patient Account Number: [id number] Date of Birth/Sex: 1930/12/09 (84 y.o. Male) Treating RN: Cornell Barman Primary Care Chaim Gatley: Lelon Huh Other Clinician: Referring Wyat Infinger: Karle Barr Treating Joushua Dugar/Extender: Melburn Hake, Miguel Torres Weeks in Treatment: 0 Vital Signs Time Taken: 10:40 Temperature (F): 98.1 Height (in): 68 Pulse (bpm): 109 Weight (lbs): 216 Respiratory Rate (breaths/min): 18 Body Mass Index (BMI): 32.8 Blood Pressure (mmHg): 169/88 Reference Range: 80 - 120 mg / dl Electronic Signature(s) Signed: 01/19/2018 11:36:56 AM By: Gretta Cool, BSN, RN, CWS, Kim RN, BSN Entered By: Gretta Cool, BSN, RN, CWS, Kim on 01/19/2018 10:44:25

## 2018-01-25 ENCOUNTER — Ambulatory Visit
Admission: RE | Admit: 2018-01-25 | Discharge: 2018-01-25 | Disposition: A | Discharge status: Home / Self Care | Payer: Medicare Other | Source: Ambulatory Visit | Attending: Urology | Admitting: Urology

## 2018-01-25 DIAGNOSIS — R339 Retention of urine, unspecified: Secondary | ICD-10-CM | POA: Insufficient documentation

## 2018-01-25 DIAGNOSIS — E119 Type 2 diabetes mellitus without complications: Secondary | ICD-10-CM | POA: Diagnosis not present

## 2018-01-25 LAB — CBC
HCT: 29.6 % — ABNORMAL LOW (ref 40.0–52.0)
HEMOGLOBIN: 10.1 g/dL — AB (ref 13.0–18.0)
MCH: 32.1 pg (ref 26.0–34.0)
MCHC: 34.2 g/dL (ref 32.0–36.0)
MCV: 93.8 fL (ref 80.0–100.0)
Platelets: 214 10*3/uL (ref 150–440)
RBC: 3.16 MIL/uL — AB (ref 4.40–5.90)
RDW: 15.8 % — ABNORMAL HIGH (ref 11.5–14.5)
WBC: 7.5 10*3/uL (ref 3.8–10.6)

## 2018-01-25 LAB — PROTIME-INR
INR: 1.2
PROTHROMBIN TIME: 15.1 s (ref 11.4–15.2)

## 2018-01-25 LAB — GLUCOSE, CAPILLARY: Glucose-Capillary: 111 mg/dL — ABNORMAL HIGH (ref 70–99)

## 2018-01-25 MED ORDER — FENTANYL CITRATE (PF) 100 MCG/2ML IJ SOLN
INTRAMUSCULAR | Status: AC
  Filled 2018-01-25: qty 4

## 2018-01-25 MED ORDER — MIDAZOLAM HCL 5 MG/5ML IJ SOLN
INTRAMUSCULAR | Status: AC | PRN
  Administered 2018-01-25 (×2): 1 mg via INTRAVENOUS

## 2018-01-25 MED ORDER — SODIUM CHLORIDE 0.9 % IV SOLN
INTRAVENOUS | Status: DC
  Administered 2018-01-25: 10:00:00 via INTRAVENOUS

## 2018-01-25 MED ORDER — MIDAZOLAM HCL 5 MG/5ML IJ SOLN
INTRAMUSCULAR | Status: AC
  Filled 2018-01-25: qty 5

## 2018-01-25 MED ORDER — FENTANYL CITRATE (PF) 100 MCG/2ML IJ SOLN
INTRAMUSCULAR | Status: AC | PRN
  Administered 2018-01-25: 25 ug via INTRAVENOUS
  Administered 2018-01-25: 50 ug via INTRAVENOUS

## 2018-01-25 MED ORDER — LIDOCAINE HCL (PF) 1 % IJ SOLN
INTRAMUSCULAR | Status: AC | PRN
  Administered 2018-01-25: 7 mL

## 2018-01-25 NOTE — Procedures (Signed)
Urinary retention  S/p CT 16 fr SP cath insertion  No comp Stable EBL 0 Full report in pacs

## 2018-01-25 NOTE — Discharge Instructions (Signed)
Suprapubic Catheter Home Guide A suprapubic catheter is a rubber tube used to drain urine from the bladder into a collection bag. The catheter is inserted into the bladder through a small opening in the in the lower abdomen, near the center of the body, above the pubic bone (suprapubic area). There is a tiny balloon filled with germ-free (sterile) water on the end of the catheter that is in the bladder. The balloon helps to keep the catheter in place. Your suprapubic catheter may need to be replaced every 4-6 weeks, or as often as recommended by your health care provider. The collection bag must be emptied every day and cleaned every 2-3 days. The collection bag can be put beside your bed at night and attached to your leg during the day. You may have a large collection bag to use at night and a smaller one to use during the day. What are the risks?  Urine flow can become blocked. This can happen if the catheter is not working correctly, or if you have a blood clot in your bladder or in the catheter.  Tissue near the catheter may can become irritated and bleed.  Bacteria may get into your bladder and cause a urinary tract infection. How do I change the catheter? Supplies needed  Two pairs of sterile gloves.  Catheter.  Two syringes.  Sterile water.  Sterile cleaning solution.  Lubricant.  Collection bags. Changing the catheter To replace your catheter, take the following steps: 1. Drink plenty of fluids during the hours before you plan to change the catheter. 2. Wash your hands with soap and water. If soap and water are not available, use hand sanitizer. 3. Lie on your back and put on sterile gloves. 4. Clean the skin around the catheter opening using the sterile cleaning solution. 5. Remove the water from the balloon using a syringe. 6. Slowly remove the catheter. ? Do not pull on the catheter if it seems stuck. ? Call your health care provider immediately if you have difficulty  removing the catheter. 7. Take off the used gloves, and put on a new pair. 8. Put lubricant on the end of the new catheter that will go into your bladder. 9. Gently slide the catheter through the opening in your abdomen and into your bladder. 10. Wait for some urine to start flowing through the catheter. When urine starts to flow through the catheter, use a new syringe to fill the balloon with sterile water. 11. Attach the collection bag to the end of the catheter. Make sure the connection is tight. 12. Remove the gloves and wash your hands with soap and water.  How do I care for my skin around the catheter? Use a clean washcloth and soapy water to clean the skin around your catheter every day. Pat the area dry with a clean towel.  Do not pull on the catheter.  Do not use ointment or lotion on this area unless told by your health care provider.  Check your skin around the catheter every day for signs of infection. Check for: ? Redness, swelling, or pain. ? Fluid or blood. ? Warmth. ? Pus or a bad smell.  How do I clean and empty the collection bag? Clean the collection bag every 2-3 days, or as often as told by your health care provider. To do this, take the following steps:  Wash your hands with soap and water. If soap and water are not available, use hand sanitizer.  Disconnect the bag  from the catheter and immediately attach a new bag to the catheter.  Empty the used bag completely.  Clean the used bag using one of the following methods: ? Rinse the used bag with warm water and soap. ? Fill the bag with water and add 1 tsp of vinegar. Let it sit for about 30 minutes, then empty the bag.  Let the bag dry completely, and put it in a clean plastic bag before storing it.  Empty the large collection bag every 8 hours. Empty the small collection bag when it is about ? full. To empty your large or small collection bag, take the following steps:  Always keep the bag below the level  of the catheter. This keeps urine from flowing backwards into the catheter.  Hold the bag over the toilet or another container. Turn the valve (spigot) at the bottom of the bag to empty the urine. ? Do not touch the opening of the spigot. ? Do not let the opening touch the toilet or container.  Close the spigot tightly when the bag is empty.  What are some general tips?  Always wash your hands before and after caring for your catheter and collection bag. Use a mild, fragrance-free soap. If soap and water are not available, use hand sanitizer.  Clean the catheter with soap and water as often as told by your health care provider.  Always make sure there are no twists or curls (kinks) in the catheter tube.  Always make sure there are no leaks in the catheter or collection bag.  Drink enough fluid to keep your urine clear or pale yellow.  Do not take baths, swim, or use a hot tub. When should I seek medical care? Seek medical care if:  You leak urine.  You have redness, swelling, or pain around your catheter opening.  You have fluid or blood coming from your catheter opening.  Your catheter opening feels warm to the touch.  You have pus or a bad smell coming from your catheter opening.  You have a fever or chills.  Your urine flow slows down.  Your urine becomes cloudy or smelly.  When should I seek immediate medical care? Seek immediate medical care if your catheter comes out, or if you have:  Nausea.  Back pain.  Difficulty changing your catheter.  Blood in your urine.  No urine flow for 1 hour.  This information is not intended to replace advice given to you by your health care provider. Make sure you discuss any questions you have with your health care provider. Document Released: 01/07/2011 Document Revised: 12/19/2015 Document Reviewed: 01/02/2015 Elsevier Interactive Patient Education  2018 [employer].

## 2018-01-28 ENCOUNTER — Encounter: Payer: Medicare Other | Admitting: Physician Assistant

## 2018-01-28 DIAGNOSIS — E1140 Type 2 diabetes mellitus with diabetic neuropathy, unspecified: Secondary | ICD-10-CM | POA: Diagnosis not present

## 2018-01-28 DIAGNOSIS — E1151 Type 2 diabetes mellitus with diabetic peripheral angiopathy without gangrene: Secondary | ICD-10-CM | POA: Diagnosis not present

## 2018-01-28 DIAGNOSIS — L97526 Non-pressure chronic ulcer of other part of left foot with bone involvement without evidence of necrosis: Secondary | ICD-10-CM | POA: Diagnosis not present

## 2018-01-28 DIAGNOSIS — Z7984 Long term (current) use of oral hypoglycemic drugs: Secondary | ICD-10-CM | POA: Diagnosis not present

## 2018-01-28 DIAGNOSIS — E11621 Type 2 diabetes mellitus with foot ulcer: Secondary | ICD-10-CM | POA: Diagnosis not present

## 2018-01-28 DIAGNOSIS — L97522 Non-pressure chronic ulcer of other part of left foot with fat layer exposed: Secondary | ICD-10-CM | POA: Diagnosis not present

## 2018-01-28 DIAGNOSIS — H409 Unspecified glaucoma: Secondary | ICD-10-CM | POA: Diagnosis not present

## 2018-01-29 ENCOUNTER — Ambulatory Visit: Payer: Medicare Other

## 2018-01-31 NOTE — Progress Notes (Signed)
VANDEN, FAWAZ ([id number]) Visit Report for 01/28/2018 Arrival Information Details Patient Name: Miguel Torres, FLYNT Date of Service: 01/28/2018 9:15 AM Medical Record Number: [id number] Patient Account Number: [id number] Date of Birth/Sex: 1930/05/28 (84 y.o. M) Treating RN: Roger Shelter Primary Care Kikuye Korenek: Lelon Huh Other Clinician: Referring Irini Leet: Lelon Huh Treating Devi Hopman/Extender: Melburn Hake, HOYT Weeks in Treatment: 1 Visit Information History Since Last Visit Added or deleted any medications: No Patient Arrived: Ambulatory Any new allergies or adverse reactions: No Arrival Time: 09:17 Had a fall or experienced change in No Accompanied By: daughter activities of daily living that may affect Transfer Assistance: Manual risk of falls: Patient Identification Verified: Yes Signs or symptoms of abuse/neglect since last visito No Secondary Verification Process Yes Hospitalized since last visit: No Completed: Implantable device outside of the clinic excluding No Patient Has Alerts: Yes cellular tissue based products placed in the center Patient Alerts: Patient on Blood since last visit: Thinner Has Dressing in Place as Prescribed: Yes Eliquis Pain Present Now: No Electronic Signature(s) Signed: 01/28/2018 4:55:29 PM By: Lorine Bears RCP, RRT, CHT Entered By: Lorine Bears on 01/28/2018 09:19:07 Miguel Torres ([id number]) -------------------------------------------------------------------------------- Clinic Level of Care Assessment Details Patient Name: Miguel Torres Date of Service: 01/28/2018 9:15 AM Medical Record Number: [id number] Patient Account Number: [id number] Date of Birth/Sex: November 07, 1930 (84 y.o. M) Treating RN: Roger Shelter Primary Care Beaumont Austad: Lelon Huh Other Clinician: Referring Jevon Littlepage: Lelon Huh Treating Emary Zalar/Extender: Melburn Hake, HOYT Weeks in Treatment: 1 Clinic  Level of Care Assessment Items TOOL 4 Quantity Score X - Use when only an EandM is performed on FOLLOW-UP visit 1 0 ASSESSMENTS - Nursing Assessment / Reassessment X - Reassessment of Co-morbidities (includes updates in patient status) 1 10 X- 1 5 Reassessment of Adherence to Treatment Plan ASSESSMENTS - Wound and Skin Assessment / Reassessment X - Simple Wound Assessment / Reassessment - one wound 1 5 []  - 0 Complex Wound Assessment / Reassessment - multiple wounds []  - 0 Dermatologic / Skin Assessment (not related to wound area) ASSESSMENTS - Focused Assessment []  - Circumferential Edema Measurements - multi extremities 0 []  - 0 Nutritional Assessment / Counseling / Intervention []  - 0 Lower Extremity Assessment (monofilament, tuning fork, pulses) []  - 0 Peripheral Arterial Disease Assessment (using hand held doppler) ASSESSMENTS - Ostomy and/or Continence Assessment and Care []  - Incontinence Assessment and Management 0 []  - 0 Ostomy Care Assessment and Management (repouching, etc.) PROCESS - Coordination of Care X - Simple Patient / Family Education for ongoing care 1 15 []  - 0 Complex (extensive) Patient / Family Education for ongoing care []  - 0 Staff obtains Programmer, systems, Records, Test Results / Process Orders []  - 0 Staff telephones HHA, Nursing Homes / Clarify orders / etc []  - 0 Routine Transfer to another Facility (non-emergent condition) []  - 0 Routine Hospital Admission (non-emergent condition) []  - 0 New Admissions / Biomedical engineer / Ordering NPWT, Apligraf, etc. []  - 0 Emergency Hospital Admission (emergent condition) X- 1 10 Simple Discharge Coordination RANDELL, TEARE ([id number]) []  - 0 Complex (extensive) Discharge Coordination PROCESS - Special Needs []  - Pediatric / Minor Patient Management 0 []  - 0 Isolation Patient Management []  - 0 Hearing / Language / Visual special needs []  - 0 Assessment of Community assistance  (transportation, D/C planning, etc.) []  - 0 Additional assistance / Altered mentation []  - 0 Support Surface(s) Assessment (bed, cushion, seat, etc.) INTERVENTIONS - Wound Cleansing / Measurement X - Simple Wound Cleansing - one  wound 1 5 []  - 0 Complex Wound Cleansing - multiple wounds X- 1 5 Wound Imaging (photographs - any number of wounds) []  - 0 Wound Tracing (instead of photographs) X- 1 5 Simple Wound Measurement - one wound []  - 0 Complex Wound Measurement - multiple wounds INTERVENTIONS - Wound Dressings X - Small Wound Dressing one or multiple wounds 1 10 []  - 0 Medium Wound Dressing one or multiple wounds []  - 0 Large Wound Dressing one or multiple wounds []  - 0 Application of Medications - topical []  - 0 Application of Medications - injection INTERVENTIONS - Miscellaneous []  - External ear exam 0 []  - 0 Specimen Collection (cultures, biopsies, blood, body fluids, etc.) []  - 0 Specimen(s) / Culture(s) sent or taken to Lab for analysis []  - 0 Patient Transfer (multiple staff / Civil Service fast streamer / Similar devices) []  - 0 Simple Staple / Suture removal (25 or less) []  - 0 Complex Staple / Suture removal (26 or more) []  - 0 Hypo / Hyperglycemic Management (close monitor of Blood Glucose) []  - 0 Ankle / Brachial Index (ABI) - do not check if billed separately X- 1 5 Vital Signs EDWEN, MCLESTER ([id number]) Has the patient been seen at the hospital within the last three years: Yes Total Score: 75 Level Of Care: New/Established - Level 2 Electronic Signature(s) Signed: 01/29/2018 4:22:48 PM By: Roger Shelter Entered By: Roger Shelter on 01/28/2018 10:04:52 Miguel Torres ([id number]) -------------------------------------------------------------------------------- Encounter Discharge Information Details Patient Name: Miguel Torres Date of Service: 01/28/2018 9:15 AM Medical Record Number: [id number] Patient Account Number: [id number] Date of  Birth/Sex: 1930/12/16 (84 y.o. M) Treating RN: Cornell Barman Primary Care Shamara Soza: Lelon Huh Other Clinician: Referring Guadalupe Nickless: Lelon Huh Treating Abaigeal Moomaw/Extender: Sharalyn Ink in Treatment: 1 Encounter Discharge Information Items Discharge Condition: Stable Ambulatory Status: Ambulatory Discharge Destination: Home Transportation: Private Auto Schedule Follow-up Appointment: Yes Clinical Summary of Care: Electronic Signature(s) Signed: 01/28/2018 6:21:04 PM By: Gretta Cool, BSN, RN, CWS, Kim RN, BSN Entered By: Gretta Cool, BSN, RN, CWS, Kim on 01/28/2018 10:07:59 Miguel Torres ([id number]) -------------------------------------------------------------------------------- Lower Extremity Assessment Details Patient Name: Miguel Torres Date of Service: 01/28/2018 9:15 AM Medical Record Number: [id number] Patient Account Number: [id number] Date of Birth/Sex: [birth year] (84 y.o. M) Treating RN: Secundino Ginger Primary Care Chloe Bluett: Lelon Huh Other Clinician: Referring Hlee Fringer: Lelon Huh Treating Tekesha Almgren/Extender: Melburn Hake, HOYT Weeks in Treatment: 1 Edema Assessment Assessed: [Left: No] [Right: No] Edema: [Left: Ye] [Right: s] Calf Left: Right: Point of Measurement: 31 cm From Medial Instep 41 cm cm Ankle Left: Right: Point of Measurement: 12 cm From Medial Instep 23.5 cm cm Vascular Assessment Claudication: Claudication Assessment [Left:None] Pulses: Dorsalis Pedis Palpable: [Left:Yes] Posterior Tibial Extremity colors, hair growth, and conditions: Extremity Color: [Left:Red] Hair Growth on Extremity: [Left:No] Temperature of Extremity: [Left:Warm] Capillary Refill: [Left:< 3 seconds] Toe Nail Assessment Left: Right: Thick: Yes Discolored: Yes Deformed: Yes Improper Length and Hygiene: No Electronic Signature(s) Signed: 01/28/2018 2:29:34 PM By: Secundino Ginger Entered By: Secundino Ginger on 01/28/2018 09:34:49 Miguel Torres  ([id number]) -------------------------------------------------------------------------------- Multi Wound Chart Details Patient Name: Miguel Torres Date of Service: 01/28/2018 9:15 AM Medical Record Number: [id number] Patient Account Number: [id number] Date of Birth/Sex: 10/04/1930 (84 y.o. M) Treating RN: Roger Shelter Primary Care Meshach Perry: Lelon Huh Other Clinician: Referring Emeterio Balke: Lelon Huh Treating Mohanad Carsten/Extender: Melburn Hake, HOYT Weeks in Treatment: 1 Vital Signs Height(in): 68 Pulse(bpm): 106 Weight(lbs): 216 Blood Pressure(mmHg): 171/77 Body Mass Index(BMI): 33 Temperature(F): 98.2 Respiratory Rate 18 (breaths/min):  Photos: [N/A:N/A] Wound Location: Left Foot - Lateral N/A N/A Wounding Event: Gradually Appeared N/A N/A Primary Etiology: Diabetic Wound/Ulcer of the N/A N/A Lower Extremity Comorbid History: Glaucoma, Congestive Heart N/A N/A Failure, Hypertension, Type II Diabetes, Gout, Osteoarthritis, Neuropathy Date Acquired: 11/17/2017 N/A N/A Weeks of Treatment: 1 N/A N/A Wound Status: Open N/A N/A Measurements L x W x D 3.5x0.7x1 N/A N/A (cm) Area (cm) : 1.924 N/A N/A Volume (cm) : 1.924 N/A N/A % Reduction in Area: 12.50% N/A N/A % Reduction in Volume: 12.50% N/A N/A Classification: Grade 2 N/A N/A Exudate Amount: Large N/A N/A Exudate Type: Serous N/A N/A Exudate Color: amber N/A N/A Wound Margin: Indistinct, nonvisible N/A N/A Granulation Amount: Small (1-33%) N/A N/A Granulation Quality: Red N/A N/A Necrotic Amount: Small (1-33%) N/A N/A Exposed Structures: Fat Layer (Subcutaneous N/A N/A Tissue) Exposed: Yes Fascia: No Tendon: No CANTRELL, LAROUCHE. ([id number]) Muscle: No Joint: No Bone: No Epithelialization: Large (67-100%) N/A N/A Periwound Skin Texture: Induration: Yes N/A N/A Excoriation: No Callus: No Crepitus: No Rash: No Scarring: No Periwound Skin Moisture: Maceration: Yes N/A N/A Dry/Scaly:  No Periwound Skin Color: Atrophie Blanche: No N/A N/A Cyanosis: No Ecchymosis: No Erythema: No Hemosiderin Staining: No Mottled: No Pallor: No Rubor: No Temperature: No Abnormality N/A N/A Tenderness on Palpation: Yes N/A N/A Wound Preparation: Ulcer Cleansing: N/A N/A Rinsed/Irrigated with Saline, Other: soap and water Topical Anesthetic Applied: Other: lidocaine 4% Treatment Notes Electronic Signature(s) Signed: 01/29/2018 4:22:48 PM By: Roger Shelter Entered By: Roger Shelter on 01/28/2018 09:59:17 Miguel Torres ([id number]) -------------------------------------------------------------------------------- [place] Details Patient Name: Miguel Torres Date of Service: 01/28/2018 9:15 AM Medical Record Number: [id number] Patient Account Number: [id number] Date of Birth/Sex: Dec 07, 1930 (84 y.o. M) Treating RN: Roger Shelter Primary Care Teniqua Marron: Lelon Huh Other Clinician: Referring Starlena Beil: Lelon Huh Treating Romani Wilbon/Extender: Melburn Hake, HOYT Weeks in Treatment: 1 Active Inactive ` Abuse / Safety / Falls / Self Care Management Nursing Diagnoses: Potential for falls Goals: Patient will remain injury free related to falls Date Initiated: 01/19/2018 Target Resolution Date: 04/02/2018 Goal Status: Active Interventions: Assess fall risk on admission and as needed Notes: ` Orientation to the Wound Care Program Nursing Diagnoses: Knowledge deficit related to the wound healing center program Goals: Patient/caregiver will verbalize understanding of the [place] Date Initiated: 01/19/2018 Target Resolution Date: 04/02/2018 Goal Status: Active Interventions: Provide education on orientation to the wound center Notes: ` Wound/Skin Impairment Nursing Diagnoses: Impaired tissue integrity Goals: Ulcer/skin breakdown will heal within 14 weeks Date Initiated: 01/19/2018 Target Resolution Date:  04/02/2018 Goal Status: Active Interventions: MIKLOS, BIDINGER ([id number]) Assess patient/caregiver ability to obtain necessary supplies Assess patient/caregiver ability to perform ulcer/skin care regimen upon admission and as needed Assess ulceration(s) every visit Notes: Electronic Signature(s) Signed: 01/29/2018 4:22:48 PM By: Roger Shelter Entered By: Roger Shelter on 01/28/2018 09:58:43 Miguel Torres ([id number]) -------------------------------------------------------------------------------- Pain Assessment Details Patient Name: Miguel Torres Date of Service: 01/28/2018 9:15 AM Medical Record Number: [id number] Patient Account Number: [id number] Date of Birth/Sex: 18-Oct-1930 (84 y.o. M) Treating RN: Roger Shelter Primary Care Giordana Weinheimer: Lelon Huh Other Clinician: Referring Allysia Ingles: Lelon Huh Treating Julianne Chamberlin/Extender: Melburn Hake, HOYT Weeks in Treatment: 1 Active Problems Location of Pain Severity and Description of Pain Patient Has Paino No Site Locations Pain Management and Medication Current Pain Management: Electronic Signature(s) Signed: 01/28/2018 4:55:29 PM By: Lorine Bears RCP, RRT, CHT Signed: 01/29/2018 4:22:48 PM By: Roger Shelter Entered By: Lorine Bears on 01/28/2018 09:19:13 Revell,  CHISTIAN KASLER ([id number]) -------------------------------------------------------------------------------- Patient/Caregiver Education Details Patient Name: Miguel Torres Date of Service: 01/28/2018 9:15 AM Medical Record Number: [id number] Patient Account Number: [id number] Date of Birth/Gender: January 05, 1931 (84 y.o. M) Treating RN: Roger Shelter Primary Care Physician: Lelon Huh Other Clinician: Referring Physician: Lelon Huh Treating Physician/Extender: Sharalyn Ink in Treatment: 1 Education Assessment Education Provided To: Patient and Caregiver Education Topics Provided Wound/Skin  Impairment: Handouts: Caring for Your Ulcer Methods: Explain/Verbal Responses: State content correctly Electronic Signature(s) Signed: 01/29/2018 4:22:48 PM By: Roger Shelter Entered By: Roger Shelter on 01/28/2018 10:05:16 Miguel Torres ([id number]) -------------------------------------------------------------------------------- Wound Assessment Details Patient Name: Miguel Torres Date of Service: 01/28/2018 9:15 AM Medical Record Number: [id number] Patient Account Number: [id number] Date of Birth/Sex: 10-02-1930 (84 y.o. M) Treating RN: Secundino Ginger Primary Care Jakson Delpilar: Lelon Huh Other Clinician: Referring Maridel Pixler: Lelon Huh Treating Breccan Galant/Extender: Melburn Hake, HOYT Weeks in Treatment: 1 Wound Status Wound Number: 1 Primary Diabetic Wound/Ulcer of the Lower Extremity Etiology: Wound Location: Left Foot - Lateral Wound Open Wounding Event: Gradually Appeared Status: Date Acquired: 11/17/2017 Comorbid Glaucoma, Congestive Heart Failure, Weeks Of Treatment: 1 History: Hypertension, Type II Diabetes, Gout, Clustered Wound: No Osteoarthritis, Neuropathy Photos Photo Uploaded By: Secundino Ginger on 01/28/2018 09:38:35 Wound Measurements Length: (cm) 3.5 Width: (cm) 0.7 Depth: (cm) 1 Area: (cm) 1.924 Volume: (cm) 1.924 % Reduction in Area: 12.5% % Reduction in Volume: 12.5% Epithelialization: Large (67-100%) Tunneling: No Undermining: No Wound Description Classification: Grade 2 Foul Odor A Wound Margin: Indistinct, nonvisible Slough/Fibr Exudate Amount: Large Exudate Type: Serous Exudate Color: amber fter Cleansing: No ino Yes Wound Bed Granulation Amount: Small (1-33%) Exposed Structure Granulation Quality: Red Fascia Exposed: No Necrotic Amount: Small (1-33%) Fat Layer (Subcutaneous Tissue) Exposed: Yes Necrotic Quality: Adherent Slough Tendon Exposed: No Muscle Exposed: No Joint Exposed: No Bone Exposed: No Periwound Skin  Texture MAKSIM, PEREGOY ([id number]) Texture Color No Abnormalities Noted: No No Abnormalities Noted: No Callus: No Atrophie Blanche: No Crepitus: No Cyanosis: No Excoriation: No Ecchymosis: No Induration: Yes Erythema: No Rash: No Hemosiderin Staining: No Scarring: No Mottled: No Pallor: No Moisture Rubor: No No Abnormalities Noted: No Dry / Scaly: No Temperature / Pain Maceration: Yes Temperature: No Abnormality Tenderness on Palpation: Yes Wound Preparation Ulcer Cleansing: Rinsed/Irrigated with Saline, Other: soap and water, Topical Anesthetic Applied: Other: lidocaine 4%, Treatment Notes Wound #1 (Left, Lateral Foot) Notes kerlix and coban to secure; Prism on foot, Silverell on all. Electronic Signature(s) Signed: 01/28/2018 2:29:34 PM By: Secundino Ginger Entered By: Secundino Ginger on 01/28/2018 09:36:15 Miguel Torres ([id number]) -------------------------------------------------------------------------------- [place] Details Patient Name: Miguel Torres Date of Service: 01/28/2018 9:15 AM Medical Record Number: [id number] Patient Account Number: [id number] Date of Birth/Sex: 1930/11/22 (84 y.o. M) Treating RN: Roger Shelter Primary Care Shalee Paolo: Lelon Huh Other Clinician: Referring Rohn Fritsch: Lelon Huh Treating Sonni Barse/Extender: Melburn Hake, HOYT Weeks in Treatment: 1 Vital Signs Time Taken: 09:20 Temperature (F): 98.2 Height (in): 68 Pulse (bpm): 106 Weight (lbs): 216 Respiratory Rate (breaths/min): 18 Body Mass Index (BMI): 32.8 Blood Pressure (mmHg): 171/77 Reference Range: 80 - 120 mg / dl Electronic Signature(s) Signed: 01/28/2018 4:55:29 PM By: Lorine Bears RCP, RRT, CHT Entered By: Becky Sax, Amado Nash on 01/28/2018 09:21:48

## 2018-01-31 NOTE — Progress Notes (Signed)
Miguel Torres ([id number]) Visit Report for 01/28/2018 Chief Complaint Document Details Patient Name: Miguel Torres Date of Service: 01/28/2018 9:15 AM Medical Record Number: [id number] Patient Account Number: [id number] Date of Birth/Sex: 06/11/1930 (84 y.o. M) Treating RN: Roger Shelter Primary Care Provider: Lelon Huh Other Clinician: Referring Provider: Lelon Huh Treating Provider/Extender: Melburn Hake, HOYT Weeks in Treatment: 1 Information Obtained from: Patient Chief Complaint Left foot ulcer Electronic Signature(s) Signed: 01/28/2018 3:07:42 PM By: Worthy Keeler PA-C Entered By: Worthy Keeler on 01/28/2018 09:13:06 Miguel Torres ([id number]) -------------------------------------------------------------------------------- HPI Details Patient Name: Miguel Torres Date of Service: 01/28/2018 9:15 AM Medical Record Number: [id number] Patient Account Number: [id number] Date of Birth/Sex: 01-12-1931 (84 y.o. M) Treating RN: Roger Shelter Primary Care Provider: Lelon Huh Other Clinician: Referring Provider: Lelon Huh Treating Provider/Extender: Melburn Hake, HOYT Weeks in Treatment: 1 History of Present Illness Associated Signs and Symptoms: Patient has a history of diabetes mellitus type II, lymphedema, a right below knee amputation which was performed 10 years ago, and proof of vascular disease which is managed by [facility], Dr. dew HPI Description: 01/19/18 on evaluation today patient presents for initial evaluation and clinic due to issues that he has been having with a left lateral foot ulcer which has been present for at minimum two months although this may have actually been longer before the family knew of the wound. Currently he has been seen [facility] where they have performed arterial studies. I did have those for review today which did show that he has mild left lower extremity arterial disease  with a abnormal left toe brachial index. His ABI was 0.93 with a TBI of 0.26. Upon further evaluation it appears that the patient actually was recommended to have an angiogram by Dr. dew. This was due to the fact that there was concerned about the low TBI and the fact that he may not be perfusion all the way down into his foot. With that being said the final decision as to whether or not to proceed with this has not been made as of yet according to the patient's daughter who was present for evaluation at this time today. The patient has no evidence of infection at this time. With that being said there is bone exposed in the base of the wound unfortunately. This obviously may indicate a more significant issue underlying just the open wound. Other than that he actually appears to have a significant amount of epithelialization noted which is excellent news. No fevers, chills, nausea, or vomiting noted at this time. He has had an x-ray which revealed no evidence of osteomyelitis this was performed July 2019. No MRI has been performed at this point. 01/28/18 on evaluation today patient actually appears to be doing about the same in regard to his left lateral foot ulcer. He does not have as much is the way of necrotic tissue overlying the surface of the wound in fact no sharp debridement was necessary. With that being said he does have some maceration noted. He may benefit from adding alginate over the Prisma along the lateral aspect of his wound. In regard to the Kerlex and Coban wrap it appears that this actually was not wrapped as high as it should've been and therefore the patient felt blisters on the lateral portion of his upper left lower extremity. Electronic Signature(s) Signed: 01/28/2018 3:07:42 PM By: Worthy Keeler PA-C Entered By: Worthy Keeler on 01/28/2018 10:09:48 Miguel Torres ([id number]) --------------------------------------------------------------------------------  Physical  Exam Details Patient Name: Miguel Torres, Miguel Torres Date of Service: 01/28/2018 9:15 AM Medical Record Number: [id number] Patient Account Number: [id number] Date of Birth/Sex: Jul 20, 1930 (84 y.o. M) Treating RN: Roger Shelter Primary Care Provider: Lelon Huh Other Clinician: Referring Provider: Lelon Huh Treating Provider/Extender: Melburn Hake, HOYT Weeks in Treatment: 1 Constitutional Well-nourished and well-hydrated in no acute distress. Respiratory normal breathing without difficulty. clear to auscultation bilaterally. Cardiovascular regular rate and rhythm with normal S1, S2. Psychiatric this patient is able to make decisions and demonstrates good insight into disease process. Alert and Oriented x 3. pleasant and cooperative. Notes Patient's wound bed at this point time shows evidence of good granulation although I still can probe down to bone unfortunately. We are awaiting them ride which is scheduled for October 4. Electronic Signature(s) Signed: 01/28/2018 3:07:42 PM By: Worthy Keeler PA-C Entered By: Worthy Keeler on 01/28/2018 10:18:27 Miguel Torres ([id number]) -------------------------------------------------------------------------------- Physician Orders Details Patient Name: Miguel Torres Date of Service: 01/28/2018 9:15 AM Medical Record Number: [id number] Patient Account Number: [id number] Date of Birth/Sex: [birth year] (84 y.o. M) Treating RN: Roger Shelter Primary Care Provider: Lelon Huh Other Clinician: Referring Provider: Lelon Huh Treating Provider/Extender: Melburn Hake, HOYT Weeks in Treatment: 1 Verbal / Phone Orders: No Diagnosis Coding ICD-10 Coding Code Description E11.621 Type 2 diabetes mellitus with foot ulcer L97.526 Non-pressure chronic ulcer of other part of left foot with bone involvement without evidence of necrosis I89.0 Lymphedema, not elsewhere classified I73.89 Other specified peripheral vascular  diseases Z89.511 Acquired absence of right leg below knee Wound Cleansing Wound #1 Left,Lateral Foot o Cleanse wound with mild soap and water Anesthetic (add to Medication List) Wound #1 Left,Lateral Foot o Topical Lidocaine 4% cream applied to wound bed prior to debridement (In Clinic Only). Primary Wound Dressing Wound #1 Left,Lateral Foot o Silver Collagen Secondary Dressing Wound #1 Left,Lateral Foot o ABD pad o Other - place silvercell over prisma on lateral area of foot and place silvercell over blistered/ red areas on medial lower leg. Dressing Change Frequency Wound #1 Left,Lateral Foot o Change Dressing Monday, Wednesday, Friday Follow-up Appointments Wound #1 Left,Lateral Foot o Return Appointment in 1 week. Edema Control Wound #1 Left,Lateral Foot o Kerlix and Coban - Left Lower Extremity - DO NOT WRAP TOO TIGHT.******* make sure the wrap starts 2 finger widths below the toes and stops 3 finger widths below the knee.******** Miguel Torres ([id number]) Electronic Signature(s) Signed: 01/28/2018 3:07:42 PM By: Worthy Keeler PA-C Signed: 01/29/2018 4:22:48 PM By: Roger Shelter Entered By: Roger Shelter on 01/28/2018 10:03:57 Miguel Torres ([id number]) -------------------------------------------------------------------------------- Problem List Details Patient Name: Miguel Torres Date of Service: 01/28/2018 9:15 AM Medical Record Number: [id number] Patient Account Number: [id number] Date of Birth/Sex: [birth year] (84 y.o. M) Treating RN: Roger Shelter Primary Care Provider: Lelon Huh Other Clinician: Referring Provider: Lelon Huh Treating Provider/Extender: Melburn Hake, HOYT Weeks in Treatment: 1 Active Problems ICD-10 Evaluated Encounter Code Description Active Date Today Diagnosis E11.621 Type 2 diabetes mellitus with foot ulcer 01/19/2018 No Yes L97.526 Non-pressure chronic ulcer of other part of left foot with  bone 01/19/2018 No Yes involvement without evidence of necrosis I89.0 Lymphedema, not elsewhere classified 01/19/2018 No Yes I73.89 Other specified peripheral vascular diseases 01/19/2018 No Yes Z89.511 Acquired absence of right leg below knee 01/19/2018 No Yes Inactive Problems Resolved Problems Electronic Signature(s) Signed: 01/28/2018 3:07:42 PM By: Worthy Keeler PA-C Entered By: Worthy Keeler on 01/28/2018 09:12:59 Miguel Torres ([id number]) -------------------------------------------------------------------------------- Progress  Note Details Patient Name: Miguel Torres, Miguel Torres Date of Service: 01/28/2018 9:15 AM Medical Record Number: [id number] Patient Account Number: [id number] Date of Birth/Sex: [birth year] (84 y.o. M) Treating RN: Roger Shelter Primary Care Provider: Lelon Huh Other Clinician: Referring Provider: Lelon Huh Treating Provider/Extender: Melburn Hake, HOYT Weeks in Treatment: 1 Subjective Chief Complaint Information obtained from Patient Left foot ulcer History of Present Illness (HPI) The following HPI elements were documented for the patient's wound: Associated Signs and Symptoms: Patient has a history of diabetes mellitus type II, lymphedema, a right below knee amputation which was performed 10 years ago, and proof of vascular disease which is managed by [facility], Dr. dew 01/19/18 on evaluation today patient presents for initial evaluation and clinic due to issues that he has been having with a left lateral foot ulcer which has been present for at minimum two months although this may have actually been longer before the family knew of the wound. Currently he has been seen [facility] where they have performed arterial studies. I did have those for review today which did show that he has mild left lower extremity arterial disease with a abnormal left toe brachial index. His ABI was 0.93 with a TBI of 0.26. Upon  further evaluation it appears that the patient actually was recommended to have an angiogram by Dr. dew. This was due to the fact that there was concerned about the low TBI and the fact that he may not be perfusion all the way down into his foot. With that being said the final decision as to whether or not to proceed with this has not been made as of yet according to the patient's daughter who was present for evaluation at this time today. The patient has no evidence of infection at this time. With that being said there is bone exposed in the base of the wound unfortunately. This obviously may indicate a more significant issue underlying just the open wound. Other than that he actually appears to have a significant amount of epithelialization noted which is excellent news. No fevers, chills, nausea, or vomiting noted at this time. He has had an x-ray which revealed no evidence of osteomyelitis this was performed July 2019. No MRI has been performed at this point. 01/28/18 on evaluation today patient actually appears to be doing about the same in regard to his left lateral foot ulcer. He does not have as much is the way of necrotic tissue overlying the surface of the wound in fact no sharp debridement was necessary. With that being said he does have some maceration noted. He may benefit from adding alginate over the Prisma along the lateral aspect of his wound. In regard to the Kerlex and Coban wrap it appears that this actually was not wrapped as high as it should've been and therefore the patient felt blisters on the lateral portion of his upper left lower extremity. Patient History Information obtained from Patient. Family History Diabetes - Mother, Heart Disease - Mother, Hypertension - Mother, No family history of Kidney Disease, Lung Disease, Seizures, Stroke, Thyroid Problems, Tuberculosis. Social History Never smoker, Marital Status - Married, Alcohol Use - Never, Drug Use - No History,  Caffeine Use - Daily. Review of Systems (ROS) Constitutional Symptoms (General Health) Denies complaints or symptoms of Fever, Chills. Respiratory The patient has no complaints or symptoms. Miguel Torres, Miguel Torres ([id number]) Cardiovascular Complains or has symptoms of LE edema. Psychiatric The patient has no complaints or symptoms. Objective Constitutional Well-nourished  and well-hydrated in no acute distress. Vitals Time Taken: 9:20 AM, Height: 68 in, Weight: 216 lbs, BMI: 32.8, Temperature: 98.2 F, Pulse: 106 bpm, Respiratory Rate: 18 breaths/min, Blood Pressure: 171/77 mmHg. Respiratory normal breathing without difficulty. clear to auscultation bilaterally. Cardiovascular regular rate and rhythm with normal S1, S2. Psychiatric this patient is able to make decisions and demonstrates good insight into disease process. Alert and Oriented x 3. pleasant and cooperative. General Notes: Patient's wound bed at this point time shows evidence of good granulation although I still can probe down to bone unfortunately. We are awaiting them ride which is scheduled for October 4. Integumentary (Hair, Skin) Wound #1 status is Open. Original cause of wound was Gradually Appeared. The wound is located on the Left,Lateral Foot. The wound measures 3.5cm length x 0.7cm width x 1cm depth; 1.924cm^2 area and 1.924cm^3 volume. There is Fat Layer (Subcutaneous Tissue) Exposed exposed. There is no tunneling or undermining noted. There is a large amount of serous drainage noted. The wound margin is indistinct and nonvisible. There is small (1-33%) red granulation within the wound bed. There is a small (1-33%) amount of necrotic tissue within the wound bed including Adherent Slough. The periwound skin appearance exhibited: Induration, Maceration. The periwound skin appearance did not exhibit: Callus, Crepitus, Excoriation, Rash, Scarring, Dry/Scaly, Atrophie Blanche, Cyanosis, Ecchymosis, Hemosiderin  Staining, Mottled, Pallor, Rubor, Erythema. Periwound temperature was noted as No Abnormality. The periwound has tenderness on palpation. Assessment Active Problems ICD-10 Type 2 diabetes mellitus with foot ulcer Non-pressure chronic ulcer of other part of left foot with bone involvement without evidence of necrosis Lymphedema, not elsewhere classified Other specified peripheral vascular diseases Miguel Torres, Miguel Torres ([id number]) Acquired absence of right leg below knee Plan Wound Cleansing: Wound #1 Left,Lateral Foot: Cleanse wound with mild soap and water Anesthetic (add to Medication List): Wound #1 Left,Lateral Foot: Topical Lidocaine 4% cream applied to wound bed prior to debridement (In Clinic Only). Primary Wound Dressing: Wound #1 Left,Lateral Foot: Silver Collagen Secondary Dressing: Wound #1 Left,Lateral Foot: ABD pad Other - place silvercell over prisma on lateral area of foot and place silvercell over blistered/ red areas on medial lower leg. Dressing Change Frequency: Wound #1 Left,Lateral Foot: Change Dressing Monday, Wednesday, Friday Follow-up Appointments: Wound #1 Left,Lateral Foot: Return Appointment in 1 week. Edema Control: Wound #1 Left,Lateral Foot: Kerlix and Coban - Left Lower Extremity - DO NOT WRAP TOO TIGHT.******* make sure the wrap starts 2 finger widths below the toes and stops 3 finger widths below the knee.******** At this time I'm gonna recommend that we continue with the above wound care measures for the next week. Will subsequently see were things stand at follow-up. If he has any changes in his health status he will let me know. Otherwise will see him back for reevaluation in one week. Please see above for specific wound care orders. We will see patient for re-evaluation in 1 week(s) here in the clinic. If anything worsens or changes patient will contact our office for additional recommendations. Electronic Signature(s) Signed: 01/28/2018  3:07:42 PM By: Worthy Keeler PA-C Entered By: Worthy Keeler on 01/28/2018 10:35:59 Miguel Torres ([id number]) -------------------------------------------------------------------------------- ROS/PFSH Details Patient Name: Miguel Torres Date of Service: 01/28/2018 9:15 AM Medical Record Number: [id number] Patient Account Number: [id number] Date of Birth/Sex: 1930-07-28 (84 y.o. M) Treating RN: Roger Shelter Primary Care Provider: Lelon Huh Other Clinician: Referring Provider: Lelon Huh Treating Provider/Extender: Melburn Hake, HOYT Weeks in Treatment: 1 Information Obtained From Patient Wound History  Do you currently have one or more open woundso Yes How many open wounds do you currently haveo 1 Approximately how long have you had your woundso 6 months How have you been treating your wound(s) until nowo Santyl Has your wound(s) ever healed and then re-openedo No Have you had any lab work done in the past montho No Have you tested positive for an antibiotic resistant organism (MRSA, VRE)o No Have you tested positive for osteomyelitis (bone infection)o No Have you had any tests for circulation on your legso Yes Constitutional Symptoms (General Health) Complaints and Symptoms: Negative for: Fever; Chills Cardiovascular Complaints and Symptoms: Positive for: LE edema Medical History: Positive for: Congestive Heart Failure; Hypertension Negative for: Angina; Arrhythmia; Coronary Artery Disease; Deep Vein Thrombosis; Hypotension; Myocardial Infarction; Peripheral Arterial Disease; Peripheral Venous Disease; Phlebitis; Vasculitis Eyes Medical History: Positive for: Glaucoma Negative for: Cataracts; Optic Neuritis Ear/Nose/Mouth/Throat Medical History: Negative for: Chronic sinus problems/congestion; Middle ear problems Hematologic/Lymphatic Medical History: Negative for: Anemia; Hemophilia; Human Immunodeficiency Virus; Lymphedema; Sickle Cell  Disease Respiratory Complaints and Symptoms: No Complaints or Symptoms Medical History: Miguel Torres, Miguel Torres ([id number]) Negative for: Aspiration; Asthma; Chronic Obstructive Pulmonary Disease (COPD); Pneumothorax; Sleep Apnea; Tuberculosis Gastrointestinal Medical History: Negative for: Cirrhosis ; Colitis; Crohnos; Hepatitis A; Hepatitis B; Hepatitis C Endocrine Medical History: Positive for: Type II Diabetes Negative for: Type I Diabetes Time with diabetes: 10 years Treated with: Oral agents Blood sugar tested every day: Yes Tested : Genitourinary Medical History: Negative for: End Stage Renal Disease Immunological Medical History: Negative for: Lupus Erythematosus; Raynaudos; Scleroderma Integumentary (Skin) Medical History: Negative for: History of Burn; History of pressure wounds Musculoskeletal Medical History: Positive for: Gout; Osteoarthritis Negative for: Rheumatoid Arthritis; Osteomyelitis Neurologic Medical History: Positive for: Neuropathy Negative for: Dementia; Quadriplegia; Paraplegia; Seizure Disorder Oncologic Medical History: Negative for: Received Chemotherapy; Received Radiation Psychiatric Complaints and Symptoms: No Complaints or Symptoms Medical History: Negative for: Anorexia/bulimia; Confinement Anxiety HBO Extended History Items Miguel Torres, Miguel Torres ([id number]) Eyes: Glaucoma Immunizations Pneumococcal Vaccine: Received Pneumococcal Vaccination: Yes Implantable Devices Family and Social History Diabetes: Yes - Mother; Heart Disease: Yes - Mother; Hypertension: Yes - Mother; Kidney Disease: No; Lung Disease: No; Seizures: No; Stroke: No; Thyroid Problems: No; Tuberculosis: No; Never smoker; Marital Status - Married; Alcohol Use: Never; Drug Use: No History; Caffeine Use: Daily; Advanced Directives: Yes (Not Provided); Patient does not want information on Advanced Directives; Living Will: Yes (Not Provided); Medical Power of Attorney:  Yes (Copy provided) Physician Affirmation I have reviewed and agree with the above information. Electronic Signature(s) Signed: 01/28/2018 3:07:42 PM By: Worthy Keeler PA-C Signed: 01/29/2018 4:22:48 PM By: Roger Shelter Entered By: Worthy Keeler on 01/28/2018 10:10:07 Miguel Torres ([id number]) -------------------------------------------------------------------------------- SuperBill Details Patient Name: Miguel Torres Date of Service: 01/28/2018 Medical Record Number: [id number] Patient Account Number: [id number] Date of Birth/Sex: 1930-12-16 (84 y.o. M) Treating RN: Roger Shelter Primary Care Provider: Lelon Huh Other Clinician: Referring Provider: Lelon Huh Treating Provider/Extender: Melburn Hake, HOYT Weeks in Treatment: 1 Diagnosis Coding ICD-10 Codes Code Description E11.621 Type 2 diabetes mellitus with foot ulcer L97.526 Non-pressure chronic ulcer of other part of left foot with bone involvement without evidence of necrosis I89.0 Lymphedema, not elsewhere classified I73.89 Other specified peripheral vascular diseases Z89.511 Acquired absence of right leg below knee Facility Procedures CPT4 Code: [id number] Description: 60630 - WOUND CARE VISIT-LEV 2 EST PT Modifier: Quantity: 1 Physician Procedures CPT4: Description Modifier Quantity Code [id number] 23557 - WC PHYS LEVEL 3 - EST PT 1 ICD-10  Diagnosis Description E11.621 Type 2 diabetes mellitus with foot ulcer L97.526 Non-pressure chronic ulcer of other part of left foot with bone involvement without  evidence of necrosis I89.0 Lymphedema, not elsewhere classified I73.89 Other specified peripheral vascular diseases Electronic Signature(s) Signed: 01/28/2018 3:07:42 PM By: Worthy Keeler PA-C Entered By: Worthy Keeler on 01/28/2018 10:36:20

## 2018-02-01 ENCOUNTER — Telehealth: Payer: Self-pay

## 2018-02-01 NOTE — Telephone Encounter (Signed)
-----   Message from Nori Riis, PA-C sent at 02/01/2018  1:02 PM EDT ----- Would someone check on Mr. Tinnel to see how he is doing after his SPT placement by IR.

## 2018-02-01 NOTE — Telephone Encounter (Signed)
Called to check on pt, no answer and vm is full.

## 2018-02-02 DIAGNOSIS — N401 Enlarged prostate with lower urinary tract symptoms: Secondary | ICD-10-CM | POA: Diagnosis not present

## 2018-02-02 DIAGNOSIS — N319 Neuromuscular dysfunction of bladder, unspecified: Secondary | ICD-10-CM | POA: Diagnosis not present

## 2018-02-02 DIAGNOSIS — E1122 Type 2 diabetes mellitus with diabetic chronic kidney disease: Secondary | ICD-10-CM | POA: Diagnosis not present

## 2018-02-03 NOTE — Telephone Encounter (Signed)
Spoke to patient's daughter and she states patient is doing well.

## 2018-02-05 ENCOUNTER — Ambulatory Visit
Admission: RE | Admit: 2018-02-05 | Discharge: 2018-02-05 | Disposition: A | Discharge status: Home / Self Care | Payer: Medicare Other | Source: Ambulatory Visit | Attending: Physician Assistant | Admitting: Physician Assistant

## 2018-02-05 DIAGNOSIS — M7989 Other specified soft tissue disorders: Secondary | ICD-10-CM | POA: Insufficient documentation

## 2018-02-05 DIAGNOSIS — Z981 Arthrodesis status: Secondary | ICD-10-CM | POA: Diagnosis not present

## 2018-02-05 DIAGNOSIS — L97526 Non-pressure chronic ulcer of other part of left foot with bone involvement without evidence of necrosis: Secondary | ICD-10-CM | POA: Insufficient documentation

## 2018-02-05 DIAGNOSIS — R600 Localized edema: Secondary | ICD-10-CM | POA: Diagnosis not present

## 2018-02-05 DIAGNOSIS — E11621 Type 2 diabetes mellitus with foot ulcer: Secondary | ICD-10-CM | POA: Insufficient documentation

## 2018-02-05 DIAGNOSIS — N319 Neuromuscular dysfunction of bladder, unspecified: Secondary | ICD-10-CM | POA: Diagnosis not present

## 2018-02-05 DIAGNOSIS — N401 Enlarged prostate with lower urinary tract symptoms: Secondary | ICD-10-CM | POA: Diagnosis not present

## 2018-02-05 DIAGNOSIS — E1122 Type 2 diabetes mellitus with diabetic chronic kidney disease: Secondary | ICD-10-CM | POA: Diagnosis not present

## 2018-02-05 DIAGNOSIS — L97509 Non-pressure chronic ulcer of other part of unspecified foot with unspecified severity: Secondary | ICD-10-CM

## 2018-02-08 ENCOUNTER — Encounter: Payer: Medicare Other | Attending: Physician Assistant | Admitting: Physician Assistant

## 2018-02-08 DIAGNOSIS — Z8249 Family history of ischemic heart disease and other diseases of the circulatory system: Secondary | ICD-10-CM | POA: Insufficient documentation

## 2018-02-08 DIAGNOSIS — H409 Unspecified glaucoma: Secondary | ICD-10-CM | POA: Diagnosis not present

## 2018-02-08 DIAGNOSIS — I890 Lymphedema, not elsewhere classified: Secondary | ICD-10-CM | POA: Insufficient documentation

## 2018-02-08 DIAGNOSIS — L97526 Non-pressure chronic ulcer of other part of left foot with bone involvement without evidence of necrosis: Secondary | ICD-10-CM | POA: Insufficient documentation

## 2018-02-08 DIAGNOSIS — Z981 Arthrodesis status: Secondary | ICD-10-CM | POA: Insufficient documentation

## 2018-02-08 DIAGNOSIS — Z833 Family history of diabetes mellitus: Secondary | ICD-10-CM | POA: Insufficient documentation

## 2018-02-08 DIAGNOSIS — I509 Heart failure, unspecified: Secondary | ICD-10-CM | POA: Diagnosis not present

## 2018-02-08 DIAGNOSIS — M109 Gout, unspecified: Secondary | ICD-10-CM | POA: Insufficient documentation

## 2018-02-08 DIAGNOSIS — L97522 Non-pressure chronic ulcer of other part of left foot with fat layer exposed: Secondary | ICD-10-CM | POA: Diagnosis not present

## 2018-02-08 DIAGNOSIS — M1990 Unspecified osteoarthritis, unspecified site: Secondary | ICD-10-CM | POA: Insufficient documentation

## 2018-02-08 DIAGNOSIS — E11621 Type 2 diabetes mellitus with foot ulcer: Secondary | ICD-10-CM | POA: Diagnosis not present

## 2018-02-08 DIAGNOSIS — Z89511 Acquired absence of right leg below knee: Secondary | ICD-10-CM | POA: Insufficient documentation

## 2018-02-08 DIAGNOSIS — E1140 Type 2 diabetes mellitus with diabetic neuropathy, unspecified: Secondary | ICD-10-CM | POA: Diagnosis not present

## 2018-02-08 DIAGNOSIS — I110 Hypertensive heart disease with heart failure: Secondary | ICD-10-CM | POA: Insufficient documentation

## 2018-02-11 DIAGNOSIS — E1122 Type 2 diabetes mellitus with diabetic chronic kidney disease: Secondary | ICD-10-CM | POA: Diagnosis not present

## 2018-02-11 DIAGNOSIS — N401 Enlarged prostate with lower urinary tract symptoms: Secondary | ICD-10-CM | POA: Diagnosis not present

## 2018-02-11 DIAGNOSIS — N319 Neuromuscular dysfunction of bladder, unspecified: Secondary | ICD-10-CM | POA: Diagnosis not present

## 2018-02-11 NOTE — Progress Notes (Signed)
MITHCELL, SCHUMPERT ([id number]) Visit Report for 02/08/2018 Arrival Information Details Patient Name: Miguel Torres, Miguel Torres Date of Service: 02/08/2018 9:30 AM Medical Record Number: [id number] Patient Account Number: [id number] Date of Birth/Sex: 1930/06/26 (84 y.o. M) Treating RN: Roger Shelter Primary Care Trinita Devlin: Lelon Huh Other Clinician: Referring Danh Bayus: Lelon Huh Treating Kloey Cazarez/Extender: Melburn Hake, HOYT Weeks in Treatment: 2 Visit Information History Since Last Visit Added or deleted any medications: No Patient Arrived: Wheel Chair Any new allergies or adverse reactions: No Arrival Time: 09:33 Had a fall or experienced change in No Accompanied By: daughter activities of daily living that may affect Transfer Assistance: None risk of falls: Patient Identification Verified: Yes Signs or symptoms of abuse/neglect since last visito No Secondary Verification Process Yes Hospitalized since last visit: No Completed: Implantable device outside of the clinic excluding No Patient Has Alerts: Yes cellular tissue based products placed in the center Patient Alerts: Patient on Blood since last visit: Thinner Has Dressing in Place as Prescribed: Yes Eliquis Pain Present Now: No Electronic Signature(s) Signed: 02/08/2018 11:56:54 AM By: Lorine Bears RCP, RRT, CHT Entered By: Lorine Bears on 02/08/2018 09:38:40 Miguel Torres ([id number]) -------------------------------------------------------------------------------- Clinic Level of Care Assessment Details Patient Name: Miguel Torres Date of Service: 02/08/2018 9:30 AM Medical Record Number: [id number] Patient Account Number: [id number] Date of Birth/Sex: [birth year] (84 y.o. M) Treating RN: Roger Shelter Primary Care Lakendra Helling: Lelon Huh Other Clinician: Referring Jehad Bisono: Lelon Huh Treating Jorie Zee/Extender: Melburn Hake, HOYT Weeks in Treatment: 2 Clinic  Level of Care Assessment Items TOOL 4 Quantity Score X - Use when only an EandM is performed on FOLLOW-UP visit 1 0 ASSESSMENTS - Nursing Assessment / Reassessment X - Reassessment of Co-morbidities (includes updates in patient status) 1 10 X- 1 5 Reassessment of Adherence to Treatment Plan ASSESSMENTS - Wound and Skin Assessment / Reassessment X - Simple Wound Assessment / Reassessment - one wound 1 5 []  - 0 Complex Wound Assessment / Reassessment - multiple wounds []  - 0 Dermatologic / Skin Assessment (not related to wound area) ASSESSMENTS - Focused Assessment []  - Circumferential Edema Measurements - multi extremities 0 []  - 0 Nutritional Assessment / Counseling / Intervention []  - 0 Lower Extremity Assessment (monofilament, tuning fork, pulses) []  - 0 Peripheral Arterial Disease Assessment (using hand held doppler) ASSESSMENTS - Ostomy and/or Continence Assessment and Care []  - Incontinence Assessment and Management 0 []  - 0 Ostomy Care Assessment and Management (repouching, etc.) PROCESS - Coordination of Care X - Simple Patient / Family Education for ongoing care 1 15 []  - 0 Complex (extensive) Patient / Family Education for ongoing care []  - 0 Staff obtains Programmer, systems, Records, Test Results / Process Orders []  - 0 Staff telephones HHA, Nursing Homes / Clarify orders / etc []  - 0 Routine Transfer to another Facility (non-emergent condition) []  - 0 Routine Hospital Admission (non-emergent condition) []  - 0 New Admissions / Biomedical engineer / Ordering NPWT, Apligraf, etc. []  - 0 Emergency Hospital Admission (emergent condition) X- 1 10 Simple Discharge Coordination Miguel Torres, Miguel Torres ([id number]) []  - 0 Complex (extensive) Discharge Coordination PROCESS - Special Needs []  - Pediatric / Minor Patient Management 0 []  - 0 Isolation Patient Management []  - 0 Hearing / Language / Visual special needs []  - 0 Assessment of Community assistance  (transportation, D/C planning, etc.) []  - 0 Additional assistance / Altered mentation []  - 0 Support Surface(s) Assessment (bed, cushion, seat, etc.) INTERVENTIONS - Wound Cleansing / Measurement X - Simple Wound Cleansing -  one wound 1 5 []  - 0 Complex Wound Cleansing - multiple wounds []  - 0 Wound Imaging (photographs - any number of wounds) []  - 0 Wound Tracing (instead of photographs) X- 1 5 Simple Wound Measurement - one wound []  - 0 Complex Wound Measurement - multiple wounds INTERVENTIONS - Wound Dressings X - Small Wound Dressing one or multiple wounds 1 10 []  - 0 Medium Wound Dressing one or multiple wounds []  - 0 Large Wound Dressing one or multiple wounds []  - 0 Application of Medications - topical []  - 0 Application of Medications - injection INTERVENTIONS - Miscellaneous []  - External ear exam 0 []  - 0 Specimen Collection (cultures, biopsies, blood, body fluids, etc.) []  - 0 Specimen(s) / Culture(s) sent or taken to Lab for analysis []  - 0 Patient Transfer (multiple staff / Civil Service fast streamer / Similar devices) []  - 0 Simple Staple / Suture removal (25 or less) []  - 0 Complex Staple / Suture removal (26 or more) []  - 0 Hypo / Hyperglycemic Management (close monitor of Blood Glucose) []  - 0 Ankle / Brachial Index (ABI) - do not check if billed separately X- 1 5 Vital Signs Miguel Torres, Miguel Torres ([id number]) Has the patient been seen at the hospital within the last three years: Yes Total Score: 70 Level Of Care: New/Established - Level 2 Electronic Signature(s) Signed: 02/08/2018 4:35:19 PM By: Roger Shelter Entered By: Roger Shelter on 02/08/2018 10:12:51 Miguel Torres ([id number]) -------------------------------------------------------------------------------- Encounter Discharge Information Details Patient Name: Miguel Torres Date of Service: 02/08/2018 9:30 AM Medical Record Number: [id number] Patient Account Number: [id number] Date of  Birth/Sex: [birth year] (84 y.o. M) Treating RN: Montey Hora Primary Care Kelli Egolf: Lelon Huh Other Clinician: Referring Prabhleen Montemayor: Lelon Huh Treating Awanda Wilcock/Extender: Melburn Hake, HOYT Weeks in Treatment: 2 Encounter Discharge Information Items Discharge Condition: Stable Ambulatory Status: Wheelchair Discharge Destination: Skilled Nursing Facility Telephoned: No Orders Sent: Yes Transportation: Private Auto Accompanied By: daughter Schedule Follow-up Appointment: Yes Clinical Summary of Care: Electronic Signature(s) Signed: 02/08/2018 4:37:12 PM By: Montey Hora Entered By: Montey Hora on 02/08/2018 10:22:28 Miguel Torres ([id number]) -------------------------------------------------------------------------------- Lower Extremity Assessment Details Patient Name: Miguel Torres Date of Service: 02/08/2018 9:30 AM Medical Record Number: [id number] Patient Account Number: [id number] Date of Birth/Sex: Jan 22, 1931 (84 y.o. M) Treating RN: Montey Hora Primary Care Joby Hershkowitz: Lelon Huh Other Clinician: Referring Davion Flannery: Lelon Huh Treating Elliott Quade/Extender: Melburn Hake, HOYT Weeks in Treatment: 2 Edema Assessment Assessed: [Left: No] [Right: No] Edema: [Left: N] [Right: o] Calf Left: Right: Point of Measurement: 31 cm From Medial Instep 40 cm cm Ankle Left: Right: Point of Measurement: 12 cm From Medial Instep 24 cm cm Vascular Assessment Claudication: Claudication Assessment [Left:None] Pulses: Dorsalis Pedis Palpable: [Left:Yes] Posterior Tibial Extremity colors, hair growth, and conditions: Extremity Color: [Left:Red] Hair Growth on Extremity: [Left:No] Temperature of Extremity: [Left:Warm] Capillary Refill: [Left:< 3 seconds] Toe Nail Assessment Left: Right: Thick: Yes Discolored: Yes Deformed: Yes Improper Length and Hygiene: Yes Electronic Signature(s) Signed: 02/08/2018 4:37:12 PM By: Montey Hora Entered By: Montey Hora on 02/08/2018 09:48:46 Miguel Torres ([id number]) -------------------------------------------------------------------------------- Multi Wound Chart Details Patient Name: Miguel Torres Date of Service: 02/08/2018 9:30 AM Medical Record Number: [id number] Patient Account Number: [id number] Date of Birth/Sex: 01/15/1931 (84 y.o. M) Treating RN: Roger Shelter Primary Care Dakota Stangl: Lelon Huh Other Clinician: Referring Marylynn Rigdon: Lelon Huh Treating Quavis Klutz/Extender: Melburn Hake, HOYT Weeks in Treatment: 2 Vital Signs Height(in): 68 Pulse(bpm): 102 Weight(lbs): 216 Blood Pressure(mmHg): 171/66 Body Mass Index(BMI): 33 Temperature(F): 98.3 Respiratory  Rate 18 (breaths/min): Photos: [N/A:N/A] Wound Location: Left Foot - Lateral N/A N/A Wounding Event: Gradually Appeared N/A N/A Primary Etiology: Diabetic Wound/Ulcer of the N/A N/A Lower Extremity Comorbid History: Glaucoma, Congestive Heart N/A N/A Failure, Hypertension, Type II Diabetes, Gout, Osteoarthritis, Neuropathy Date Acquired: 11/17/2017 N/A N/A Weeks of Treatment: 2 N/A N/A Wound Status: Open N/A N/A Measurements L x W x D 3x0.7x0.2 N/A N/A (cm) Area (cm) : 1.649 N/A N/A Volume (cm) : 0.33 N/A N/A % Reduction in Area: 25.00% N/A N/A % Reduction in Volume: 85.00% N/A N/A Classification: Grade 2 N/A N/A Exudate Amount: Small N/A N/A Exudate Type: Serous N/A N/A Exudate Color: amber N/A N/A Wound Margin: Indistinct, nonvisible N/A N/A Granulation Amount: Small (1-33%) N/A N/A Granulation Quality: Pink N/A N/A Necrotic Amount: Small (1-33%) N/A N/A Exposed Structures: Fat Layer (Subcutaneous N/A N/A Tissue) Exposed: Yes Fascia: No Tendon: No Miguel Torres, SUR. ([id number]) Muscle: No Joint: No Bone: No Epithelialization: Large (67-100%) N/A N/A Periwound Skin Texture: Induration: Yes N/A N/A Excoriation: No Callus: No Crepitus: No Rash: No Scarring: No Periwound Skin  Moisture: Maceration: Yes N/A N/A Dry/Scaly: No Periwound Skin Color: Atrophie Blanche: No N/A N/A Cyanosis: No Ecchymosis: No Erythema: No Hemosiderin Staining: No Mottled: No Pallor: No Rubor: No Temperature: No Abnormality N/A N/A Tenderness on Palpation: Yes N/A N/A Wound Preparation: Ulcer Cleansing: N/A N/A Rinsed/Irrigated with Saline, Other: soap and water Topical Anesthetic Applied: Other: lidocaine 4% Treatment Notes Electronic Signature(s) Signed: 02/08/2018 4:35:19 PM By: Roger Shelter Entered By: Roger Shelter on 02/08/2018 10:09:03 Miguel Torres ([id number]) -------------------------------------------------------------------------------- [place] Details Patient Name: Miguel Torres Date of Service: 02/08/2018 9:30 AM Medical Record Number: [id number] Patient Account Number: [id number] Date of Birth/Sex: [birth year] (84 y.o. M) Treating RN: Roger Shelter Primary Care Bomani Oommen: Lelon Huh Other Clinician: Referring Berkley Cronkright: Lelon Huh Treating Aziel Morgan/Extender: Melburn Hake, HOYT Weeks in Treatment: 2 Active Inactive ` Abuse / Safety / Falls / Self Care Management Nursing Diagnoses: Potential for falls Goals: Patient will remain injury free related to falls Date Initiated: 01/19/2018 Target Resolution Date: 04/02/2018 Goal Status: Active Interventions: Assess fall risk on admission and as needed Notes: ` Orientation to the Wound Care Program Nursing Diagnoses: Knowledge deficit related to the wound healing center program Goals: Patient/caregiver will verbalize understanding of the [place] Date Initiated: 01/19/2018 Target Resolution Date: 04/02/2018 Goal Status: Active Interventions: Provide education on orientation to the wound center Notes: ` Wound/Skin Impairment Nursing Diagnoses: Impaired tissue integrity Goals: Ulcer/skin breakdown will heal within 14 weeks Date  Initiated: 01/19/2018 Target Resolution Date: 04/02/2018 Goal Status: Active Interventions: Miguel Torres, Miguel Torres ([id number]) Assess patient/caregiver ability to obtain necessary supplies Assess patient/caregiver ability to perform ulcer/skin care regimen upon admission and as needed Assess ulceration(s) every visit Notes: Electronic Signature(s) Signed: 02/08/2018 4:35:19 PM By: Roger Shelter Entered By: Roger Shelter on 02/08/2018 10:08:28 Miguel Torres ([id number]) -------------------------------------------------------------------------------- Pain Assessment Details Patient Name: Miguel Torres Date of Service: 02/08/2018 9:30 AM Medical Record Number: [id number] Patient Account Number: [id number] Date of Birth/Sex: [birth year] (84 y.o. M) Treating RN: Roger Shelter Primary Care Jasiya Markie: Lelon Huh Other Clinician: Referring Twania Bujak: Lelon Huh Treating Ariba Lehnen/Extender: Melburn Hake, HOYT Weeks in Treatment: 2 Active Problems Location of Pain Severity and Description of Pain Patient Has Paino No Site Locations Pain Management and Medication Current Pain Management: Electronic Signature(s) Signed: 02/08/2018 11:56:54 AM By: Lorine Bears RCP, RRT, CHT Signed: 02/08/2018 4:35:19 PM By: Roger Shelter Entered By: Lorine Bears on  02/08/2018 09:38:49 Miguel Torres ([id number]) -------------------------------------------------------------------------------- Patient/Caregiver Education Details Patient Name: Miguel Torres Date of Service: 02/08/2018 9:30 AM Medical Record Number: [id number] Patient Account Number: [id number] Date of Birth/Gender: Sep 07, 1930 (84 y.o. M) Treating RN: Roger Shelter Primary Care Physician: Lelon Huh Other Clinician: Referring Physician: Lelon Huh Treating Physician/Extender: Sharalyn Ink in Treatment: 2 Education Assessment Education Provided  To: Patient Education Topics Provided Wound/Skin Impairment: Handouts: Caring for Your Ulcer Methods: Explain/Verbal Responses: State content correctly Electronic Signature(s) Signed: 02/08/2018 4:35:19 PM By: Roger Shelter Entered By: Roger Shelter on 02/08/2018 10:13:08 Miguel Torres ([id number]) -------------------------------------------------------------------------------- Wound Assessment Details Patient Name: Miguel Torres Date of Service: 02/08/2018 9:30 AM Medical Record Number: [id number] Patient Account Number: [id number] Date of Birth/Sex: 15-Mar-1931 (84 y.o. M) Treating RN: Montey Hora Primary Care Miquela Costabile: Lelon Huh Other Clinician: Referring Kimberle Stanfill: Lelon Huh Treating Otilio Groleau/Extender: Melburn Hake, HOYT Weeks in Treatment: 2 Wound Status Wound Number: 1 Primary Diabetic Wound/Ulcer of the Lower Extremity Etiology: Wound Location: Left Foot - Lateral Wound Open Wounding Event: Gradually Appeared Status: Date Acquired: 11/17/2017 Comorbid Glaucoma, Congestive Heart Failure, Weeks Of Treatment: 2 History: Hypertension, Type II Diabetes, Gout, Clustered Wound: No Osteoarthritis, Neuropathy Photos Photo Uploaded By: Secundino Ginger on 02/08/2018 09:58:32 Wound Measurements Length: (cm) 3 Width: (cm) 0.7 Depth: (cm) 0.2 Area: (cm) 1.649 Volume: (cm) 0.33 % Reduction in Area: 25% % Reduction in Volume: 85% Epithelialization: Large (67-100%) Tunneling: No Undermining: No Wound Description Classification: Grade 2 Foul Odor A Wound Margin: Indistinct, nonvisible Slough/Fibr Exudate Amount: Small Exudate Type: Serous Exudate Color: amber fter Cleansing: No ino Yes Wound Bed Granulation Amount: Small (1-33%) Exposed Structure Granulation Quality: Pink Fascia Exposed: No Necrotic Amount: Small (1-33%) Fat Layer (Subcutaneous Tissue) Exposed: Yes Necrotic Quality: Adherent Slough Tendon Exposed: No Muscle Exposed: No Joint  Exposed: No Bone Exposed: No Periwound Skin Texture Miguel Torres, GLACE. ([id number]) Texture Color No Abnormalities Noted: No No Abnormalities Noted: No Callus: No Atrophie Blanche: No Crepitus: No Cyanosis: No Excoriation: No Ecchymosis: No Induration: Yes Erythema: No Rash: No Hemosiderin Staining: No Scarring: No Mottled: No Pallor: No Moisture Rubor: No No Abnormalities Noted: No Dry / Scaly: No Temperature / Pain Maceration: Yes Temperature: No Abnormality Tenderness on Palpation: Yes Wound Preparation Ulcer Cleansing: Rinsed/Irrigated with Saline, Other: soap and water, Topical Anesthetic Applied: Other: lidocaine 4%, Treatment Notes Wound #1 (Left, Lateral Foot) 1. Cleansed with: Cleanse wound with antibacterial soap and water 2. Anesthetic Topical Lidocaine 4% cream to wound bed prior to debridement 3. Peri-wound Care: Moisturizing lotion 4. Dressing Applied: Calcium Alginate with Silver Prisma Ag 7. Secured with Other (specify in notes) Notes kerlix and coban to secure; Prism on foot, Silverell on top of prisma. Electronic Signature(s) Signed: 02/08/2018 4:37:12 PM By: Montey Hora Entered By: Montey Hora on 02/08/2018 09:53:46 Miguel Torres ([id number]) -------------------------------------------------------------------------------- [place] Details Patient Name: Miguel Torres Date of Service: 02/08/2018 9:30 AM Medical Record Number: [id number] Patient Account Number: [id number] Date of Birth/Sex: 1931-03-11 (84 y.o. M) Treating RN: Roger Shelter Primary Care Gabreille Dardis: Lelon Huh Other Clinician: Referring Analyn Matusek: Lelon Huh Treating Arshad Oberholzer/Extender: Melburn Hake, HOYT Weeks in Treatment: 2 Vital Signs Time Taken: 09:38 Temperature (F): 98.3 Height (in): 68 Pulse (bpm): 102 Weight (lbs): 216 Respiratory Rate (breaths/min): 18 Body Mass Index (BMI): 32.8 Blood Pressure (mmHg): 171/66 Reference Range: 80 - 120 mg  / dl Electronic Signature(s) Signed: 02/08/2018 11:56:54 AM By: Lorine Bears RCP, RRT, CHT Entered By: Becky Sax, Amado Nash on 02/08/2018 09:41:22

## 2018-02-11 NOTE — Progress Notes (Signed)
[name], [name] ([id number]) Visit Report for 02/08/2018 Chief Complaint Document Details Patient Name: Miguel Torres Date of Service: 02/08/2018 9:30 AM Medical Record Number: [id number] Patient Account Number: [id number] Date of Birth/Sex: 1931-04-10 (84 y.o. M) Treating RN: Roger Shelter Primary Care Provider: Lelon Huh Other Clinician: Referring Provider: Lelon Huh Treating Provider/Extender: Melburn Hake, HOYT Weeks in Treatment: 2 Information Obtained from: Patient Chief Complaint Left foot ulcer Electronic Signature(s) Signed: 02/08/2018 1:33:13 PM By: Worthy Keeler PA-C Entered By: Worthy Keeler on 02/08/2018 09:47:58 Miguel Torres ([id number]) -------------------------------------------------------------------------------- HPI Details Patient Name: Miguel Torres Date of Service: 02/08/2018 9:30 AM Medical Record Number: [id number] Patient Account Number: [id number] Date of Birth/Sex: 24-May-1930 (84 y.o. M) Treating RN: Roger Shelter Primary Care Provider: Lelon Huh Other Clinician: Referring Provider: Lelon Huh Treating Provider/Extender: Melburn Hake, HOYT Weeks in Treatment: 2 History of Present Illness Associated Signs and Symptoms: Patient has a history of diabetes mellitus type II, lymphedema, a right below knee amputation which was performed 10 years ago, and proof of vascular disease which is managed by [facility], Dr. dew HPI Description: 01/19/18 on evaluation today patient presents for initial evaluation and clinic due to issues that he has been having with a left lateral foot ulcer which has been present for at minimum two months although this may have actually been longer before the family knew of the wound. Currently he has been seen [facility] where they have performed arterial studies. I did have those for review today which did show that he has mild left lower extremity arterial disease  with a abnormal left toe brachial index. His ABI was 0.93 with a TBI of 0.26. Upon further evaluation it appears that the patient actually was recommended to have an angiogram by Dr. dew. This was due to the fact that there was concerned about the low TBI and the fact that he may not be perfusion all the way down into his foot. With that being said the final decision as to whether or not to proceed with this has not been made as of yet according to the patient's daughter who was present for evaluation at this time today. The patient has no evidence of infection at this time. With that being said there is bone exposed in the base of the wound unfortunately. This obviously may indicate a more significant issue underlying just the open wound. Other than that he actually appears to have a significant amount of epithelialization noted which is excellent news. No fevers, chills, nausea, or vomiting noted at this time. He has had an x-ray which revealed no evidence of osteomyelitis this was performed July 2019. No MRI has been performed at this point. 01/28/18 on evaluation today patient actually appears to be doing about the same in regard to his left lateral foot ulcer. He does not have as much is the way of necrotic tissue overlying the surface of the wound in fact no sharp debridement was necessary. With that being said he does have some maceration noted. He may benefit from adding alginate over the Prisma along the lateral aspect of his wound. In regard to the Kerlex and Coban wrap it appears that this actually was not wrapped as high as it should've been and therefore the patient felt blisters on the lateral portion of his upper left lower extremity. 02/08/18 on evaluation today patient presents today for follow-up after having had his MRI which was performed on 02/05/18. This MRI showed that  there did not appear to be any evidence of osteomyelitis of the left foot or ankle at this time. He did have a  prior midfoot arthrodesis with solid osseous fusion. Nonetheless in general the patient seems to be doing excellent at this point in time which is great news. Overall very pleased with the fact there is no infection and the fact that his wound actually seems to be doing better. Electronic Signature(s) Signed: 02/08/2018 1:33:13 PM By: Worthy Keeler PA-C Entered By: Worthy Keeler on 02/08/2018 10:29:10 Miguel Torres ([id number]) -------------------------------------------------------------------------------- Physical Exam Details Patient Name: Miguel Torres Date of Service: 02/08/2018 9:30 AM Medical Record Number: [id number] Patient Account Number: [id number] Date of Birth/Sex: 05-05-1931 (84 y.o. M) Treating RN: Roger Shelter Primary Care Provider: Lelon Huh Other Clinician: Referring Provider: Lelon Huh Treating Provider/Extender: Melburn Hake, HOYT Weeks in Treatment: 2 Constitutional Well-nourished and well-hydrated in no acute distress. Respiratory normal breathing without difficulty. clear to auscultation bilaterally. Cardiovascular regular rate and rhythm with normal S1, S2. Psychiatric this patient is able to make decisions and demonstrates good insight into disease process. Alert and Oriented x 3. pleasant and cooperative. Notes At this time patient's wound bed actually show signs of good epithelialization there's no evidence of the need for debridement at this time he does still have lower extremity edema unfortunately. In general I feel like heos made great progress. Electronic Signature(s) Signed: 02/08/2018 1:33:13 PM By: Worthy Keeler PA-C Entered By: Worthy Keeler on 02/08/2018 10:29:56 Miguel Torres ([id number]) -------------------------------------------------------------------------------- Physician Orders Details Patient Name: Miguel Torres Date of Service: 02/08/2018 9:30 AM Medical Record Number: [id number] Patient  Account Number: [id number] Date of Birth/Sex: Apr 12, 1931 (84 y.o. M) Treating RN: Roger Shelter Primary Care Provider: Lelon Huh Other Clinician: Referring Provider: Lelon Huh Treating Provider/Extender: Melburn Hake, HOYT Weeks in Treatment: 2 Verbal / Phone Orders: No Diagnosis Coding ICD-10 Coding Code Description E11.621 Type 2 diabetes mellitus with foot ulcer L97.526 Non-pressure chronic ulcer of other part of left foot with bone involvement without evidence of necrosis I89.0 Lymphedema, not elsewhere classified I73.89 Other specified peripheral vascular diseases Z89.511 Acquired absence of right leg below knee Wound Cleansing Wound #1 Left,Lateral Foot o Cleanse wound with mild soap and water Anesthetic (add to Medication List) Wound #1 Left,Lateral Foot o Topical Lidocaine 4% cream applied to wound bed prior to debridement (In Clinic Only). Primary Wound Dressing Wound #1 Left,Lateral Foot o Silver Collagen Secondary Dressing Wound #1 Left,Lateral Foot o ABD pad o Other - place silvercell over prisma on lateral area of foot and place silvercell over blistered/ red areas on medial lower leg. Dressing Change Frequency Wound #1 Left,Lateral Foot o Change Dressing Monday, Wednesday, Friday Follow-up Appointments Wound #1 Left,Lateral Foot o Return Appointment in 1 week. Edema Control Wound #1 Left,Lateral Foot o Kerlix and Coban - Left Lower Extremity - DO NOT WRAP TOO TIGHT.******* make sure the wrap starts 2 finger widths below the toes and stops 3 finger widths below the knee.******** Miguel Torres ([id number]) Electronic Signature(s) Signed: 02/08/2018 1:33:13 PM By: Worthy Keeler PA-C Signed: 02/08/2018 4:35:19 PM By: Roger Shelter Entered By: Roger Shelter on 02/08/2018 10:12:11 Miguel Torres ([id number]) -------------------------------------------------------------------------------- Problem List Details Patient Name:  Miguel Torres Date of Service: 02/08/2018 9:30 AM Medical Record Number: [id number] Patient Account Number: [id number] Date of Birth/Sex: [birth year] (84 y.o. M) Treating RN: Roger Shelter Primary Care Provider: Lelon Huh Other Clinician: Referring Provider: Lelon Huh Treating Provider/Extender: Melburn Hake, HOYT  Weeks in Treatment: 2 Active Problems ICD-10 Evaluated Encounter Code Description Active Date Today Diagnosis E11.621 Type 2 diabetes mellitus with foot ulcer 01/19/2018 No Yes L97.526 Non-pressure chronic ulcer of other part of left foot with bone 01/19/2018 No Yes involvement without evidence of necrosis I89.0 Lymphedema, not elsewhere classified 01/19/2018 No Yes I73.89 Other specified peripheral vascular diseases 01/19/2018 No Yes Z89.511 Acquired absence of right leg below knee 01/19/2018 No Yes Inactive Problems Resolved Problems Electronic Signature(s) Signed: 02/08/2018 1:33:13 PM By: Worthy Keeler PA-C Entered By: Worthy Keeler on 02/08/2018 09:47:49 Miguel Torres ([id number]) -------------------------------------------------------------------------------- Progress Note Details Patient Name: Miguel Torres Date of Service: 02/08/2018 9:30 AM Medical Record Number: [id number] Patient Account Number: [id number] Date of Birth/Sex: 11-04-1930 (84 y.o. M) Treating RN: Roger Shelter Primary Care Provider: Lelon Huh Other Clinician: Referring Provider: Lelon Huh Treating Provider/Extender: Melburn Hake, HOYT Weeks in Treatment: 2 Subjective Chief Complaint Information obtained from Patient Left foot ulcer History of Present Illness (HPI) The following HPI elements were documented for the patient's wound: Associated Signs and Symptoms: Patient has a history of diabetes mellitus type II, lymphedema, a right below knee amputation which was performed 10 years ago, and proof of vascular disease which is managed by [facility], Dr. dew 01/19/18 on evaluation today patient presents for initial evaluation and clinic due to issues that he has been having with a left lateral foot ulcer which has been present for at minimum two months although this may have actually been longer before the family knew of the wound. Currently he has been seen [facility] where they have performed arterial studies. I did have those for review today which did show that he has mild left lower extremity arterial disease with a abnormal left toe brachial index. His ABI was 0.93 with a TBI of 0.26. Upon further evaluation it appears that the patient actually was recommended to have an angiogram by Dr. dew. This was due to the fact that there was concerned about the low TBI and the fact that he may not be perfusion all the way down into his foot. With that being said the final decision as to whether or not to proceed with this has not been made as of yet according to the patient's daughter who was present for evaluation at this time today. The patient has no evidence of infection at this time. With that being said there is bone exposed in the base of the wound unfortunately. This obviously may indicate a more significant issue underlying just the open wound. Other than that he actually appears to have a significant amount of epithelialization noted which is excellent news. No fevers, chills, nausea, or vomiting noted at this time. He has had an x-ray which revealed no evidence of osteomyelitis this was performed July 2019. No MRI has been performed at this point. 01/28/18 on evaluation today patient actually appears to be doing about the same in regard to his left lateral foot ulcer. He does not have as much is the way of necrotic tissue overlying the surface of the wound in fact no sharp debridement was necessary. With that being said he does have some maceration noted. He may benefit from adding alginate over the  Prisma along the lateral aspect of his wound. In regard to the Kerlex and Coban wrap it appears that this actually was not wrapped as high as it should've been and therefore the patient felt blisters on the lateral portion of  his upper left lower extremity. 02/08/18 on evaluation today patient presents today for follow-up after having had his MRI which was performed on 02/05/18. This MRI showed that there did not appear to be any evidence of osteomyelitis of the left foot or ankle at this time. He did have a prior midfoot arthrodesis with solid osseous fusion. Nonetheless in general the patient seems to be doing excellent at this point in time which is great news. Overall very pleased with the fact there is no infection and the fact that his wound actually seems to be doing better. Patient History Information obtained from Patient. Family History Diabetes - Mother, Heart Disease - Mother, Hypertension - Mother, No family history of Kidney Disease, Lung Disease, Seizures, Stroke, Thyroid Problems, Tuberculosis. Social History Never smoker, Marital Status - Married, Alcohol Use - Never, Drug Use - No History, Caffeine Use - Daily. Miguel Torres, Miguel Torres ([id number]) Review of Systems (ROS) Constitutional Symptoms (General Health) Denies complaints or symptoms of Fever, Chills. Respiratory The patient has no complaints or symptoms. Cardiovascular Complains or has symptoms of LE edema. Psychiatric The patient has no complaints or symptoms. Objective Constitutional Well-nourished and well-hydrated in no acute distress. Vitals Time Taken: 9:38 AM, Height: 68 in, Weight: 216 lbs, BMI: 32.8, Temperature: 98.3 F, Pulse: 102 bpm, Respiratory Rate: 18 breaths/min, Blood Pressure: 171/66 mmHg. Respiratory normal breathing without difficulty. clear to auscultation bilaterally. Cardiovascular regular rate and rhythm with normal S1, S2. Psychiatric this patient is able to make decisions and  demonstrates good insight into disease process. Alert and Oriented x 3. pleasant and cooperative. General Notes: At this time patient's wound bed actually show signs of good epithelialization there's no evidence of the need for debridement at this time he does still have lower extremity edema unfortunately. In general I feel like he s made great progress. Integumentary (Hair, Skin) Wound #1 status is Open. Original cause of wound was Gradually Appeared. The wound is located on the Left,Lateral Foot. The wound measures 3cm length x 0.7cm width x 0.2cm depth; 1.649cm^2 area and 0.33cm^3 volume. There is Fat Layer (Subcutaneous Tissue) Exposed exposed. There is no tunneling or undermining noted. There is a small amount of serous drainage noted. The wound margin is indistinct and nonvisible. There is small (1-33%) pink granulation within the wound bed. There is a small (1-33%) amount of necrotic tissue within the wound bed including Adherent Slough. The periwound skin appearance exhibited: Induration, Maceration. The periwound skin appearance did not exhibit: Callus, Crepitus, Excoriation, Rash, Scarring, Dry/Scaly, Atrophie Blanche, Cyanosis, Ecchymosis, Hemosiderin Staining, Mottled, Pallor, Rubor, Erythema. Periwound temperature was noted as No Abnormality. The periwound has tenderness on palpation. Assessment Miguel Torres, Miguel Torres ([id number]) Active Problems ICD-10 Type 2 diabetes mellitus with foot ulcer Non-pressure chronic ulcer of other part of left foot with bone involvement without evidence of necrosis Lymphedema, not elsewhere classified Other specified peripheral vascular diseases Acquired absence of right leg below knee Plan Wound Cleansing: Wound #1 Left,Lateral Foot: Cleanse wound with mild soap and water Anesthetic (add to Medication List): Wound #1 Left,Lateral Foot: Topical Lidocaine 4% cream applied to wound bed prior to debridement (In Clinic Only). Primary Wound  Dressing: Wound #1 Left,Lateral Foot: Silver Collagen Secondary Dressing: Wound #1 Left,Lateral Foot: ABD pad Other - place silvercell over prisma on lateral area of foot and place silvercell over blistered/ red areas on medial lower leg. Dressing Change Frequency: Wound #1 Left,Lateral Foot: Change Dressing Monday, Wednesday, Friday Follow-up Appointments: Wound #1 Left,Lateral Foot: Return Appointment in 1  week. Edema Control: Wound #1 Left,Lateral Foot: Kerlix and Coban - Left Lower Extremity - DO NOT WRAP TOO TIGHT.******* make sure the wrap starts 2 finger widths below the toes and stops 3 finger widths below the knee.******** I'm gonna suggest currently that we continue with the above wound care measures for the next two weeks. Time. The patient is in agreement that plan. If anything changes or worsens meantime he will let me know. Please see above for specific wound care orders. We will see patient for re-evaluation in 2 week(s) here in the clinic. If anything worsens or changes patient will contact our office for additional recommendations. Electronic Signature(s) Signed: 02/08/2018 1:33:13 PM By: Worthy Keeler PA-C Entered By: Worthy Keeler on 02/08/2018 10:30:19 Miguel Torres ([id number]) -------------------------------------------------------------------------------- ROS/PFSH Details Patient Name: Miguel Torres Date of Service: 02/08/2018 9:30 AM Medical Record Number: [id number] Patient Account Number: [id number] Date of Birth/Sex: [birth year] (84 y.o. M) Treating RN: Roger Shelter Primary Care Provider: Lelon Huh Other Clinician: Referring Provider: Lelon Huh Treating Provider/Extender: Melburn Hake, HOYT Weeks in Treatment: 2 Information Obtained From Patient Wound History Do you currently have one or more open woundso Yes How many open wounds do you currently haveo 1 Approximately how long have you had your woundso 6 months How have you  been treating your wound(s) until nowo Santyl Has your wound(s) ever healed and then re-openedo No Have you had any lab work done in the past montho No Have you tested positive for an antibiotic resistant organism (MRSA, VRE)o No Have you tested positive for osteomyelitis (bone infection)o No Have you had any tests for circulation on your legso Yes Constitutional Symptoms (General Health) Complaints and Symptoms: Negative for: Fever; Chills Cardiovascular Complaints and Symptoms: Positive for: LE edema Medical History: Positive for: Congestive Heart Failure; Hypertension Negative for: Angina; Arrhythmia; Coronary Artery Disease; Deep Vein Thrombosis; Hypotension; Myocardial Infarction; Peripheral Arterial Disease; Peripheral Venous Disease; Phlebitis; Vasculitis Eyes Medical History: Positive for: Glaucoma Negative for: Cataracts; Optic Neuritis Ear/Nose/Mouth/Throat Medical History: Negative for: Chronic sinus problems/congestion; Middle ear problems Hematologic/Lymphatic Medical History: Negative for: Anemia; Hemophilia; Human Immunodeficiency Virus; Lymphedema; Sickle Cell Disease Respiratory Complaints and Symptoms: No Complaints or Symptoms Medical History: Miguel Torres, Miguel Torres ([id number]) Negative for: Aspiration; Asthma; Chronic Obstructive Pulmonary Disease (COPD); Pneumothorax; Sleep Apnea; Tuberculosis Gastrointestinal Medical History: Negative for: Cirrhosis ; Colitis; Crohnos; Hepatitis A; Hepatitis B; Hepatitis C Endocrine Medical History: Positive for: Type II Diabetes Negative for: Type I Diabetes Time with diabetes: 10 years Treated with: Oral agents Blood sugar tested every day: Yes Tested : Genitourinary Medical History: Negative for: End Stage Renal Disease Immunological Medical History: Negative for: Lupus Erythematosus; Raynaudos; Scleroderma Integumentary (Skin) Medical History: Negative for: History of Burn; History of pressure  wounds Musculoskeletal Medical History: Positive for: Gout; Osteoarthritis Negative for: Rheumatoid Arthritis; Osteomyelitis Neurologic Medical History: Positive for: Neuropathy Negative for: Dementia; Quadriplegia; Paraplegia; Seizure Disorder Oncologic Medical History: Negative for: Received Chemotherapy; Received Radiation Psychiatric Complaints and Symptoms: No Complaints or Symptoms Medical History: Negative for: Anorexia/bulimia; Confinement Anxiety HBO Extended History Items Miguel Torres, Miguel Torres ([id number]) Eyes: Glaucoma Immunizations Pneumococcal Vaccine: Received Pneumococcal Vaccination: Yes Implantable Devices Family and Social History Diabetes: Yes - Mother; Heart Disease: Yes - Mother; Hypertension: Yes - Mother; Kidney Disease: No; Lung Disease: No; Seizures: No; Stroke: No; Thyroid Problems: No; Tuberculosis: No; Never smoker; Marital Status - Married; Alcohol Use: Never; Drug Use: No History; Caffeine Use: Daily; Advanced Directives: Yes (Not Provided); Patient does not want information on  Advanced Directives; Living Will: Yes (Not Provided); Medical Power of Attorney: Yes (Copy provided) Physician Affirmation I have reviewed and agree with the above information. Electronic Signature(s) Signed: 02/08/2018 1:33:13 PM By: Worthy Keeler PA-C Signed: 02/08/2018 4:35:19 PM By: Roger Shelter Entered By: Worthy Keeler on 02/08/2018 10:29:40 Miguel Torres ([id number]) -------------------------------------------------------------------------------- SuperBill Details Patient Name: Miguel Torres Date of Service: 02/08/2018 Medical Record Number: [id number] Patient Account Number: [id number] Date of Birth/Sex: June 14, 1930 (84 y.o. M) Treating RN: Roger Shelter Primary Care Provider: Lelon Huh Other Clinician: Referring Provider: Lelon Huh Treating Provider/Extender: Melburn Hake, HOYT Weeks in Treatment: 2 Diagnosis Coding ICD-10  Codes Code Description E11.621 Type 2 diabetes mellitus with foot ulcer L97.526 Non-pressure chronic ulcer of other part of left foot with bone involvement without evidence of necrosis I89.0 Lymphedema, not elsewhere classified I73.89 Other specified peripheral vascular diseases Z89.511 Acquired absence of right leg below knee Facility Procedures CPT4 Code: [id number] Description: [phone] - WOUND CARE VISIT-LEV 2 EST PT Modifier: Quantity: 1 Physician Procedures CPT4: Description Modifier Quantity Code [id number] - WC PHYS LEVEL 3 - EST PT 1 ICD-10 Diagnosis Description E11.621 Type 2 diabetes mellitus with foot ulcer L97.526 Non-pressure chronic ulcer of other part of left foot with bone involvement without  evidence of necrosis I89.0 Lymphedema, not elsewhere classified I73.89 Other specified peripheral vascular diseases Electronic Signature(s) Signed: 02/08/2018 1:33:13 PM By: Worthy Keeler PA-C Entered By: Worthy Keeler on 02/08/2018 10:30:30

## 2018-02-12 ENCOUNTER — Other Ambulatory Visit: Payer: Self-pay | Admitting: Radiology

## 2018-02-12 DIAGNOSIS — R339 Retention of urine, unspecified: Secondary | ICD-10-CM

## 2018-02-12 DIAGNOSIS — E1122 Type 2 diabetes mellitus with diabetic chronic kidney disease: Secondary | ICD-10-CM | POA: Diagnosis not present

## 2018-02-12 DIAGNOSIS — N401 Enlarged prostate with lower urinary tract symptoms: Secondary | ICD-10-CM | POA: Diagnosis not present

## 2018-02-12 DIAGNOSIS — Z23 Encounter for immunization: Secondary | ICD-10-CM | POA: Diagnosis not present

## 2018-02-15 ENCOUNTER — Ambulatory Visit (INDEPENDENT_AMBULATORY_CARE_PROVIDER_SITE_OTHER): Payer: Medicare Other | Admitting: Vascular Surgery

## 2018-02-16 DIAGNOSIS — E1122 Type 2 diabetes mellitus with diabetic chronic kidney disease: Secondary | ICD-10-CM | POA: Diagnosis not present

## 2018-02-16 DIAGNOSIS — N401 Enlarged prostate with lower urinary tract symptoms: Secondary | ICD-10-CM | POA: Diagnosis not present

## 2018-02-16 DIAGNOSIS — N319 Neuromuscular dysfunction of bladder, unspecified: Secondary | ICD-10-CM | POA: Diagnosis not present

## 2018-02-19 ENCOUNTER — Encounter: Payer: Medicare Other | Admitting: Physician Assistant

## 2018-02-19 DIAGNOSIS — E11621 Type 2 diabetes mellitus with foot ulcer: Secondary | ICD-10-CM | POA: Diagnosis not present

## 2018-02-19 DIAGNOSIS — L97522 Non-pressure chronic ulcer of other part of left foot with fat layer exposed: Secondary | ICD-10-CM | POA: Diagnosis not present

## 2018-02-19 DIAGNOSIS — E1140 Type 2 diabetes mellitus with diabetic neuropathy, unspecified: Secondary | ICD-10-CM | POA: Diagnosis not present

## 2018-02-19 DIAGNOSIS — L97526 Non-pressure chronic ulcer of other part of left foot with bone involvement without evidence of necrosis: Secondary | ICD-10-CM | POA: Diagnosis not present

## 2018-02-19 DIAGNOSIS — I110 Hypertensive heart disease with heart failure: Secondary | ICD-10-CM | POA: Diagnosis not present

## 2018-02-19 DIAGNOSIS — I890 Lymphedema, not elsewhere classified: Secondary | ICD-10-CM | POA: Diagnosis not present

## 2018-02-19 DIAGNOSIS — H409 Unspecified glaucoma: Secondary | ICD-10-CM | POA: Diagnosis not present

## 2018-02-22 ENCOUNTER — Ambulatory Visit (INDEPENDENT_AMBULATORY_CARE_PROVIDER_SITE_OTHER): Payer: Medicare Other | Admitting: Vascular Surgery

## 2018-02-22 ENCOUNTER — Encounter (INDEPENDENT_AMBULATORY_CARE_PROVIDER_SITE_OTHER): Payer: Self-pay | Admitting: Vascular Surgery

## 2018-02-22 VITALS — BP 172/88 | HR 105 | Resp 17

## 2018-02-22 DIAGNOSIS — Z89511 Acquired absence of right leg below knee: Secondary | ICD-10-CM | POA: Diagnosis not present

## 2018-02-22 DIAGNOSIS — L97522 Non-pressure chronic ulcer of other part of left foot with fat layer exposed: Secondary | ICD-10-CM | POA: Diagnosis not present

## 2018-02-22 DIAGNOSIS — I890 Lymphedema, not elsewhere classified: Secondary | ICD-10-CM | POA: Diagnosis not present

## 2018-02-22 DIAGNOSIS — I10 Essential (primary) hypertension: Secondary | ICD-10-CM | POA: Diagnosis not present

## 2018-02-22 DIAGNOSIS — I4892 Unspecified atrial flutter: Secondary | ICD-10-CM | POA: Diagnosis not present

## 2018-02-22 DIAGNOSIS — S88111A Complete traumatic amputation at level between knee and ankle, right lower leg, initial encounter: Secondary | ICD-10-CM

## 2018-02-22 NOTE — Progress Notes (Signed)
MRN : [id number]  Miguel Torres is a 84 y.o. ([birth year]) male who presents with chief complaint of No chief complaint on file. [name]  History of Present Illness: The patient returns to the office for followup evaluation regarding leg swelling.  The swelling has improved quite a bit and the pain associated with swelling has decreased substantially. There have not been any interval development of a ulcerations or wounds.  Previous wounds are essentially closed.  Since the previous visit the patient has been in [facility] boots and has noted improvement in the lymphedema. The patient has been using compression routinely morning until night.  The patient also states elevation during the day and exercise is being done too.     No outpatient medications have been marked as taking for the 02/22/18 encounter (Appointment) with Delana Meyer, Dolores Lory, MD.    Past Medical History:  Diagnosis Date  . Anemia   . CHF (congestive heart failure) ([place])   . Diabetes mellitus without complication ([place])   . Hyperlipidemia   . Hypertension   . Skin cancer     Past Surgical History:  Procedure Laterality Date  . BASAL CELL CARCINOMA EXCISION    . History of eye surgery Bilateral   . MOHS SURGERY  04/2011   the top of his head  . Right BKA  09/12/2010   Dr. Delana Meyer; Secondary gangrenous right LE  . TONSILLECTOMY      Social History Social History   Tobacco Use  . Smoking status: Never Smoker  . Smokeless tobacco: Never Used  Substance Use Topics  . Alcohol use: No  . Drug use: No    Family History Family History  Problem Relation Age of Onset  . Diabetes Mother   . Arthritis Sister   . Diabetes Daughter     No Known Allergies   REVIEW OF SYSTEMS (Negative unless checked)  Constitutional: [] Weight loss  [] Fever  [] Chills Cardiac: [] Chest pain   [] Chest pressure   [] Palpitations   [] Shortness of breath when laying flat   [] Shortness of breath with exertion. Vascular:  [] Pain in  legs with walking   [x] Pain in legs at rest  [] History of DVT   [] Phlebitis   [x] Swelling in legs   [] Varicose veins   [] Non-healing ulcers Pulmonary:   [] Uses home oxygen   [] Productive cough   [] Hemoptysis   [] Wheeze  [] COPD   [] Asthma Neurologic:  [] Dizziness   [] Seizures   [] History of stroke   [] History of TIA  [] Aphasia   [] Vissual changes   [] Weakness or numbness in arm   [] Weakness or numbness in leg Musculoskeletal:   [] Joint swelling   [x] Joint pain   [] Low back pain Hematologic:  [] Easy bruising  [] Easy bleeding   [] Hypercoagulable state   [] Anemic Gastrointestinal:  [] Diarrhea   [] Vomiting  [] Gastroesophageal reflux/heartburn   [] Difficulty swallowing. Genitourinary:  [] Chronic kidney disease   [] Difficult urination  [] Frequent urination   [] Blood in urine Skin:  [] Rashes   [] Ulcers  Psychological:  [] History of anxiety   []  History of major depression.  Physical Examination  There were no vitals filed for this visit. There is no height or weight on file to calculate BMI. Gen: WD/WN, NAD Head: [place]/AT, No temporalis wasting.  Ear/Nose/Throat: Hearing grossly intact, nares w/o erythema or drainage Eyes: PER, EOMI, sclera nonicteric.  Neck: Supple, no large masses.   Pulmonary:  Good air movement, no audible wheezing bilaterally, no use of accessory muscles.  Cardiac: RRR, no JVD Vascular:  Moderate venous stasis changes to the left leg.  3+ soft pitting edema left leg with excoriated areas Vessel Right Left  Radial Palpable Palpable  Popliteal Not Palpable Trace Palpable  PT BKA Trace Palpable  DP BKA Trace Palpable  Gastrointestinal: Non-distended. No guarding/no peritoneal signs.  Musculoskeletal: M/S 5/5 throughout.  No deformity or atrophy.  Neurologic: CN 2-12 intact. Symmetrical.  Speech is fluent. Motor exam as listed above. Psychiatric: Judgment intact, Mood & affect appropriate for pt's clinical situation. Dermatologic: Venous rashes + ulcers noted.  No changes  consistent with cellulitis. Lymph : No lichenification or skin changes of chronic lymphedema.  CBC Lab Results  Component Value Date   WBC 7.5 01/25/2018   HGB 10.1 (L) 01/25/2018   HCT 29.6 (L) 01/25/2018   MCV 93.8 01/25/2018   PLT 214 01/25/2018    BMET    Component Value Date/Time   NA 133 (L) 12/21/2017 1238   NA 138 10/15/2016 1611   NA 140 09/30/2012 1609   K 4.1 12/21/2017 1238   K 4.5 09/30/2012 1609   CL 97 (L) 12/21/2017 1238   CL 111 (H) 09/30/2012 1609   CO2 25 12/21/2017 1238   CO2 24 09/30/2012 1609   GLUCOSE 180 (H) 12/21/2017 1238   GLUCOSE 90 09/30/2012 1609   BUN 80 (H) 12/21/2017 1238   BUN 30 (H) 10/15/2016 1611   BUN 28 (H) 09/30/2012 1609   CREATININE 1.79 (H) 12/21/2017 1238   CREATININE 1.33 (H) 09/30/2012 1609   CALCIUM 8.5 (L) 12/21/2017 1238   CALCIUM 8.5 09/30/2012 1609   GFRNONAA 33 (L) 12/21/2017 1238   GFRNONAA 50 (L) 09/30/2012 1609   GFRAA 38 (L) 12/21/2017 1238   GFRAA 58 (L) 09/30/2012 1609   CrCl cannot be calculated (Patient's most recent lab result is older than the maximum 21 days allowed.).  COAG Lab Results  Component Value Date   INR 1.20 01/25/2018    Radiology Mr Foot Left Wo Contrast  Result Date: 02/05/2018 CLINICAL DATA:  Left lateral heel pain 4 5 months EXAM: MRI OF THE LEFT FOOT WITHOUT CONTRAST TECHNIQUE: Multiplanar, multisequence MR imaging of the left foot was performed. No intravenous contrast was administered. COMPARISON:  None. FINDINGS: TENDONS Peroneal: Peroneal longus tendon intact. Peroneal brevis intact. Posteromedial: Posterior tibial tendon intact. Flexor hallucis longus tendon intact. Flexor digitorum longus tendon intact. Anterior: Tibialis anterior tendon intact. Extensor hallucis longus tendon intact Extensor digitorum longus tendon intact. Achilles: Achilles tendon is intact. Small area of ossification within the distal Achilles tendon. Plantar Fascia: Intact. LIGAMENTS Lateral: Anterior  talofibular ligament intact. Calcaneofibular ligament intact. Posterior talofibular ligament intact. Anterior and posterior tibiofibular ligaments intact. Medial: Deltoid ligament intact. Spring ligament intact. CARTILAGE Ankle Joint: No joint effusion. Normal ankle mortise. No chondral defect. Subtalar Joints/Sinus Tarsi: Normal subtalar joints. No subtalar joint effusion. Normal sinus tarsi. Bones: Prior midfoot arthrodesis with solid osseous fusion. Severe joint space narrowing and subchondral reactive marrow changes of the talonavicular joint. Partial-thickness cartilage loss of the anterior subtalar joint. No periosteal reaction or bone destruction. Soft Tissue: Soft tissue wound along the posterolateral hindfoot. Generalized circumferential soft tissue edema in the subcutaneous fat around the left lower leg and ankle which may be secondary to fluid overload, venous stasis or cellulitis. IMPRESSION: 1. No osteomyelitis of left ankle. 2. Generalized circumferential soft tissue edema in the subcutaneous fat around the left lower leg and ankle which may be secondary to fluid overload, venous stasis or cellulitis. 3. Prior midfoot arthrodesis with  solid osseous fusion. Electronically Signed   By: Kathreen Devoid   On: 02/05/2018 15:01   Ct Image Guided Drainage By Percutaneous Catheter  Result Date: 01/25/2018 INDICATION: Urinary retention EXAM: CT-GUIDED SUPRAPUBIC CATHETER INSERTION MEDICATIONS: 1% LIDOCAINE LOCAL ANESTHESIA/SEDATION: Fentanyl 75 mcg IV; Versed 2.0 mg IV Moderate Sedation Time:  12 MINUTES The patient was continuously monitored during the procedure by the interventional radiology nurse under my direct supervision. COMPLICATIONS: None immediate. PROCEDURE: Informed written consent was obtained from the patient after a thorough discussion of the procedural risks, benefits and alternatives. All questions were addressed. Maximal Sterile Barrier Technique was utilized including caps, mask, sterile  gowns, sterile gloves, sterile drape, hand hygiene and skin antiseptic. A timeout was performed prior to the initiation of the procedure. Previous imaging reviewed. Patient positioned supine. Noncontrast localization CT performed. The moderately distended bladder was localized in the midline. Overlying skin marked for an anterior approach. Under sterile conditions and local anesthesia, CT guidance was utilized to advance an 18 gauge 10 cm needle from the midline into the bladder. Needle position confirmed with CT. Syringe aspiration yielded clear urine. Guidewire inserted followed by tract dilatation to insert a 16 [place] drain. Drain catheter position confirmed with CT. No immediate complication. Patient tolerated the procedure well. Catheter secured with a Prolene suture and connected to external gravity drainage bag. Sterile dressing applied. IMPRESSION: Successful CT-guided 16 French suprapubic catheter insertion. Electronically Signed   By: Jerilynn Mages.  Shick M.D.   On: 01/25/2018 11:20     Assessment/Plan 1. Lymphedema No surgery or intervention at this point in time.    I have had a long discussion with the patient regarding venous insufficiency and why it  causes symptoms, specifically venous ulceration . I have discussed with the patient the chronic skin changes that accompany venous insufficiency and the long term sequela such as infection and recurring  ulceration.  Patient will be placed in [employer] which will be changed weekly drainage permitting.  In addition, behavioral modification including several periods of elevation of the lower extremities during the day will be continued. Achieving a position with the ankles at heart level was stressed to the patient  The patient is instructed to begin routine exercise, especially walking on a daily basis  Patient should undergo duplex ultrasound of the venous system to ensure that DVT or reflux is not present.  Following the review of the ultrasound  the patient will follow up in one week to reassess the degree of swelling and the control that Unna therapy is offering.   The patient can be assessed for graduated compression stockings or wraps as well as a Lymph Pump once the ulcers are healed.   2. Amputation of right lower extremity below knee upon examination ([place]) Continue with the prosthesis  3. Foot ulceration, left, with fat layer exposed ([place]) Essentially healed will continue Unna boot until compression wraps are obtained  4. Atrial flutter, unspecified type ([place]) Continue antiarrhythmia medications as already ordered, these medications have been reviewed and there are no changes at this time.  Continue anticoagulation as ordered by Cardiology Service   5. Essential hypertension Continue antihypertensive medications as already ordered, these medications have been reviewed and there are no changes at this time.     Hortencia Pilar, MD  02/22/2018 8:27 AM

## 2018-02-23 DIAGNOSIS — N401 Enlarged prostate with lower urinary tract symptoms: Secondary | ICD-10-CM | POA: Diagnosis not present

## 2018-02-23 DIAGNOSIS — E1122 Type 2 diabetes mellitus with diabetic chronic kidney disease: Secondary | ICD-10-CM | POA: Diagnosis not present

## 2018-02-23 DIAGNOSIS — N319 Neuromuscular dysfunction of bladder, unspecified: Secondary | ICD-10-CM | POA: Diagnosis not present

## 2018-02-24 DIAGNOSIS — Z89511 Acquired absence of right leg below knee: Secondary | ICD-10-CM | POA: Diagnosis not present

## 2018-02-24 DIAGNOSIS — I5032 Chronic diastolic (congestive) heart failure: Secondary | ICD-10-CM | POA: Diagnosis not present

## 2018-02-24 DIAGNOSIS — I4892 Unspecified atrial flutter: Secondary | ICD-10-CM | POA: Diagnosis not present

## 2018-02-24 DIAGNOSIS — N401 Enlarged prostate with lower urinary tract symptoms: Secondary | ICD-10-CM | POA: Diagnosis not present

## 2018-02-24 DIAGNOSIS — Z0000 Encounter for general adult medical examination without abnormal findings: Secondary | ICD-10-CM | POA: Diagnosis not present

## 2018-02-24 DIAGNOSIS — Z789 Other specified health status: Secondary | ICD-10-CM | POA: Diagnosis not present

## 2018-02-24 DIAGNOSIS — N138 Other obstructive and reflux uropathy: Secondary | ICD-10-CM | POA: Diagnosis not present

## 2018-02-26 ENCOUNTER — Other Ambulatory Visit: Payer: Self-pay | Admitting: Radiology

## 2018-02-26 ENCOUNTER — Ambulatory Visit: Payer: Medicare Other

## 2018-02-26 NOTE — Progress Notes (Signed)
Miguel Torres, Miguel Torres ([id number]) Visit Report for 02/19/2018 Arrival Information Details Patient Name: Miguel Torres, Miguel Torres Date of Service: 02/19/2018 9:15 AM Medical Record Number: [id number] Patient Account Number: [id number] Date of Birth/Sex: Sep 25, 1930 (84 y.o. M) Treating RN: Montey Hora Primary Care Tenya Araque: Lelon Huh Other Clinician: Referring Aloura Matsuoka: Lelon Huh Treating Gracelynn Bircher/Extender: Melburn Hake, HOYT Weeks in Treatment: 4 Visit Information History Since Last Visit Added or deleted any medications: No Patient Arrived: Wheel Chair Any new allergies or adverse reactions: No Arrival Time: 09:05 Had a fall or experienced change in No Accompanied By: daughter activities of daily living that may affect Transfer Assistance: Manual risk of falls: Patient Identification Verified: Yes Signs or symptoms of abuse/neglect since last No Secondary Verification Process Yes visito Completed: Hospitalized since last visit: No Patient Has Alerts: Yes Implantable device outside of the clinic No Patient Alerts: Patient on Blood excluding Thinner cellular tissue based products placed in the [facility] since last visit: Has Dressing in Place as Prescribed: Yes Has Footwear/Offloading in Place as Yes Prescribed: Left: Surgical Shoe with Pressure Relief Insole Pain Present Now: No Electronic Signature(s) Signed: 02/19/2018 1:18:05 PM By: Lorine Bears RCP, RRT, CHT Entered By: Lorine Bears on 02/19/2018 09:06:11 Miguel Torres ([id number]) -------------------------------------------------------------------------------- Clinic Level of Care Assessment Details Patient Name: Miguel Torres Date of Service: 02/19/2018 9:15 AM Medical Record Number: [id number] Patient Account Number: [id number] Date of Birth/Sex: [birth year] (84 y.o. M) Treating RN: Montey Hora Primary Care Jakaleb Payer: Lelon Huh Other  Clinician: Referring Lynia Landry: Lelon Huh Treating Godwin Tedesco/Extender: Melburn Hake, HOYT Weeks in Treatment: 4 Clinic Level of Care Assessment Items TOOL 4 Quantity Score []  - Use when only an EandM is performed on FOLLOW-UP visit 0 ASSESSMENTS - Nursing Assessment / Reassessment X - Reassessment of Co-morbidities (includes updates in patient status) 1 10 X- 1 5 Reassessment of Adherence to Treatment Plan ASSESSMENTS - Wound and Skin Assessment / Reassessment X - Simple Wound Assessment / Reassessment - one wound 1 5 []  - 0 Complex Wound Assessment / Reassessment - multiple wounds []  - 0 Dermatologic / Skin Assessment (not related to wound area) ASSESSMENTS - Focused Assessment X - Circumferential Edema Measurements - multi extremities 1 5 []  - 0 Nutritional Assessment / Counseling / Intervention X- 1 5 Lower Extremity Assessment (monofilament, tuning fork, pulses) []  - 0 Peripheral Arterial Disease Assessment (using hand held doppler) ASSESSMENTS - Ostomy and/or Continence Assessment and Care []  - Incontinence Assessment and Management 0 []  - 0 Ostomy Care Assessment and Management (repouching, etc.) PROCESS - Coordination of Care X - Simple Patient / Family Education for ongoing care 1 15 []  - 0 Complex (extensive) Patient / Family Education for ongoing care []  - 0 Staff obtains Programmer, systems, Records, Test Results / Process Orders []  - 0 Staff telephones HHA, Nursing Homes / Clarify orders / etc []  - 0 Routine Transfer to another Facility (non-emergent condition) []  - 0 Routine Hospital Admission (non-emergent condition) []  - 0 New Admissions / Biomedical engineer / Ordering NPWT, Apligraf, etc. []  - 0 Emergency Hospital Admission (emergent condition) X- 1 10 Simple Discharge Coordination Miguel Torres, Miguel Torres ([id number]) []  - 0 Complex (extensive) Discharge Coordination PROCESS - Special Needs []  - Pediatric / Minor Patient Management 0 []  - 0 Isolation  Patient Management []  - 0 Hearing / Language / Visual special needs []  - 0 Assessment of Community assistance (transportation, D/C planning, etc.) []  - 0 Additional assistance / Altered mentation []  - 0 Support Surface(s) Assessment (bed, cushion,  seat, etc.) INTERVENTIONS - Wound Cleansing / Measurement X - Simple Wound Cleansing - one wound 1 5 []  - 0 Complex Wound Cleansing - multiple wounds X- 1 5 Wound Imaging (photographs - any number of wounds) []  - 0 Wound Tracing (instead of photographs) X- 1 5 Simple Wound Measurement - one wound []  - 0 Complex Wound Measurement - multiple wounds INTERVENTIONS - Wound Dressings []  - Small Wound Dressing one or multiple wounds 0 []  - 0 Medium Wound Dressing one or multiple wounds []  - 0 Large Wound Dressing one or multiple wounds []  - 0 Application of Medications - topical []  - 0 Application of Medications - injection INTERVENTIONS - Miscellaneous []  - External ear exam 0 []  - 0 Specimen Collection (cultures, biopsies, blood, body fluids, etc.) []  - 0 Specimen(s) / Culture(s) sent or taken to Lab for analysis []  - 0 Patient Transfer (multiple staff / Civil Service fast streamer / Similar devices) []  - 0 Simple Staple / Suture removal (25 or less) []  - 0 Complex Staple / Suture removal (26 or more) []  - 0 Hypo / Hyperglycemic Management (close monitor of Blood Glucose) []  - 0 Ankle / Brachial Index (ABI) - do not check if billed separately X- 1 5 Vital Signs Miguel Torres, Miguel Torres ([id number]) Has the patient been seen at the hospital within the last three years: Yes Total Score: 75 Level Of Care: New/Established - Level 2 Electronic Signature(s) Signed: 02/19/2018 5:23:22 PM By: Montey Hora Entered By: Montey Hora on 02/19/2018 10:29:08 Miguel Torres ([id number]) -------------------------------------------------------------------------------- Encounter Discharge Information Details Patient Name: Miguel Torres Date  of Service: 02/19/2018 9:15 AM Medical Record Number: [id number] Patient Account Number: [id number] Date of Birth/Sex: 06-30-1930 (84 y.o. M) Treating RN: Montey Hora Primary Care Torrin Crihfield: Lelon Huh Other Clinician: Referring Kalin Amrhein: Lelon Huh Treating Koralynn Greenspan/Extender: Melburn Hake, HOYT Weeks in Treatment: 4 Encounter Discharge Information Items Discharge Condition: Stable Ambulatory Status: Wheelchair Discharge Destination: Skilled Nursing Facility Telephoned: No Orders Sent: Yes Transportation: Private Auto Accompanied By: dtr Schedule Follow-up Appointment: No Clinical Summary of Care: Electronic Signature(s) Signed: 02/19/2018 10:29:32 AM By: Montey Hora Entered By: Montey Hora on 02/19/2018 10:29:32 Miguel Torres ([id number]) -------------------------------------------------------------------------------- Lower Extremity Assessment Details Patient Name: Miguel Torres Date of Service: 02/19/2018 9:15 AM Medical Record Number: [id number] Patient Account Number: [id number] Date of Birth/Sex: [birth year] (84 y.o. M) Treating RN: Secundino Ginger Primary Care Claretta Kendra: Lelon Huh Other Clinician: Referring Staria Birkhead: Lelon Huh Treating Brigit Doke/Extender: Melburn Hake, HOYT Weeks in Treatment: 4 Edema Assessment Assessed: [Left: No] [Right: No] Edema: [Left: N] [Right: o] Calf Left: Right: Point of Measurement: 31 cm From Medial Instep 38 cm cm Ankle Left: Right: Point of Measurement: 12 cm From Medial Instep 25.5 cm cm Vascular Assessment Claudication: Claudication Assessment [Left:None] Pulses: Dorsalis Pedis Palpable: [Left:Yes] Posterior Tibial Extremity colors, hair growth, and conditions: Extremity Color: [Left:Red] Hair Growth on Extremity: [Left:No] Temperature of Extremity: [Left:Warm] Capillary Refill: [Left:< 3 seconds] Toe Nail Assessment Left: Right: Thick: Yes Discolored: Yes Deformed: Yes Improper Length and Hygiene:  Yes Electronic Signature(s) Signed: 02/19/2018 3:28:11 PM By: Secundino Ginger Entered By: Secundino Ginger on 02/19/2018 09:22:10 Miguel Torres ([id number]) -------------------------------------------------------------------------------- Multi-Disciplinary Care Plan Details Patient Name: Miguel Torres Date of Service: 02/19/2018 9:15 AM Medical Record Number: [id number] Patient Account Number: [id number] Date of Birth/Sex: June 03, 1930 (84 y.o. M) Treating RN: Montey Hora Primary Care Paco Cislo: Lelon Huh Other Clinician: Referring Marybell Robards: Lelon Huh Treating Callie Facey/Extender: Melburn Hake, HOYT Weeks in Treatment: 4 Active Inactive Electronic Signature(s) Signed:  02/19/2018 5:23:22 PM By: Montey Hora Entered By: Montey Hora on 02/19/2018 10:03:42 Miguel Torres ([id number]) -------------------------------------------------------------------------------- Pain Assessment Details Patient Name: Miguel Torres Date of Service: 02/19/2018 9:15 AM Medical Record Number: [id number] Patient Account Number: [id number] Date of Birth/Sex: [birth year] (84 y.o. M) Treating RN: Montey Hora Primary Care Kodie Kishi: Lelon Huh Other Clinician: Referring Dyllan Kats: Lelon Huh Treating Dequita Schleicher/Extender: Melburn Hake, HOYT Weeks in Treatment: 4 Active Problems Location of Pain Severity and Description of Pain Patient Has Paino No Site Locations Pain Management and Medication Current Pain Management: Electronic Signature(s) Signed: 02/19/2018 1:18:05 PM By: Lorine Bears RCP, RRT, CHT Signed: 02/19/2018 5:23:22 PM By: Montey Hora Entered By: Lorine Bears on 02/19/2018 09:06:18 Miguel Torres ([id number]) -------------------------------------------------------------------------------- Patient/Caregiver Education Details Patient Name: Miguel Torres Date of Service: 02/19/2018 9:15 AM Medical Record Number:  [id number] Patient Account Number: [id number] Date of Birth/Gender: January 11, 1931 (84 y.o. M) Treating RN: Montey Hora Primary Care Physician: Lelon Huh Other Clinician: Referring Physician: Lelon Huh Treating Physician/Extender: Sharalyn Ink in Treatment: 4 Education Assessment Education Provided To: Patient and Caregiver Education Topics Provided Basic Hygiene: Handouts: Other: care of newly healed ulcer site Methods: Demonstration, Explain/Verbal, Printed Responses: State content correctly Electronic Signature(s) Signed: 02/19/2018 5:23:22 PM By: Montey Hora Entered By: Montey Hora on 02/19/2018 10:29:50 Miguel Torres ([id number]) -------------------------------------------------------------------------------- Wound Assessment Details Patient Name: Miguel Torres Date of Service: 02/19/2018 9:15 AM Medical Record Number: [id number] Patient Account Number: [id number] Date of Birth/Sex: 1930/10/27 (84 y.o. M) Treating RN: Montey Hora Primary Care Cimberly Stoffel: Lelon Huh Other Clinician: Referring Jacqulene Huntley: Lelon Huh Treating Cruzito Standre/Extender: Melburn Hake, HOYT Weeks in Treatment: 4 Wound Status Wound Number: 1 Primary Diabetic Wound/Ulcer of the Lower Extremity Etiology: Wound Location: Left, Lateral Foot Wound Healed - Epithelialized Wounding Event: Gradually Appeared Status: Date Acquired: 11/17/2017 Comorbid Glaucoma, Congestive Heart Failure, Weeks Of Treatment: 4 History: Hypertension, Type II Diabetes, Gout, Clustered Wound: No Osteoarthritis, Neuropathy Photos Photo Uploaded By: Secundino Ginger on 02/19/2018 09:25:59 Wound Measurements Length: (cm) 0 % R Width: (cm) 0 % R Depth: (cm) 0 Epi Area: (cm) 0 Tu Volume: (cm) 0 Un eduction in Area: 100% eduction in Volume: 100% thelialization: Large (67-100%) nneling: No dermining: No Wound Description Classification: Grade 2 Wound Margin: Indistinct, nonvisible Exudate  Amount: None Present Foul Odor After Cleansing: No Slough/Fibrino Yes Wound Bed Granulation Amount: None Present (0%) Exposed Structure Necrotic Amount: None Present (0%) Fascia Exposed: No Fat Layer (Subcutaneous Tissue) Exposed: Yes Tendon Exposed: No Muscle Exposed: No Joint Exposed: No Bone Exposed: No Periwound Skin Texture Texture Color No Abnormalities Noted: No No Abnormalities Noted: No Miguel Torres, Miguel Torres ([id number]) Callus: No Atrophie Blanche: No Crepitus: No Cyanosis: No Excoriation: No Ecchymosis: No Induration: Yes Erythema: No Rash: No Hemosiderin Staining: No Scarring: No Mottled: No Pallor: No Moisture Rubor: No No Abnormalities Noted: No Dry / Scaly: No Temperature / Pain Maceration: Yes Temperature: No Abnormality Wound Preparation Ulcer Cleansing: Rinsed/Irrigated with Saline Topical Anesthetic Applied: Other: lidocaine 4%, Electronic Signature(s) Signed: 02/19/2018 5:23:22 PM By: Montey Hora Entered By: Montey Hora on 02/19/2018 10:03:30 Miguel Torres ([id number]) -------------------------------------------------------------------------------- Vitals Details Patient Name: Miguel Torres Date of Service: 02/19/2018 9:15 AM Medical Record Number: [id number] Patient Account Number: [id number] Date of Birth/Sex: [birth year] (84 y.o. M) Treating RN: Montey Hora Primary Care Shilynn Hoch: Lelon Huh Other Clinician: Referring Romy Mcgue: Lelon Huh Treating Kennis Buell/Extender: Melburn Hake, HOYT Weeks in Treatment: 4 Vital Signs Time Taken: 09:05 Temperature (F): 98.2 Height (in): 68 Pulse (  bpm): 103 Weight (lbs): 216 Respiratory Rate (breaths/min): 18 Body Mass Index (BMI): 32.8 Blood Pressure (mmHg): 136/60 Reference Range: 80 - 120 mg / dl Electronic Signature(s) Signed: 02/19/2018 1:18:05 PM By: Lorine Bears RCP, RRT, CHT Entered By: Becky Sax, Amado Nash on 02/19/2018 09:08:57

## 2018-02-26 NOTE — Progress Notes (Signed)
RAMAL, ECKHARDT ([id number]) Visit Report for 02/19/2018 Chief Complaint Document Details Patient Name: Miguel Torres, Miguel Torres Date of Service: 02/19/2018 9:15 AM Medical Record Number: [id number] Patient Account Number: [id number] Date of Birth/Sex: 1930/10/12 (84 y.o. M) Treating RN: Montey Hora Primary Care Provider: Lelon Huh Other Clinician: Referring Provider: Lelon Huh Treating Provider/Extender: Melburn Hake, HOYT Weeks in Treatment: 4 Information Obtained from: Patient Chief Complaint Left foot ulcer Electronic Signature(s) Signed: 02/25/2018 1:45:00 AM By: Worthy Keeler PA-C Entered By: Worthy Keeler on 02/19/2018 08:49:29 Miguel Torres ([id number]) -------------------------------------------------------------------------------- HPI Details Patient Name: Miguel Torres Date of Service: 02/19/2018 9:15 AM Medical Record Number: [id number] Patient Account Number: [id number] Date of Birth/Sex: 1931/02/23 (84 y.o. M) Treating RN: Montey Hora Primary Care Provider: Lelon Huh Other Clinician: Referring Provider: Lelon Huh Treating Provider/Extender: Melburn Hake, HOYT Weeks in Treatment: 4 History of Present Illness Associated Signs and Symptoms: Patient has a history of diabetes mellitus type II, lymphedema, a right below knee amputation which was performed 10 years ago, and proof of vascular disease which is managed by [facility], Dr. dew HPI Description: 01/19/18 on evaluation today patient presents for initial evaluation and clinic due to issues that he has been having with a left lateral foot ulcer which has been present for at minimum two months although this may have actually been longer before the family knew of the wound. Currently he has been seen [facility] where they have performed arterial studies. I did have those for review today which did show that he has mild left lower extremity arterial disease  with a abnormal left toe brachial index. His ABI was 0.93 with a TBI of 0.26. Upon further evaluation it appears that the patient actually was recommended to have an angiogram by Dr. dew. This was due to the fact that there was concerned about the low TBI and the fact that he may not be perfusion all the way down into his foot. With that being said the final decision as to whether or not to proceed with this has not been made as of yet according to the patient's daughter who was present for evaluation at this time today. The patient has no evidence of infection at this time. With that being said there is bone exposed in the base of the wound unfortunately. This obviously may indicate a more significant issue underlying just the open wound. Other than that he actually appears to have a significant amount of epithelialization noted which is excellent news. No fevers, chills, nausea, or vomiting noted at this time. He has had an x-ray which revealed no evidence of osteomyelitis this was performed July 2019. No MRI has been performed at this point. 01/28/18 on evaluation today patient actually appears to be doing about the same in regard to his left lateral foot ulcer. He does not have as much is the way of necrotic tissue overlying the surface of the wound in fact no sharp debridement was necessary. With that being said he does have some maceration noted. He may benefit from adding alginate over the Prisma along the lateral aspect of his wound. In regard to the Kerlex and Coban wrap it appears that this actually was not wrapped as high as it should've been and therefore the patient felt blisters on the lateral portion of his upper left lower extremity. 02/08/18 on evaluation today patient presents today for follow-up after having had his MRI which was performed on 02/05/18. This MRI showed that  there did not appear to be any evidence of osteomyelitis of the left foot or ankle at this time. He did have a  prior midfoot arthrodesis with solid osseous fusion. Nonetheless in general the patient seems to be doing excellent at this point in time which is great news. Overall very pleased with the fact there is no infection and the fact that his wound actually seems to be doing better. 02/19/18 on evaluation today patient appears to be doing excellent in regard to his foot ulcer. He's been tolerating the dressing changes without complication. Fortunately there's no sign of infection and the wound appears to be completely healed. The patient and his daughter are both extremely pleased. Electronic Signature(s) Signed: 02/25/2018 1:45:00 AM By: Worthy Keeler PA-C Entered By: Worthy Keeler on 02/19/2018 11:34:36 Miguel Torres ([id number]) -------------------------------------------------------------------------------- Physical Exam Details Patient Name: Miguel Torres Date of Service: 02/19/2018 9:15 AM Medical Record Number: [id number] Patient Account Number: [id number] Date of Birth/Sex: [birth year] (84 y.o. M) Treating RN: Montey Hora Primary Care Provider: Lelon Huh Other Clinician: Referring Provider: Lelon Huh Treating Provider/Extender: Melburn Hake, HOYT Weeks in Treatment: 4 Constitutional Well-nourished and well-hydrated in no acute distress. Respiratory normal breathing without difficulty. Psychiatric this patient is able to make decisions and demonstrates good insight into disease process. Alert and Oriented x 3. pleasant and cooperative. Notes Patient's wound bed at this point shows signs of excellent epithelialization and in fact appears to be completely close. I did probe at the area where the biggest depth had been previous and this again was completely solid no evidence of opening at this time. Electronic Signature(s) Signed: 02/25/2018 1:45:00 AM By: Worthy Keeler PA-C Entered By: Worthy Keeler on 02/19/2018 11:35:18 Miguel Torres  ([id number]) -------------------------------------------------------------------------------- Physician Orders Details Patient Name: Miguel Torres Date of Service: 02/19/2018 9:15 AM Medical Record Number: [id number] Patient Account Number: [id number] Date of Birth/Sex: 06/22/1930 (84 y.o. M) Treating RN: Montey Hora Primary Care Provider: Lelon Huh Other Clinician: Referring Provider: Lelon Huh Treating Provider/Extender: Melburn Hake, HOYT Weeks in Treatment: 4 Verbal / Phone Orders: No Diagnosis Coding ICD-10 Coding Code Description E11.621 Type 2 diabetes mellitus with foot ulcer L97.526 Non-pressure chronic ulcer of other part of left foot with bone involvement without evidence of necrosis I89.0 Lymphedema, not elsewhere classified I73.89 Other specified peripheral vascular diseases Z89.511 Acquired absence of right leg below knee Discharge From [facility] o Discharge from [facility] - Please keep this newly healed area covered with a bordered foam dressing for the next 2 weeks for protection Electronic Signature(s) Signed: 02/19/2018 5:23:22 PM By: Montey Hora Signed: 02/25/2018 1:45:00 AM By: Worthy Keeler PA-C Entered By: Montey Hora on 02/19/2018 10:04:27 Miguel Torres ([id number]) -------------------------------------------------------------------------------- Problem List Details Patient Name: Miguel Torres Date of Service: 02/19/2018 9:15 AM Medical Record Number: [id number] Patient Account Number: [id number] Date of Birth/Sex: May 21, 1930 (84 y.o. M) Treating RN: Montey Hora Primary Care Provider: Lelon Huh Other Clinician: Referring Provider: Lelon Huh Treating Provider/Extender: Melburn Hake, HOYT Weeks in Treatment: 4 Active Problems ICD-10 Evaluated Encounter Code Description Active Date Today Diagnosis E11.621 Type 2 diabetes mellitus with foot ulcer 01/19/2018 No Yes L97.526 Non-pressure chronic ulcer  of other part of left foot with bone 01/19/2018 No Yes involvement without evidence of necrosis I89.0 Lymphedema, not elsewhere classified 01/19/2018 No Yes I73.89 Other specified peripheral vascular diseases 01/19/2018 No Yes Z89.511 Acquired absence of right leg below knee 01/19/2018 No Yes Inactive Problems Resolved Problems Electronic  Signature(s) Signed: 02/25/2018 1:45:00 AM By: Worthy Keeler PA-C Entered By: Worthy Keeler on 02/19/2018 08:49:24 Miguel Torres ([id number]) -------------------------------------------------------------------------------- Progress Note Details Patient Name: Miguel Torres Date of Service: 02/19/2018 9:15 AM Medical Record Number: [id number] Patient Account Number: [id number] Date of Birth/Sex: [birth year] (84 y.o. M) Treating RN: Montey Hora Primary Care Provider: Lelon Huh Other Clinician: Referring Provider: Lelon Huh Treating Provider/Extender: Melburn Hake, HOYT Weeks in Treatment: 4 Subjective Chief Complaint Information obtained from Patient Left foot ulcer History of Present Illness (HPI) The following HPI elements were documented for the patient's wound: Associated Signs and Symptoms: Patient has a history of diabetes mellitus type II, lymphedema, a right below knee amputation which was performed 10 years ago, and proof of vascular disease which is managed by [facility], Dr. dew 01/19/18 on evaluation today patient presents for initial evaluation and clinic due to issues that he has been having with a left lateral foot ulcer which has been present for at minimum two months although this may have actually been longer before the family knew of the wound. Currently he has been seen [facility] where they have performed arterial studies. I did have those for review today which did show that he has mild left lower extremity arterial disease with a abnormal left toe brachial index. His ABI was  0.93 with a TBI of 0.26. Upon further evaluation it appears that the patient actually was recommended to have an angiogram by Dr. dew. This was due to the fact that there was concerned about the low TBI and the fact that he may not be perfusion all the way down into his foot. With that being said the final decision as to whether or not to proceed with this has not been made as of yet according to the patient's daughter who was present for evaluation at this time today. The patient has no evidence of infection at this time. With that being said there is bone exposed in the base of the wound unfortunately. This obviously may indicate a more significant issue underlying just the open wound. Other than that he actually appears to have a significant amount of epithelialization noted which is excellent news. No fevers, chills, nausea, or vomiting noted at this time. He has had an x-ray which revealed no evidence of osteomyelitis this was performed July 2019. No MRI has been performed at this point. 01/28/18 on evaluation today patient actually appears to be doing about the same in regard to his left lateral foot ulcer. He does not have as much is the way of necrotic tissue overlying the surface of the wound in fact no sharp debridement was necessary. With that being said he does have some maceration noted. He may benefit from adding alginate over the Prisma along the lateral aspect of his wound. In regard to the Kerlex and Coban wrap it appears that this actually was not wrapped as high as it should've been and therefore the patient felt blisters on the lateral portion of his upper left lower extremity. 02/08/18 on evaluation today patient presents today for follow-up after having had his MRI which was performed on 02/05/18. This MRI showed that there did not appear to be any evidence of osteomyelitis of the left foot or ankle at this time. He did have a prior midfoot arthrodesis with solid osseous fusion.  Nonetheless in general the patient seems to be doing excellent at this point in time which is great news. Overall very  pleased with the fact there is no infection and the fact that his wound actually seems to be doing better. 02/19/18 on evaluation today patient appears to be doing excellent in regard to his foot ulcer. He's been tolerating the dressing changes without complication. Fortunately there's no sign of infection and the wound appears to be completely healed. The patient and his daughter are both extremely pleased. Patient History Information obtained from Patient. Family History Diabetes - Mother, Heart Disease - Mother, Hypertension - Mother, Miguel Torres, Miguel Torres ([id number]) No family history of Kidney Disease, Lung Disease, Seizures, Stroke, Thyroid Problems, Tuberculosis. Social History Never smoker, Marital Status - Married, Alcohol Use - Never, Drug Use - No History, Caffeine Use - Daily. Review of Systems (ROS) Constitutional Symptoms (General Health) Denies complaints or symptoms of Fever, Chills. Respiratory The patient has no complaints or symptoms. Cardiovascular The patient has no complaints or symptoms. Psychiatric The patient has no complaints or symptoms. Objective Constitutional Well-nourished and well-hydrated in no acute distress. Vitals Time Taken: 9:05 AM, Height: 68 in, Weight: 216 lbs, BMI: 32.8, Temperature: 98.2 F, Pulse: 103 bpm, Respiratory Rate: 18 breaths/min, Blood Pressure: 136/60 mmHg. Respiratory normal breathing without difficulty. Psychiatric this patient is able to make decisions and demonstrates good insight into disease process. Alert and Oriented x 3. pleasant and cooperative. General Notes: Patient's wound bed at this point shows signs of excellent epithelialization and in fact appears to be completely close. I did probe at the area where the biggest depth had been previous and this again was completely solid no evidence of opening  at this time. Integumentary (Hair, Skin) Wound #1 status is Healed - Epithelialized. Original cause of wound was Gradually Appeared. The wound is located on the Left,Lateral Foot. The wound measures 0cm length x 0cm width x 0cm depth; 0cm^2 area and 0cm^3 volume. There is Fat Layer (Subcutaneous Tissue) Exposed exposed. There is no tunneling or undermining noted. There is a none present amount of drainage noted. The wound margin is indistinct and nonvisible. There is no granulation within the wound bed. There is no necrotic tissue within the wound bed. The periwound skin appearance exhibited: Induration, Maceration. The periwound skin appearance did not exhibit: Callus, Crepitus, Excoriation, Rash, Scarring, Dry/Scaly, Atrophie Blanche, Cyanosis, Ecchymosis, Hemosiderin Staining, Mottled, Pallor, Rubor, Erythema. Periwound temperature was noted as No Abnormality. Assessment Miguel Torres, Miguel Torres ([id number]) Active Problems ICD-10 Type 2 diabetes mellitus with foot ulcer Non-pressure chronic ulcer of other part of left foot with bone involvement without evidence of necrosis Lymphedema, not elsewhere classified Other specified peripheral vascular diseases Acquired absence of right leg below knee Plan Discharge From [facility] Services: Discharge from [facility] - Please keep this newly healed area covered with a bordered foam dressing for the next 2 weeks for protection I'm gonna suggest currently that we see the patient back on an as-needed basis at this point I do recommend a protective dressing for the next two weeks in order to prevent anything from reopening and that can be discontinued. They are in agreement with plan. If anything changes or worsens in the future it will contact our office for additional recommendations. Electronic Signature(s) Signed: 02/25/2018 1:45:00 AM By: Worthy Keeler PA-C Entered By: Worthy Keeler on 02/19/2018 11:35:43 Miguel Torres  ([id number]) -------------------------------------------------------------------------------- ROS/PFSH Details Patient Name: Miguel Torres Date of Service: 02/19/2018 9:15 AM Medical Record Number: [id number] Patient Account Number: [id number] Date of Birth/Sex: Dec 08, 1930 (84 y.o. M) Treating RN: Montey Hora Primary Care Provider: Caryn Section,  DONALD Other Clinician: Referring Provider: Lelon Huh Treating Provider/Extender: Melburn Hake, HOYT Weeks in Treatment: 4 Information Obtained From Patient Wound History Do you currently have one or more open woundso Yes How many open wounds do you currently haveo 1 Approximately how long have you had your woundso 6 months How have you been treating your wound(s) until nowo Santyl Has your wound(s) ever healed and then re-openedo No Have you had any lab work done in the past montho No Have you tested positive for an antibiotic resistant organism (MRSA, VRE)o No Have you tested positive for osteomyelitis (bone infection)o No Have you had any tests for circulation on your legso Yes Constitutional Symptoms (General Health) Complaints and Symptoms: Negative for: Fever; Chills Eyes Medical History: Positive for: Glaucoma Negative for: Cataracts; Optic Neuritis Ear/Nose/Mouth/Throat Medical History: Negative for: Chronic sinus problems/congestion; Middle ear problems Hematologic/Lymphatic Medical History: Negative for: Anemia; Hemophilia; Human Immunodeficiency Virus; Lymphedema; Sickle Cell Disease Respiratory Complaints and Symptoms: No Complaints or Symptoms Medical History: Negative for: Aspiration; Asthma; Chronic Obstructive Pulmonary Disease (COPD); Pneumothorax; Sleep Apnea; Tuberculosis Cardiovascular Complaints and Symptoms: No Complaints or Symptoms Medical History: Positive for: Congestive Heart Failure; Hypertension Miguel Torres, Miguel Torres ([id number]) Negative for: Angina; Arrhythmia; Coronary Artery Disease; Deep Vein  Thrombosis; Hypotension; Myocardial Infarction; Peripheral Arterial Disease; Peripheral Venous Disease; Phlebitis; Vasculitis Gastrointestinal Medical History: Negative for: Cirrhosis ; Colitis; Crohnos; Hepatitis A; Hepatitis B; Hepatitis C Endocrine Medical History: Positive for: Type II Diabetes Negative for: Type I Diabetes Time with diabetes: 10 years Treated with: Oral agents Blood sugar tested every day: Yes Tested : Genitourinary Medical History: Negative for: End Stage Renal Disease Immunological Medical History: Negative for: Lupus Erythematosus; Raynaudos; Scleroderma Integumentary (Skin) Medical History: Negative for: History of Burn; History of pressure wounds Musculoskeletal Medical History: Positive for: Gout; Osteoarthritis Negative for: Rheumatoid Arthritis; Osteomyelitis Neurologic Medical History: Positive for: Neuropathy Negative for: Dementia; Quadriplegia; Paraplegia; Seizure Disorder Oncologic Medical History: Negative for: Received Chemotherapy; Received Radiation Psychiatric Complaints and Symptoms: No Complaints or Symptoms Medical History: Negative for: Anorexia/bulimia; Confinement Anxiety HBO Extended History Items Miguel Torres, Miguel Torres ([id number]) Eyes: Glaucoma Immunizations Pneumococcal Vaccine: Received Pneumococcal Vaccination: Yes Implantable Devices Family and Social History Diabetes: Yes - Mother; Heart Disease: Yes - Mother; Hypertension: Yes - Mother; Kidney Disease: No; Lung Disease: No; Seizures: No; Stroke: No; Thyroid Problems: No; Tuberculosis: No; Never smoker; Marital Status - Married; Alcohol Use: Never; Drug Use: No History; Caffeine Use: Daily; Advanced Directives: Yes (Not Provided); Patient does not want information on Advanced Directives; Living Will: Yes (Not Provided); Medical Power of Attorney: Yes (Copy provided) Physician Affirmation I have reviewed and agree with the above information. Electronic  Signature(s) Signed: 02/19/2018 5:23:22 PM By: Montey Hora Signed: 02/25/2018 1:45:00 AM By: Worthy Keeler PA-C Entered By: Worthy Keeler on 02/19/2018 11:34:51 Miguel Torres ([id number]) -------------------------------------------------------------------------------- SuperBill Details Patient Name: Miguel Torres Date of Service: 02/19/2018 Medical Record Number: [id number] Patient Account Number: [id number] Date of Birth/Sex: [birth year] (84 y.o. M) Treating RN: Montey Hora Primary Care Provider: Lelon Huh Other Clinician: Referring Provider: Lelon Huh Treating Provider/Extender: Melburn Hake, HOYT Weeks in Treatment: 4 Diagnosis Coding ICD-10 Codes Code Description E11.621 Type 2 diabetes mellitus with foot ulcer L97.526 Non-pressure chronic ulcer of other part of left foot with bone involvement without evidence of necrosis I89.0 Lymphedema, not elsewhere classified I73.89 Other specified peripheral vascular diseases Z89.511 Acquired absence of right leg below knee Facility Procedures CPT4 Code: [id number] Description: 01093 - WOUND CARE VISIT-LEV 2 EST PT  Modifier: Quantity: 1 Physician Procedures CPT4: Description Modifier Quantity Code [id number] 84069 - WC PHYS LEVEL 2 - EST PT 1 ICD-10 Diagnosis Description E11.621 Type 2 diabetes mellitus with foot ulcer L97.526 Non-pressure chronic ulcer of other part of left foot with bone involvement without  evidence of necrosis I89.0 Lymphedema, not elsewhere classified I73.89 Other specified peripheral vascular diseases Electronic Signature(s) Signed: 02/25/2018 1:45:00 AM By: Worthy Keeler PA-C Entered By: Worthy Keeler on 02/19/2018 11:36:01

## 2018-02-28 ENCOUNTER — Other Ambulatory Visit: Payer: Self-pay | Admitting: Family Medicine

## 2018-03-01 ENCOUNTER — Encounter: Payer: Self-pay | Admitting: Interventional Radiology

## 2018-03-01 ENCOUNTER — Ambulatory Visit
Admission: RE | Admit: 2018-03-01 | Discharge: 2018-03-01 | Disposition: A | Discharge status: Home / Self Care | Payer: Medicare Other | Source: Ambulatory Visit | Attending: Urology | Admitting: Urology

## 2018-03-01 ENCOUNTER — Encounter (INDEPENDENT_AMBULATORY_CARE_PROVIDER_SITE_OTHER): Payer: Medicare Other

## 2018-03-01 DIAGNOSIS — R339 Retention of urine, unspecified: Secondary | ICD-10-CM | POA: Insufficient documentation

## 2018-03-01 DIAGNOSIS — Z466 Encounter for fitting and adjustment of urinary device: Secondary | ICD-10-CM | POA: Diagnosis not present

## 2018-03-01 DIAGNOSIS — Z436 Encounter for attention to other artificial openings of urinary tract: Secondary | ICD-10-CM | POA: Diagnosis not present

## 2018-03-01 DIAGNOSIS — N401 Enlarged prostate with lower urinary tract symptoms: Secondary | ICD-10-CM | POA: Diagnosis not present

## 2018-03-01 DIAGNOSIS — N319 Neuromuscular dysfunction of bladder, unspecified: Secondary | ICD-10-CM | POA: Diagnosis not present

## 2018-03-01 DIAGNOSIS — E1122 Type 2 diabetes mellitus with diabetic chronic kidney disease: Secondary | ICD-10-CM | POA: Diagnosis not present

## 2018-03-01 HISTORY — PX: IR CATHETER TUBE CHANGE: IMG717

## 2018-03-01 MED ORDER — IOPAMIDOL (ISOVUE-300) INJECTION 61%
30.0000 mL | Freq: Once | INTRAVENOUS | Status: AC | PRN
  Administered 2018-03-01: 10 mL

## 2018-03-01 MED ORDER — SODIUM CHLORIDE 0.9 % IV SOLN: INTRAVENOUS | Status: DC

## 2018-03-01 MED ORDER — LIDOCAINE HCL (PF) 1 % IJ SOLN
INTRAMUSCULAR | Status: AC
  Filled 2018-03-01: qty 30

## 2018-03-01 MED ORDER — HEPARIN (PORCINE) IN NACL 1000-0.9 UT/500ML-% IV SOLN
INTRAVENOUS | Status: AC
  Filled 2018-03-01: qty 500

## 2018-03-01 NOTE — Progress Notes (Signed)
Spoke with Dr Barbie Banner /radiologist who states no need for labs to be drawn.

## 2018-03-01 NOTE — Discharge Instructions (Signed)
Suprapubic Catheter Replacement, Care After  Refer to this sheet in the next few weeks. These instructions provide you with information about caring for yourself after your procedure. Your health care provider may also give you more specific instructions. Your treatment has been planned according to current medical practices, but problems sometimes occur. Call your health care provider if you have any problems or questions after your procedure.  What can I expect after the procedure?  After your procedure, it is possible to have some discomfort around the opening in your abdomen.  Follow these instructions at home:  Caring for your skin around the catheter  Use a clean washcloth and soapy water to clean the skin around your catheter every day. Pat the area dry with a clean towel.   Do not pull on the catheter.   Do not use ointment or lotion on this area unless told by your health care provider.   Check your skin around the catheter every day for signs of infection. Check for:  ? Redness, swelling, or pain.  ? Fluid or blood.  ? Warmth.  ? Pus or a bad smell.    Caring for the catheter tube   Clean the catheter tube with soap and water as often as told by your health care provider.   Always make sure there are no twists or curls (kinks) in the catheter tube.  Emptying the collection bag  Empty the large collection bag every 8 hours. Empty the small collection bag when it is about ? full. To empty your large or small collection bag, take the following steps:   Always keep the bag below the level of the catheter. This keeps urine from flowing backwards into the catheter.   Hold the bag over the toilet or another container. Turn the valve (spigot) at the bottom of the bag to empty the urine.  ? Do not touch the opening of the spigot.  ? Do not let the opening touch the toilet or container.   Close the spigot tightly when the bag is empty.    Cleaning the collection bag    Clean the collection bag every 2-3  days, or as often as told by your health care provider. To do this, take the following steps:   Wash your hands with soap and water. If soap and water are not available, use hand sanitizer.   Disconnect the bag from the catheter and immediately attach a new bag to the catheter.   Empty the used bag completely.   Clean the used bag using one of the following methods:  ? Rinse the bag with warm water and soap.  ? Fill the bag with water and add 1 tsp of vinegar. Let it sit for about 30 minutes, then empty the bag.   Let the bag dry completely, and put it in a clean plastic bag before storing it.    General instructions   Always wash your hands before and after caring for your catheter and collection bag. Use a mild, fragrance-free soap. If soap and water are not available, use hand sanitizer.   Always make sure there are no leaks in the catheter or collection bag.   Drink enough fluid to keep your urine clear or pale yellow.   If you were prescribed an antibiotic medicine, take it as told by your health care provider. Do not stop taking the antibiotic even if you start to feel better.   Do not take baths, swim, or use   a hot tub.   Keep all follow-up appointments as told by your health care provider. This is important.  Contact a health care provider if:   You leak urine.   You have redness, swelling, or pain around your catheter opening.   You have fluid or blood coming from your catheter opening.   Your catheter opening feels warm to the touch.   You have pus or a bad smell coming from your catheter opening.   You have a fever or chills.   Your urine flow slows down.   Your urine becomes cloudy or smelly.  Get help right away if:   Your catheter comes out.   You feel nauseous.   You have back pain.   You have difficulty changing your catheter.   You have blood in your urine.   You have no urine flow for 1 hour.  This information is not intended to replace advice given to you by your health  care provider. Make sure you discuss any questions you have with your health care provider.  Document Released: 01/07/2011 Document Revised: 12/19/2015 Document Reviewed: 01/02/2015  [employer]vier Interactive Patient Education  2018 [employer]

## 2018-03-01 NOTE — OR Nursing (Signed)
Dr Barbie Banner in to consent pt with daughter. Noted cloudy urine no new orders

## 2018-03-01 NOTE — Procedures (Signed)
Suprapubic catheter exchange Upsized from 16 Fr to 18 Fr EBL 0 Comp 0

## 2018-03-01 NOTE — OR Nursing (Signed)
  Dorette Grate, RN  Registered Nurse    OR Nursing  Signed  Date of Service:  03/01/2018 2:51 PM          Signed         Show:Clear all [x] Manual[] Template[] Copied  Added by: [x] Dorette Grate, RN  [] Hover for details Attempted to call report to [employer]. No answer multiple tries. Daughter to take pt home. She will give discharge instructions to nurse and our phone number in event she ahs any questions. Pt discharged in daughters care via personal wheelchair.

## 2018-03-02 ENCOUNTER — Telehealth: Payer: Self-pay | Admitting: Radiology

## 2018-03-02 NOTE — Telephone Encounter (Signed)
-----   Message from Nori Riis, PA-C sent at 03/02/2018  7:37 AM EDT ----- Mr. Carlyon has an 32f SPT in now.  He is scheduled for something in vascular in November, but I think it is something else.   Would you be able to tell what it is?

## 2018-03-02 NOTE — Telephone Encounter (Signed)
Appointments in vascular are for an Unna wrap & follow up with Dr Delana Meyer - not related to SPT.   Attempted to reach daughter, Miguel Torres, to schedule apptointment for next SPT exchange to be done in the office (due 03/30/2018). Mailbox is full so unable to [facility]

## 2018-03-08 ENCOUNTER — Telehealth: Payer: Self-pay | Admitting: Urology

## 2018-03-08 ENCOUNTER — Encounter (INDEPENDENT_AMBULATORY_CARE_PROVIDER_SITE_OTHER): Payer: Medicare Other

## 2018-03-08 NOTE — Telephone Encounter (Signed)
Could not lm for patient Mailbox was full Mailed app Sharyn Lull

## 2018-03-08 NOTE — Telephone Encounter (Signed)
-----   Message from Nori Riis, PA-C sent at 03/05/2018  7:49 AM EDT ----- Mr. Siebert will need an office visit in one month for a SPT exchange on providers schedule.

## 2018-03-09 ENCOUNTER — Encounter: Payer: Self-pay | Admitting: Interventional Radiology

## 2018-03-10 ENCOUNTER — Encounter: Payer: Self-pay | Admitting: Family Medicine

## 2018-03-15 ENCOUNTER — Encounter (INDEPENDENT_AMBULATORY_CARE_PROVIDER_SITE_OTHER): Payer: Medicare Other

## 2018-03-16 NOTE — Telephone Encounter (Signed)
Unable to reach daughter, [name], on cell [phone] to reschedule SPT exchange in the office. [name] has previously stated that she doesn't work on Monday or Tuesday. Was able to reach son, [name], at [phone] who states he will try to reach [name] & have her return call. Will also need to notify [employer] of appointment at [phone].

## 2018-03-17 NOTE — Telephone Encounter (Signed)
Confirmed with daughter, [name], that appointment for SPT exchange scheduled 04/08/2018 is a good time & doesn't need to be rescheduled. Also confirmed appointment with Silva Bandy at [facility].

## 2018-03-22 ENCOUNTER — Encounter (INDEPENDENT_AMBULATORY_CARE_PROVIDER_SITE_OTHER): Payer: Medicare Other | Admitting: Vascular Surgery

## 2018-04-08 ENCOUNTER — Ambulatory Visit (INDEPENDENT_AMBULATORY_CARE_PROVIDER_SITE_OTHER): Payer: Medicare Other | Admitting: Urology

## 2018-04-08 ENCOUNTER — Encounter: Payer: Self-pay | Admitting: Urology

## 2018-04-08 ENCOUNTER — Other Ambulatory Visit: Payer: Self-pay

## 2018-04-08 VITALS — BP 173/89 | HR 80 | Ht 69.0 in

## 2018-04-08 DIAGNOSIS — R339 Retention of urine, unspecified: Secondary | ICD-10-CM

## 2018-04-08 NOTE — Progress Notes (Signed)
Suprapubic Cath Change Miguel Torres is a 84 yo pt who presents today s/p SPT placement by interventional radiology for urinary retention on 01/25/18 and upsizing to an 70 French catheter on 10/28.  He presents for initial SPT change.   The 18 FR catheter placed by IR was removed without difficulty, Site was cleaned and prepped in a sterile fashion with betadine.  A 18FR foley cath was replaced into the tract no complications were noted.  Patient tolerated well.   Preformed by: Dr. Nicki Reaper C. Stoioff  Follow up: Monthly SPT changes at his SNF and follow-up 1 year for cysto.  Jamas Lav, am acting as a scribe for Dr. Nicki Reaper C. Stoioff,  I, Abbie Sons, MD, have reviewed all documentation for this visit. The documentation on 04/08/18 for the exam, diagnosis, procedures, and orders are all accurate and complete.

## 2018-05-31 DIAGNOSIS — Z89511 Acquired absence of right leg below knee: Secondary | ICD-10-CM | POA: Diagnosis not present

## 2018-05-31 DIAGNOSIS — I5032 Chronic diastolic (congestive) heart failure: Secondary | ICD-10-CM | POA: Diagnosis not present

## 2018-05-31 DIAGNOSIS — Z789 Other specified health status: Secondary | ICD-10-CM | POA: Diagnosis not present

## 2018-05-31 DIAGNOSIS — I4892 Unspecified atrial flutter: Secondary | ICD-10-CM | POA: Diagnosis not present

## 2018-06-05 DIAGNOSIS — E1122 Type 2 diabetes mellitus with diabetic chronic kidney disease: Secondary | ICD-10-CM | POA: Diagnosis not present

## 2018-06-05 DIAGNOSIS — N401 Enlarged prostate with lower urinary tract symptoms: Secondary | ICD-10-CM | POA: Diagnosis not present

## 2018-06-05 DIAGNOSIS — N319 Neuromuscular dysfunction of bladder, unspecified: Secondary | ICD-10-CM | POA: Diagnosis not present

## 2018-06-07 DIAGNOSIS — N401 Enlarged prostate with lower urinary tract symptoms: Secondary | ICD-10-CM | POA: Diagnosis not present

## 2018-06-07 DIAGNOSIS — N319 Neuromuscular dysfunction of bladder, unspecified: Secondary | ICD-10-CM | POA: Diagnosis not present

## 2018-06-07 DIAGNOSIS — E1122 Type 2 diabetes mellitus with diabetic chronic kidney disease: Secondary | ICD-10-CM | POA: Diagnosis not present

## 2018-06-10 ENCOUNTER — Ambulatory Visit: Payer: Medicare Other | Admitting: Physician Assistant

## 2018-06-15 ENCOUNTER — Encounter: Payer: Medicare Other | Attending: Physician Assistant | Admitting: Physician Assistant

## 2018-06-15 DIAGNOSIS — Z89511 Acquired absence of right leg below knee: Secondary | ICD-10-CM | POA: Diagnosis not present

## 2018-06-15 DIAGNOSIS — I509 Heart failure, unspecified: Secondary | ICD-10-CM | POA: Insufficient documentation

## 2018-06-15 DIAGNOSIS — I110 Hypertensive heart disease with heart failure: Secondary | ICD-10-CM | POA: Diagnosis not present

## 2018-06-15 DIAGNOSIS — L97522 Non-pressure chronic ulcer of other part of left foot with fat layer exposed: Secondary | ICD-10-CM | POA: Diagnosis not present

## 2018-06-15 DIAGNOSIS — E11622 Type 2 diabetes mellitus with other skin ulcer: Secondary | ICD-10-CM | POA: Insufficient documentation

## 2018-06-15 DIAGNOSIS — E11621 Type 2 diabetes mellitus with foot ulcer: Secondary | ICD-10-CM | POA: Insufficient documentation

## 2018-06-15 DIAGNOSIS — L97822 Non-pressure chronic ulcer of other part of left lower leg with fat layer exposed: Secondary | ICD-10-CM | POA: Diagnosis not present

## 2018-06-15 DIAGNOSIS — I890 Lymphedema, not elsewhere classified: Secondary | ICD-10-CM | POA: Insufficient documentation

## 2018-06-15 DIAGNOSIS — I7389 Other specified peripheral vascular diseases: Secondary | ICD-10-CM | POA: Diagnosis not present

## 2018-06-17 DIAGNOSIS — N319 Neuromuscular dysfunction of bladder, unspecified: Secondary | ICD-10-CM | POA: Diagnosis not present

## 2018-06-17 DIAGNOSIS — E1122 Type 2 diabetes mellitus with diabetic chronic kidney disease: Secondary | ICD-10-CM | POA: Diagnosis not present

## 2018-06-17 DIAGNOSIS — N401 Enlarged prostate with lower urinary tract symptoms: Secondary | ICD-10-CM | POA: Diagnosis not present

## 2018-06-21 NOTE — Progress Notes (Addendum)
RAMIREZ, FULLBRIGHT ([id number]) Visit Report for 06/15/2018 Chief Complaint Document Details Patient Name: Miguel Torres, Miguel Torres Date of Service: 06/15/2018 8:45 AM Medical Record Number: [id number] Patient Account Number: [id number] Date of Birth/Sex: 1930/07/14 (85 y.o. M) Treating RN: Montey Hora Primary Care Provider: Lelon Huh Other Clinician: Referring Provider: Lelon Huh Treating Provider/Extender: Melburn Hake, HOYT Weeks in Treatment: 0 Information Obtained from: Patient Chief Complaint Left LE ulcers Electronic Signature(s) Signed: 06/20/2018 7:15:48 AM By: Worthy Keeler PA-C Entered By: Worthy Keeler on 06/15/2018 09:23:45 Miguel Torres ([id number]) -------------------------------------------------------------------------------- Debridement Details Patient Name: Miguel Torres Date of Service: 06/15/2018 8:45 AM Medical Record Number: [id number] Patient Account Number: [id number] Date of Birth/Sex: 06-27-1930 (85 y.o. M) Treating RN: Montey Hora Primary Care Provider: Lelon Huh Other Clinician: Referring Provider: Lelon Huh Treating Provider/Extender: Melburn Hake, HOYT Weeks in Treatment: 0 Debridement Performed for Wound #4 Left,Lateral Foot Assessment: Performed By: Physician STONE III, HOYT E., PA-C Debridement Type: Debridement Severity of Tissue Pre Fat layer exposed Debridement: Level of Consciousness (Pre- Awake and Alert procedure): Pre-procedure Verification/Time Yes - 09:32 Out Taken: Start Time: 09:32 Pain Control: Lidocaine 4% Topical Solution Total Area Debrided (L x W): 4 (cm) x 1 (cm) = 4 (cm) Tissue and other material Viable, Non-Viable, Slough, Subcutaneous, Skin: Dermis , Skin: Epidermis, Slough debrided: Level: Skin/Subcutaneous Tissue Debridement Description: Excisional Instrument: Curette Bleeding: Minimum Hemostasis Achieved: Pressure End Time: 09:37 Procedural Pain: 0 Post Procedural Pain:  0 Response to Treatment: Procedure was tolerated well Level of Consciousness Awake and Alert (Post-procedure): Post Debridement Measurements of Total Wound Length: (cm) 4 Width: (cm) 1 Depth: (cm) 0.2 Volume: (cm) 0.628 Character of Wound/Ulcer Post Debridement: Improved Severity of Tissue Post Debridement: Fat layer exposed Post Procedure Diagnosis Same as Pre-procedure Electronic Signature(s) Signed: 06/15/2018 4:52:44 PM By: Montey Hora Signed: 06/20/2018 7:15:48 AM By: Worthy Keeler PA-C Entered By: Montey Hora on 06/15/2018 09:38:47 Miguel Torres ([id number]) -------------------------------------------------------------------------------- HPI Details Patient Name: Miguel Torres Date of Service: 06/15/2018 8:45 AM Medical Record Number: [id number] Patient Account Number: [id number] Date of Birth/Sex: March 05, 1931 (85 y.o. M) Treating RN: Montey Hora Primary Care Provider: Lelon Huh Other Clinician: Referring Provider: Lelon Huh Treating Provider/Extender: Melburn Hake, HOYT Weeks in Treatment: 0 History of Present Illness Associated Signs and Symptoms: Patient has a history of diabetes mellitus type II, lymphedema, a right below knee amputation which was performed 10 years ago, and proof of vascular disease which is managed by [facility], Dr. dew HPI Description: 01/19/18 on evaluation today patient presents for initial evaluation and clinic due to issues that he has been having with a left lateral foot ulcer which has been present for at minimum two months although this may have actually been longer before the family knew of the wound. Currently he has been seen [facility] where they have performed arterial studies. I did have those for review today which did show that he has mild left lower extremity arterial disease with a abnormal left toe brachial index. His ABI was 0.93 with a TBI of 0.26. Upon further evaluation  it appears that the patient actually was recommended to have an angiogram by Dr. dew. This was due to the fact that there was concerned about the low TBI and the fact that he may not be perfusion all the way down into his foot. With that being said the final decision as to whether or not to proceed with this has not been made as of  yet according to the patient's daughter who was present for evaluation at this time today. The patient has no evidence of infection at this time. With that being said there is bone exposed in the base of the wound unfortunately. This obviously may indicate a more significant issue underlying just the open wound. Other than that he actually appears to have a significant amount of epithelialization noted which is excellent news. No fevers, chills, nausea, or vomiting noted at this time. He has had an x-ray which revealed no evidence of osteomyelitis this was performed July 2019. No MRI has been performed at this point. 01/28/18 on evaluation today patient actually appears to be doing about the same in regard to his left lateral foot ulcer. He does not have as much is the way of necrotic tissue overlying the surface of the wound in fact no sharp debridement was necessary. With that being said he does have some maceration noted. He may benefit from adding alginate over the Prisma along the lateral aspect of his wound. In regard to the Kerlex and Coban wrap it appears that this actually was not wrapped as high as it should've been and therefore the patient felt blisters on the lateral portion of his upper left lower extremity. 02/08/18 on evaluation today patient presents today for follow-up after having had his MRI which was performed on 02/05/18. This MRI showed that there did not appear to be any evidence of osteomyelitis of the left foot or ankle at this time. He did have a prior midfoot arthrodesis with solid osseous fusion. Nonetheless in general the patient seems to be  doing excellent at this point in time which is great news. Overall very pleased with the fact there is no infection and the fact that his wound actually seems to be doing better. 02/19/18 on evaluation today patient appears to be doing excellent in regard to his foot ulcer. He's been tolerating the dressing changes without complication. Fortunately there's no sign of infection and the wound appears to be completely healed. The patient and his daughter are both extremely pleased. Readmission: 06/15/18 on evaluation today patient appears to be doing a little poorly in regard to his left lower extremity where he has significant swelling. He's previously had swelling here as well unfortunately due to the swelling that occurred over the past week he has blisters that arose on his leg and then subsequently the scar along the left lateral foot which was previously closed when he was here in the clinic reopened I believe this is simply due to the fact that it's a weak area that due to the swelling the fluid found this region to get out of. Fortunately there's no signs of infection at this time. No fevers, chills, nausea, or vomiting noted at this time. Patient has no pain at any site which is good news. He has been spending a lot of time in his wheelchair with his feet down I think this is bad for him as well. He also apparently drink a lot of pickle juice specifically just prior to this happening I'm not sure of the salt content but I wonder if this may have had something to do with his increased swelling. He does have congestive heart failure but doesn't have any trouble or difficulty breathing right now this is good news. SAMPSON, SELF ([id number]) Electronic Signature(s) Signed: 06/20/2018 7:15:48 AM By: Worthy Keeler PA-C Entered By: Worthy Keeler on 06/15/2018 09:41:54 Miguel Torres  ([id number]) -------------------------------------------------------------------------------- Physical Exam  Details Patient Name: ALEXANDROS, EWAN Date of Service: 06/15/2018 8:45 AM Medical Record Number: [id number] Patient Account Number: [id number] Date of Birth/Sex: Jul 28, 1930 (85 y.o. M) Treating RN: Montey Hora Primary Care Provider: Lelon Huh Other Clinician: Referring Provider: Lelon Huh Treating Provider/Extender: Melburn Hake, HOYT Weeks in Treatment: 0 Constitutional patient is hypertensive.. pulse regular and within target range for patient.[name] respirations regular, non-labored and within target range for patient.[name] temperature within target range for patient.. Well-nourished and well-hydrated in no acute distress. Eyes conjunctiva clear no eyelid edema noted. pupils equal round and reactive to light and accommodation. Ears, Nose, Mouth, and Throat no gross abnormality of ear auricles or external auditory canals. patient has hearing loss. mucus membranes moist. Respiratory normal breathing without difficulty. clear to auscultation bilaterally. Cardiovascular regular rate and rhythm with normal S1, S2. 2+ pitting edema of the bilateral lower extremities. Gastrointestinal (GI) soft, non-tender, non-distended, +BS. no ventral hernia noted. Musculoskeletal Patient unable to walk without assistance. Right BKA. Psychiatric this patient is able to make decisions and demonstrates good insight into disease process. Alert and Oriented x 3. pleasant and cooperative. Notes Patient's wound bed currently shows evidence of being fairly good as far as the blistered areas that are open are concerned. With that being said he does have his blood pressure somewhat elevated which has also been accompanied everything else going on. He doesn't really walk as much as a use to which I think is probably contributing as well and unfortunately does seem to have quite a bit of edema during  evaluation today. Electronic Signature(s) Signed: 06/20/2018 7:15:48 AM By: Worthy Keeler PA-C Entered By: Worthy Keeler on 06/15/2018 09:42:49 Miguel Torres ([id number]) -------------------------------------------------------------------------------- Physician Orders Details Patient Name: Miguel Torres Date of Service: 06/15/2018 8:45 AM Medical Record Number: [id number] Patient Account Number: [id number] Date of Birth/Sex: 1930/10/14 (85 y.o. M) Treating RN: Montey Hora Primary Care Provider: Lelon Huh Other Clinician: Referring Provider: Lelon Huh Treating Provider/Extender: Melburn Hake, HOYT Weeks in Treatment: 0 Verbal / Phone Orders: No Diagnosis Coding ICD-10 Coding Code Description E11.621 Type 2 diabetes mellitus with foot ulcer I89.0 Lymphedema, not elsewhere classified L97.822 Non-pressure chronic ulcer of other part of left lower leg with fat layer exposed L97.522 Non-pressure chronic ulcer of other part of left foot with fat layer exposed I73.89 Other specified peripheral vascular diseases Z89.511 Acquired absence of right leg below knee Wound Cleansing Wound #2 Left,Proximal Lower Leg o Clean wound with Normal Saline. o May Shower, gently pat wound dry prior to applying new dressing. - may remove wrap and bathe leg in soap and water and then replace wrap Wound #3 Distal,Anterior Lower Leg o Clean wound with Normal Saline. o May Shower, gently pat wound dry prior to applying new dressing. - may remove wrap and bathe leg in soap and water and then replace wrap Wound #4 Left,Lateral Foot o Clean wound with Normal Saline. o May Shower, gently pat wound dry prior to applying new dressing. - may remove wrap and bathe leg in soap and water and then replace wrap Wound #5 Left,Posterior Lower Leg o Clean wound with Normal Saline. o May Shower, gently pat wound dry prior to applying new dressing. - may remove wrap and bathe leg in  soap and water and then replace wrap Primary Wound Dressing Wound #2 Left,Proximal Lower Leg o Silver Alginate Wound #3 Distal,Anterior Lower Leg o Silver Alginate Wound #4 Left,Lateral Foot o Silver Alginate Wound #5 Left,Posterior Lower Leg o Silver Alginate Heikkila,  BEREL NAJJAR ([id number]) Secondary Dressing Wound #2 Left,Proximal Lower Leg o ABD pad o XtraSorb Wound #3 Distal,Anterior Lower Leg o ABD pad o XtraSorb Wound #4 Left,Lateral Foot o ABD pad o XtraSorb Wound #5 Left,Posterior Lower Leg o ABD pad o XtraSorb Dressing Change Frequency Wound #2 Left,Proximal Lower Leg o Three times weekly - SNF to rewrap legs 3 times weekly Wound #3 Distal,Anterior Lower Leg o Three times weekly - SNF to rewrap legs 3 times weekly Wound #4 Left,Lateral Foot o Three times weekly - SNF to rewrap legs 3 times weekly Wound #5 Left,Posterior Lower Leg o Three times weekly - SNF to rewrap legs 3 times weekly Follow-up Appointments Wound #2 Left,Proximal Lower Leg o Return Appointment in 1 week. Wound #3 Distal,Anterior Lower Leg o Return Appointment in 1 week. Wound #4 Left,Lateral Foot o Return Appointment in 1 week. Wound #5 Left,Posterior Lower Leg o Return Appointment in 1 week. Edema Control Wound #2 Left,Proximal Lower Leg o Kerlix and Coban - Right Lower Extremity - SNF TO WRAP USING KERLIX AND COBAN LIGHTLY FROM THE TOES TO 3CM BELOW THE KNEE o 3 Layer Compression System - Right Lower Extremity - THIS WRAP WILL BE USED BY [facility] ONLY Wound #3 Distal,Anterior Lower Leg o Kerlix and Coban - Right Lower Extremity - SNF TO WRAP USING KERLIX AND COBAN LIGHTLY FROM THE TOES TO 3CM BELOW THE KNEE o 3 Layer Compression System - Right Lower Extremity - THIS WRAP WILL BE USED BY [facility] ONLY TROYCE, GIESKE. ([id number]) Wound #4 Left,Lateral Foot o Kerlix and Coban - Right Lower  Extremity - SNF TO WRAP USING KERLIX AND COBAN LIGHTLY FROM THE TOES TO 3CM BELOW THE KNEE o 3 Layer Compression System - Right Lower Extremity - THIS WRAP WILL BE USED BY [facility] ONLY Wound #5 Left,Posterior Lower Leg o Kerlix and Coban - Right Lower Extremity - SNF TO WRAP USING KERLIX AND COBAN LIGHTLY FROM THE TOES TO 3CM BELOW THE KNEE o 3 Layer Compression System - Right Lower Extremity - THIS WRAP WILL BE USED BY [facility] ONLY Electronic Signature(s) Signed: 06/15/2018 4:52:44 PM By: Montey Hora Signed: 06/20/2018 7:15:48 AM By: Worthy Keeler PA-C Entered By: Montey Hora on 06/15/2018 09:38:21 Miguel Torres ([id number]) -------------------------------------------------------------------------------- Problem List Details Patient Name: Miguel Torres Date of Service: 06/15/2018 8:45 AM Medical Record Number: [id number] Patient Account Number: [id number] Date of Birth/Sex: Jul 30, 1930 (85 y.o. M) Treating RN: Montey Hora Primary Care Provider: Lelon Huh Other Clinician: Referring Provider: Lelon Huh Treating Provider/Extender: Melburn Hake, HOYT Weeks in Treatment: 0 Active Problems ICD-10 Evaluated Encounter Code Description Active Date Today Diagnosis E11.621 Type 2 diabetes mellitus with foot ulcer 06/15/2018 No Yes I89.0 Lymphedema, not elsewhere classified 06/15/2018 No Yes L97.822 Non-pressure chronic ulcer of other part of left lower leg with 06/15/2018 No Yes fat layer exposed L97.522 Non-pressure chronic ulcer of other part of left foot with fat 06/15/2018 No Yes layer exposed I73.89 Other specified peripheral vascular diseases 06/15/2018 No Yes Z89.511 Acquired absence of right leg below knee 06/15/2018 No Yes Inactive Problems Resolved Problems Electronic Signature(s) Signed: 06/20/2018 7:15:48 AM By: Worthy Keeler PA-C Entered By: Worthy Keeler on 06/15/2018 09:23:27 Miguel Torres  ([id number]) -------------------------------------------------------------------------------- Progress Note Details Patient Name: Miguel Torres Date of Service: 06/15/2018 8:45 AM Medical Record Number: [id number] Patient Account Number: [id number] Date of Birth/Sex: [birth year] (85 y.o. M) Treating RN: Montey Hora Primary  Care Provider: Lelon Huh Other Clinician: Referring Provider: Lelon Huh Treating Provider/Extender: Melburn Hake, HOYT Weeks in Treatment: 0 Subjective Chief Complaint Information obtained from Patient Left LE ulcers History of Present Illness (HPI) The following HPI elements were documented for the patient's wound: Associated Signs and Symptoms: Patient has a history of diabetes mellitus type II, lymphedema, a right below knee amputation which was performed 10 years ago, and proof of vascular disease which is managed by [facility], Dr. dew 01/19/18 on evaluation today patient presents for initial evaluation and clinic due to issues that he has been having with a left lateral foot ulcer which has been present for at minimum two months although this may have actually been longer before the family knew of the wound. Currently he has been seen [facility] where they have performed arterial studies. I did have those for review today which did show that he has mild left lower extremity arterial disease with a abnormal left toe brachial index. His ABI was 0.93 with a TBI of 0.26. Upon further evaluation it appears that the patient actually was recommended to have an angiogram by Dr. dew. This was due to the fact that there was concerned about the low TBI and the fact that he may not be perfusion all the way down into his foot. With that being said the final decision as to whether or not to proceed with this has not been made as of yet according to the patient's daughter who was present for evaluation at this time today. The patient  has no evidence of infection at this time. With that being said there is bone exposed in the base of the wound unfortunately. This obviously may indicate a more significant issue underlying just the open wound. Other than that he actually appears to have a significant amount of epithelialization noted which is excellent news. No fevers, chills, nausea, or vomiting noted at this time. He has had an x-ray which revealed no evidence of osteomyelitis this was performed July 2019. No MRI has been performed at this point. 01/28/18 on evaluation today patient actually appears to be doing about the same in regard to his left lateral foot ulcer. He does not have as much is the way of necrotic tissue overlying the surface of the wound in fact no sharp debridement was necessary. With that being said he does have some maceration noted. He may benefit from adding alginate over the Prisma along the lateral aspect of his wound. In regard to the Kerlex and Coban wrap it appears that this actually was not wrapped as high as it should've been and therefore the patient felt blisters on the lateral portion of his upper left lower extremity. 02/08/18 on evaluation today patient presents today for follow-up after having had his MRI which was performed on 02/05/18. This MRI showed that there did not appear to be any evidence of osteomyelitis of the left foot or ankle at this time. He did have a prior midfoot arthrodesis with solid osseous fusion. Nonetheless in general the patient seems to be doing excellent at this point in time which is great news. Overall very pleased with the fact there is no infection and the fact that his wound actually seems to be doing better. 02/19/18 on evaluation today patient appears to be doing excellent in regard to his foot ulcer. He's been tolerating the dressing changes without complication. Fortunately there's no sign of infection and the wound appears to be completely healed.  The patient  and his daughter are both extremely pleased. Readmission: 06/15/18 on evaluation today patient appears to be doing a little poorly in regard to his left lower extremity where he has significant swelling. He's previously had swelling here as well unfortunately due to the swelling that occurred over the past week he has blisters that arose on his leg and then subsequently the scar along the left lateral foot which was previously closed when he was here in the clinic reopened I believe this is simply due to the fact that it's a weak area that due to the [place] ([id number]) swelling the fluid found this region to get out of. Fortunately there's no signs of infection at this time. No fevers, chills, nausea, or vomiting noted at this time. Patient has no pain at any site which is good news. He has been spending a lot of time in his wheelchair with his feet down I think this is bad for him as well. He also apparently drink a lot of pickle juice specifically just prior to this happening I'm not sure of the salt content but I wonder if this may have had something to do with his increased swelling. He does have congestive heart failure but doesn't have any trouble or difficulty breathing right now this is good news. Wound History Patient presents with 3 open wounds that have been present for approximately 06/11/2018. Patient has been treating wounds in the following manner: unna, coban, xeroform. Laboratory tests have not been performed in the last month. Patient reportedly has not tested positive for an antibiotic resistant organism. Patient reportedly has not tested positive for osteomyelitis. Patient reportedly has had testing performed to evaluate circulation in the legs. Patient experiences the following problems associated with their wounds: swelling. Patient History Information obtained from Patient. Allergies No Known Drug Allergies Family History Diabetes - Mother, Heart Disease -  Mother, Hypertension - Mother, No family history of Kidney Disease, Lung Disease, Seizures, Stroke, Thyroid Problems, Tuberculosis. Social History Never smoker, Marital Status - Married, Alcohol Use - Never, Drug Use - No History, Caffeine Use - Daily. Medical History Eyes Patient has history of Glaucoma Denies history of Cataracts, Optic Neuritis Ear/Nose/Mouth/Throat Denies history of Chronic sinus problems/congestion, Middle ear problems Hematologic/Lymphatic Denies history of Anemia, Hemophilia, Human Immunodeficiency Virus, Lymphedema, Sickle Cell Disease Respiratory Denies history of Aspiration, Asthma, Chronic Obstructive Pulmonary Disease (COPD), Pneumothorax, Sleep Apnea, Tuberculosis Cardiovascular Patient has history of Congestive Heart Failure, Hypertension Denies history of Angina, Arrhythmia, Coronary Artery Disease, Deep Vein Thrombosis, Hypotension, Myocardial Infarction, Peripheral Arterial Disease, Peripheral Venous Disease, Phlebitis, Vasculitis Gastrointestinal Denies history of Cirrhosis , Colitis, Crohn s, Hepatitis A, Hepatitis B, Hepatitis C Endocrine Patient has history of Type II Diabetes Denies history of Type I Diabetes Genitourinary Denies history of End Stage Renal Disease Immunological Denies history of Lupus Erythematosus, Raynaud s, Scleroderma Integumentary (Skin) Denies history of History of Burn, History of pressure wounds Musculoskeletal Patient has history of Gout, Osteoarthritis Denies history of Rheumatoid Arthritis, Osteomyelitis Neurologic Patient has history of Neuropathy HUBBERT, LANDRIGAN. ([id number]) Denies history of Dementia, Quadriplegia, Paraplegia, Seizure Disorder Oncologic Denies history of Received Chemotherapy, Received Radiation Psychiatric Denies history of Anorexia/bulimia, Confinement Anxiety Objective Constitutional patient is hypertensive.. pulse regular and within target range for patient.[name] respirations regular,  non-labored and within target range for patient.[name] temperature within target range for patient.. Well-nourished and well-hydrated in no acute distress. Vitals Time Taken: 8:38 AM, Height: 68 in, Source: Stated, Weight: 218 lbs, Source: Stated,  BMI: 33.1, Temperature: 98.2 F, Pulse: 84 bpm, Respiratory Rate: 18 breaths/min, Blood Pressure: 182/95 mmHg. Eyes conjunctiva clear no eyelid edema noted. pupils equal round and reactive to light and accommodation. Ears, Nose, Mouth, and Throat no gross abnormality of ear auricles or external auditory canals. patient has hearing loss. mucus membranes moist. Respiratory normal breathing without difficulty. clear to auscultation bilaterally. Cardiovascular regular rate and rhythm with normal S1, S2. 2+ pitting edema of the bilateral lower extremities. Gastrointestinal (GI) soft, non-tender, non-distended, +BS. no ventral hernia noted. Musculoskeletal Patient unable to walk without assistance. Right BKA. Psychiatric this patient is able to make decisions and demonstrates good insight into disease process. Alert and Oriented x 3. pleasant and cooperative. General Notes: Patient's wound bed currently shows evidence of being fairly good as far as the blistered areas that are open are concerned. With that being said he does have his blood pressure somewhat elevated which has also been accompanied everything else going on. He doesn't really walk as much as a use to which I think is probably contributing as well and unfortunately does seem to have quite a bit of edema during evaluation today. Integumentary (Hair, Skin) Wound #2 status is Open. Original cause of wound was Gradually Appeared. The wound is located on the Left,Proximal Lower Leg. The wound measures 7cm length x 5cm width x 0.1cm depth; 27.489cm^2 area and 2.749cm^3 volume. There is Fat Layer (Subcutaneous Tissue) Exposed exposed. There is no tunneling or undermining noted. There is a large  amount of serous drainage noted. The wound margin is flat and intact. There is no granulation within the wound bed. There is a large TANOR, GLASPY. ([id number]) (67-100%) amount of necrotic tissue within the wound bed including Adherent Slough. The periwound skin appearance exhibited: Dry/Scaly. The periwound skin appearance did not exhibit: Callus, Crepitus, Excoriation, Induration, Rash, Scarring, Maceration, Atrophie Blanche, Cyanosis, Ecchymosis, Hemosiderin Staining, Mottled, Pallor, Rubor, Erythema. Wound #3 status is Open. Original cause of wound was Gradually Appeared. The wound is located on the [facility] Lower Leg. The wound measures 10cm length x 7cm width x 0.1cm depth; 54.978cm^2 area and 5.498cm^3 volume. There is Fat Layer (Subcutaneous Tissue) Exposed exposed. There is no tunneling or undermining noted. There is a large amount of serous drainage noted. The wound margin is flat and intact. There is no granulation within the wound bed. There is a large (67-100%) amount of necrotic tissue within the wound bed including Adherent Slough. The periwound skin appearance exhibited: Dry/Scaly. The periwound skin appearance did not exhibit: Callus, Crepitus, Excoriation, Induration, Rash, Scarring, Maceration, Atrophie Blanche, Cyanosis, Ecchymosis, Hemosiderin Staining, Mottled, Pallor, Rubor, Erythema. Wound #4 status is Open. Original cause of wound was Gradually Appeared. The wound is located on the Left,Lateral Foot. The wound measures 4cm length x 1cm width x 0.1cm depth; 3.142cm^2 area and 0.314cm^3 volume. There is Fat Layer (Subcutaneous Tissue) Exposed exposed. There is no tunneling or undermining noted. There is a large amount of serous drainage noted. The wound margin is flat and intact. There is no granulation within the wound bed. There is a large (67-100%) amount of necrotic tissue within the wound bed including Adherent Slough. The periwound skin appearance  exhibited: Dry/Scaly, Maceration. The periwound has tenderness on palpation. Wound #5 status is Open. Original cause of wound was Gradually Appeared. The wound is located on the Left,Posterior Lower Leg. The wound measures 2cm length x 5cm width x 0.1cm depth; 7.854cm^2 area and 0.785cm^3 volume. There is Fat Layer (Subcutaneous Tissue) Exposed exposed.  There is no tunneling or undermining noted. There is a large amount of serous drainage noted. The wound margin is flat and intact. There is no granulation within the wound bed. There is a large (67-100%) amount of necrotic tissue within the wound bed including Adherent Slough. The periwound skin appearance exhibited: Dry/Scaly. The periwound skin appearance did not exhibit: Callus, Crepitus, Excoriation, Induration, Rash, Scarring, Maceration, Atrophie Blanche, Cyanosis, Ecchymosis, Hemosiderin Staining, Mottled, Pallor, Rubor, Erythema. 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 There was a Excisional Skin/Subcutaneous Tissue Debridement with a total area of 4 sq cm performed by STONE III, HOYT E., PA-C. With the following instrument(s): Curette to remove Viable and Non-Viable tissue/material. Material removed includes Subcutaneous Tissue, Slough, Skin: Dermis, and Skin: Epidermis after achieving pain control using Lidocaine 4% Topical Solution. No specimens were taken. A time out was conducted at 09:32, prior to the start of the procedure.  A Minimum amount of bleeding was controlled with Pressure. The procedure was tolerated well with a pain level of 0 throughout and a pain level of 0 following the procedure. Post Debridement Measurements: 4cm length x 1cm width x 0.2cm depth; 0.628cm^3 volume. GILDARDO, TICKNER ([id number]) Character of Wound/Ulcer Post Debridement is improved. Severity of Tissue Post Debridement is: Fat layer exposed. Post procedure Diagnosis Wound #4: Same as Pre-Procedure Plan Wound Cleansing: Wound #2 Left,Proximal Lower Leg: Clean wound with Normal Saline. May Shower, gently pat wound dry prior to applying new dressing. - may remove wrap and bathe leg in soap and water and then replace wrap Wound #3 Distal,Anterior Lower Leg: Clean wound with Normal Saline. May Shower, gently pat wound dry prior to applying new dressing. - may remove wrap and bathe leg in soap and water and then replace wrap Wound #4 Left,Lateral Foot: Clean wound with Normal Saline. May Shower, gently pat wound dry prior to applying new dressing. - may remove wrap and bathe leg in soap and water and then replace wrap Wound #5 Left,Posterior Lower Leg: Clean wound with Normal Saline. May Shower, gently pat wound dry prior to applying new dressing. - may remove wrap and bathe leg in soap and water and then replace wrap Primary Wound Dressing: Wound #2 Left,Proximal Lower Leg: Silver Alginate Wound #3 Distal,Anterior Lower Leg: Silver Alginate Wound #4 Left,Lateral Foot: Silver Alginate Wound #5 Left,Posterior Lower Leg: Silver Alginate Secondary Dressing: Wound #2 Left,Proximal Lower Leg: ABD pad XtraSorb Wound #3 Distal,Anterior Lower Leg: ABD pad XtraSorb Wound #4 Left,Lateral Foot: ABD pad XtraSorb Wound #5 Left,Posterior Lower Leg: ABD pad XtraSorb Dressing Change Frequency: Wound #2 Left,Proximal Lower Leg: Three times weekly - SNF to rewrap legs 3 times weekly Wound #3 Distal,Anterior Lower Leg: Three  times weekly - SNF to rewrap legs 3 times weekly Wound #4 Left,Lateral Foot: Three times weekly - SNF to rewrap legs 3 times weekly Wound #5 Left,Posterior Lower Leg: Three times weekly - SNF to rewrap legs 3 times weekly Follow-up Appointments: CRUISE, BAUMGARDNER ([id number]) Wound #2 Left,Proximal Lower Leg: Return Appointment in 1 week. Wound #3 Distal,Anterior Lower  Leg: Return Appointment in 1 week. Wound #4 Left,Lateral Foot: Return Appointment in 1 week. Wound #5 Left,Posterior Lower Leg: Return Appointment in 1 week. Edema Control: Wound #2 Left,Proximal Lower Leg: Kerlix and Coban - Right Lower Extremity - SNF TO WRAP USING KERLIX AND COBAN LIGHTLY FROM THE TOES TO 3CM BELOW THE KNEE 3 Layer Compression System - Right Lower Extremity - THIS WRAP WILL BE USED BY [facility] ONLY Wound #3 Distal,Anterior Lower Leg: Kerlix and Coban - Right Lower Extremity - SNF TO WRAP USING KERLIX AND COBAN LIGHTLY FROM THE TOES TO 3CM BELOW THE KNEE 3 Layer Compression System - Right Lower Extremity - THIS WRAP WILL BE USED BY [facility] ONLY Wound #4 Left,Lateral Foot: Kerlix and Coban - Right Lower Extremity - SNF TO WRAP USING KERLIX AND COBAN LIGHTLY FROM THE TOES TO 3CM BELOW THE KNEE 3 Layer Compression System - Right Lower Extremity - THIS WRAP WILL BE USED BY [facility] ONLY Wound #5 Left,Posterior Lower Leg: Kerlix and Coban - Right Lower Extremity - SNF TO WRAP USING KERLIX AND COBAN LIGHTLY FROM THE TOES TO 3CM BELOW THE KNEE 3 Layer Compression System - Right Lower Extremity - THIS WRAP WILL BE USED BY [facility] ONLY My suggestion at this point is gonna be that we go ahead and initiate the above wound care measures. We'll use a three layer compression wrap. The clinic and subsequently have them perform a Kerlex and Coban wrap at the facility. Silver alginate will be used as a dressing choice. If anything changes  worsens meantime they will contact the office let me know otherwise will see were things stand at follow-up. Please see above for specific wound care orders. We will see patient for re-evaluation in 1 week(s) here in the clinic. If anything worsens or changes patient will contact our office for additional recommendations. Electronic Signature(s) Signed: 07/28/2018 11:36:31 PM By: Worthy Keeler PA-C Previous Signature: 06/20/2018 7:15:48 AM Version By: Worthy Keeler PA-C Entered By: Worthy Keeler on 07/28/2018 23:29:46 Miguel Torres ([id number]) -------------------------------------------------------------------------------- ROS/PFSH Details Patient Name: Miguel Torres Date of Service: 06/15/2018 8:45 AM Medical Record Number: [id number] Patient Account Number: [id number] Date of Birth/Sex: 11/21/1930 (85 y.o. M) Treating RN: Harold Barban Primary Care Provider: Lelon Huh Other Clinician: Referring Provider: Lelon Huh Treating Provider/Extender: Melburn Hake, HOYT Weeks in Treatment: 0 Information Obtained From Patient Wound History Do you currently have one or more open woundso Yes How many open wounds do you currently haveo 3 Approximately how long have you had your woundso 06/11/2018 How have you been treating your wound(s) until nowo unna, coban, xeroform Has your wound(s) ever healed and then re-openedo No Have you had any lab work done in the past montho No Have you tested positive for an antibiotic resistant organism (MRSA, VRE)o No Have you tested positive for osteomyelitis (bone infection)o No Have you had any tests for circulation on your legso Yes Who ordered the testo [facility] doctor Where was the test doneo [facility] Have you had other problems associated with your woundso Swelling Eyes Medical History: Positive for: Glaucoma Negative for: Cataracts; Optic Neuritis Ear/Nose/Mouth/Throat Medical History: Negative for: Chronic sinus  problems/congestion; Middle ear problems Hematologic/Lymphatic Medical History: Negative for: Anemia; Hemophilia; Human Immunodeficiency Virus; Lymphedema; Sickle Cell Disease Respiratory Medical History: Negative for: Aspiration; Asthma; Chronic Obstructive Pulmonary Disease (COPD); Pneumothorax; Sleep Apnea; Tuberculosis Cardiovascular Medical History: Positive for: Congestive Heart Failure; Hypertension Negative  for: Angina; Arrhythmia; Coronary Artery Disease; Deep Vein Thrombosis; Hypotension; Myocardial Infarction; Peripheral Arterial Disease; Peripheral Venous Disease; Phlebitis; Vasculitis Gastrointestinal RAMESSES, CRAMPTON ([id number]) Medical History: Negative for: Cirrhosis ; Colitis; Crohnos; Hepatitis A; Hepatitis B; Hepatitis C Endocrine Medical History: Positive for: Type II Diabetes Negative for: Type I Diabetes Time with diabetes: 10 years Treated with: Oral agents Blood sugar tested every day: Yes Tested : Genitourinary Medical History: Negative for: End Stage Renal Disease Immunological Medical History: Negative for: Lupus Erythematosus; Raynaudos; Scleroderma Integumentary (Skin) Medical History: Negative for: History of Burn; History of pressure wounds Musculoskeletal Medical History: Positive for: Gout; Osteoarthritis Negative for: Rheumatoid Arthritis; Osteomyelitis Neurologic Medical History: Positive for: Neuropathy Negative for: Dementia; Quadriplegia; Paraplegia; Seizure Disorder Oncologic Medical History: Negative for: Received Chemotherapy; Received Radiation Psychiatric Medical History: Negative for: Anorexia/bulimia; Confinement Anxiety HBO Extended History Items Eyes: Glaucoma Immunizations Pneumococcal Vaccine: Received Pneumococcal Vaccination: Yes Implantable Devices HOANG, PETTINGILL ([id number]) Family and Social History Diabetes: Yes - Mother; Heart Disease: Yes - Mother; Hypertension: Yes - Mother; Kidney Disease: No;  Lung Disease: No; Seizures: No; Stroke: No; Thyroid Problems: No; Tuberculosis: No; Never smoker; Marital Status - Married; Alcohol Use: Never; Drug Use: No History; Caffeine Use: Daily; Advanced Directives: Yes (Not Provided); Patient does not want information on Advanced Directives; Living Will: Yes (Not Provided); Medical Power of Attorney: Yes (Copy provided) Electronic Signature(s) Signed: 06/20/2018 7:15:48 AM By: Worthy Keeler PA-C Signed: 06/21/2018 9:36:27 AM By: Harold Barban Entered By: Harold Barban on 06/15/2018 08:49:59 Miguel Torres ([id number]) -------------------------------------------------------------------------------- SuperBill Details Patient Name: Miguel Torres Date of Service: 06/15/2018 Medical Record Number: [id number] Patient Account Number: [id number] Date of Birth/Sex: [birth year] (85 y.o. M) Treating RN: Montey Hora Primary Care Provider: Lelon Huh Other Clinician: Referring Provider: Lelon Huh Treating Provider/Extender: Melburn Hake, HOYT Weeks in Treatment: 0 Diagnosis Coding ICD-10 Codes Code Description E11.621 Type 2 diabetes mellitus with foot ulcer I89.0 Lymphedema, not elsewhere classified L97.822 Non-pressure chronic ulcer of other part of left lower leg with fat layer exposed L97.522 Non-pressure chronic ulcer of other part of left foot with fat layer exposed I73.89 Other specified peripheral vascular diseases Z89.511 Acquired absence of right leg below knee Facility Procedures CPT4 Code: 71245809 Description: 98338 - DEB SUBQ TISSUE 20 SQ CM/< ICD-10 Diagnosis Description L97.522 Non-pressure chronic ulcer of other part of left foot with fat Modifier: layer exposed Quantity: 1 Physician Procedures CPT4 Code Description: 2505397 99214 - WC PHYS LEVEL 4 - EST PT ICD-10 Diagnosis Description E11.621 Type 2 diabetes mellitus with foot ulcer I89.0 Lymphedema, not elsewhere classified L97.822 Non-pressure chronic ulcer of  other part of left lower leg  with L97.522 Non-pressure chronic ulcer of other part of left foot with fat Modifier: 25 fat layer expose layer exposed Quantity: 1 d CPT4 Code Description: 6734193 79024 - WC PHYS SUBQ TISS 20 SQ CM ICD-10 Diagnosis Description L97.522 Non-pressure chronic ulcer of other part of left foot with fat Modifier: layer exposed Quantity: 1 Electronic Signature(s) Signed: 06/20/2018 7:15:48 AM By: Worthy Keeler PA-C Entered By: Worthy Keeler on 06/15/2018 09:43:37

## 2018-06-21 NOTE — Progress Notes (Signed)
HODGE, STACHNIK ([id number]) Visit Report for 06/15/2018 Abuse/Suicide Risk Screen Details Patient Name: Miguel Torres, Miguel Torres Date of Service: 06/15/2018 8:45 AM Medical Record Number: [id number] Patient Account Number: [id number] Date of Birth/Sex: Aug 28, 1930 (85 y.o. M) Treating RN: Harold Barban Primary Care Elizar Alpern: Lelon Huh Other Clinician: Referring Chamille Werntz: Referral, Self Treating Temika Sutphin/Extender: STONE III, HOYT Weeks in Treatment: 0 Abuse/Suicide Risk Screen Items Answer ABUSE/SUICIDE RISK SCREEN: Has anyone close to you tried to hurt or harm you recentlyo No Do you feel uncomfortable with anyone in your familyo No Has anyone forced you do things that you didnot want to doo No Do you have any thoughts of harming yourselfo No Electronic Signature(s) Signed: 06/21/2018 9:36:27 AM By: Harold Barban Entered By: Harold Barban on 06/15/2018 08:50:08 Miguel Torres ([id number]) -------------------------------------------------------------------------------- Activities of Daily Living Details Patient Name: Miguel Torres Date of Service: 06/15/2018 8:45 AM Medical Record Number: [id number] Patient Account Number: [id number] Date of Birth/Sex: 1931-02-03 (85 y.o. M) Treating RN: Harold Barban Primary Care Glen Kesinger: Lelon Huh Other Clinician: Referring Garielle Mroz: Referral, Self Treating Blenda Wisecup/Extender: Melburn Hake, HOYT Weeks in Treatment: 0 Activities of Daily Living Items Answer Activities of Daily Living (Please select one for each item) Drive Automobile Not Able Take Medications Completely Able Use Telephone Completely Able Care for Appearance Need Assistance Use Toilet Need Assistance Bath / Shower Need Assistance Dress Self Need Assistance Feed Self Completely Able Walk Need Assistance Get In / Out Bed Need Assistance Housework Not Able Prepare Meals Not Able Handle Money Need Assistance Shop for Self Not Able Electronic  Signature(s) Signed: 06/21/2018 9:36:27 AM By: Harold Barban Entered By: Harold Barban on 06/15/2018 08:50:44 Miguel Torres ([id number]) -------------------------------------------------------------------------------- Education Assessment Details Patient Name: Miguel Torres Date of Service: 06/15/2018 8:45 AM Medical Record Number: [id number] Patient Account Number: [id number] Date of Birth/Sex: Sep 21, 1930 (85 y.o. M) Treating RN: Harold Barban Primary Care Naomii Kreger: Lelon Huh Other Clinician: Referring Donavan Kerlin: Referral, Self Treating Sherylann Vangorden/Extender: Melburn Hake, HOYT Weeks in Treatment: 0 Learning Preferences/Education Level/Primary Language Learning Preference: Explanation Highest Education Level: High School Preferred Language: English Cognitive Barrier Assessment/Beliefs Language Barrier: No Translator Needed: No Memory Deficit: No Emotional Barrier: No Cultural/Religious Beliefs Affecting Medical Care: No Physical Barrier Assessment Impaired Vision: No Impaired Hearing: No Decreased Hand dexterity: No Knowledge/Comprehension Assessment Knowledge Level: High Comprehension Level: High Ability to understand written High instructions: Ability to understand verbal High instructions: Motivation Assessment Anxiety Level: Calm Cooperation: Cooperative Education Importance: Acknowledges Need Interest in Health Problems: Asks Questions Perception: Coherent Willingness to Engage in Self- High Management Activities: Readiness to Engage in Self- High Management Activities: Electronic Signature(s) Signed: 06/21/2018 9:36:27 AM By: Harold Barban Entered By: Harold Barban on 06/15/2018 08:51:05 Miguel Torres ([id number]) -------------------------------------------------------------------------------- Fall Risk Assessment Details Patient Name: Miguel Torres Date of Service: 06/15/2018 8:45 AM Medical Record Number: [id number] Patient  Account Number: [id number] Date of Birth/Sex: 18-Apr-1931 (85 y.o. M) Treating RN: Harold Barban Primary Care Zuma Hust: Lelon Huh Other Clinician: Referring Deyna Carbon: Referral, Self Treating Adolphe Fortunato/Extender: Melburn Hake, HOYT Weeks in Treatment: 0 Fall Risk Assessment Items Have you had 2 or more falls in the last 12 monthso 0 No Have you had any fall that resulted in injury in the last 12 monthso 0 No FALL RISK ASSESSMENT: History of falling - immediate or within 3 months 0 No Secondary diagnosis 0 No Ambulatory aid None/bed rest/wheelchair/nurse 0 No Crutches/cane/walker 0 No Furniture 0 No IV Access/Saline Lock 0 No Gait/Training Normal/bed rest/immobile 0 No Weak 0 No Impaired  0 No Mental Status Oriented to own ability 0 No Electronic Signature(s) Signed: 06/21/2018 9:36:27 AM By: Harold Barban Entered By: Harold Barban on 06/15/2018 08:51:18 Miguel Torres ([id number]) -------------------------------------------------------------------------------- Foot Assessment Details Patient Name: Miguel Torres Date of Service: 06/15/2018 8:45 AM Medical Record Number: [id number] Patient Account Number: [id number] Date of Birth/Sex: [birth year] (85 y.o. M) Treating RN: Harold Barban Primary Care Keri Tavella: Lelon Huh Other Clinician: Referring Dare Spillman: Referral, Self Treating Rhya Shan/Extender: STONE III, HOYT Weeks in Treatment: 0 Foot Assessment Items Site Locations + = Sensation present, - = Sensation absent, C = Callus, U = Ulcer R = Redness, W = Warmth, M = Maceration, PU = Pre-ulcerative lesion F = Fissure, S = Swelling, D = Dryness Assessment Right: Left: Other Deformity: No No Prior Foot Ulcer: No No Prior Amputation: No No Charcot Joint: No No Ambulatory Status: Gait: Notes BKA Right Electronic Signature(s) Signed: 06/21/2018 9:36:27 AM By: Harold Barban Entered By: Harold Barban on 06/15/2018 08:52:58 Miguel Torres  ([id number]) -------------------------------------------------------------------------------- Nutrition Risk Assessment Details Patient Name: Miguel Torres Date of Service: 06/15/2018 8:45 AM Medical Record Number: [id number] Patient Account Number: [id number] Date of Birth/Sex: 1930/07/26 (85 y.o. M) Treating RN: Harold Barban Primary Care Lateisha Thurlow: Lelon Huh Other Clinician: Referring Zailey Audia: Referral, Self Treating Siyon Linck/Extender: STONE III, HOYT Weeks in Treatment: 0 Height (in): 68 Weight (lbs): 218 Body Mass Index (BMI): 33.1 Nutrition Risk Assessment Items NUTRITION RISK SCREEN: I have an illness or condition that made me change the kind and/or amount of 0 No food I eat I eat fewer than two meals per day 0 No I eat few fruits and vegetables, or milk products 0 No I have three or more drinks of beer, liquor or wine almost every day 0 No I have tooth or mouth problems that make it hard for me to eat 0 No I don't always have enough money to buy the food I need 0 No I eat alone most of the time 0 No I take three or more different prescribed or over-the-counter drugs a day 1 Yes Without wanting to, I have lost or gained 10 pounds in the last six months 0 No I am not always physically able to shop, cook and/or feed myself 0 No Nutrition Protocols Good Risk Protocol Moderate Risk Protocol Electronic Signature(s) Signed: 06/21/2018 9:36:27 AM By: Harold Barban Entered By: Harold Barban on 06/15/2018 08:51:38

## 2018-06-21 NOTE — Progress Notes (Addendum)
KAMAU, WEATHERALL ([id number]) Visit Report for 06/15/2018 Allergy List Details Patient Name: Miguel Torres, Miguel Torres Date of Service: 06/15/2018 8:45 AM Medical Record Number: [id number] Patient Account Number: [id number] Date of Birth/Sex: [birth year] (85 y.o. M) Treating RN: Harold Barban Primary Care Anselmo Reihl: Lelon Huh Other Clinician: Referring Kaislyn Gulas: Referral, Self Treating Oral Hallgren/Extender: STONE III, HOYT Weeks in Treatment: 0 Allergies Active Allergies No Known Drug Allergies Allergy Notes Electronic Signature(s) Signed: 06/21/2018 9:36:27 AM By: Harold Barban Entered By: Harold Barban on 06/15/2018 08:43:09 Miguel Torres ([id number]) -------------------------------------------------------------------------------- Arrival Information Details Patient Name: Miguel Torres Date of Service: 06/15/2018 8:45 AM Medical Record Number: [id number] Patient Account Number: [id number] Date of Birth/Sex: 10/25/1930 (85 y.o. M) Treating RN: Montey Hora Primary Care Soul Hackman: Lelon Huh Other Clinician: Referring Joscelyn Hardrick: Referral, Self Treating Efrata Brunner/Extender: Melburn Hake, HOYT Weeks in Treatment: 0 Visit Information Patient Arrived: Wheel Chair Arrival Time: 08:37 Accompanied By: daughter [name] Transfer Assistance: Manual Patient Identification Verified: Yes Secondary Verification Process Yes Completed: Patient Has Alerts: Yes Patient Alerts: 01/19/2018 ABI L 0.81 AVVS History Since Last Visit Added or deleted any medications: No Any new allergies or adverse reactions: No Had a fall or experienced change in activities of daily living that may affect risk of falls: No Signs or symptoms of abuse/neglect since last visito No Hospitalized since last visit: No Implantable device outside of the clinic excluding cellular tissue based products placed in the center since last visit: No Has Dressing in Place as Prescribed: No Pain Present Now:  No Electronic Signature(s) Signed: 06/15/2018 9:26:33 AM By: Harold Barban Entered By: Harold Barban on 06/15/2018 09:26:32 Miguel Torres ([id number]) -------------------------------------------------------------------------------- Encounter Discharge Information Details Patient Name: Miguel Torres Date of Service: 06/15/2018 8:45 AM Medical Record Number: [id number] Patient Account Number: [id number] Date of Birth/Sex: 06-09-1930 (85 y.o. M) Treating RN: Harold Barban Primary Care Zayna Toste: Lelon Huh Other Clinician: Referring Josefine Fuhr: Referral, Self Treating Angellina Ferdinand/Extender: Melburn Hake, HOYT Weeks in Treatment: 0 Encounter Discharge Information Items Post Procedure Vitals Discharge Condition: Stable Temperature (F): 98.2 Ambulatory Status: Wheelchair Pulse (bpm): 84 Discharge Destination: Home Respiratory Rate (breaths/min): 18 Transportation: Private Auto Blood Pressure (mmHg): 182/95 Accompanied By: daughter Schedule Follow-up Appointment: Yes Clinical Summary of Care: Electronic Signature(s) Signed: 06/21/2018 9:36:27 AM By: Harold Barban Entered By: Harold Barban on 06/15/2018 09:51:47 Miguel Torres ([id number]) -------------------------------------------------------------------------------- Lower Extremity Assessment Details Patient Name: Miguel Torres Date of Service: 06/15/2018 8:45 AM Medical Record Number: [id number] Patient Account Number: [id number] Date of Birth/Sex: 11/10/1930 (85 y.o. M) Treating RN: Harold Barban Primary Care [place]: Lelon Huh Other Clinician: Referring Elnita Surprenant: Referral, Self Treating Braylinn Gulden/Extender: Melburn Hake, HOYT Weeks in Treatment: 0 Edema Assessment Assessed: [Left: No] [Right: No] Edema: [Left: Ye] [Right: s] Calf Left: Right: Point of Measurement: 32 cm From Medial Instep 42.5 cm cm Ankle Left: Right: Point of Measurement: 12 cm From Medial Instep 26 cm cm Vascular  Assessment Pulses: Dorsalis Pedis Palpable: [Left:No] Posterior Tibial Palpable: [Left:No] Extremity colors, hair growth, and conditions: Extremity Color: [Left:Red] Hair Growth on Extremity: [Left:No] Temperature of Extremity: [Left:Cool] Capillary Refill: [Left:< 3 seconds] Toe Nail Assessment Left: Right: Thick: Yes Discolored: Yes Deformed: Yes Improper Length and Hygiene: No Notes Unable to do ABI right due to BKA Electronic Signature(s) Signed: 06/15/2018 9:27:32 AM By: Harold Barban Entered By: Harold Barban on 06/15/2018 09:27:31 Miguel Torres ([id number]) -------------------------------------------------------------------------------- Multi Wound Chart Details Patient Name: Miguel Torres Date of Service: 06/15/2018 8:45 AM Medical Record Number: [id number] Patient Account Number: [id number] Date of  Birth/Sex: 04-Feb-1931 (85 y.o. M) Treating RN: Montey Hora Primary Care Caston Coopersmith: Lelon Huh Other Clinician: Referring Nolene Rocks: Referral, Self Treating Deryl Giroux/Extender: STONE III, HOYT Weeks in Treatment: 0 Vital Signs Height(in): 68 Pulse(bpm): 84 Weight(lbs): 218 Blood Pressure(mmHg): 182/95 Body Mass Index(BMI): 33 Temperature(F): 98.2 Respiratory Rate 18 (breaths/min): Photos: [2:No Photos] [3:No Photos] [4:No Photos] Wound Location: [2:Left Lower Leg - Proximal] [3:Lower Leg - Anterior, Distal] [4:Left Foot - Lateral] Wounding Event: [2:Gradually Appeared] [3:Gradually Appeared] [4:Gradually Appeared] Primary Etiology: [2:Diabetic Wound/Ulcer of the Lower Extremity] [3:Diabetic Wound/Ulcer of the Lower Extremity] [4:Diabetic Wound/Ulcer of the Lower Extremity] Comorbid History: [2:Glaucoma, Congestive Heart Failure, Hypertension, Type II Diabetes, Gout, Osteoarthritis, Neuropathy] [3:Glaucoma, Congestive Heart Failure, Hypertension, Type II Diabetes, Gout, Osteoarthritis, Neuropathy] [4:Glaucoma, Congestive  Heart Failure,  Hypertension, Type II Diabetes, Gout, Osteoarthritis, Neuropathy] Date Acquired: [2:06/11/2018] [3:06/11/2018] [4:06/05/2018] Weeks of Treatment: [2:0] [3:0] [4:0] Wound Status: [2:Open] [3:Open] [4:Open] Measurements L x W x D [2:7x5x0.1] [3:10x7x0.1] [4:4x1x0.1] (cm) Area (cm) : [2:27.489] [3:54.978] [4:3.142] Volume (cm) : [2:2.749] [3:5.498] [4:0.314] Classification: [2:Grade 2] [3:Grade 2] [4:Grade 2] Exudate Amount: [2:Large] [3:Large] [4:Large] Exudate Type: [2:Serous] [3:Serous] [4:Serous] Exudate Color: [2:amber] [3:amber] [4:amber] Wound Margin: [2:Flat and Intact] [3:Flat and Intact] [4:Flat and Intact] Granulation Amount: [2:None Present (0%)] [3:None Present (0%)] [4:None Present (0%)] Necrotic Amount: [2:Large (67-100%)] [3:Large (67-100%)] [4:Large (67-100%)] Exposed Structures: [2:Fat Layer (Subcutaneous Tissue) Exposed: Yes Fascia: No Tendon: No Muscle: No Joint: No Bone: No] [3:Fat Layer (Subcutaneous Tissue) Exposed: Yes Fascia: No Tendon: No Muscle: No Joint: No Bone: No] [4:Fat Layer (Subcutaneous Tissue) Exposed: Yes  Fascia: No Tendon: No Muscle: No Joint: No Bone: No] Epithelialization: [2:None] [3:None] [4:None] Periwound Skin Texture: [2:Excoriation: No Induration: No Callus: No Crepitus: No Rash: No Scarring: No] [3:Excoriation: No Induration: No Callus: No Crepitus: No Rash: No Scarring: No] [4:No Abnormalities Noted] Periwound Skin Moisture: Dry/Scaly: Yes Dry/Scaly: Yes Maceration: Yes Maceration: No Maceration: No Dry/Scaly: Yes Periwound Skin Color: Atrophie Blanche: No Atrophie Blanche: No No Abnormalities Noted Cyanosis: No Cyanosis: No Ecchymosis: No Ecchymosis: No Erythema: No Erythema: No Hemosiderin Staining: No Hemosiderin Staining: No Mottled: No Mottled: No Pallor: No Pallor: No Rubor: No Rubor: No Tenderness on Palpation: No No Yes Wound Preparation: Ulcer Cleansing: Wound Ulcer Cleansing: Wound Ulcer Cleansing: Wound Cleanser  Cleanser Cleanser Topical Anesthetic Applied: Topical Anesthetic Applied: Topical Anesthetic Applied: Other: lidocaine 4% Other: lidocaine 4% Other: lidocaine 4% Wound Number: 5 N/A N/A Photos: No Photos N/A N/A Wound Location: Left Lower Leg - Posterior N/A N/A Wounding Event: Gradually Appeared N/A N/A Primary Etiology: Diabetic Wound/Ulcer of the N/A N/A Lower Extremity Comorbid History: Glaucoma, Congestive Heart N/A N/A Failure, Hypertension, Type II Diabetes, Gout, Osteoarthritis, Neuropathy Date Acquired: 06/11/2018 N/A N/A Weeks of Treatment: 0 N/A N/A Wound Status: Open N/A N/A Measurements L x W x D 2x5x0.1 N/A N/A (cm) Area (cm) : 7.854 N/A N/A Volume (cm) : 0.785 N/A N/A Classification: Grade 2 N/A N/A Exudate Amount: Large N/A N/A Exudate Type: Serous N/A N/A Exudate Color: amber N/A N/A Wound Margin: Flat and Intact N/A N/A Granulation Amount: None Present (0%) N/A N/A Necrotic Amount: Large (67-100%) N/A N/A Exposed Structures: Fat Layer (Subcutaneous N/A N/A Tissue) Exposed: Yes Fascia: No Tendon: No Muscle: No Joint: No Bone: No Epithelialization: None N/A N/A Periwound Skin Texture: Excoriation: No N/A N/A Induration: No Callus: No Crepitus: No Rash: No Scarring: No Periwound Skin Moisture: Dry/Scaly: Yes N/A N/A Maceration: No Periwound Skin Color: N/A N/A RAHUL, MALINAK ([id number]) Atrophie Blanche: No Cyanosis:  No Ecchymosis: No Erythema: No Hemosiderin Staining: No Mottled: No Pallor: No Rubor: No Tenderness on Palpation: No N/A N/A Wound Preparation: Ulcer Cleansing: Wound N/A N/A Cleanser Topical Anesthetic Applied: Other: lidocaine 4% Treatment Notes Electronic Signature(s) Signed: 06/15/2018 4:52:44 PM By: Montey Hora Entered By: Montey Hora on 06/15/2018 09:28:43 Miguel Torres ([id number]) -------------------------------------------------------------------------------- Post Falls  Details Patient Name: Miguel Torres Date of Service: 06/15/2018 8:45 AM Medical Record Number: [id number] Patient Account Number: [id number] Date of Birth/Sex: 04/23/1931 (85 y.o. M) Treating RN: Montey Hora Primary Care Kemba Hoppes: Lelon Huh Other Clinician: Referring Shaeleigh Graw: Referral, Self Treating Olman Yono/Extender: Melburn Hake, HOYT Weeks in Treatment: 0 Active Inactive Abuse / Safety / Falls / Self Care Management Nursing Diagnoses: Potential for falls Goals: Patient will not experience any injury related to falls Date Initiated: 06/15/2018 Target Resolution Date: 09/11/2018 Goal Status: Active Interventions: Assess fall risk on admission and as needed Notes: Nutrition Nursing Diagnoses: Potential for alteratiion in Nutrition/Potential for imbalanced nutrition Goals: Patient/caregiver agrees to and verbalizes understanding of need to use nutritional supplements and/or vitamins as prescribed Date Initiated: 06/15/2018 Target Resolution Date: 09/11/2018 Goal Status: Active Interventions: Assess patient nutrition upon admission and as needed per policy Notes: Venous Leg Ulcer Nursing Diagnoses: Potential for venous Insuffiency (use before diagnosis confirmed) Goals: Patient will maintain optimal edema control Date Initiated: 06/15/2018 Target Resolution Date: 09/11/2018 Goal Status: Active Interventions: Compression as ordered Miguel Torres ([id number]) Notes: Wound/Skin Impairment Nursing Diagnoses: Impaired tissue integrity Goals: Ulcer/skin breakdown will heal within 14 weeks Date Initiated: 06/15/2018 Target Resolution Date: 09/11/2018 Goal Status: Active Interventions: Assess patient/caregiver ability to obtain necessary supplies Assess patient/caregiver ability to perform ulcer/skin care regimen upon admission and as needed Assess ulceration(s) every visit Notes: Electronic Signature(s) Signed: 06/15/2018 4:52:44 PM By: Montey Hora Entered By:  Montey Hora on 06/15/2018 17:61:60 Miguel Torres ([id number]) -------------------------------------------------------------------------------- Pain Assessment Details Patient Name: Miguel Torres Date of Service: 06/15/2018 8:45 AM Medical Record Number: [id number] Patient Account Number: [id number] Date of Birth/Sex: May 12, 1930 (85 y.o. M) Treating RN: Montey Hora Primary Care Barbarann Kelly: Lelon Huh Other Clinician: Referring Sai Zinn: Referral, Self Treating Enma Maeda/Extender: Melburn Hake, HOYT Weeks in Treatment: 0 Active Problems Location of Pain Severity and Description of Pain Patient Has Paino No Site Locations Pain Management and Medication Current Pain Management: Electronic Signature(s) Signed: 06/15/2018 3:52:51 PM By: Paulla Fore, RRT, CHT Signed: 06/15/2018 4:52:44 PM By: Montey Hora Entered By: Lorine Bears on 06/15/2018 08:37:58 Miguel Torres ([id number]) -------------------------------------------------------------------------------- Patient/Caregiver Education Details Patient Name: Miguel Torres Date of Service: 06/15/2018 8:45 AM Medical Record Number: [id number] Patient Account Number: [id number] Date of Birth/Gender: [birth year] (85 y.o. M) Treating RN: Harold Barban Primary Care Physician: Lelon Huh Other Clinician: Referring Physician: Referral, Self Treating Physician/Extender: Sharalyn Ink in Treatment: 0 Education Assessment Education Provided To: Patient Education Topics Provided Wound/Skin Impairment: Handouts: Caring for Your Ulcer Methods: Demonstration, Explain/Verbal Responses: State content correctly Electronic Signature(s) Signed: 06/21/2018 9:36:27 AM By: Harold Barban Entered By: Harold Barban on 06/15/2018 09:51:57 Miguel Torres ([id number]) -------------------------------------------------------------------------------- Wound Assessment  Details Patient Name: Miguel Torres Date of Service: 06/15/2018 8:45 AM Medical Record Number: [id number] Patient Account Number: [id number] Date of Birth/Sex: June 02, 1930 (85 y.o. M) Treating RN: Harold Barban Primary Care Declin Rajan: Lelon Huh Other Clinician: Referring Genesys Coggeshall: Referral, Self Treating Maxfield Gildersleeve/Extender: STONE III, HOYT Weeks in Treatment: 0 Wound Status Wound Number: 2 Primary Diabetic Wound/Ulcer of the Lower Extremity Etiology: Wound Location: Left Lower Leg - Proximal  Wound Open Wounding Event: Gradually Appeared Status: Date Acquired: 06/11/2018 Comorbid Glaucoma, Congestive Heart Failure, Weeks Of Treatment: 0 History: Hypertension, Type II Diabetes, Gout, Clustered Wound: No Osteoarthritis, Neuropathy Photos Photo Uploaded By: Harold Barban on 06/15/2018 09:32:46 Wound Measurements Length: (cm) 7 Width: (cm) 5 Depth: (cm) 0.1 Area: (cm) 27.489 Volume: (cm) 2.749 % Reduction in Area: % Reduction in Volume: Epithelialization: None Tunneling: No Undermining: No Wound Description Classification: Grade 2 Wound Margin: Flat and Intact Exudate Amount: Large Exudate Type: Serous Exudate Color: amber Foul Odor After Cleansing: No Slough/Fibrino Yes Wound Bed Granulation Amount: None Present (0%) Exposed Structure Necrotic Amount: Large (67-100%) Fascia Exposed: No Necrotic Quality: Adherent Slough Fat Layer (Subcutaneous Tissue) Exposed: Yes Tendon Exposed: No Muscle Exposed: No Joint Exposed: No Bone Exposed: No Periwound Skin Texture ROSHAWN, AYALA. ([id number]) Texture Color No Abnormalities Noted: No No Abnormalities Noted: No Callus: No Atrophie Blanche: No Crepitus: No Cyanosis: No Excoriation: No Ecchymosis: No Induration: No Erythema: No Rash: No Hemosiderin Staining: No Scarring: No Mottled: No Pallor: No Moisture Rubor: No No Abnormalities Noted: No Dry / Scaly: Yes Maceration: No Wound  Preparation Ulcer Cleansing: Wound Cleanser Topical Anesthetic Applied: Other: lidocaine 4%, Electronic Signature(s) Signed: 06/21/2018 9:36:27 AM By: Harold Barban Entered By: Harold Barban on 06/15/2018 09:04:15 Miguel Torres ([id number]) -------------------------------------------------------------------------------- Wound Assessment Details Patient Name: Miguel Torres Date of Service: 06/15/2018 8:45 AM Medical Record Number: [id number] Patient Account Number: [id number] Date of Birth/Sex: 1931-05-03 (85 y.o. M) Treating RN: Harold Barban Primary Care Zophia Marrone: Lelon Huh Other Clinician: Referring Edgar Reisz: Referral, Self Treating Chrysa Rampy/Extender: STONE III, HOYT Weeks in Treatment: 0 Wound Status Wound Number: 3 Primary Diabetic Wound/Ulcer of the Lower Extremity Etiology: Wound Location: Left Lower Leg - Anterior, Distal Wound Open Wounding Event: Gradually Appeared Status: Date Acquired: 06/11/2018 Comorbid Glaucoma, Congestive Heart Failure, Weeks Of Treatment: 0 History: Hypertension, Type II Diabetes, Gout, Clustered Wound: No Osteoarthritis, Neuropathy Photos Wound Measurements Length: (cm) 10 Width: (cm) 7 Depth: (cm) 0.1 Area: (cm) 54.978 Volume: (cm) 5.498 % Reduction in Area: 0% % Reduction in Volume: 0% Epithelialization: None Tunneling: No Undermining: No Wound Description Classification: Grade 2 Foul Odo Wound Margin: Flat and Intact Slough/F Exudate Amount: Large Exudate Type: Serous Exudate Color: amber r After Cleansing: No ibrino Yes Wound Bed Granulation Amount: None Present (0%) Exposed Structure Necrotic Amount: Large (67-100%) Fascia Exposed: No Necrotic Quality: Adherent Slough Fat Layer (Subcutaneous Tissue) Exposed: Yes Tendon Exposed: No Muscle Exposed: No Joint Exposed: No Bone Exposed: No Periwound Skin Texture Texture Color MOHAMEDAMIN, NIFONG ([id number]) No Abnormalities Noted: No No  Abnormalities Noted: No Callus: No Atrophie Blanche: No Crepitus: No Cyanosis: No Excoriation: No Ecchymosis: No Induration: No Erythema: No Rash: No Hemosiderin Staining: No Scarring: No Mottled: No Pallor: No Moisture Rubor: No No Abnormalities Noted: No Dry / Scaly: Yes Maceration: No Wound Preparation Ulcer Cleansing: Wound Cleanser Topical Anesthetic Applied: Other: lidocaine 4%, Electronic Signature(s) Signed: 06/22/2018 10:10:33 AM By: Harold Barban Previous Signature: 06/21/2018 9:36:27 AM Version By: Harold Barban Entered By: Harold Barban on 06/22/2018 10:10:32 Miguel Torres ([id number]) -------------------------------------------------------------------------------- Wound Assessment Details Patient Name: Miguel Torres Date of Service: 06/15/2018 8:45 AM Medical Record Number: [id number] Patient Account Number: [id number] Date of Birth/Sex: June 20, 1930 (85 y.o. M) Treating RN: Harold Barban Primary Care Daimen Shovlin: Lelon Huh Other Clinician: Referring Rowe Warman: Referral, Self Treating Cletis Clack/Extender: STONE III, HOYT Weeks in Treatment: 0 Wound Status Wound Number: 4 Primary Diabetic Wound/Ulcer of the Lower Extremity Etiology: Wound Location: Left  Foot - Lateral Wound Open Wounding Event: Gradually Appeared Status: Date Acquired: 06/05/2018 Comorbid Glaucoma, Congestive Heart Failure, Weeks Of Treatment: 0 History: Hypertension, Type II Diabetes, Gout, Clustered Wound: No Osteoarthritis, Neuropathy Photos Photo Uploaded By: Harold Barban on 06/15/2018 09:33:09 Wound Measurements Length: (cm) 4 Width: (cm) 1 Depth: (cm) 0.1 Area: (cm) 3.142 Volume: (cm) 0.314 % Reduction in Area: % Reduction in Volume: Epithelialization: None Tunneling: No Undermining: No Wound Description Classification: Grade 2 Wound Margin: Flat and Intact Exudate Amount: Large Exudate Type: Serous Exudate Color: amber Foul Odor After  Cleansing: No Slough/Fibrino Yes Wound Bed Granulation Amount: None Present (0%) Exposed Structure Necrotic Amount: Large (67-100%) Fascia Exposed: No Necrotic Quality: Adherent Slough Fat Layer (Subcutaneous Tissue) Exposed: Yes Tendon Exposed: No Muscle Exposed: No Joint Exposed: No Bone Exposed: No Periwound Skin Texture OAKLAN, PERSONS. ([id number]) Texture Color No Abnormalities Noted: No No Abnormalities Noted: No Moisture Temperature / Pain No Abnormalities Noted: No Tenderness on Palpation: Yes Dry / Scaly: Yes Maceration: Yes Wound Preparation Ulcer Cleansing: Wound Cleanser Topical Anesthetic Applied: Other: lidocaine 4%, Electronic Signature(s) Signed: 06/21/2018 9:36:27 AM By: Harold Barban Entered By: Harold Barban on 06/15/2018 09:09:21 Miguel Torres ([id number]) -------------------------------------------------------------------------------- Wound Assessment Details Patient Name: Miguel Torres Date of Service: 06/15/2018 8:45 AM Medical Record Number: [id number] Patient Account Number: [id number] Date of Birth/Sex: 29-Jun-1930 (85 y.o. M) Treating RN: Harold Barban Primary Care Mykalah Saari: Lelon Huh Other Clinician: Referring Inette Doubrava: Referral, Self Treating  Grosser/Extender: STONE III, HOYT Weeks in Treatment: 0 Wound Status Wound Number: 5 Primary Diabetic Wound/Ulcer of the Lower Extremity Etiology: Wound Location: Left Lower Leg - Posterior Wound Open Wounding Event: Gradually Appeared Status: Date Acquired: 06/11/2018 Comorbid Glaucoma, Congestive Heart Failure, Weeks Of Treatment: 0 History: Hypertension, Type II Diabetes, Gout, Clustered Wound: No Osteoarthritis, Neuropathy Photos Photo Uploaded By: Harold Barban on 06/15/2018 09:33:10 Wound Measurements Length: (cm) 2 Width: (cm) 5 Depth: (cm) 0.1 Area: (cm) 7.854 Volume: (cm) 0.785 % Reduction in Area: % Reduction in Volume: Epithelialization:  None Tunneling: No Undermining: No Wound Description Classification: Grade 2 Wound Margin: Flat and Intact Exudate Amount: Large Exudate Type: Serous Exudate Color: amber Foul Odor After Cleansing: No Slough/Fibrino Yes Wound Bed Granulation Amount: None Present (0%) Exposed Structure Necrotic Amount: Large (67-100%) Fascia Exposed: No Necrotic Quality: Adherent Slough Fat Layer (Subcutaneous Tissue) Exposed: Yes Tendon Exposed: No Muscle Exposed: No Joint Exposed: No Bone Exposed: No Periwound Skin Texture YOBANI, SCHERTZER. ([id number]) Texture Color No Abnormalities Noted: No No Abnormalities Noted: No Callus: No Atrophie Blanche: No Crepitus: No Cyanosis: No Excoriation: No Ecchymosis: No Induration: No Erythema: No Rash: No Hemosiderin Staining: No Scarring: No Mottled: No Pallor: No Moisture Rubor: No No Abnormalities Noted: No Dry / Scaly: Yes Maceration: No Wound Preparation Ulcer Cleansing: Wound Cleanser Topical Anesthetic Applied: Other: lidocaine 4%, Electronic Signature(s) Signed: 06/21/2018 9:36:27 AM By: Harold Barban Entered By: Harold Barban on 06/15/2018 09:11:28 Miguel Torres ([id number]) -------------------------------------------------------------------------------- Vitals Details Patient Name: Miguel Torres Date of Service: 06/15/2018 8:45 AM Medical Record Number: [id number] Patient Account Number: [id number] Date of Birth/Sex: [birth year] (85 y.o. M) Treating RN: Montey Hora Primary Care Cormac Wint: Lelon Huh Other Clinician: Referring Dajsha Massaro: Referral, Self Treating Benji Poynter/Extender: STONE III, HOYT Weeks in Treatment: 0 Vital Signs Time Taken: 08:38 Temperature (F): 98.2 Height (in): 68 Pulse (bpm): 84 Source: Stated Respiratory Rate (breaths/min): 18 Weight (lbs): 218 Blood Pressure (mmHg): 182/95 Source: Stated Reference Range: 80 - 120 mg / dl Body Mass Index (BMI): 33.1 Airway  Electronic  Signature(s) Signed: 06/15/2018 3:52:51 PM By: Lorine Bears RCP, RRT, CHT Entered By: Lorine Bears on 06/15/2018 08:40:57

## 2018-06-25 ENCOUNTER — Ambulatory Visit: Payer: Medicare Other | Admitting: Physician Assistant

## 2018-06-26 DIAGNOSIS — N319 Neuromuscular dysfunction of bladder, unspecified: Secondary | ICD-10-CM | POA: Diagnosis not present

## 2018-06-26 DIAGNOSIS — E1122 Type 2 diabetes mellitus with diabetic chronic kidney disease: Secondary | ICD-10-CM | POA: Diagnosis not present

## 2018-06-26 DIAGNOSIS — N401 Enlarged prostate with lower urinary tract symptoms: Secondary | ICD-10-CM | POA: Diagnosis not present

## 2018-06-30 DIAGNOSIS — E1122 Type 2 diabetes mellitus with diabetic chronic kidney disease: Secondary | ICD-10-CM | POA: Diagnosis not present

## 2018-06-30 DIAGNOSIS — N319 Neuromuscular dysfunction of bladder, unspecified: Secondary | ICD-10-CM | POA: Diagnosis not present

## 2018-06-30 DIAGNOSIS — N401 Enlarged prostate with lower urinary tract symptoms: Secondary | ICD-10-CM | POA: Diagnosis not present

## 2018-07-02 ENCOUNTER — Encounter: Payer: Medicare Other | Admitting: Physician Assistant

## 2018-07-02 DIAGNOSIS — L97822 Non-pressure chronic ulcer of other part of left lower leg with fat layer exposed: Secondary | ICD-10-CM | POA: Diagnosis not present

## 2018-07-02 DIAGNOSIS — I7389 Other specified peripheral vascular diseases: Secondary | ICD-10-CM | POA: Diagnosis not present

## 2018-07-02 DIAGNOSIS — I890 Lymphedema, not elsewhere classified: Secondary | ICD-10-CM | POA: Diagnosis not present

## 2018-07-02 DIAGNOSIS — E11621 Type 2 diabetes mellitus with foot ulcer: Secondary | ICD-10-CM | POA: Diagnosis not present

## 2018-07-02 DIAGNOSIS — L97522 Non-pressure chronic ulcer of other part of left foot with fat layer exposed: Secondary | ICD-10-CM | POA: Diagnosis not present

## 2018-07-02 DIAGNOSIS — E11622 Type 2 diabetes mellitus with other skin ulcer: Secondary | ICD-10-CM | POA: Diagnosis not present

## 2018-07-06 NOTE — Progress Notes (Signed)
Miguel Torres ([id number]) Visit Report for 07/02/2018 Chief Complaint Document Details Patient Name: Miguel Torres Date of Service: 07/02/2018 8:30 AM Medical Record Number: [id number] Patient Account Number: [id number] Date of Birth/Sex: 1930-07-23 (85 y.o. M) Treating RN: Cornell Barman Primary Care Provider: Lelon Huh Other Clinician: Referring Provider: Lelon Huh Treating Provider/Extender: Melburn Hake, Monigue Spraggins Weeks in Treatment: 2 Information Obtained from: Patient Chief Complaint Left LE ulcers Electronic Signature(s) Signed: 07/02/2018 5:19:05 PM By: Worthy Keeler PA-C Entered By: Worthy Keeler on 07/02/2018 08:54:57 Miguel Torres ([id number]) -------------------------------------------------------------------------------- Debridement Details Patient Name: Miguel Torres Date of Service: 07/02/2018 8:30 AM Medical Record Number: [id number] Patient Account Number: [id number] Date of Birth/Sex: Sep 14, 1930 (85 y.o. M) Treating RN: Cornell Barman Primary Care Provider: Lelon Huh Other Clinician: Referring Provider: Lelon Huh Treating Provider/Extender: Melburn Hake, Anandi Abramo Weeks in Treatment: 2 Debridement Performed for Wound #4 Left,Lateral Foot Assessment: Performed By: Physician STONE III, Brick Ketcher E., PA-C Debridement Type: Debridement Severity of Tissue Pre Fat layer exposed Debridement: Level of Consciousness (Pre- Awake and Alert procedure): Pre-procedure Verification/Time Yes - 09:00 Out Taken: Start Time: 09:00 Pain Control: Lidocaine Total Area Debrided (L x W): 3 (cm) x 1 (cm) = 3 (cm) Tissue and other material Viable, Non-Viable, Slough, Subcutaneous, Skin: Dermis , Slough debrided: Level: Skin/Subcutaneous Tissue Debridement Description: Excisional Instrument: Curette Bleeding: Minimum Hemostasis Achieved: Pressure End Time: 09:05 Response to Treatment: Procedure was tolerated well Level of Consciousness Awake and  Alert (Post-procedure): Post Debridement Measurements of Total Wound Length: (cm) 3 Width: (cm) 1 Depth: (cm) 0.2 Volume: (cm) 0.471 Character of Wound/Ulcer Post Debridement: Stable Severity of Tissue Post Debridement: Fat layer exposed Post Procedure Diagnosis Same as Pre-procedure Electronic Signature(s) Signed: 07/02/2018 5:19:05 PM By: Worthy Keeler PA-C Signed: 07/05/2018 4:00:49 PM By: Gretta Cool, BSN, RN, CWS, Kim RN, BSN Entered By: Gretta Cool, BSN, RN, CWS, Kim on 07/02/2018 09:03:07 Miguel Torres ([id number]) -------------------------------------------------------------------------------- HPI Details Patient Name: Miguel Torres Date of Service: 07/02/2018 8:30 AM Medical Record Number: [id number] Patient Account Number: [id number] Date of Birth/Sex: 05/12/1930 (85 y.o. M) Treating RN: Cornell Barman Primary Care Provider: Lelon Huh Other Clinician: Referring Provider: Lelon Huh Treating Provider/Extender: Melburn Hake, Daleon Willinger Weeks in Treatment: 2 History of Present Illness Associated Signs and Symptoms: Patient has a history of diabetes mellitus type II, lymphedema, a right below knee amputation which was performed 10 years ago, and proof of vascular disease which is managed by [facility], Dr. dew HPI Description: 01/19/18 on evaluation today patient presents for initial evaluation and clinic due to issues that he has been having with a left lateral foot ulcer which has been present for at minimum two months although this may have actually been longer before the family knew of the wound. Currently he has been seen [facility] where they have performed arterial studies. I did have those for review today which did show that he has mild left lower extremity arterial disease with a abnormal left toe brachial index. His ABI was 0.93 with a TBI of 0.26. Upon further evaluation it appears that the patient actually was recommended to have an  angiogram by Dr. dew. This was due to the fact that there was concerned about the low TBI and the fact that he may not be perfusion all the way down into his foot. With that being said the final decision as to whether or not to proceed with this has not been made as of yet according to the  patient's daughter who was present for evaluation at this time today. The patient has no evidence of infection at this time. With that being said there is bone exposed in the base of the wound unfortunately. This obviously may indicate a more significant issue underlying just the open wound. Other than that he actually appears to have a significant amount of epithelialization noted which is excellent news. No fevers, chills, nausea, or vomiting noted at this time. He has had an x-ray which revealed no evidence of osteomyelitis this was performed July 2019. No MRI has been performed at this point. 01/28/18 on evaluation today patient actually appears to be doing about the same in regard to his left lateral foot ulcer. He does not have as much is the way of necrotic tissue overlying the surface of the wound in fact no sharp debridement was necessary. With that being said he does have some maceration noted. He may benefit from adding alginate over the Prisma along the lateral aspect of his wound. In regard to the Kerlex and Coban wrap it appears that this actually was not wrapped as high as it should've been and therefore the patient felt blisters on the lateral portion of his upper left lower extremity. 02/08/18 on evaluation today patient presents today for follow-up after having had his MRI which was performed on 02/05/18. This MRI showed that there did not appear to be any evidence of osteomyelitis of the left foot or ankle at this time. He did have a prior midfoot arthrodesis with solid osseous fusion. Nonetheless in general the patient seems to be doing excellent at this point in time which is great news. Overall  very pleased with the fact there is no infection and the fact that his wound actually seems to be doing better. 02/19/18 on evaluation today patient appears to be doing excellent in regard to his foot ulcer. He's been tolerating the dressing changes without complication. Fortunately there's no sign of infection and the wound appears to be completely healed. The patient and his daughter are both extremely pleased. Readmission: 06/15/18 on evaluation today patient appears to be doing a little poorly in regard to his left lower extremity where he has significant swelling. He's previously had swelling here as well unfortunately due to the swelling that occurred over the past week he has blisters that arose on his leg and then subsequently the scar along the left lateral foot which was previously closed when he was here in the clinic reopened I believe this is simply due to the fact that it's a weak area that due to the swelling the fluid found this region to get out of. Fortunately there's no signs of infection at this time. No fevers, chills, nausea, or vomiting noted at this time. Patient has no pain at any site which is good news. He has been spending a lot of time in his wheelchair with his feet down I think this is bad for him as well. He also apparently drink a lot of pickle juice specifically just prior to this happening I'm not sure of the salt content but I wonder if this may have had something to do with his increased swelling. He does have congestive heart failure but doesn't have any trouble or difficulty breathing right now this is good news. 07/02/18 on evaluation today patient appears to be doing rather well in regard to his left lower extremity. In fact the foot Miguel Torres, Miguel Torres. ([id number]) appears to have openings still noted but the remainder  them leg actually appears to be completely closed which is great news. Fortunately there's no signs of active infection at this time. He's  been tolerating the dressing changes without complication. Electronic Signature(s) Signed: 07/02/2018 5:19:05 PM By: Worthy Keeler PA-C Entered By: Worthy Keeler on 07/02/2018 09:09:17 Miguel Torres ([id number]) -------------------------------------------------------------------------------- Physical Exam Details Patient Name: Miguel Torres Date of Service: 07/02/2018 8:30 AM Medical Record Number: [id number] Patient Account Number: [id number] Date of Birth/Sex: 04/15/1931 (85 y.o. M) Treating RN: Cornell Barman Primary Care Provider: Lelon Huh Other Clinician: Referring Provider: Lelon Huh Treating Provider/Extender: Melburn Hake, Ryian Lynde Weeks in Treatment: 2 Constitutional Well-nourished and well-hydrated in no acute distress. Respiratory normal breathing without difficulty. Psychiatric this patient is able to make decisions and demonstrates good insight into disease process. Alert and Oriented x 3. pleasant and cooperative. Notes Patient's wound on the foot did require sharp debridement today this was performed without complication post debridement wound bed appears to be doing much better which is excellent. There does not appear to be any infection. No fevers chills noted Electronic Signature(s) Signed: 07/02/2018 5:19:05 PM By: Worthy Keeler PA-C Entered By: Worthy Keeler on 07/02/2018 09:09:42 Miguel Torres ([id number]) -------------------------------------------------------------------------------- Physician Orders Details Patient Name: Miguel Torres Date of Service: 07/02/2018 8:30 AM Medical Record Number: [id number] Patient Account Number: [id number] Date of Birth/Sex: June 25, 1930 (85 y.o. M) Treating RN: Cornell Barman Primary Care Provider: Lelon Huh Other Clinician: Referring Provider: Lelon Huh Treating Provider/Extender: Melburn Hake, Verdella Laidlaw Weeks in Treatment: 2 Verbal / Phone Orders: No Diagnosis Coding ICD-10 Coding Code  Description E11.621 Type 2 diabetes mellitus with foot ulcer I89.0 Lymphedema, not elsewhere classified L97.822 Non-pressure chronic ulcer of other part of left lower leg with fat layer exposed L97.522 Non-pressure chronic ulcer of other part of left foot with fat layer exposed I73.89 Other specified peripheral vascular diseases Z89.511 Acquired absence of right leg below knee Wound Cleansing Wound #4 Left,Lateral Foot o Clean wound with Normal Saline. o May Shower, gently pat wound dry prior to applying new dressing. - may remove wrap and bathe leg in soap and water and then replace wrap Anesthetic (add to Medication List) Wound #4 Left,Lateral Foot o Topical Lidocaine 4% cream applied to wound bed prior to debridement (In Clinic Only). Primary Wound Dressing Wound #4 Left,Lateral Foot o Silver Alginate Secondary Dressing Wound #4 Left,Lateral Foot o ABD pad Dressing Change Frequency Wound #4 Left,Lateral Foot o Three times weekly - SNF to rewrap legs 3 times weekly Follow-up Appointments Wound #4 Left,Lateral Foot o Return Appointment in 1 week. Edema Control Wound #4 Left,Lateral Foot o Kerlix and Coban - Right Lower Extremity - SNF TO WRAP USING [facility] AND [place] LIGHTLY FROM THE TOES TO 3CM BELOW THE KNEE JHALEN, ELEY ([id number]) o 3 Layer Compression System - Right Lower Extremity - THIS WRAP WILL BE USED BY [facility] ONLY Electronic Signature(s) Signed: 07/02/2018 5:19:05 PM By: Worthy Keeler PA-C Signed: 07/05/2018 4:00:49 PM By: Gretta Cool, BSN, RN, CWS, Kim RN, BSN Entered By: Gretta Cool, BSN, RN, CWS, Kim on 07/02/2018 09:03:54 Miguel Torres ([id number]) -------------------------------------------------------------------------------- Problem List Details Patient Name: Miguel Torres Date of Service: 07/02/2018 8:30 AM Medical Record Number: [id number] Patient Account Number: [id number] Date of Birth/Sex: December 13, 1930 (85 y.o.  M) Treating RN: Cornell Barman Primary Care Provider: Lelon Huh Other Clinician: Referring Provider: Lelon Huh Treating Provider/Extender: Melburn Hake, Lundynn Cohoon Weeks in Treatment: 2 Active Problems ICD-10 Evaluated Encounter Code Description Active Date  Today Diagnosis E11.621 Type 2 diabetes mellitus with foot ulcer 06/15/2018 No Yes I89.0 Lymphedema, not elsewhere classified 06/15/2018 No Yes L97.822 Non-pressure chronic ulcer of other part of left lower leg with 06/15/2018 No Yes fat layer exposed L97.522 Non-pressure chronic ulcer of other part of left foot with fat 06/15/2018 No Yes layer exposed I73.89 Other specified peripheral vascular diseases 06/15/2018 No Yes Z89.511 Acquired absence of right leg below knee 06/15/2018 No Yes Inactive Problems Resolved Problems Electronic Signature(s) Signed: 07/02/2018 5:19:05 PM By: Worthy Keeler PA-C Entered By: Worthy Keeler on 07/02/2018 08:54:53 Miguel Torres ([id number]) -------------------------------------------------------------------------------- Progress Note Details Patient Name: Miguel Torres Date of Service: 07/02/2018 8:30 AM Medical Record Number: [id number] Patient Account Number: [id number] Date of Birth/Sex: 1930/08/17 (85 y.o. M) Treating RN: Cornell Barman Primary Care Provider: Lelon Huh Other Clinician: Referring Provider: Lelon Huh Treating Provider/Extender: Melburn Hake, Aadan Chenier Weeks in Treatment: 2 Subjective Chief Complaint Information obtained from Patient Left LE ulcers History of Present Illness (HPI) The following HPI elements were documented for the patient's wound: Associated Signs and Symptoms: Patient has a history of diabetes mellitus type II, lymphedema, a right below knee amputation which was performed 10 years ago, and proof of vascular disease which is managed by [facility], Dr. dew 01/19/18 on evaluation today patient presents for initial evaluation and clinic due  to issues that he has been having with a left lateral foot ulcer which has been present for at minimum two months although this may have actually been longer before the family knew of the wound. Currently he has been seen [facility] where they have performed arterial studies. I did have those for review today which did show that he has mild left lower extremity arterial disease with a abnormal left toe brachial index. His ABI was 0.93 with a TBI of 0.26. Upon further evaluation it appears that the patient actually was recommended to have an angiogram by Dr. dew. This was due to the fact that there was concerned about the low TBI and the fact that he may not be perfusion all the way down into his foot. With that being said the final decision as to whether or not to proceed with this has not been made as of yet according to the patient's daughter who was present for evaluation at this time today. The patient has no evidence of infection at this time. With that being said there is bone exposed in the base of the wound unfortunately. This obviously may indicate a more significant issue underlying just the open wound. Other than that he actually appears to have a significant amount of epithelialization noted which is excellent news. No fevers, chills, nausea, or vomiting noted at this time. He has had an x-ray which revealed no evidence of osteomyelitis this was performed July 2019. No MRI has been performed at this point. 01/28/18 on evaluation today patient actually appears to be doing about the same in regard to his left lateral foot ulcer. He does not have as much is the way of necrotic tissue overlying the surface of the wound in fact no sharp debridement was necessary. With that being said he does have some maceration noted. He may benefit from adding alginate over the Prisma along the lateral aspect of his wound. In regard to the Kerlex and Coban wrap it appears that this actually  was not wrapped as high as it should've been and therefore the patient felt blisters on the lateral  portion of his upper left lower extremity. 02/08/18 on evaluation today patient presents today for follow-up after having had his MRI which was performed on 02/05/18. This MRI showed that there did not appear to be any evidence of osteomyelitis of the left foot or ankle at this time. He did have a prior midfoot arthrodesis with solid osseous fusion. Nonetheless in general the patient seems to be doing excellent at this point in time which is great news. Overall very pleased with the fact there is no infection and the fact that his wound actually seems to be doing better. 02/19/18 on evaluation today patient appears to be doing excellent in regard to his foot ulcer. He's been tolerating the dressing changes without complication. Fortunately there's no sign of infection and the wound appears to be completely healed. The patient and his daughter are both extremely pleased. Readmission: 06/15/18 on evaluation today patient appears to be doing a little poorly in regard to his left lower extremity where he has significant swelling. He's previously had swelling here as well unfortunately due to the swelling that occurred over the past week he has blisters that arose on his leg and then subsequently the scar along the left lateral foot which was previously closed when he was here in the clinic reopened I believe this is simply due to the fact that it's a weak area that due to the [place] ([id number]) swelling the fluid found this region to get out of. Fortunately there's no signs of infection at this time. No fevers, chills, nausea, or vomiting noted at this time. Patient has no pain at any site which is good news. He has been spending a lot of time in his wheelchair with his feet down I think this is bad for him as well. He also apparently drink a lot of pickle juice specifically just prior to  this happening I'm not sure of the salt content but I wonder if this may have had something to do with his increased swelling. He does have congestive heart failure but doesn't have any trouble or difficulty breathing right now this is good news. 07/02/18 on evaluation today patient appears to be doing rather well in regard to his left lower extremity. In fact the foot appears to have openings still noted but the remainder them leg actually appears to be completely closed which is great news. Fortunately there's no signs of active infection at this time. He's been tolerating the dressing changes without complication. Patient History Information obtained from Patient. Family History Diabetes - Mother, Heart Disease - Mother, Hypertension - Mother, No family history of Kidney Disease, Lung Disease, Seizures, Stroke, Thyroid Problems, Tuberculosis. Social History Never smoker, Marital Status - Married, Alcohol Use - Never, Drug Use - No History, Caffeine Use - Daily. Medical History Eyes Patient has history of Glaucoma Denies history of Cataracts, Optic Neuritis Ear/Nose/Mouth/Throat Denies history of Chronic sinus problems/congestion, Middle ear problems Hematologic/Lymphatic Denies history of Anemia, Hemophilia, Human Immunodeficiency Virus, Lymphedema, Sickle Cell Disease Respiratory Denies history of Aspiration, Asthma, Chronic Obstructive Pulmonary Disease (COPD), Pneumothorax, Sleep Apnea, Tuberculosis Cardiovascular Patient has history of Congestive Heart Failure, Hypertension Denies history of Angina, Arrhythmia, Coronary Artery Disease, Deep Vein Thrombosis, Hypotension, Myocardial Infarction, Peripheral Arterial Disease, Peripheral Venous Disease, Phlebitis, Vasculitis Gastrointestinal Denies history of Cirrhosis , Colitis, Crohn s, Hepatitis A, Hepatitis B, Hepatitis C Endocrine Patient has history of Type II Diabetes Denies history of Type I Diabetes Genitourinary Denies  history of End Stage Renal Disease Immunological  Denies history of Lupus Erythematosus, Raynaud s, Scleroderma Integumentary (Skin) Denies history of History of Burn, History of pressure wounds Musculoskeletal Patient has history of Gout, Osteoarthritis Denies history of Rheumatoid Arthritis, Osteomyelitis Neurologic Patient has history of Neuropathy Denies history of Dementia, Quadriplegia, Paraplegia, Seizure Disorder Oncologic Denies history of Received Chemotherapy, Received Radiation Psychiatric Denies history of Anorexia/bulimia, Confinement Anxiety Miguel Torres, Miguel Torres. ([id number]) Review of Systems (ROS) Constitutional Symptoms (General Health) Denies complaints or symptoms of Fever, Chills. Respiratory The patient has no complaints or symptoms. Cardiovascular Complains or has symptoms of LE edema. Psychiatric The patient has no complaints or symptoms. Objective Constitutional Well-nourished and well-hydrated in no acute distress. Vitals Time Taken: 8:29 AM, Height: 68 in, Weight: 218 lbs, BMI: 33.1, Temperature: 97.5 F, Pulse: 99 bpm, Respiratory Rate: 20 breaths/min, Blood Pressure: 178/66 mmHg. Respiratory normal breathing without difficulty. Psychiatric this patient is able to make decisions and demonstrates good insight into disease process. Alert and Oriented x 3. pleasant and cooperative. General Notes: Patient's wound on the foot did require sharp debridement today this was performed without complication post debridement wound bed appears to be doing much better which is excellent. There does not appear to be any infection. No fevers chills noted Integumentary (Hair, Skin) Wound #2 status is Healed - Epithelialized. Original cause of wound was Gradually Appeared. The wound is located on the Left,Proximal Lower Leg. The wound measures 0cm length x 0cm width x 0cm depth; 0cm^2 area and 0cm^3 volume. There is Fat Layer (Subcutaneous Tissue) Exposed exposed.  There is no tunneling or undermining noted. There is a large amount of serous drainage noted. The wound margin is flat and intact. There is no granulation within the wound bed. There is a large (67-100%) amount of necrotic tissue within the wound bed including Adherent Slough. The periwound skin appearance exhibited: Dry/Scaly. The periwound skin appearance did not exhibit: Callus, Crepitus, Excoriation, Induration, Rash, Scarring, Maceration, Atrophie Blanche, Cyanosis, Ecchymosis, Hemosiderin Staining, Mottled, Pallor, Rubor, Erythema. Wound #3 status is Healed - Epithelialized. Original cause of wound was Gradually Appeared. The wound is located on the [facility] Lower Leg. The wound measures 0cm length x 0cm width x 0cm depth; 0cm^2 area and 0cm^3 volume. There is Fat Layer (Subcutaneous Tissue) Exposed exposed. There is no tunneling or undermining noted. There is a large amount of serous drainage noted. The wound margin is flat and intact. There is no granulation within the wound bed. There is a large (67-100%) amount of necrotic tissue within the wound bed including Adherent Slough. The periwound skin appearance exhibited: Dry/Scaly. The periwound skin appearance did not exhibit: Callus, Crepitus, Excoriation, Induration, Rash, Scarring, Maceration, Atrophie Blanche, Cyanosis, Ecchymosis, Hemosiderin Staining, Mottled, Pallor, Rubor, Erythema. Wound #4 status is Open. Original cause of wound was Gradually Appeared. The wound is located on the Left,Lateral Foot. The wound measures 3cm length x 1cm width x 0.1cm depth; 2.356cm^2 area and 0.236cm^3 volume. There is Fat Miguel Torres, Miguel Torres. ([id number]) (Subcutaneous Tissue) Exposed exposed. There is no tunneling or undermining noted. There is a large amount of serous drainage noted. The wound margin is flat and intact. There is no granulation within the wound bed. There is a large (67-100%) amount of necrotic tissue within the  wound bed including Adherent Slough. The periwound skin appearance exhibited: Dry/Scaly, Maceration. The periwound has tenderness on palpation. Wound #5 status is Healed - Epithelialized. Original cause of wound was Gradually Appeared. The wound is located on the Left,Posterior Lower Leg. The wound measures 0cm length  x 0cm width x 0cm depth; 0cm^2 area and 0cm^3 volume. There is Fat Layer (Subcutaneous Tissue) Exposed exposed. There is no tunneling or undermining noted. There is a large amount of serous drainage noted. The wound margin is flat and intact. There is no granulation within the wound bed. There is a large (67-100%) amount of necrotic tissue within the wound bed including Adherent Slough. The periwound skin appearance exhibited: Dry/Scaly. The periwound skin appearance did not exhibit: Callus, Crepitus, Excoriation, Induration, Rash, Scarring, Maceration, Atrophie Blanche, Cyanosis, Ecchymosis, Hemosiderin Staining, Mottled, Pallor, Rubor, Erythema. 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 There was a Excisional Skin/Subcutaneous Tissue Debridement with a total area of 3 sq cm performed by STONE III, Sollie Vultaggio E., PA-C. With the following instrument(s): Curette to remove Viable and Non-Viable tissue/material. Material removed includes Subcutaneous Tissue, Slough, and Skin: Dermis after achieving pain control using Lidocaine. No specimens were taken. A time out was conducted at  09:00, prior to the start of the procedure. A Minimum amount of bleeding was controlled with Pressure. The procedure was tolerated well. Post Debridement Measurements: 3cm length x 1cm width x 0.2cm depth; 0.471cm^3 volume. Character of Wound/Ulcer Post Debridement is stable. Severity of Tissue Post Debridement is: Fat layer exposed. Post procedure Diagnosis Wound #4: Same as Pre-Procedure Plan Wound Cleansing: Wound #4 Left,Lateral Foot: Clean wound with Normal Saline. May Shower, gently pat wound dry prior to applying new dressing. - may remove wrap and bathe leg in soap and water and then replace wrap Miguel Torres, Miguel Torres ([id number]) Anesthetic (add to Medication List): Wound #4 Left,Lateral Foot: Topical Lidocaine 4% cream applied to wound bed prior to debridement (In Clinic Only). Primary Wound Dressing: Wound #4 Left,Lateral Foot: Silver Alginate Secondary Dressing: Wound #4 Left,Lateral Foot: ABD pad Dressing Change Frequency: Wound #4 Left,Lateral Foot: Three times weekly - SNF to rewrap legs 3 times weekly Follow-up Appointments: Wound #4 Left,Lateral Foot: Return Appointment in 1 week. Edema Control: Wound #4 Left,Lateral Foot: Kerlix and Coban - Right Lower Extremity - SNF TO WRAP USING KERLIX AND COBAN LIGHTLY FROM THE TOES TO 3CM BELOW THE KNEE 3 Layer Compression System - Right Lower Extremity - THIS WRAP WILL BE USED BY [facility] ONLY I'm in a recommend that we continue with the above wound care measures for the next week and the patient is in agreement with that plan. We will subsequently see were things stand at follow-up. If anything changes or worsens meantime he will contact the office and let me know. Please see above for specific wound care orders. We will see patient for re-evaluation in 1 week(s) here in the clinic. If anything worsens or changes patient will contact our office for additional recommendations. Electronic  Signature(s) Signed: 07/02/2018 5:19:05 PM By: Worthy Keeler PA-C Entered By: Worthy Keeler on 07/02/2018 09:09:54 Miguel Torres ([id number]) -------------------------------------------------------------------------------- ROS/PFSH Details Patient Name: Miguel Torres Date of Service: 07/02/2018 8:30 AM Medical Record Number: [id number] Patient Account Number: [id number] Date of Birth/Sex: 13-Jul-1930 (85 y.o. M) Treating RN: Cornell Barman  Primary Care Provider: Lelon Huh Other Clinician: Referring Provider: Lelon Huh Treating Provider/Extender: Melburn Hake, Rayette Mogg Weeks in Treatment: 2 Information Obtained From Patient Wound History Do you currently have one or more open woundso Yes How many open wounds do you currently haveo 3 Approximately how long have you had your woundso 06/11/2018 How have you been treating your wound(s) until nowo unna, coban, xeroform Has your wound(s) ever healed and then re-openedo No Have you had any lab work done in the past montho No Have you tested positive for an antibiotic resistant organism (MRSA, VRE)o No Have you tested positive for osteomyelitis (bone infection)o No Have you had any tests for circulation on your legso Yes Who ordered the testo [facility] doctor Where was the test doneo [facility] Have you had other problems associated with your woundso Swelling Constitutional Symptoms (General Health) Complaints and Symptoms: Negative for: Fever; Chills Cardiovascular Complaints and Symptoms: Positive for: LE edema Medical History: Positive for: Congestive Heart Failure; Hypertension Negative for: Angina; Arrhythmia; Coronary Artery Disease; Deep Vein Thrombosis; Hypotension; Myocardial Infarction; Peripheral Arterial Disease; Peripheral Venous Disease; Phlebitis; Vasculitis Eyes Medical History: Positive for: Glaucoma Negative for: Cataracts; Optic Neuritis Ear/Nose/Mouth/Throat Medical History: Negative for: Chronic sinus  problems/congestion; Middle ear problems Hematologic/Lymphatic Medical History: Negative for: Anemia; Hemophilia; Human Immunodeficiency Virus; Lymphedema; Sickle Cell Disease Respiratory Miguel Torres, VANDYNE. ([id number]) Complaints and Symptoms: No Complaints or Symptoms Medical History: Negative for: Aspiration; Asthma; Chronic Obstructive Pulmonary Disease (COPD); Pneumothorax; Sleep Apnea; Tuberculosis Gastrointestinal Medical History: Negative for: Cirrhosis ; Colitis; Crohnos; Hepatitis A; Hepatitis B; Hepatitis C Endocrine Medical History: Positive for: Type II Diabetes Negative for: Type I Diabetes Time with diabetes: 10 years Treated with: Oral agents Blood sugar tested every day: Yes Tested : Genitourinary Medical History: Negative for: End Stage Renal Disease Immunological Medical History: Negative for: Lupus Erythematosus; Raynaudos; Scleroderma Integumentary (Skin) Medical History: Negative for: History of Burn; History of pressure wounds Musculoskeletal Medical History: Positive for: Gout; Osteoarthritis Negative for: Rheumatoid Arthritis; Osteomyelitis Neurologic Medical History: Positive for: Neuropathy Negative for: Dementia; Quadriplegia; Paraplegia; Seizure Disorder Oncologic Medical History: Negative for: Received Chemotherapy; Received Radiation Psychiatric Complaints and Symptoms: No Complaints or Symptoms Medical History: Miguel Torres, Miguel Torres ([id number]) Negative for: Anorexia/bulimia; Confinement Anxiety HBO Extended History Items Eyes: Glaucoma Immunizations Pneumococcal Vaccine: Received Pneumococcal Vaccination: Yes Implantable Devices No devices added Family and Social History Diabetes: Yes - Mother; Heart Disease: Yes - Mother; Hypertension: Yes - Mother; Kidney Disease: No; Lung Disease: No; Seizures: No; Stroke: No; Thyroid Problems: No; Tuberculosis: No; Never smoker; Marital Status - Married; Alcohol Use: Never; Drug Use: No  History; Caffeine Use: Daily; Advanced Directives: Yes (Not Provided); Patient does not want information on Advanced Directives; Living Will: Yes (Not Provided); Medical Power of Attorney: Yes (Copy provided) Physician Affirmation I have reviewed and agree with the above information. Electronic Signature(s) Signed: 07/02/2018 5:19:05 PM By: Worthy Keeler PA-C Signed: 07/05/2018 4:00:49 PM By: Gretta Cool, BSN, RN, CWS, Kim RN, BSN Entered By: Worthy Keeler on 07/02/2018 09:09:31 Miguel Torres, Miguel Torres ([id number]) -------------------------------------------------------------------------------- SuperBill Details Patient Name: Miguel Torres Date of Service: 07/02/2018 Medical Record Number: [id number] Patient Account Number: [id number] Date of Birth/Sex: [birth year] (85 y.o. M) Treating RN: Cornell Barman Primary Care Provider: Lelon Huh Other Clinician: Referring Provider: Lelon Huh Treating Provider/Extender: Melburn Hake, Zerina Hallinan Weeks in Treatment: 2 Diagnosis Coding ICD-10 Codes Code Description E11.621 Type 2 diabetes mellitus with foot ulcer I89.0 Lymphedema, not elsewhere classified L97.822 Non-pressure chronic ulcer of other part of left  lower leg with fat layer exposed L97.522 Non-pressure chronic ulcer of other part of left foot with fat layer exposed I73.89 Other specified peripheral vascular diseases Z89.511 Acquired absence of right leg below knee Facility Procedures CPT4 Code: 32761470 Description: 92957 - DEB SUBQ TISSUE 20 SQ CM/< ICD-10 Diagnosis Description L97.522 Non-pressure chronic ulcer of other part of left foot with fat Modifier: layer exposed Quantity: 1 Physician Procedures CPT4 Code: [id number] Description: 09643 - WC PHYS SUBQ TISS 20 SQ CM ICD-10 Diagnosis Description L97.522 Non-pressure chronic ulcer of other part of left foot with fat Modifier: layer exposed Quantity: 1 Electronic Signature(s) Signed: 07/02/2018 5:19:05 PM By: Worthy Keeler  PA-C Entered By: Worthy Keeler on 07/02/2018 09:10:03

## 2018-07-06 NOTE — Progress Notes (Signed)
Miguel Torres, Miguel Torres ([id number]) Visit Report for 07/02/2018 Arrival Information Details Patient Name: Miguel Torres, Miguel Torres Date of Service: 07/02/2018 8:30 AM Medical Record Number: [id number] Patient Account Number: [id number] Date of Birth/Sex: [birth year] (85 y.o. M) Treating RN: Cornell Barman Primary Care Deandre Stansel: Lelon Huh Other Clinician: Referring Emileigh Kellett: Lelon Huh Treating Deleah Tison/Extender: Melburn Hake, HOYT Weeks in Treatment: 2 Visit Information History Since Last Visit Added or deleted any medications: No Patient Arrived: Wheel Chair Any new allergies or adverse reactions: No Arrival Time: 08:27 Had a fall or experienced change in No Accompanied By: family activities of daily living that may affect Transfer Assistance: None risk of falls: Patient Identification Verified: Yes Signs or symptoms of abuse/neglect since last visito No Secondary Verification Process Yes Hospitalized since last visit: No Completed: Has Dressing in Place as Prescribed: Yes Patient Has Alerts: Yes Pain Present Now: No Patient Alerts: 01/19/2018 ABI L 0.81 AVVS Electronic Signature(s) Signed: 07/02/2018 3:07:51 PM By: Lorine Bears RCP, RRT, CHT Entered By: Lorine Bears on 07/02/2018 08:28:48 Miguel Torres ([id number]) -------------------------------------------------------------------------------- Encounter Discharge Information Details Patient Name: Miguel Torres Date of Service: 07/02/2018 8:30 AM Medical Record Number: [id number] Patient Account Number: [id number] Date of Birth/Sex: November 18, 1930 (85 y.o. M) Treating RN: Cornell Barman Primary Care Shonte Soderlund: Lelon Huh Other Clinician: Referring Yazaira Speas: Lelon Huh Treating Zeyad Delaguila/Extender: Melburn Hake, HOYT Weeks in Treatment: 2 Encounter Discharge Information Items Post Procedure Vitals Discharge Condition: Stable Temperature (F): 97.5 Ambulatory Status: Wheelchair Pulse (bpm):  99 Discharge Destination: Home Respiratory Rate (breaths/min): 20 Transportation: Private Auto Blood Pressure (mmHg): 178/66 Accompanied By: self Schedule Follow-up Appointment: Yes Clinical Summary of Care: Electronic Signature(s) Signed: 07/05/2018 4:00:49 PM By: Gretta Cool, BSN, RN, CWS, Kim RN, BSN Entered By: Gretta Cool, BSN, RN, CWS, Kim on 07/02/2018 09:07:12 Miguel Torres ([id number]) -------------------------------------------------------------------------------- Lower Extremity Assessment Details Patient Name: Miguel Torres Date of Service: 07/02/2018 8:30 AM Medical Record Number: [id number] Patient Account Number: [id number] Date of Birth/Sex: 1931/04/27 (85 y.o. M) Treating RN: Cornell Barman Primary Care Jolinda Pinkstaff: Lelon Huh Other Clinician: Referring Jessicamarie Amiri: Lelon Huh Treating Pratt Bress/Extender: Melburn Hake, HOYT Weeks in Treatment: 2 Edema Assessment Assessed: [Left: No] [Right: No] Edema: [Left: Ye] [Right: s] Calf Left: Right: Point of Measurement: 32 cm From Medial Instep 42 cm cm Ankle Left: Right: Point of Measurement: 12 cm From Medial Instep 22 cm cm Vascular Assessment Pulses: Dorsalis Pedis Palpable: [Left:Yes] Posterior Tibial Palpable: [Left:Yes] Extremity colors, hair growth, and conditions: Extremity Color: [Left:Hyperpigmented] Hair Growth on Extremity: [Left:No] Temperature of Extremity: [Left:Warm] Capillary Refill: [Left:< 3 seconds] Toe Nail Assessment Left: Right: Thick: Yes Discolored: Yes Deformed: Yes Improper Length and Hygiene: No Electronic Signature(s) Signed: 07/02/2018 3:07:51 PM By: Lorine Bears RCP, RRT, CHT Signed: 07/05/2018 4:00:49 PM By: Gretta Cool, BSN, RN, CWS, Kim RN, BSN Entered By: Lorine Bears on 07/02/2018 08:51:49 Miguel Torres ([id number]) -------------------------------------------------------------------------------- Multi Wound Chart Details Patient Name: Miguel Torres Date of Service: 07/02/2018 8:30 AM Medical Record Number: [id number] Patient Account Number: [id number] Date of Birth/Sex: 03-Jan-1931 (85 y.o. M) Treating RN: Cornell Barman Primary Care Alaycia Eardley: Lelon Huh Other Clinician: Referring Fintan Grater: Lelon Huh Treating Zunairah Devers/Extender: Melburn Hake, HOYT Weeks in Treatment: 2 Vital Signs Height(in): 68 Pulse(bpm): 99 Weight(lbs): 218 Blood Pressure(mmHg): 178/66 Body Mass Index(BMI): 33 Temperature(F): 97.5 Respiratory Rate 20 (breaths/min): Photos: [2:No Photos] [3:No Photos] [4:No Photos] Wound Location: [2:Left, Proximal Lower Leg] [3:Left, Distal, Anterior Lower Leg Left Foot - Lateral] Wounding Event: [2:Gradually Appeared] [3:Gradually Appeared] [4:Gradually Appeared] Primary Etiology: [  2:Diabetic Wound/Ulcer of the Lower Extremity] [3:Diabetic Wound/Ulcer of the Diabetic Wound/Ulcer of the Lower Extremity] [4:Lower Extremity] Comorbid History: [2:Glaucoma, Congestive Heart Failure, Hypertension, Type II Diabetes, Gout, Osteoarthritis, Neuropathy] [3:Glaucoma, Congestive Heart Glaucoma, Congestive Heart Failure, Hypertension, Type II Failure, Hypertension, Type II Diabetes,  Gout, Osteoarthritis, Diabetes, Gout, Osteoarthritis, Neuropathy] [4:Neuropathy] Date Acquired: [2:06/11/2018] [3:06/11/2018] [4:06/05/2018] Weeks of Treatment: [2:2] [3:2] [4:2] Wound Status: [2:Healed - Epithelialized] [3:Healed - Epithelialized] [4:Open] Measurements L x W x D [2:0x0x0] [3:0x0x0] [4:3x1x0.1] (cm) Area (cm) : [2:0] [3:0] [4:2.356] Volume (cm) : [2:0] [3:0] [4:0.236] % Reduction in Area: [2:100.00%] [3:100.00%] [4:25.00%] % Reduction in Volume: [2:100.00%] [3:100.00%] [4:24.80%] Classification: [2:Grade 2] [3:Grade 2] [4:Grade 2] Exudate Amount: [2:Large] [3:Large] [4:Large] Exudate Type: [2:Serous] [3:Serous] [4:Serous] Exudate Color: [2:amber] [3:amber] [4:amber] Wound Margin: [2:Flat and Intact] [3:Flat and Intact] [4:Flat and  Intact] Granulation Amount: [2:None Present (0%)] [3:None Present (0%)] [4:None Present (0%)] Necrotic Amount: [2:Large (67-100%)] [3:Large (67-100%)] [4:Large (67-100%)] Exposed Structures: [2:Fat Layer (Subcutaneous Tissue) Exposed: Yes Fascia: No Tendon: No Muscle: No Joint: No Bone: No] [3:Fat Layer (Subcutaneous Tissue) Exposed: Yes Fascia: No Tendon: No Muscle: No Joint: No Bone: No] [4:Fat Layer (Subcutaneous Tissue) Exposed: Yes  Fascia: No Tendon: No Muscle: No Joint: No Bone: No] Epithelialization: [2:None] [3:None] [4:None] Periwound Skin Texture: [2:Excoriation: No Induration: No Callus: No Crepitus: No] [3:Excoriation: No Induration: No Callus: No Crepitus: No] [4:No Abnormalities Noted] Rash: No Rash: No Scarring: No Scarring: No Periwound Skin Moisture: Dry/Scaly: Yes Dry/Scaly: Yes Maceration: Yes Maceration: No Maceration: No Dry/Scaly: Yes Periwound Skin Color: Atrophie Blanche: No Atrophie Blanche: No No Abnormalities Noted Cyanosis: No Cyanosis: No Ecchymosis: No Ecchymosis: No Erythema: No Erythema: No Hemosiderin Staining: No Hemosiderin Staining: No Mottled: No Mottled: No Pallor: No Pallor: No Rubor: No Rubor: No Tenderness on Palpation: No No Yes Wound Preparation: Ulcer Cleansing: Wound Ulcer Cleansing: Wound Ulcer Cleansing: Wound Cleanser Cleanser Cleanser Topical Anesthetic Applied: Topical Anesthetic Applied: Topical Anesthetic Applied: Other: lidocaine 4% Other: lidocaine 4% Other: lidocaine 4% Wound Number: 5 N/A N/A Photos: No Photos N/A N/A Wound Location: Left, Posterior Lower Leg N/A N/A Wounding Event: Gradually Appeared N/A N/A Primary Etiology: Diabetic Wound/Ulcer of the N/A N/A Lower Extremity Comorbid History: Glaucoma, Congestive Heart N/A N/A Failure, Hypertension, Type II Diabetes, Gout, Osteoarthritis, Neuropathy Date Acquired: 06/11/2018 N/A N/A Weeks of Treatment: 2 N/A N/A Wound Status: Healed - Epithelialized  N/A N/A Measurements L x W x D 0x0x0 N/A N/A (cm) Area (cm) : 0 N/A N/A Volume (cm) : 0 N/A N/A % Reduction in Area: 100.00% N/A N/A % Reduction in Volume: 100.00% N/A N/A Classification: Grade 2 N/A N/A Exudate Amount: Large N/A N/A Exudate Type: Serous N/A N/A Exudate Color: amber N/A N/A Wound Margin: Flat and Intact N/A N/A Granulation Amount: None Present (0%) N/A N/A Necrotic Amount: Large (67-100%) N/A N/A Exposed Structures: Fat Layer (Subcutaneous N/A N/A Tissue) Exposed: Yes Fascia: No Tendon: No Muscle: No Joint: No Bone: No Epithelialization: None N/A N/A Periwound Skin Texture: Excoriation: No N/A N/A Induration: No Callus: No Crepitus: No Miguel Torres, Miguel Torres ([id number]) Rash: No Scarring: No Periwound Skin Moisture: Dry/Scaly: Yes N/A N/A Maceration: No Periwound Skin Color: Atrophie Blanche: No N/A N/A Cyanosis: No Ecchymosis: No Erythema: No Hemosiderin Staining: No Mottled: No Pallor: No Rubor: No Tenderness on Palpation: No N/A N/A Wound Preparation: Ulcer Cleansing: Wound N/A N/A Cleanser Topical Anesthetic Applied: Other: lidocaine 4% Treatment Notes Electronic Signature(s) Signed: 07/05/2018 4:00:49 PM By: Gretta Cool, BSN, RN, CWS, Kim RN, BSN  Entered By: Gretta Cool, BSN, RN, CWS, Kim on 07/02/2018 09:02:08 Miguel Torres ([id number]) -------------------------------------------------------------------------------- [place] Details Patient Name: Miguel Torres, Miguel Torres Date of Service: 07/02/2018 8:30 AM Medical Record Number: [id number] Patient Account Number: [id number] Date of Birth/Sex: 08-04-1930 (85 y.o. M) Treating RN: Cornell Barman Primary Care Tarris Delbene: Lelon Huh Other Clinician: Referring Mandy Fitzwater: Lelon Huh Treating Isabeau Mccalla/Extender: Melburn Hake, HOYT Weeks in Treatment: 2 Active Inactive Abuse / Safety / Falls / Self Care Management Nursing Diagnoses: Potential for falls Goals: Patient will not  experience any injury related to falls Date Initiated: 06/15/2018 Target Resolution Date: 09/11/2018 Goal Status: Active Interventions: Assess fall risk on admission and as needed Notes: Necrotic Tissue Nursing Diagnoses: Impaired tissue integrity related to necrotic/devitalized tissue Knowledge deficit related to management of necrotic/devitalized tissue Goals: Necrotic/devitalized tissue will be minimized in the wound bed Date Initiated: 07/02/2018 Target Resolution Date: 07/02/2018 Goal Status: Active Interventions: Assess patient pain level pre-, during and post procedure and prior to discharge Treatment Activities: Apply topical anesthetic as ordered : 07/02/2018 Notes: Nutrition Nursing Diagnoses: Potential for alteratiion in Nutrition/Potential for imbalanced nutrition Goals: Patient/caregiver agrees to and verbalizes understanding of need to use nutritional supplements and/or vitamins as prescribed Date Initiated: 06/15/2018 Target Resolution Date: 09/11/2018 Miguel Torres, Miguel Torres ([id number]) Goal Status: Active Interventions: Assess patient nutrition upon admission and as needed per policy Notes: Venous Leg Ulcer Nursing Diagnoses: Potential for venous Insuffiency (use before diagnosis confirmed) Goals: Patient will maintain optimal edema control Date Initiated: 06/15/2018 Target Resolution Date: 09/11/2018 Goal Status: Active Interventions: Compression as ordered Notes: Wound/Skin Impairment Nursing Diagnoses: Impaired tissue integrity Goals: Ulcer/skin breakdown will heal within 14 weeks Date Initiated: 06/15/2018 Target Resolution Date: 09/11/2018 Goal Status: Active Interventions: Assess patient/caregiver ability to obtain necessary supplies Assess patient/caregiver ability to perform ulcer/skin care regimen upon admission and as needed Assess ulceration(s) every visit Notes: Electronic Signature(s) Signed: 07/05/2018 4:00:49 PM By: Gretta Cool, BSN, RN, CWS, Kim RN,  BSN Entered By: Gretta Cool, BSN, RN, CWS, Kim on 07/02/2018 09:02:01 Miguel Torres ([id number]) -------------------------------------------------------------------------------- Pain Assessment Details Patient Name: Miguel Torres Date of Service: 07/02/2018 8:30 AM Medical Record Number: [id number] Patient Account Number: [id number] Date of Birth/Sex: 31-Jan-1931 (85 y.o. M) Treating RN: Cornell Barman Primary Care Alzina Golda: Lelon Huh Other Clinician: Referring Brodey Bonn: Lelon Huh Treating Rhett Mutschler/Extender: Melburn Hake, HOYT Weeks in Treatment: 2 Active Problems Location of Pain Severity and Description of Pain Patient Has Paino No Site Locations Pain Management and Medication Current Pain Management: Electronic Signature(s) Signed: 07/02/2018 3:07:51 PM By: Lorine Bears RCP, RRT, CHT Signed: 07/05/2018 4:00:49 PM By: Gretta Cool, BSN, RN, CWS, Kim RN, BSN Entered By: Lorine Bears on 07/02/2018 08:29:37 Miguel Torres ([id number]) -------------------------------------------------------------------------------- Patient/Caregiver Education Details Patient Name: Miguel Torres Date of Service: 07/02/2018 8:30 AM Medical Record Number: [id number] Patient Account Number: [id number] Date of Birth/Gender: 09/18/1930 (85 y.o. M) Treating RN: Cornell Barman Primary Care Physician: Lelon Huh Other Clinician: Referring Physician: Lelon Huh Treating Physician/Extender: Sharalyn Ink in Treatment: 2 Education Assessment Education Provided To: Patient Education Topics Provided Venous: Handouts: Controlling Swelling with Multilayered Compression Wraps Methods: Demonstration, Explain/Verbal Responses: State content correctly Wound/Skin Impairment: Handouts: Caring for Your Ulcer Methods: Demonstration, Explain/Verbal Responses: State content correctly Electronic Signature(s) Signed: 07/05/2018 4:00:49 PM By: Gretta Cool, BSN, RN, CWS, Kim  RN, BSN Entered By: Gretta Cool, BSN, RN, CWS, Kim on 07/02/2018 09:05:18 Miguel Torres ([id number]) -------------------------------------------------------------------------------- Wound Assessment Details Patient Name: Miguel Torres Date of Service: 07/02/2018 8:30 AM Medical Record  Number: [id number] Patient Account Number: [id number] Date of Birth/Sex: 10/27/1930 (85 y.o. M) Treating RN: Cornell Barman Primary Care Tariana Moldovan: Lelon Huh Other Clinician: Referring Tandy Grawe: Lelon Huh Treating Therese Rocco/Extender: Melburn Hake, HOYT Weeks in Treatment: 2 Wound Status Wound Number: 2 Primary Diabetic Wound/Ulcer of the Lower Extremity Etiology: Wound Location: Left, Proximal Lower Leg Wound Healed - Epithelialized Wounding Event: Gradually Appeared Status: Date Acquired: 06/11/2018 Comorbid Glaucoma, Congestive Heart Failure, Weeks Of Treatment: 2 History: Hypertension, Type II Diabetes, Gout, Clustered Wound: No Osteoarthritis, Neuropathy Photos Photo Uploaded By: Harold Barban on 07/02/2018 13:41:01 Wound Measurements Length: (cm) 0 % Red Width: (cm) 0 % Red Depth: (cm) 0 Epith Area: (cm) 0 Tunn Volume: (cm) 0 Unde uction in Area: 100% uction in Volume: 100% elialization: None eling: No rmining: No Wound Description Classification: Grade 2 Wound Margin: Flat and Intact Exudate Amount: Large Exudate Type: Serous Exudate Color: amber Foul Odor After Cleansing: No Slough/Fibrino Yes Wound Bed Granulation Amount: None Present (0%) Exposed Structure Necrotic Amount: Large (67-100%) Fascia Exposed: No Necrotic Quality: Adherent Slough Fat Layer (Subcutaneous Tissue) Exposed: Yes Tendon Exposed: No Muscle Exposed: No Joint Exposed: No Bone Exposed: No Periwound Skin Texture Miguel Torres, Miguel Torres. ([id number]) Texture Color No Abnormalities Noted: No No Abnormalities Noted: No Callus: No Atrophie Blanche: No Crepitus: No Cyanosis: No Excoriation:  No Ecchymosis: No Induration: No Erythema: No Rash: No Hemosiderin Staining: No Scarring: No Mottled: No Pallor: No Moisture Rubor: No No Abnormalities Noted: No Dry / Scaly: Yes Maceration: No Wound Preparation Ulcer Cleansing: Wound Cleanser Topical Anesthetic Applied: Other: lidocaine 4%, Electronic Signature(s) Signed: 07/05/2018 4:00:49 PM By: Gretta Cool, BSN, RN, CWS, Kim RN, BSN Entered By: Gretta Cool, BSN, RN, CWS, Kim on 07/02/2018 09:01:22 Miguel Torres ([id number]) -------------------------------------------------------------------------------- Wound Assessment Details Patient Name: Miguel Torres Date of Service: 07/02/2018 8:30 AM Medical Record Number: [id number] Patient Account Number: [id number] Date of Birth/Sex: 14-Feb-1931 (85 y.o. M) Treating RN: Cornell Barman Primary Care Rylah Fukuda: Lelon Huh Other Clinician: Referring Sarea Fyfe: Lelon Huh Treating Remmy Riffe/Extender: Melburn Hake, HOYT Weeks in Treatment: 2 Wound Status Wound Number: 3 Primary Diabetic Wound/Ulcer of the Lower Extremity Etiology: Wound Location: Left, Distal, Anterior Lower Leg Wound Healed - Epithelialized Wounding Event: Gradually Appeared Status: Date Acquired: 06/11/2018 Comorbid Glaucoma, Congestive Heart Failure, Weeks Of Treatment: 2 History: Hypertension, Type II Diabetes, Gout, Clustered Wound: No Osteoarthritis, Neuropathy Photos Photo Uploaded By: Harold Barban on 07/02/2018 13:41:48 Wound Measurements Length: (cm) 0 % Red Width: (cm) 0 % Red Depth: (cm) 0 Epith Area: (cm) 0 Tunn Volume: (cm) 0 Unde uction in Area: 100% uction in Volume: 100% elialization: None eling: No rmining: No Wound Description Classification: Grade 2 Wound Margin: Flat and Intact Exudate Amount: Large Exudate Type: Serous Exudate Color: amber Foul Odor After Cleansing: No Slough/Fibrino Yes Wound Bed Granulation Amount: None Present (0%) Exposed Structure Necrotic Amount:  Large (67-100%) Fascia Exposed: No Necrotic Quality: Adherent Slough Fat Layer (Subcutaneous Tissue) Exposed: Yes Tendon Exposed: No Muscle Exposed: No Joint Exposed: No Bone Exposed: No Periwound Skin Texture Miguel Torres, Miguel Torres. ([id number]) Texture Color No Abnormalities Noted: No No Abnormalities Noted: No Callus: No Atrophie Blanche: No Crepitus: No Cyanosis: No Excoriation: No Ecchymosis: No Induration: No Erythema: No Rash: No Hemosiderin Staining: No Scarring: No Mottled: No Pallor: No Moisture Rubor: No No Abnormalities Noted: No Dry / Scaly: Yes Maceration: No Wound Preparation Ulcer Cleansing: Wound Cleanser Topical Anesthetic Applied: Other: lidocaine 4%, Electronic Signature(s) Signed: 07/05/2018 4:00:49 PM By: Gretta Cool, BSN, RN, CWS, Kim RN, BSN  Entered By: Gretta Cool, BSN, RN, CWS, Kim on 07/02/2018 09:01:22 Miguel Torres ([id number]) -------------------------------------------------------------------------------- Wound Assessment Details Patient Name: Miguel Torres Date of Service: 07/02/2018 8:30 AM Medical Record Number: [id number] Patient Account Number: [id number] Date of Birth/Sex: 1931-03-04 (85 y.o. M) Treating RN: Cornell Barman Primary Care Quantavis Obryant: Lelon Huh Other Clinician: Referring Myan Locatelli: Lelon Huh Treating Demarcus Thielke/Extender: Melburn Hake, HOYT Weeks in Treatment: 2 Wound Status Wound Number: 4 Primary Diabetic Wound/Ulcer of the Lower Extremity Etiology: Wound Location: Left Foot - Lateral Wound Open Wounding Event: Gradually Appeared Status: Date Acquired: 06/05/2018 Comorbid Glaucoma, Congestive Heart Failure, Weeks Of Treatment: 2 History: Hypertension, Type II Diabetes, Gout, Clustered Wound: No Osteoarthritis, Neuropathy Photos Photo Uploaded By: Harold Barban on 07/02/2018 13:41:49 Wound Measurements Length: (cm) 3 Width: (cm) 1 Depth: (cm) 0.1 Area: (cm) 2.356 Volume: (cm) 0.236 % Reduction in Area:  25% % Reduction in Volume: 24.8% Epithelialization: None Tunneling: No Undermining: No Wound Description Classification: Grade 2 Wound Margin: Flat and Intact Exudate Amount: Large Exudate Type: Serous Exudate Color: amber Foul Odor After Cleansing: No Slough/Fibrino Yes Wound Bed Granulation Amount: None Present (0%) Exposed Structure Necrotic Amount: Large (67-100%) Fascia Exposed: No Necrotic Quality: Adherent Slough Fat Layer (Subcutaneous Tissue) Exposed: Yes Tendon Exposed: No Muscle Exposed: No Joint Exposed: No Bone Exposed: No Periwound Skin Texture Miguel Torres, Miguel Torres. ([id number]) Texture Color No Abnormalities Noted: No No Abnormalities Noted: No Moisture Temperature / Pain No Abnormalities Noted: No Tenderness on Palpation: Yes Dry / Scaly: Yes Maceration: Yes Wound Preparation Ulcer Cleansing: Wound Cleanser Topical Anesthetic Applied: Other: lidocaine 4%, Treatment Notes Wound #4 (Left, Lateral Foot) 1. Cleansed with: Clean wound with Normal Saline Cleanse wound with antibacterial soap and water 2. Anesthetic Topical Lidocaine 4% cream to wound bed prior to debridement 3. Peri-wound Care: Moisturizing lotion 4. Dressing Applied: Other dressing (specify in notes) 5. Secondary Dressing Applied ABD Pad 7. Secured with 3 Layer Compression System - Left Lower Extremity Notes Silver cell, ABD Electronic Signature(s) Signed: 07/02/2018 3:07:51 PM By: Lorine Bears RCP, RRT, CHT Signed: 07/05/2018 4:00:49 PM By: Gretta Cool, BSN, RN, CWS, Kim RN, BSN Entered By: Lorine Bears on 07/02/2018 08:49:51 Miguel Torres ([id number]) -------------------------------------------------------------------------------- Wound Assessment Details Patient Name: Miguel Torres Date of Service: 07/02/2018 8:30 AM Medical Record Number: [id number] Patient Account Number: [id number] Date of Birth/Sex: 02/24/1931 (85 y.o. M) Treating RN:  Cornell Barman Primary Care Iza Preston: Lelon Huh Other Clinician: Referring Mildreth Reek: Lelon Huh Treating Kahiau Schewe/Extender: Melburn Hake, HOYT Weeks in Treatment: 2 Wound Status Wound Number: 5 Primary Diabetic Wound/Ulcer of the Lower Extremity Etiology: Wound Location: Left, Posterior Lower Leg Wound Healed - Epithelialized Wounding Event: Gradually Appeared Status: Date Acquired: 06/11/2018 Comorbid Glaucoma, Congestive Heart Failure, Weeks Of Treatment: 2 History: Hypertension, Type II Diabetes, Gout, Clustered Wound: No Osteoarthritis, Neuropathy Photos Photo Uploaded By: Harold Barban on 07/02/2018 13:42:18 Wound Measurements Length: (cm) 0 % Red Width: (cm) 0 % Red Depth: (cm) 0 Epith Area: (cm) 0 Tunn Volume: (cm) 0 Unde uction in Area: 100% uction in Volume: 100% elialization: None eling: No rmining: No Wound Description Classification: Grade 2 Wound Margin: Flat and Intact Exudate Amount: Large Exudate Type: Serous Exudate Color: amber Foul Odor After Cleansing: No Slough/Fibrino Yes Wound Bed Granulation Amount: None Present (0%) Exposed Structure Necrotic Amount: Large (67-100%) Fascia Exposed: No Necrotic Quality: Adherent Slough Fat Layer (Subcutaneous Tissue) Exposed: Yes Tendon Exposed: No Muscle Exposed: No Joint Exposed: No Bone Exposed: No [address] Miguel Torres, CAIRNS. ([id number])  Texture Color No Abnormalities Noted: No No Abnormalities Noted: No Callus: No Atrophie Blanche: No Crepitus: No Cyanosis: No Excoriation: No Ecchymosis: No Induration: No Erythema: No Rash: No Hemosiderin Staining: No Scarring: No Mottled: No Pallor: No Moisture Rubor: No No Abnormalities Noted: No Dry / Scaly: Yes Maceration: No Wound Preparation Ulcer Cleansing: Wound Cleanser Topical Anesthetic Applied: Other: lidocaine 4%, Electronic Signature(s) Signed: 07/05/2018 4:00:49 PM By: Gretta Cool, BSN, RN, CWS, Kim RN, BSN Entered  By: Gretta Cool, BSN, RN, CWS, Kim on 07/02/2018 09:01:23 Miguel Torres ([id number]) -------------------------------------------------------------------------------- Vitals Details Patient Name: Miguel Torres Date of Service: 07/02/2018 8:30 AM Medical Record Number: [id number] Patient Account Number: [id number] Date of Birth/Sex: 1931/03/13 (85 y.o. M) Treating RN: Cornell Barman Primary Care Ji Feldner: Lelon Huh Other Clinician: Referring Semiah Konczal: Lelon Huh Treating Petar Mucci/Extender: Melburn Hake, HOYT Weeks in Treatment: 2 Vital Signs Time Taken: 08:29 Temperature (F): 97.5 Height (in): 68 Pulse (bpm): 99 Weight (lbs): 218 Respiratory Rate (breaths/min): 20 Body Mass Index (BMI): 33.1 Blood Pressure (mmHg): 178/66 Reference Range: 80 - 120 mg / dl Electronic Signature(s) Signed: 07/02/2018 3:07:51 PM By: Lorine Bears RCP, RRT, CHT Entered By: Lorine Bears on 07/02/2018 08:32:31

## 2018-07-07 DIAGNOSIS — N401 Enlarged prostate with lower urinary tract symptoms: Secondary | ICD-10-CM | POA: Diagnosis not present

## 2018-07-07 DIAGNOSIS — E1122 Type 2 diabetes mellitus with diabetic chronic kidney disease: Secondary | ICD-10-CM | POA: Diagnosis not present

## 2018-07-07 DIAGNOSIS — N319 Neuromuscular dysfunction of bladder, unspecified: Secondary | ICD-10-CM | POA: Diagnosis not present

## 2018-07-09 ENCOUNTER — Encounter: Payer: Medicare Other | Attending: Physician Assistant | Admitting: Physician Assistant

## 2018-07-09 DIAGNOSIS — H409 Unspecified glaucoma: Secondary | ICD-10-CM | POA: Insufficient documentation

## 2018-07-09 DIAGNOSIS — L97522 Non-pressure chronic ulcer of other part of left foot with fat layer exposed: Secondary | ICD-10-CM | POA: Diagnosis not present

## 2018-07-09 DIAGNOSIS — I110 Hypertensive heart disease with heart failure: Secondary | ICD-10-CM | POA: Insufficient documentation

## 2018-07-09 DIAGNOSIS — M109 Gout, unspecified: Secondary | ICD-10-CM | POA: Diagnosis not present

## 2018-07-09 DIAGNOSIS — E11621 Type 2 diabetes mellitus with foot ulcer: Secondary | ICD-10-CM | POA: Insufficient documentation

## 2018-07-09 DIAGNOSIS — Z8249 Family history of ischemic heart disease and other diseases of the circulatory system: Secondary | ICD-10-CM | POA: Insufficient documentation

## 2018-07-09 DIAGNOSIS — I509 Heart failure, unspecified: Secondary | ICD-10-CM | POA: Diagnosis not present

## 2018-07-09 DIAGNOSIS — E1140 Type 2 diabetes mellitus with diabetic neuropathy, unspecified: Secondary | ICD-10-CM | POA: Insufficient documentation

## 2018-07-09 DIAGNOSIS — M1990 Unspecified osteoarthritis, unspecified site: Secondary | ICD-10-CM | POA: Diagnosis not present

## 2018-07-09 DIAGNOSIS — I890 Lymphedema, not elsewhere classified: Secondary | ICD-10-CM | POA: Diagnosis not present

## 2018-07-09 DIAGNOSIS — L97822 Non-pressure chronic ulcer of other part of left lower leg with fat layer exposed: Secondary | ICD-10-CM | POA: Insufficient documentation

## 2018-07-11 NOTE — Progress Notes (Signed)
Miguel Torres, Miguel Torres ([id number]) Visit Report for 07/09/2018 Chief Complaint Document Details Patient Name: Miguel Torres, Miguel Torres Date of Service: 07/09/2018 8:30 AM Medical Record Number: [id number] Patient Account Number: [id number] Date of Birth/Sex: [birth year] (85 y.o. M) Treating RN: Montey Hora Primary Care Provider: Lelon Huh Other Clinician: Referring Provider: Lelon Huh Treating Provider/Extender: Melburn Hake, HOYT Weeks in Treatment: 3 Information Obtained from: Patient Chief Complaint Left LE ulcers Electronic Signature(s) Signed: 07/10/2018 6:13:30 AM By: Worthy Keeler PA-C Entered By: Worthy Keeler on 07/09/2018 08:31:07 Miguel Torres ([id number]) -------------------------------------------------------------------------------- Debridement Details Patient Name: Miguel Torres Date of Service: 07/09/2018 8:30 AM Medical Record Number: [id number] Patient Account Number: [id number] Date of Birth/Sex: 1930-08-26 (85 y.o. M) Treating RN: Montey Hora Primary Care Provider: Lelon Huh Other Clinician: Referring Provider: Lelon Huh Treating Provider/Extender: Melburn Hake, HOYT Weeks in Treatment: 3 Debridement Performed for Wound #4 Left,Lateral Foot Assessment: Performed By: Physician STONE III, HOYT E., PA-C Debridement Type: Debridement Severity of Tissue Pre Fat layer exposed Debridement: Level of Consciousness (Pre- Awake and Alert procedure): Pre-procedure Verification/Time Yes - 09:05 Out Taken: Start Time: 09:05 Pain Control: Lidocaine 4% Topical Solution Total Area Debrided (L x W): 2.5 (cm) x 0.5 (cm) = 1.25 (cm) Tissue and other material Viable, Non-Viable, Slough, Subcutaneous, Skin: Dermis , Skin: Epidermis, Slough debrided: Level: Skin/Subcutaneous Tissue Debridement Description: Excisional Instrument: Curette Bleeding: Minimum Hemostasis Achieved: Pressure End Time: 09:07 Procedural Pain: 0 Post Procedural Pain:  0 Response to Treatment: Procedure was tolerated well Level of Consciousness Awake and Alert (Post-procedure): Post Debridement Measurements of Total Wound Length: (cm) 2.5 Width: (cm) 0.5 Depth: (cm) 0.2 Volume: (cm) 0.196 Character of Wound/Ulcer Post Debridement: Improved Severity of Tissue Post Debridement: Fat layer exposed Post Procedure Diagnosis Same as Pre-procedure Electronic Signature(s) Signed: 07/09/2018 4:27:08 PM By: Montey Hora Signed: 07/10/2018 6:13:30 AM By: Worthy Keeler PA-C Entered By: Montey Hora on 07/09/2018 09:06:52 Miguel Torres ([id number]) -------------------------------------------------------------------------------- HPI Details Patient Name: Miguel Torres Date of Service: 07/09/2018 8:30 AM Medical Record Number: [id number] Patient Account Number: [id number] Date of Birth/Sex: [birth year] (85 y.o. M) Treating RN: Montey Hora Primary Care Provider: Lelon Huh Other Clinician: Referring Provider: Lelon Huh Treating Provider/Extender: Melburn Hake, HOYT Weeks in Treatment: 3 History of Present Illness Associated Signs and Symptoms: Patient has a history of diabetes mellitus type II, lymphedema, a right below knee amputation which was performed 10 years ago, and proof of vascular disease which is managed by [facility], Dr. dew HPI Description: 01/19/18 on evaluation today patient presents for initial evaluation and clinic due to issues that he has been having with a left lateral foot ulcer which has been present for at minimum two months although this may have actually been longer before the family knew of the wound. Currently he has been seen [facility] where they have performed arterial studies. I did have those for review today which did show that he has mild left lower extremity arterial disease with a abnormal left toe brachial index. His ABI was 0.93 with a TBI of 0.26. Upon further evaluation  it appears that the patient actually was recommended to have an angiogram by Dr. dew. This was due to the fact that there was concerned about the low TBI and the fact that he may not be perfusion all the way down into his foot. With that being said the final decision as to whether or not to proceed with this has not been made as of  yet according to the patient's daughter who was present for evaluation at this time today. The patient has no evidence of infection at this time. With that being said there is bone exposed in the base of the wound unfortunately. This obviously may indicate a more significant issue underlying just the open wound. Other than that he actually appears to have a significant amount of epithelialization noted which is excellent news. No fevers, chills, nausea, or vomiting noted at this time. He has had an x-ray which revealed no evidence of osteomyelitis this was performed July 2019. No MRI has been performed at this point. 01/28/18 on evaluation today patient actually appears to be doing about the same in regard to his left lateral foot ulcer. He does not have as much is the way of necrotic tissue overlying the surface of the wound in fact no sharp debridement was necessary. With that being said he does have some maceration noted. He may benefit from adding alginate over the Prisma along the lateral aspect of his wound. In regard to the Kerlex and Coban wrap it appears that this actually was not wrapped as high as it should've been and therefore the patient felt blisters on the lateral portion of his upper left lower extremity. 02/08/18 on evaluation today patient presents today for follow-up after having had his MRI which was performed on 02/05/18. This MRI showed that there did not appear to be any evidence of osteomyelitis of the left foot or ankle at this time. He did have a prior midfoot arthrodesis with solid osseous fusion. Nonetheless in general the patient seems to be  doing excellent at this point in time which is great news. Overall very pleased with the fact there is no infection and the fact that his wound actually seems to be doing better. 02/19/18 on evaluation today patient appears to be doing excellent in regard to his foot ulcer. He's been tolerating the dressing changes without complication. Fortunately there's no sign of infection and the wound appears to be completely healed. The patient and his daughter are both extremely pleased. Readmission: 06/15/18 on evaluation today patient appears to be doing a little poorly in regard to his left lower extremity where he has significant swelling. He's previously had swelling here as well unfortunately due to the swelling that occurred over the past week he has blisters that arose on his leg and then subsequently the scar along the left lateral foot which was previously closed when he was here in the clinic reopened I believe this is simply due to the fact that it's a weak area that due to the swelling the fluid found this region to get out of. Fortunately there's no signs of infection at this time. No fevers, chills, nausea, or vomiting noted at this time. Patient has no pain at any site which is good news. He has been spending a lot of time in his wheelchair with his feet down I think this is bad for him as well. He also apparently drink a lot of pickle juice specifically just prior to this happening I'm not sure of the salt content but I wonder if this may have had something to do with his increased swelling. He does have congestive heart failure but doesn't have any trouble or difficulty breathing right now this is good news. 07/02/18 on evaluation today patient appears to be doing rather well in regard to his left lower extremity. In fact the foot Miguel Torres, Miguel Torres. ([id number]) appears to have openings still  noted but the remainder them leg actually appears to be completely closed which is great news.  Fortunately there's no signs of active infection at this time. He's been tolerating the dressing changes without complication. 07/09/18 on evaluation today patient actually appears to be doing well in regard his foot ulcer which is measuring smaller today. Fortunately there's no signs of infection at this time which is good news. Overall very pleased with how things seem to be appearing. With that being said I do believe that we would likely want to continue with the Current wound care measures since everything seems to be going so well. He's having no pain which is also good news. Electronic Signature(s) Signed: 07/10/2018 6:13:30 AM By: Worthy Keeler PA-C Entered By: Worthy Keeler on 07/09/2018 09:45:34 Miguel Torres ([id number]) -------------------------------------------------------------------------------- Physical Exam Details Patient Name: Miguel Torres Date of Service: 07/09/2018 8:30 AM Medical Record Number: [id number] Patient Account Number: [id number] Date of Birth/Sex: [birth year] (85 y.o. M) Treating RN: Montey Hora Primary Care Provider: Lelon Huh Other Clinician: Referring Provider: Lelon Huh Treating Provider/Extender: Melburn Hake, HOYT Weeks in Treatment: 3 Constitutional Well-nourished and well-hydrated in no acute distress. Respiratory normal breathing without difficulty. Psychiatric this patient is able to make decisions and demonstrates good insight into disease process. Alert and Oriented x 3. pleasant and cooperative. Notes Patient's wound bed currently shows evidence of good granulation at this time. Fortunately there is no sign of infection which is also excellent news. No fevers chills noted Electronic Signature(s) Signed: 07/10/2018 6:13:30 AM By: Worthy Keeler PA-C Entered By: Worthy Keeler on 07/09/2018 09:46:09 Miguel Torres  ([id number]) -------------------------------------------------------------------------------- Physician Orders Details Patient Name: Miguel Torres Date of Service: 07/09/2018 8:30 AM Medical Record Number: [id number] Patient Account Number: [id number] Date of Birth/Sex: [birth year] (85 y.o. M) Treating RN: Montey Hora Primary Care Provider: Lelon Huh Other Clinician: Referring Provider: Lelon Huh Treating Provider/Extender: Melburn Hake, HOYT Weeks in Treatment: 3 Verbal / Phone Orders: No Diagnosis Coding ICD-10 Coding Code Description E11.621 Type 2 diabetes mellitus with foot ulcer I89.0 Lymphedema, not elsewhere classified L97.822 Non-pressure chronic ulcer of other part of left lower leg with fat layer exposed L97.522 Non-pressure chronic ulcer of other part of left foot with fat layer exposed I73.89 Other specified peripheral vascular diseases Z89.511 Acquired absence of right leg below knee Wound Cleansing Wound #4 Left,Lateral Foot o Clean wound with Normal Saline. o May Shower, gently pat wound dry prior to applying new dressing. - may remove wrap and bathe leg in soap and water and then replace wrap Anesthetic (add to Medication List) Wound #4 Left,Lateral Foot o Topical Lidocaine 4% cream applied to wound bed prior to debridement (In Clinic Only). Primary Wound Dressing Wound #4 Left,Lateral Foot o Silver Alginate Secondary Dressing Wound #4 Left,Lateral Foot o ABD pad Dressing Change Frequency Wound #4 Left,Lateral Foot o Three times weekly - SNF to rewrap legs 3 times weekly Follow-up Appointments Wound #4 Left,Lateral Foot o Return Appointment in 2 weeks. Edema Control Wound #4 Left,Lateral Foot o Kerlix and Coban - Right Lower Extremity - SNF TO WRAP USING [facility] AND [place] LIGHTLY FROM THE TOES TO 3CM BELOW THE KNEE Miguel Torres, Miguel Torres ([id number]) o 3 Layer Compression System - Left Lower Extremity - THIS WRAP WILL BE USED BY  [facility] ONLY Electronic Signature(s) Signed: 07/09/2018 4:58:22 PM By: Gretta Cool, BSN, RN, CWS, Kim RN, BSN Signed: 07/10/2018 6:13:30 AM By: Worthy Keeler PA-C Entered By: Gretta Cool, BSN,  RN, CWS, Kim on 07/09/2018 09:14:05 Miguel Torres, Miguel Torres ([id number]) -------------------------------------------------------------------------------- Problem List Details Patient Name: Miguel Torres, Miguel Torres Date of Service: 07/09/2018 8:30 AM Medical Record Number: [id number] Patient Account Number: [id number] Date of Birth/Sex: 08/25/1930 (85 y.o. M) Treating RN: Montey Hora Primary Care Provider: Lelon Huh Other Clinician: Referring Provider: Lelon Huh Treating Provider/Extender: Melburn Hake, HOYT Weeks in Treatment: 3 Active Problems ICD-10 Evaluated Encounter Code Description Active Date Today Diagnosis E11.621 Type 2 diabetes mellitus with foot ulcer 06/15/2018 No Yes I89.0 Lymphedema, not elsewhere classified 06/15/2018 No Yes L97.822 Non-pressure chronic ulcer of other part of left lower leg with 06/15/2018 No Yes fat layer exposed L97.522 Non-pressure chronic ulcer of other part of left foot with fat 06/15/2018 No Yes layer exposed I73.89 Other specified peripheral vascular diseases 06/15/2018 No Yes Z89.511 Acquired absence of right leg below knee 06/15/2018 No Yes Inactive Problems Resolved Problems Electronic Signature(s) Signed: 07/10/2018 6:13:30 AM By: Worthy Keeler PA-C Entered By: Worthy Keeler on 07/09/2018 08:31:02 Miguel Torres ([id number]) -------------------------------------------------------------------------------- Progress Note Details Patient Name: Miguel Torres Date of Service: 07/09/2018 8:30 AM Medical Record Number: [id number] Patient Account Number: [id number] Date of Birth/Sex: 1930/09/08 (85 y.o. M) Treating RN: Montey Hora Primary Care Provider: Lelon Huh Other Clinician: Referring Provider: Lelon Huh Treating  Provider/Extender: Melburn Hake, HOYT Weeks in Treatment: 3 Subjective Chief Complaint Information obtained from Patient Left LE ulcers History of Present Illness (HPI) The following HPI elements were documented for the patient's wound: Associated Signs and Symptoms: Patient has a history of diabetes mellitus type II, lymphedema, a right below knee amputation which was performed 10 years ago, and proof of vascular disease which is managed by [facility], Dr. dew 01/19/18 on evaluation today patient presents for initial evaluation and clinic due to issues that he has been having with a left lateral foot ulcer which has been present for at minimum two months although this may have actually been longer before the family knew of the wound. Currently he has been seen [facility] where they have performed arterial studies. I did have those for review today which did show that he has mild left lower extremity arterial disease with a abnormal left toe brachial index. His ABI was 0.93 with a TBI of 0.26. Upon further evaluation it appears that the patient actually was recommended to have an angiogram by Dr. dew. This was due to the fact that there was concerned about the low TBI and the fact that he may not be perfusion all the way down into his foot. With that being said the final decision as to whether or not to proceed with this has not been made as of yet according to the patient's daughter who was present for evaluation at this time today. The patient has no evidence of infection at this time. With that being said there is bone exposed in the base of the wound unfortunately. This obviously may indicate a more significant issue underlying just the open wound. Other than that he actually appears to have a significant amount of epithelialization noted which is excellent news. No fevers, chills, nausea, or vomiting noted at this time. He has had an x-ray which revealed no  evidence of osteomyelitis this was performed July 2019. No MRI has been performed at this point. 01/28/18 on evaluation today patient actually appears to be doing about the same in regard to his left lateral foot ulcer. He does not have as much is the way  of necrotic tissue overlying the surface of the wound in fact no sharp debridement was necessary. With that being said he does have some maceration noted. He may benefit from adding alginate over the Prisma along the lateral aspect of his wound. In regard to the Kerlex and Coban wrap it appears that this actually was not wrapped as high as it should've been and therefore the patient felt blisters on the lateral portion of his upper left lower extremity. 02/08/18 on evaluation today patient presents today for follow-up after having had his MRI which was performed on 02/05/18. This MRI showed that there did not appear to be any evidence of osteomyelitis of the left foot or ankle at this time. He did have a prior midfoot arthrodesis with solid osseous fusion. Nonetheless in general the patient seems to be doing excellent at this point in time which is great news. Overall very pleased with the fact there is no infection and the fact that his wound actually seems to be doing better. 02/19/18 on evaluation today patient appears to be doing excellent in regard to his foot ulcer. He's been tolerating the dressing changes without complication. Fortunately there's no sign of infection and the wound appears to be completely healed. The patient and his daughter are both extremely pleased. Readmission: 06/15/18 on evaluation today patient appears to be doing a little poorly in regard to his left lower extremity where he has significant swelling. He's previously had swelling here as well unfortunately due to the swelling that occurred over the past week he has blisters that arose on his leg and then subsequently the scar along the left lateral foot which was  previously closed when he was here in the clinic reopened I believe this is simply due to the fact that it's a weak area that due to the [place] ([id number]) swelling the fluid found this region to get out of. Fortunately there's no signs of infection at this time. No fevers, chills, nausea, or vomiting noted at this time. Patient has no pain at any site which is good news. He has been spending a lot of time in his wheelchair with his feet down I think this is bad for him as well. He also apparently drink a lot of pickle juice specifically just prior to this happening I'm not sure of the salt content but I wonder if this may have had something to do with his increased swelling. He does have congestive heart failure but doesn't have any trouble or difficulty breathing right now this is good news. 07/02/18 on evaluation today patient appears to be doing rather well in regard to his left lower extremity. In fact the foot appears to have openings still noted but the remainder them leg actually appears to be completely closed which is great news. Fortunately there's no signs of active infection at this time. He's been tolerating the dressing changes without complication. 07/09/18 on evaluation today patient actually appears to be doing well in regard his foot ulcer which is measuring smaller today. Fortunately there's no signs of infection at this time which is good news. Overall very pleased with how things seem to be appearing. With that being said I do believe that we would likely want to continue with the Current wound care measures since everything seems to be going so well. He's having no pain which is also good news. Patient History Information obtained from Patient. Family History Diabetes - Mother, Heart Disease - Mother, Hypertension - Mother, No family  history of Kidney Disease, Lung Disease, Seizures, Stroke, Thyroid Problems, Tuberculosis. Social History Never smoker, Marital  Status - Married, Alcohol Use - Never, Drug Use - No History, Caffeine Use - Daily. Medical History Eyes Patient has history of Glaucoma Denies history of Cataracts, Optic Neuritis Ear/Nose/Mouth/Throat Denies history of Chronic sinus problems/congestion, Middle ear problems Hematologic/Lymphatic Denies history of Anemia, Hemophilia, Human Immunodeficiency Virus, Lymphedema, Sickle Cell Disease Respiratory Denies history of Aspiration, Asthma, Chronic Obstructive Pulmonary Disease (COPD), Pneumothorax, Sleep Apnea, Tuberculosis Cardiovascular Patient has history of Congestive Heart Failure, Hypertension Denies history of Angina, Arrhythmia, Coronary Artery Disease, Deep Vein Thrombosis, Hypotension, Myocardial Infarction, Peripheral Arterial Disease, Peripheral Venous Disease, Phlebitis, Vasculitis Gastrointestinal Denies history of Cirrhosis , Colitis, Crohn s, Hepatitis A, Hepatitis B, Hepatitis C Endocrine Patient has history of Type II Diabetes Denies history of Type I Diabetes Genitourinary Denies history of End Stage Renal Disease Immunological Denies history of Lupus Erythematosus, Raynaud s, Scleroderma Integumentary (Skin) Denies history of History of Burn, History of pressure wounds Musculoskeletal Patient has history of Gout, Osteoarthritis Denies history of Rheumatoid Arthritis, Osteomyelitis Neurologic Patient has history of Neuropathy Denies history of Dementia, Quadriplegia, Paraplegia, Seizure Disorder Miguel Torres, Miguel Torres ([id number]) Oncologic Denies history of Received Chemotherapy, Received Radiation Psychiatric Denies history of Anorexia/bulimia, Confinement Anxiety Review of Systems (ROS) Constitutional Symptoms (General Health) Denies complaints or symptoms of Fever, Chills. Respiratory The patient has no complaints or symptoms. Cardiovascular The patient has no complaints or symptoms. Psychiatric The patient has no complaints or  symptoms. Objective Constitutional Well-nourished and well-hydrated in no acute distress. Vitals Time Taken: 8:36 AM, Height: 68 in, Weight: 218 lbs, BMI: 33.1, Temperature: 98.1 F, Pulse: 82 bpm, Respiratory Rate: 18 breaths/min, Blood Pressure: 170/63 mmHg. Respiratory normal breathing without difficulty. Psychiatric this patient is able to make decisions and demonstrates good insight into disease process. Alert and Oriented x 3. pleasant and cooperative. General Notes: Patient's wound bed currently shows evidence of good granulation at this time. Fortunately there is no sign of infection which is also excellent news. No fevers chills noted Integumentary (Hair, Skin) Wound #4 status is Open. Original cause of wound was Gradually Appeared. The wound is located on the Left,Lateral Foot. The wound measures 2.5cm length x 0.5cm width x 0.1cm depth; 0.982cm^2 area and 0.098cm^3 volume. There is Fat Layer (Subcutaneous Tissue) Exposed exposed. There is no tunneling or undermining noted. There is a medium amount of serous drainage noted. The wound margin is flat and intact. There is small (1-33%) pink granulation within the wound bed. There is a large (67-100%) amount of necrotic tissue within the wound bed including Adherent Slough. The periwound skin appearance exhibited: Callus, Dry/Scaly. The periwound skin appearance did not exhibit: Crepitus, Excoriation, Induration, Miguel Torres, Scarring, Maceration, Atrophie Blanche, Cyanosis, Ecchymosis, Hemosiderin Staining, Mottled, Pallor, Rubor, Erythema. The periwound has tenderness on palpation. Assessment Miguel Torres, Miguel Torres ([id number]) Active Problems ICD-10 Type 2 diabetes mellitus with foot ulcer Lymphedema, not elsewhere classified Non-pressure chronic ulcer of other part of left lower leg with fat layer exposed Non-pressure chronic ulcer of other part of left foot with fat layer exposed Other specified peripheral vascular diseases Acquired  absence of right leg below knee Procedures Wound #4 Pre-procedure diagnosis of Wound #4 is a Diabetic Wound/Ulcer of the Lower Extremity located on the Left,Lateral Foot .Severity of Tissue Pre Debridement is: Fat layer exposed. There was a Excisional Skin/Subcutaneous Tissue Debridement with a total area of 1.25 sq cm performed by STONE III, HOYT E., PA-C. With the following  instrument(s): Curette to remove Viable and Non-Viable tissue/material. Material removed includes Subcutaneous Tissue, Slough, Skin: Dermis, and Skin: Epidermis after achieving pain control using Lidocaine 4% Topical Solution. No specimens were taken. A time out was conducted at 09:05, prior to the start of the procedure. A Minimum amount of bleeding was controlled with Pressure. The procedure was tolerated well with a pain level of 0 throughout and a pain level of 0 following the procedure. Post Debridement Measurements: 2.5cm length x 0.5cm width x 0.2cm depth; 0.196cm^3 volume. Character of Wound/Ulcer Post Debridement is improved. Severity of Tissue Post Debridement is: Fat layer exposed. Post procedure Diagnosis Wound #4: Same as Pre-Procedure Plan Wound Cleansing: Wound #4 Left,Lateral Foot: Clean wound with Normal Saline. May Shower, gently pat wound dry prior to applying new dressing. - may remove wrap and bathe leg in soap and water and then replace wrap Anesthetic (add to Medication List): Wound #4 Left,Lateral Foot: Topical Lidocaine 4% cream applied to wound bed prior to debridement (In Clinic Only). Primary Wound Dressing: Wound #4 Left,Lateral Foot: Silver Alginate Secondary Dressing: Wound #4 Left,Lateral Foot: ABD pad Dressing Change Frequency: Wound #4 Left,Lateral Foot: Three times weekly - SNF to rewrap legs 3 times weekly Follow-up Appointments: Wound #4 Left,Lateral Foot: Return Appointment in 2 weeks. Edema Control: Wound #4 Left,Lateral Foot: Miguel Torres, Miguel Torres. ([id number]) Kerlix and  Coban - Right Lower Extremity - SNF TO WRAP USING KERLIX AND COBAN LIGHTLY FROM THE TOES TO 3CM BELOW THE KNEE 3 Layer Compression System - Left Lower Extremity - THIS WRAP WILL BE USED BY Miguel Torres [facility] My suggestion currently is gonna be that we go ahead and initiate the above wound care measures for the next week. Patient is in agreement with plan. I do think that continuing with what we been doing will do very well for him is making progress up to this point and I expect that continue to be the case. If anything changes he will contact the office and let me know. Please see above for specific wound care orders. We will see patient for re-evaluation in 2 week(s) here in the clinic. If anything worsens or changes patient will contact our office for additional recommendations. patient's daughter was present during the evaluation and treatment today. Electronic Signature(s) Signed: 07/10/2018 6:13:30 AM By: Worthy Keeler PA-C Entered By: Worthy Keeler on 07/09/2018 09:46:36 Miguel Torres ([id number]) -------------------------------------------------------------------------------- ROS/PFSH Details Patient Name: Miguel Torres Date of Service: 07/09/2018 8:30 AM Medical Record Number: [id number] Patient Account Number: [id number] Date of Birth/Sex: 1931/01/16 (85 y.o. M) Treating RN: Montey Hora Primary Care Provider: Lelon Huh Other Clinician: Referring Provider: Lelon Huh Treating Provider/Extender: Melburn Hake, HOYT Weeks in Treatment: 3 Information Obtained From Patient Wound History Do you currently have one or more open woundso Yes How many open wounds do you currently haveo 3 Approximately how long have you had your woundso 06/11/2018 How have you been treating your wound(s) until nowo unna, coban, xeroform Has your wound(s) ever healed and then re-openedo No Have you had any lab work done in the past montho No Have you tested positive for an  antibiotic resistant organism (MRSA, VRE)o No Have you tested positive for osteomyelitis (bone infection)o No Have you had any tests for circulation on your legso Yes Who ordered the testo [place] doctor Where was the test doneo [facility] Have you had other problems associated with your woundso Swelling Constitutional Symptoms (General Health) Complaints and Symptoms: Negative for: Fever; Chills  Eyes Medical History: Positive for: Glaucoma Negative for: Cataracts; Optic Neuritis Ear/Nose/Mouth/Throat Medical History: Negative for: Chronic sinus problems/congestion; Middle ear problems Hematologic/Lymphatic Medical History: Negative for: Anemia; Hemophilia; Human Immunodeficiency Virus; Lymphedema; Sickle Cell Disease Respiratory Complaints and Symptoms: No Complaints or Symptoms Medical History: Negative for: Aspiration; Asthma; Chronic Obstructive Pulmonary Disease (COPD); Pneumothorax; Sleep Apnea; Tuberculosis Cardiovascular Miguel Torres, Miguel Torres ([id number]) Complaints and Symptoms: No Complaints or Symptoms Medical History: Positive for: Congestive Heart Failure; Hypertension Negative for: Angina; Arrhythmia; Coronary Artery Disease; Deep Vein Thrombosis; Hypotension; Myocardial Infarction; Peripheral Arterial Disease; Peripheral Venous Disease; Phlebitis; Vasculitis Gastrointestinal Medical History: Negative for: Cirrhosis ; Colitis; Crohnos; Hepatitis A; Hepatitis B; Hepatitis C Endocrine Medical History: Positive for: Type II Diabetes Negative for: Type I Diabetes Time with diabetes: 10 years Treated with: Oral agents Blood sugar tested every day: Yes Tested : Genitourinary Medical History: Negative for: End Stage Renal Disease Immunological Medical History: Negative for: Lupus Erythematosus; Raynaudos; Scleroderma Integumentary (Skin) Medical History: Negative for: History of Burn; History of pressure wounds Musculoskeletal Medical History: Positive  for: Gout; Osteoarthritis Negative for: Rheumatoid Arthritis; Osteomyelitis Neurologic Medical History: Positive for: Neuropathy Negative for: Dementia; Quadriplegia; Paraplegia; Seizure Disorder Oncologic Medical History: Negative for: Received Chemotherapy; Received Radiation Psychiatric Complaints and Symptoms: No Complaints or Symptoms Miguel Torres, ZEBROWSKI ([id number]) Medical History: Negative for: Anorexia/bulimia; Confinement Anxiety HBO Extended History Items Eyes: Glaucoma Immunizations Pneumococcal Vaccine: Received Pneumococcal Vaccination: Yes Implantable Devices No devices added Family and Social History Diabetes: Yes - Mother; Heart Disease: Yes - Mother; Hypertension: Yes - Mother; Kidney Disease: No; Lung Disease: No; Seizures: No; Stroke: No; Thyroid Problems: No; Tuberculosis: No; Never smoker; Marital Status - Married; Alcohol Use: Never; Drug Use: No History; Caffeine Use: Daily; Advanced Directives: Yes (Not Provided); Patient does not want information on Advanced Directives; Living Will: Yes (Not Provided); Medical Power of Attorney: Yes (Copy provided) Physician Affirmation I have reviewed and agree with the above information. Electronic Signature(s) Signed: 07/09/2018 4:27:08 PM By: Montey Hora Signed: 07/10/2018 6:13:30 AM By: Worthy Keeler PA-C Entered By: Worthy Keeler on 07/09/2018 09:45:57 Miguel Torres ([id number]) -------------------------------------------------------------------------------- SuperBill Details Patient Name: Miguel Torres Date of Service: 07/09/2018 Medical Record Number: [id number] Patient Account Number: [id number] Date of Birth/Sex: Oct 24, 1930 (85 y.o. M) Treating RN: Montey Hora Primary Care Provider: Lelon Huh Other Clinician: Referring Provider: Lelon Huh Treating Provider/Extender: Melburn Hake, HOYT Weeks in Treatment: 3 Diagnosis Coding ICD-10 Codes Code Description E11.621 Type 2 diabetes  mellitus with foot ulcer I89.0 Lymphedema, not elsewhere classified L97.822 Non-pressure chronic ulcer of other part of left lower leg with fat layer exposed L97.522 Non-pressure chronic ulcer of other part of left foot with fat layer exposed I73.89 Other specified peripheral vascular diseases Z89.511 Acquired absence of right leg below knee Facility Procedures CPT4 Code: 56387564 Description: 33295 - DEB SUBQ TISSUE 20 SQ CM/< ICD-10 Diagnosis Description L97.522 Non-pressure chronic ulcer of other part of left foot with fat Modifier: layer exposed Quantity: 1 Physician Procedures CPT4 Code: 1884166 Description: 11042 - WC PHYS SUBQ TISS 20 SQ CM ICD-10 Diagnosis Description L97.522 Non-pressure chronic ulcer of other part of left foot with fat Modifier: layer exposed Quantity: 1 Electronic Signature(s) Signed: 07/10/2018 6:13:30 AM By: Worthy Keeler PA-C Entered By: Worthy Keeler on 07/09/2018 09:46:54

## 2018-07-11 NOTE — Progress Notes (Addendum)
JOMAR, DENZ ([id number]) Visit Report for 07/09/2018 Arrival Information Details Patient Name: Miguel Torres, Miguel Torres Date of Service: 07/09/2018 8:30 AM Medical Record Number: [id number] Patient Account Number: [id number] Date of Birth/Sex: Feb 13, 1931 (85 y.o. M) Treating RN: Montey Hora Primary Care Orrie Schubert: Lelon Huh Other Clinician: Referring Rylah Fukuda: Lelon Huh Treating Kellon Chalk/Extender: Melburn Hake, HOYT Weeks in Treatment: 3 Visit Information History Since Last Visit Added or deleted any medications: No Patient Arrived: Wheel Chair Any new allergies or adverse reactions: No Arrival Time: 08:35 Had a fall or experienced change in No Accompanied By: daughter activities of daily living that may affect Transfer Assistance: None risk of falls: Patient Identification Verified: Yes Signs or symptoms of abuse/neglect since last visito No Secondary Verification Process Yes Hospitalized since last visit: No Completed: Implantable device outside of the clinic excluding No Patient Has Alerts: Yes cellular tissue based products placed in the center Patient Alerts: 01/19/2018 ABI L 0.81 since last visit: [facility] Has Dressing in Place as Prescribed: Yes Pain Present Now: No Electronic Signature(s) Signed: 07/09/2018 3:08:28 PM By: Lorine Bears RCP, RRT, CHT Entered By: Lorine Bears on 07/09/2018 08:36:31 [name] ([id number]) -------------------------------------------------------------------------------- Encounter Discharge Information Details Patient Name: [name] Date of Service: 07/09/2018 8:30 AM Medical Record Number: [id number] Patient Account Number: [id number] Date of Birth/Sex: 1931-01-24 (85 y.o. M) Treating RN: Cornell Barman Primary Care Minka Knight: Lelon Huh Other Clinician: Referring Zakyla Tonche: Lelon Huh Treating Auburn Hert/Extender: Melburn Hake, HOYT Weeks in Treatment: 3 Encounter Discharge Information  Items Post Procedure Vitals Discharge Condition: Stable Temperature (F): 98.1 Ambulatory Status: Ambulatory Pulse (bpm): 82 Discharge Destination: Home Respiratory Rate (breaths/min): 16 Transportation: Private Auto Blood Pressure (mmHg): 170/68 Accompanied By: self Schedule Follow-up Appointment: Yes Clinical Summary of Care: Electronic Signature(s) Signed: 07/09/2018 4:58:22 PM By: Gretta Cool, BSN, RN, CWS, Kim RN, BSN Entered By: Gretta Cool, BSN, RN, CWS, Kim on 07/09/2018 11:07:29 [name] ([id number]) -------------------------------------------------------------------------------- Lower Extremity Assessment Details Patient Name: [name] Date of Service: 07/09/2018 8:30 AM Medical Record Number: [id number] Patient Account Number: [id number] Date of Birth/Sex: Jul 24, 1930 (85 y.o. M) Treating RN: Army Melia Primary Care Lyna Laningham: Lelon Huh Other Clinician: Referring Dallen Bunte: Lelon Huh Treating Mariaha Ellington/Extender: Melburn Hake, HOYT Weeks in Treatment: 3 Edema Assessment Assessed: [Left: No] [Right: No] Edema: [Left: N] [Right: o] Calf Left: Right: Point of Measurement: 32 cm From Medial Instep 37 cm cm Ankle Left: Right: Point of Measurement: 12 cm From Medial Instep 26 cm cm Vascular Assessment Pulses: Dorsalis Pedis Palpable: [Left:Yes] Posterior Tibial Extremity colors, hair growth, and conditions: Extremity Color: [Left:Hyperpigmented] Hair Growth on Extremity: [Left:No] Temperature of Extremity: [Left:Warm] Capillary Refill: [Left:< 3 seconds] Toe Nail Assessment Left: Right: Thick: Yes Discolored: Yes Deformed: Yes Improper Length and Hygiene: No Electronic Signature(s) Signed: 07/09/2018 3:17:07 PM By: Army Melia Entered By: Army Melia on 07/09/2018 08:43:18 [name] ([id number]) -------------------------------------------------------------------------------- Multi Wound Chart Details Patient Name: [name] Date of Service: 07/09/2018 8:30 AM Medical Record Number: [id number] Patient Account Number: [id number] Date of Birth/Sex: 05-12-1930 (85 y.o. 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] [N/A:N/A] Comorbid History: [4:Glaucoma, Congestive Heart Failure, Hypertension,  Type II Diabetes, Gout, Osteoarthritis, Neuropathy] [N/A:N/A] Date Acquired: [4:06/05/2018] [N/A:N/A] Weeks of Treatment: [4:3] [N/A:N/A] Wound Status: [4:Open] [N/A:N/A] Measurements L x W x D [4:2.5x0.5x0.1] [N/A:N/A] (cm) Area (cm) : [4:0.982] [N/A:N/A] Volume (cm) : [4:0.098] [N/A:N/A] % Reduction in Area: [4:68.70%] [N/A:N/A] % Reduction in Volume: [4:68.80%] [N/A:N/A] Classification: [4:Grade 2] [N/A:N/A] Exudate Amount: [4:Medium] [N/A:N/A] Exudate Type: [4:Serous] [N/A:N/A] Exudate Color: [4:amber] [N/A:N/A] Wound Margin: [4:Flat and Intact] [N/A:N/A] Granulation Amount: [4:Small (1-33%)] [N/A:N/A] Granulation Quality: [4:Pink] [N/A:N/A] Necrotic Amount: [4:Large (67-100%)] [N/A:N/A] Exposed Structures: [4:Fat Layer (Subcutaneous Tissue) Exposed: Yes Fascia: No Tendon: No Muscle: No Joint: No Bone: No] [N/A:N/A] Epithelialization: [4:None] [N/A:N/A] Wound Preparation: [4:Ulcer Cleansing: Rinsed/Irrigated with Saline] [N/A:N/A] Topical Anesthetic Applied: Other: lidocaine 4% Treatment Notes Electronic Signature(s) Signed: 07/09/2018 4:27:08 PM By: Montey Hora Entered By: Montey Hora on  07/09/2018 09:03:16 [name] ([id number]) -------------------------------------------------------------------------------- [place] Details Patient Name: [name] Date of Service: 07/09/2018 8:30 AM Medical Record Number: [id number] Patient Account Number: [id number] Date of Birth/Sex: 13-Aug-1930 (85 y.o. M) Treating RN: Montey Hora Primary Care Laquita Harlan: Lelon Huh Other Clinician: Referring Towana Stenglein: Lelon Huh Treating Taishawn Smaldone/Extender: Melburn Hake, HOYT Weeks in Treatment: 3 Active Inactive Electronic Signature(s) Signed: 10/20/2018 11:30:30 AM By: Gretta Cool, BSN, RN, CWS, Kim RN, BSN Signed: 12/31/2018 1:05:02 PM By: Montey Hora Previous Signature: 07/09/2018 4:27:08 PM Version By: Montey Hora Entered By: Gretta Cool BSN, RN, CWS, Kim on 10/20/2018 11:30:29 [name] ([id number]) -------------------------------------------------------------------------------- Pain Assessment Details Patient Name: [name] Date of Service: 07/09/2018 8:30 AM Medical Record Number: [id number] Patient Account Number: [id number] Date of Birth/Sex: [birth year] (85 y.o. M) Treating RN: Montey Hora Primary Care Danel Requena: Lelon Huh Other Clinician: Referring Mariaeduarda Defranco: Lelon Huh Treating Toini Failla/Extender: Melburn Hake, HOYT Weeks in Treatment: 3 Active Problems Location of Pain Severity and Description of Pain Patient Has Paino No Site Locations Pain Management and Medication Current Pain Management: Electronic Signature(s) Signed: 07/09/2018 3:08:28 PM By: Lorine Bears RCP, RRT, CHT Signed: 07/09/2018 4:27:08 PM By: Montey Hora Entered By: Lorine Bears on 07/09/2018 08:36:39 [name] ([id number]) -------------------------------------------------------------------------------- Patient/Caregiver Education Details Patient Name: [name] Date of Service: 07/09/2018 8:30  AM Medical Record Number: [id number] Patient Account Number: [id number] Date of Birth/Gender: [birth year] (85 y.o. M) Treating RN: Montey Hora Primary Care Physician: Lelon Huh Other Clinician: Referring Physician: Lelon Huh Treating Physician/Extender: Sharalyn Ink in Treatment: 3 Education Assessment Education Provided To: Caregiver SNF nurses via written orders Education Topics Provided Wound/Skin Impairment: Handouts: Other: signed wound care orders Methods: Printed Electronic Signature(s) Signed: 07/09/2018 4:27:08 PM By: Montey Hora Entered By: Montey Hora on 07/09/2018 09:07:59 [name] ([id number]) -------------------------------------------------------------------------------- Wound Assessment Details Patient Name: [name] Date of Service: 07/09/2018 8:30 AM Medical Record Number: [id number] Patient Account Number: [id number] Date of Birth/Sex: August 08, 1930 (85 y.o. M) Treating RN: Army Melia Primary Care Ski Polich: Lelon Huh Other Clinician: Referring Cynara Tatham: Lelon Huh Treating Mele Sylvester/Extender: Melburn Hake, HOYT Weeks in Treatment: 3 Wound Status Wound Number: 4 Primary Diabetic Wound/Ulcer of the Lower Extremity Etiology: Wound Location: Left Foot - Lateral Wound Open Wounding Event: Gradually Appeared Status: Date Acquired: 06/05/2018 Comorbid Glaucoma, Congestive Heart Failure, Weeks Of Treatment: 3 History: Hypertension, Type II Diabetes, Gout, Clustered Wound: No Osteoarthritis, Neuropathy Photos Photo Uploaded By: Army Melia on 07/09/2018 09:46:41 Wound Measurements Length: (cm) 2.5 Width: (cm) 0.5 Depth: (cm) 0.1 Area: (cm) 0.982 Volume: (cm) 0.098 % Reduction in Area: 68.7% % Reduction in Volume: 68.8% Epithelialization: None Tunneling: No Undermining: No  Wound Description Classification: Grade 2 Foul Odo Wound Margin: Flat and Intact Slough/F Exudate Amount: Medium Exudate Type:  Serous Exudate Color: amber r After Cleansing: No ibrino Yes Wound Bed Granulation Amount: Small (1-33%) Exposed Structure Granulation Quality: Pink Fascia Exposed: No Necrotic Amount: Large (67-100%) Fat Layer (Subcutaneous Tissue) Exposed: Yes Necrotic Quality: Adherent Slough Tendon Exposed: No Muscle Exposed: No Joint Exposed: No Bone Exposed: No Periwound Skin Texture GUST, EUGENE. ([id number]) Texture Color No Abnormalities Noted: No No Abnormalities Noted: No Callus: Yes Atrophie Blanche: No Crepitus: No Cyanosis: No Excoriation: No Ecchymosis: No Induration: No Erythema: No Rash: No Hemosiderin Staining: No Scarring: No Mottled: No Pallor: No Moisture Rubor: No No Abnormalities Noted: No Dry / Scaly: Yes Temperature / Pain Maceration: No Tenderness on Palpation: Yes Wound Preparation Ulcer Cleansing: Rinsed/Irrigated with Saline Topical Anesthetic Applied: Other: lidocaine 4%, Electronic Signature(s) Signed: 07/09/2018 3:17:07 PM By: Army Melia Entered By: Army Melia on 07/09/2018 08:42:12 [name] ([id number]) -------------------------------------------------------------------------------- Vitals Details Patient Name: [name] Date of Service: 07/09/2018 8:30 AM Medical Record Number: [id number] Patient Account Number: [id number] Date of Birth/Sex: 07-26-1930 (85 y.o. M) Treating RN: Montey Hora Primary Care Lindel Marcell: Lelon Huh Other Clinician: Referring Danylle Ouk: Lelon Huh Treating Jessly Lebeck/Extender: Melburn Hake, HOYT Weeks in Treatment: 3 Vital Signs Time Taken: 08:36 Temperature (F): 98.1 Height (in): 68 Pulse (bpm): 82 Weight (lbs): 218 Respiratory Rate (breaths/min): 18 Body Mass Index (BMI): 33.1 Blood Pressure (mmHg): 170/63 Reference Range: 80 - 120 mg / dl Electronic Signature(s) Signed: 07/09/2018 3:08:28 PM By: Lorine Bears RCP, RRT, CHT Entered By: Lorine Bears on 07/09/2018 08:40:32

## 2018-07-16 DIAGNOSIS — D649 Anemia, unspecified: Secondary | ICD-10-CM | POA: Diagnosis not present

## 2018-07-16 DIAGNOSIS — E1122 Type 2 diabetes mellitus with diabetic chronic kidney disease: Secondary | ICD-10-CM | POA: Diagnosis not present

## 2018-07-16 DIAGNOSIS — N401 Enlarged prostate with lower urinary tract symptoms: Secondary | ICD-10-CM | POA: Diagnosis not present

## 2018-07-16 DIAGNOSIS — N319 Neuromuscular dysfunction of bladder, unspecified: Secondary | ICD-10-CM | POA: Diagnosis not present

## 2018-07-16 DIAGNOSIS — E119 Type 2 diabetes mellitus without complications: Secondary | ICD-10-CM | POA: Diagnosis not present

## 2018-07-16 DIAGNOSIS — I110 Hypertensive heart disease with heart failure: Secondary | ICD-10-CM | POA: Diagnosis not present

## 2018-07-23 ENCOUNTER — Ambulatory Visit: Payer: Medicare Other | Admitting: Physician Assistant

## 2018-07-30 DIAGNOSIS — E1122 Type 2 diabetes mellitus with diabetic chronic kidney disease: Secondary | ICD-10-CM | POA: Diagnosis not present

## 2018-07-30 DIAGNOSIS — N401 Enlarged prostate with lower urinary tract symptoms: Secondary | ICD-10-CM | POA: Diagnosis not present

## 2018-07-30 DIAGNOSIS — N319 Neuromuscular dysfunction of bladder, unspecified: Secondary | ICD-10-CM | POA: Diagnosis not present

## 2018-08-12 DIAGNOSIS — D649 Anemia, unspecified: Secondary | ICD-10-CM | POA: Diagnosis not present

## 2018-08-12 DIAGNOSIS — Z79899 Other long term (current) drug therapy: Secondary | ICD-10-CM | POA: Diagnosis not present

## 2018-08-12 DIAGNOSIS — I10 Essential (primary) hypertension: Secondary | ICD-10-CM | POA: Diagnosis not present

## 2018-08-12 DIAGNOSIS — E119 Type 2 diabetes mellitus without complications: Secondary | ICD-10-CM | POA: Diagnosis not present

## 2018-08-26 ENCOUNTER — Ambulatory Visit (INDEPENDENT_AMBULATORY_CARE_PROVIDER_SITE_OTHER): Payer: Medicare Other | Admitting: Vascular Surgery

## 2018-09-30 ENCOUNTER — Ambulatory Visit (INDEPENDENT_AMBULATORY_CARE_PROVIDER_SITE_OTHER): Payer: Medicare Other | Admitting: Vascular Surgery

## 2018-10-27 ENCOUNTER — Telehealth: Payer: Self-pay | Admitting: Family Medicine

## 2018-10-27 NOTE — Chronic Care Management (AMB) (Signed)
°  Chronic Care Management   Outreach Note  10/27/2018 Name: Miguel Torres MRN: [id number] DOB: [birth year]  Referred by: [name], MD Reason for referral : No chief complaint on file.   Third unsuccessful telephone outreach was attempted today. The patient was referred to the case management team for assistance with chronic care management and care coordination. The patient's primary care provider has been notified of our unsuccessful attempts to make or maintain contact with the patient. The care management team is pleased to engage with this patient at any time in the future should he/she be interested in assistance from the care management team.   Follow Up Plan: The care management team is available to follow up with the patient after provider conversation with the patient regarding recommendation for care management engagement and subsequent re-referral to the care management team.   [place]  ??[email or web address]   ??[phone]

## 2018-10-28 ENCOUNTER — Ambulatory Visit (INDEPENDENT_AMBULATORY_CARE_PROVIDER_SITE_OTHER): Payer: No Typology Code available for payment source | Admitting: Vascular Surgery

## 2018-11-05 DIAGNOSIS — Z20828 Contact with and (suspected) exposure to other viral communicable diseases: Secondary | ICD-10-CM | POA: Diagnosis not present

## 2018-11-10 DIAGNOSIS — E785 Hyperlipidemia, unspecified: Secondary | ICD-10-CM | POA: Diagnosis not present

## 2019-02-14 DIAGNOSIS — C44329 Squamous cell carcinoma of skin of other parts of face: Secondary | ICD-10-CM | POA: Diagnosis not present

## 2019-02-14 DIAGNOSIS — C4442 Squamous cell carcinoma of skin of scalp and neck: Secondary | ICD-10-CM | POA: Diagnosis not present

## 2019-02-14 DIAGNOSIS — C44619 Basal cell carcinoma of skin of left upper limb, including shoulder: Secondary | ICD-10-CM | POA: Diagnosis not present

## 2019-03-10 ENCOUNTER — Telehealth (INDEPENDENT_AMBULATORY_CARE_PROVIDER_SITE_OTHER): Payer: Self-pay | Admitting: Vascular Surgery

## 2019-03-10 ENCOUNTER — Other Ambulatory Visit (INDEPENDENT_AMBULATORY_CARE_PROVIDER_SITE_OTHER): Payer: Self-pay | Admitting: Nurse Practitioner

## 2019-03-10 DIAGNOSIS — L819 Disorder of pigmentation, unspecified: Secondary | ICD-10-CM

## 2019-03-10 DIAGNOSIS — R209 Unspecified disturbances of skin sensation: Secondary | ICD-10-CM

## 2019-03-10 NOTE — Telephone Encounter (Signed)
Let's see if we can get him in with an abi and L art duplex..either me or schnier schedule

## 2019-03-11 ENCOUNTER — Other Ambulatory Visit: Payer: Self-pay

## 2019-03-11 ENCOUNTER — Ambulatory Visit (INDEPENDENT_AMBULATORY_CARE_PROVIDER_SITE_OTHER): Payer: Medicare (Managed Care)

## 2019-03-11 ENCOUNTER — Ambulatory Visit (INDEPENDENT_AMBULATORY_CARE_PROVIDER_SITE_OTHER): Payer: Medicare (Managed Care) | Admitting: Nurse Practitioner

## 2019-03-11 ENCOUNTER — Encounter (INDEPENDENT_AMBULATORY_CARE_PROVIDER_SITE_OTHER): Payer: Self-pay | Admitting: Nurse Practitioner

## 2019-03-11 VITALS — BP 174/83 | HR 82 | Resp 16 | Wt 218.0 lb

## 2019-03-11 DIAGNOSIS — R209 Unspecified disturbances of skin sensation: Secondary | ICD-10-CM | POA: Diagnosis not present

## 2019-03-11 DIAGNOSIS — I739 Peripheral vascular disease, unspecified: Secondary | ICD-10-CM

## 2019-03-11 DIAGNOSIS — L819 Disorder of pigmentation, unspecified: Secondary | ICD-10-CM | POA: Diagnosis not present

## 2019-03-11 DIAGNOSIS — I10 Essential (primary) hypertension: Secondary | ICD-10-CM

## 2019-03-11 DIAGNOSIS — I890 Lymphedema, not elsewhere classified: Secondary | ICD-10-CM

## 2019-03-14 ENCOUNTER — Encounter (INDEPENDENT_AMBULATORY_CARE_PROVIDER_SITE_OTHER): Payer: Self-pay | Admitting: Nurse Practitioner

## 2019-03-14 DIAGNOSIS — I7025 Atherosclerosis of native arteries of other extremities with ulceration: Secondary | ICD-10-CM | POA: Insufficient documentation

## 2019-03-14 DIAGNOSIS — I739 Peripheral vascular disease, unspecified: Secondary | ICD-10-CM | POA: Insufficient documentation

## 2019-03-14 NOTE — Progress Notes (Signed)
SUBJECTIVE:  Patient ID: Miguel Torres, male    DOB: 1931/03/29, 85 y.o.   MRN: [id number] Chief Complaint  Patient presents with  . Follow-up    ultrasound follow up    HPI  Miguel Torres is a 85 y.o. male that presents today after an urgent call from his nursing facility with concern to swelling and discoloration of his left lower extremity.  The patient has a previous below-knee amputation.  There was also the concern that the patient's left lower extremity was cold.  Today, the patient's lower extremity is very edematous however color changes are more consistent with stasis dermatitis.  The left foot is cool but not cold to the touch.  Patient denies any pain in his extremities however states that the swelling is bothersome to him.  He denies any fever, chills, nausea, vomiting or diarrhea.  He denies any rest pain like symptoms.  The patient does endorse that he does not walk greatly.  The patient underwent noninvasive studies today.  The patient had a noncompressible ABI on the left lower extremity.  He had monophasic waveforms in his anterior tibial artery with biphasic in his posterior tibial artery.  There are dampened toe waveforms.  Past Medical History:  Diagnosis Date  . Anemia   . CHF (congestive heart failure) ([place])   . Diabetes mellitus without complication ([place])   . Hyperlipidemia   . Hypertension   . Skin cancer     Past Surgical History:  Procedure Laterality Date  . BASAL CELL CARCINOMA EXCISION    . History of eye surgery Bilateral   . IR CATHETER TUBE CHANGE  03/01/2018  . MOHS SURGERY  04/2011   the top of his head  . Right BKA  09/12/2010   Dr. Delana Meyer; Secondary gangrenous right LE  . TONSILLECTOMY      Social History   Socioeconomic History  . Marital status: Married    Spouse name: Not on file  . Number of children: 5  . Years of education: 5th grade  . Highest education level: Not on file  Occupational History  . Occupation: Retired   Scientific laboratory technician  . Financial resource strain: Not on file  . Food insecurity    Worry: Not on file    Inability: Not on file  . Transportation needs    Medical: Not on file    Non-medical: Not on file  Tobacco Use  . Smoking status: Never Smoker  . Smokeless tobacco: Never Used  Substance and Sexual Activity  . Alcohol use: No  . Drug use: No  . Sexual activity: Not Currently  Lifestyle  . Physical activity    Days per week: Not on file    Minutes per session: Not on file  . Stress: Not on file  Relationships  . Social Herbalist on phone: Not on file    Gets together: Not on file    Attends religious service: Not on file    Active member of club or organization: Not on file    Attends meetings of clubs or organizations: Not on file    Relationship status: Not on file  . Intimate partner violence    Fear of current or ex partner: Not on file    Emotionally abused: Not on file    Physically abused: Not on file    Forced sexual activity: Not on file  Other Topics Concern  . Not on file  Social History Narrative  Lives at home by himself.  Ambulates with a walker   Has a  right prosthetic leg    Family History  Problem Relation Age of Onset  . Diabetes Mother   . Arthritis Sister   . Diabetes Daughter     No Known Allergies   Review of Systems   Review of Systems: Negative Unless Checked Constitutional: [] Weight loss  [] Fever  [] Chills Cardiac: [] Chest pain   []  Atrial Fibrillation  [] Palpitations   [] Shortness of breath when laying flat   [] Shortness of breath with exertion. [] Shortness of breath at rest Vascular:  [] Pain in legs with walking   [] Pain in legs with standing [] Pain in legs when laying flat   [] Claudication    [] Pain in feet when laying flat    [] History of DVT   [] Phlebitis   [x] Swelling in legs   [] Varicose veins   [] Non-healing ulcers Pulmonary:   [] Uses home oxygen   [] Productive cough   [] Hemoptysis   [] Wheeze  [] COPD   [] Asthma  Neurologic:  [] Dizziness   [] Seizures  [] Blackouts [] History of stroke   [] History of TIA  [] Aphasia   [] Temporary Blindness   [] Weakness or numbness in arm   [x] Weakness or numbness in leg Musculoskeletal:   [] Joint swelling   [] Joint pain   [] Low back pain  []  History of Knee Replacement [] Arthritis [] back Surgeries  []  Spinal Stenosis    Hematologic:  [] Easy bruising  [] Easy bleeding   [] Hypercoagulable state   [x] Anemic Gastrointestinal:  [] Diarrhea   [] Vomiting  [] Gastroesophageal reflux/heartburn   [] Difficulty swallowing. [] Abdominal pain Genitourinary:  [x] Chronic kidney disease   [] Difficult urination  [] Anuric   [] Blood in urine [] Frequent urination  [] Burning with urination   [] Hematuria Skin:  [] Rashes   [] Ulcers [] Wounds Psychological:  [] History of anxiety   []  History of major depression  [x]  Memory Difficulties      OBJECTIVE:   Physical Exam  BP (!) 174/83 (BP Location: Left Arm)   Pulse 82   Resp 16   Wt 218 lb (98.9 kg)   BMI 32.19 kg/m   Gen: WD/WN, NAD Head: [place]/AT, No temporalis wasting.  Ear/Nose/Throat: Hearing grossly intact, nares w/o erythema or drainage Eyes: PER, EOMI, sclera nonicteric.  Neck: Supple, no masses.  No JVD.  Pulmonary:  Good air movement, no use of accessory muscles.  Cardiac: RRR Vascular:  3+ pitting edema of left lower extremity, unable to palpate pulses due to edema.  Delayed capillary refill, cool to the touch Vessel Right Left  Radial Palpable Palpable   Gastrointestinal: soft, non-distended. No guarding/no peritoneal signs.  Musculoskeletal: M/S 5/5 throughout.    Right below-knee amputation Neurologic: Pain and light touch intact in extremities.  Symmetrical.  Speech is fluent. Motor exam as listed above. Psychiatric: Judgment intact, Mood & affect appropriate for pt's clinical situation. Dermatologic: No Venous rashes. No Ulcers Noted.  No changes consistent with cellulitis. Lymph : No Cervical lymphadenopathy, no  lichenification or skin changes of chronic lymphedema.       ASSESSMENT AND PLAN:  1. Peripheral arterial disease ([place]) I had a discussion with the patient regarding his peripheral arterial disease.  I discussed with the patient that he had dampened toe waveforms which indicated that he likely had reduced perfusion to his feet however at this time there are no open wounds or sores therefore proceeding with angiogram is not absolutely necessary.  I discussed the procedure of an angiogram with the patient and offered as an option however at this  time he does not wish to proceed.  We will have the patient return in 3 months with follow-up noninvasive studies.  The nursing facility was also sent instructions to contact us should he have discoloration of his toes, new wounds or complaints of pain for the patient.  2. Essential hypertension Continue antihypertensive medications as already ordered, these medications have been reviewed and there are no changes at this time.   3. Lymphedema We will have the patient placed in [place] wraps today to be changed at the patient's facility.  Specific instructions were sent to the facility with a wraps to be left in place for a week at a time to be changed on a weekly basis.  The wraps can also be changed as needed if they become wet.  Patient continue wraps for 4 weeks.   Current Outpatient Medications on File Prior to Visit  Medication Sig Dispense Refill  . acetaminophen (TYLENOL) 325 MG tablet Take 2 tablets (650 mg total) by mouth every 6 (six) hours as needed for mild pain (or Fever >/= 101).    [name] acetic acid 0.25 % irrigation Irrigate with as directed daily.    [name] apixaban (ELIQUIS) 2.5 MG TABS tablet Take 1 tablet (2.5 mg total) by mouth 2 (two) times daily. 60 tablet   . Cholecalciferol (VITAMIN D3) 50000 units CAPS Take by mouth once a week.    . felodipine (PLENDIL) 5 MG 24 hr tablet Take 1 tablet (5 mg total) by mouth daily.    . ferrous sulfate 325  (65 FE) MG tablet Take 325 mg by mouth daily.     . fexofenadine (ALLEGRA) 60 MG tablet Take 60 mg by mouth as needed for allergies or rhinitis.    . furosemide (LASIX) 20 MG tablet Take 1 tablet (20 mg total) by mouth 2 (two) times daily. 60 tablet 0  . insulin aspart (NOVOLOG) 100 UNIT/ML injection Inject 0-9 Units into the skin 3 (three) times daily with meals. 10 mL 11  . lovastatin (MEVACOR) 40 MG tablet TAKE 1 TABLET (40 MG TOTAL) BY MOUTH DAILY. (Patient taking differently: Take 40 mg by mouth daily. ) 90 tablet 3  . multivitamin-lutein (OCUVITE-LUTEIN) CAPS capsule Take 1 capsule by mouth daily. 30 capsule 0  . Vitamin D, Ergocalciferol, (DRISDOL) 50000 units CAPS capsule TAKE ONE CAPSULE BY MOUTH EVERY 7 DAYS (Patient taking differently: Take 50,000 Units by mouth every Saturday. ) 4 capsule 0  . ascorbic acid (VITAMIN C) 250 MG tablet Take 250 mg by mouth daily.    [name] atenolol-chlorthalidone (TENORETIC) 50-25 MG tablet     . glipiZIDE (GLUCOTROL) 10 MG tablet TAKE 1 TABLET (10 MG TOTAL) BY MOUTH DAILY. (Patient not taking: No sig reported) 90 tablet 4  . LEVEMIR FLEXTOUCH 100 UNIT/ML Pen 28 Units at bedtime.     [name] loratadine (CLARITIN) 10 MG tablet Take 10 mg by mouth daily.    . megestrol (MEGACE) 400 MG/10ML suspension Take 10 mLs (400 mg total) by mouth 2 (two) times daily. (Patient not taking: Reported on 03/11/2019) 240 mL 0  . montelukast (SINGULAIR) 10 MG tablet TAKE 1 TABLET (10 MG TOTAL) BY MOUTH EVERY EVENING. (Patient not taking: No sig reported) 90 tablet 4  . Multiple Vitamin (MULTIVITAMIN) tablet Take 1 tablet by mouth daily.    [name] oxycodone (OXY-IR) 5 MG capsule Take 5 mg by mouth every 4 (four) hours as needed.    . pioglitazone (ACTOS) 30 MG tablet Take 1 tablet (30 mg  total) by mouth daily. (Patient not taking: Reported on 03/11/2019) 90 tablet 3  . polyethylene glycol (MIRALAX / GLYCOLAX) packet Take 17 g by mouth daily as needed for mild constipation.     . tamsulosin  (FLOMAX) 0.4 MG CAPS capsule Take 0.4 mg by mouth.     No current facility-administered medications on file prior to visit.     There are no Patient Instructions on file for this visit. No follow-ups on file.   Kris Hartmann, NP  This note was completed with Sales executive.  Any errors are purely unintentional.

## 2019-05-18 DIAGNOSIS — Z89511 Acquired absence of right leg below knee: Secondary | ICD-10-CM | POA: Insufficient documentation

## 2019-06-13 ENCOUNTER — Encounter (INDEPENDENT_AMBULATORY_CARE_PROVIDER_SITE_OTHER): Payer: Medicare (Managed Care)

## 2019-06-13 ENCOUNTER — Telehealth (INDEPENDENT_AMBULATORY_CARE_PROVIDER_SITE_OTHER): Payer: Self-pay

## 2019-06-13 ENCOUNTER — Ambulatory Visit (INDEPENDENT_AMBULATORY_CARE_PROVIDER_SITE_OTHER): Payer: Medicare (Managed Care) | Admitting: Vascular Surgery

## 2019-06-13 ENCOUNTER — Ambulatory Visit (INDEPENDENT_AMBULATORY_CARE_PROVIDER_SITE_OTHER): Payer: Medicare (Managed Care)

## 2019-06-13 ENCOUNTER — Encounter (INDEPENDENT_AMBULATORY_CARE_PROVIDER_SITE_OTHER): Payer: Self-pay | Admitting: Vascular Surgery

## 2019-06-13 ENCOUNTER — Other Ambulatory Visit: Payer: Self-pay

## 2019-06-13 VITALS — BP 155/79 | HR 98 | Resp 16 | Ht 68.0 in | Wt 215.0 lb

## 2019-06-13 DIAGNOSIS — L97929 Non-pressure chronic ulcer of unspecified part of left lower leg with unspecified severity: Secondary | ICD-10-CM

## 2019-06-13 DIAGNOSIS — I10 Essential (primary) hypertension: Secondary | ICD-10-CM | POA: Diagnosis not present

## 2019-06-13 DIAGNOSIS — I739 Peripheral vascular disease, unspecified: Secondary | ICD-10-CM

## 2019-06-13 DIAGNOSIS — I7025 Atherosclerosis of native arteries of other extremities with ulceration: Secondary | ICD-10-CM

## 2019-06-13 DIAGNOSIS — I5031 Acute diastolic (congestive) heart failure: Secondary | ICD-10-CM

## 2019-06-13 DIAGNOSIS — I872 Venous insufficiency (chronic) (peripheral): Secondary | ICD-10-CM

## 2019-06-13 DIAGNOSIS — E785 Hyperlipidemia, unspecified: Secondary | ICD-10-CM

## 2019-06-13 DIAGNOSIS — I4892 Unspecified atrial flutter: Secondary | ICD-10-CM

## 2019-06-13 NOTE — Progress Notes (Signed)
MRN : [id number]  Miguel Torres is a 86 y.o. (09-24-1930) male who presents with chief complaint of No chief complaint on file. [name]  History of Present Illness:  The patient is seen for evaluation of painful lower extremities and diminished pulses associated with ulceration of the left foot.  The patient notes the ulcer has been present for several weeks and has not been improving.  It is not painful and has had some drainage.  No specific history of trauma noted by the patient.  The patient denies fever or chills.  the patient does have diabetes which has been difficult to control.  It is associated with severe edema.  The patient denies rest pain or dangling of an extremity off the side of the bed during the night for relief. No prior interventions or surgeries.  No history of back problems or DJD of the lumbar sacral spine.   The patient denies amaurosis fugax or recent TIA symptoms. There are no recent neurological changes noted. The patient denies history of DVT, PE or superficial thrombophlebitis. The patient denies recent episodes of angina or shortness of breath.   The patient underwent noninvasive studies in November 2020.  The patient had a noncompressible ABI on the left lower extremity.  He had monophasic waveforms in his anterior tibial artery with biphasic in his posterior tibial artery.  There are dampened toe waveforms.  Duplex ultrasound of the left lower extremity obtained today demonstrates transition in the SFA to monophasic flow monophasic flow is present in all tibials as well.  No outpatient medications have been marked as taking for the 06/13/19 encounter (Appointment) with Delana Meyer, Dolores Lory, MD.    Past Medical History:  Diagnosis Date  . Anemia   . CHF (congestive heart failure) ([place])   . Diabetes mellitus without complication ([place])   . Hyperlipidemia   . Hypertension   . Skin cancer     Past Surgical History:  Procedure Laterality Date  . BASAL CELL  CARCINOMA EXCISION    . History of eye surgery Bilateral   . IR CATHETER TUBE CHANGE  03/01/2018  . MOHS SURGERY  04/2011   the top of his head  . Right BKA  09/12/2010   Dr. Delana Meyer; Secondary gangrenous right LE  . TONSILLECTOMY      Social History Social History   Tobacco Use  . Smoking status: Never Smoker  . Smokeless tobacco: Never Used  Substance Use Topics  . Alcohol use: No  . Drug use: No    Family History Family History  Problem Relation Age of Onset  . Diabetes Mother   . Arthritis Sister   . Diabetes Daughter     No Known Allergies   REVIEW OF SYSTEMS (Negative unless checked)  Constitutional: [] Weight loss  [] Fever  [] Chills Cardiac: [] Chest pain   [] Chest pressure   [] Palpitations   [] Shortness of breath when laying flat   [] Shortness of breath with exertion. Vascular:  [] Pain in legs with walking   [] Pain in legs at rest  [] History of DVT   [] Phlebitis   [x] Swelling in legs   [] Varicose veins   [x] Non-healing ulcers Pulmonary:   [] Uses home oxygen   [] Productive cough   [] Hemoptysis   [] Wheeze  [] COPD   [] Asthma Neurologic:  [] Dizziness   [] Seizures   [] History of stroke   [] History of TIA  [] Aphasia   [] Vissual changes   [] Weakness or numbness in arm   [] Weakness or numbness in leg Musculoskeletal:   []   Joint swelling   [] Joint pain   [] Low back pain Hematologic:  [] Easy bruising  [] Easy bleeding   [] Hypercoagulable state   [] Anemic Gastrointestinal:  [] Diarrhea   [] Vomiting  [] Gastroesophageal reflux/heartburn   [] Difficulty swallowing. Genitourinary:  [] Chronic kidney disease   [] Difficult urination  [] Frequent urination   [] Blood in urine Skin:  [] Rashes   [] Ulcers  Psychological:  [] History of anxiety   []  History of major depression.  Physical Examination  There were no vitals filed for this visit. There is no height or weight on file to calculate BMI. Gen: WD/WN, NAD he is seen in a wheelchair urinary catheter is present Head: [place]/AT, No  temporalis wasting.  Ear/Nose/Throat: Hearing grossly intact, nares w/o erythema or drainage Eyes: PER, EOMI, sclera nonicteric.  Neck: Supple, no large masses.   Pulmonary:  Good air movement, no audible wheezing bilaterally, no use of accessory muscles.  Cardiac: RRR, no JVD Vascular: Left lower extremity demonstrates 3-4+ pitting edema. There is diffuse superficial ulceration of the shin area. There is small amount of drainage. There does not appear to be changes consistent with cellulitis. Vessel Right Left  PT BKA Not Palpable  DP BKA Not Palpable  Gastrointestinal: Non-distended. No guarding/no peritoneal signs.  Musculoskeletal: M/S 5/5 throughout.  No deformity or atrophy.  Neurologic: CN 2-12 intact. Symmetrical.  Speech is fluent. Motor exam as listed above. Psychiatric: Judgment intact, Mood & affect appropriate for pt's clinical situation. Dermatologic: Moderate venous rashes diffuse ulcers noted left lower extremity.  No changes consistent with cellulitis.   CBC Lab Results  Component Value Date   WBC 7.5 01/25/2018   HGB 10.1 (L) 01/25/2018   HCT 29.6 (L) 01/25/2018   MCV 93.8 01/25/2018   PLT 214 01/25/2018    BMET    Component Value Date/Time   NA 133 (L) 12/21/2017 1238   NA 138 10/15/2016 1611   NA 140 09/30/2012 1609   K 4.1 12/21/2017 1238   K 4.5 09/30/2012 1609   CL 97 (L) 12/21/2017 1238   CL 111 (H) 09/30/2012 1609   CO2 25 12/21/2017 1238   CO2 24 09/30/2012 1609   GLUCOSE 180 (H) 12/21/2017 1238   GLUCOSE 90 09/30/2012 1609   BUN 80 (H) 12/21/2017 1238   BUN 30 (H) 10/15/2016 1611   BUN 28 (H) 09/30/2012 1609   CREATININE 1.79 (H) 12/21/2017 1238   CREATININE 1.33 (H) 09/30/2012 1609   CALCIUM 8.5 (L) 12/21/2017 1238   CALCIUM 8.5 09/30/2012 1609   GFRNONAA 33 (L) 12/21/2017 1238   GFRNONAA 50 (L) 09/30/2012 1609   GFRAA 38 (L) 12/21/2017 1238   GFRAA 58 (L) 09/30/2012 1609   CrCl cannot be calculated (Patient's most recent lab result is  older than the maximum 21 days allowed.).  COAG Lab Results  Component Value Date   INR 1.20 01/25/2018    Radiology No results found.   Assessment/Plan 1. Atherosclerosis of native arteries of the extremities with ulceration ([place])  Recommend:  The patient has evidence of severe atherosclerotic changes of both lower extremities associated with ulceration and tissue loss of the left foot.  This represents a limb threatening ischemia and places the patient at the risk for left  limb loss.  Patient should undergo angiography of the left lower extremity with the hope for intervention for limb salvage.  The risks and benefits as well as the alternative therapies was discussed in detail with the patient.  All questions were answered.  Patient agrees to proceed with  left leg angiography.  Patient has already lost his right lower extremity and has a below-knee amputation. He states he wants everything done to protect his left lower extremity from being amputated as well.  The patient will follow up with me in the office after the procedure.    2. Venous ulcer of left lower extremity without varicose veins ([facility]) No surgery or intervention at this point in time.    I have had a long discussion with the patient regarding venous insufficiency and why it  causes symptoms, specifically venous ulceration . I have discussed with the patient the chronic skin changes that accompany venous insufficiency and the long term sequela such as infection and recurring  ulceration.  Patient will be placed in [employer] which will be changed weekly drainage permitting.  In addition, behavioral modification including several periods of elevation of the lower extremities during the day will be continued. Achieving a position with the ankles at heart level was stressed to the patient  The patient is instructed to begin routine exercise, especially walking on a daily basis  Patient should undergo duplex ultrasound  of the venous system to ensure that DVT or reflux is not present.  Following the review of the ultrasound the patient will follow up in one week to reassess the degree of swelling and the control that [place] therapy is offering.   The patient can be assessed for graduated compression stockings or wraps as well as a Lymph Pump once the ulcers are healed.   3. Essential hypertension Continue antihypertensive medications as already ordered, these medications have been reviewed and there are no changes at this time.   4. Atrial flutter, unspecified type ([place]) Continue antiarrhythmia medications as already ordered, these medications have been reviewed and there are no changes at this time.  Continue anticoagulation as ordered by Cardiology Service   5. Hyperlipidemia, unspecified hyperlipidemia type Continue statin as ordered and reviewed, no changes at this time   6. Acute diastolic congestive heart failure (HCC) Continue cardiac and antihypertensive medications as already ordered and reviewed, no changes at this time.  Continue statin as ordered and reviewed, no changes at this time  Nitrates PRN for chest pain     Hortencia Pilar, MD  06/13/2019 9:21 AM

## 2019-06-13 NOTE — Telephone Encounter (Signed)
Spoke with Crystal at [facility] and the patient is scheduled with Dr. Delana Meyer on 06/21/19 with a 9:00 am arrival time to the [facility]. Patient will do covid testing on 06/17/19 between 12:30-2:30 pm at the [facility]. Pre-procedure instructions will be faxed to [place] attention [place].

## 2019-06-17 ENCOUNTER — Other Ambulatory Visit: Admission: RE | Admit: 2019-06-17 | Payer: Medicare (Managed Care) | Source: Ambulatory Visit

## 2019-06-20 ENCOUNTER — Other Ambulatory Visit (INDEPENDENT_AMBULATORY_CARE_PROVIDER_SITE_OTHER): Payer: Self-pay | Admitting: Nurse Practitioner

## 2019-06-20 ENCOUNTER — Other Ambulatory Visit: Payer: Self-pay

## 2019-06-20 ENCOUNTER — Other Ambulatory Visit
Admission: RE | Admit: 2019-06-20 | Discharge: 2019-06-20 | Disposition: A | Discharge status: Home / Self Care | Payer: Medicare (Managed Care) | Source: Ambulatory Visit | Attending: Vascular Surgery | Admitting: Vascular Surgery

## 2019-06-20 DIAGNOSIS — Z20822 Contact with and (suspected) exposure to covid-19: Secondary | ICD-10-CM | POA: Diagnosis not present

## 2019-06-20 DIAGNOSIS — Z01812 Encounter for preprocedural laboratory examination: Secondary | ICD-10-CM | POA: Diagnosis present

## 2019-06-21 ENCOUNTER — Inpatient Hospital Stay
Admission: AD | Admit: 2019-06-21 | Discharge: 2019-06-21 | Disposition: A | Discharge status: Home / Self Care | Payer: Medicare (Managed Care) | Source: Home / Self Care | Attending: Internal Medicine | Admitting: Internal Medicine

## 2019-06-21 ENCOUNTER — Ambulatory Visit: Payer: Medicare (Managed Care)

## 2019-06-21 ENCOUNTER — Encounter: Payer: Self-pay | Admitting: Vascular Surgery

## 2019-06-21 ENCOUNTER — Encounter
Admission: AD | Disposition: A | Discharge status: Skilled Nursing Facility | Payer: Self-pay | Source: Home / Self Care | Attending: Internal Medicine

## 2019-06-21 ENCOUNTER — Inpatient Hospital Stay
Admission: AD | Admit: 2019-06-21 | Discharge: 2019-07-01 | DRG: 291 | Disposition: A | Discharge status: Skilled Nursing Facility | Payer: Medicare (Managed Care) | Attending: Internal Medicine | Admitting: Internal Medicine

## 2019-06-21 DIAGNOSIS — Z89511 Acquired absence of right leg below knee: Secondary | ICD-10-CM | POA: Diagnosis not present

## 2019-06-21 DIAGNOSIS — D509 Iron deficiency anemia, unspecified: Secondary | ICD-10-CM | POA: Diagnosis present

## 2019-06-21 DIAGNOSIS — N1832 Chronic kidney disease, stage 3b: Secondary | ICD-10-CM

## 2019-06-21 DIAGNOSIS — E11649 Type 2 diabetes mellitus with hypoglycemia without coma: Secondary | ICD-10-CM | POA: Diagnosis not present

## 2019-06-21 DIAGNOSIS — I130 Hypertensive heart and chronic kidney disease with heart failure and stage 1 through stage 4 chronic kidney disease, or unspecified chronic kidney disease: Secondary | ICD-10-CM | POA: Diagnosis present

## 2019-06-21 DIAGNOSIS — E669 Obesity, unspecified: Secondary | ICD-10-CM | POA: Diagnosis present

## 2019-06-21 DIAGNOSIS — I4892 Unspecified atrial flutter: Secondary | ICD-10-CM | POA: Diagnosis present

## 2019-06-21 DIAGNOSIS — E871 Hypo-osmolality and hyponatremia: Secondary | ICD-10-CM | POA: Diagnosis not present

## 2019-06-21 DIAGNOSIS — Z20822 Contact with and (suspected) exposure to covid-19: Secondary | ICD-10-CM | POA: Diagnosis present

## 2019-06-21 DIAGNOSIS — L97909 Non-pressure chronic ulcer of unspecified part of unspecified lower leg with unspecified severity: Secondary | ICD-10-CM

## 2019-06-21 DIAGNOSIS — N179 Acute kidney failure, unspecified: Secondary | ICD-10-CM | POA: Diagnosis not present

## 2019-06-21 DIAGNOSIS — Z538 Procedure and treatment not carried out for other reasons: Secondary | ICD-10-CM | POA: Diagnosis not present

## 2019-06-21 DIAGNOSIS — Z7901 Long term (current) use of anticoagulants: Secondary | ICD-10-CM

## 2019-06-21 DIAGNOSIS — I7025 Atherosclerosis of native arteries of other extremities with ulceration: Secondary | ICD-10-CM | POA: Diagnosis present

## 2019-06-21 DIAGNOSIS — I5031 Acute diastolic (congestive) heart failure: Secondary | ICD-10-CM

## 2019-06-21 DIAGNOSIS — I70262 Atherosclerosis of native arteries of extremities with gangrene, left leg: Secondary | ICD-10-CM | POA: Diagnosis not present

## 2019-06-21 DIAGNOSIS — E785 Hyperlipidemia, unspecified: Secondary | ICD-10-CM | POA: Diagnosis present

## 2019-06-21 DIAGNOSIS — Z66 Do not resuscitate: Secondary | ICD-10-CM | POA: Diagnosis present

## 2019-06-21 DIAGNOSIS — I10 Essential (primary) hypertension: Secondary | ICD-10-CM | POA: Diagnosis present

## 2019-06-21 DIAGNOSIS — E1129 Type 2 diabetes mellitus with other diabetic kidney complication: Secondary | ICD-10-CM | POA: Diagnosis present

## 2019-06-21 DIAGNOSIS — E1152 Type 2 diabetes mellitus with diabetic peripheral angiopathy with gangrene: Secondary | ICD-10-CM | POA: Diagnosis present

## 2019-06-21 DIAGNOSIS — Z79899 Other long term (current) drug therapy: Secondary | ICD-10-CM | POA: Diagnosis not present

## 2019-06-21 DIAGNOSIS — E875 Hyperkalemia: Secondary | ICD-10-CM | POA: Diagnosis not present

## 2019-06-21 DIAGNOSIS — N184 Chronic kidney disease, stage 4 (severe): Secondary | ICD-10-CM | POA: Diagnosis present

## 2019-06-21 DIAGNOSIS — E1122 Type 2 diabetes mellitus with diabetic chronic kidney disease: Secondary | ICD-10-CM | POA: Diagnosis present

## 2019-06-21 DIAGNOSIS — Z85828 Personal history of other malignant neoplasm of skin: Secondary | ICD-10-CM

## 2019-06-21 DIAGNOSIS — R0602 Shortness of breath: Secondary | ICD-10-CM | POA: Diagnosis present

## 2019-06-21 DIAGNOSIS — Z794 Long term (current) use of insulin: Secondary | ICD-10-CM | POA: Diagnosis not present

## 2019-06-21 DIAGNOSIS — I509 Heart failure, unspecified: Secondary | ICD-10-CM

## 2019-06-21 DIAGNOSIS — J449 Chronic obstructive pulmonary disease, unspecified: Secondary | ICD-10-CM | POA: Diagnosis present

## 2019-06-21 DIAGNOSIS — Z8673 Personal history of transient ischemic attack (TIA), and cerebral infarction without residual deficits: Secondary | ICD-10-CM

## 2019-06-21 DIAGNOSIS — I70299 Other atherosclerosis of native arteries of extremities, unspecified extremity: Secondary | ICD-10-CM

## 2019-06-21 DIAGNOSIS — I5033 Acute on chronic diastolic (congestive) heart failure: Secondary | ICD-10-CM | POA: Diagnosis present

## 2019-06-21 DIAGNOSIS — M109 Gout, unspecified: Secondary | ICD-10-CM | POA: Diagnosis present

## 2019-06-21 DIAGNOSIS — Z833 Family history of diabetes mellitus: Secondary | ICD-10-CM

## 2019-06-21 DIAGNOSIS — Z6836 Body mass index (BMI) 36.0-36.9, adult: Secondary | ICD-10-CM

## 2019-06-21 DIAGNOSIS — I739 Peripheral vascular disease, unspecified: Secondary | ICD-10-CM

## 2019-06-21 DIAGNOSIS — T508X5A Adverse effect of diagnostic agents, initial encounter: Secondary | ICD-10-CM | POA: Diagnosis not present

## 2019-06-21 LAB — GLUCOSE, CAPILLARY
Glucose-Capillary: 108 mg/dL — ABNORMAL HIGH (ref 70–99)
Glucose-Capillary: 126 mg/dL — ABNORMAL HIGH (ref 70–99)
Glucose-Capillary: 150 mg/dL — ABNORMAL HIGH (ref 70–99)
Glucose-Capillary: 161 mg/dL — ABNORMAL HIGH (ref 70–99)

## 2019-06-21 LAB — BASIC METABOLIC PANEL
Anion gap: 11 (ref 5–15)
BUN: 45 mg/dL — ABNORMAL HIGH (ref 8–23)
CO2: 19 mmol/L — ABNORMAL LOW (ref 22–32)
Calcium: 8.1 mg/dL — ABNORMAL LOW (ref 8.9–10.3)
Chloride: 108 mmol/L (ref 98–111)
Creatinine, Ser: 1.67 mg/dL — ABNORMAL HIGH (ref 0.61–1.24)
GFR calc Af Amer: 42 mL/min — ABNORMAL LOW (ref >60)
GFR calc non Af Amer: 36 mL/min — ABNORMAL LOW (ref >60)
Glucose, Bld: 179 mg/dL — ABNORMAL HIGH (ref 70–99)
Potassium: 4.7 mmol/L (ref 3.5–5.1)
Sodium: 138 mmol/L (ref 135–145)

## 2019-06-21 LAB — BRAIN NATRIURETIC PEPTIDE: B Natriuretic Peptide: 354 pg/mL — ABNORMAL HIGH (ref 0.0–100.0)

## 2019-06-21 LAB — CBC
HCT: 30.2 % — ABNORMAL LOW (ref 39.0–52.0)
Hemoglobin: 9.7 g/dL — ABNORMAL LOW (ref 13.0–17.0)
MCH: 31.1 pg (ref 26.0–34.0)
MCHC: 32.1 g/dL (ref 30.0–36.0)
MCV: 96.8 fL (ref 80.0–100.0)
Platelets: 228 10*3/uL (ref 150–400)
RBC: 3.12 MIL/uL — ABNORMAL LOW (ref 4.22–5.81)
RDW: 13.8 % (ref 11.5–15.5)
WBC: 8.1 10*3/uL (ref 4.0–10.5)
nRBC: 0 % (ref 0.0–0.2)

## 2019-06-21 LAB — CREATININE, SERUM
Creatinine, Ser: 1.73 mg/dL — ABNORMAL HIGH (ref 0.61–1.24)
GFR calc Af Amer: 41 mL/min — ABNORMAL LOW (ref >60)
GFR calc non Af Amer: 35 mL/min — ABNORMAL LOW (ref >60)

## 2019-06-21 LAB — BUN: BUN: 43 mg/dL — ABNORMAL HIGH (ref 8–23)

## 2019-06-21 LAB — SARS CORONAVIRUS 2 (TAT 6-24 HRS): SARS Coronavirus 2: NEGATIVE

## 2019-06-21 SURGERY — LOWER EXTREMITY ANGIOGRAPHY
Anesthesia: Moderate Sedation | Site: Leg Lower | Laterality: Left

## 2019-06-21 MED ORDER — IPRATROPIUM-ALBUTEROL 0.5-2.5 (3) MG/3ML IN SOLN: 3.0000 mL | Freq: Once | RESPIRATORY_TRACT | Status: DC

## 2019-06-21 MED ORDER — HYDRALAZINE HCL 25 MG PO TABS: 25.0000 mg | ORAL_TABLET | Freq: Three times a day (TID) | ORAL | Status: DC | PRN

## 2019-06-21 MED ORDER — FELODIPINE ER 5 MG PO TB24
7.5000 mg | ORAL_TABLET | Freq: Every day | ORAL | Status: DC
  Administered 2019-06-22 – 2019-06-27 (×5): 7.5 mg via ORAL
  Filled 2019-06-21 (×8): qty 1

## 2019-06-21 MED ORDER — INSULIN ASPART 100 UNIT/ML ~~LOC~~ SOLN
0.0000 [IU] | SUBCUTANEOUS | Status: DC
  Administered 2019-06-21: 2 [IU] via SUBCUTANEOUS
  Administered 2019-06-22 (×2): 3 [IU] via SUBCUTANEOUS
  Administered 2019-06-22: 12:00:00 1 [IU] via SUBCUTANEOUS
  Administered 2019-06-22: 17:00:00 3 [IU] via SUBCUTANEOUS
  Administered 2019-06-23: 22:00:00 1 [IU] via SUBCUTANEOUS
  Administered 2019-06-23: 17:00:00 3 [IU] via SUBCUTANEOUS
  Administered 2019-06-23: 21:00:00 2 [IU] via SUBCUTANEOUS
  Administered 2019-06-23: 1 [IU] via SUBCUTANEOUS
  Administered 2019-06-25: 5 [IU] via SUBCUTANEOUS
  Administered 2019-06-25: 1 [IU] via SUBCUTANEOUS
  Administered 2019-06-25: 13:00:00 3 [IU] via SUBCUTANEOUS
  Administered 2019-06-25: 05:00:00 1 [IU] via SUBCUTANEOUS
  Administered 2019-06-25 – 2019-06-26 (×2): 3 [IU] via SUBCUTANEOUS
  Administered 2019-06-26: 01:00:00 2 [IU] via SUBCUTANEOUS
  Administered 2019-06-26 – 2019-06-27 (×2): 3 [IU] via SUBCUTANEOUS
  Administered 2019-06-27: 05:00:00 1 [IU] via SUBCUTANEOUS
  Administered 2019-06-27 (×2): 5 [IU] via SUBCUTANEOUS
  Administered 2019-06-27: 1 [IU] via SUBCUTANEOUS
  Administered 2019-06-27: 3 [IU] via SUBCUTANEOUS
  Administered 2019-06-28 – 2019-06-30 (×8): 2 [IU] via SUBCUTANEOUS
  Administered 2019-06-30: 18:00:00 5 [IU] via SUBCUTANEOUS
  Filled 2019-06-21 (×33): qty 1

## 2019-06-21 MED ORDER — SODIUM CHLORIDE 0.9% FLUSH
3.0000 mL | Freq: Two times a day (BID) | INTRAVENOUS | Status: DC
  Administered 2019-06-21 – 2019-06-28 (×11): 3 mL via INTRAVENOUS

## 2019-06-21 MED ORDER — METOPROLOL TARTRATE 25 MG PO TABS
25.0000 mg | ORAL_TABLET | Freq: Two times a day (BID) | ORAL | Status: DC
  Administered 2019-06-21 – 2019-07-01 (×20): 25 mg via ORAL
  Filled 2019-06-21 (×21): qty 1

## 2019-06-21 MED ORDER — INSULIN DETEMIR 100 UNIT/ML ~~LOC~~ SOLN
14.0000 [IU] | Freq: Every morning | SUBCUTANEOUS | Status: DC
  Filled 2019-06-21 (×2): qty 0.14

## 2019-06-21 MED ORDER — HYDROMORPHONE HCL 1 MG/ML IJ SOLN: 1.0000 mg | Freq: Once | INTRAMUSCULAR | Status: DC | PRN

## 2019-06-21 MED ORDER — INSULIN DETEMIR 100 UNIT/ML ~~LOC~~ SOLN
20.0000 [IU] | Freq: Every day | SUBCUTANEOUS | Status: DC
  Administered 2019-06-21: 20 [IU] via SUBCUTANEOUS
  Filled 2019-06-21 (×2): qty 0.2

## 2019-06-21 MED ORDER — SODIUM CHLORIDE 0.9 % IR SOLN
30.0000 mL | Freq: Every evening | Status: DC
  Administered 2019-06-21 – 2019-06-30 (×8): 30 mL

## 2019-06-21 MED ORDER — PRAVASTATIN SODIUM 40 MG PO TABS
40.0000 mg | ORAL_TABLET | Freq: Every day | ORAL | Status: DC
  Administered 2019-06-21 – 2019-06-30 (×9): 40 mg via ORAL
  Filled 2019-06-21 (×9): qty 1

## 2019-06-21 MED ORDER — MIDAZOLAM HCL 2 MG/ML PO SYRP: 8.0000 mg | ORAL_SOLUTION | Freq: Once | ORAL | Status: DC | PRN

## 2019-06-21 MED ORDER — SODIUM CHLORIDE 0.9 % IV SOLN: 250.0000 mL | INTRAVENOUS | Status: DC | PRN

## 2019-06-21 MED ORDER — CEFAZOLIN SODIUM-DEXTROSE 2-4 GM/100ML-% IV SOLN
INTRAVENOUS | Status: AC
  Filled 2019-06-21: qty 100

## 2019-06-21 MED ORDER — FERROUS SULFATE 325 (65 FE) MG PO TABS
325.0000 mg | ORAL_TABLET | Freq: Every day | ORAL | Status: DC
  Administered 2019-06-22 – 2019-07-01 (×9): 325 mg via ORAL
  Filled 2019-06-21 (×9): qty 1

## 2019-06-21 MED ORDER — FUROSEMIDE 10 MG/ML IJ SOLN
40.0000 mg | Freq: Two times a day (BID) | INTRAMUSCULAR | Status: DC
  Administered 2019-06-21 – 2019-06-22 (×4): 40 mg via INTRAVENOUS
  Filled 2019-06-21 (×4): qty 4

## 2019-06-21 MED ORDER — METHYLPREDNISOLONE SODIUM SUCC 125 MG IJ SOLR: 125.0000 mg | Freq: Once | INTRAMUSCULAR | Status: DC | PRN

## 2019-06-21 MED ORDER — SODIUM CHLORIDE 0.9 % IV SOLN: INTRAVENOUS | Status: DC

## 2019-06-21 MED ORDER — INSULIN DETEMIR 100 UNIT/ML FLEXPEN: 14.0000 [IU] | PEN_INJECTOR | SUBCUTANEOUS | Status: DC

## 2019-06-21 MED ORDER — CEFAZOLIN SODIUM-DEXTROSE 2-4 GM/100ML-% IV SOLN: 2.0000 g | Freq: Once | INTRAVENOUS | Status: DC

## 2019-06-21 MED ORDER — MORPHINE SULFATE (PF) 4 MG/ML IV SOLN: 1.0000 mg | INTRAVENOUS | Status: DC | PRN

## 2019-06-21 MED ORDER — HEPARIN SODIUM (PORCINE) 5000 UNIT/ML IJ SOLN
5000.0000 [IU] | Freq: Three times a day (TID) | INTRAMUSCULAR | Status: DC
  Administered 2019-06-21 – 2019-06-23 (×7): 5000 [IU] via SUBCUTANEOUS
  Filled 2019-06-21 (×8): qty 1

## 2019-06-21 MED ORDER — ACETAMINOPHEN 325 MG PO TABS: 650.0000 mg | ORAL_TABLET | ORAL | Status: DC | PRN

## 2019-06-21 MED ORDER — ONDANSETRON HCL 4 MG/2ML IJ SOLN: 4.0000 mg | Freq: Four times a day (QID) | INTRAMUSCULAR | Status: DC | PRN

## 2019-06-21 MED ORDER — ASPIRIN EC 81 MG PO TBEC
81.0000 mg | DELAYED_RELEASE_TABLET | Freq: Every day | ORAL | Status: DC
  Administered 2019-06-22 – 2019-07-01 (×9): 81 mg via ORAL
  Filled 2019-06-21 (×10): qty 1

## 2019-06-21 MED ORDER — HYDRALAZINE HCL 25 MG PO TABS
25.0000 mg | ORAL_TABLET | Freq: Three times a day (TID) | ORAL | Status: DC
  Administered 2019-06-21 – 2019-07-01 (×29): 25 mg via ORAL
  Filled 2019-06-21 (×29): qty 1

## 2019-06-21 MED ORDER — SODIUM CHLORIDE 0.9% FLUSH: 3.0000 mL | INTRAVENOUS | Status: DC | PRN

## 2019-06-21 MED ORDER — FAMOTIDINE 20 MG PO TABS: 40.0000 mg | ORAL_TABLET | Freq: Once | ORAL | Status: DC | PRN

## 2019-06-21 MED ORDER — LORATADINE 10 MG PO TABS
10.0000 mg | ORAL_TABLET | Freq: Every day | ORAL | Status: DC
  Administered 2019-06-21 – 2019-07-01 (×10): 10 mg via ORAL
  Filled 2019-06-21 (×9): qty 1

## 2019-06-21 MED ORDER — DIPHENHYDRAMINE HCL 50 MG/ML IJ SOLN: 50.0000 mg | Freq: Once | INTRAMUSCULAR | Status: DC | PRN

## 2019-06-21 MED ORDER — OCUVITE-LUTEIN PO CAPS
1.0000 | ORAL_CAPSULE | Freq: Every day | ORAL | Status: DC
  Administered 2019-06-22 – 2019-06-30 (×8): 1 via ORAL
  Filled 2019-06-21 (×9): qty 1

## 2019-06-21 MED ORDER — ACETIC ACID 0.25 % IR SOLN: 1.0000 "application " | Status: DC

## 2019-06-21 NOTE — Progress Notes (Signed)
Dr. Delana Meyer and Marcelle Overlie, PA informed of patient's apparent CHF exacerbation. Patient presented with pitting edema +3 from the waste down, weeping extremities, crackles in the lung bases, and SOB with exertion. STAT CXR was ordered and hospitalist consult was completed by the PA. Will await the hospitalist to come and assess the patient.   Mattie Marlin, RN

## 2019-06-21 NOTE — H&P (Deleted)
[facility] History & Physical Update  The patient was interviewed and re-examined.  The patient's previous History and Physical has been reviewed and is unchanged.  There is no change in the plan of care. We plan to proceed with the scheduled procedure.  Hortencia Pilar, MD  06/21/2019, 9:17 AM

## 2019-06-21 NOTE — H&P (Signed)
History and Physical    Miguel Torres NID:[id number] DOB: 1930-08-09 DOA: 06/21/2019  Referring MD/NP/PA:   PCP: [name], MD   Patient coming from:  The patient is coming from home.  At baseline, pt is independent for most of ADL.        Chief Complaint: SOB and leg edema  HPI: Miguel Torres is a 86 y.o. male with medical history significant of hypertension, hyperlipidemia, diabetes mellitus, dCHF, iron deficiency anemia, CKD-4, s/p of right BKA, atrial flutter on Eliquis, who presents with shortness breath, leg edema.   Pt has hx of atherosclerosis of native arteries of the extremities with ulceration. He is scheduled for angiography of the left lower extremity with the hope for intervention for limb salvage by VVS, however pt is found to have SOB and severe leg edema, concerning for CHF exacerbation. The procedure is canceled and we are asked to treat his CHF exacerbation.  Patient states that he has dry cough, shortness of breath, no chest pain, fever or chills.  No nausea vomiting, diarrhea, abdominal pain, symptoms of UTI or unilateral weakness.  He has a severe bilateral leg edema.  ED Course: pt was found to have BNP 354, negative Covid PCR on 06/20/2019, stable renal function, temperature 97.5, blood pressure 149/84, heart rate 67, RR 21, oxygen saturation 95% on room air.  Chest x-ray showed mild pulmonary edema with trace bilateral pleural effusion.  Patient is admitted to [place] bed as inpatient with cardiac monitor.  Review of Systems:   General: no fevers, chills, has poor appetite, has fatigue HEENT: no blurry vision, hearing changes or sore throat Respiratory: has dyspnea, coughing, no wheezing CV: no chest pain, no palpitations GI: no nausea, vomiting, abdominal pain, diarrhea, constipation GU: no dysuria, burning on urination, increased urinary frequency, hematuria  Ext: has leg edema Neuro: no unilateral weakness, numbness, or tingling, no vision change  or hearing loss Skin: no rash MSK: No muscle spasm, no deformity, no limitation of range of movement in spin Heme: No easy bruising.  Travel history: No recent long distant travel.  Allergy: No Known Allergies  Past Medical History:  Diagnosis Date  . Anemia   . CHF (congestive heart failure) ([place])   . Diabetes mellitus without complication ([place])   . Hyperlipidemia   . Hypertension   . Skin cancer     Past Surgical History:  Procedure Laterality Date  . BASAL CELL CARCINOMA EXCISION    . History of eye surgery Bilateral   . IR CATHETER TUBE CHANGE  03/01/2018  . MOHS SURGERY  04/2011   the top of his head  . Right BKA  09/12/2010   Dr. Delana Meyer; Secondary gangrenous right LE  . TONSILLECTOMY      Social History:  reports that he has never smoked. He has never used smokeless tobacco. He reports that he does not drink alcohol or use drugs.  Family History:  Family History  Problem Relation Age of Onset  . Diabetes Mother   . Arthritis Sister   . Diabetes Daughter      Prior to Admission medications   Medication Sig Start Date End Date Taking? Authorizing Provider  acetaminophen (TYLENOL) 325 MG tablet Take 2 tablets (650 mg total) by mouth every 6 (six) hours as needed for mild pain (or Fever >/= 101). Patient taking differently: Take 325 mg by mouth every 4 (four) hours as needed for mild pain (general discomfort Max 3 grams/24hrs.).  12/09/17  Yes Gouru, [place],  MD  acetic acid 0.25 % irrigation Irrigate with 1 application as directed See admin instructions. Use 30 ml via irrigation every evening shift for decrease bacterial load of supra pubic catheter   Yes [provider]  apixaban (ELIQUIS) 2.5 MG TABS tablet Take 1 tablet (2.5 mg total) by mouth 2 (two) times daily. 11/04/17  Yes Henreitta Leber, MD  beta carotene w/minerals (OCUVITE) tablet Take 1 tablet by mouth daily.   Yes [provider]  Cholecalciferol (VITAMIN D3) 50000 units CAPS Take 50,000  Units by mouth every Sunday.    Yes [provider]  felodipine (PLENDIL) 2.5 MG 24 hr tablet Take 7.5 mg by mouth daily.   Yes [provider]  ferrous sulfate 325 (65 FE) MG tablet Take 325 mg by mouth daily.    Yes [provider]  fexofenadine (ALLEGRA) 60 MG tablet Take 60 mg by mouth daily as needed for rhinitis (seasonal allergies.).    Yes [provider]  furosemide (LASIX) 40 MG tablet Take 40 mg by mouth daily.   Yes [provider]  hydrALAZINE (APRESOLINE) 25 MG tablet Take 25 mg by mouth 3 (three) times daily.    Yes [provider]  insulin aspart (NOVOLOG) 100 UNIT/ML injection Inject 0-9 Units into the skin 3 (three) times daily with meals. Patient taking differently: Inject 3-11 Units into the skin 3 (three) times daily with meals. Sliding Scale Insulin:150-200=3 units, 201-250=5 units, 251-300=7 units, 301-350=9 units, 351-450=11 units, >450 call NP OR MD 12/21/17  Yes Epifanio Lesches, MD  LEVEMIR FLEXTOUCH 100 UNIT/ML Pen Inject 22 Units into the skin See admin instructions. Inject 22 units subcutaneously daily in the morning & inject 32 units subcutaneously at bedtime. 01/27/19  Yes [provider]  lovastatin (MEVACOR) 40 MG tablet TAKE 1 TABLET (40 MG TOTAL) BY MOUTH DAILY. Patient taking differently: Take 40 mg by mouth daily.  05/11/17  Yes [name], MD  metoprolol tartrate (LOPRESSOR) 25 MG tablet Take 25 mg by mouth 2 (two) times daily.   Yes [provider]    Physical Exam: Vitals:   06/21/19 0931 06/21/19 1412 06/21/19 1441  BP: (!) 149/84  (!) 143/102  Pulse: 67 78 89  Resp: (!) 21 16 16   Temp: (!) 97.5 F (36.4 C)  97.7 F (36.5 C)  TempSrc: Oral  Oral  SpO2: 95% 96% 99%  Weight: 99.8 kg    Height: 5\' 8"  (1.727 m)     General: Not in acute distress HEENT:       Eyes: PERRL, EOMI, no scleral icterus.       ENT: No discharge from the ears and nose, no pharynx injection, no  tonsillar enlargement.        Neck: Has positive JVD, no bruit, no mass felt. Heme: No neck lymph node enlargement. Cardiac: S1/S2, RRR, No murmurs, No gallops or rubs. Respiratory: Has fine crackles bilaterally GI: Soft, nondistended, nontender, no rebound pain, no organomegaly, BS present. GU: No hematuria Ext: 3+ pitting leg edema bilaterally. S/p of R BKA. Musculoskeletal: No joint deformities, No joint redness or warmth, no limitation of ROM in spin. Skin: No rashes.  Neuro: Alert, oriented X3, cranial nerves II-XII grossly intact, moves all extremities normally.  Psych: Patient is not psychotic, no suicidal or hemocidal ideation.  Labs on Admission: I have personally reviewed following labs and imaging studies  CBC: Recent Labs  Lab 06/21/19 1453  WBC 8.1  HGB 9.7*  HCT 30.2*  MCV 96.8  PLT 735   Basic Metabolic Panel: Recent Labs  Lab 06/21/19 0936 06/21/19 1453  NA  --  138  K  --  4.7  CL  --  108  CO2  --  19*  GLUCOSE  --  179*  BUN 43* 45*  CREATININE 1.73* 1.67*  CALCIUM  --  8.1*   GFR: Estimated Creatinine Clearance: 36.4 mL/min (A) (by C-G formula based on SCr of 1.67 mg/dL (H)). Liver Function Tests: No results for input(s): AST, ALT, ALKPHOS, BILITOT, PROT, ALBUMIN in the last 168 hours. No results for input(s): LIPASE, AMYLASE in the last 168 hours. No results for input(s): AMMONIA in the last 168 hours. Coagulation Profile: No results for input(s): INR, PROTIME in the last 168 hours. Cardiac Enzymes: No results for input(s): CKTOTAL, CKMB, CKMBINDEX, TROPONINI in the last 168 hours. BNP (last 3 results) No results for input(s): PROBNP in the last 8760 hours. HbA1C: No results for input(s): HGBA1C in the last 72 hours. CBG: Recent Labs  Lab 06/21/19 0931 06/21/19 1626  GLUCAP 108* 161*   Lipid Profile: No results for input(s): CHOL, HDL, LDLCALC, TRIG, CHOLHDL, LDLDIRECT in the last 72 hours. Thyroid Function Tests: No results for  input(s): TSH, T4TOTAL, FREET4, T3FREE, THYROIDAB in the last 72 hours. Anemia Panel: No results for input(s): VITAMINB12, FOLATE, FERRITIN, TIBC, IRON, RETICCTPCT in the last 72 hours. Urine analysis:    Component Value Date/Time   COLORURINE YELLOW (A) 12/16/2017 1624   APPEARANCEUR CLEAR (A) 12/16/2017 1624   LABSPEC 1.013 12/16/2017 1624   PHURINE 8.0 12/16/2017 1624   GLUCOSEU NEGATIVE 12/16/2017 1624   HGBUR NEGATIVE 12/16/2017 1624   BILIRUBINUR NEGATIVE 12/16/2017 1624   KETONESUR NEGATIVE 12/16/2017 1624   PROTEINUR 100 (A) 12/16/2017 1624   NITRITE NEGATIVE 12/16/2017 1624   LEUKOCYTESUR NEGATIVE 12/16/2017 1624   Sepsis Labs: @LABRCNTIP (procalcitonin:4,lacticidven:4) ) Recent Results (from the past 240 hour(s))  SARS CORONAVIRUS 2 (TAT 6-24 HRS) Nasopharyngeal Nasopharyngeal Swab     Status: None   Collection Time: 06/20/19  9:42 AM   Specimen: Nasopharyngeal Swab  Result Value Ref Range Status   SARS Coronavirus 2 NEGATIVE NEGATIVE Final    Comment: (NOTE) SARS-CoV-2 target nucleic acids are NOT DETECTED. The SARS-CoV-2 RNA is generally detectable in upper and lower respiratory specimens during the acute phase of infection. Negative results do not preclude SARS-CoV-2 infection, do not rule out co-infections with other pathogens, and should not be used as the sole basis for treatment or other patient management decisions. Negative results must be combined with clinical observations, patient history, and epidemiological information. The expected result is Negative. Fact Sheet for Patients: SugarRoll.be Fact Sheet for Healthcare Providers: https://www.woods-mathews.com/ This test is not yet approved or cleared by the [place] FDA and  has been authorized for detection and/or diagnosis of SARS-CoV-2 by FDA under an Emergency Use Authorization (EUA). This EUA will remain  in effect (meaning this test can be used) for the  duration of the COVID-19 declaration under Section 56 4(b)(1) of the Act, 21 U.S.C. section 360bbb-3(b)(1), unless the authorization is terminated or revoked sooner. Performed at [facility], [place] [address], [place], [place] [address]      Radiological Exams on Admission: DG Chest Port 1 View  Result Date: 06/21/2019 CLINICAL DATA:  Dyspnea, CHF EXAM: PORTABLE CHEST 1 VIEW COMPARISON:  12/17/2017 chest radiograph. FINDINGS: Stable cardiomediastinal silhouette with mild cardiomegaly. No pneumothorax. Trace bilateral pleural effusions. Mild diffuse prominence of the parahilar  interstitial markings. IMPRESSION: Mild congestive heart failure with trace bilateral pleural effusions. Electronically Signed   By: Ilona Sorrel M.D.   On: 06/21/2019 10:06     EKG: Independently reviewed.  A. fib, QTc 433, LAD, poor R wave progression  Assessment/Plan Principal Problem:   Acute on chronic diastolic CHF (congestive heart failure) (HCC) Active Problems:   CKD (chronic kidney disease) stage 4, GFR 15-29 ml/min (HCC)   HLD (hyperlipidemia)   Atrial flutter (HCC)   HTN (hypertension)   Type II diabetes mellitus with renal manifestations (HCC)   Iron deficiency anemia   Atherosclerosis of artery of extremity with ulceration (HCC)   Acute on chronic diastolic CHF (congestive heart failure) [facility]): Patient has 3+ leg edema, elevated BNP, chest x-ray showed pulmonary edema, consistent with CHF exacerbation.  -will admit to med-surg bed as inpt with cardiac monitoring -Lasix 40 mg bid by IV -trend trop -2d echo -Daily weights -strict I/O's -Low salt diet -Fluid restriction  CKD (chronic kidney disease) stage 4, GFR 15-29 ml/min (HCC): stable -f/u by BMP  HLD (hyperlipidemia): -Pravastatin  Atrial flutter ([place]); -Continue metoprolol -Hold Eliquis since patient may need a procedure when CHF is controlled  HTN:  -Continue home medications: Felodipine, hydralazine,  metoprolol, -hydralazine prn  Type II diabetes mellitus with renal manifestations ([place]): Most recent A1c 7.1, poorly controled. Patient is taking NovoLog and Levemir at home -will decrease Levemir on auscultation from 22-32 to 14-20 units twice daily -SSI  Iron deficiency anemia: Hemoglobin 9.7 (10.1 on 01/25/2018) -Continue iron supplement  Atherosclerosis of artery of extremity with ulceration ([place]): -angiography of the left lower extremity with the hope for intervention for limb salvage by VVS    Inpatient status:  # Patient requires inpatient status due to high intensity of service, high risk for further deterioration and high frequency of surveillance required.  I certify that at the point of admission it is my clinical judgment that the patient will require inpatient hospital care spanning beyond 2 midnights from the point of admission.  . This patient has multiple chronic comorbidities including hypertension, hyperlipidemia, diabetes mellitus, dCHF, iron deficiency anemia, CKD-4, s/p of right BKA, atrial flutter on Eliquis . Now patient has presenting with CHF exacerbation . The worrisome physical exam findings include 3+ bilateral leg edema, crackles on auscultation, positive JVD . The initial radiographic and laboratory data are worrisome because of elevated BNP, pulmonary edema chest x-ray . Current medical needs: please see my assessment and plan . Predictability of an adverse outcome (risk): Patient has multiple comorbidities as listed above. Now presents with severe CHF exacerbation. Patient's presentation is highly complicated.  Patient is at high risk of deteriorating.  Will need to be treated in hospital for at least 2 days.          DVT ppx: SQ Heparin    Code Status: Full code Family Communication: Yes, patient's daughter at bed side Disposition Plan:  Anticipate discharge back to previous home environment Consults called:  none Admission status: Med-surg bed  as inpt   Date of Service 06/21/2019    [facility]   If 7PM-7AM, please contact night-coverage [email or web address] 06/21/2019, 6:47 PM

## 2019-06-22 DIAGNOSIS — I5033 Acute on chronic diastolic (congestive) heart failure: Secondary | ICD-10-CM

## 2019-06-22 LAB — BASIC METABOLIC PANEL
Anion gap: 9 (ref 5–15)
BUN: 48 mg/dL — ABNORMAL HIGH (ref 8–23)
CO2: 22 mmol/L (ref 22–32)
Calcium: 8.1 mg/dL — ABNORMAL LOW (ref 8.9–10.3)
Chloride: 107 mmol/L (ref 98–111)
Creatinine, Ser: 1.71 mg/dL — ABNORMAL HIGH (ref 0.61–1.24)
GFR calc Af Amer: 41 mL/min — ABNORMAL LOW (ref >60)
GFR calc non Af Amer: 35 mL/min — ABNORMAL LOW (ref >60)
Glucose, Bld: 81 mg/dL (ref 70–99)
Potassium: 4.5 mmol/L (ref 3.5–5.1)
Sodium: 138 mmol/L (ref 135–145)

## 2019-06-22 LAB — ECHOCARDIOGRAM COMPLETE
Height: 68 in
Weight: 3520 oz

## 2019-06-22 LAB — GLUCOSE, CAPILLARY
Glucose-Capillary: 141 mg/dL — ABNORMAL HIGH (ref 70–99)
Glucose-Capillary: 206 mg/dL — ABNORMAL HIGH (ref 70–99)
Glucose-Capillary: 220 mg/dL — ABNORMAL HIGH (ref 70–99)
Glucose-Capillary: 230 mg/dL — ABNORMAL HIGH (ref 70–99)
Glucose-Capillary: 52 mg/dL — ABNORMAL LOW (ref 70–99)
Glucose-Capillary: 67 mg/dL — ABNORMAL LOW (ref 70–99)
Glucose-Capillary: 69 mg/dL — ABNORMAL LOW (ref 70–99)

## 2019-06-22 MED ORDER — INSULIN DETEMIR 100 UNIT/ML ~~LOC~~ SOLN
9.0000 [IU] | Freq: Every day | SUBCUTANEOUS | Status: DC
  Administered 2019-06-22: 21:00:00 9 [IU] via SUBCUTANEOUS
  Filled 2019-06-22 (×2): qty 0.09

## 2019-06-22 MED ORDER — INSULIN DETEMIR 100 UNIT/ML ~~LOC~~ SOLN: 12.0000 [IU] | Freq: Every day | SUBCUTANEOUS | Status: DC

## 2019-06-22 MED ORDER — ACETAMINOPHEN 325 MG PO TABS
650.0000 mg | ORAL_TABLET | ORAL | Status: DC | PRN
  Administered 2019-06-25 – 2019-06-29 (×5): 650 mg via ORAL
  Filled 2019-06-22 (×5): qty 2

## 2019-06-22 NOTE — Progress Notes (Addendum)
FSBS 52, pt asymptomatic.given orange juice, recheck FSBS 67. Pt waiting for breakfast. Will continue to monitor.   Levemir held, MD aware and will adjust dose.

## 2019-06-22 NOTE — Progress Notes (Signed)
PROGRESS NOTE    Miguel Torres  HWE:[id number] DOB: 1930/11/29 DOA: 06/21/2019 PCP: [name], MD    Brief Narrative:  Miguel Torres is a 86 y.o. male with medical history significant of hypertension, hyperlipidemia, diabetes mellitus, dCHF, iron deficiency anemia, CKD-4, s/p of right BKA, atrial flutter on Eliquis, who presents with shortness breath, leg edema.   Pt has hx of atherosclerosis of native arteries of the extremities with ulceration. He is scheduled for angiography of theleftlower extremitywith the hope for intervention for limb salvage by VVS, however pt is found to have SOB and severe leg edema, concerning for CHF exacerbation. The procedure is canceled and we are asked to treat his CHF exacerbation.  Patient states that he has dry cough, shortness of breath, no chest pain, fever or chills.  No nausea vomiting, diarrhea, abdominal pain, symptoms of UTI or unilateral weakness.  He has a severe bilateral leg edema.    Consultants:     Procedures:   Antimicrobials:      Subjective: Has no complaints. SOB mildly better. BG level was 52 and was given orange juice.  Recheck was 81.  Patient was asymptomatic.  Levemir dose was held  Objective: Vitals:   06/22/19 0003 06/22/19 0356 06/22/19 0809 06/22/19 1142  BP: (!) 145/72 (!) 145/67 134/67 133/64  Pulse: 64 (!) 59 60 (!) 57  Resp:  19 18 18   Temp:  97.7 F (36.5 C) 98 F (36.7 C) 98 F (36.7 C)  TempSrc:  Oral Oral   SpO2:  99% 97% 96%  Weight:  110.7 kg    Height:        Intake/Output Summary (Last 24 hours) at 06/22/2019 1349 Last data filed at 06/22/2019 1130 Gross per 24 hour  Intake --  Output 2700 ml  Net -2700 ml   Filed Weights   06/21/19 0931 06/22/19 0356  Weight: 99.8 kg 110.7 kg    Examination:  General exam: Appears calm and comfortable  Respiratory system: Clear to auscultation. Respiratory effort normal. Cardiovascular system: S1 & S2 heard, RRR.  No murmurs,  rubs, gallops or clicks.  Gastrointestinal system: Abdomen is nondistended, soft and nontender. Normal bowel sounds heard. Central nervous system: Alert and oriented.  Grossly intact Extremities: Mild bilateral edema with chronic skin changes Skin: No rashes, lesions or ulcers Psychiatry:  Mood & affect appropriate for current setting.     Data Reviewed: I have personally reviewed following labs and imaging studies  CBC: Recent Labs  Lab 06/21/19 1453  WBC 8.1  HGB 9.7*  HCT 30.2*  MCV 96.8  PLT 893   Basic Metabolic Panel: Recent Labs  Lab 06/21/19 0936 06/21/19 1453 06/22/19 0506  NA  --  138 138  K  --  4.7 4.5  CL  --  108 107  CO2  --  19* 22  GLUCOSE  --  179* 81  BUN 43* 45* 48*  CREATININE 1.73* 1.67* 1.71*  CALCIUM  --  8.1* 8.1*   GFR: Estimated Creatinine Clearance: 37.4 mL/min (A) (by C-G formula based on SCr of 1.71 mg/dL (H)). Liver Function Tests: No results for input(s): AST, ALT, ALKPHOS, BILITOT, PROT, ALBUMIN in the last 168 hours. No results for input(s): LIPASE, AMYLASE in the last 168 hours. No results for input(s): AMMONIA in the last 168 hours. Coagulation Profile: No results for input(s): INR, PROTIME in the last 168 hours. Cardiac Enzymes: No results for input(s): CKTOTAL, CKMB, CKMBINDEX, TROPONINI in the last 168 hours.  BNP (last 3 results) No results for input(s): PROBNP in the last 8760 hours. HbA1C: No results for input(s): HGBA1C in the last 72 hours. CBG: Recent Labs  Lab 06/21/19 2340 06/22/19 0358 06/22/19 0810 06/22/19 0836 06/22/19 1144  GLUCAP 126* 69* 52* 67* 141*   Lipid Profile: No results for input(s): CHOL, HDL, LDLCALC, TRIG, CHOLHDL, LDLDIRECT in the last 72 hours. Thyroid Function Tests: No results for input(s): TSH, T4TOTAL, FREET4, T3FREE, THYROIDAB in the last 72 hours. Anemia Panel: No results for input(s): VITAMINB12, FOLATE, FERRITIN, TIBC, IRON, RETICCTPCT in the last 72 hours. Sepsis Labs: No  results for input(s): PROCALCITON, LATICACIDVEN in the last 168 hours.  Recent Results (from the past 240 hour(s))  SARS CORONAVIRUS 2 (TAT 6-24 HRS) Nasopharyngeal Nasopharyngeal Swab     Status: None   Collection Time: 06/20/19  9:42 AM   Specimen: Nasopharyngeal Swab  Result Value Ref Range Status   SARS Coronavirus 2 NEGATIVE NEGATIVE Final    Comment: (NOTE) SARS-CoV-2 target nucleic acids are NOT DETECTED. The SARS-CoV-2 RNA is generally detectable in upper and lower respiratory specimens during the acute phase of infection. Negative results do not preclude SARS-CoV-2 infection, do not rule out co-infections with other pathogens, and should not be used as the sole basis for treatment or other patient management decisions. Negative results must be combined with clinical observations, patient history, and epidemiological information. The expected result is Negative. Fact Sheet for Patients: SugarRoll.be Fact Sheet for Healthcare Providers: https://www.woods-mathews.com/ This test is not yet approved or cleared by the [place] FDA and  has been authorized for detection and/or diagnosis of SARS-CoV-2 by FDA under an Emergency Use Authorization (EUA). This EUA will remain  in effect (meaning this test can be used) for the duration of the COVID-19 declaration under Section 56 4(b)(1) of the Act, 21 U.S.C. section 360bbb-3(b)(1), unless the authorization is terminated or revoked sooner. Performed at [facility], [place] [address] [place], [place] [address]          Radiology Studies: DG Chest Port 1 View  Result Date: 06/21/2019 CLINICAL DATA:  Dyspnea, CHF EXAM: PORTABLE CHEST 1 VIEW COMPARISON:  12/17/2017 chest radiograph. FINDINGS: Stable cardiomediastinal silhouette with mild cardiomegaly. No pneumothorax. Trace bilateral pleural effusions. Mild diffuse prominence of the parahilar interstitial markings. IMPRESSION: Mild  congestive heart failure with trace bilateral pleural effusions. Electronically Signed   By: Ilona Sorrel M.D.   On: 06/21/2019 10:06   ECHOCARDIOGRAM COMPLETE  Result Date: 06/22/2019    ECHOCARDIOGRAM REPORT   Patient Name:   Miguel Torres Date of Exam: 06/21/2019 Medical Rec #:  [id number]          Height:       68.0 in Accession #:    [id number]         Weight:       220.0 lb Date of Birth:  05-Nov-1930           BSA:          2.13 m Patient Age:    55 years           BP:           143/102 mmHg Patient Gender: M                  HR:           64 bpm. Exam Location:  [facility] Procedure: 2D Echo, Cardiac Doppler and Color Doppler Indications:     I50.31  Acute Diastolic Congestive heart failure  History:         Patient has prior history of Echocardiogram examinations, most                  recent 11/02/2017. Risk Factors:Hypertension, Diabetes and                  Dyslipidemia. Anemia. Congestive heart failure.  Sonographer:     Wilford Sports Rodgers-Jones Referring Phys:  [place] Diagnosing Phys: Neoma Laming MD IMPRESSIONS  1. Left ventricular ejection fraction, by estimation, is 50 to 55%. The left ventricle has low normal function. The left ventricle demonstrates global hypokinesis. The left ventricular internal cavity size was mildly to moderately dilated. There is mild  concentric left ventricular hypertrophy. Left ventricular diastolic parameters are consistent with Grade I diastolic dysfunction (impaired relaxation).  2. Right ventricular systolic function is moderately reduced. The right ventricular size is moderately enlarged. There is severely elevated pulmonary artery systolic pressure.  3. Left atrial size was mild to moderately dilated.  4. Right atrial size was mild to moderately dilated.  5. The mitral valve is degenerative. Trivial mitral valve regurgitation.  6. The tricuspid valve is degenerative. Tricuspid valve regurgitation is mild to moderate.  7. The aortic valve is tricuspid. Aortic valve  regurgitation is trivial. Mild aortic valve sclerosis is present, with no evidence of aortic valve stenosis. FINDINGS  Left Ventricle: Left ventricular ejection fraction, by estimation, is 50 to 55%. The left ventricle has low normal function. The left ventricle demonstrates global hypokinesis. The left ventricular internal cavity size was mildly to moderately dilated. There is mild concentric left ventricular hypertrophy. Left ventricular diastolic parameters are consistent with Grade I diastolic dysfunction (impaired relaxation). Right Ventricle: The right ventricular size is moderately enlarged. No increase in right ventricular wall thickness. Right ventricular systolic function is moderately reduced. There is severely elevated pulmonary artery systolic pressure. The tricuspid regurgitant velocity is 4.19 m/s, and with an assumed right atrial pressure of 10 mmHg, the estimated right ventricular systolic pressure is 41.6 mmHg. Left Atrium: Left atrial size was mild to moderately dilated. Right Atrium: Right atrial size was mild to moderately dilated. Pericardium: Trivial pericardial effusion is present. Mitral Valve: The mitral valve is degenerative in appearance. Moderate mitral annular calcification. Trivial mitral valve regurgitation. Tricuspid Valve: The tricuspid valve is degenerative in appearance. Tricuspid valve regurgitation is mild to moderate. Aortic Valve: The aortic valve is tricuspid. Aortic valve regurgitation is trivial. Mild aortic valve sclerosis is present, with no evidence of aortic valve stenosis. Pulmonic Valve: The pulmonic valve was normal in structure. Pulmonic valve regurgitation is trivial. Aorta: The aortic root, ascending aorta and aortic arch are all structurally normal, with no evidence of dilitation or obstruction. IAS/Shunts: No atrial level shunt detected by color flow Doppler.  LEFT VENTRICLE PLAX 2D LVIDd:         5.66 cm  Diastology LVIDs:         4.00 cm  LV e' lateral:   5.77  cm/s LV PW:         0.77 cm  LV E/e' lateral: 24.8 LV IVS:        0.79 cm  LV e' medial:    5.22 cm/s LVOT diam:     2.20 cm  LV E/e' medial:  27.4 LV SV:         79.45 ml LV SV Index:   39.35 LVOT Area:     3.80 cm  RIGHT  VENTRICLE RV Basal diam:  5.19 cm RV S prime:     8.70 cm/s TAPSE (M-mode): 1.2 cm LEFT ATRIUM              Index       RIGHT ATRIUM           Index LA diam:        5.20 cm  2.44 cm/m  RA Area:     21.70 cm LA Vol (A2C):   102.0 ml 47.93 ml/m RA Volume:   71.80 ml  33.74 ml/m LA Vol (A4C):   71.5 ml  33.60 ml/m LA Biplane Vol: 91.9 ml  43.18 ml/m  AORTIC VALVE LVOT Vmax:   98.40 cm/s LVOT Vmean:  64.100 cm/s LVOT VTI:    0.209 m  AORTA Ao Root diam: 3.40 cm Ao Asc diam:  4.00 cm MITRAL VALVE                TRICUSPID VALVE MV Area (PHT): 3.48 cm     TR Peak grad:   70.2 mmHg MV Decel Time: 218 msec     TR Vmax:        419.00 cm/s MV E velocity: 143.00 cm/s MV A velocity: 49.00 cm/s   SHUNTS MV E/A ratio:  2.92         Systemic VTI:  0.21 m                             Systemic Diam: 2.20 cm Neoma Laming MD Electronically signed by Neoma Laming MD Signature Date/Time: 06/22/2019/8:25:48 AM    Final         Scheduled Meds: . aspirin EC  81 mg Oral Daily  . felodipine  7.5 mg Oral Daily  . ferrous sulfate  325 mg Oral Daily  . furosemide  40 mg Intravenous Q12H  . heparin  5,000 Units Subcutaneous Q8H  . hydrALAZINE  25 mg Oral TID  . insulin aspart  0-9 Units Subcutaneous Q4H  . insulin detemir  14 Units Subcutaneous q morning - 10a  . insulin detemir  20 Units Subcutaneous QHS  . ipratropium-albuterol  3 mL Nebulization Once  . loratadine  10 mg Oral Daily  . metoprolol tartrate  25 mg Oral BID  . multivitamin-lutein  1 capsule Oral Daily  . pravastatin  40 mg Oral q1800  . sodium chloride flush  3 mL Intravenous Q12H  . sodium chloride irrigation  30 mL Irrigation QPM   Continuous Infusions: . sodium chloride    .  ceFAZolin (ANCEF) IV      Assessment & Plan:     Principal Problem:   Acute on chronic diastolic CHF (congestive heart failure) (HCC) Active Problems:   CKD (chronic kidney disease) stage 4, GFR 15-29 ml/min (HCC)   HLD (hyperlipidemia)   Atrial flutter (HCC)   HTN (hypertension)   Type II diabetes mellitus with renal manifestations (HCC)   Iron deficiency anemia   Atherosclerosis of artery of extremity with ulceration (HCC)   Acute on chronic diastolic CHF (congestive heart failure) [facility]): Patient has 3+ leg edema, elevated BNP, chest x-ray showed pulmonary edema, consistent with CHF exacerbation. -Lasix 40 mg bid IV to continue.  Adjust in a.m. -trend trop -2d echo-EF 50 to 55%, EF is low normal function.  LV with global hypokinesis.  Grade 1 diastolic dysfunction.  Please see full report -Daily weights -strict I/O's -Low salt diet -Fluid restriction  CKD (chronic kidney disease) stage 4, GFR 15-29 ml/min (HCC): stable -f/u by BMP  HLD (hyperlipidemia): -Pravastatin  Atrial flutter ([place]); -Continue metoprolol -Hold Eliquis since patient may need a procedure when CHF is controlled  HTN:  -Continue home medications: Felodipine, hydralazine, metoprolol, -hydralazine prn  Type II diabetes mellitus with renal manifestations ([place]): Most recent A1c 7.1, poorly controled. Patient is taking NovoLog and Levemir at home -His a.m. blood glucose levels were low.  Will decrease a.m. Levemir dose to 9 units and decrease evening dose to 14 units.   -SSI  Iron deficiency anemia: Hemoglobin 9.7 (10.1 on 01/25/2018) -Continue iron supplement  Atherosclerosis of artery of extremity with ulceration ([place]): -angiography of theleftlower extremitywith the hope for intervention for limb salvage by VVS   DVT prophylaxis: Heparin Code Status: Full Family Communication: None at bedside Disposition Plan: Will likely discharge in 1 to 2 days once more euvolemic and there is no plans for vascular intervention during this  hospitalization.       LOS: 1 day   Time spent: 45 minutes with more than 50% COC    Nolberto Hanlon, MD [facility] Pager [phone]  If 7PM-7AM, please contact night-coverage [email or web address] Password [facility] 06/22/2019, 1:49 PM

## 2019-06-23 ENCOUNTER — Other Ambulatory Visit (INDEPENDENT_AMBULATORY_CARE_PROVIDER_SITE_OTHER): Payer: Self-pay | Admitting: Vascular Surgery

## 2019-06-23 LAB — GLUCOSE, CAPILLARY
Glucose-Capillary: 108 mg/dL — ABNORMAL HIGH (ref 70–99)
Glucose-Capillary: 123 mg/dL — ABNORMAL HIGH (ref 70–99)
Glucose-Capillary: 147 mg/dL — ABNORMAL HIGH (ref 70–99)
Glucose-Capillary: 214 mg/dL — ABNORMAL HIGH (ref 70–99)
Glucose-Capillary: 228 mg/dL — ABNORMAL HIGH (ref 70–99)
Glucose-Capillary: 47 mg/dL — ABNORMAL LOW (ref 70–99)
Glucose-Capillary: 80 mg/dL (ref 70–99)

## 2019-06-23 LAB — BASIC METABOLIC PANEL
Anion gap: 7 (ref 5–15)
BUN: 57 mg/dL — ABNORMAL HIGH (ref 8–23)
CO2: 23 mmol/L (ref 22–32)
Calcium: 7.9 mg/dL — ABNORMAL LOW (ref 8.9–10.3)
Chloride: 108 mmol/L (ref 98–111)
Creatinine, Ser: 1.87 mg/dL — ABNORMAL HIGH (ref 0.61–1.24)
GFR calc Af Amer: 37 mL/min — ABNORMAL LOW (ref >60)
GFR calc non Af Amer: 32 mL/min — ABNORMAL LOW (ref >60)
Glucose, Bld: 119 mg/dL — ABNORMAL HIGH (ref 70–99)
Potassium: 4.4 mmol/L (ref 3.5–5.1)
Sodium: 138 mmol/L (ref 135–145)

## 2019-06-23 LAB — BRAIN NATRIURETIC PEPTIDE: B Natriuretic Peptide: 341 pg/mL — ABNORMAL HIGH (ref 0.0–100.0)

## 2019-06-23 MED ORDER — MIDAZOLAM HCL 2 MG/ML PO SYRP
8.0000 mg | ORAL_SOLUTION | Freq: Once | ORAL | Status: DC | PRN
  Filled 2019-06-23: qty 4

## 2019-06-23 MED ORDER — ONDANSETRON HCL 4 MG/2ML IJ SOLN: 4.0000 mg | Freq: Four times a day (QID) | INTRAMUSCULAR | Status: DC | PRN

## 2019-06-23 MED ORDER — DIPHENHYDRAMINE HCL 50 MG/ML IJ SOLN: 50.0000 mg | Freq: Once | INTRAMUSCULAR | Status: DC | PRN

## 2019-06-23 MED ORDER — SODIUM CHLORIDE 0.9 % IV SOLN: INTRAVENOUS | Status: DC

## 2019-06-23 MED ORDER — INSULIN DETEMIR 100 UNIT/ML ~~LOC~~ SOLN
10.0000 [IU] | Freq: Every morning | SUBCUTANEOUS | Status: DC
  Administered 2019-06-23 – 2019-07-01 (×8): 10 [IU] via SUBCUTANEOUS
  Filled 2019-06-23 (×10): qty 0.1

## 2019-06-23 MED ORDER — HYDROMORPHONE HCL 1 MG/ML IJ SOLN: 1.0000 mg | Freq: Once | INTRAMUSCULAR | Status: DC | PRN

## 2019-06-23 MED ORDER — METHYLPREDNISOLONE SODIUM SUCC 125 MG IJ SOLR: 125.0000 mg | Freq: Once | INTRAMUSCULAR | Status: DC | PRN

## 2019-06-23 MED ORDER — CEFAZOLIN SODIUM-DEXTROSE 2-4 GM/100ML-% IV SOLN
2.0000 g | INTRAVENOUS | Status: DC
  Filled 2019-06-23: qty 100

## 2019-06-23 MED ORDER — CEFAZOLIN SODIUM-DEXTROSE 1-4 GM/50ML-% IV SOLN
1.0000 g | INTRAVENOUS | Status: DC
  Filled 2019-06-23: qty 50

## 2019-06-23 MED ORDER — FAMOTIDINE 20 MG PO TABS: 40.0000 mg | ORAL_TABLET | Freq: Once | ORAL | Status: DC | PRN

## 2019-06-23 MED ORDER — INSULIN DETEMIR 100 UNIT/ML ~~LOC~~ SOLN
5.0000 [IU] | Freq: Every day | SUBCUTANEOUS | Status: DC
  Filled 2019-06-23: qty 0.05

## 2019-06-23 NOTE — Care Management Important Message (Signed)
Important Message  Patient Details  Name: Miguel Torres MRN: [id number] Date of Birth: 02/15/1931   Medicare Important Message Given:  Yes     Dannette Barbara 06/23/2019, 12:13 PM

## 2019-06-23 NOTE — Progress Notes (Signed)
Inpatient Diabetes Program Recommendations  AACE/ADA: New Consensus Statement on Inpatient Glycemic Control   Target Ranges:  Prepandial:   less than 140 mg/dL      Peak postprandial:   less than 180 mg/dL (1-2 hours)      Critically ill patients:  140 - 180 mg/dL   Results for FALON, FLINCHUM (MRN [id number]) as of 06/23/2019 10:34  Ref. Range 06/22/2019 03:58 06/22/2019 08:10 06/22/2019 08:36 06/22/2019 11:44 06/22/2019 16:33 06/22/2019 20:06 06/22/2019 23:33 06/23/2019 04:20 06/23/2019 07:58 06/23/2019 08:29 06/23/2119 10:20  Glucose-Capillary Latest Ref Range: 70 - 99 mg/dL 69 (L) 52 (L) 67 (L) 141 (H)  Novolog 1 units 206 (H)  Novolog 3 units 230 (H)  Novolog 3 units  Levemir 9 units 220 (H)  Novolog 3 units 108 (H) 47 (L) 80    Levemir 10 units   Review of Glycemic Control  Diabetes history: DM2 Outpatient Diabetes medications: Novlog 3-11 units TID with meals, Levemir 22 units QAM, Levemir 32 units QHS Current orders for Inpatient glycemic control: Levemir 10 units QAM, Levemir 5 units QHS, Novolog 0-9 units Q4H  Inpatient Diabetes Program Recommendations:   Insulin - Basal: Fasting glucose 47 mg/dl this morning. Levemir 10 units has already been given this morning. Concerned about hypoglycemia today since Levemir already given.  Please discontinue Levemir 5 units QHS and decrease am Levemir to 5 units QAM (to start 06/24/19).  Correction (SSI): If patient is eating, please change frequency of CBGs to AC&HS and Novolog 0-9 units to AC&HS.  NOTE: Patient only received Levemir 9 units on 06/22/19 at 21:03 and fasting glucose 47 mg/dl today.  Patient has already received Levemir 10 units this morning.  Thanks, Barnie Alderman, RN, MSN, CDE Diabetes Coordinator Inpatient Diabetes Program [phone] (Team Pager from 8am to 5pm)

## 2019-06-23 NOTE — Progress Notes (Signed)
CBG is 47. 2 orange juice given and will recheck in 15 minutes. I will continue to assess.

## 2019-06-23 NOTE — Progress Notes (Signed)
Reds vest unsuccessful 2/2 habitus. I will continue to assess.

## 2019-06-23 NOTE — Progress Notes (Signed)
Hypoglycemic Event  CBG: 47  Treatment: 2 orange juice  Symptoms: none  Follow-up CBG: Time:0829 CBG Result:80  Possible Reasons for Event: unknown  Comments/MD notified:yes, levemir adjusted    Miguel Torres C [name]

## 2019-06-23 NOTE — Progress Notes (Signed)
PROGRESS NOTE    Miguel Torres  XHB:[id number] DOB: 1930-05-23 DOA: 06/21/2019 PCP: [name], MD    Brief Narrative:  Miguel Torres is a 87 y.o. male with medical history significant of hypertension, hyperlipidemia, diabetes mellitus, dCHF, iron deficiency anemia, CKD-4, s/p of right BKA, atrial flutter on Eliquis, who presents with shortness breath, leg edema.   Pt has hx of atherosclerosis of native arteries of the extremities with ulceration. He is scheduled for angiography of theleftlower extremitywith the hope for intervention for limb salvage by VVS, however pt is found to have SOB and severe leg edema, concerning for CHF exacerbation. The procedure is canceled and we are asked to treat his CHF exacerbation.  Patient states that he has dry cough, shortness of breath, no chest pain, fever or chills.  No nausea vomiting, diarrhea, abdominal pain, symptoms of UTI or unilateral weakness.  He has a severe bilateral leg edema.    Consultants:   None  Procedures: None  Antimicrobials:   None   Subjective: Blood glucose level was 47 this AM.  Patient was given OJ x2 with blood glucose in the 80s.  Currently sitting in bed without any complaints.  Good urine output  Objective: Vitals:   06/22/19 1935 06/22/19 2332 06/23/19 0417 06/23/19 0737  BP: (!) 117/55 126/65 123/62 139/75  Pulse: 64 73 (!) 59 68  Resp: 20 20 20 19   Temp: (!) 97.5 F (36.4 C) 97.8 F (36.6 C) 97.7 F (36.5 C) 98.2 F (36.8 C)  TempSrc: Oral Oral Oral   SpO2: 98% 95% 96% 96%  Weight:   112.3 kg   Height:        Intake/Output Summary (Last 24 hours) at 06/23/2019 0746 Last data filed at 06/23/2019 0441 Gross per 24 hour  Intake --  Output 2550 ml  Net -2550 ml   Filed Weights   06/21/19 0931 06/22/19 0356 06/23/19 0417  Weight: 99.8 kg 110.7 kg 112.3 kg    Examination:  General exam: Appears calm and comfortable, NAD Respiratory system: Clear to auscultation.  Respiratory effort normal.  No wheezing or rails Cardiovascular system: S1 & S2 heard, RRR.  No murmurs, rubs, gallops or clicks.  Gastrointestinal system: Abdomen is nondistended, soft and nontender. Normal bowel sounds heard. Central nervous system: Alert and oriented.  Grossly intact Extremities: Mild bilateral edema with chronic skin changes, right BKA Skin: Warm dry Psychiatry:  Mood & affect appropriate for current setting.     Data Reviewed: I have personally reviewed following labs and imaging studies  CBC: Recent Labs  Lab 06/21/19 1453  WBC 8.1  HGB 9.7*  HCT 30.2*  MCV 96.8  PLT 810   Basic Metabolic Panel: Recent Labs  Lab 06/21/19 0936 06/21/19 1453 06/22/19 0506 06/23/19 0402  NA  --  138 138 138  K  --  4.7 4.5 4.4  CL  --  108 107 108  CO2  --  19* 22 23  GLUCOSE  --  179* 81 119*  BUN 43* 45* 48* 57*  CREATININE 1.73* 1.67* 1.71* 1.87*  CALCIUM  --  8.1* 8.1* 7.9*   GFR: Estimated Creatinine Clearance: 34.5 mL/min (A) (by C-G formula based on SCr of 1.87 mg/dL (H)). Liver Function Tests: No results for input(s): AST, ALT, ALKPHOS, BILITOT, PROT, ALBUMIN in the last 168 hours. No results for input(s): LIPASE, AMYLASE in the last 168 hours. No results for input(s): AMMONIA in the last 168 hours. Coagulation Profile: No results for  input(s): INR, PROTIME in the last 168 hours. Cardiac Enzymes: No results for input(s): CKTOTAL, CKMB, CKMBINDEX, TROPONINI in the last 168 hours. BNP (last 3 results) No results for input(s): PROBNP in the last 8760 hours. HbA1C: No results for input(s): HGBA1C in the last 72 hours. CBG: Recent Labs  Lab 06/22/19 1144 06/22/19 1633 06/22/19 2006 06/22/19 2333 06/23/19 0420  GLUCAP 141* 206* 230* 220* 108*   Lipid Profile: No results for input(s): CHOL, HDL, LDLCALC, TRIG, CHOLHDL, LDLDIRECT in the last 72 hours. Thyroid Function Tests: No results for input(s): TSH, T4TOTAL, FREET4, T3FREE, THYROIDAB in the  last 72 hours. Anemia Panel: No results for input(s): VITAMINB12, FOLATE, FERRITIN, TIBC, IRON, RETICCTPCT in the last 72 hours. Sepsis Labs: No results for input(s): PROCALCITON, LATICACIDVEN in the last 168 hours.  Recent Results (from the past 240 hour(s))  SARS CORONAVIRUS 2 (TAT 6-24 HRS) Nasopharyngeal Nasopharyngeal Swab     Status: None   Collection Time: 06/20/19  9:42 AM   Specimen: Nasopharyngeal Swab  Result Value Ref Range Status   SARS Coronavirus 2 NEGATIVE NEGATIVE Final    Comment: (NOTE) SARS-CoV-2 target nucleic acids are NOT DETECTED. The SARS-CoV-2 RNA is generally detectable in upper and lower respiratory specimens during the acute phase of infection. Negative results do not preclude SARS-CoV-2 infection, do not rule out co-infections with other pathogens, and should not be used as the sole basis for treatment or other patient management decisions. Negative results must be combined with clinical observations, patient history, and epidemiological information. The expected result is Negative. Fact Sheet for Patients: SugarRoll.be Fact Sheet for Healthcare Providers: https://www.woods-mathews.com/ This test is not yet approved or cleared by the [place] FDA and  has been authorized for detection and/or diagnosis of SARS-CoV-2 by FDA under an Emergency Use Authorization (EUA). This EUA will remain  in effect (meaning this test can be used) for the duration of the COVID-19 declaration under Section 56 4(b)(1) of the Act, 21 U.S.C. section 360bbb-3(b)(1), unless the authorization is terminated or revoked sooner. Performed at [facility], [place] [address], [place], [place] [address]          Radiology Studies: DG Chest Port 1 View  Result Date: 06/21/2019 CLINICAL DATA:  Dyspnea, CHF EXAM: PORTABLE CHEST 1 VIEW COMPARISON:  12/17/2017 chest radiograph. FINDINGS: Stable cardiomediastinal silhouette with mild  cardiomegaly. No pneumothorax. Trace bilateral pleural effusions. Mild diffuse prominence of the parahilar interstitial markings. IMPRESSION: Mild congestive heart failure with trace bilateral pleural effusions. Electronically Signed   By: Ilona Sorrel M.D.   On: 06/21/2019 10:06   ECHOCARDIOGRAM COMPLETE  Result Date: 06/22/2019    ECHOCARDIOGRAM REPORT   Patient Name:   CARLAS VANDYNE Date of Exam: 06/21/2019 Medical Rec #:  [id number]          Height:       68.0 in Accession #:    [id number]         Weight:       220.0 lb Date of Birth:  08-09-1930           BSA:          2.13 m Patient Age:    35 years           BP:           143/102 mmHg Patient Gender: M                  HR:  64 bpm. Exam Location:  [facility] Procedure: 2D Echo, Cardiac Doppler and Color Doppler Indications:     Z66.06 Acute Diastolic Congestive heart failure  History:         Patient has prior history of Echocardiogram examinations, most                  recent 11/02/2017. Risk Factors:Hypertension, Diabetes and                  Dyslipidemia. Anemia. Congestive heart failure.  Sonographer:     Wilford Sports Rodgers-Jones Referring Phys:  [place] Diagnosing Phys: Neoma Laming MD IMPRESSIONS  1. Left ventricular ejection fraction, by estimation, is 50 to 55%. The left ventricle has low normal function. The left ventricle demonstrates global hypokinesis. The left ventricular internal cavity size was mildly to moderately dilated. There is mild  concentric left ventricular hypertrophy. Left ventricular diastolic parameters are consistent with Grade I diastolic dysfunction (impaired relaxation).  2. Right ventricular systolic function is moderately reduced. The right ventricular size is moderately enlarged. There is severely elevated pulmonary artery systolic pressure.  3. Left atrial size was mild to moderately dilated.  4. Right atrial size was mild to moderately dilated.  5. The mitral valve is degenerative. Trivial mitral valve  regurgitation.  6. The tricuspid valve is degenerative. Tricuspid valve regurgitation is mild to moderate.  7. The aortic valve is tricuspid. Aortic valve regurgitation is trivial. Mild aortic valve sclerosis is present, with no evidence of aortic valve stenosis. FINDINGS  Left Ventricle: Left ventricular ejection fraction, by estimation, is 50 to 55%. The left ventricle has low normal function. The left ventricle demonstrates global hypokinesis. The left ventricular internal cavity size was mildly to moderately dilated. There is mild concentric left ventricular hypertrophy. Left ventricular diastolic parameters are consistent with Grade I diastolic dysfunction (impaired relaxation). Right Ventricle: The right ventricular size is moderately enlarged. No increase in right ventricular wall thickness. Right ventricular systolic function is moderately reduced. There is severely elevated pulmonary artery systolic pressure. The tricuspid regurgitant velocity is 4.19 m/s, and with an assumed right atrial pressure of 10 mmHg, the estimated right ventricular systolic pressure is 30.1 mmHg. Left Atrium: Left atrial size was mild to moderately dilated. Right Atrium: Right atrial size was mild to moderately dilated. Pericardium: Trivial pericardial effusion is present. Mitral Valve: The mitral valve is degenerative in appearance. Moderate mitral annular calcification. Trivial mitral valve regurgitation. Tricuspid Valve: The tricuspid valve is degenerative in appearance. Tricuspid valve regurgitation is mild to moderate. Aortic Valve: The aortic valve is tricuspid. Aortic valve regurgitation is trivial. Mild aortic valve sclerosis is present, with no evidence of aortic valve stenosis. Pulmonic Valve: The pulmonic valve was normal in structure. Pulmonic valve regurgitation is trivial. Aorta: The aortic root, ascending aorta and aortic arch are all structurally normal, with no evidence of dilitation or obstruction. IAS/Shunts: No  atrial level shunt detected by color flow Doppler.  LEFT VENTRICLE PLAX 2D LVIDd:         5.66 cm  Diastology LVIDs:         4.00 cm  LV e' lateral:   5.77 cm/s LV PW:         0.77 cm  LV E/e' lateral: 24.8 LV IVS:        0.79 cm  LV e' medial:    5.22 cm/s LVOT diam:     2.20 cm  LV E/e' medial:  27.4 LV SV:  79.45 ml LV SV Index:   39.35 LVOT Area:     3.80 cm  RIGHT VENTRICLE RV Basal diam:  5.19 cm RV S prime:     8.70 cm/s TAPSE (M-mode): 1.2 cm LEFT ATRIUM              Index       RIGHT ATRIUM           Index LA diam:        5.20 cm  2.44 cm/m  RA Area:     21.70 cm LA Vol (A2C):   102.0 ml 47.93 ml/m RA Volume:   71.80 ml  33.74 ml/m LA Vol (A4C):   71.5 ml  33.60 ml/m LA Biplane Vol: 91.9 ml  43.18 ml/m  AORTIC VALVE LVOT Vmax:   98.40 cm/s LVOT Vmean:  64.100 cm/s LVOT VTI:    0.209 m  AORTA Ao Root diam: 3.40 cm Ao Asc diam:  4.00 cm MITRAL VALVE                TRICUSPID VALVE MV Area (PHT): 3.48 cm     TR Peak grad:   70.2 mmHg MV Decel Time: 218 msec     TR Vmax:        419.00 cm/s MV E velocity: 143.00 cm/s MV A velocity: 49.00 cm/s   SHUNTS MV E/A ratio:  2.92         Systemic VTI:  0.21 m                             Systemic Diam: 2.20 cm Neoma Laming MD Electronically signed by Neoma Laming MD Signature Date/Time: 06/22/2019/8:25:48 AM    Final         Scheduled Meds: . aspirin EC  81 mg Oral Daily  . felodipine  7.5 mg Oral Daily  . ferrous sulfate  325 mg Oral Daily  . heparin  5,000 Units Subcutaneous Q8H  . hydrALAZINE  25 mg Oral TID  . insulin aspart  0-9 Units Subcutaneous Q4H  . insulin detemir  14 Units Subcutaneous q morning - 10a  . insulin detemir  9 Units Subcutaneous QHS  . ipratropium-albuterol  3 mL Nebulization Once  . loratadine  10 mg Oral Daily  . metoprolol tartrate  25 mg Oral BID  . multivitamin-lutein  1 capsule Oral Daily  . pravastatin  40 mg Oral q1800  . sodium chloride flush  3 mL Intravenous Q12H  . sodium chloride irrigation  30 mL  Irrigation QPM   Continuous Infusions: . sodium chloride    .  ceFAZolin (ANCEF) IV      Assessment & Plan:   Principal Problem:   Acute on chronic diastolic CHF (congestive heart failure) (HCC) Active Problems:   CKD (chronic kidney disease) stage 4, GFR 15-29 ml/min (HCC)   HLD (hyperlipidemia)   Atrial flutter (HCC)   HTN (hypertension)   Type II diabetes mellitus with renal manifestations (HCC)   Iron deficiency anemia   Atherosclerosis of artery of extremity with ulceration (HCC)   Acute on chronic diastolic CHF (congestive heart failure) [facility]): Patient had 3+ leg edema, elevated BNP, chest x-ray showed pulmonary edema, consistent with CHF exacerbation. Was on lasix iv 40 mg bid , will d/c as creatinine increased and he appears to be more euvolemic today. -ck bnp -2d echo-EF 50 to 55%, EF is low normal function.  LV with global hypokinesis.  Grade 1 diastolic dysfunction.  Please see full report -Daily weights -strict I/O's -Low salt diet -Fluid restriction  CKD (chronic kidney disease) stage 4, GFR 15-29 ml/min ([place]):  BUN and creatinine mildly elevated. Will DC Lasix IV today.   May need to resume Lasix 40 mEq p.o. starting in a.m.  HLD (hyperlipidemia): -Pravastatin  Atrial flutter ([place]); -Continue metoprolol -Hold Eliquis since patient may need a procedure when CHF is controlled  HTN:  -Continue home medications: Felodipine, hydralazine, metoprolol, -hydralazine prn  Type II diabetes mellitus with renal manifestations ([place]): Most recent A1c 7.1, poorly controled. Patient is taking NovoLog and Levemir at home -This a.m. he was hypoglycemic .  Will decrease a.m. Levemir dose from 14 to 10 units and discontinue pm insulin dose -SSI -ck fs  Iron deficiency anemia: Hemoglobin 9.7 (10.1 on 01/25/2018) -Continue iron supplement  Atherosclerosis of artery of extremity with ulceration ([place]): -angiography of theleftlower extremitywith the hope for  intervention for limb salvage by VVS.  Will reach out to Dr. Delana Meyer today to see if any plans for angiography as inpt.   DVT prophylaxis: Heparin Code Status: Full Family Communication: None at bedside Disposition Plan: Will likely discharge in 1 to 2 days once more euvolemic and there is no plans for vascular intervention during this hospitalization.       LOS: 2 days   Time spent: 45 minutes with more than 50% COC    Nolberto Hanlon, MD [facility] Pager [phone]  If 7PM-7AM, please contact night-coverage [email or web address] Password [facility] 06/23/2019, 7:46 AM Patient ID: CHESTON COURY, male   DOB: 1930-09-09, 86 y.o.   MRN: [id number]

## 2019-06-24 ENCOUNTER — Encounter
Admission: AD | Disposition: A | Discharge status: Skilled Nursing Facility | Payer: Self-pay | Source: Home / Self Care | Attending: Internal Medicine

## 2019-06-24 DIAGNOSIS — I70262 Atherosclerosis of native arteries of extremities with gangrene, left leg: Secondary | ICD-10-CM

## 2019-06-24 HISTORY — PX: LOWER EXTREMITY ANGIOGRAPHY: CATH118251

## 2019-06-24 LAB — GLUCOSE, CAPILLARY
Glucose-Capillary: 98 mg/dL (ref 70–99)
Glucose-Capillary: 99 mg/dL (ref 70–99)
Glucose-Capillary: 81 mg/dL (ref 70–99)
Glucose-Capillary: 70 mg/dL (ref 70–99)
Glucose-Capillary: 104 mg/dL — ABNORMAL HIGH (ref 70–99)
Glucose-Capillary: 99 mg/dL (ref 70–99)
Glucose-Capillary: 92 mg/dL (ref 70–99)

## 2019-06-24 LAB — BASIC METABOLIC PANEL
Anion gap: 6 (ref 5–15)
BUN: 55 mg/dL — ABNORMAL HIGH (ref 8–23)
CO2: 25 mmol/L (ref 22–32)
Calcium: 7.9 mg/dL — ABNORMAL LOW (ref 8.9–10.3)
Chloride: 107 mmol/L (ref 98–111)
Creatinine, Ser: 1.81 mg/dL — ABNORMAL HIGH (ref 0.61–1.24)
GFR calc Af Amer: 38 mL/min — ABNORMAL LOW (ref >60)
GFR calc non Af Amer: 33 mL/min — ABNORMAL LOW (ref >60)
Glucose, Bld: 98 mg/dL (ref 70–99)
Potassium: 4.4 mmol/L (ref 3.5–5.1)
Sodium: 138 mmol/L (ref 135–145)

## 2019-06-24 LAB — CBC
HCT: 29.9 % — ABNORMAL LOW (ref 39.0–52.0)
Hemoglobin: 9.2 g/dL — ABNORMAL LOW (ref 13.0–17.0)
MCH: 30.2 pg (ref 26.0–34.0)
MCHC: 30.8 g/dL (ref 30.0–36.0)
MCV: 98 fL (ref 80.0–100.0)
Platelets: 218 10*3/uL (ref 150–400)
RBC: 3.05 MIL/uL — ABNORMAL LOW (ref 4.22–5.81)
RDW: 13.5 % (ref 11.5–15.5)
WBC: 6.4 10*3/uL (ref 4.0–10.5)
nRBC: 0 % (ref 0.0–0.2)

## 2019-06-24 LAB — MAGNESIUM: Magnesium: 2.2 mg/dL (ref 1.7–2.4)

## 2019-06-24 SURGERY — LOWER EXTREMITY ANGIOGRAPHY
Anesthesia: Moderate Sedation | Laterality: Left

## 2019-06-24 MED ORDER — OXYCODONE HCL 5 MG PO TABS
5.0000 mg | ORAL_TABLET | ORAL | Status: DC | PRN
  Administered 2019-06-30: 5 mg via ORAL
  Filled 2019-06-24: qty 1

## 2019-06-24 MED ORDER — SODIUM CHLORIDE 0.9 % IV SOLN: 250.0000 mL | INTRAVENOUS | Status: DC | PRN

## 2019-06-24 MED ORDER — FENTANYL CITRATE (PF) 100 MCG/2ML IJ SOLN
INTRAMUSCULAR | Status: AC
  Filled 2019-06-24: qty 2

## 2019-06-24 MED ORDER — HEPARIN SODIUM (PORCINE) 5000 UNIT/ML IJ SOLN
5000.0000 [IU] | Freq: Three times a day (TID) | INTRAMUSCULAR | Status: DC
  Administered 2019-06-25 (×2): 5000 [IU] via SUBCUTANEOUS
  Filled 2019-06-24 (×2): qty 1

## 2019-06-24 MED ORDER — IODIXANOL 320 MG/ML IV SOLN
INTRAVENOUS | Status: DC | PRN
  Administered 2019-06-24: 21:00:00 45 mL

## 2019-06-24 MED ORDER — MIDAZOLAM HCL 2 MG/2ML IJ SOLN
INTRAMUSCULAR | Status: AC
  Filled 2019-06-24: qty 2

## 2019-06-24 MED ORDER — SODIUM CHLORIDE 0.9% FLUSH
3.0000 mL | Freq: Two times a day (BID) | INTRAVENOUS | Status: DC
  Administered 2019-06-25 – 2019-07-01 (×14): 3 mL via INTRAVENOUS

## 2019-06-24 MED ORDER — MIDAZOLAM HCL 2 MG/2ML IJ SOLN
INTRAMUSCULAR | Status: DC | PRN
  Administered 2019-06-24 (×2): 1 mg via INTRAVENOUS

## 2019-06-24 MED ORDER — FENTANYL CITRATE (PF) 100 MCG/2ML IJ SOLN
INTRAMUSCULAR | Status: DC | PRN
  Administered 2019-06-24 (×2): 50 ug via INTRAVENOUS

## 2019-06-24 MED ORDER — SODIUM CHLORIDE 0.9 % IV SOLN: INTRAVENOUS | Status: AC

## 2019-06-24 MED ORDER — DEXTROSE-NACL 5-0.45 % IV SOLN: INTRAVENOUS | Status: DC

## 2019-06-24 MED ORDER — HEPARIN SODIUM (PORCINE) 1000 UNIT/ML IJ SOLN
INTRAMUSCULAR | Status: AC
  Filled 2019-06-24: qty 1

## 2019-06-24 MED ORDER — DEXTROSE 5 % IV SOLN: 1000.0000 mg | Freq: Once | INTRAVENOUS | Status: DC

## 2019-06-24 MED ORDER — SODIUM CHLORIDE 0.9% FLUSH: 3.0000 mL | INTRAVENOUS | Status: DC | PRN

## 2019-06-24 MED ORDER — HEPARIN SODIUM (PORCINE) 1000 UNIT/ML IJ SOLN
INTRAMUSCULAR | Status: DC | PRN
  Administered 2019-06-24: 5000 [IU] via INTRAVENOUS

## 2019-06-24 MED ORDER — ONDANSETRON HCL 4 MG/2ML IJ SOLN: 4.0000 mg | Freq: Four times a day (QID) | INTRAMUSCULAR | Status: DC | PRN

## 2019-06-24 SURGICAL SUPPLY — 18 items
CATH CXI SUPP ANG 4FR 135 (CATHETERS) ×1 IMPLANT
CATH CXI SUPP ANG 4FR 135CM (CATHETERS) ×3
CATH PIG 70CM (CATHETERS) ×3 IMPLANT
CATH SEEKER .035X135CM (CATHETERS) ×3 IMPLANT
CATH VERT 5FR 125CM (CATHETERS) ×3 IMPLANT
COVER PROBE U/S 5X48 (MISCELLANEOUS) ×3 IMPLANT
DEVICE PRESTO INFLATION (MISCELLANEOUS) IMPLANT
DEVICE STARCLOSE SE CLOSURE (Vascular Products) ×3 IMPLANT
DEVICE TORQUE .025-.038 (MISCELLANEOUS) ×3 IMPLANT
GLIDEWIRE ADV .035X260CM (WIRE) ×3 IMPLANT
NEEDLE ENTRY 21GA 7CM ECHOTIP (NEEDLE) ×3 IMPLANT
PACK ANGIOGRAPHY (CUSTOM PROCEDURE TRAY) ×3 IMPLANT
SET INTRO CAPELLA COAXIAL (SET/KITS/TRAYS/PACK) ×3 IMPLANT
SHEATH BRITE TIP 5FRX11 (SHEATH) ×3 IMPLANT
SHEATH RAABE 6FR (SHEATH) ×3 IMPLANT
SYR MEDRAD MARK 7 150ML (SYRINGE) ×3 IMPLANT
TUBING CONTRAST HIGH PRESS 72 (TUBING) ×3 IMPLANT
WIRE J 3MM .035X145CM (WIRE) ×3 IMPLANT

## 2019-06-24 NOTE — Op Note (Signed)
[facility]  Percutaneous Study/Intervention Procedural Note   Date of Surgery: 06/24/2019,9:07 PM  Surgeon:Lennan Malone, Dolores Lory   Pre-operative Diagnosis: Atherosclerotic occlusive disease left lower extremity with ulceration  Post-operative diagnosis:  Same  Procedure(s) Performed:  1.  Abdominal aortogram  2.  Left lower extremity angiography third order catheter placement  3.  Star close right common femoral   Anesthesia: Conscious sedation was administered by the interventional radiology RN under my direct supervision. IV Versed plus fentanyl were utilized. Continuous ECG, pulse oximetry and blood pressure was monitored throughout the entire procedure.  Conscious sedation was administered for a total of 30 minutes.  Sheath: 6 [place] Raby right common femoral retrograde  Contrast: 45 cc   Fluoroscopy Time: 9.7 minutes  Indications:  The patient presents to [facility] with gangrenous changes to the left ankle.  Pedal pulses are nonpalpable bilaterally suggesting atherosclerotic occlusive disease.  The risks and benefits as well as alternative therapies for lower extremity revascularization are reviewed with the patient all questions are answered the patient agrees to proceed.  The patient is therefore undergoing angiography with the hope for intervention for limb salvage.   Procedure:  Miguel Torres a 86 y.o. male who was identified and appropriate procedural time out was performed.  The patient was then placed supine on the table and prepped and draped in the usual sterile fashion.  Ultrasound was used to evaluate the right common femoral artery.  It was echolucent and pulsatile indicating it is patent .  An ultrasound image was acquired for the permanent record.  A micropuncture needle was used to access the right common femoral artery under direct ultrasound guidance.  The microwire was then advanced under fluoroscopic guidance without difficulty  followed by the micro-sheath.  A 0.035 J wire was advanced without resistance and a 5Fr sheath was placed.    Pigtail catheter was then advanced to the level of T12 and AP projection of the aorta was obtained. Pigtail catheter was then repositioned to above the bifurcation and LAO view of the pelvis was obtained. Stiff angled Glidewire and pigtail catheter was then used across the bifurcation and the catheter was positioned in the distal external iliac artery.  LAO of the left groin was then obtained. Wire was reintroduced and negotiated into the SFA and the catheter was advanced into the SFA and then down to the popliteal. Distal runoff was then performed.  Diagnostic interpretation: The abdominal aorta is opacified with a bolus injection contrast.  There are no hemodynamically significant stenoses noted there is some fairly mild atherosclerotic changes identified.  Bilateral nephrograms are noted single renal arteries renal artery stenosis of hemodynamic significance is not identified.  The bilateral common external iliac arteries are widely patent.  The left common femoral profunda femoris and superficial femoral artery are widely patent there is some calcific atherosclerotic changes but there is essentially no significant stenosis.  Just below the knee in the popliteal there is a focal 60 to 70% stenosis.  Below that the popliteal is patent the trifurcation however is heavily diseased the anterior tibial occludes 2 cm beyond its origin remains occluded for approximately 10 to 12 cm.  It reconstitutes toward the mid calf but from the point of reconstitution to the ankle there are numerous greater than 90% stenoses.  The dorsalis pedis is a thread with diffuse greater than 70 to 80% stenosis.  There is poor filling of the pedal arch.  The tibioperoneal trunk posterior tibial and peroneal  are nonvisualized throughout their entire courses.  At this point I advanced a Raby sheath up and over the bifurcation  using the advantage wire and positioned the Raby in the SFA.  I then used several different catheters with the advantage wire.  I was unable to cross the occlusion of the anterior tibial.    After review of the images the catheter was removed over wire and an RAO view of the groin was obtained. StarClose device was deployed without difficulty.   Findings:  The abdominal aorta is opacified with a bolus injection contrast.  There are no hemodynamically significant stenoses noted there is some fairly mild atherosclerotic changes identified.  Bilateral nephrograms are noted single renal arteries renal artery stenosis of hemodynamic significance is not identified.  The bilateral common external iliac arteries are widely patent.  The left common femoral profunda femoris and superficial femoral artery are widely patent there is some calcific atherosclerotic changes but there is essentially no significant stenosis.  Just below the knee in the popliteal there is a focal 60 to 70% stenosis.  Below that the popliteal is patent the trifurcation however is heavily diseased the anterior tibial occludes 2 cm beyond its origin remains occluded for approximately 10 to 12 cm.  It reconstitutes toward the mid calf but from the point of reconstitution to the ankle there are numerous greater than 90% stenoses.  The dorsalis pedis is a thread with diffuse greater than 70 to 80% stenosis.  There is poor filling of the pedal arch.  The tibioperoneal trunk posterior tibial and peroneal are nonvisualized throughout their entire courses.    At this point we used approximately 45 to 50 cc of dye rather than continue with a situation that did not seem to be moving forward I elected to terminate the procedure rather than increase the amount of contrast and risk worsening kidney function.  I have discussed this with the patient's daughter by phone.  My plan would be to allow the patient to recover for a week or 2 and then certainly we  could try again this time we could essentially focus solely on the anterior tibial and not have to use dye for any of the more proximal diagnostic imaging.  Perhaps atherectomy would be more effective.  Disposition: Patient was taken to the recovery room in stable condition having tolerated the procedure well.  Miguel Torres 06/24/2019,9:07 PM

## 2019-06-24 NOTE — Progress Notes (Signed)
MD notified. Pts CBG is 70 after being NPO all day for procedure. MD orders D5 1/2 NS at 50 until after procedure and no longer NPO. I will continue to assess.

## 2019-06-24 NOTE — Progress Notes (Signed)
PROGRESS NOTE    Miguel Torres  HWE:[id number] DOB: 08/10/1930 DOA: 06/21/2019 PCP: Miguel Sons, MD    Brief Narrative:  Miguel Torres is a 86 y.o. male with medical history significant of hypertension, hyperlipidemia, diabetes mellitus, dCHF, iron deficiency anemia, CKD-4, s/p of right BKA, atrial flutter on Eliquis, who presents with shortness breath, leg edema.   Pt has hx of atherosclerosis of native arteries of the extremities with ulceration. He is scheduled for angiography of theleftlower extremitywith the hope for intervention for limb salvage by VVS, however pt is found to have SOB and severe leg edema, concerning for CHF exacerbation. The procedure is canceled and we are asked to treat his CHF exacerbation.  Patient states that he has dry cough, shortness of breath, no chest pain, fever or chills.  No nausea vomiting, diarrhea, abdominal pain, symptoms of UTI or unilateral weakness.  He has a severe bilateral leg edema.    Consultants:   None  Procedures: None  Antimicrobials:   None   Subjective: Pt 9 sob, cp, or any other sx.   Objective: Vitals:   06/23/19 1933 06/24/19 0356 06/24/19 0756 06/24/19 1159  BP: 135/75 (!) 152/69 (!) 142/94 138/70  Pulse: 81 74 79 67  Resp: 20 20    Temp: (!) 97.5 F (36.4 C) 97.6 F (36.4 C) 97.7 F (36.5 C) 97.8 F (36.6 C)  TempSrc: Oral Oral Oral Oral  SpO2: 97% 95% 97% 94%  Weight:  111.8 kg    Height:        Intake/Output Summary (Last 24 hours) at 06/24/2019 1449 Last data filed at 06/24/2019 0359 Gross per 24 hour  Intake 240 ml  Output 950 ml  Net -710 ml   Filed Weights   06/22/19 0356 06/23/19 0417 06/24/19 0356  Weight: 110.7 kg 112.3 kg 111.8 kg    Examination:  General exam: Appears calm and comfortable, NAD Respiratory system: Clear to auscultation. Respiratory effort normal.  No wheezing or rails Cardiovascular system: S1 & S2 heard, RRR.  No murmurs, rubs, gallops or clicks.   Gastrointestinal system: Abdomen is nondistended, soft and nontender. Normal bowel sounds heard. Central nervous system: Alert and oriented.  Grossly intact Extremities: Mild bilateral edema with chronic skin changes, right BKA Skin: Warm dry Psychiatry:  Mood & affect appropriate for current setting.     Data Reviewed: I have personally reviewed following labs and imaging studies  CBC: Recent Labs  Lab 06/21/19 1453 06/24/19 0459  WBC 8.1 6.4  HGB 9.7* 9.2*  HCT 30.2* 29.9*  MCV 96.8 98.0  PLT 228 893   Basic Metabolic Panel: Recent Labs  Lab 06/21/19 0936 06/21/19 1453 06/22/19 0506 06/23/19 0402 06/24/19 0459  NA  --  138 138 138 138  K  --  4.7 4.5 4.4 4.4  CL  --  108 107 108 107  CO2  --  19* 22 23 25   GLUCOSE  --  179* 81 119* 98  BUN 43* 45* 48* 57* 55*  CREATININE 1.73* 1.67* 1.71* 1.87* 1.81*  CALCIUM  --  8.1* 8.1* 7.9* 7.9*  MG  --   --   --   --  2.2   GFR: Estimated Creatinine Clearance: 35.6 mL/min (A) (by C-G formula based on SCr of 1.81 mg/dL (H)). Liver Function Tests: No results for input(s): AST, ALT, ALKPHOS, BILITOT, PROT, ALBUMIN in the last 168 hours. No results for input(s): LIPASE, AMYLASE in the last 168 hours. No results for input(s):  AMMONIA in the last 168 hours. Coagulation Profile: No results for input(s): INR, PROTIME in the last 168 hours. Cardiac Enzymes: No results for input(s): CKTOTAL, CKMB, CKMBINDEX, TROPONINI in the last 168 hours. BNP (last 3 results) No results for input(s): PROBNP in the last 8760 hours. HbA1C: No results for input(s): HGBA1C in the last 72 hours. CBG: Recent Labs  Lab 06/23/19 2131 06/23/19 2338 06/24/19 0357 06/24/19 0757 06/24/19 1200  GLUCAP 214* 147* 98 99 81   Lipid Profile: No results for input(s): CHOL, HDL, LDLCALC, TRIG, CHOLHDL, LDLDIRECT in the last 72 hours. Thyroid Function Tests: No results for input(s): TSH, T4TOTAL, FREET4, T3FREE, THYROIDAB in the last 72 hours. Anemia  Panel: No results for input(s): VITAMINB12, FOLATE, FERRITIN, TIBC, IRON, RETICCTPCT in the last 72 hours. Sepsis Labs: No results for input(s): PROCALCITON, LATICACIDVEN in the last 168 hours.  Recent Results (from the past 240 hour(s))  SARS CORONAVIRUS 2 (TAT 6-24 HRS) Nasopharyngeal Nasopharyngeal Swab     Status: None   Collection Time: 06/20/19  9:42 AM   Specimen: Nasopharyngeal Swab  Result Value Ref Range Status   SARS Coronavirus 2 NEGATIVE NEGATIVE Final    Comment: (NOTE) SARS-CoV-2 target nucleic acids are NOT DETECTED. The SARS-CoV-2 RNA is generally detectable in upper and lower respiratory specimens during the acute phase of infection. Negative results do not preclude SARS-CoV-2 infection, do not rule out co-infections with other pathogens, and should not be used as the sole basis for treatment or other patient management decisions. Negative results must be combined with clinical observations, patient history, and epidemiological information. The expected result is Negative. Fact Sheet for Patients: SugarRoll.be Fact Sheet for Healthcare Providers: https://www.woods-mathews.com/ This test is not yet approved or cleared by the [place] FDA and  has been authorized for detection and/or diagnosis of SARS-CoV-2 by FDA under an Emergency Use Authorization (EUA). This EUA will remain  in effect (meaning this test can be used) for the duration of the COVID-19 declaration under Section 56 4(b)(1) of the Act, 21 U.S.C. section 360bbb-3(b)(1), unless the authorization is terminated or revoked sooner. Performed at [facility], [place] [address], [place], [place] [address]          Radiology Studies: No results found.      Scheduled Meds: . aspirin EC  81 mg Oral Daily  . felodipine  7.5 mg Oral Daily  . ferrous sulfate  325 mg Oral Daily  . heparin  5,000 Units Subcutaneous Q8H  . hydrALAZINE  25 mg Oral TID    . insulin aspart  0-9 Units Subcutaneous Q4H  . insulin detemir  10 Units Subcutaneous q morning - 10a  . ipratropium-albuterol  3 mL Nebulization Once  . loratadine  10 mg Oral Daily  . metoprolol tartrate  25 mg Oral BID  . multivitamin-lutein  1 capsule Oral Daily  . pravastatin  40 mg Oral q1800  . sodium chloride flush  3 mL Intravenous Q12H  . sodium chloride irrigation  30 mL Irrigation QPM   Continuous Infusions: . sodium chloride    . sodium chloride    .  ceFAZolin (ANCEF) IV    .  ceFAZolin (ANCEF) IV      Assessment & Plan:   Principal Problem:   Acute on chronic diastolic CHF (congestive heart failure) (HCC) Active Problems:   CKD (chronic kidney disease) stage 4, GFR 15-29 ml/min (HCC)   HLD (hyperlipidemia)   Atrial flutter (HCC)   HTN (hypertension)  Type II diabetes mellitus with renal manifestations (HCC)   Iron deficiency anemia   Atherosclerosis of artery of extremity with ulceration (HCC)   Acute on chronic diastolic CHF (congestive heart failure) [facility]): Patient had 3+ leg edema, elevated BNP, chest x-ray showed pulmonary edema, consistent with CHF exacerbation. Was on lasix iv 40 mg bid , discontinued as creatinine increased and he appears to be more euvolemic now Continue monitoring volume status. -2d echo-EF 50 to 55%, EF is low normal function.  LV with global hypokinesis.  Grade 1 diastolic dysfunction.  Please see full report -Daily weights -strict I/O's -Low salt diet -Fluid restriction May need to restart po lasix at low dose in am if creatinine allows.  CKD (chronic kidney disease) stage 4, GFR 15-29 ml/min ([place]):  BUN and creatinine mildly elevated but likely baseline. May need to resume Lasix 40 mEq p.o. starting in a.m.  HLD (hyperlipidemia): -Pravastatin  Atrial flutter ([place]); -Continue metoprolol -Hold Eliquis since patient may need a procedure when CHF is controlled  HTN:  -Continue home medications: Felodipine,  hydralazine, metoprolol, -hydralazine prn  Type II diabetes mellitus with renal manifestations ([place]): Most recent A1c 7.1, poorly controled. Patient is taking NovoLog and Levemir at home -This a.m. he was hypoglycemic .  Will decrease a.m. Levemir dose from 14 to 10 units and discontinue pm insulin dose -SSI -ck fs  Iron deficiency anemia: Hemoglobin 9.7 (10.1 on 01/25/2018) -Continue iron supplement  Atherosclerosis of artery of extremity with ulceration ([place]): -angiography of theleftlower extremitywith the hope for intervention for limb salvage by VVS,, possibly today?   DVT prophylaxis: Heparin Code Status: Full Family Communication: None at bedside Disposition Plan: Will likely discharge in 1 to 2 days once more euvolemic , creatinine stablized after angiography, and may go for angiography today      LOS: 3 days   Time spent: 45 minutes with more than 50% COC    Miguel Hanlon, MD [facility] Pager [phone]  If 7PM-7AM, please contact night-coverage [email or web address] Password [facility] 06/24/2019, 2:49 PM Patient ID: Miguel Torres, male   DOB: Feb 18, 1931, 86 y.o.   MRN: [id number]

## 2019-06-25 ENCOUNTER — Inpatient Hospital Stay: Payer: Medicare (Managed Care)

## 2019-06-25 LAB — BASIC METABOLIC PANEL
Anion gap: 13 (ref 5–15)
BUN: 71 mg/dL — ABNORMAL HIGH (ref 8–23)
CO2: 20 mmol/L — ABNORMAL LOW (ref 22–32)
Calcium: 8.3 mg/dL — ABNORMAL LOW (ref 8.9–10.3)
Chloride: 104 mmol/L (ref 98–111)
Creatinine, Ser: 2.37 mg/dL — ABNORMAL HIGH (ref 0.61–1.24)
GFR calc Af Amer: 28 mL/min — ABNORMAL LOW (ref >60)
GFR calc non Af Amer: 24 mL/min — ABNORMAL LOW (ref >60)
Glucose, Bld: 149 mg/dL — ABNORMAL HIGH (ref 70–99)
Potassium: 5.4 mmol/L — ABNORMAL HIGH (ref 3.5–5.1)
Sodium: 137 mmol/L (ref 135–145)

## 2019-06-25 LAB — URINALYSIS, ROUTINE W REFLEX MICROSCOPIC
Bilirubin Urine: NEGATIVE
Glucose, UA: NEGATIVE mg/dL
Ketones, ur: NEGATIVE mg/dL
Nitrite: NEGATIVE
Protein, ur: 100 mg/dL — AB
RBC / HPF: >50 RBC/hpf — ABNORMAL HIGH (ref 0–5)
Specific Gravity, Urine: 1.023 (ref 1.005–1.030)
Squamous Epithelial / HPF: NONE SEEN (ref 0–5)
WBC, UA: >50 WBC/hpf — ABNORMAL HIGH (ref 0–5)
pH: 7 (ref 5.0–8.0)

## 2019-06-25 LAB — GLUCOSE, CAPILLARY
Glucose-Capillary: 119 mg/dL — ABNORMAL HIGH (ref 70–99)
Glucose-Capillary: 136 mg/dL — ABNORMAL HIGH (ref 70–99)
Glucose-Capillary: 145 mg/dL — ABNORMAL HIGH (ref 70–99)
Glucose-Capillary: 209 mg/dL — ABNORMAL HIGH (ref 70–99)
Glucose-Capillary: 234 mg/dL — ABNORMAL HIGH (ref 70–99)
Glucose-Capillary: 279 mg/dL — ABNORMAL HIGH (ref 70–99)

## 2019-06-25 LAB — PROTEIN / CREATININE RATIO, URINE
Creatinine, Urine: 126 mg/dL
Protein Creatinine Ratio: 2.88 mg/mg{Cre} — ABNORMAL HIGH (ref 0.00–0.15)
Total Protein, Urine: 363 mg/dL

## 2019-06-25 MED ORDER — FUROSEMIDE 10 MG/ML IJ SOLN
40.0000 mg | Freq: Once | INTRAMUSCULAR | Status: AC
  Administered 2019-06-25: 40 mg via INTRAVENOUS
  Filled 2019-06-25: qty 4

## 2019-06-25 MED ORDER — APIXABAN 2.5 MG PO TABS
2.5000 mg | ORAL_TABLET | Freq: Two times a day (BID) | ORAL | Status: DC
  Administered 2019-06-25 – 2019-07-01 (×13): 2.5 mg via ORAL
  Filled 2019-06-25 (×13): qty 1

## 2019-06-25 MED ORDER — DEXTROSE-NACL 5-0.45 % IV SOLN: INTRAVENOUS | Status: DC

## 2019-06-25 NOTE — Consult Note (Signed)
[facility]  CONSULT NOTE    Date: 06/25/2019                  Patient Name:  Miguel Torres  MRN: [id number]  DOB: 11/23/1930  Age / Sex: 86 y.o., male         PCP: [name], MD                 Service Requesting Consult: Dr. Kurtis Bushman                 Reason for Consult: Acute renal failure            History of Present Illness: [name] admitted with acute exacerbation of diastolic congestive heart failure. Underwent angiogram yesterday by Dr. Delana Meyer.   Nephrology consulted for abrupt rise in creatinine. Patient state he is very tired, sleepy and short of breath.    Medications: Outpatient medications: Medications Prior to Admission  Medication Sig Dispense Refill Last Dose  . acetaminophen (TYLENOL) 325 MG tablet Take 2 tablets (650 mg total) by mouth every 6 (six) hours as needed for mild pain (or Fever >/= 101). (Patient taking differently: Take 325 mg by mouth every 4 (four) hours as needed for mild pain (general discomfort Max 3 grams/24hrs.). )   06/21/2019 at Unknown time  . acetic acid 0.25 % irrigation Irrigate with 1 application as directed See admin instructions. Use 30 ml via irrigation every evening shift for decrease bacterial load of supra pubic catheter   06/20/2019 at Unknown time  . apixaban (ELIQUIS) 2.5 MG TABS tablet Take 1 tablet (2.5 mg total) by mouth 2 (two) times daily. 60 tablet  06/17/2019  . beta carotene w/minerals (OCUVITE) tablet Take 1 tablet by mouth daily.   06/21/2019 at Unknown time  . Cholecalciferol (VITAMIN D3) 50000 units CAPS Take 50,000 Units by mouth every Sunday.    06/19/2019  . felodipine (PLENDIL) 2.5 MG 24 hr tablet Take 7.5 mg by mouth daily.   06/21/2019 at Unknown time  . ferrous sulfate 325 (65 FE) MG tablet Take 325 mg by mouth daily.    06/21/2019 at Unknown time  . fexofenadine (ALLEGRA) 60 MG tablet Take 60 mg by mouth daily as needed for rhinitis (seasonal allergies.).    unknown at  prn  . furosemide (LASIX) 40 MG tablet Take 40 mg by mouth daily.   06/21/2019 at Unknown time  . hydrALAZINE (APRESOLINE) 25 MG tablet Take 25 mg by mouth 3 (three) times daily.    06/21/2019 at Unknown time  . insulin aspart (NOVOLOG) 100 UNIT/ML injection Inject 0-9 Units into the skin 3 (three) times daily with meals. (Patient taking differently: Inject 3-11 Units into the skin 3 (three) times daily with meals. Sliding Scale Insulin:150-200=3 units, 201-250=5 units, 251-300=7 units, 301-350=9 units, 351-450=11 units, >450 call NP OR MD) 10 mL 11 06/20/2019 at Unknown time  . LEVEMIR FLEXTOUCH 100 UNIT/ML Pen Inject 22 Units into the skin See admin instructions. Inject 22 units subcutaneously daily in the morning & inject 32 units subcutaneously at bedtime.   06/20/2019 at Unknown time  . lovastatin (MEVACOR) 40 MG tablet TAKE 1 TABLET (40 MG TOTAL) BY MOUTH DAILY. (Patient taking differently: Take 40 mg by mouth daily. ) 90 tablet 3 06/20/2019 at Unknown time  . metoprolol tartrate (LOPRESSOR) 25 MG tablet Take 25 mg by mouth 2 (two) times daily.   06/21/2019 at Unknown time    Current medications:  Current Facility-Administered Medications  Medication Dose Route Frequency Provider Last Rate Last Admin  . 0.9 %  sodium chloride infusion  250 mL Intravenous PRN Schnier, Dolores Lory, MD      . 0.9 %  sodium chloride infusion  250 mL Intravenous PRN Schnier, Dolores Lory, MD      . acetaminophen (TYLENOL) tablet 650 mg  650 mg Oral Q4H PRN Schnier, Dolores Lory, MD   650 mg at 06/25/19 1230  . aspirin EC tablet 81 mg  81 mg Oral Daily Schnier, Dolores Lory, MD   81 mg at 06/25/19 0835  . felodipine (PLENDIL) 24 hr tablet 7.5 mg  7.5 mg Oral Daily Schnier, Dolores Lory, MD   7.5 mg at 06/25/19 3016  . ferrous sulfate tablet 325 mg  325 mg Oral Daily Schnier, Dolores Lory, MD   325 mg at 06/25/19 0109  . heparin injection 5,000 Units  5,000 Units Subcutaneous Loletta Parish, MD   5,000 Units at 06/25/19 1345  .  hydrALAZINE (APRESOLINE) tablet 25 mg  25 mg Oral TID Katha Cabal, MD   25 mg at 06/25/19 0853  . hydrALAZINE (APRESOLINE) tablet 25 mg  25 mg Oral TID PRN Schnier, Dolores Lory, MD      . HYDROmorphone (DILAUDID) injection 1 mg  1 mg Intravenous Once PRN Schnier, Dolores Lory, MD      . insulin aspart (novoLOG) injection 0-9 Units  0-9 Units Subcutaneous Q4H Schnier, Dolores Lory, MD   3 Units at 06/25/19 1230  . insulin detemir (LEVEMIR) injection 10 Units  10 Units Subcutaneous q morning - 10a Schnier, Dolores Lory, MD   10 Units at 06/25/19 [phone]  . ipratropium-albuterol (DUONEB) 0.5-2.5 (3) MG/3ML nebulizer solution 3 mL  3 mL Nebulization Once Schnier, Dolores Lory, MD      . loratadine (CLARITIN) tablet 10 mg  10 mg Oral Daily Schnier, Dolores Lory, MD   10 mg at 06/25/19 0854  . metoprolol tartrate (LOPRESSOR) tablet 25 mg  25 mg Oral BID Delana Meyer Dolores Lory, MD   25 mg at 06/25/19 0854  . morphine 4 MG/ML injection 1 mg  1 mg Intravenous Q3H PRN Schnier, Dolores Lory, MD      . multivitamin-lutein (OCUVITE-LUTEIN) capsule 1 capsule  1 capsule Oral Daily Schnier, Dolores Lory, MD   1 capsule at 06/25/19 0855  . ondansetron (ZOFRAN) injection 4 mg  4 mg Intravenous Q6H PRN Schnier, Dolores Lory, MD      . ondansetron [facility]) injection 4 mg  4 mg Intravenous Q6H PRN Schnier, Dolores Lory, MD      . oxyCODONE (Oxy IR/ROXICODONE) immediate release tablet 5-10 mg  5-10 mg Oral Q4H PRN Schnier, Dolores Lory, MD      . pravastatin (PRAVACHOL) tablet 40 mg  40 mg Oral q1800 Schnier, Dolores Lory, MD   40 mg at 06/23/19 1724  . sodium chloride flush (NS) 0.9 % injection 3 mL  3 mL Intravenous Q12H Schnier, Dolores Lory, MD   3 mL at 06/25/19 0855  . sodium chloride flush (NS) 0.9 % injection 3 mL  3 mL Intravenous PRN Schnier, Dolores Lory, MD      . sodium chloride flush (NS) 0.9 % injection 3 mL  3 mL Intravenous Q12H Schnier, Dolores Lory, MD   3 mL at 06/25/19 0855  . sodium chloride flush (NS) 0.9 % injection 3 mL  3 mL Intravenous  PRN Schnier, Dolores Lory, MD      . sodium chloride  irrigation 0.9 % 30 mL  30 mL Irrigation QPM Schnier, Dolores Lory, MD   30 mL at 06/23/19 1725      Allergies: No Known Allergies    Past Medical History: Past Medical History:  Diagnosis Date  . Anemia   . CHF (congestive heart failure) ([place])   . Diabetes mellitus without complication ([place])   . Hyperlipidemia   . Hypertension   . Skin cancer      Past Surgical History: Past Surgical History:  Procedure Laterality Date  . BASAL CELL CARCINOMA EXCISION    . History of eye surgery Bilateral   . IR CATHETER TUBE CHANGE  03/01/2018  . MOHS SURGERY  04/2011   the top of his head  . Right BKA  09/12/2010   Dr. Delana Meyer; Secondary gangrenous right LE  . TONSILLECTOMY       Family History: Family History  Problem Relation Age of Onset  . Diabetes Mother   . Arthritis Sister   . Diabetes Daughter      Social History: Social History   Socioeconomic History  . Marital status: Married    Spouse name: Not on file  . Number of children: 5  . Years of education: 5th grade  . Highest education level: Not on file  Occupational History  . Occupation: Retired  Tobacco Use  . Smoking status: Never Smoker  . Smokeless tobacco: Never Used  Substance and Sexual Activity  . Alcohol use: No  . Drug use: No  . Sexual activity: Not Currently  Other Topics Concern  . Not on file  Social History Narrative   Lives at home by himself.  Ambulates with a walker   Has a  right prosthetic leg   Social Determinants of Health   Financial Resource Strain:   . Difficulty of Paying Living Expenses: Not on file  Food Insecurity:   . Worried About Charity fundraiser in the Last Year: Not on file  . Ran Out of Food in the Last Year: Not on file  Transportation Needs:   . Lack of Transportation (Medical): Not on file  . Lack of Transportation (Non-Medical): Not on file  Physical Activity:   . Days of Exercise per Week: Not on file  .  Minutes of Exercise per Session: Not on file  Stress:   . Feeling of Stress : Not on file  Social Connections:   . Frequency of Communication with Friends and Family: Not on file  . Frequency of Social Gatherings with Friends and Family: Not on file  . Attends Religious Services: Not on file  . Active Member of Clubs or Organizations: Not on file  . Attends Archivist Meetings: Not on file  . Marital Status: Not on file  Intimate Partner Violence:   . Fear of Current or Ex-Partner: Not on file  . Emotionally Abused: Not on file  . Physically Abused: Not on file  . Sexually Abused: Not on file     Review of Systems: Review of Systems  Constitutional: Negative.  Negative for chills, diaphoresis, fever, malaise/fatigue and weight loss.  HENT: Negative.  Negative for congestion, ear discharge, ear pain, hearing loss, nosebleeds, sinus pain, sore throat and tinnitus.   Eyes: Negative.  Negative for blurred vision, double vision, photophobia, pain, discharge and redness.  Respiratory: Positive for shortness of breath and wheezing. Negative for cough, hemoptysis, sputum production and stridor.   Cardiovascular: Positive for claudication, leg swelling and PND. Negative for chest pain,  palpitations and orthopnea.  Gastrointestinal: Negative.  Negative for abdominal pain, blood in stool, constipation, diarrhea, heartburn, melena, nausea and vomiting.  Genitourinary: Negative.  Negative for dysuria, flank pain, frequency, hematuria and urgency.  Musculoskeletal: Negative.  Negative for back pain, falls, joint pain, myalgias and neck pain.  Skin: Negative.  Negative for rash.  Neurological: Positive for weakness. Negative for dizziness, tingling, tremors, sensory change, speech change, focal weakness, seizures, loss of consciousness and headaches.  Endo/Heme/Allergies: Negative.  Negative for environmental allergies and polydipsia. Does not bruise/bleed easily.  Psychiatric/Behavioral:  Positive for memory loss. Negative for hallucinations, substance abuse and suicidal ideas. The patient is not nervous/anxious and does not have insomnia.     Vital Signs: Blood pressure 115/63, pulse 79, temperature 98.5 F (36.9 C), temperature source Oral, resp. rate 18, height 5\' 8"  (1.727 m), weight 109.6 kg, SpO2 100 %.  Weight trends: Filed Weights   06/24/19 0356 06/24/19 1722 06/25/19 0422  Weight: 111.8 kg 111.8 kg 109.6 kg    Physical Exam: General: NAD, ill appearing  Head: Normocephalic, atraumatic. Moist oral mucosal membranes  Eyes: Anicteric, PERRL  Neck: Supple, trachea midline  Lungs:  Bilateral crackles  Heart: irregular  Abdomen:  Soft, nontender  Extremities:  +++ peripheral edema. Right BKA  Neurologic: Alert to self and place  Skin: No lesions  GU: + scrotal edema     Lab results: Basic Metabolic Panel: Recent Labs  Lab 06/23/19 0402 06/24/19 0459 06/25/19 0534  NA 138 138 137  K 4.4 4.4 5.4*  CL 108 107 104  CO2 23 25 20*  GLUCOSE 119* 98 149*  BUN 57* 55* 71*  CREATININE 1.87* 1.81* 2.37*  CALCIUM 7.9* 7.9* 8.3*  MG  --  2.2  --     Liver Function Tests: No results for input(s): AST, ALT, ALKPHOS, BILITOT, PROT, ALBUMIN in the last 168 hours. No results for input(s): LIPASE, AMYLASE in the last 168 hours. No results for input(s): AMMONIA in the last 168 hours.  CBC: Recent Labs  Lab 06/21/19 1453 06/24/19 0459  WBC 8.1 6.4  HGB 9.7* 9.2*  HCT 30.2* 29.9*  MCV 96.8 98.0  PLT 228 218    Cardiac Enzymes: No results for input(s): CKTOTAL, CKMB, CKMBINDEX, TROPONINI in the last 168 hours.  BNP: Invalid input(s): POCBNP  CBG: Recent Labs  Lab 06/24/19 2220 06/25/19 0030 06/25/19 0407 06/25/19 0736 06/25/19 1202  GLUCAP 92 119* 145* 136* 209*    Microbiology: Results for orders placed or performed during the hospital encounter of 06/20/19  SARS CORONAVIRUS 2 (TAT 6-24 HRS) Nasopharyngeal Nasopharyngeal Swab     Status:  None   Collection Time: 06/20/19  9:42 AM   Specimen: Nasopharyngeal Swab  Result Value Ref Range Status   SARS Coronavirus 2 NEGATIVE NEGATIVE Final    Comment: (NOTE) SARS-CoV-2 target nucleic acids are NOT DETECTED. The SARS-CoV-2 RNA is generally detectable in upper and lower respiratory specimens during the acute phase of infection. Negative results do not preclude SARS-CoV-2 infection, do not rule out co-infections with other pathogens, and should not be used as the sole basis for treatment or other patient management decisions. Negative results must be combined with clinical observations, patient history, and epidemiological information. The expected result is Negative. Fact Sheet for Patients: SugarRoll.be Fact Sheet for Healthcare Providers: https://www.woods-mathews.com/ This test is not yet approved or cleared by the [place] FDA and  has been authorized for detection and/or diagnosis of SARS-CoV-2 by FDA under an Emergency Use  Authorization (EUA). This EUA will remain  in effect (meaning this test can be used) for the duration of the COVID-19 declaration under Section 56 4(b)(1) of the Act, 21 U.S.C. section 360bbb-3(b)(1), unless the authorization is terminated or revoked sooner. Performed at [facility], [place] [address] [place], [place] [address]     Coagulation Studies: No results for input(s): LABPROT, INR in the last 72 hours.  Urinalysis: No results for input(s): COLORURINE, LABSPEC, PHURINE, GLUCOSEU, HGBUR, BILIRUBINUR, KETONESUR, PROTEINUR, UROBILINOGEN, NITRITE, LEUKOCYTESUR in the last 72 hours.  Invalid input(s): APPERANCEUR    Imaging: PERIPHERAL VASCULAR CATHETERIZATION  Result Date: 06/24/2019 See Op Note     Assessment & Plan: Mr. LAMONT TANT is a 86 y.o. white male with insulin dependent diabetes, hypertension, COPD, peripheral vascular disease, congestive heart failure,  hyperlipidemia, anemia, right below the knee amputation, CVA, gout, atrial fibrillation on eliquis, who was admitted to [facility] on 06/21/2019 for Acute on chronic diastolic CHF (congestive heart failure) ([place]) [I50.33]   Underwent left lower extremity angiogram on 2/19.   1. Acute renal failure: with hyperkalemia on chronic kidney disease stage IIIB with proteinuria. Baseline creatinine of 1.79, GFR of 33 on 12/21/2017.  Chronic kidney disease secondary to diabetic nephropathy Contrast exposure is less likely to have caused renal failure. Suspect underlying cardio-renal syndrome.   2. Hypertension and acute exacerbation of diastolic congestive heart failure: then given IV fluids due to angiogram on 2/19. Now with volume overload with significant pulmonary, scrotal and pedal edema on examination.  - IV furosemide 40mg  x 1 - felodipine, metoprolol, hydralazine,   3. Diabetes mellitus type II with chronic kidney disease: insulin dependent - Check hemoglobin A1c  4. Anemia with chronic kidney disease: hemoglobin 9.2 Normocytic.  - Check iron studies, vitamin W29 and folic acid.   LOS: 4 Anthonia Monger 2/20/20211:47 PM

## 2019-06-25 NOTE — Progress Notes (Signed)
Subjective  - POD #1, status post angiography  The patient is without complaints today.   Physical Exam:  Left foot is warm and well-perfused. Right groin cannulation site is without complication       Assessment/Plan:  POD #1  Despite only 50 cc of contrast administration yesterday, the patient has had a spike in his creatinine.  Nephrology is now on board.  Once his renal issues have resolved the patient is stable from a vascular standpoint to be discharged home.  We will plan on repeat angiography with an attempt for revascularization in a few weeks.  Okay to resume Eliquis  Wells Rameen Quinney 06/25/2019 4:12 PM --  Vitals:   06/25/19 1151 06/25/19 1337  BP: 128/65 115/63  Pulse: 69 79  Resp: 18   Temp: 98.5 F (36.9 C)   SpO2: 100%     Intake/Output Summary (Last 24 hours) at 06/25/2019 1612 Last data filed at 06/25/2019 0658 Gross per 24 hour  Intake 360 ml  Output 850 ml  Net -490 ml     Laboratory CBC    Component Value Date/Time   WBC 6.4 06/24/2019 0459   HGB 9.2 (L) 06/24/2019 0459   HGB 11.0 (L) 10/15/2016 1611   HCT 29.9 (L) 06/24/2019 0459   HCT 34.5 (L) 10/15/2016 1611   PLT 218 06/24/2019 0459   PLT 183 10/15/2016 1611    BMET    Component Value Date/Time   NA 137 06/25/2019 0534   NA 138 10/15/2016 1611   NA 140 09/30/2012 1609   K 5.4 (H) 06/25/2019 0534   K 4.5 09/30/2012 1609   CL 104 06/25/2019 0534   CL 111 (H) 09/30/2012 1609   CO2 20 (L) 06/25/2019 0534   CO2 24 09/30/2012 1609   GLUCOSE 149 (H) 06/25/2019 0534   GLUCOSE 90 09/30/2012 1609   BUN 71 (H) 06/25/2019 0534   BUN 30 (H) 10/15/2016 1611   BUN 28 (H) 09/30/2012 1609   CREATININE 2.37 (H) 06/25/2019 0534   CREATININE 1.33 (H) 09/30/2012 1609   CALCIUM 8.3 (L) 06/25/2019 0534   CALCIUM 8.5 09/30/2012 1609   GFRNONAA 24 (L) 06/25/2019 0534   GFRNONAA 50 (L) 09/30/2012 1609   GFRAA 28 (L) 06/25/2019 0534   GFRAA 58 (L) 09/30/2012 1609    COAG Lab Results   Component Value Date   INR 1.20 01/25/2018   No results found for: PTT  Antibiotics Anti-infectives (From admission, onward)   Start     Dose/Rate Route Frequency Ordered Stop   06/24/19 1845  ceFAZolin (ANCEF) 1,000 mg in dextrose 5 % 100 mL IVPB  Status:  Discontinued     1,000 mg 220 mL/hr over 30 Minutes Intravenous  Once 06/24/19 1842 06/24/19 1843   06/24/19 0600  ceFAZolin (ANCEF) IVPB 1 g/50 mL premix  Status:  Discontinued    Note to Pharmacy: To be given in specials   1 g 100 mL/hr over 30 Minutes Intravenous On call to O.R. 06/23/19 1833 06/23/19 1909   06/24/19 0600  ceFAZolin (ANCEF) IVPB 2g/100 mL premix  Status:  Discontinued    Note to Pharmacy: To be given in specials   2 g 200 mL/hr over 30 Minutes Intravenous On call to O.R. 06/23/19 1909 06/24/19 1839   06/21/19 1100  ceFAZolin (ANCEF) 2-4 GM/100ML-% IVPB    Note to Pharmacy: Freddi Starr   : cabinet override      06/21/19 1100 06/21/19 2314   06/21/19 0915  ceFAZolin (  ANCEF) IVPB 2g/100 mL premix  Status:  Discontinued     2 g 200 mL/hr over 30 Minutes Intravenous  Once 06/21/19 0908 06/24/19 1839       V. Leia Alf, M.D., [facility] [place] Office: [phone] Pager:  [phone]

## 2019-06-25 NOTE — Plan of Care (Signed)
°  Problem: Clinical Measurements: °Goal: Respiratory complications will improve °Outcome: Progressing °  °Problem: Pain Managment: °Goal: General experience of comfort will improve °Outcome: Progressing °  °Problem: Safety: °Goal: Ability to remain free from injury will improve °Outcome: Progressing °  °

## 2019-06-25 NOTE — Progress Notes (Signed)
PROGRESS NOTE    Miguel Torres  [id number]:[id number] DOB: Sep 09, 1930 DOA: 06/21/2019 PCP: [name], MD    Brief Narrative:  Miguel Torres is a 86 y.o. male with medical history significant of hypertension, hyperlipidemia, diabetes mellitus, dCHF, iron deficiency anemia, CKD-4, s/p of right BKA, atrial flutter on Eliquis, who presents with shortness breath, leg edema.   Pt has hx of atherosclerosis of native arteries of the extremities with ulceration. He is scheduled for angiography of theleftlower extremitywith the hope for intervention for limb salvage by VVS, however pt is found to have SOB and severe leg edema, concerning for CHF exacerbation. The procedure is canceled and we are asked to treat his CHF exacerbation.  Patient states that he has dry cough, shortness of breath, no chest pain, fever or chills.  No nausea vomiting, diarrhea, abdominal pain, symptoms of UTI or unilateral weakness.  He has a severe bilateral leg edema.    Consultants:   None  Procedures: That is post abdominal aortogram, status post left lower extremity angiography third order catheter placement, [employer] right common femoral by vascular surgery on 06/24/2019  Antimicrobials:   None   Subjective: Per nsg, pt with sob, ivf were stopped last night. Reports "little" sob. No other sx  Objective: Vitals:   06/25/19 0854 06/25/19 0901 06/25/19 1151 06/25/19 1337  BP:   128/65 115/63  Pulse: 75 80 69 79  Resp:   18   Temp:   98.5 F (36.9 C)   TempSrc:   Oral   SpO2:  97% 100%   Weight:      Height:        Intake/Output Summary (Last 24 hours) at 06/25/2019 1502 Last data filed at 06/25/2019 4481 Gross per 24 hour  Intake 360 ml  Output 850 ml  Net -490 ml   Filed Weights   06/24/19 0356 06/24/19 1722 06/25/19 0422  Weight: 111.8 kg 111.8 kg 109.6 kg    Examination:  General exam: Appears tired, calm  Respiratory system: fine crackles mostly b/l bases, no  wheezing Cardiovascular system: S1 & S2 heard, RRR.  No murmurs, rubs, gallops or clicks.  Gastrointestinal system: Abdomen is nondistended, soft and nontender. Normal bowel sounds heard. Central nervous system: awake and alert and oriented.  Grossly intact GU: scrotal swelling  Extremities:right BKA with bilateral skin changes,+ b/l edema Skin: Warm dry Psychiatry:  Mood & affect appropriate for current setting.     Data Reviewed: I have personally reviewed following labs and imaging studies  CBC: Recent Labs  Lab 06/21/19 1453 06/24/19 0459  WBC 8.1 6.4  HGB 9.7* 9.2*  HCT 30.2* 29.9*  MCV 96.8 98.0  PLT 228 856   Basic Metabolic Panel: Recent Labs  Lab 06/21/19 1453 06/22/19 0506 06/23/19 0402 06/24/19 0459 06/25/19 0534  NA 138 138 138 138 137  K 4.7 4.5 4.4 4.4 5.4*  CL 108 107 108 107 104  CO2 19* 22 23 25  20*  GLUCOSE 179* 81 119* 98 149*  BUN 45* 48* 57* 55* 71*  CREATININE 1.67* 1.71* 1.87* 1.81* 2.37*  CALCIUM 8.1* 8.1* 7.9* 7.9* 8.3*  MG  --   --   --  2.2  --    GFR: Estimated Creatinine Clearance: 26.9 mL/min (A) (by C-G formula based on SCr of 2.37 mg/dL (H)). Liver Function Tests: No results for input(s): AST, ALT, ALKPHOS, BILITOT, PROT, ALBUMIN in the last 168 hours. No results for input(s): LIPASE, AMYLASE in the last 168 hours.  No results for input(s): AMMONIA in the last 168 hours. Coagulation Profile: No results for input(s): INR, PROTIME in the last 168 hours. Cardiac Enzymes: No results for input(s): CKTOTAL, CKMB, CKMBINDEX, TROPONINI in the last 168 hours. BNP (last 3 results) No results for input(s): PROBNP in the last 8760 hours. HbA1C: No results for input(s): HGBA1C in the last 72 hours. CBG: Recent Labs  Lab 06/24/19 2220 06/25/19 0030 06/25/19 0407 06/25/19 0736 06/25/19 1202  GLUCAP 92 119* 145* 136* 209*   Lipid Profile: No results for input(s): CHOL, HDL, LDLCALC, TRIG, CHOLHDL, LDLDIRECT in the last 72  hours. Thyroid Function Tests: No results for input(s): TSH, T4TOTAL, FREET4, T3FREE, THYROIDAB in the last 72 hours. Anemia Panel: No results for input(s): VITAMINB12, FOLATE, FERRITIN, TIBC, IRON, RETICCTPCT in the last 72 hours. Sepsis Labs: No results for input(s): PROCALCITON, LATICACIDVEN in the last 168 hours.  Recent Results (from the past 240 hour(s))  SARS CORONAVIRUS 2 (TAT 6-24 HRS) Nasopharyngeal Nasopharyngeal Swab     Status: None   Collection Time: 06/20/19  9:42 AM   Specimen: Nasopharyngeal Swab  Result Value Ref Range Status   SARS Coronavirus 2 NEGATIVE NEGATIVE Final    Comment: (NOTE) SARS-CoV-2 target nucleic acids are NOT DETECTED. The SARS-CoV-2 RNA is generally detectable in upper and lower respiratory specimens during the acute phase of infection. Negative results do not preclude SARS-CoV-2 infection, do not rule out co-infections with other pathogens, and should not be used as the sole basis for treatment or other patient management decisions. Negative results must be combined with clinical observations, patient history, and epidemiological information. The expected result is Negative. Fact Sheet for Patients: SugarRoll.be Fact Sheet for Healthcare Providers: https://www.woods-mathews.com/ This test is not yet approved or cleared by the [place] FDA and  has been authorized for detection and/or diagnosis of SARS-CoV-2 by FDA under an Emergency Use Authorization (EUA). This EUA will remain  in effect (meaning this test can be used) for the duration of the COVID-19 declaration under Section 56 4(b)(1) of the Act, 21 U.S.C. section 360bbb-3(b)(1), unless the authorization is terminated or revoked sooner. Performed at [facility], [place] [address] [place], [place] [address]          Radiology Studies: PERIPHERAL VASCULAR CATHETERIZATION  Result Date: 06/24/2019 See Op Note       Scheduled  Meds: . aspirin EC  81 mg Oral Daily  . felodipine  7.5 mg Oral Daily  . ferrous sulfate  325 mg Oral Daily  . heparin  5,000 Units Subcutaneous Q8H  . hydrALAZINE  25 mg Oral TID  . insulin aspart  0-9 Units Subcutaneous Q4H  . insulin detemir  10 Units Subcutaneous q morning - 10a  . ipratropium-albuterol  3 mL Nebulization Once  . loratadine  10 mg Oral Daily  . metoprolol tartrate  25 mg Oral BID  . multivitamin-lutein  1 capsule Oral Daily  . pravastatin  40 mg Oral q1800  . sodium chloride flush  3 mL Intravenous Q12H  . sodium chloride flush  3 mL Intravenous Q12H  . sodium chloride irrigation  30 mL Irrigation QPM   Continuous Infusions: . sodium chloride    . sodium chloride      Assessment & Plan:   Principal Problem:   Acute on chronic diastolic CHF (congestive heart failure) (HCC) Active Problems:   CKD (chronic kidney disease) stage 4, GFR 15-29 ml/min (HCC)   HLD (hyperlipidemia)   Atrial flutter ([place])  HTN (hypertension)   Type II diabetes mellitus with renal manifestations (HCC)   Iron deficiency anemia   Atherosclerosis of artery of extremity with ulceration (HCC)   Acute on chronic diastolic CHF (congestive heart failure) [facility]): Patient had 3+ leg edema, elevated BNP, chest x-ray showed pulmonary edema, consistent with CHF exacerbation..then euvolemic.IV lasix d/c'd On 2/19 received gentle hydration , s/p angiogram..>>>now in acute decompensated HF. Creatinine up. -2d echo-EF 50 to 55%, EF is low normal function.  LV with global hypokinesis.  Grade 1 diastolic dysfunction.  Please see full report -Daily weights -strict I/O's -Low salt diet -Fluid restriction Start lasix 40mg  iv x1, reassess and give as needed.   Acute on CKD (chronic kidney disease) stage 4, GFR 15-29 ml/min (HCC):  BUN and creatinine elevated today, cr 2.37 from 1.81 Nephrology on board-believe more cardiorenal and post angiogram Given lasix 40iv x1 Monitor urine output and I's  and O    HLD (hyperlipidemia): -Pravastatin  Atrial flutter ([place]); -Continue metoprolol -Ok to Resume Eliquis today per vascular surgery Dr. Trula Slade via messaging  HTN:  -Continue home medications: Felodipine, hydralazine, metoprolol, -hydralazine prn  Type II diabetes mellitus with renal manifestations ([place]): Most recent A1c 7.1, poorly controled. Patient is taking NovoLog and Levemir at home . Levemir dose was discontinued from 14 to 10 units and discontinue pm insulin due to hypoglycemic episode  Continue to monitor blood glucose levels closely -SSI -ck fs  Iron deficiency anemia: Hemoglobin 9.7 (10.1 on 01/25/2018) -Continue iron supplement  Atherosclerosis of artery of extremity with ulceration ([place]): -angiography of theleftlower extremitywith the hope for intervention for limb salvage by VVS,, possibly today?   DVT prophylaxis: d/c heparin, resume, Eliquis Code Status: Full Family Communication: Updated daughter with above issues and plans Disposition Plan: Will likely discharge in 1 to 2 days once more euvolemic , creatinine stablized .    LOS: 4 days   Time spent: 45 minutes with more than 50% COC    Nolberto Hanlon, MD [facility] Pager [phone]  If 7PM-7AM, please contact night-coverage [email or web address] Password [facility] 06/25/2019, 3:02 PM Patient ID: ETHANAEL VEITH, male   DOB: [birth year], 86 y.o.   MRN: [id number]

## 2019-06-25 NOTE — Progress Notes (Addendum)
Patient has some SOB with expiratory wheezing with exertion this morning. Drinking plenty of fluids and CBG of 136. Sent a secure chat to MD to verify orders for continuous fluids.   Update 1024: MD at patient's bedside. Discontinued fluids that were ordered.

## 2019-06-25 NOTE — Progress Notes (Addendum)
Pt u/a showed some rare bacteria with wbc > 50. Notify prime. Will continue to  monitor.  Update 0000: Talked to [facility] but no new order was place. Will continue to monitor.

## 2019-06-25 NOTE — Plan of Care (Signed)
  Problem: Safety: Goal: Ability to remain free from injury will improve Outcome: Progressing   

## 2019-06-25 NOTE — Progress Notes (Addendum)
Pt was admitted on the floor with no sign of distress. Pt alert and oriented x 4. VSS. R groin dressing was clean, dry and intact. Will cotninue to monitor.  Update 0000: Pt dressing have some drainage to it. Notify prime and states to monitor. Will continue to monitor.

## 2019-06-26 LAB — CBC
HCT: 29.5 % — ABNORMAL LOW (ref 39.0–52.0)
Hemoglobin: 9 g/dL — ABNORMAL LOW (ref 13.0–17.0)
MCH: 30.8 pg (ref 26.0–34.0)
MCHC: 30.5 g/dL (ref 30.0–36.0)
MCV: 101 fL — ABNORMAL HIGH (ref 80.0–100.0)
Platelets: 197 10*3/uL (ref 150–400)
RBC: 2.92 MIL/uL — ABNORMAL LOW (ref 4.22–5.81)
RDW: 13.9 % (ref 11.5–15.5)
WBC: 7.1 10*3/uL (ref 4.0–10.5)
nRBC: 0 % (ref 0.0–0.2)

## 2019-06-26 LAB — BASIC METABOLIC PANEL
Anion gap: 10 (ref 5–15)
BUN: 80 mg/dL — ABNORMAL HIGH (ref 8–23)
CO2: 19 mmol/L — ABNORMAL LOW (ref 22–32)
Calcium: 7.8 mg/dL — ABNORMAL LOW (ref 8.9–10.3)
Chloride: 104 mmol/L (ref 98–111)
Creatinine, Ser: 2.88 mg/dL — ABNORMAL HIGH (ref 0.61–1.24)
GFR calc Af Amer: 22 mL/min — ABNORMAL LOW (ref >60)
GFR calc non Af Amer: 19 mL/min — ABNORMAL LOW (ref >60)
Glucose, Bld: 112 mg/dL — ABNORMAL HIGH (ref 70–99)
Potassium: 4.6 mmol/L (ref 3.5–5.1)
Sodium: 133 mmol/L — ABNORMAL LOW (ref 135–145)

## 2019-06-26 LAB — IRON AND TIBC
Iron: 56 ug/dL (ref 45–182)
Saturation Ratios: 24 % (ref 17.9–39.5)
TIBC: 238 ug/dL — ABNORMAL LOW (ref 250–450)
UIBC: 182 ug/dL

## 2019-06-26 LAB — FOLATE: Folate: 8.3 ng/mL (ref >5.9)

## 2019-06-26 LAB — GLUCOSE, CAPILLARY
Glucose-Capillary: 106 mg/dL — ABNORMAL HIGH (ref 70–99)
Glucose-Capillary: 135 mg/dL — ABNORMAL HIGH (ref 70–99)
Glucose-Capillary: 164 mg/dL — ABNORMAL HIGH (ref 70–99)
Glucose-Capillary: 206 mg/dL — ABNORMAL HIGH (ref 70–99)
Glucose-Capillary: 221 mg/dL — ABNORMAL HIGH (ref 70–99)
Glucose-Capillary: 242 mg/dL — ABNORMAL HIGH (ref 70–99)
Glucose-Capillary: 273 mg/dL — ABNORMAL HIGH (ref 70–99)

## 2019-06-26 LAB — FERRITIN: Ferritin: 28 ng/mL (ref 24–336)

## 2019-06-26 LAB — PHOSPHORUS: Phosphorus: 5.7 mg/dL — ABNORMAL HIGH (ref 2.5–4.6)

## 2019-06-26 LAB — VITAMIN B12: Vitamin B-12: 363 pg/mL (ref 180–914)

## 2019-06-26 NOTE — Progress Notes (Signed)
[facility]  ROUNDING NOTE   Subjective:   Patient states that he is feeling better.   UOP 200 reported. Furosemide 40mg  IV x 1 yesterday  Creatinine 2.88 (2.37)  Objective:  Vital signs in last 24 hours:  Temp:  [98.1 F (36.7 C)-98.5 F (36.9 C)] 98.3 F (36.8 C) (02/21 0400) Pulse Rate:  [62-79] 66 (02/21 0400) Resp:  [16-20] 20 (02/21 0400) BP: (107-129)/(54-77) 119/59 (02/21 0400) SpO2:  [97 %-100 %] 100 % (02/21 0400) Weight:  [108.7 kg] 108.7 kg (02/21 0400)  Weight change: -3.1 kg Filed Weights   06/24/19 1722 06/25/19 0422 06/26/19 0400  Weight: 111.8 kg 109.6 kg 108.7 kg    Intake/Output: I/O last 3 completed shifts: In: 360 [P.O.:360] Out: 500 [Urine:500]   Intake/Output this shift:  No intake/output data recorded.  Physical Exam: General: NAD, laying in bed  Head: Normocephalic, atraumatic. Moist oral mucosal membranes  Eyes: Anicteric, PERRL  Neck: Supple, trachea midline  Lungs:  Crackles at bases  Heart: irregular  Abdomen:  Soft, nontender, obese  Extremities:  ++ peripheral edema.  Neurologic: Nonfocal, moving all four extremities  Skin: No lesions  GU: +scrotal edema    Basic Metabolic Panel: Recent Labs  Lab 06/22/19 0506 06/22/19 0506 06/23/19 0402 06/23/19 0402 06/24/19 0459 06/25/19 0534 06/26/19 0412  NA 138  --  138  --  138 137 133*  K 4.5  --  4.4  --  4.4 5.4* 4.6  CL 107  --  108  --  107 104 104  CO2 22  --  23  --  25 20* 19*  GLUCOSE 81  --  119*  --  98 149* 112*  BUN 48*  --  57*  --  55* 71* 80*  CREATININE 1.71*  --  1.87*  --  1.81* 2.37* 2.88*  CALCIUM 8.1*   < > 7.9*   < > 7.9* 8.3* 7.8*  MG  --   --   --   --  2.2  --   --   PHOS  --   --   --   --   --   --  5.7*   < > = values in this interval not displayed.    Liver Function Tests: No results for input(s): AST, ALT, ALKPHOS, BILITOT, PROT, ALBUMIN in the last 168 hours. No results for input(s): LIPASE, AMYLASE in the last 168 hours. No  results for input(s): AMMONIA in the last 168 hours.  CBC: Recent Labs  Lab 06/21/19 1453 06/24/19 0459 06/26/19 0412  WBC 8.1 6.4 7.1  HGB 9.7* 9.2* 9.0*  HCT 30.2* 29.9* 29.5*  MCV 96.8 98.0 101.0*  PLT 228 218 197    Cardiac Enzymes: No results for input(s): CKTOTAL, CKMB, CKMBINDEX, TROPONINI in the last 168 hours.  BNP: Invalid input(s): POCBNP  CBG: Recent Labs  Lab 06/25/19 1633 06/25/19 2004 06/26/19 0020 06/26/19 0359 06/26/19 0926  GLUCAP 234* 279* 164* 106* 135*    Microbiology: Results for orders placed or performed during the hospital encounter of 06/20/19  SARS CORONAVIRUS 2 (TAT 6-24 HRS) Nasopharyngeal Nasopharyngeal Swab     Status: None   Collection Time: 06/20/19  9:42 AM   Specimen: Nasopharyngeal Swab  Result Value Ref Range Status   SARS Coronavirus 2 NEGATIVE NEGATIVE Final    Comment: (NOTE) SARS-CoV-2 target nucleic acids are NOT DETECTED. The SARS-CoV-2 RNA is generally detectable in upper and lower respiratory specimens during the acute phase of infection.  Negative results do not preclude SARS-CoV-2 infection, do not rule out co-infections with other pathogens, and should not be used as the sole basis for treatment or other patient management decisions. Negative results must be combined with clinical observations, patient history, and epidemiological information. The expected result is Negative. Fact Sheet for Patients: SugarRoll.be Fact Sheet for Healthcare Providers: https://www.woods-mathews.com/ This test is not yet approved or cleared by the [place] FDA and  has been authorized for detection and/or diagnosis of SARS-CoV-2 by FDA under an Emergency Use Authorization (EUA). This EUA will remain  in effect (meaning this test can be used) for the duration of the COVID-19 declaration under Section 56 4(b)(1) of the Act, 21 U.S.C. section 360bbb-3(b)(1), unless the authorization is  terminated or revoked sooner. Performed at [facility], [place] [address] [place], [place] [address]     Coagulation Studies: No results for input(s): LABPROT, INR in the last 72 hours.  Urinalysis: Recent Labs    06/25/19 1347  COLORURINE YELLOW  LABSPEC 1.023  PHURINE 7.0  GLUCOSEU NEGATIVE  HGBUR SMALL*  BILIRUBINUR NEGATIVE  KETONESUR NEGATIVE  PROTEINUR 100*  NITRITE NEGATIVE  LEUKOCYTESUR MODERATE*      Imaging: US RENAL  Result Date: 06/25/2019 CLINICAL DATA:  Chronic kidney disease stage 3. EXAM: RENAL / URINARY TRACT ULTRASOUND COMPLETE COMPARISON:  Report from renal ultrasound dated 08/08/2010 FINDINGS: Right Kidney: Renal measurements: 11.8 x 6.0 x 6.6 cm = volume: 246 mL . Echogenicity is increased. The kidney demonstrates a nodular contour. No mass or hydronephrosis visualized. Left Kidney: Renal measurements: 10.8 x 5.5 x 6.7 cm = volume: 208 mL. Echogenicity is increased. Renal cortex is thinned. No mass or hydronephrosis visualized. Bladder: Foley catheter in place.  No bladder abnormality. Other: None. IMPRESSION: Findings consistent with chronic kidney disease.  No hydronephrosis. Electronically Signed   By: Zerita Boers M.D.   On: 06/25/2019 17:28   PERIPHERAL VASCULAR CATHETERIZATION  Result Date: 06/24/2019 See Op Note    Medications:   . sodium chloride    . sodium chloride     . apixaban  2.5 mg Oral BID  . aspirin EC  81 mg Oral Daily  . felodipine  7.5 mg Oral Daily  . ferrous sulfate  325 mg Oral Daily  . hydrALAZINE  25 mg Oral TID  . insulin aspart  0-9 Units Subcutaneous Q4H  . insulin detemir  10 Units Subcutaneous q morning - 10a  . ipratropium-albuterol  3 mL Nebulization Once  . loratadine  10 mg Oral Daily  . metoprolol tartrate  25 mg Oral BID  . multivitamin-lutein  1 capsule Oral Daily  . pravastatin  40 mg Oral q1800  . sodium chloride flush  3 mL Intravenous Q12H  . sodium chloride flush  3 mL Intravenous Q12H  .  sodium chloride irrigation  30 mL Irrigation QPM   sodium chloride, sodium chloride, acetaminophen, hydrALAZINE, HYDROmorphone (DILAUDID) injection, morphine injection, ondansetron (ZOFRAN) IV, ondansetron (ZOFRAN) IV, oxyCODONE, sodium chloride flush, sodium chloride flush  Assessment/ Plan:  Mr. RAJIV PARLATO is a 86 y.o. white male with insulin dependent diabetes, hypertension, COPD, peripheral vascular disease, congestive heart failure, hyperlipidemia, anemia, right below the knee amputation, CVA, gout, atrial fibrillation on eliquis, who was admitted to [facility] on 06/21/2019 for Acute on chronic diastolic CHF (congestive heart failure) ([place]) [I50.33]   Underwent left lower extremity angiogram on 2/19.   1. Acute renal failure: with hyperkalemia on chronic kidney disease stage IIIB with proteinuria.  Baseline creatinine of 1.79, GFR of 33 on 12/21/2017.  Chronic kidney disease secondary to diabetic nephropathy Contrast exposure and underlying cardio-renal syndrome.  - holding diuretics.   2. Hypertension and acute exacerbation of diastolic congestive heart failure: then given IV fluids due to angiogram on 2/19. Now with volume overload with significant pulmonary, scrotal and pedal edema on examination.  - felodipine, metoprolol, hydralazine  3. Diabetes mellitus type II with chronic kidney disease: insulin dependent - Check hemoglobin A1c  4. Anemia with chronic kidney disease: hemoglobin 9.2 macrocytic. iron studies and folic acid at goal. Pending vitamin B12 level.    LOS: 5 Simmie Garin 2/21/202110:38 AM

## 2019-06-26 NOTE — Progress Notes (Signed)
PROGRESS NOTE    Miguel Torres  ZDG:[id number] DOB: 1930/07/03 DOA: 06/21/2019 PCP: [name], MD    Brief Narrative:  Miguel Torres is a 86 y.o. male with medical history significant of hypertension, hyperlipidemia, diabetes mellitus, dCHF, iron deficiency anemia, CKD-4, s/p of right BKA, atrial flutter on Eliquis, who presents with shortness breath, leg edema.   Pt has hx of atherosclerosis of native arteries of the extremities with ulceration. He is scheduled for angiography of theleftlower extremitywith the hope for intervention for limb salvage by VVS, however pt is found to have SOB and severe leg edema, concerning for CHF exacerbation. The procedure is canceled and we are asked to treat his CHF exacerbation.  Patient states that he has dry cough, shortness of breath, no chest pain, fever or chills.  No nausea vomiting, diarrhea, abdominal pain, symptoms of UTI or unilateral weakness.  He has a severe bilateral leg edema.    Consultants:   None  Procedures: That is post abdominal aortogram, status post left lower extremity angiography third order catheter placement, StarClose right common femoral by vascular surgery on 06/24/2019  Antimicrobials:   None   Subjective: Patient reports feeling better today.  Reports less short of breath.  Denies any other symptoms.  Objective: Vitals:   06/25/19 1631 06/25/19 1654 06/25/19 2002 06/26/19 0400  BP: (!) 107/54 (!) 107/56 129/77 (!) 119/59  Pulse: 69 62 70 66  Resp: 16  20 20   Temp: 98.1 F (36.7 C)  98.3 F (36.8 C) 98.3 F (36.8 C)  TempSrc: Oral  Oral Oral  SpO2: 97%  100% 100%  Weight:    108.7 kg  Height:        Intake/Output Summary (Last 24 hours) at 06/26/2019 1128 Last data filed at 06/26/2019 0406 Gross per 24 hour  Intake --  Output 200 ml  Net -200 ml   Filed Weights   06/24/19 1722 06/25/19 0422 06/26/19 0400  Weight: 111.8 kg 109.6 kg 108.7 kg    Examination:  General exam:  Appears calm, NAD Respiratory system: fine crackles mostly b/l bases, no wheezing or rhonchi Cardiovascular system: S1 & S2 heard, RRR.  No murmurs, rubs, gallops or clicks.  Gastrointestinal system: Abdomen is nondistended, soft and nontender. Normal bowel sounds heard. Central nervous system: awake and alert and oriented.  Grossly intact GU: scrotal swelling about the same Extremities:right BKA with bilateral skin changes,+ b/l edema Skin: Warm dry Psychiatry:  Mood & affect appropriate for current setting.     Data Reviewed: I have personally reviewed following labs and imaging studies  CBC: Recent Labs  Lab 06/21/19 1453 06/24/19 0459 06/26/19 0412  WBC 8.1 6.4 7.1  HGB 9.7* 9.2* 9.0*  HCT 30.2* 29.9* 29.5*  MCV 96.8 98.0 101.0*  PLT 228 218 595   Basic Metabolic Panel: Recent Labs  Lab 06/22/19 0506 06/23/19 0402 06/24/19 0459 06/25/19 0534 06/26/19 0412  NA 138 138 138 137 133*  K 4.5 4.4 4.4 5.4* 4.6  CL 107 108 107 104 104  CO2 22 23 25  20* 19*  GLUCOSE 81 119* 98 149* 112*  BUN 48* 57* 55* 71* 80*  CREATININE 1.71* 1.87* 1.81* 2.37* 2.88*  CALCIUM 8.1* 7.9* 7.9* 8.3* 7.8*  MG  --   --  2.2  --   --   PHOS  --   --   --   --  5.7*   GFR: Estimated Creatinine Clearance: 22 mL/min (A) (by C-G formula based on  SCr of 2.88 mg/dL (H)). Liver Function Tests: No results for input(s): AST, ALT, ALKPHOS, BILITOT, PROT, ALBUMIN in the last 168 hours. No results for input(s): LIPASE, AMYLASE in the last 168 hours. No results for input(s): AMMONIA in the last 168 hours. Coagulation Profile: No results for input(s): INR, PROTIME in the last 168 hours. Cardiac Enzymes: No results for input(s): CKTOTAL, CKMB, CKMBINDEX, TROPONINI in the last 168 hours. BNP (last 3 results) No results for input(s): PROBNP in the last 8760 hours. HbA1C: No results for input(s): HGBA1C in the last 72 hours. CBG: Recent Labs  Lab 06/25/19 1633 06/25/19 2004 06/26/19 0020  06/26/19 0359 06/26/19 0926  GLUCAP 234* 279* 164* 106* 135*   Lipid Profile: No results for input(s): CHOL, HDL, LDLCALC, TRIG, CHOLHDL, LDLDIRECT in the last 72 hours. Thyroid Function Tests: No results for input(s): TSH, T4TOTAL, FREET4, T3FREE, THYROIDAB in the last 72 hours. Anemia Panel: Recent Labs    06/26/19 0412  FOLATE 8.3  FERRITIN 28  TIBC 238*  IRON 56   Sepsis Labs: No results for input(s): PROCALCITON, LATICACIDVEN in the last 168 hours.  Recent Results (from the past 240 hour(s))  SARS CORONAVIRUS 2 (TAT 6-24 HRS) Nasopharyngeal Nasopharyngeal Swab     Status: None   Collection Time: 06/20/19  9:42 AM   Specimen: Nasopharyngeal Swab  Result Value Ref Range Status   SARS Coronavirus 2 NEGATIVE NEGATIVE Final    Comment: (NOTE) SARS-CoV-2 target nucleic acids are NOT DETECTED. The SARS-CoV-2 RNA is generally detectable in upper and lower respiratory specimens during the acute phase of infection. Negative results do not preclude SARS-CoV-2 infection, do not rule out co-infections with other pathogens, and should not be used as the sole basis for treatment or other patient management decisions. Negative results must be combined with clinical observations, patient history, and epidemiological information. The expected result is Negative. Fact Sheet for Patients: SugarRoll.be Fact Sheet for Healthcare Providers: https://www.woods-mathews.com/ This test is not yet approved or cleared by the [place] FDA and  has been authorized for detection and/or diagnosis of SARS-CoV-2 by FDA under an Emergency Use Authorization (EUA). This EUA will remain  in effect (meaning this test can be used) for the duration of the COVID-19 declaration under Section 56 4(b)(1) of the Act, 21 U.S.C. section 360bbb-3(b)(1), unless the authorization is terminated or revoked sooner. Performed at [facility], [place] [address]  [place], [place] [address]          Radiology Studies: US RENAL  Result Date: 06/25/2019 CLINICAL DATA:  Chronic kidney disease stage 3. EXAM: RENAL / URINARY TRACT ULTRASOUND COMPLETE COMPARISON:  Report from renal ultrasound dated 08/08/2010 FINDINGS: Right Kidney: Renal measurements: 11.8 x 6.0 x 6.6 cm = volume: 246 mL . Echogenicity is increased. The kidney demonstrates a nodular contour. No mass or hydronephrosis visualized. Left Kidney: Renal measurements: 10.8 x 5.5 x 6.7 cm = volume: 208 mL. Echogenicity is increased. Renal cortex is thinned. No mass or hydronephrosis visualized. Bladder: Foley catheter in place.  No bladder abnormality. Other: None. IMPRESSION: Findings consistent with chronic kidney disease.  No hydronephrosis. Electronically Signed   By: Zerita Boers M.D.   On: 06/25/2019 17:28   PERIPHERAL VASCULAR CATHETERIZATION  Result Date: 06/24/2019 See Op Note       Scheduled Meds: . apixaban  2.5 mg Oral BID  . aspirin EC  81 mg Oral Daily  . felodipine  7.5 mg Oral Daily  . ferrous sulfate  325 mg  Oral Daily  . hydrALAZINE  25 mg Oral TID  . insulin aspart  0-9 Units Subcutaneous Q4H  . insulin detemir  10 Units Subcutaneous q morning - 10a  . ipratropium-albuterol  3 mL Nebulization Once  . loratadine  10 mg Oral Daily  . metoprolol tartrate  25 mg Oral BID  . multivitamin-lutein  1 capsule Oral Daily  . pravastatin  40 mg Oral q1800  . sodium chloride flush  3 mL Intravenous Q12H  . sodium chloride flush  3 mL Intravenous Q12H  . sodium chloride irrigation  30 mL Irrigation QPM   Continuous Infusions: . sodium chloride    . sodium chloride      Assessment & Plan:   Principal Problem:   Acute on chronic diastolic CHF (congestive heart failure) (HCC) Active Problems:   CKD (chronic kidney disease) stage 4, GFR 15-29 ml/min (HCC)   HLD (hyperlipidemia)   Atrial flutter (HCC)   HTN (hypertension)   Type II diabetes mellitus with renal  manifestations (HCC)   Iron deficiency anemia   Atherosclerosis of artery of extremity with ulceration (HCC)   Acute on chronic diastolic CHF (congestive heart failure) [facility]): Patient had 3+ leg edema, elevated BNP, chest x-ray showed pulmonary edema, consistent with CHF exacerbation..then euvolemic.IV lasix d/c'd On 2/19 received gentle hydration , s/p angiogram..>>>now in acute decompensated HF. Creatinine up even more after lasix given -2d echo-EF 50 to 55%, EF is low normal function.  LV with global hypokinesis.  Grade 1 diastolic dysfunction.  Please see full report Likely vol overloaded is combo of acute /ckD as below -Daily weights -strict I/O's -Low salt diet -Fluid restriction -hold lasix as below   Acute on CKD (chronic kidney disease) stage 4, GFR 15-29 ml/min (HCC):  BUN and creatinine elevated today, cr 2.88 from 1.81, worsened after 1 dose of Lasix yesterday Nephrology following Likely contrast induced on top of his chronic kidney disease//cardiorenal Baseline creatinine 1.79, GFR of 33 on 12/21/2017 Nephrology recommends holding diuretics Monitor urine output and I's and O    HLD (hyperlipidemia): -Pravastatin  Atrial flutter ([place]); -Continue metoprolol and Eliquis  HTN:  -Continue home medications: Felodipine, hydralazine, metoprolol, -hydralazine prn  Type II diabetes mellitus with renal manifestations ([place]): Most recent A1c 7.1, poorly controled. Patient is taking NovoLog and Levemir at home . Levemir dose was discontinued from 14 to 10 units and discontinue pm insulin due to hypoglycemic episode  Continue to monitor blood glucose levels closely -SSI -ck fs  Iron deficiency anemia: Hemoglobin 9.7 (10.1 on 01/25/2018) -Continue iron supplement  Atherosclerosis of artery of extremity with ulceration ([place]): -Status post abdominal aortogram, status post left lower extremity angiography third order catheter placement, StarClose right common femoral.   By Dr. Delana Meyer plan on repeat angiography with an attempt for revascularization in a few weeks.   DVT prophylaxis: d/c heparin, resume, Eliquis Code Status: Full Family Communication: None at bedside Disposition Plan: Will likely discharge when volume status and renal function are stabilizing   LOS: 5 days   Time spent: 45 minutes with more than 50% COC    Nolberto Hanlon, MD [facility] Pager [phone]  If 7PM-7AM, please contact night-coverage [email or web address] Password [facility] 06/26/2019, 11:28 AM Patient ID: Miguel Torres, male   DOB: 10/16/1930, 86 y.o.   MRN: [id number]

## 2019-06-27 ENCOUNTER — Inpatient Hospital Stay: Payer: Medicare (Managed Care)

## 2019-06-27 ENCOUNTER — Encounter: Payer: Self-pay | Admitting: Cardiology

## 2019-06-27 DIAGNOSIS — I70262 Atherosclerosis of native arteries of extremities with gangrene, left leg: Secondary | ICD-10-CM

## 2019-06-27 LAB — PROTEIN ELECTROPHORESIS, SERUM
A/G Ratio: 0.8 (ref 0.7–1.7)
Albumin ELP: 2.7 g/dL — ABNORMAL LOW (ref 2.9–4.4)
Alpha-1-Globulin: 0.3 g/dL (ref 0.0–0.4)
Alpha-2-Globulin: 0.7 g/dL (ref 0.4–1.0)
Beta Globulin: 1 g/dL (ref 0.7–1.3)
Gamma Globulin: 1.4 g/dL (ref 0.4–1.8)
Globulin, Total: 3.3 g/dL (ref 2.2–3.9)
Total Protein ELP: 6 g/dL (ref 6.0–8.5)

## 2019-06-27 LAB — BASIC METABOLIC PANEL
Anion gap: 9 (ref 5–15)
BUN: 88 mg/dL — ABNORMAL HIGH (ref 8–23)
CO2: 21 mmol/L — ABNORMAL LOW (ref 22–32)
Calcium: 7.7 mg/dL — ABNORMAL LOW (ref 8.9–10.3)
Chloride: 104 mmol/L (ref 98–111)
Creatinine, Ser: 2.94 mg/dL — ABNORMAL HIGH (ref 0.61–1.24)
GFR calc Af Amer: 21 mL/min — ABNORMAL LOW (ref >60)
GFR calc non Af Amer: 18 mL/min — ABNORMAL LOW (ref >60)
Glucose, Bld: 164 mg/dL — ABNORMAL HIGH (ref 70–99)
Potassium: 4.7 mmol/L (ref 3.5–5.1)
Sodium: 134 mmol/L — ABNORMAL LOW (ref 135–145)

## 2019-06-27 LAB — GLUCOSE, CAPILLARY
Glucose-Capillary: 150 mg/dL — ABNORMAL HIGH (ref 70–99)
Glucose-Capillary: 142 mg/dL — ABNORMAL HIGH (ref 70–99)
Glucose-Capillary: 244 mg/dL — ABNORMAL HIGH (ref 70–99)
Glucose-Capillary: 277 mg/dL — ABNORMAL HIGH (ref 70–99)
Glucose-Capillary: 261 mg/dL — ABNORMAL HIGH (ref 70–99)

## 2019-06-27 LAB — CBC
HCT: 30 % — ABNORMAL LOW (ref 39.0–52.0)
Hemoglobin: 9.3 g/dL — ABNORMAL LOW (ref 13.0–17.0)
MCH: 30.3 pg (ref 26.0–34.0)
MCHC: 31 g/dL (ref 30.0–36.0)
MCV: 97.7 fL (ref 80.0–100.0)
Platelets: 207 10*3/uL (ref 150–400)
RBC: 3.07 MIL/uL — ABNORMAL LOW (ref 4.22–5.81)
RDW: 14.1 % (ref 11.5–15.5)
WBC: 6.7 10*3/uL (ref 4.0–10.5)
nRBC: 0 % (ref 0.0–0.2)

## 2019-06-27 LAB — HEMOGLOBIN A1C
Hgb A1c MFr Bld: 7.7 % — ABNORMAL HIGH (ref 4.8–5.6)
Mean Plasma Glucose: 174.29 mg/dL

## 2019-06-27 LAB — KAPPA/LAMBDA LIGHT CHAINS
Kappa free light chain: 140.4 mg/L — ABNORMAL HIGH (ref 3.3–19.4)
Kappa, lambda light chain ratio: 1.19 (ref 0.26–1.65)
Lambda free light chains: 118.1 mg/L — ABNORMAL HIGH (ref 5.7–26.3)

## 2019-06-27 LAB — HEPATITIS C ANTIBODY: HCV Ab: <0.1 s/co ratio — AB (ref 0.0–0.9)

## 2019-06-27 LAB — PARATHYROID HORMONE, INTACT (NO CA): PTH: 117 pg/mL — ABNORMAL HIGH (ref 15–65)

## 2019-06-27 LAB — BRAIN NATRIURETIC PEPTIDE: B Natriuretic Peptide: 447 pg/mL — ABNORMAL HIGH (ref 0.0–100.0)

## 2019-06-27 MED ORDER — BUMETANIDE 0.25 MG/ML IJ SOLN
0.5000 mg | Freq: Every day | INTRAMUSCULAR | Status: DC
  Administered 2019-06-27 – 2019-07-01 (×5): 0.5 mg via INTRAVENOUS
  Filled 2019-06-27 (×5): qty 4

## 2019-06-27 NOTE — Care Management Important Message (Signed)
Important Message  Patient Details  Name: Miguel Torres MRN: [id number] Date of Birth: Sep 27, 1930   Medicare Important Message Given:  Yes     Dannette Barbara 06/27/2019, 1:22 PM

## 2019-06-27 NOTE — Progress Notes (Signed)
[facility] Daily Progress Note   Subjective: 3 Days Post-Op:             1.  Abdominal aortogram             2.  Left lower extremity angiography third order catheter placement             3.  Star close right common femoral  Patient without complaint his AM. No issues overnight.   Objective: Vitals:   06/26/19 1548 06/26/19 1921 06/27/19 0405 06/27/19 0739  BP: (!) 118/39 (!) 139/53 128/72 139/68  Pulse: 72 86 75 90  Resp: 16 20 18 18   Temp: 97.7 F (36.5 C) 98 F (36.7 C) 97.8 F (36.6 C) 98.2 F (36.8 C)  TempSrc: Oral Oral Oral   SpO2: 96% 96% 96% 97%  Weight:   111.9 kg   Height:        Intake/Output Summary (Last 24 hours) at 06/27/2019 1134 Last data filed at 06/27/2019 0920 Gross per 24 hour  Intake 240 ml  Output 700 ml  Net -460 ml   Physical Exam: A&Ox3, NAD CV: RRR Pulmonary: CTA Bilaterally Abdomen: Soft, Nontender, Nondistended Right Groin:  Access Site: clean, dry and intact Vascular:  Left Lower Extremity: Thigh soft. Calf soft. Extremity is warm distally however hard to palpate pedal pulses. Motor / sensory intact.   Laboratory: CBC    Component Value Date/Time   WBC 6.7 06/27/2019 0428   HGB 9.3 (L) 06/27/2019 0428   HGB 11.0 (L) 10/15/2016 1611   HCT 30.0 (L) 06/27/2019 0428   HCT 34.5 (L) 10/15/2016 1611   PLT 207 06/27/2019 0428   PLT 183 10/15/2016 1611   BMET    Component Value Date/Time   NA 134 (L) 06/27/2019 0428   NA 138 10/15/2016 1611   NA 140 09/30/2012 1609   K 4.7 06/27/2019 0428   K 4.5 09/30/2012 1609   CL 104 06/27/2019 0428   CL 111 (H) 09/30/2012 1609   CO2 21 (L) 06/27/2019 0428   CO2 24 09/30/2012 1609   GLUCOSE 164 (H) 06/27/2019 0428   GLUCOSE 90 09/30/2012 1609   BUN 88 (H) 06/27/2019 0428   BUN 30 (H) 10/15/2016 1611   BUN 28 (H) 09/30/2012 1609   CREATININE 2.94 (H) 06/27/2019 0428   CREATININE 1.33 (H) 09/30/2012 1609   CALCIUM 7.7 (L) 06/27/2019 0428   CALCIUM 8.5 09/30/2012  1609   GFRNONAA 18 (L) 06/27/2019 0428   GFRNONAA 50 (L) 09/30/2012 1609   GFRAA 21 (L) 06/27/2019 0428   GFRAA 58 (L) 09/30/2012 1609   Assessment/Planning: The patient is a 86 year old male with known hx of PAD who was originally scheduled for left lower extremity angiogram last week however was canceled due to CHF exacerbation, now POD#3 left lower extremity angiogram  1) On ASA and Eliquis. Plan to possible repeat angiogram in future however will be dependent on renal function. No further vascular intervention planned at this time during this  2) Creatinine now 2.9 - nephrology is following  Discussed with Dr. Eber Hong [facility] PA-C 06/27/2019 11:34 AM

## 2019-06-27 NOTE — TOC Initial Note (Signed)
Transition of Care [facility]) - Initial/Assessment Note    Patient Details  Name: Miguel Torres MRN: [id number] Date of Birth: Sep 16, 1930  [facility] CM/SW Contact:    Shelbie Ammons, RN Phone Number: 06/27/2019, 3:40 PM  Clinical Narrative:     RNCM assessed patient at bedside, patient reports he is feeling well today and that he is ready to go home when the doctor's say he is ready. Patient reports that he lives in a facility but is unable to provide name of facility and has lived there for a couple of years. Patient reports that his wife lives in same facility and that they handle all his medications, meals and he sees the MD there. Patient gives permission for this CM to call his daughter.  RNCM placed call to daughter who reports patient and his wife live at [facility] and that is where she would like for him to return.  RNCM left message for Magda Paganini at [facility] who returned call and reported that patient is a resident there and can return at discharge.       Expected Discharge Plan: Rest Home Barriers to Discharge: Continued Medical Work up   Patient Goals and CMS Choice     Choice offered to / list presented to : Patient  Expected Discharge Plan and Services Expected Discharge Plan: Rest Home   Discharge Planning Services: CM Consult   Living arrangements for the past 2 months: [place]                                      Prior Living Arrangements/Services Living arrangements for the past 2 months: [place] Lives with:: Spouse   Do you feel safe going back to the place where you live?: Yes      Need for Family Participation in Patient Care: Yes (Comment) Care giver support system in place?: Yes (comment)   Criminal Activity/Legal Involvement Pertinent to Current Situation/Hospitalization: No - Comment as needed  Activities of Daily Living Home Assistive Devices/Equipment: Wheelchair, CBG Meter ADL  Screening (condition at time of admission) Patient's cognitive ability adequate to safely complete daily activities?: Yes Is the patient deaf or have difficulty hearing?: Yes Does the patient have difficulty seeing, even when wearing glasses/contacts?: No Does the patient have difficulty concentrating, remembering, or making decisions?: No Patient able to express need for assistance with ADLs?: Yes Does the patient have difficulty dressing or bathing?: Yes Independently performs ADLs?: No Communication: Independent Dressing (OT): Needs assistance Is this a change from baseline?: Pre-admission baseline Grooming: Needs assistance Is this a change from baseline?: Pre-admission baseline Feeding: Independent Bathing: Needs assistance Is this a change from baseline?: Pre-admission baseline Toileting: Needs assistance Is this a change from baseline?: Pre-admission baseline In/Out Bed: Needs assistance Is this a change from baseline?: Pre-admission baseline Walks in Home: Needs assistance Is this a change from baseline?: Pre-admission baseline Does the patient have difficulty walking or climbing stairs?: Yes Weakness of Legs: Both Weakness of Arms/Hands: Both  Permission Sought/Granted                  Emotional Assessment Appearance:: Appears stated age Attitude/Demeanor/Rapport: Engaged Affect (typically observed): Appropriate Orientation: : Oriented to Self, Oriented to Place, Oriented to  Time, Oriented to Situation Alcohol / Substance Use: Not Applicable Psych Involvement: No (comment)  Admission diagnosis:  Acute on chronic diastolic CHF (congestive heart  failure) [facility]) [I50.33] Patient Active Problem List   Diagnosis Date Noted  . Acute on chronic diastolic CHF (congestive heart failure) ([place]) 06/21/2019  . HTN (hypertension) 06/21/2019  . Type II diabetes mellitus with renal manifestations ([place]) 06/21/2019  . Iron deficiency anemia 06/21/2019  . Atherosclerosis of  artery of extremity with ulceration ([place])   . Venous ulcer of left lower extremity without varicose veins ([place]) 06/13/2019  . S/P BKA (below knee amputation) unilateral, right ([place]) 05/18/2019  . Atherosclerosis of native arteries of the extremities with ulceration ([place]) 03/14/2019  . Resides in skilled nursing facility 02/24/2018  . Benign prostatic hyperplasia with urinary obstruction 12/23/2017  . Urinary retention 12/18/2017  . Acute diastolic (congestive) heart failure ([place]) 12/17/2017  . Goals of care, counseling/discussion   . Palliative care encounter   . Congestive heart failure (CHF) ([place]) 12/16/2017  . Acute diastolic CHF (congestive heart failure) ([place]) 12/16/2017  . Acute kidney injury ([place]) 12/07/2017  . Foot ulceration, left, with fat layer exposed ([place]) 11/23/2017  . Lymphedema 11/23/2017  . Shoulder pain, right 11/04/2017  . Pressure injury of skin 11/02/2017  . Elevated troponin 11/01/2017  . Microalbuminuria 10/15/2016  . BCC (basal cell carcinoma), face 06/24/2016  . Vitamin D deficiency 07/12/2015  . Retinopathy, diabetic, proliferative ([place]) 07/02/2015  . Bleeding duodenal ulcer 02/02/2015  . Atrial flutter ([place]) 01/19/2015  . Allergic rhinitis 01/17/2015  . Anemia of chronic disease 01/17/2015  . CKD (chronic kidney disease) stage 4, GFR 15-29 ml/min (HCC) 01/17/2015  . Diabetes mellitus with nephropathy ([place]) 01/17/2015  . Edema 01/17/2015  . Gout 01/17/2015  . Amputation of right lower extremity below knee upon examination ([place]) 01/17/2015  . HLD (hyperlipidemia) 01/17/2015  . Psoriasis 01/17/2015  . CA of skin 01/17/2015  . Acquired absence of right leg below knee ([place]) 01/17/2015  . Hypertension 10/27/2014  . Mild cognitive disorder 12/05/2013  . Postop check 02/10/2013  . Cancer of skin, squamous cell 01/21/2013  . Squamous cell carcinoma of scalp and skin of neck 03/03/2011   PCP:  [name], MD Pharmacy:   [facility], [place]. [place] [address] Phone: [phone] Fax: [phone]  [id number] - [place], [place] - [address] [place] [address] Phone: [phone] Fax: [phone]     Social Determinants of Health (SDOH) Interventions    Readmission Risk Interventions No flowsheet data found.

## 2019-06-27 NOTE — Progress Notes (Addendum)
Miguel Torres  RFF:[id number] DOB: 10-01-1930 DOA: 06/21/2019 PCP: [name], MD    Brief Narrative:  Miguel Torres is a 86 y.o. male with medical history significant of hypertension, hyperlipidemia, diabetes mellitus, dCHF, iron deficiency anemia, CKD-4, s/p of right BKA, atrial flutter on Eliquis, who presents with shortness breath, leg edema.   Pt has hx of atherosclerosis of native arteries of the extremities with ulceration. He is scheduled for angiography of theleftlower extremitywith the hope for intervention for limb salvage by VVS, however pt is found to have SOB and severe leg edema, concerning for CHF exacerbation. The procedure is canceled and we are asked to treat his CHF exacerbation.  Patient states that he has dry cough, shortness of breath, no chest pain, fever or chills.  No nausea vomiting, diarrhea, abdominal pain, symptoms of UTI or unilateral weakness.  He has a severe bilateral leg edema.    Consultants:   None  Procedures: That is post abdominal aortogram, status post left lower extremity angiography third order catheter placement, StarClose right common femoral by vascular surgery on 06/24/2019  Antimicrobials:   None   Subjective: Pt reports less short of breath and feeling better however watching him he is short of breath more than yesterday and become short of breath with conversation.  Objective: Vitals:   06/26/19 1921 06/27/19 0405 06/27/19 0739 06/27/19 1207  BP: (!) 139/53 128/72 139/68 (!) 146/71  Pulse: 86 75 90 90  Resp: 20 18 18 18   Temp: 98 F (36.7 C) 97.8 F (36.6 C) 98.2 F (36.8 C) 98.6 F (37 C)  TempSrc: Oral Oral    SpO2: 96% 96% 97% 94%  Weight:  111.9 kg    Height:        Intake/Output Summary (Last 24 hours) at 06/27/2019 1439 Last data filed at 06/27/2019 1315 Gross per 24 hour  Intake 480 ml  Output 400 ml  Net 80 ml   Filed Weights   06/25/19 0422 06/26/19 0400 06/27/19 0405   Weight: 109.6 kg 108.7 kg 111.9 kg    Examination:  General exam: Appears calm, but becomes tachypnic with conversation Respiratory system: fine rales b/l ,  + expiratory  wheezing , no  rhonchi Cardiovascular system: S1 & S2 heard, RRR.  No murmurs, rubs, gallops or clicks.  Gastrointestinal system: Abdomen is nondistended, soft and nontender. Normal bowel sounds heard.+edema Central nervous system: awake and alert and oriented.  Grossly intact GU: scrotal swelling   Extremities:right BKA with bilateral skin changes,+ b/l edema Skin: Warm dry Psychiatry:  Mood & affect appropriate for current setting.     Data Reviewed: I have personally reviewed following labs and imaging studies  CBC: Recent Labs  Lab 06/21/19 1453 06/24/19 0459 06/26/19 0412 06/27/19 0428  WBC 8.1 6.4 7.1 6.7  HGB 9.7* 9.2* 9.0* 9.3*  HCT 30.2* 29.9* 29.5* 30.0*  MCV 96.8 98.0 101.0* 97.7  PLT 228 218 197 357   Basic Metabolic Panel: Recent Labs  Lab 06/23/19 0402 06/24/19 0459 06/25/19 0534 06/26/19 0412 06/27/19 0428  NA 138 138 137 133* 134*  K 4.4 4.4 5.4* 4.6 4.7  CL 108 107 104 104 104  CO2 23 25 20* 19* 21*  GLUCOSE 119* 98 149* 112* 164*  BUN 57* 55* 71* 80* 88*  CREATININE 1.87* 1.81* 2.37* 2.88* 2.94*  CALCIUM 7.9* 7.9* 8.3* 7.8* 7.7*  MG  --  2.2  --   --   --  PHOS  --   --   --  5.7*  --    GFR: Estimated Creatinine Clearance: 21.9 mL/min (A) (by C-G formula based on SCr of 2.94 mg/dL (H)). Liver Function Tests: No results for input(s): AST, ALT, ALKPHOS, BILITOT, PROT, ALBUMIN in the last 168 hours. No results for input(s): LIPASE, AMYLASE in the last 168 hours. No results for input(s): AMMONIA in the last 168 hours. Coagulation Profile: No results for input(s): INR, PROTIME in the last 168 hours. Cardiac Enzymes: No results for input(s): CKTOTAL, CKMB, CKMBINDEX, TROPONINI in the last 168 hours. BNP (last 3 results) No results for input(s): PROBNP in the last 8760  hours. HbA1C: Recent Labs    06/27/19 0422  HGBA1C 7.7*   CBG: Recent Labs  Lab 06/26/19 2101 06/26/19 2347 06/27/19 0406 06/27/19 0740 06/27/19 1208  GLUCAP 273* 206* 150* 142* 244*   Lipid Profile: No results for input(s): CHOL, HDL, LDLCALC, TRIG, CHOLHDL, LDLDIRECT in the last 72 hours. Thyroid Function Tests: No results for input(s): TSH, T4TOTAL, FREET4, T3FREE, THYROIDAB in the last 72 hours. Anemia Panel: Recent Labs    06/26/19 0412 06/26/19 1208  VITAMINB12  --  363  FOLATE 8.3  --   FERRITIN 28  --   TIBC 238*  --   IRON 56  --    Sepsis Labs: No results for input(s): PROCALCITON, LATICACIDVEN in the last 168 hours.  Recent Results (from the past 240 hour(s))  SARS CORONAVIRUS 2 (TAT 6-24 HRS) Nasopharyngeal Nasopharyngeal Swab     Status: None   Collection Time: 06/20/19  9:42 AM   Specimen: Nasopharyngeal Swab  Result Value Ref Range Status   SARS Coronavirus 2 NEGATIVE NEGATIVE Final    Comment: (NOTE) SARS-CoV-2 target nucleic acids are NOT DETECTED. The SARS-CoV-2 RNA is generally detectable in upper and lower respiratory specimens during the acute phase of infection. Negative results do not preclude SARS-CoV-2 infection, do not rule out co-infections with other pathogens, and should not be used as the sole basis for treatment or other patient management decisions. Negative results must be combined with clinical observations, patient history, and epidemiological information. The expected result is Negative. Fact Sheet for Patients: SugarRoll.be Fact Sheet for Healthcare Providers: https://www.woods-mathews.com/ This test is not yet approved or cleared by the [place] FDA and  has been authorized for detection and/or diagnosis of SARS-CoV-2 by FDA under an Emergency Use Authorization (EUA). This EUA will remain  in effect (meaning this test can be used) for the duration of the COVID-19 declaration  under Section 56 4(b)(1) of the Act, 21 U.S.C. section 360bbb-3(b)(1), unless the authorization is terminated or revoked sooner. Performed at [facility], [place] [address] [place], [place] [address]          Radiology Studies: US RENAL  Result Date: 06/25/2019 CLINICAL DATA:  Chronic kidney disease stage 3. EXAM: RENAL / URINARY TRACT ULTRASOUND COMPLETE COMPARISON:  Report from renal ultrasound dated 08/08/2010 FINDINGS: Right Kidney: Renal measurements: 11.8 x 6.0 x 6.6 cm = volume: 246 mL . Echogenicity is increased. The kidney demonstrates a nodular contour. No mass or hydronephrosis visualized. Left Kidney: Renal measurements: 10.8 x 5.5 x 6.7 cm = volume: 208 mL. Echogenicity is increased. Renal cortex is thinned. No mass or hydronephrosis visualized. Bladder: Foley catheter in place.  No bladder abnormality. Other: None. IMPRESSION: Findings consistent with chronic kidney disease.  No hydronephrosis. Electronically Signed   By: Zerita Boers M.D.   On: 06/25/2019 17:28  Scheduled Meds: . apixaban  2.5 mg Oral BID  . aspirin EC  81 mg Oral Daily  . bumetanide (BUMEX) IV  0.5 mg Intravenous Daily  . felodipine  7.5 mg Oral Daily  . ferrous sulfate  325 mg Oral Daily  . hydrALAZINE  25 mg Oral TID  . insulin aspart  0-9 Units Subcutaneous Q4H  . insulin detemir  10 Units Subcutaneous q morning - 10a  . ipratropium-albuterol  3 mL Nebulization Once  . loratadine  10 mg Oral Daily  . metoprolol tartrate  25 mg Oral BID  . multivitamin-lutein  1 capsule Oral Daily  . pravastatin  40 mg Oral q1800  . sodium chloride flush  3 mL Intravenous Q12H  . sodium chloride flush  3 mL Intravenous Q12H  . sodium chloride irrigation  30 mL Irrigation QPM   Continuous Infusions: . sodium chloride    . sodium chloride      Assessment & Plan:   Principal Problem:   Acute on chronic diastolic CHF (congestive heart failure) (HCC) Active Problems:   CKD (chronic kidney  disease) stage 4, GFR 15-29 ml/min (HCC)   HLD (hyperlipidemia)   Atrial flutter (HCC)   HTN (hypertension)   Type II diabetes mellitus with renal manifestations (HCC)   Iron deficiency anemia   Atherosclerosis of artery of extremity with ulceration (HCC)   Acute on chronic diastolic CHF (congestive heart failure) [facility]): Patient had 3+ leg edema, elevated BNP, chest x-ray showed pulmonary edema, consistent with CHF exacerbation..then euvolemic.IV lasix d/c'd On 2/19 received gentle hydration , s/p angiogram..>>>now in acute decompensated HF. Creatinine up even more after lasix given -2d echo-EF 50 to 55%, EF is low normal function.  LV with global hypokinesis.  Grade 1 diastolic dysfunction.  Please see full report -Volume  overloaded is combo of acute /ckD and a/cdhf- spoke to nephrology today about pt's vol. Status, they are in agreement about initiating bumex.  -Daily weights -strict I/O's -Low salt diet -Fluid restriction 1500 Start bumex iv Ck cxr, bnp   Acute on CKD (chronic kidney disease) stage 4, GFR 15-29 ml/min (HCC):  BUN and creatinine elevated today, cr 2.88 from 1.81, worsened after 1 dose of Lasix yesterday Nephrology following Likely contrast induced on top of his chronic kidney disease//cardiorenal Baseline creatinine 1.79, GFR of 33 on 12/21/2017 Nephrolgy following, starting Bumex 0.5 mg IV daily Monitor urine output and I's and O    HLD (hyperlipidemia): -Pravastatin  Atrial flutter ([place]); -Continue metoprolol and Eliquis  HTN:  -Continue home medications: Felodipine, hydralazine, metoprolol, -hydralazine prn  Type II diabetes mellitus with renal manifestations ([place]): Most recent A1c 7.1, poorly controled. Patient is taking NovoLog and Levemir at home . Levemir dose was discontinued from 14 to 10 units and discontinue pm insulin due to hypoglycemic episode  Continue to monitor blood glucose levels closely -SSI -ck fs  Iron deficiency anemia:  Hemoglobin 9.7 (10.1 on 01/25/2018) -Continue iron supplement  Atherosclerosis of artery of extremity with ulceration ([place]): -Status post abdominal aortogram, status post left lower extremity angiography third order catheter placement, StarClose right common femoral.  By Dr. Delana Meyer plan on repeat angiography with an attempt for revascularization in a few weeks if renal function allows.   DVT prophylaxis: d/c heparin, resume, Eliquis Code Status: Dnr-per daughter who states its on file here Family Communication: Daughter updated Disposition Plan: Will likely discharge when volume status and renal function are stabilizing, currently requiring iv bumex   LOS: 6 days  Time spent: 45 minutes with more than 50% COC    Nolberto Hanlon, MD [facility] Pager [phone]  If 7PM-7AM, please contact night-coverage [email or web address] Password [facility] 06/27/2019, 2:39 PM Patient ID: HENOK HEACOCK, male   DOB: 23-Oct-1930, 86 y.o.   MRN: [id number]

## 2019-06-27 NOTE — Plan of Care (Signed)
  Problem: Education: Goal: Knowledge of General Education information will improve Description: Including pain rating scale, medication(s)/side effects and non-pharmacologic comfort measures Outcome: Progressing   Problem: Clinical Measurements: Goal: Respiratory complications will improve Outcome: Progressing   Problem: Safety: Goal: Ability to remain free from injury will improve Outcome: Progressing   

## 2019-06-27 NOTE — Progress Notes (Signed)
[facility]  ROUNDING NOTE   Subjective:   Patient states that he is feeling better. However more short of breath and using accessory muscles to breath.   UOP 200 reported. No diuretic in last 24 hours  Creatinine 2.94 (2.88) (2.37)  Objective:  Vital signs in last 24 hours:  Temp:  [97.7 F (36.5 C)-98.2 F (36.8 C)] 98.2 F (36.8 C) (02/22 0739) Pulse Rate:  [72-90] 90 (02/22 0739) Resp:  [16-20] 18 (02/22 0739) BP: (118-139)/(39-72) 139/68 (02/22 0739) SpO2:  [96 %-97 %] 97 % (02/22 0739) Weight:  [111.9 kg] 111.9 kg (02/22 0405)  Weight change: 3.2 kg Filed Weights   06/25/19 0422 06/26/19 0400 06/27/19 0405  Weight: 109.6 kg 108.7 kg 111.9 kg    Intake/Output: I/O last 3 completed shifts: In: 360 [P.O.:360] Out: 800 [Urine:800]   Intake/Output this shift:  Total I/O In: 240 [P.O.:240] Out: -   Physical Exam: General: NAD, laying in bed  Head: Normocephalic, atraumatic. Moist oral mucosal membranes  Eyes: Anicteric, PERRL  Neck: Supple, trachea midline  Lungs:  Crackles at bases  Heart: irregular  Abdomen:  Soft, nontender, obese  Extremities:  ++ peripheral edema.  Neurologic: Nonfocal, moving all four extremities  Skin: No lesions  GU: +scrotal edema    Basic Metabolic Panel: Recent Labs  Lab 06/23/19 0402 06/23/19 0402 06/24/19 0459 06/24/19 0459 06/25/19 0534 06/26/19 0412 06/27/19 0428  NA 138  --  138  --  137 133* 134*  K 4.4  --  4.4  --  5.4* 4.6 4.7  CL 108  --  107  --  104 104 104  CO2 23  --  25  --  20* 19* 21*  GLUCOSE 119*  --  98  --  149* 112* 164*  BUN 57*  --  55*  --  71* 80* 88*  CREATININE 1.87*  --  1.81*  --  2.37* 2.88* 2.94*  CALCIUM 7.9*   < > 7.9*   < > 8.3* 7.8* 7.7*  MG  --   --  2.2  --   --   --   --   PHOS  --   --   --   --   --  5.7*  --    < > = values in this interval not displayed.    Liver Function Tests: No results for input(s): AST, ALT, ALKPHOS, BILITOT, PROT, ALBUMIN in the last  168 hours. No results for input(s): LIPASE, AMYLASE in the last 168 hours. No results for input(s): AMMONIA in the last 168 hours.  CBC: Recent Labs  Lab 06/21/19 1453 06/24/19 0459 06/26/19 0412 06/27/19 0428  WBC 8.1 6.4 7.1 6.7  HGB 9.7* 9.2* 9.0* 9.3*  HCT 30.2* 29.9* 29.5* 30.0*  MCV 96.8 98.0 101.0* 97.7  PLT 228 218 197 207    Cardiac Enzymes: No results for input(s): CKTOTAL, CKMB, CKMBINDEX, TROPONINI in the last 168 hours.  BNP: Invalid input(s): POCBNP  CBG: Recent Labs  Lab 06/26/19 2017 06/26/19 2101 06/26/19 2347 06/27/19 0406 06/27/19 0740  GLUCAP 242* 273* 206* 150* 142*    Microbiology: Results for orders placed or performed during the hospital encounter of 06/20/19  SARS CORONAVIRUS 2 (TAT 6-24 HRS) Nasopharyngeal Nasopharyngeal Swab     Status: None   Collection Time: 06/20/19  9:42 AM   Specimen: Nasopharyngeal Swab  Result Value Ref Range Status   SARS Coronavirus 2 NEGATIVE NEGATIVE Final    Comment: (NOTE) SARS-CoV-2 target  nucleic acids are NOT DETECTED. The SARS-CoV-2 RNA is generally detectable in upper and lower respiratory specimens during the acute phase of infection. Negative results do not preclude SARS-CoV-2 infection, do not rule out co-infections with other pathogens, and should not be used as the sole basis for treatment or other patient management decisions. Negative results must be combined with clinical observations, patient history, and epidemiological information. The expected result is Negative. Fact Sheet for Patients: SugarRoll.be Fact Sheet for Healthcare Providers: https://www.woods-mathews.com/ This test is not yet approved or cleared by the [place] FDA and  has been authorized for detection and/or diagnosis of SARS-CoV-2 by FDA under an Emergency Use Authorization (EUA). This EUA will remain  in effect (meaning this test can be used) for the duration of the COVID-19  declaration under Section 56 4(b)(1) of the Act, 21 U.S.C. section 360bbb-3(b)(1), unless the authorization is terminated or revoked sooner. Performed at [facility], [place] [address], [place],  [address]     Coagulation Studies: No results for input(s): LABPROT, INR in the last 72 hours.  Urinalysis: Recent Labs    06/25/19 1347  COLORURINE YELLOW  LABSPEC 1.023  PHURINE 7.0  GLUCOSEU NEGATIVE  HGBUR SMALL*  BILIRUBINUR NEGATIVE  KETONESUR NEGATIVE  PROTEINUR 100*  NITRITE NEGATIVE  LEUKOCYTESUR MODERATE*      Imaging: US RENAL  Result Date: 06/25/2019 CLINICAL DATA:  Chronic kidney disease stage 3. EXAM: RENAL / URINARY TRACT ULTRASOUND COMPLETE COMPARISON:  Report from renal ultrasound dated 08/08/2010 FINDINGS: Right Kidney: Renal measurements: 11.8 x 6.0 x 6.6 cm = volume: 246 mL . Echogenicity is increased. The kidney demonstrates a nodular contour. No mass or hydronephrosis visualized. Left Kidney: Renal measurements: 10.8 x 5.5 x 6.7 cm = volume: 208 mL. Echogenicity is increased. Renal cortex is thinned. No mass or hydronephrosis visualized. Bladder: Foley catheter in place.  No bladder abnormality. Other: None. IMPRESSION: Findings consistent with chronic kidney disease.  No hydronephrosis. Electronically Signed   By: Zerita Boers M.D.   On: 06/25/2019 17:28     Medications:   . sodium chloride    . sodium chloride     . apixaban  2.5 mg Oral BID  . aspirin EC  81 mg Oral Daily  . bumetanide (BUMEX) IV  0.5 mg Intravenous Daily  . felodipine  7.5 mg Oral Daily  . ferrous sulfate  325 mg Oral Daily  . hydrALAZINE  25 mg Oral TID  . insulin aspart  0-9 Units Subcutaneous Q4H  . insulin detemir  10 Units Subcutaneous q morning - 10a  . ipratropium-albuterol  3 mL Nebulization Once  . loratadine  10 mg Oral Daily  . metoprolol tartrate  25 mg Oral BID  . multivitamin-lutein  1 capsule Oral Daily  . pravastatin  40 mg Oral q1800  . sodium  chloride flush  3 mL Intravenous Q12H  . sodium chloride flush  3 mL Intravenous Q12H  . sodium chloride irrigation  30 mL Irrigation QPM   sodium chloride, sodium chloride, acetaminophen, hydrALAZINE, morphine injection, ondansetron (ZOFRAN) IV, oxyCODONE, sodium chloride flush, sodium chloride flush  Assessment/ Plan:  Miguel Torres is a 86 y.o. white male with insulin dependent diabetes, hypertension, COPD, peripheral vascular disease, congestive heart failure, hyperlipidemia, anemia, right below the knee amputation, CVA, gout, atrial fibrillation on eliquis, who was admitted to [facility] on 06/21/2019 for Acute on chronic diastolic CHF (congestive heart failure) ([place]) [I50.33]   Underwent left lower extremity angiogram on 2/19.  1. Acute renal failure: with hyperkalemia on chronic kidney disease stage IIIB with proteinuria. Baseline creatinine of 1.79, GFR of 33 on 12/21/2017.  Chronic kidney disease secondary to diabetic nephropathy Contrast exposure and underlying cardio-renal syndrome.   2. Hypertension and acute exacerbation of diastolic congestive heart failure: then given IV fluids due to angiogram on 2/19. Now with volume overload with significant pulmonary, scrotal and pedal edema on examination.  Continue felodipine, metoprolol, hydralazine - start bumetanide 0.5mg  IV daily.  - Monitor volume status  3. Diabetes mellitus type II with chronic kidney disease: insulin dependent. Hemoglobin A1c of 7.7%   4. Anemia with chronic kidney disease: hemoglobin 9. macrocytic. iron studies, vitamin W03 and folic acid at goal.    5. Hyponatremia: hypervolemic secondary to volume overload from acute exacerbation of diastolic congestive heart failure.    LOS: 6 Miguel Torres 2/22/202111:21 AM

## 2019-06-28 LAB — BASIC METABOLIC PANEL
Anion gap: 8 (ref 5–15)
BUN: 75 mg/dL — ABNORMAL HIGH (ref 8–23)
CO2: 23 mmol/L (ref 22–32)
Calcium: 7.9 mg/dL — ABNORMAL LOW (ref 8.9–10.3)
Chloride: 104 mmol/L (ref 98–111)
Creatinine, Ser: 2.47 mg/dL — ABNORMAL HIGH (ref 0.61–1.24)
GFR calc Af Amer: 26 mL/min — ABNORMAL LOW (ref >60)
GFR calc non Af Amer: 23 mL/min — ABNORMAL LOW (ref >60)
Glucose, Bld: 142 mg/dL — ABNORMAL HIGH (ref 70–99)
Potassium: 4.4 mmol/L (ref 3.5–5.1)
Sodium: 135 mmol/L (ref 135–145)

## 2019-06-28 LAB — CBC
HCT: 29.4 % — ABNORMAL LOW (ref 39.0–52.0)
Hemoglobin: 9.3 g/dL — ABNORMAL LOW (ref 13.0–17.0)
MCH: 31 pg (ref 26.0–34.0)
MCHC: 31.6 g/dL (ref 30.0–36.0)
MCV: 98 fL (ref 80.0–100.0)
Platelets: 193 10*3/uL (ref 150–400)
RBC: 3 MIL/uL — ABNORMAL LOW (ref 4.22–5.81)
RDW: 14 % (ref 11.5–15.5)
WBC: 6.6 10*3/uL (ref 4.0–10.5)
nRBC: 0 % (ref 0.0–0.2)

## 2019-06-28 LAB — GLUCOSE, CAPILLARY
Glucose-Capillary: 123 mg/dL — ABNORMAL HIGH (ref 70–99)
Glucose-Capillary: 138 mg/dL — ABNORMAL HIGH (ref 70–99)
Glucose-Capillary: 152 mg/dL — ABNORMAL HIGH (ref 70–99)
Glucose-Capillary: 172 mg/dL — ABNORMAL HIGH (ref 70–99)
Glucose-Capillary: 173 mg/dL — ABNORMAL HIGH (ref 70–99)
Glucose-Capillary: 190 mg/dL — ABNORMAL HIGH (ref 70–99)

## 2019-06-28 LAB — HEPATITIS B SURFACE ANTIBODY,QUALITATIVE: Hep B S Ab: NONREACTIVE

## 2019-06-28 LAB — HEPATITIS B CORE ANTIBODY, IGM: Hep B C IgM: NEGATIVE — AB

## 2019-06-28 LAB — HEPATITIS B SURFACE ANTIGEN: Hepatitis B Surface Ag: NEGATIVE — AB

## 2019-06-28 MED ORDER — FELODIPINE ER 5 MG PO TB24: 7.5000 mg | ORAL_TABLET | Freq: Every day | ORAL | Status: DC

## 2019-06-28 MED ORDER — FELODIPINE ER 5 MG PO TB24
7.5000 mg | ORAL_TABLET | Freq: Every day | ORAL | Status: DC
  Administered 2019-06-28 – 2019-07-01 (×4): 7.5 mg via ORAL
  Filled 2019-06-28 (×4): qty 1

## 2019-06-28 NOTE — Progress Notes (Signed)
FSBS 138, patient denies need for scheduled 1 unit. Will continue to monitor.

## 2019-06-28 NOTE — Plan of Care (Signed)
  Problem: Pain Managment: Goal: General experience of comfort will improve Outcome: Progressing   

## 2019-06-28 NOTE — Progress Notes (Signed)
[facility]  ROUNDING NOTE   Subjective:   Bumetanide 0.5mg  IV x 1 yesterday. UOP 1276mL.   Creatinine 2.47 ( 2.94) (2.88) (2.37)  Objective:  Vital signs in last 24 hours:  Temp:  [97.8 F (36.6 C)-98.6 F (37 C)] 97.8 F (36.6 C) (02/23 0502) Pulse Rate:  [78-96] 90 (02/23 0720) Resp:  [17-20] 17 (02/23 0720) BP: (123-147)/(65-74) 147/71 (02/23 0720) SpO2:  [94 %-98 %] 97 % (02/23 0720) Weight:  [111.5 kg] 111.5 kg (02/23 0502)  Weight change: -0.406 kg Filed Weights   06/26/19 0400 06/27/19 0405 06/28/19 0502  Weight: 108.7 kg 111.9 kg 111.5 kg    Intake/Output: I/O last 3 completed shifts: In: 480 [P.O.:480] Out: 1650 [Urine:1650]   Intake/Output this shift:  No intake/output data recorded.  Physical Exam: General: NAD, laying in bed  Head: Normocephalic, atraumatic. Moist oral mucosal membranes  Eyes: Anicteric, PERRL  Neck: Supple, trachea midline  Lungs:  Crackles at bases  Heart: irregular  Abdomen:  Soft, nontender, obese  Extremities:  + peripheral edema. Right BKA  Neurologic: Nonfocal, moving all four extremities  Skin: No lesions  GU: +scrotal edema    Basic Metabolic Panel: Recent Labs  Lab 06/24/19 0459 06/24/19 0459 06/25/19 0534 06/25/19 0534 06/26/19 0412 06/27/19 0428 06/28/19 0548  NA 138  --  137  --  133* 134* 135  K 4.4  --  5.4*  --  4.6 4.7 4.4  CL 107  --  104  --  104 104 104  CO2 25  --  20*  --  19* 21* 23  GLUCOSE 98  --  149*  --  112* 164* 142*  BUN 55*  --  71*  --  80* 88* 75*  CREATININE 1.81*  --  2.37*  --  2.88* 2.94* 2.47*  CALCIUM 7.9*   < > 8.3*   < > 7.8* 7.7* 7.9*  MG 2.2  --   --   --   --   --   --   PHOS  --   --   --   --  5.7*  --   --    < > = values in this interval not displayed.    Liver Function Tests: No results for input(s): AST, ALT, ALKPHOS, BILITOT, PROT, ALBUMIN in the last 168 hours. No results for input(s): LIPASE, AMYLASE in the last 168 hours. No results for  input(s): AMMONIA in the last 168 hours.  CBC: Recent Labs  Lab 06/21/19 1453 06/24/19 0459 06/26/19 0412 06/27/19 0428 06/28/19 0548  WBC 8.1 6.4 7.1 6.7 6.6  HGB 9.7* 9.2* 9.0* 9.3* 9.3*  HCT 30.2* 29.9* 29.5* 30.0* 29.4*  MCV 96.8 98.0 101.0* 97.7 98.0  PLT 228 218 197 207 193    Cardiac Enzymes: No results for input(s): CKTOTAL, CKMB, CKMBINDEX, TROPONINI in the last 168 hours.  BNP: Invalid input(s): POCBNP  CBG: Recent Labs  Lab 06/27/19 1644 06/27/19 2018 06/28/19 0043 06/28/19 0432 06/28/19 0739  GLUCAP 277* 261* 152* 138* 123*    Microbiology: Results for orders placed or performed during the hospital encounter of 06/20/19  SARS CORONAVIRUS 2 (TAT 6-24 HRS) Nasopharyngeal Nasopharyngeal Swab     Status: None   Collection Time: 06/20/19  9:42 AM   Specimen: Nasopharyngeal Swab  Result Value Ref Range Status   SARS Coronavirus 2 NEGATIVE NEGATIVE Final    Comment: (NOTE) SARS-CoV-2 target nucleic acids are NOT DETECTED. The SARS-CoV-2 RNA is generally detectable in upper  and lower respiratory specimens during the acute phase of infection. Negative results do not preclude SARS-CoV-2 infection, do not rule out co-infections with other pathogens, and should not be used as the sole basis for treatment or other patient management decisions. Negative results must be combined with clinical observations, patient history, and epidemiological information. The expected result is Negative. Fact Sheet for Patients: SugarRoll.be Fact Sheet for Healthcare Providers: https://www.woods-mathews.com/ This test is not yet approved or cleared by the [place] FDA and  has been authorized for detection and/or diagnosis of SARS-CoV-2 by FDA under an Emergency Use Authorization (EUA). This EUA will remain  in effect (meaning this test can be used) for the duration of the COVID-19 declaration under Section 56 4(b)(1) of the Act, 21  U.S.C. section 360bbb-3(b)(1), unless the authorization is terminated or revoked sooner. Performed at [facility], [place] [address], [place], [place] [address]     Coagulation Studies: No results for input(s): LABPROT, INR in the last 72 hours.  Urinalysis: Recent Labs    06/25/19 1347  COLORURINE YELLOW  LABSPEC 1.023  PHURINE 7.0  GLUCOSEU NEGATIVE  HGBUR SMALL*  BILIRUBINUR NEGATIVE  KETONESUR NEGATIVE  PROTEINUR 100*  NITRITE NEGATIVE  LEUKOCYTESUR MODERATE*      Imaging: DG Chest Port 1 View  Result Date: 06/27/2019 CLINICAL DATA:  Congestive heart failure. EXAM: PORTABLE CHEST 1 VIEW COMPARISON:  Chest x-ray dated June 21, 2019. FINDINGS: Unchanged mild cardiomegaly. Unchanged mild diffuse interstitial thickening and trace bilateral pleural effusions. No pneumothorax. No acute osseous abnormality. IMPRESSION: 1. Unchanged mild congestive heart failure. Electronically Signed   By: Titus Dubin M.D.   On: 06/27/2019 15:22     Medications:   . sodium chloride    . sodium chloride     . apixaban  2.5 mg Oral BID  . aspirin EC  81 mg Oral Daily  . bumetanide (BUMEX) IV  0.5 mg Intravenous Daily  . felodipine  7.5 mg Oral Daily  . ferrous sulfate  325 mg Oral Daily  . hydrALAZINE  25 mg Oral TID  . insulin aspart  0-9 Units Subcutaneous Q4H  . insulin detemir  10 Units Subcutaneous q morning - 10a  . ipratropium-albuterol  3 mL Nebulization Once  . loratadine  10 mg Oral Daily  . metoprolol tartrate  25 mg Oral BID  . multivitamin-lutein  1 capsule Oral Daily  . pravastatin  40 mg Oral q1800  . sodium chloride flush  3 mL Intravenous Q12H  . sodium chloride flush  3 mL Intravenous Q12H  . sodium chloride irrigation  30 mL Irrigation QPM   sodium chloride, sodium chloride, acetaminophen, hydrALAZINE, morphine injection, ondansetron (ZOFRAN) IV, oxyCODONE, sodium chloride flush, sodium chloride flush  Assessment/ Plan:  Miguel Torres is a 86 y.o. white male with insulin dependent diabetes, hypertension, COPD, peripheral vascular disease, congestive heart failure, hyperlipidemia, anemia, right below the knee amputation, CVA, gout, atrial fibrillation on eliquis, who was admitted to [facility] on 06/21/2019 for Acute on chronic diastolic CHF (congestive heart failure) ([place]) [I50.33]   Underwent left lower extremity angiogram on 2/19.   1. Acute renal failure: with hyperkalemia on chronic kidney disease stage IIIB with proteinuria. Baseline creatinine of 1.79, GFR of 33 on 12/21/2017.  Chronic kidney disease secondary to diabetic nephropathy Acute renal failure secondary to contrast exposure and underlying cardio-renal syndrome.   2. Hypertension and acute exacerbation of diastolic congestive heart failure: then given IV fluids due to angiogram on  2/19. Now with volume overload with significant pulmonary, scrotal and pedal edema on examination.  Continue felodipine, metoprolol, hydralazine - Increase bumetanide to 1 mg IV daily.  - Monitor volume status  3. Diabetes mellitus type II with chronic kidney disease: insulin dependent. Hemoglobin A1c of 7.7%   4. Anemia with chronic kidney disease: hemoglobin 9.3.  iron studies, vitamin J49 and folic acid at goal.    5. Hyponatremia: hypervolemic secondary to volume overload from acute exacerbation of diastolic congestive heart failure.  - improving with loop diuretics.    LOS: 7 Seretha Estabrooks 2/23/20219:03 AM

## 2019-06-28 NOTE — Progress Notes (Addendum)
PROGRESS NOTE    Miguel Torres  AYT:[id number] DOB: [birth year] DOA: 06/21/2019 PCP: [name], MD    Brief Narrative:  Miguel Torres is a 86 y.o. male with medical history significant of hypertension, hyperlipidemia, diabetes mellitus, dCHF, iron deficiency anemia, CKD-4, s/p of right BKA, atrial flutter on Eliquis, who presents with shortness breath, leg edema.   Pt has hx of atherosclerosis of native arteries of the extremities with ulceration. He is scheduled for angiography of theleftlower extremitywith the hope for intervention for limb salvage by VVS, however pt is found to have SOB and severe leg edema, concerning for CHF exacerbation. The procedure is canceled and we are asked to treat his CHF exacerbation.  Patient states that he has dry cough, shortness of breath, no chest pain, fever or chills.  No nausea vomiting, diarrhea, abdominal pain, symptoms of UTI or unilateral weakness.  He has a severe bilateral leg edema.    Consultants:   None  Procedures: That is post abdominal aortogram, status post left lower extremity angiography third order catheter placement, [employer] right common femoral by vascular surgery on 06/24/2019  Antimicrobials:   None   Subjective: Pt reports less short of breath and swollen. Reports feeling better. No new complaints. Objective: Vitals:   06/27/19 1645 06/27/19 2007 06/28/19 0502 06/28/19 0720  BP: 123/65 138/68 131/74 (!) 147/71  Pulse: 94 96 78 90  Resp: 18 20 20 17   Temp: 98.4 F (36.9 C) 97.8 F (36.6 C) 97.8 F (36.6 C)   TempSrc:  Oral Oral   SpO2: 95% 96% 98% 97%  Weight:   111.5 kg   Height:        Intake/Output Summary (Last 24 hours) at 06/28/2019 0805 Last data filed at 06/28/2019 0505 Gross per 24 hour  Intake 480 ml  Output 1250 ml  Net -770 ml   Filed Weights   06/26/19 0400 06/27/19 0405 06/28/19 0502  Weight: 108.7 kg 111.9 kg 111.5 kg    Examination:  General exam: Appears calm, none  tachypnic Respiratory system: fine rales b/l scattered only at bases ,  No wheezing. Cardiovascular system: S1 & S2 heard, RRR.  No murmurs, rubs, gallops or clicks.  Gastrointestinal system: Abdomen is nondistended, soft and nontender. Normal bowel sounds heard.Less edema Central nervous system: awake and alert and oriented.  Grossly intact GU: scrotal swelling  decreasing Extremities:right BKA with bilateral skin changes,+ b/l edema Skin: Warm dry Psychiatry:  Mood & affect appropriate for current setting.     Data Reviewed: I have personally reviewed following labs and imaging studies  CBC: Recent Labs  Lab 06/21/19 1453 06/24/19 0459 06/26/19 0412 06/27/19 0428 06/28/19 0548  WBC 8.1 6.4 7.1 6.7 6.6  HGB 9.7* 9.2* 9.0* 9.3* 9.3*  HCT 30.2* 29.9* 29.5* 30.0* 29.4*  MCV 96.8 98.0 101.0* 97.7 98.0  PLT 228 218 197 207 355   Basic Metabolic Panel: Recent Labs  Lab 06/24/19 0459 06/25/19 0534 06/26/19 0412 06/27/19 0428 06/28/19 0548  NA 138 137 133* 134* 135  K 4.4 5.4* 4.6 4.7 4.4  CL 107 104 104 104 104  CO2 25 20* 19* 21* 23  GLUCOSE 98 149* 112* 164* 142*  BUN 55* 71* 80* 88* 75*  CREATININE 1.81* 2.37* 2.88* 2.94* 2.47*  CALCIUM 7.9* 8.3* 7.8* 7.7* 7.9*  MG 2.2  --   --   --   --   PHOS  --   --  5.7*  --   --  GFR: Estimated Creatinine Clearance: 26 mL/min (A) (by C-G formula based on SCr of 2.47 mg/dL (H)). Liver Function Tests: No results for input(s): AST, ALT, ALKPHOS, BILITOT, PROT, ALBUMIN in the last 168 hours. No results for input(s): LIPASE, AMYLASE in the last 168 hours. No results for input(s): AMMONIA in the last 168 hours. Coagulation Profile: No results for input(s): INR, PROTIME in the last 168 hours. Cardiac Enzymes: No results for input(s): CKTOTAL, CKMB, CKMBINDEX, TROPONINI in the last 168 hours. BNP (last 3 results) No results for input(s): PROBNP in the last 8760 hours. HbA1C: Recent Labs    06/27/19 0422  HGBA1C 7.7*    CBG: Recent Labs  Lab 06/27/19 1644 06/27/19 2018 06/28/19 0043 06/28/19 0432 06/28/19 0739  GLUCAP 277* 261* 152* 138* 123*   Lipid Profile: No results for input(s): CHOL, HDL, LDLCALC, TRIG, CHOLHDL, LDLDIRECT in the last 72 hours. Thyroid Function Tests: No results for input(s): TSH, T4TOTAL, FREET4, T3FREE, THYROIDAB in the last 72 hours. Anemia Panel: Recent Labs    06/26/19 0412 06/26/19 1208  VITAMINB12  --  363  FOLATE 8.3  --   FERRITIN 28  --   TIBC 238*  --   IRON 56  --    Sepsis Labs: No results for input(s): PROCALCITON, LATICACIDVEN in the last 168 hours.  Recent Results (from the past 240 hour(s))  SARS CORONAVIRUS 2 (TAT 6-24 HRS) Nasopharyngeal Nasopharyngeal Swab     Status: None   Collection Time: 06/20/19  9:42 AM   Specimen: Nasopharyngeal Swab  Result Value Ref Range Status   SARS Coronavirus 2 NEGATIVE NEGATIVE Final    Comment: (NOTE) SARS-CoV-2 target nucleic acids are NOT DETECTED. The SARS-CoV-2 RNA is generally detectable in upper and lower respiratory specimens during the acute phase of infection. Negative results do not preclude SARS-CoV-2 infection, do not rule out co-infections with other pathogens, and should not be used as the sole basis for treatment or other patient management decisions. Negative results must be combined with clinical observations, patient history, and epidemiological information. The expected result is Negative. Fact Sheet for Patients: SugarRoll.be Fact Sheet for Healthcare Providers: https://www.woods-mathews.com/ This test is not yet approved or cleared by the [place] FDA and  has been authorized for detection and/or diagnosis of SARS-CoV-2 by FDA under an Emergency Use Authorization (EUA). This EUA will remain  in effect (meaning this test can be used) for the duration of the COVID-19 declaration under Section 56 4(b)(1) of the Act, 21 U.S.C. section  360bbb-3(b)(1), unless the authorization is terminated or revoked sooner. Performed at [facility], [place] [address], [place], [place] [address]          Radiology Studies: DG Chest Port 1 View  Result Date: 06/27/2019 CLINICAL DATA:  Congestive heart failure. EXAM: PORTABLE CHEST 1 VIEW COMPARISON:  Chest x-ray dated June 21, 2019. FINDINGS: Unchanged mild cardiomegaly. Unchanged mild diffuse interstitial thickening and trace bilateral pleural effusions. No pneumothorax. No acute osseous abnormality. IMPRESSION: 1. Unchanged mild congestive heart failure. Electronically Signed   By: Titus Dubin M.D.   On: 06/27/2019 15:22        Scheduled Meds: . apixaban  2.5 mg Oral BID  . aspirin EC  81 mg Oral Daily  . bumetanide (BUMEX) IV  0.5 mg Intravenous Daily  . felodipine  7.5 mg Oral Daily  . ferrous sulfate  325 mg Oral Daily  . hydrALAZINE  25 mg Oral TID  . insulin aspart  0-9 Units Subcutaneous  Q4H  . insulin detemir  10 Units Subcutaneous q morning - 10a  . ipratropium-albuterol  3 mL Nebulization Once  . loratadine  10 mg Oral Daily  . metoprolol tartrate  25 mg Oral BID  . multivitamin-lutein  1 capsule Oral Daily  . pravastatin  40 mg Oral q1800  . sodium chloride flush  3 mL Intravenous Q12H  . sodium chloride flush  3 mL Intravenous Q12H  . sodium chloride irrigation  30 mL Irrigation QPM   Continuous Infusions: . sodium chloride    . sodium chloride      Assessment & Plan:   Principal Problem:   Acute on chronic diastolic CHF (congestive heart failure) (HCC) Active Problems:   CKD (chronic kidney disease) stage 4, GFR 15-29 ml/min (HCC)   HLD (hyperlipidemia)   Atrial flutter (HCC)   HTN (hypertension)   Type II diabetes mellitus with renal manifestations (HCC)   Iron deficiency anemia   Atherosclerosis of artery of extremity with ulceration (HCC)   Acute on chronic diastolic CHF (congestive heart failure) [facility]): Patient had 3+ leg edema,  elevated BNP, chest x-ray showed pulmonary edema, consistent with CHF exacerbation..then euvolemic.IV lasix d/c'd On 2/19 received gentle hydration , s/p angiogram..>>>now in acute decompensated HF....>06/28/19 improving slowly Creatinine up even more after lasix given -2d echo-EF 50 to 55%, EF is low normal function.  LV with global hypokinesis.  Grade 1 diastolic dysfunction.  Please see full report -Volume  overloaded is combo of acute /ckD and a/cdhf- spoke to nephrology today about pt's vol. Status, they are in agreement about initiating bumex.  -Daily weights -strict I/O's -Low salt diet -Fluid restriction 1500 Started bumex iv, will increase dose to 1mg  daily  Ck cxr, bnp   Acute on CKD (chronic kidney disease) stage 4, GFR 15-29 ml/min (HCC):  BUN and creatinine going down, today at 2.47, cr 2.88  Nephrology following Likely contrast induced on top of his chronic kidney disease//cardiorenal Baseline creatinine 1.79, GFR of 33 on 12/21/2017 Nephrolgy following, started on  Bumex 0.5 mg IV ...>increase to 1mg  daily today. Monitor urine output and I's and O    HLD (hyperlipidemia): -Pravastatin  Atrial flutter ([place]); -Continue metoprolol and Eliquis  HTN:  -Continue home medications: Felodipine, hydralazine, metoprolol, -hydralazine prn  Type II diabetes mellitus with renal manifestations ([place]): Most recent A1c 7.1, poorly controled. Patient is taking NovoLog and Levemir at home . Levemir dose was discontinued from 14 to 10 units and discontinue pm insulin due to hypoglycemic episode  Continue to monitor blood glucose levels closely -SSI -ck fs  Iron deficiency anemia: Hemoglobin 9.7 (10.1 on 01/25/2018) -Continue iron supplement  Atherosclerosis of artery of extremity with ulceration ([place]): -Status post abdominal aortogram, status post left lower extremity angiography third order catheter placement, [employer] right common femoral.  By Dr. Delana Meyer plan on repeat  angiography with an attempt for revascularization in a few weeks if renal function allows.   DVT prophylaxis: d/c heparin, resume, Eliquis Code Status: Dnr-per daughter who states its on file here Family Communication: Daughter updated Disposition Plan/Barrier: Will likely discharge when volume status and renal function are stabilizing, currently requiring iv bumex as he is very volume overloaded. Plan to d/c to [facility] when stable as stated above   LOS: 7 days   Time spent: 45 minutes with more than 50% COC    Nolberto Hanlon, MD [facility] Pager [phone]  If 7PM-7AM, please contact night-coverage [email or web address] Password [facility] 06/28/2019, 8:05 AM Patient ID:  Miguel Torres, male   DOB: Jul 03, 1930, 86 y.o.   MRN: [id number]

## 2019-06-28 NOTE — Progress Notes (Signed)
Patient refused 2 units of insulin. FSBS 152. States that the ordered dose 'wont make a difference". Reeducated on importance of medication regimen. Will continue to monitor.

## 2019-06-28 NOTE — NC FL2 (Signed)
[place] LEVEL OF CARE SCREENING TOOL     IDENTIFICATION  Patient Name: Miguel Torres Birthdate: [birth year] Sex: male Admission Date (Current Location): 06/21/2019  [place] and [place] Number:  Engineering geologist and Address:  [facility], [address] [place], [place] [address]      Provider Number: [id number]  Attending Physician Name and Address:  Nolberto Hanlon, MD  Relative Name and Phone Number:       Current Level of Care: Hospital Recommended Level of Care: [place] Prior Approval Number:    Date Approved/Denied:   PASRR Number:    Discharge Plan: SNF    Current Diagnoses: Patient Active Problem List   Diagnosis Date Noted  . Acute on chronic diastolic CHF (congestive heart failure) ([place]) 06/21/2019  . HTN (hypertension) 06/21/2019  . Type II diabetes mellitus with renal manifestations ([place]) 06/21/2019  . Iron deficiency anemia 06/21/2019  . Atherosclerosis of artery of extremity with ulceration ([place])   . Venous ulcer of left lower extremity without varicose veins ([place]) 06/13/2019  . S/P BKA (below knee amputation) unilateral, right ([place]) 05/18/2019  . Atherosclerosis of native arteries of the extremities with ulceration ([place]) 03/14/2019  . Resides in skilled nursing facility 02/24/2018  . Benign prostatic hyperplasia with urinary obstruction 12/23/2017  . Urinary retention 12/18/2017  . Acute diastolic (congestive) heart failure ([place]) 12/17/2017  . Goals of care, counseling/discussion   . Palliative care encounter   . Congestive heart failure (CHF) ([place]) 12/16/2017  . Acute diastolic CHF (congestive heart failure) ([place]) 12/16/2017  . Acute kidney injury ([place]) 12/07/2017  . Foot ulceration, left, with fat layer exposed ([place]) 11/23/2017  . Lymphedema 11/23/2017  . Shoulder pain, right 11/04/2017  . Pressure injury of skin 11/02/2017  . Elevated troponin 11/01/2017  . Microalbuminuria 10/15/2016   . BCC (basal cell carcinoma), face 06/24/2016  . Vitamin D deficiency 07/12/2015  . Retinopathy, diabetic, proliferative ([place]) 07/02/2015  . Bleeding duodenal ulcer 02/02/2015  . Atrial flutter ([place]) 01/19/2015  . Allergic rhinitis 01/17/2015  . Anemia of chronic disease 01/17/2015  . CKD (chronic kidney disease) stage 4, GFR 15-29 ml/min (HCC) 01/17/2015  . Diabetes mellitus with nephropathy ([place]) 01/17/2015  . Edema 01/17/2015  . Gout 01/17/2015  . Amputation of right lower extremity below knee upon examination ([place]) 01/17/2015  . HLD (hyperlipidemia) 01/17/2015  . Psoriasis 01/17/2015  . CA of skin 01/17/2015  . Acquired absence of right leg below knee ([place]) 01/17/2015  . Hypertension 10/27/2014  . Mild cognitive disorder 12/05/2013  . Postop check 02/10/2013  . Cancer of skin, squamous cell 01/21/2013  . Squamous cell carcinoma of scalp and skin of neck 03/03/2011    Orientation RESPIRATION BLADDER Height & Weight     Self, Time, Situation, Place  Normal Continent Weight: 245 lb 12.8 oz (111.5 kg) Height:  5\' 8"  (172.7 cm)  BEHAVIORAL SYMPTOMS/MOOD NEUROLOGICAL BOWEL NUTRITION STATUS      Continent Diet(renal with fluid restriction Fluid restriction: 1500 mL)  AMBULATORY STATUS COMMUNICATION OF NEEDS Skin   Limited Assist Verbally Normal                       Personal Care Assistance Level of Assistance  Bathing, Feeding, Dressing Bathing Assistance: Limited assistance Feeding assistance: Independent Dressing Assistance: Limited assistance     Functional Limitations Info  Sight, Speech, Hearing Sight Info: Adequate Hearing Info: Adequate Speech Info: Adequate    SPECIAL CARE FACTORS  FREQUENCY  PT (By licensed PT), OT (By licensed OT)     PT Frequency: 5x OT Frequency: 5x            Contractures Contractures Info: Not present    Additional Factors Info  Code Status, Allergies Code Status Info: DNR Allergies Info: NO known alleriges            Current Medications (06/28/2019):  This is the current hospital active medication list Current Facility-Administered Medications  Medication Dose Route Frequency Provider Last Rate Last Admin  . 0.9 %  sodium chloride infusion  250 mL Intravenous PRN Schnier, Dolores Lory, MD      . 0.9 %  sodium chloride infusion  250 mL Intravenous PRN Schnier, Dolores Lory, MD      . acetaminophen (TYLENOL) tablet 650 mg  650 mg Oral Q4H PRN Schnier, Dolores Lory, MD   650 mg at 06/28/19 1039  . apixaban (ELIQUIS) tablet 2.5 mg  2.5 mg Oral BID Nolberto Hanlon, MD   2.5 mg at 06/28/19 0904  . aspirin EC tablet 81 mg  81 mg Oral Daily Schnier, Dolores Lory, MD   81 mg at 06/28/19 5188  . bumetanide (BUMEX) injection 0.5 mg  0.5 mg Intravenous Daily Kolluru, Sarath, MD   0.5 mg at 06/28/19 0905  . felodipine (PLENDIL) 24 hr tablet 7.5 mg  7.5 mg Oral Daily Schnier, Dolores Lory, MD   7.5 mg at 06/28/19 1040  . ferrous sulfate tablet 325 mg  325 mg Oral Daily Schnier, Dolores Lory, MD   325 mg at 06/28/19 4166  . hydrALAZINE (APRESOLINE) tablet 25 mg  25 mg Oral TID Katha Cabal, MD   25 mg at 06/28/19 0904  . hydrALAZINE (APRESOLINE) tablet 25 mg  25 mg Oral TID PRN Schnier, Dolores Lory, MD      . insulin aspart (novoLOG) injection 0-9 Units  0-9 Units Subcutaneous Q4H Schnier, Dolores Lory, MD   2 Units at 06/28/19 1225  . insulin detemir (LEVEMIR) injection 10 Units  10 Units Subcutaneous q morning - 10a Schnier, Dolores Lory, MD   10 Units at 06/28/19 1039  . ipratropium-albuterol (DUONEB) 0.5-2.5 (3) MG/3ML nebulizer solution 3 mL  3 mL Nebulization Once Schnier, Dolores Lory, MD      . loratadine (CLARITIN) tablet 10 mg  10 mg Oral Daily Schnier, Dolores Lory, MD   10 mg at 06/28/19 0904  . metoprolol tartrate (LOPRESSOR) tablet 25 mg  25 mg Oral BID Delana Meyer Dolores Lory, MD   25 mg at 06/28/19 0904  . morphine 4 MG/ML injection 1 mg  1 mg Intravenous Q3H PRN Schnier, Dolores Lory, MD      . multivitamin-lutein (OCUVITE-LUTEIN) capsule 1  capsule  1 capsule Oral Daily Schnier, Dolores Lory, MD   1 capsule at 06/28/19 0905  . ondansetron (ZOFRAN) injection 4 mg  4 mg Intravenous Q6H PRN Schnier, Dolores Lory, MD      . oxyCODONE (Oxy IR/ROXICODONE) immediate release tablet 5-10 mg  5-10 mg Oral Q4H PRN Schnier, Dolores Lory, MD      . pravastatin (PRAVACHOL) tablet 40 mg  40 mg Oral q1800 Schnier, Dolores Lory, MD   40 mg at 06/27/19 1726  . sodium chloride flush (NS) 0.9 % injection 3 mL  3 mL Intravenous Q12H Schnier, Dolores Lory, MD   3 mL at 06/28/19 0908  . sodium chloride flush (NS) 0.9 % injection 3 mL  3 mL Intravenous PRN Schnier, Dolores Lory, MD      .  sodium chloride flush (NS) 0.9 % injection 3 mL  3 mL Intravenous Q12H Schnier, Dolores Lory, MD   3 mL at 06/28/19 0906  . sodium chloride flush (NS) 0.9 % injection 3 mL  3 mL Intravenous PRN Schnier, Dolores Lory, MD      . sodium chloride irrigation 0.9 % 30 mL  30 mL Irrigation QPM Schnier, Dolores Lory, MD   30 mL at 06/27/19 1727     Discharge Medications: Please see discharge summary for a list of discharge medications.  Relevant Imaging Results:  Relevant Lab Results:   Additional Information [id number]  Gerrianne Scale Teran Daughenbaugh, LCSW

## 2019-06-28 NOTE — Progress Notes (Signed)
[facility] Daily Progress Note   Subjective: 4 Days Post-Op: 1. Abdominal aortogram 2. Left lower extremity angiography third order catheter placement 3. Star close right common femoral  Resting comfortably this AM.   Objective: Vitals:   06/27/19 2007 06/28/19 0502 06/28/19 0720 06/28/19 1110  BP: 138/68 131/74 (!) 147/71   Pulse: 96 78 90 (!) 110  Resp: 20 20 17 18   Temp: 97.8 F (36.6 C) 97.8 F (36.6 C)  (!) 97.5 F (36.4 C)  TempSrc: Oral Oral  Oral  SpO2: 96% 98% 97% 94%  Weight:  111.5 kg    Height:        Intake/Output Summary (Last 24 hours) at 06/28/2019 1129 Last data filed at 06/28/2019 0930 Gross per 24 hour  Intake 480 ml  Output 1250 ml  Net -770 ml   Physical Exam: A&Ox3, NAD CV: RRR Pulmonary: CTA Bilaterally Abdomen: Soft, Nontender, Nondistended Right Groin:             Access Site: clean, dry and intact Vascular:             Left Lower Extremity: Thigh soft. Calf soft. Extremity is warm distally however hard to palpate pedal pulses. Motor / sensory intact.   Laboratory: CBC    Component Value Date/Time   WBC 6.6 06/28/2019 0548   HGB 9.3 (L) 06/28/2019 0548   HGB 11.0 (L) 10/15/2016 1611   HCT 29.4 (L) 06/28/2019 0548   HCT 34.5 (L) 10/15/2016 1611   PLT 193 06/28/2019 0548   PLT 183 10/15/2016 1611   BMET    Component Value Date/Time   NA 135 06/28/2019 0548   NA 138 10/15/2016 1611   NA 140 09/30/2012 1609   K 4.4 06/28/2019 0548   K 4.5 09/30/2012 1609   CL 104 06/28/2019 0548   CL 111 (H) 09/30/2012 1609   CO2 23 06/28/2019 0548   CO2 24 09/30/2012 1609   GLUCOSE 142 (H) 06/28/2019 0548   GLUCOSE 90 09/30/2012 1609   BUN 75 (H) 06/28/2019 0548   BUN 30 (H) 10/15/2016 1611   BUN 28 (H) 09/30/2012 1609   CREATININE 2.47 (H) 06/28/2019 0548   CREATININE 1.33 (H) 09/30/2012 1609   CALCIUM 7.9 (L) 06/28/2019 0548   CALCIUM 8.5 09/30/2012 1609   GFRNONAA 23 (L)  06/28/2019 0548   GFRNONAA 50 (L) 09/30/2012 1609   GFRAA 26 (L) 06/28/2019 0548   GFRAA 58 (L) 09/30/2012 1609   Assessment/Planning: The patient is a 86 year old male with known hx of PAD who was originally scheduled for left lower extremity angiogram last week however was canceled due to CHF exacerbation, now POD#4 left lower extremity angiogram  1) On ASA and Eliquis. Plan to possible repeat angiogram in future however will be dependent on renal function. No further vascular intervention planned at this time during this  2) Creatinine trending down today (2.47) - nephrology is following  Discussed with Dr. Eber Hong [facility] PA-C 06/28/2019 11:29 AM

## 2019-06-29 LAB — BASIC METABOLIC PANEL
Anion gap: 9 (ref 5–15)
BUN: 74 mg/dL — ABNORMAL HIGH (ref 8–23)
CO2: 22 mmol/L (ref 22–32)
Calcium: 8 mg/dL — ABNORMAL LOW (ref 8.9–10.3)
Chloride: 105 mmol/L (ref 98–111)
Creatinine, Ser: 2.36 mg/dL — ABNORMAL HIGH (ref 0.61–1.24)
GFR calc Af Amer: 28 mL/min — ABNORMAL LOW (ref >60)
GFR calc non Af Amer: 24 mL/min — ABNORMAL LOW (ref >60)
Glucose, Bld: 104 mg/dL — ABNORMAL HIGH (ref 70–99)
Potassium: 4.3 mmol/L (ref 3.5–5.1)
Sodium: 136 mmol/L (ref 135–145)

## 2019-06-29 LAB — GLUCOSE, CAPILLARY
Glucose-Capillary: 123 mg/dL — ABNORMAL HIGH (ref 70–99)
Glucose-Capillary: 163 mg/dL — ABNORMAL HIGH (ref 70–99)
Glucose-Capillary: 184 mg/dL — ABNORMAL HIGH (ref 70–99)
Glucose-Capillary: 189 mg/dL — ABNORMAL HIGH (ref 70–99)
Glucose-Capillary: 90 mg/dL (ref 70–99)
Glucose-Capillary: 90 mg/dL (ref 70–99)

## 2019-06-29 LAB — MAGNESIUM: Magnesium: 2.6 mg/dL — ABNORMAL HIGH (ref 1.7–2.4)

## 2019-06-29 NOTE — Progress Notes (Signed)
PROGRESS NOTE    Miguel Torres  [name]:[id number] DOB: [birth year] DOA: 06/21/2019 PCP: [name], MD    Brief Narrative:  Miguel Tortorella Gerringeris a 86 y.o.malewith medical history significant ofhypertension, hyperlipidemia, diabetes mellitus,dCHF, iron deficiency anemia, CKD-4,s/p ofright BKA, atrial flutter on Eliquis, who presents with shortness breath, leg edema.  Pt has hx of atherosclerosis of native arteries of the extremities with ulceration. He is scheduled forangiography of theleftlower extremitywith the hope for intervention for limb salvageby VVS,howeverpt is found to have SOB andsevereleg edema, concerning for CHF exacerbation. The procedure is canceled and we are asked to treat his CHF exacerbation. Patient states that he has dry cough, shortness of breath, no chest pain, fever or chills. No nausea vomiting, diarrhea, abdominal pain, symptoms of UTI or unilateral weakness. He has a severe bilateral leg edema.  06/29/2019: Patient seen and examined.  Clinically feels shortness of breath is improving.  Discussed with nephrology consultants.  Volume status is overall improving.  Urine output adequate over the past 24 hours.  Diuretic dose decreased to Bumex 0.5 mg once daily   Assessment & Plan:   Principal Problem:   Acute on chronic diastolic CHF (congestive heart failure) (HCC) Active Problems:   CKD (chronic kidney disease) stage 4, GFR 15-29 ml/min (HCC)   HLD (hyperlipidemia)   Atrial flutter (HCC)   HTN (hypertension)   Type II diabetes mellitus with renal manifestations (HCC)   Iron deficiency anemia   Atherosclerosis of artery of extremity with ulceration (HCC)  Acute on chronic diastolic CHF (congestive heart failure) [facility]): Patient had 3+ leg edema, elevated BNP, chest x-ray showed pulmonary edema, consistent with CHF exacerbation Patient had exposure and subsequent administration of fluids Precipitated acute decompensated heart  failure Worsening kidney function after Lasix administration -2d echo-EF 50 to 55%, EF is low normal function.  LV with global hypokinesis.  Grade 1 diastolic dysfunction.  Plan: Bumex IV, dose decreased 2.5 mg daily -Daily weights -strict I/O's -Low salt diet -Fluid restriction 1500   Acute on CKD (chronic kidney disease) stage 4, GFR 15-29 ml/min [facility]):  Slowly improving over interval Nephrology following Likely contrast induced on top of his chronic kidney disease//cardiorenal Baseline creatinine 1.79, GFR of 33 on 12/21/2017 Plan: Nephrolgy following, dose decreased 0.5 mg IV daily Monitor urine output and I's and O Daily renal function   HLD (hyperlipidemia): -Pravastatin  Atrial flutter (); -Continue metoprolol and Eliquis  HTN:  -Continue home medications:Felodipine, hydralazine, metoprolol, -hydralazine prn  Type II diabetes mellitus with renal manifestations ([place]): Most recent A1c7.1 Patient is takingNovoLog and Levemirat home Plan: Levemir 10 units daily SSI Carb controlled diet  Iron deficiency anemia:Hemoglobin 9.7 (10.1 on 01/25/2018) -Continue iron supplement  Atherosclerosis of artery of extremity with ulceration [facility]): Vascular following peripherally Tentative plans for repeat angiography with potential revascularization in a few weeks if renal function allows Dr. Delana Meyer familiar with patient   DVT prophylaxis: Eliquis Code Status: DNR Family Communication: [name] via phone, [phone], 06/29/2019 Disposition Plan: DC back to [facility] when euvolemia achieved.  Anticipate 24 to 48 hours.  Discussed with case management.  No anticipated barriers to discharge  Consultants:   Nephrology-[place] [facility]  Procedures:   None  Antimicrobials:   None   Subjective: Patient seen and examined No acute status changes overnight No new complaints  Objective: Vitals:   06/28/19 1927 06/29/19  0515 06/29/19 0518 06/29/19 0717  BP: 135/62 135/72  (!) 141/68  Pulse: 77 (!) 38 88 88  Resp: 20 20  17   Temp: 97.8 F (36.6 C) (!) 97.5 F (36.4 C)  97.6 F (36.4 C)  TempSrc: Oral Oral  Oral  SpO2: 96% 95%  97%  Weight:  63.6 kg    Height:        Intake/Output Summary (Last 24 hours) at 06/29/2019 1135 Last data filed at 06/29/2019 1001 Gross per 24 hour  Intake 720 ml  Output 400 ml  Net 320 ml   Filed Weights   06/27/19 0405 06/28/19 0502 06/29/19 0515  Weight: 111.9 kg 111.5 kg 63.6 kg    Examination:  General exam: Appears calm and comfortable  Respiratory system: Bibasilar crackles, no wheeze, normal respiratory effort Cardiovascular system: 2+ pedal edema bilaterally.  No murmurs.  S1-S2 heard gastrointestinal system: Abdomen is nondistended, soft and nontender. No organomegaly or masses felt. Normal bowel sounds heard. GU: Improved scrotal swelling Central nervous system: Alert and oriented. No focal neurological deficits. Extremities: Symmetric 5 x 5 power. Skin: No rashes, lesions or ulcers Psychiatry: Judgement and insight appear normal. Mood & affect appropriate.     Data Reviewed: I have personally reviewed following labs and imaging studies  CBC: Recent Labs  Lab 06/24/19 0459 06/26/19 0412 06/27/19 0428 06/28/19 0548  WBC 6.4 7.1 6.7 6.6  HGB 9.2* 9.0* 9.3* 9.3*  HCT 29.9* 29.5* 30.0* 29.4*  MCV 98.0 101.0* 97.7 98.0  PLT 218 197 207 025   Basic Metabolic Panel: Recent Labs  Lab 06/24/19 0459 06/24/19 0459 06/25/19 0534 06/26/19 0412 06/27/19 0428 06/28/19 0548 06/29/19 0627  NA 138   < > 137 133* 134* 135 136  K 4.4   < > 5.4* 4.6 4.7 4.4 4.3  CL 107   < > 104 104 104 104 105  CO2 25   < > 20* 19* 21* 23 22  GLUCOSE 98   < > 149* 112* 164* 142* 104*  BUN 55*   < > 71* 80* 88* 75* 74*  CREATININE 1.81*   < > 2.37* 2.88* 2.94* 2.47* 2.36*  CALCIUM 7.9*   < > 8.3* 7.8* 7.7* 7.9* 8.0*  MG 2.2  --   --   --   --   --  2.6*  PHOS  --    --   --  5.7*  --   --   --    < > = values in this interval not displayed.   GFR: Estimated Creatinine Clearance: 20.2 mL/min (A) (by C-G formula based on SCr of 2.36 mg/dL (H)). Liver Function Tests: No results for input(s): AST, ALT, ALKPHOS, BILITOT, PROT, ALBUMIN in the last 168 hours. No results for input(s): LIPASE, AMYLASE in the last 168 hours. No results for input(s): AMMONIA in the last 168 hours. Coagulation Profile: No results for input(s): INR, PROTIME in the last 168 hours. Cardiac Enzymes: No results for input(s): CKTOTAL, CKMB, CKMBINDEX, TROPONINI in the last 168 hours. BNP (last 3 results) No results for input(s): PROBNP in the last 8760 hours. HbA1C: Recent Labs    06/27/19 0422  HGBA1C 7.7*   CBG: Recent Labs  Lab 06/28/19 1557 06/28/19 1929 06/29/19 0020 06/29/19 0435 06/29/19 0719  GLUCAP 173* 190* 123* 90 90   Lipid Profile: No results for input(s): CHOL, HDL, LDLCALC, TRIG, CHOLHDL, LDLDIRECT in the last 72 hours. Thyroid Function Tests: No results for input(s): TSH, T4TOTAL, FREET4, T3FREE, THYROIDAB in the last 72 hours. Anemia Panel: Recent Labs    06/26/19 [place]  Sepsis Labs: No results for input(s): PROCALCITON, LATICACIDVEN in the last 168 hours.  Recent Results (from the past 240 hour(s))  SARS CORONAVIRUS 2 (TAT 6-24 HRS) Nasopharyngeal Nasopharyngeal Swab     Status: None   Collection Time: 06/20/19  9:42 AM   Specimen: Nasopharyngeal Swab  Result Value Ref Range Status   SARS Coronavirus 2 NEGATIVE NEGATIVE Final    Comment: (NOTE) SARS-CoV-2 target nucleic acids are NOT DETECTED. The SARS-CoV-2 RNA is generally detectable in upper and lower respiratory specimens during the acute phase of infection. Negative results do not preclude SARS-CoV-2 infection, do not rule out co-infections with other pathogens, and should not be used as the sole basis for treatment or other patient management decisions. Negative  results must be combined with clinical observations, patient history, and epidemiological information. The expected result is Negative. Fact Sheet for Patients: SugarRoll.be Fact Sheet for Healthcare Providers: https://www.woods-mathews.com/ This test is not yet approved or cleared by the [place] FDA and  has been authorized for detection and/or diagnosis of SARS-CoV-2 by FDA under an Emergency Use Authorization (EUA). This EUA will remain  in effect (meaning this test can be used) for the duration of the COVID-19 declaration under Section 56 4(b)(1) of the Act, 21 U.S.C. section 360bbb-3(b)(1), unless the authorization is terminated or revoked sooner. Performed at [facility], [place] [address] [place], [place] [address]          Radiology Studies: DG Chest Port 1 View  Result Date: 06/27/2019 CLINICAL DATA:  Congestive heart failure. EXAM: PORTABLE CHEST 1 VIEW COMPARISON:  Chest x-ray dated June 21, 2019. FINDINGS: Unchanged mild cardiomegaly. Unchanged mild diffuse interstitial thickening and trace bilateral pleural effusions. No pneumothorax. No acute osseous abnormality. IMPRESSION: 1. Unchanged mild congestive heart failure. Electronically Signed   By: Titus Dubin M.D.   On: 06/27/2019 15:22        Scheduled Meds: . apixaban  2.5 mg Oral BID  . aspirin EC  81 mg Oral Daily  . bumetanide (BUMEX) IV  0.5 mg Intravenous Daily  . felodipine  7.5 mg Oral Daily  . ferrous sulfate  325 mg Oral Daily  . hydrALAZINE  25 mg Oral TID  . insulin aspart  0-9 Units Subcutaneous Q4H  . insulin detemir  10 Units Subcutaneous q morning - 10a  . ipratropium-albuterol  3 mL Nebulization Once  . loratadine  10 mg Oral Daily  . metoprolol tartrate  25 mg Oral BID  . multivitamin-lutein  1 capsule Oral Daily  . pravastatin  40 mg Oral q1800  . sodium chloride flush  3 mL Intravenous Q12H  . sodium chloride flush  3 mL  Intravenous Q12H  . sodium chloride irrigation  30 mL Irrigation QPM   Continuous Infusions: . sodium chloride    . sodium chloride       LOS: 8 days    Time spent: 35 minutes    Sidney Ace, MD [facility] Pager [phone]  If 7PM-7AM, please contact night-coverage  06/29/2019, 11:35 AM

## 2019-06-29 NOTE — Progress Notes (Signed)
[facility]  ROUNDING NOTE   Subjective:   Still has orthopnea.   UOP 1 liter Bumex 1 mg daily   Creatinine 2.36 (2.47) ( 2.94) (2.88) (2.37)  Objective:  Vital signs in last 24 hours:  Temp:  [97.5 F (36.4 C)-97.8 F (36.6 C)] 97.6 F (36.4 C) (02/24 0717) Pulse Rate:  [38-110] 88 (02/24 0717) Resp:  [17-20] 17 (02/24 0717) BP: (119-141)/(44-72) 141/68 (02/24 0717) SpO2:  [94 %-100 %] 97 % (02/24 0717) Weight:  [63.6 kg] 63.6 kg (02/24 0515)  Weight change: -47.9 kg Filed Weights   06/27/19 0405 06/28/19 0502 06/29/19 0515  Weight: 111.9 kg 111.5 kg 63.6 kg    Intake/Output: I/O last 3 completed shifts: In: 720 [P.O.:720] Out: 2250 [Urine:2250]   Intake/Output this shift:  No intake/output data recorded.  Physical Exam: General: NAD, laying in bed  Head: Normocephalic, atraumatic. Moist oral mucosal membranes  Eyes: Anicteric, PERRL  Neck: Supple, trachea midline  Lungs:  Crackles at bases  Heart: irregular  Abdomen:  Soft, nontender, obese  Extremities:  + peripheral edema. Right BKA  Neurologic: Nonfocal, moving all four extremities  Skin: No lesions  GU: +scrotal edema    Basic Metabolic Panel: Recent Labs  Lab 06/24/19 0459 06/24/19 0459 06/25/19 0534 06/25/19 0534 06/26/19 0412 06/26/19 0412 06/27/19 0428 06/28/19 0548 06/29/19 0627  NA 138   < > 137  --  133*  --  134* 135 136  K 4.4   < > 5.4*  --  4.6  --  4.7 4.4 4.3  CL 107   < > 104  --  104  --  104 104 105  CO2 25   < > 20*  --  19*  --  21* 23 22  GLUCOSE 98   < > 149*  --  112*  --  164* 142* 104*  BUN 55*   < > 71*  --  80*  --  88* 75* 74*  CREATININE 1.81*   < > 2.37*  --  2.88*  --  2.94* 2.47* 2.36*  CALCIUM 7.9*   < > 8.3*   < > 7.8*   < > 7.7* 7.9* 8.0*  MG 2.2  --   --   --   --   --   --   --  2.6*  PHOS  --   --   --   --  5.7*  --   --   --   --    < > = values in this interval not displayed.    Liver Function Tests: No results for input(s): AST,  ALT, ALKPHOS, BILITOT, PROT, ALBUMIN in the last 168 hours. No results for input(s): LIPASE, AMYLASE in the last 168 hours. No results for input(s): AMMONIA in the last 168 hours.  CBC: Recent Labs  Lab 06/24/19 0459 06/26/19 0412 06/27/19 0428 06/28/19 0548  WBC 6.4 7.1 6.7 6.6  HGB 9.2* 9.0* 9.3* 9.3*  HCT 29.9* 29.5* 30.0* 29.4*  MCV 98.0 101.0* 97.7 98.0  PLT 218 197 207 193    Cardiac Enzymes: No results for input(s): CKTOTAL, CKMB, CKMBINDEX, TROPONINI in the last 168 hours.  BNP: Invalid input(s): POCBNP  CBG: Recent Labs  Lab 06/28/19 1557 06/28/19 1929 06/29/19 0020 06/29/19 0435 06/29/19 0719  GLUCAP 173* 190* 123* 90 90    Microbiology: Results for orders placed or performed during the hospital encounter of 06/20/19  SARS CORONAVIRUS 2 (TAT 6-24 HRS) Nasopharyngeal Nasopharyngeal Swab  Status: None   Collection Time: 06/20/19  9:42 AM   Specimen: Nasopharyngeal Swab  Result Value Ref Range Status   SARS Coronavirus 2 NEGATIVE NEGATIVE Final    Comment: (NOTE) SARS-CoV-2 target nucleic acids are NOT DETECTED. The SARS-CoV-2 RNA is generally detectable in upper and lower respiratory specimens during the acute phase of infection. Negative results do not preclude SARS-CoV-2 infection, do not rule out co-infections with other pathogens, and should not be used as the sole basis for treatment or other patient management decisions. Negative results must be combined with clinical observations, patient history, and epidemiological information. The expected result is Negative. Fact Sheet for Patients: SugarRoll.be Fact Sheet for Healthcare Providers: https://www.woods-mathews.com/ This test is not yet approved or cleared by the [place] FDA and  has been authorized for detection and/or diagnosis of SARS-CoV-2 by FDA under an Emergency Use Authorization (EUA). This EUA will remain  in effect (meaning this test  can be used) for the duration of the COVID-19 declaration under Section 56 4(b)(1) of the Act, 21 U.S.C. section 360bbb-3(b)(1), unless the authorization is terminated or revoked sooner. Performed at [facility], [address], [place], [place] [address]     Coagulation Studies: No results for input(s): LABPROT, INR in the last 72 hours.  Urinalysis: No results for input(s): COLORURINE, LABSPEC, PHURINE, GLUCOSEU, HGBUR, BILIRUBINUR, KETONESUR, PROTEINUR, UROBILINOGEN, NITRITE, LEUKOCYTESUR in the last 72 hours.  Invalid input(s): APPERANCEUR    Imaging: DG Chest Port 1 View  Result Date: 06/27/2019 CLINICAL DATA:  Congestive heart failure. EXAM: PORTABLE CHEST 1 VIEW COMPARISON:  Chest x-ray dated June 21, 2019. FINDINGS: Unchanged mild cardiomegaly. Unchanged mild diffuse interstitial thickening and trace bilateral pleural effusions. No pneumothorax. No acute osseous abnormality. IMPRESSION: 1. Unchanged mild congestive heart failure. Electronically Signed   By: Titus Dubin M.D.   On: 06/27/2019 15:22     Medications:   . sodium chloride    . sodium chloride     . apixaban  2.5 mg Oral BID  . aspirin EC  81 mg Oral Daily  . bumetanide (BUMEX) IV  0.5 mg Intravenous Daily  . felodipine  7.5 mg Oral Daily  . ferrous sulfate  325 mg Oral Daily  . hydrALAZINE  25 mg Oral TID  . insulin aspart  0-9 Units Subcutaneous Q4H  . insulin detemir  10 Units Subcutaneous q morning - 10a  . ipratropium-albuterol  3 mL Nebulization Once  . loratadine  10 mg Oral Daily  . metoprolol tartrate  25 mg Oral BID  . multivitamin-lutein  1 capsule Oral Daily  . pravastatin  40 mg Oral q1800  . sodium chloride flush  3 mL Intravenous Q12H  . sodium chloride flush  3 mL Intravenous Q12H  . sodium chloride irrigation  30 mL Irrigation QPM   sodium chloride, sodium chloride, acetaminophen, hydrALAZINE, morphine injection, ondansetron (ZOFRAN) IV, oxyCODONE, sodium chloride flush,  sodium chloride flush  Assessment/ Plan:  [name] is a 86 y.o. white male with insulin dependent diabetes, hypertension, COPD, peripheral vascular disease, congestive heart failure, hyperlipidemia, anemia, right below the knee amputation, CVA, gout, atrial fibrillation on eliquis, who was admitted to [facility] on 06/21/2019 for Acute on chronic diastolic CHF (congestive heart failure) ([place]) [I50.33]   Underwent left lower extremity angiogram on 2/19.   1. Acute renal failure: with hyperkalemia on chronic kidney disease stage IIIB with proteinuria. Baseline creatinine of 1.79, GFR of 33 on 12/21/2017.  Chronic kidney disease secondary  to diabetic nephropathy Acute renal failure secondary to contrast exposure and underlying cardio-renal syndrome.   2. Hypertension and acute exacerbation of diastolic congestive heart failure: then given IV fluids due to angiogram on 2/19. Now with volume overload with significant pulmonary, scrotal and pedal edema on examination.  Continue felodipine, metoprolol, hydralazine - bumetanide to 0.5 mg IV daily.  - Monitor volume status  3. Diabetes mellitus type II with chronic kidney disease: insulin dependent. Hemoglobin A1c of 7.7%   4. Anemia with chronic kidney disease: hemoglobin 9.3.  iron studies, vitamin M07 and folic acid at goal.    5. Hyponatremia: hypervolemic secondary to volume overload from acute exacerbation of diastolic congestive heart failure.  - improving with loop diuretics.    LOS: 8 Miguel Torres 2/24/20219:41 AM

## 2019-06-30 ENCOUNTER — Ambulatory Visit: Payer: Medicare Other | Admitting: Family

## 2019-06-30 LAB — SARS CORONAVIRUS 2 (TAT 6-24 HRS): SARS Coronavirus 2: NEGATIVE

## 2019-06-30 LAB — GLUCOSE, CAPILLARY
Glucose-Capillary: 120 mg/dL — ABNORMAL HIGH (ref 70–99)
Glucose-Capillary: 118 mg/dL — ABNORMAL HIGH (ref 70–99)
Glucose-Capillary: 103 mg/dL — ABNORMAL HIGH (ref 70–99)
Glucose-Capillary: 164 mg/dL — ABNORMAL HIGH (ref 70–99)
Glucose-Capillary: 255 mg/dL — ABNORMAL HIGH (ref 70–99)
Glucose-Capillary: 186 mg/dL — ABNORMAL HIGH (ref 70–99)
Glucose-Capillary: 134 mg/dL — ABNORMAL HIGH (ref 70–99)

## 2019-06-30 LAB — CBC
HCT: 30.5 % — ABNORMAL LOW (ref 39.0–52.0)
Hemoglobin: 9.5 g/dL — ABNORMAL LOW (ref 13.0–17.0)
MCH: 30.9 pg (ref 26.0–34.0)
MCHC: 31.1 g/dL (ref 30.0–36.0)
MCV: 99.3 fL (ref 80.0–100.0)
Platelets: 194 10*3/uL (ref 150–400)
RBC: 3.07 MIL/uL — ABNORMAL LOW (ref 4.22–5.81)
RDW: 14.3 % (ref 11.5–15.5)
WBC: 6.8 10*3/uL (ref 4.0–10.5)
nRBC: 0 % (ref 0.0–0.2)

## 2019-06-30 LAB — BASIC METABOLIC PANEL
Anion gap: 9 (ref 5–15)
BUN: 71 mg/dL — ABNORMAL HIGH (ref 8–23)
CO2: 23 mmol/L (ref 22–32)
Calcium: 8 mg/dL — ABNORMAL LOW (ref 8.9–10.3)
Chloride: 106 mmol/L (ref 98–111)
Creatinine, Ser: 2.16 mg/dL — ABNORMAL HIGH (ref 0.61–1.24)
GFR calc Af Amer: 31 mL/min — ABNORMAL LOW (ref >60)
GFR calc non Af Amer: 27 mL/min — ABNORMAL LOW (ref >60)
Glucose, Bld: 126 mg/dL — ABNORMAL HIGH (ref 70–99)
Potassium: 4.5 mmol/L (ref 3.5–5.1)
Sodium: 138 mmol/L (ref 135–145)

## 2019-06-30 LAB — PROTEIN ELECTRO, RANDOM URINE
Albumin ELP, Urine: UNDETERMINED %
Alpha-1-Globulin, U: UNDETERMINED %
Alpha-2-Globulin, U: UNDETERMINED %
Beta Globulin, U: UNDETERMINED %
Gamma Globulin, U: UNDETERMINED %
M Component, Ur: UNDETERMINED %
Total Protein, Urine: 662.1 mg/dL

## 2019-06-30 LAB — MAGNESIUM: Magnesium: 2.4 mg/dL (ref 1.7–2.4)

## 2019-06-30 MED ORDER — ADULT MULTIVITAMIN W/MINERALS CH
1.0000 | ORAL_TABLET | Freq: Every day | ORAL | Status: DC
  Administered 2019-07-01: 1 via ORAL
  Filled 2019-06-30: qty 1

## 2019-06-30 MED ORDER — POLYETHYLENE GLYCOL 3350 17 G PO PACK
17.0000 g | PACK | Freq: Every day | ORAL | Status: DC
  Administered 2019-06-30 – 2019-07-01 (×2): 17 g via ORAL
  Filled 2019-06-30 (×2): qty 1

## 2019-06-30 MED ORDER — NEPRO/CARBSTEADY PO LIQD: 237.0000 mL | ORAL | Status: DC

## 2019-06-30 NOTE — Care Management Important Message (Signed)
Important Message  Patient Details  Name: Miguel Torres MRN: [id number] Date of Birth: [birth year]   Medicare Important Message Given:  Yes     Dannette Barbara 06/30/2019, 12:51 PM

## 2019-06-30 NOTE — Evaluation (Signed)
Physical Therapy Evaluation Patient Details Name: Miguel Torres MRN: [id number] DOB: [birth year] Today's Date: 06/30/2019   History of Present Illness  Pt is an 86 y.o. male with medical history significant of hypertension, hyperlipidemia, diabetes mellitus, dCHF, iron deficiency anemia, CKD-4, s/p right BKA, atrial flutter on Eliquis, who presents with shortness breath, leg edema. Pt has hx of atherosclerosis of native arteries of the extremities with ulceration. He was scheduled for angiography of the left lower extremity with the hope for intervention for limb salvage however pt found to have SOB and severe leg edema, concerning for CHF exacerbation. The procedure is canceled and we are asked to treat his CHF exacerbation.  MD assessment includes: Acute on chronic diastolic CHF, acute on chronic CKD IV, atrial flutter, HTN, Iron deficiency anemia.    Clinical Impression  Pt pleasant and motivated to participate during the session and was very HOH.  Pt reported that at baseline was able to don/doff his prosthesis, perform transfers, and amb limited distances at his ALF without assist from staff.  During the session pt presented with a significant decline in functional strength compared to his stated baseline.  Pt required significant time and effort to get from sup to sitting at the EOB and was SOB from the effort to do so requiring a therapeutic rest break before continuing with the session.  Pt required time and assist to don/doff his prosthesis and once donned needed mod A to come to standing from an elevated surface.  Pt originally tried to stand from where he felt his normal bed height was and was unable to do so.  Multiple attempts were made to ambulate at the EOB with pt ultimately unable to do so despite good effort.  Once back in sitting pt's SpO2 noted to be 87% which was the lowest noted reading of the session.  Pt's SpO2 returned to 90-91% after 45-60 sec with nursing notified.  Pt will  benefit from PT services in a SNF setting upon discharge to safely address deficits listed in patient problem list for decreased caregiver assistance and eventual return to PLOF.      Follow Up Recommendations SNF    Equipment Recommendations  None recommended by PT    Recommendations for Other Services       Precautions / Restrictions Precautions Precautions: Fall Precaution Comments: high fall risk, airborne and contact precautions Restrictions Weight Bearing Restrictions: No Other Position/Activity Restrictions: R BKA with prosthesis      Mobility  Bed Mobility Overal bed mobility: Modified Independent Bed Mobility: Supine to Sit;Sit to Supine Rolling: Modified independent (Device/Increase time)   Supine to sit: Min guard;HOB elevated Sit to supine: Min guard   General bed mobility comments: Extra time and effort required  Transfers Overall transfer level: Needs assistance Equipment used: Rolling walker (2 wheeled) Transfers: Sit to/from Stand Sit to Stand: Mod assist;From elevated surface         General transfer comment: With RLE prosthesis donned and min verbal cues for sequencing  Ambulation/Gait             General Gait Details: Multiple attempts made for pt to take steps at EOB with pt unable to do so secondary to fatigue/weakness; SpO2 down to a low of 87% with effort then back to 90-91%, nursing notified  Stairs            Wheelchair Mobility    Modified Rankin (Stroke Patients Only)       Balance Overall balance assessment: Needs  assistance Sitting-balance support: Single extremity supported Sitting balance-Leahy Scale: Good     Standing balance support: Bilateral upper extremity supported Standing balance-Leahy Scale: Poor Standing balance comment: Very heavy lean on the RW for support with difficulty coming to full standing                             Pertinent Vitals/Pain Pain Assessment: No/denies pain     Home Living Family/patient expects to be discharged to:: Assisted living               Home Equipment: Wheelchair - [employer] - 2 wheels;Bedside commode      Prior Function Level of Independence: Needs assistance   Gait / Transfers Assistance Needed: MOD I to don/doff prosthesis, for functional transfers, and to walk limited facility distances with a RW; no fall history  ADL's / Homemaking Assistance Needed: Patient stated he had assistance, if needed, for dressing and bathing, but completed toileting at MOD I.  Comments: use w/c predominantly for functional mobility     Hand Dominance   Dominant Hand: Right    Extremity/Trunk Assessment   Upper Extremity Assessment Upper Extremity Assessment: Generalized weakness    Lower Extremity Assessment Lower Extremity Assessment: Generalized weakness       Communication   Communication: HOH  Cognition Arousal/Alertness: Awake/alert Behavior During Therapy: WFL for tasks assessed/performed Overall Cognitive Status: Within Functional Limits for tasks assessed                                 General Comments: HOH, thus had difficulty understanding some PLOF questions      General Comments General comments (skin integrity, edema, etc.): Patient requires extra time to perform tasks due to SOB and poor activity tolerance.    Exercises Total Joint Exercises Ankle Circles/Pumps: AROM;Strengthening;Left;10 reps;5 reps Quad Sets: Strengthening;Both;10 reps Hip ABduction/ADduction: AROM;Both;Strengthening;10 reps Straight Leg Raises: AROM;Strengthening;Both;10 reps Long Arc Quad: AROM;Strengthening;Both;10 reps Knee Flexion: AROM;Strengthening;Both;10 reps Other Exercises Other Exercises: Assist with donning/doffing prosthesis   Assessment/Plan    PT Assessment Patient needs continued PT services  PT Problem List Decreased strength;Decreased activity tolerance;Decreased balance;Decreased mobility        PT Treatment Interventions DME instruction;Gait training;Therapeutic activities;Functional mobility training;Therapeutic exercise;Patient/family education;Balance training    PT Goals (Current goals can be found in the Care Plan section)  Acute Rehab PT Goals Patient Stated Goal: To get stronger PT Goal Formulation: With patient Time For Goal Achievement: 07/13/19 Potential to Achieve Goals: Fair    Frequency Min 2X/week   Barriers to discharge Decreased caregiver support      Co-evaluation               AM-PAC PT "6 Clicks" Mobility  Outcome Measure Help needed turning from your back to your side while in a flat bed without using bedrails?: A Little Help needed moving from lying on your back to sitting on the side of a flat bed without using bedrails?: A Little Help needed moving to and from a bed to a chair (including a wheelchair)?: A Lot Help needed standing up from a chair using your arms (e.g., wheelchair or bedside chair)?: A Lot Help needed to walk in hospital room?: Total Help needed climbing 3-5 steps with a railing? : Total 6 Click Score: 12    End of Session Equipment Utilized During Treatment: Gait belt Activity Tolerance: Patient limited by  fatigue Patient left: in bed;with call bell/phone within reach;with bed alarm set Nurse Communication: Mobility status;Other (comment)(SpO2 down to a low of 87% during session on room air) PT Visit Diagnosis: Unsteadiness on feet (R26.81);Muscle weakness (generalized) (M62.81);Difficulty in walking, not elsewhere classified (R26.2)    Time: 1610-9604 PT Time Calculation (min) (ACUTE ONLY): 41 min   Charges:   PT Evaluation $PT Eval Moderate Complexity: 1 Mod PT Treatments $Therapeutic Exercise: 8-22 mins $Therapeutic Activity: 8-22 mins       D. Royetta Asal PT, DPT 06/30/19, 3:41 PM

## 2019-06-30 NOTE — Progress Notes (Signed)
Initial Nutrition Assessment  DOCUMENTATION CODES:   Obesity unspecified  INTERVENTION:   Nepro Shake po daily, each supplement provides 425 kcal and 19 grams protein  MVI daily  Liberalize diet   NUTRITION DIAGNOSIS:   Inadequate oral intake related to acute illness as evidenced by meal completion < 50%.  GOAL:   Patient will meet greater than or equal to 90% of their needs  MONITOR:   PO intake, Supplement acceptance, Labs, Weight trends, Skin, I & O's  REASON FOR ASSESSMENT:   LOS    ASSESSMENT:   86 y.o. white male with insulin dependent diabetes, hypertension, COPD, peripheral vascular disease, congestive heart failure, hyperlipidemia, anemia, right below the knee amputation, CVA, gout, atrial fibrillation on eliquis, who was admitted to [facility] on 06/21/2019 for Acute on chronic diastolic CHF   Pt with decreased appetite and oral intake in hospital; pt eating <50% of most meals. Pt ate 25% of his breakfast this morning. Pt does drink Ensure at home sometimes. RD will add supplements to help pt meet his estimated needs. RD will also liberalize the renal portion of pt's diet as this is restrictive and pt is not eating enough to exceed nutrient limits. Per chart, pt ~20lbs above his UBW currently. Pt is down ~6lbs since admit.   Medications reviewed and include: aspirin, ferrous sulfate, insulin, ocuvite, miralax  Labs reviewed: K 4.5 wnl, BUN 71(H), creat 2.16(H), Mg 2.4 wnl Hgb 9.5(L), Hct 30.5(L) cbgs- 120, 118, 103 x 24 hrs AIC 7.7(H)- 2/22  NUTRITION - FOCUSED PHYSICAL EXAM:    Most Recent Value  Orbital Region  No depletion  Upper Arm Region  No depletion  Thoracic and Lumbar Region  No depletion  Buccal Region  No depletion  Temple Region  Mild depletion  Clavicle Bone Region  No depletion  Clavicle and Acromion Bone Region  No depletion  Scapular Bone Region  No depletion  Dorsal Hand  No depletion  Patellar Region  No depletion  Anterior Thigh Region   No depletion  Posterior Calf Region  No depletion  Edema (RD Assessment)  Mild  Hair  Reviewed  Eyes  Reviewed  Mouth  Reviewed  Skin  Reviewed  Nails  Reviewed     Diet Order:   Diet Order            Diet renal with fluid restriction Fluid restriction: 1500 mL Fluid; Room service appropriate? Yes; Fluid consistency: Thin  Diet effective now             EDUCATION NEEDS:   No education needs have been identified at this time  Skin:  Skin Assessment: Reviewed RN Assessment(ecchymosis, MASD)  Last BM:  2/20- type 6  Height:   Ht Readings from Last 1 Encounters:  06/24/19 5\' 8"  (1.727 m)    Weight:   Wt Readings from Last 1 Encounters:  06/30/19 108.2 kg    Ideal Body Weight:  65.8 kg(Adjusted for R BKA)  BMI:  Body mass index is 36.27 kg/m.  Estimated Nutritional Needs:   Kcal:  1900-2200kcal/day  Protein:  90-100g/day  Fluid:  1.6L/day  Koleen Distance MS, RD, LDN Contact information available in Amion

## 2019-06-30 NOTE — Progress Notes (Signed)
[facility]  ROUNDING NOTE   Subjective:   Patient states he is breathing better.   Creatinine 2.16 (2.36) (2.47) ( 2.94) (2.88) (2.37)  Objective:  Vital signs in last 24 hours:  Temp:  [97.7 F (36.5 C)-97.8 F (36.6 C)] 97.8 F (36.6 C) (02/25 0814) Pulse Rate:  [65-79] 79 (02/25 0814) Resp:  [16-18] 16 (02/25 0814) BP: (124-155)/(59-78) 155/78 (02/25 0814) SpO2:  [93 %-98 %] 96 % (02/25 0814) Weight:  [108.2 kg] 108.2 kg (02/25 0512)  Weight change: 44.6 kg Filed Weights   06/28/19 0502 06/29/19 0515 06/30/19 0512  Weight: 111.5 kg 63.6 kg 108.2 kg    Intake/Output: I/O last 3 completed shifts: In: 243 [P.O.:240; I.V.:3] Out: 1900 [Urine:1900]   Intake/Output this shift:  No intake/output data recorded.  Physical Exam: General: NAD, laying in bed  Head: Normocephalic, atraumatic. Moist oral mucosal membranes  Eyes: Anicteric, PERRL  Neck: Supple, trachea midline  Lungs:  Crackles at bases  Heart: irregular  Abdomen:  Soft, nontender, obese  Extremities:  + peripheral edema. Right BKA  Neurologic: Nonfocal, moving all four extremities  Skin: No lesions  GU: +scrotal edema    Basic Metabolic Panel: Recent Labs  Lab 06/24/19 0459 06/25/19 0534 06/26/19 0412 06/26/19 0412 06/27/19 0428 06/27/19 0428 06/28/19 0548 06/29/19 0627 06/30/19 0317  NA 138   < > 133*  --  134*  --  135 136 138  K 4.4   < > 4.6  --  4.7  --  4.4 4.3 4.5  CL 107   < > 104  --  104  --  104 105 106  CO2 25   < > 19*  --  21*  --  23 22 23   GLUCOSE 98   < > 112*  --  164*  --  142* 104* 126*  BUN 55*   < > 80*  --  88*  --  75* 74* 71*  CREATININE 1.81*   < > 2.88*  --  2.94*  --  2.47* 2.36* 2.16*  CALCIUM 7.9*   < > 7.8*   < > 7.7*   < > 7.9* 8.0* 8.0*  MG 2.2  --   --   --   --   --   --  2.6* 2.4  PHOS  --   --  5.7*  --   --   --   --   --   --    < > = values in this interval not displayed.    Liver Function Tests: No results for input(s): AST, ALT,  ALKPHOS, BILITOT, PROT, ALBUMIN in the last 168 hours. No results for input(s): LIPASE, AMYLASE in the last 168 hours. No results for input(s): AMMONIA in the last 168 hours.  CBC: Recent Labs  Lab 06/24/19 0459 06/26/19 0412 06/27/19 0428 06/28/19 0548 06/30/19 0317  WBC 6.4 7.1 6.7 6.6 6.8  HGB 9.2* 9.0* 9.3* 9.3* 9.5*  HCT 29.9* 29.5* 30.0* 29.4* 30.5*  MCV 98.0 101.0* 97.7 98.0 99.3  PLT 218 197 207 193 194    Cardiac Enzymes: No results for input(s): CKTOTAL, CKMB, CKMBINDEX, TROPONINI in the last 168 hours.  BNP: Invalid input(s): POCBNP  CBG: Recent Labs  Lab 06/29/19 1703 06/29/19 2108 06/30/19 0116 06/30/19 0329 06/30/19 0815  GLUCAP 189* 184* 120* 118* 103*    Microbiology: Results for orders placed or performed during the hospital encounter of 06/20/19  SARS CORONAVIRUS 2 (TAT 6-24 HRS) Nasopharyngeal Nasopharyngeal Swab  Status: None   Collection Time: 06/20/19  9:42 AM   Specimen: Nasopharyngeal Swab  Result Value Ref Range Status   SARS Coronavirus 2 NEGATIVE NEGATIVE Final    Comment: (NOTE) SARS-CoV-2 target nucleic acids are NOT DETECTED. The SARS-CoV-2 RNA is generally detectable in upper and lower respiratory specimens during the acute phase of infection. Negative results do not preclude SARS-CoV-2 infection, do not rule out co-infections with other pathogens, and should not be used as the sole basis for treatment or other patient management decisions. Negative results must be combined with clinical observations, patient history, and epidemiological information. The expected result is Negative. Fact Sheet for Patients: SugarRoll.be Fact Sheet for Healthcare Providers: https://www.woods-mathews.com/ This test is not yet approved or cleared by the [place] FDA and  has been authorized for detection and/or diagnosis of SARS-CoV-2 by FDA under an Emergency Use Authorization (EUA). This EUA will  remain  in effect (meaning this test can be used) for the duration of the COVID-19 declaration under Section 56 4(b)(1) of the Act, 21 U.S.C. section 360bbb-3(b)(1), unless the authorization is terminated or revoked sooner. Performed at [facility], [place] [address] [place], [place] [address]     Coagulation Studies: No results for input(s): LABPROT, INR in the last 72 hours.  Urinalysis: No results for input(s): COLORURINE, LABSPEC, PHURINE, GLUCOSEU, HGBUR, BILIRUBINUR, KETONESUR, PROTEINUR, UROBILINOGEN, NITRITE, LEUKOCYTESUR in the last 72 hours.  Invalid input(s): APPERANCEUR    Imaging: No results found.   Medications:   . sodium chloride    . sodium chloride     . apixaban  2.5 mg Oral BID  . aspirin EC  81 mg Oral Daily  . bumetanide (BUMEX) IV  0.5 mg Intravenous Daily  . felodipine  7.5 mg Oral Daily  . ferrous sulfate  325 mg Oral Daily  . hydrALAZINE  25 mg Oral TID  . insulin aspart  0-9 Units Subcutaneous Q4H  . insulin detemir  10 Units Subcutaneous q morning - 10a  . ipratropium-albuterol  3 mL Nebulization Once  . loratadine  10 mg Oral Daily  . metoprolol tartrate  25 mg Oral BID  . multivitamin-lutein  1 capsule Oral Daily  . polyethylene glycol  17 g Oral Daily  . pravastatin  40 mg Oral q1800  . sodium chloride flush  3 mL Intravenous Q12H  . sodium chloride irrigation  30 mL Irrigation QPM   sodium chloride, sodium chloride, acetaminophen, hydrALAZINE, morphine injection, ondansetron (ZOFRAN) IV, oxyCODONE, sodium chloride flush, sodium chloride flush  Assessment/ Plan:  [name] is a 86 y.o. white male with insulin dependent diabetes, hypertension, COPD, peripheral vascular disease, congestive heart failure, hyperlipidemia, anemia, right below the knee amputation, CVA, gout, atrial fibrillation on eliquis, who was admitted to [facility] on 06/21/2019 for Acute on chronic diastolic CHF (congestive heart failure) ([place]) [I50.33]    Underwent left lower extremity angiogram on 2/19.   1. Acute renal failure: with hyperkalemia on chronic kidney disease stage IIIB with proteinuria. Baseline creatinine of 1.79, GFR of 33 on 12/21/2017.  Chronic kidney disease secondary to diabetic nephropathy Acute renal failure secondary to contrast exposure and underlying cardio-renal syndrome.  Creatinine trending down.   2. Hypertension and acute exacerbation of diastolic congestive heart failure: then given IV fluids due to angiogram on 2/19. Now with volume overload with significant pulmonary, scrotal and pedal edema on examination.  Continue felodipine, metoprolol, hydralazine - bumetanide to 0.5 mg IV daily. May switch to PO tomorrow.  -  Monitor volume status - Continue to wean off oxygen.   3. Diabetes mellitus type II with chronic kidney disease: insulin dependent. Hemoglobin A1c of 7.7%   4. Anemia with chronic kidney disease: hemoglobin 9.3.  iron studies, vitamin O13 and folic acid at goal.    5. Hyponatremia: hypervolemic secondary to volume overload from acute exacerbation of diastolic congestive heart failure.  Improved with loop diuretics.    LOS: 9 Alston Berrie 2/25/20219:53 AM

## 2019-06-30 NOTE — Evaluation (Signed)
Occupational Therapy Evaluation Patient Details Name: Miguel Torres MRN: [id number] DOB: 1931/05/03 Today's Date: 06/30/2019    History of Present Illness Miguel Torres is a 86 y.o. male with medical history significant of hypertension, hyperlipidemia, diabetes mellitus, dCHF, iron deficiency anemia, CKD-4, s/p of right BKA, atrial flutter on Eliquis, who presents with shortness breath, leg edema. Pt has hx of atherosclerosis of native arteries of the extremities with ulceration. He is scheduled for angiography of the left lower extremity with the hope for intervention for limb salvage by VVS, however pt is found to have SOB and severe leg edema, concerning for CHF exacerbation. The procedure is canceled and we are asked to treat his CHF exacerbation.   Clinical Impression   Patient seen this afternoon for OT evaluation after acute on chronic CHF exacerbation.  Patient is very [facility] but agreeable to therapy.  Patient noted to be easily fatigued and required extra time for all tasks.  BP at 142/62 with no complaints of pain.  Patient able to move from supine<>EOB with HOB elevated with extra time and CGA.  Required B UEs to maintain sitting balance.  Able to scoot laterally along bed with CGA and extra time.  Educated patient on breathing techniques to improve activity tolerance and lung capacity.  Patient able to move back to supine in bed with CGA.  Patient has poor awareness of foley catheter and requires cues for safety.  The patient would benefit from skilled occupational therapy to address activity tolerance, strengthening, sitting tolerance/balance, ADL retraining, compensatory techniques, and energy conservation techniques.  Based on today's performance, recommending SNF at discharge.      Follow Up Recommendations  SNF    Equipment Recommendations  Other (comment)(defer to next level of care)    Recommendations for Other Services       Precautions / Restrictions  Precautions Precautions: Fall;Other (comment) Precaution Comments: high fall risk, airborne and contact precautions Restrictions Weight Bearing Restrictions: No Other Position/Activity Restrictions: R BKA, has prosthetic leg that he uses for functional transfers      Mobility Bed Mobility Overal bed mobility: Needs Assistance Bed Mobility: Rolling;Supine to Sit;Sit to Supine Rolling: Modified independent (Device/Increase time)   Supine to sit: Min guard;HOB elevated Sit to supine: Min guard   General bed mobility comments: Extra time due to SOB  Transfers                 General transfer comment: UNable to assess transfer at this time    Balance Overall balance assessment: Needs assistance Sitting-balance support: Single extremity supported                                       ADL either performed or assessed with clinical judgement   ADL Overall ADL's : Needs assistance/impaired     Grooming: Wash/dry hands;Wash/dry face;Oral care;Applying deodorant;Set up;Sitting Grooming Details (indicate cue type and reason): occasional CGA     Lower Body Bathing: Moderate assistance;Bed level   Upper Body Dressing : Min guard;Supervision/safety;Sitting   Lower Body Dressing: Moderate assistance;Sitting/lateral leans Lower Body Dressing Details (indicate cue type and reason): d/t lethargy               General ADL Comments: Per nurse, she states she feels like he is at baseline     Vision Patient Visual Report: No change from baseline       Perception  Praxis      Pertinent Vitals/Pain       Hand Dominance Right   Extremity/Trunk Assessment Upper Extremity Assessment Upper Extremity Assessment: Overall WFL for tasks assessed   Lower Extremity Assessment Lower Extremity Assessment: Defer to PT evaluation(R BKA)       Communication Communication Communication: HOH   Cognition Arousal/Alertness: Awake/alert Behavior During  Therapy: WFL for tasks assessed/performed Overall Cognitive Status: Within Functional Limits for tasks assessed                                 General Comments: HOH, thus had difficulty understanding some PLOF questions   General Comments  Patient requires extra time to perform tasks due to SOB and poor activity tolerance.    Exercises     Shoulder Instructions      Home Living Family/patient expects to be discharged to:: Assisted living([facility])                             Home Equipment: Wheelchair - manual          Prior Functioning/Environment Level of Independence: Needs assistance  Gait / Transfers Assistance Needed: MOD I to don/doff prosthesis for functional transfers ADL's / Homemaking Assistance Needed: Patient stated he had assistance, if needed, for dressing and bathing, but completed toileting at MOD I.   Comments: use w/c predominantly for functional mobility        OT Problem List: Decreased strength;Decreased activity tolerance;Impaired balance (sitting and/or standing);Cardiopulmonary status limiting activity      OT Treatment/Interventions: Self-care/ADL training;Therapeutic exercise;Energy conservation;DME and/or AE instruction;Therapeutic activities;Patient/family education    OT Goals(Current goals can be found in the care plan section) Acute Rehab OT Goals Patient Stated Goal: Breathe better OT Goal Formulation: With patient Time For Goal Achievement: 07/14/19 Potential to Achieve Goals: Good  OT Frequency: Min 1X/week   Barriers to D/C:            Co-evaluation              AM-PAC OT "6 Clicks" Daily Activity     Outcome Measure Help from another person eating meals?: None Help from another person taking care of personal grooming?: None Help from another person toileting, which includes using toliet, bedpan, or urinal?: A Lot Help from another person bathing (including washing, rinsing, drying)?: A  Lot Help from another person to put on and taking off regular upper body clothing?: A Little Help from another person to put on and taking off regular lower body clothing?: A Lot 6 Click Score: 17   End of Session Nurse Communication: Other (comment)(Discussed lab values prior to evaluation)  Activity Tolerance: Patient limited by fatigue Patient left: in bed;with call bell/phone within reach;with bed alarm set;Other (comment)(PT entered room)  OT Visit Diagnosis: Muscle weakness (generalized) (M62.81)                Time: 8657-8469 OT Time Calculation (min): 23 min Charges:  OT General Charges $OT Visit: 1 Visit OT Evaluation $OT Eval Moderate Complexity: 1 Mod OT Treatments $Therapeutic Activity: 23-37 mins  Baldomero Lamy, MS, OTR/L 06/30/19, 3:22 PM

## 2019-06-30 NOTE — Progress Notes (Signed)
PROGRESS NOTE    Miguel Torres  EUM:[id number] DOB: [birth year] DOA: 06/21/2019 PCP: [name], MD    Brief Narrative:  Miguel Skog Gerringeris a 86 y.o.malewith medical history significant ofhypertension, hyperlipidemia, diabetes mellitus,dCHF, iron deficiency anemia, CKD-4,s/p ofright BKA, atrial flutter on Eliquis, who presents with shortness breath, leg edema.  Pt has hx of atherosclerosis of native arteries of the extremities with ulceration. He is scheduled forangiography of theleftlower extremitywith the hope for intervention for limb salvageby VVS,howeverpt is found to have SOB andsevereleg edema, concerning for CHF exacerbation. The procedure is canceled and we are asked to treat his CHF exacerbation. Patient states that he has dry cough, shortness of breath, no chest pain, fever or chills. No nausea vomiting, diarrhea, abdominal pain, symptoms of UTI or unilateral weakness. He has a severe bilateral leg edema.  06/29/2019: Patient seen and examined.  Clinically feels shortness of breath is improving.  Discussed with nephrology consultants.  Volume status is overall improving.  Urine output adequate over the past 24 hours.  Diuretic dose decreased to Bumex 0.5 mg once daily  06/30/19: Patient seen and examined.  Shortness of breath improving.  Volume status improving.   Assessment & Plan:   Principal Problem:   Acute on chronic diastolic CHF (congestive heart failure) (HCC) Active Problems:   CKD (chronic kidney disease) stage 4, GFR 15-29 ml/min (HCC)   HLD (hyperlipidemia)   Atrial flutter (HCC)   HTN (hypertension)   Type II diabetes mellitus with renal manifestations (HCC)   Iron deficiency anemia   Atherosclerosis of artery of extremity with ulceration (HCC)  Acute on chronic diastolic CHF (congestive heart failure) [facility]): Patient had 3+ leg edema, elevated BNP, chest x-ray showed pulmonary edema, consistent with CHF exacerbation Patient had  exposure and subsequent administration of fluids Precipitated acute decompensated heart failure Worsening kidney function after Lasix administration -2d echo-EF 50 to 55%, EF is low normal function.  LV with global hypokinesis.  Grade 1 diastolic dysfunction.  Plan: Continue IV Bumex, 0.5 mg daily.  Plan to switch to p.o. tomorrow -Daily weights -strict I/O's -Low salt diet -Fluid restriction 1500   Acute on CKD (chronic kidney disease) stage 4, GFR 15-29 ml/min [facility]):  Slowly improving over interval Nephrology following Likely contrast induced on top of his chronic kidney disease//cardiorenal Baseline creatinine 1.79, GFR of 33 on 12/21/2017 Plan: Monitor urine output and I's and O Daily renal function Nephrology following   HLD (hyperlipidemia): -Pravastatin  Atrial flutter ([place]); -Continue metoprolol and Eliquis  HTN:  -Continue home medications:Felodipine, hydralazine, metoprolol, -hydralazine prn  Type II diabetes mellitus with renal manifestations ([place]): Most recent A1c7.1 Patient is takingNovoLog and Levemirat home Plan: Levemir 10 units daily SSI Carb controlled diet  Iron deficiency anemia:Hemoglobin 9.7 (10.1 on 01/25/2018) -Continue iron supplement  Atherosclerosis of artery of extremity with ulceration [facility]): Vascular following peripherally Tentative plans for repeat angiography with potential revascularization in a few weeks if renal function allows Dr. Delana Meyer familiar with patient   DVT prophylaxis: Eliquis Code Status: DNR Family Communication: [name] via phone, [phone], 06/29/2019 Disposition Plan: DC back to [facility] when euvolemia achieved and patient no longer requires IV diuretic.  Anticipate 24 hours.  Discussed with case management.  No anticipated barriers to discharge  Consultants:   Nephrology-[facility]  Procedures:   None  Antimicrobials:   None   Subjective: Patient  seen and examined No acute status changes overnight No new complaints  Objective: Vitals:   06/29/19 1945  06/30/19 0327 06/30/19 0512 06/30/19 0814  BP:  137/65  (!) 155/78  Pulse:  65  79  Resp:    16  Temp:  97.7 F (36.5 C)  97.8 F (36.6 C)  TempSrc:  Oral  Oral  SpO2: 97% 98%  96%  Weight:   108.2 kg   Height:        Intake/Output Summary (Last 24 hours) at 06/30/2019 1200 Last data filed at 06/30/2019 0930 Gross per 24 hour  Intake 243 ml  Output 600 ml  Net -357 ml   Filed Weights   06/28/19 0502 06/29/19 0515 06/30/19 0512  Weight: 111.5 kg 63.6 kg 108.2 kg    Examination:  General exam: Appears calm and comfortable  Respiratory system: Bibasilar crackles, no wheeze, normal respiratory effort Cardiovascular system: 2+ pedal edema bilaterally.  No murmurs.  S1-S2 heard gastrointestinal system: Abdomen is nondistended, soft and nontender. No organomegaly or masses felt. Normal bowel sounds heard. GU: Improved scrotal swelling Central nervous system: Alert and oriented. No focal neurological deficits. Extremities: Symmetric 5 x 5 power. Skin: No rashes, lesions or ulcers Psychiatry: Judgement and insight appear normal. Mood & affect appropriate.     Data Reviewed: I have personally reviewed following labs and imaging studies  CBC: Recent Labs  Lab 06/24/19 0459 06/26/19 0412 06/27/19 0428 06/28/19 0548 06/30/19 0317  WBC 6.4 7.1 6.7 6.6 6.8  HGB 9.2* 9.0* 9.3* 9.3* 9.5*  HCT 29.9* 29.5* 30.0* 29.4* 30.5*  MCV 98.0 101.0* 97.7 98.0 99.3  PLT 218 197 207 193 528   Basic Metabolic Panel: Recent Labs  Lab 06/24/19 0459 06/25/19 0534 06/26/19 0412 06/27/19 0428 06/28/19 0548 06/29/19 0627 06/30/19 0317  NA 138   < > 133* 134* 135 136 138  K 4.4   < > 4.6 4.7 4.4 4.3 4.5  CL 107   < > 104 104 104 105 106  CO2 25   < > 19* 21* 23 22 23   GLUCOSE 98   < > 112* 164* 142* 104* 126*  BUN 55*   < > 80* 88* 75* 74* 71*  CREATININE 1.81*   < > 2.88*  2.94* 2.47* 2.36* 2.16*  CALCIUM 7.9*   < > 7.8* 7.7* 7.9* 8.0* 8.0*  MG 2.2  --   --   --   --  2.6* 2.4  PHOS  --   --  5.7*  --   --   --   --    < > = values in this interval not displayed.   GFR: Estimated Creatinine Clearance: 29.3 mL/min (A) (by C-G formula based on SCr of 2.16 mg/dL (H)). Liver Function Tests: No results for input(s): AST, ALT, ALKPHOS, BILITOT, PROT, ALBUMIN in the last 168 hours. No results for input(s): LIPASE, AMYLASE in the last 168 hours. No results for input(s): AMMONIA in the last 168 hours. Coagulation Profile: No results for input(s): INR, PROTIME in the last 168 hours. Cardiac Enzymes: No results for input(s): CKTOTAL, CKMB, CKMBINDEX, TROPONINI in the last 168 hours. BNP (last 3 results) No results for input(s): PROBNP in the last 8760 hours. HbA1C: No results for input(s): HGBA1C in the last 72 hours. CBG: Recent Labs  Lab 06/29/19 2108 06/30/19 0116 06/30/19 0329 06/30/19 0815 06/30/19 1151  GLUCAP 184* 120* 118* 103* 164*   Lipid Profile: No results for input(s): CHOL, HDL, LDLCALC, TRIG, CHOLHDL, LDLDIRECT in the last 72 hours. Thyroid Function Tests: No results for input(s): TSH, T4TOTAL, FREET4, T3FREE, THYROIDAB  in the last 72 hours. Anemia Panel: No results for input(s): VITAMINB12, FOLATE, FERRITIN, TIBC, IRON, RETICCTPCT in the last 72 hours. Sepsis Labs: No results for input(s): PROCALCITON, LATICACIDVEN in the last 168 hours.  No results found for this or any previous visit (from the past 240 hour(s)).       Radiology Studies: No results found.      Scheduled Meds: . apixaban  2.5 mg Oral BID  . aspirin EC  81 mg Oral Daily  . bumetanide (BUMEX) IV  0.5 mg Intravenous Daily  . feeding supplement (NEPRO CARB STEADY)  237 mL Oral Q24H  . felodipine  7.5 mg Oral Daily  . ferrous sulfate  325 mg Oral Daily  . hydrALAZINE  25 mg Oral TID  . insulin aspart  0-9 Units Subcutaneous Q4H  . insulin detemir  10 Units  Subcutaneous q morning - 10a  . ipratropium-albuterol  3 mL Nebulization Once  . loratadine  10 mg Oral Daily  . metoprolol tartrate  25 mg Oral BID  . [START ON 07/01/2019] multivitamin with minerals  1 tablet Oral Daily  . polyethylene glycol  17 g Oral Daily  . pravastatin  40 mg Oral q1800  . sodium chloride flush  3 mL Intravenous Q12H  . sodium chloride irrigation  30 mL Irrigation QPM   Continuous Infusions: . sodium chloride    . sodium chloride       LOS: 9 days    Time spent: 35 minutes    Sidney Ace, MD [facility] Pager [phone]  If 7PM-7AM, please contact night-coverage  06/30/2019, 12:00 PM

## 2019-07-01 LAB — PROTEIN ELECTRO, RANDOM URINE
Albumin ELP, Urine: 66.9 %
Alpha-1-Globulin, U: 0.9 %
Alpha-2-Globulin, U: 3.5 %
Beta Globulin, U: 10.2 %
Gamma Globulin, U: 18.4 %
Total Protein, Urine: 69.4 mg/dL

## 2019-07-01 LAB — BASIC METABOLIC PANEL
Anion gap: 7 (ref 5–15)
BUN: 67 mg/dL — ABNORMAL HIGH (ref 8–23)
CO2: 24 mmol/L (ref 22–32)
Calcium: 8.3 mg/dL — ABNORMAL LOW (ref 8.9–10.3)
Chloride: 107 mmol/L (ref 98–111)
Creatinine, Ser: 2.01 mg/dL — ABNORMAL HIGH (ref 0.61–1.24)
GFR calc Af Amer: 34 mL/min — ABNORMAL LOW (ref >60)
GFR calc non Af Amer: 29 mL/min — ABNORMAL LOW (ref >60)
Glucose, Bld: 120 mg/dL — ABNORMAL HIGH (ref 70–99)
Potassium: 4.7 mmol/L (ref 3.5–5.1)
Sodium: 138 mmol/L (ref 135–145)

## 2019-07-01 LAB — GLUCOSE, CAPILLARY
Glucose-Capillary: 113 mg/dL — ABNORMAL HIGH (ref 70–99)
Glucose-Capillary: 105 mg/dL — ABNORMAL HIGH (ref 70–99)

## 2019-07-01 LAB — MAGNESIUM: Magnesium: 2.7 mg/dL — ABNORMAL HIGH (ref 1.7–2.4)

## 2019-07-01 MED ORDER — BUMETANIDE 0.5 MG PO TABS: 0.5000 mg | ORAL_TABLET | Freq: Every day | ORAL | Status: AC

## 2019-07-01 MED ORDER — ASPIRIN 81 MG PO TBEC: 81.0000 mg | DELAYED_RELEASE_TABLET | Freq: Every day | ORAL | Status: DC

## 2019-07-01 MED ORDER — BUMETANIDE 0.5 MG PO TABS
0.5000 mg | ORAL_TABLET | Freq: Every day | ORAL | Status: DC
  Filled 2019-07-01: qty 1

## 2019-07-01 NOTE — TOC Transition Note (Signed)
Transition of Care [facility]) - CM/SW Discharge Note   Patient Details  Name: Miguel Torres MRN: [id number] Date of Birth: 1930/09/02  [facility] CM/SW Contact:  Eileen Stanford, LCSW Phone Number: 07/01/2019, 11:05 AM   Clinical Narrative:    Clinical Social Worker facilitated patient discharge including contacting patient family and facility to confirm patient discharge plans.  Clinical information faxed to facility and family agreeable with plan.  CSW arranged ambulance transport via [facility] to [facility] .  RN to call for report prior to discharge.     Final next level of care: Skilled Nursing Facility Barriers to Discharge: No Barriers Identified   Patient Goals and CMS Choice     Choice offered to / list presented to : Patient  Discharge Placement              Patient chooses bed at: [facility] Patient to be transferred to facility by: [facility] Name of family member notified: Daughter [name] Patient and family notified of of transfer: 07/01/19  Discharge Plan and Services   Discharge Planning Services: CM Consult                                 Social Determinants of Health (SDOH) Interventions     Readmission Risk Interventions No flowsheet data found.

## 2019-07-01 NOTE — Progress Notes (Signed)
[facility]  ROUNDING NOTE   Subjective:   Breathing better. States he is feeling better.   Creatinine 2.01 (2.16) (2.36) (2.47) ( 2.94) (2.88) (2.37)  Objective:  Vital signs in last 24 hours:  Temp:  [97.8 F (36.6 C)] 97.8 F (36.6 C) (02/26 0733) Pulse Rate:  [64-96] 96 (02/26 0733) Resp:  [16-18] 18 (02/26 0733) BP: (129-147)/(60-73) 129/66 (02/26 0733) SpO2:  [90 %-98 %] 92 % (02/26 0733) Weight:  [109 kg] 109 kg (02/26 0436)  Weight change: 0.799 kg Filed Weights   06/29/19 0515 06/30/19 0512 07/01/19 0436  Weight: 63.6 kg 108.2 kg 109 kg    Intake/Output: I/O last 3 completed shifts: In: 58 [P.O.:720; I.V.:6] Out: 1000 [Urine:1000]   Intake/Output this shift:  No intake/output data recorded.  Physical Exam: General: NAD, laying in bed  Head: Normocephalic, atraumatic. Moist oral mucosal membranes  Eyes: Anicteric, PERRL  Neck: Supple, trachea midline  Lungs:  Crackles at bases  Heart: irregular  Abdomen:  Soft, nontender, obese  Extremities:  + peripheral edema. Right BKA  Neurologic: Nonfocal, moving all four extremities  Skin: No lesions  GU: +scrotal edema    Basic Metabolic Panel: Recent Labs  Lab 06/26/19 0412 06/26/19 6720 06/27/19 0428 06/27/19 0428 06/28/19 0548 06/28/19 0548 06/29/19 0627 06/30/19 0317 07/01/19 0605  NA 133*   < > 134*  --  135  --  136 138 138  K 4.6   < > 4.7  --  4.4  --  4.3 4.5 4.7  CL 104   < > 104  --  104  --  105 106 107  CO2 19*   < > 21*  --  23  --  22 23 24   GLUCOSE 112*   < > 164*  --  142*  --  104* 126* 120*  BUN 80*   < > 88*  --  75*  --  74* 71* 67*  CREATININE 2.88*   < > 2.94*  --  2.47*  --  2.36* 2.16* 2.01*  CALCIUM 7.8*   < > 7.7*   < > 7.9*   < > 8.0* 8.0* 8.3*  MG  --   --   --   --   --   --  2.6* 2.4 2.7*  PHOS 5.7*  --   --   --   --   --   --   --   --    < > = values in this interval not displayed.    Liver Function Tests: No results for input(s): AST, ALT,  ALKPHOS, BILITOT, PROT, ALBUMIN in the last 168 hours. No results for input(s): LIPASE, AMYLASE in the last 168 hours. No results for input(s): AMMONIA in the last 168 hours.  CBC: Recent Labs  Lab 06/26/19 0412 06/27/19 0428 06/28/19 0548 06/30/19 0317  WBC 7.1 6.7 6.6 6.8  HGB 9.0* 9.3* 9.3* 9.5*  HCT 29.5* 30.0* 29.4* 30.5*  MCV 101.0* 97.7 98.0 99.3  PLT 197 207 193 194    Cardiac Enzymes: No results for input(s): CKTOTAL, CKMB, CKMBINDEX, TROPONINI in the last 168 hours.  BNP: Invalid input(s): POCBNP  CBG: Recent Labs  Lab 06/30/19 1645 06/30/19 2031 06/30/19 2259 07/01/19 0430 07/01/19 0732  GLUCAP 255* 186* 134* 113* 105*    Microbiology: Results for orders placed or performed during the hospital encounter of 06/21/19  SARS CORONAVIRUS 2 (TAT 6-24 HRS) Nasopharyngeal Nasopharyngeal Swab     Status: None  Collection Time: 06/30/19  2:43 PM   Specimen: Nasopharyngeal Swab  Result Value Ref Range Status   SARS Coronavirus 2 NEGATIVE NEGATIVE Final    Comment: (NOTE) SARS-CoV-2 target nucleic acids are NOT DETECTED. The SARS-CoV-2 RNA is generally detectable in upper and lower respiratory specimens during the acute phase of infection. Negative results do not preclude SARS-CoV-2 infection, do not rule out co-infections with other pathogens, and should not be used as the sole basis for treatment or other patient management decisions. Negative results must be combined with clinical observations, patient history, and epidemiological information. The expected result is Negative. Fact Sheet for Patients: SugarRoll.be Fact Sheet for Healthcare Providers: https://www.woods-mathews.com/ This test is not yet approved or cleared by the [place] FDA and  has been authorized for detection and/or diagnosis of SARS-CoV-2 by FDA under an Emergency Use Authorization (EUA). This EUA will remain  in effect (meaning this test  can be used) for the duration of the COVID-19 declaration under Section 56 4(b)(1) of the Act, 21 U.S.C. section 360bbb-3(b)(1), unless the authorization is terminated or revoked sooner. Performed at [facility], [place] [address], [place], [place] [address]     Coagulation Studies: No results for input(s): LABPROT, INR in the last 72 hours.  Urinalysis: No results for input(s): COLORURINE, LABSPEC, PHURINE, GLUCOSEU, HGBUR, BILIRUBINUR, KETONESUR, PROTEINUR, UROBILINOGEN, NITRITE, LEUKOCYTESUR in the last 72 hours.  Invalid input(s): APPERANCEUR    Imaging: No results found.   Medications:   . sodium chloride    . sodium chloride     . apixaban  2.5 mg Oral BID  . aspirin EC  81 mg Oral Daily  . bumetanide (BUMEX) IV  0.5 mg Intravenous Daily  . feeding supplement (NEPRO CARB STEADY)  237 mL Oral Q24H  . felodipine  7.5 mg Oral Daily  . ferrous sulfate  325 mg Oral Daily  . hydrALAZINE  25 mg Oral TID  . insulin aspart  0-9 Units Subcutaneous Q4H  . insulin detemir  10 Units Subcutaneous q morning - 10a  . ipratropium-albuterol  3 mL Nebulization Once  . loratadine  10 mg Oral Daily  . metoprolol tartrate  25 mg Oral BID  . multivitamin with minerals  1 tablet Oral Daily  . polyethylene glycol  17 g Oral Daily  . pravastatin  40 mg Oral q1800  . sodium chloride flush  3 mL Intravenous Q12H  . sodium chloride irrigation  30 mL Irrigation QPM   sodium chloride, sodium chloride, acetaminophen, hydrALAZINE, morphine injection, ondansetron (ZOFRAN) IV, oxyCODONE, sodium chloride flush, sodium chloride flush  Assessment/ Plan:  [name] is a 86 y.o. white male with insulin dependent diabetes, hypertension, COPD, peripheral vascular disease, congestive heart failure, hyperlipidemia, anemia, right below the knee amputation, CVA, gout, atrial fibrillation on eliquis, who was admitted to [facility] on 06/21/2019 for Acute on chronic diastolic CHF (congestive heart  failure) ([place]) [I50.33]   Underwent left lower extremity angiogram on 2/19.   1. Acute renal failure: with hyperkalemia on chronic kidney disease stage IIIB with proteinuria. Baseline creatinine of 1.79, GFR of 33 on 12/21/2017.  Chronic kidney disease secondary to diabetic nephropathy Acute renal failure secondary to contrast exposure and underlying cardio-renal syndrome.  Creatinine trending down.   2. Hypertension and acute exacerbation of diastolic congestive heart failure: then given IV fluids due to angiogram on 2/19. Now with volume overload with significant pulmonary, scrotal and pedal edema on examination.  Continue felodipine, metoprolol, hydralazine - Change  to PO bumex today - Monitor volume status - Continue to wean off oxygen.   3. Diabetes mellitus type II with chronic kidney disease: insulin dependent. Hemoglobin A1c of 7.7%   4. Anemia with chronic kidney disease: hemoglobin 9.5.  iron studies, vitamin U68 and folic acid at goal.    5. Hyponatremia: hypervolemic secondary to volume overload from acute exacerbation of diastolic congestive heart failure.  Improved with loop diuretics.    LOS: [place] 2/26/202110:10 AM

## 2019-07-01 NOTE — Discharge Summary (Signed)
Physician Discharge Summary  Miguel Torres GQQ:[id number] DOB: Dec 15, 1930 DOA: 06/21/2019  PCP: [name], MD  Admit date: 06/21/2019 Discharge date: 07/01/2019  Admitted From: SNF Disposition:  SNF  Recommendations for Outpatient Follow-up:  1. Follow up with PCP in 1-2 weeks 2. Follow-up with nephrology as directed 3. Follow-up with vascular surgery as directed  Home Health: None Equipment/Devices: None Discharge Condition: Stable CODE STATUS: DNR Diet recommendation: Heart Healthy  Brief/Interim Summary: Miguel Reaume Gerringeris a 86 y.o.malewith medical history significant ofhypertension, hyperlipidemia, diabetes mellitus,dCHF, iron deficiency anemia, CKD-4,s/p ofright BKA, atrial flutter on Eliquis, who presents with shortness breath, leg edema.  Pt has hx of atherosclerosis of native arteries of the extremities with ulceration. He is scheduled forangiography of theleftlower extremitywith the hope for intervention for limb salvageby VVS,howeverpt is found to have SOB andsevereleg edema, concerning for CHF exacerbation. The procedure is canceled and we are asked to treat his CHF exacerbation. Patient states that he has dry cough, shortness of breath, no chest pain, fever or chills. No nausea vomiting, diarrhea, abdominal pain, symptoms of UTI or unilateral weakness. He has a severe bilateral leg edema.  06/29/2019: Patient seen and examined.  Clinically feels shortness of breath is improving.  Discussed with nephrology consultants.  Volume status is overall improving.  Urine output adequate over the past 24 hours.  Diuretic dose decreased to Bumex 0.5 mg once daily  06/30/19: Patient seen and examined.  Shortness of breath improving.  Volume status improving.  2/26: Patient seen and examined.  Shortness of breath essentially resolved.  Patient remains on room air.  Discussed with nephrology.  Will transition to p.o. diuretics.  Patient stable for discharge  back to skilled nursing facility.  Discharge Diagnoses:  Principal Problem:   Acute on chronic diastolic CHF (congestive heart failure) (HCC) Active Problems:   CKD (chronic kidney disease) stage 4, GFR 15-29 ml/min (HCC)   HLD (hyperlipidemia)   Atrial flutter (HCC)   HTN (hypertension)   Type II diabetes mellitus with renal manifestations (HCC)   Iron deficiency anemia   Atherosclerosis of artery of extremity with ulceration (HCC)  Acute on chronic diastolic CHF (congestive heart failure) [facility]): Patient had 3+ leg edema, elevated BNP, chest x-ray showed pulmonary edema, consistent with CHF exacerbation Patient had exposure and subsequent administration of fluids Precipitated acute decompensated heart failure Worsening kidney function after Lasix administration -2d echo-EF 50 to 55%, EF is low normal function. LV with global hypokinesis. Grade 1 diastolic dysfunction.  IV Bumex in house Patient net -8.3 L since admission Weights unreliable Discussed nephrology, transition to p.o. Bumex 0.5 mg daily on discharge DC previous Lasix Continue low-salt diet post discharge   Acute on CKD (chronic kidney disease) stage 4, GFR 15-29 ml/min [facility]):  Slowly improving over interval Nephrology following Likely contrast induced on top of his chronic kidney disease//cardiorenal Baseline creatinine 1.79, GFR of 33 on 12/21/2017 Creatinine 2.01 on day of discharge Stable, will be monitored in the outpatient setting   HLD (hyperlipidemia): -Pravastatin  Atrial flutter ([place]); -Continue metoprolol and Eliquis  HTN:  -Continue home medications:Felodipine, hydralazine, metoprolol, -hydralazine prn  Type II diabetes mellitus with renal manifestations ([place]): Most recent A1c7.1 Patient is takingNovoLog and Levemirat home Plan resume home regimen Discharge  Iron deficiency anemia:Hemoglobin 9.7 (10.1 on 01/25/2018) -Continue iron supplement  Atherosclerosis of artery of  extremity with ulceration [facility]): Vascular following peripherally Tentative plans for repeat angiography with potential revascularization in a few weeks if renal function allows Dr. Delana Meyer familiar  with patient  Discharge Instructions  Discharge Instructions    AMB referral to CHF clinic   Complete by: As directed    AMB referral to pulmonary rehabilitation   Complete by: As directed    Please select a program: Respiratory Care Services   Respiratory Care Services Diagnosis: Heart Failure   After initial evaluation and assessments completed: Virtual Based Care may be provided alone or in conjunction with Pulmonary Rehab/Respiratory Care services based on patient barriers.: Yes   Diet - low sodium heart healthy   Complete by: As directed    Increase activity slowly   Complete by: As directed      Allergies as of 07/01/2019   No Known Allergies     Medication List    STOP taking these medications   furosemide 40 MG tablet Commonly known as: LASIX     TAKE these medications   acetaminophen 325 MG tablet Commonly known as: TYLENOL Take 2 tablets (650 mg total) by mouth every 6 (six) hours as needed for mild pain (or Fever >/= 101). What changed:   how much to take  when to take this  reasons to take this   acetic acid 0.25 % irrigation Irrigate with 1 application as directed See admin instructions. Use 30 ml via irrigation every evening shift for decrease bacterial load of supra pubic catheter   apixaban 2.5 MG Tabs tablet Commonly known as: ELIQUIS Take 1 tablet (2.5 mg total) by mouth 2 (two) times daily.   aspirin 81 MG EC tablet Take 1 tablet (81 mg total) by mouth daily. Start taking on: July 02, 2019   beta carotene w/minerals tablet Take 1 tablet by mouth daily.   bumetanide 0.5 MG tablet Commonly known as: BUMEX Take 1 tablet (0.5 mg total) by mouth daily. Start taking on: July 02, 2019   felodipine 2.5 MG 24 hr tablet Commonly known as:  PLENDIL Take 7.5 mg by mouth daily.   ferrous sulfate 325 (65 FE) MG tablet Take 325 mg by mouth daily.   fexofenadine 60 MG tablet Commonly known as: ALLEGRA Take 60 mg by mouth daily as needed for rhinitis (seasonal allergies.).   hydrALAZINE 25 MG tablet Commonly known as: APRESOLINE Take 25 mg by mouth 3 (three) times daily.   insulin aspart 100 UNIT/ML injection Commonly known as: novoLOG Inject 0-9 Units into the skin 3 (three) times daily with meals. What changed:   how much to take  additional instructions   Levemir FlexTouch 100 UNIT/ML Pen Generic drug: Insulin Detemir Inject 22 Units into the skin See admin instructions. Inject 22 units subcutaneously daily in the morning & inject 32 units subcutaneously at bedtime.   lovastatin 40 MG tablet Commonly known as: MEVACOR TAKE 1 TABLET (40 MG TOTAL) BY MOUTH DAILY. What changed: See the new instructions.   metoprolol tartrate 25 MG tablet Commonly known as: LOPRESSOR Take 25 mg by mouth 2 (two) times daily.   Vitamin D3 1.25 MG (50000 UT) Caps Take 50,000 Units by mouth every Sunday.       Contact information for follow-up providers    [facility] Follow up on 07/07/2019.   Specialty: Cardiology Why: at 10:30am. Enter through the [place] entrance Contact information: [place] [phone]       Delana Meyer, Dolores Lory, MD Follow up in 2 week(s).   Specialties: Vascular Surgery, Cardiology, Radiology, Vascular Surgery Why: To see [place]. Will  need ABI with visit.  Contact information: [place] [address] [phone]            Contact information for after-discharge care    [facility] .   Service: Skilled Nursing Contact information: [place] [phone]                 No Known  Allergies  Consultations:   Nephrology -[place] kidney  Vascular surgery   Procedures/Studies: US RENAL  Result Date: 06/25/2019 CLINICAL DATA:  Chronic kidney disease stage 3. EXAM: RENAL / URINARY TRACT ULTRASOUND COMPLETE COMPARISON:  Report from renal ultrasound dated 08/08/2010 FINDINGS: Right Kidney: Renal measurements: 11.8 x 6.0 x 6.6 cm = volume: 246 mL . Echogenicity is increased. The kidney demonstrates a nodular contour. No mass or hydronephrosis visualized. Left Kidney: Renal measurements: 10.8 x 5.5 x 6.7 cm = volume: 208 mL. Echogenicity is increased. Renal cortex is thinned. No mass or hydronephrosis visualized. Bladder: Foley catheter in place.  No bladder abnormality. Other: None. IMPRESSION: Findings consistent with chronic kidney disease.  No hydronephrosis. Electronically Signed   By: Zerita Boers M.D.   On: 06/25/2019 17:28   PERIPHERAL VASCULAR CATHETERIZATION  Result Date: 06/24/2019 See Op Note  DG Chest Port 1 View  Result Date: 06/27/2019 CLINICAL DATA:  Congestive heart failure. EXAM: PORTABLE CHEST 1 VIEW COMPARISON:  Chest x-ray dated June 21, 2019. FINDINGS: Unchanged mild cardiomegaly. Unchanged mild diffuse interstitial thickening and trace bilateral pleural effusions. No pneumothorax. No acute osseous abnormality. IMPRESSION: 1. Unchanged mild congestive heart failure. Electronically Signed   By: Titus Dubin M.D.   On: 06/27/2019 15:22   DG Chest Port 1 View  Result Date: 06/21/2019 CLINICAL DATA:  Dyspnea, CHF EXAM: PORTABLE CHEST 1 VIEW COMPARISON:  12/17/2017 chest radiograph. FINDINGS: Stable cardiomediastinal silhouette with mild cardiomegaly. No pneumothorax. Trace bilateral pleural effusions. Mild diffuse prominence of the parahilar interstitial markings. IMPRESSION: Mild congestive heart failure with trace bilateral pleural effusions. Electronically Signed   By: Ilona Sorrel M.D.   On: 06/21/2019 10:06   ECHOCARDIOGRAM  COMPLETE  Result Date: 06/22/2019    ECHOCARDIOGRAM REPORT   Patient Name:   NICHOLES HIBLER Date of Exam: 06/21/2019 Medical Rec #:  [id number]          Height:       68.0 in Accession #:    [id number]         Weight:       220.0 lb Date of Birth:  01/16/1931           BSA:          2.13 m Patient Age:    11 years           BP:           143/102 mmHg Patient Gender: M                  HR:           64 bpm. Exam Location:  [facility] Procedure: 2D Echo, Cardiac Doppler and Color Doppler Indications:     O03.70 Acute Diastolic Congestive heart failure  History:         Patient has prior history of Echocardiogram examinations, most                  recent 11/02/2017. Risk Factors:Hypertension, Diabetes and  Dyslipidemia. Anemia. Congestive heart failure.  Sonographer:     Wilford Sports Rodgers-Jones Referring Phys:  [place] Diagnosing Phys: Neoma Laming MD IMPRESSIONS  1. Left ventricular ejection fraction, by estimation, is 50 to 55%. The left ventricle has low normal function. The left ventricle demonstrates global hypokinesis. The left ventricular internal cavity size was mildly to moderately dilated. There is mild  concentric left ventricular hypertrophy. Left ventricular diastolic parameters are consistent with Grade I diastolic dysfunction (impaired relaxation).  2. Right ventricular systolic function is moderately reduced. The right ventricular size is moderately enlarged. There is severely elevated pulmonary artery systolic pressure.  3. Left atrial size was mild to moderately dilated.  4. Right atrial size was mild to moderately dilated.  5. The mitral valve is degenerative. Trivial mitral valve regurgitation.  6. The tricuspid valve is degenerative. Tricuspid valve regurgitation is mild to moderate.  7. The aortic valve is tricuspid. Aortic valve regurgitation is trivial. Mild aortic valve sclerosis is present, with no evidence of aortic valve stenosis. FINDINGS  Left Ventricle: Left ventricular  ejection fraction, by estimation, is 50 to 55%. The left ventricle has low normal function. The left ventricle demonstrates global hypokinesis. The left ventricular internal cavity size was mildly to moderately dilated. There is mild concentric left ventricular hypertrophy. Left ventricular diastolic parameters are consistent with Grade I diastolic dysfunction (impaired relaxation). Right Ventricle: The right ventricular size is moderately enlarged. No increase in right ventricular wall thickness. Right ventricular systolic function is moderately reduced. There is severely elevated pulmonary artery systolic pressure. The tricuspid regurgitant velocity is 4.19 m/s, and with an assumed right atrial pressure of 10 mmHg, the estimated right ventricular systolic pressure is 16.1 mmHg. Left Atrium: Left atrial size was mild to moderately dilated. Right Atrium: Right atrial size was mild to moderately dilated. Pericardium: Trivial pericardial effusion is present. Mitral Valve: The mitral valve is degenerative in appearance. Moderate mitral annular calcification. Trivial mitral valve regurgitation. Tricuspid Valve: The tricuspid valve is degenerative in appearance. Tricuspid valve regurgitation is mild to moderate. Aortic Valve: The aortic valve is tricuspid. Aortic valve regurgitation is trivial. Mild aortic valve sclerosis is present, with no evidence of aortic valve stenosis. Pulmonic Valve: The pulmonic valve was normal in structure. Pulmonic valve regurgitation is trivial. Aorta: The aortic root, ascending aorta and aortic arch are all structurally normal, with no evidence of dilitation or obstruction. IAS/Shunts: No atrial level shunt detected by color flow Doppler.  LEFT VENTRICLE PLAX 2D LVIDd:         5.66 cm  Diastology LVIDs:         4.00 cm  LV e' lateral:   5.77 cm/s LV PW:         0.77 cm  LV E/e' lateral: 24.8 LV IVS:        0.79 cm  LV e' medial:    5.22 cm/s LVOT diam:     2.20 cm  LV E/e' medial:  27.4 LV  SV:         79.45 ml LV SV Index:   39.35 LVOT Area:     3.80 cm  RIGHT VENTRICLE RV Basal diam:  5.19 cm RV S prime:     8.70 cm/s TAPSE (M-mode): 1.2 cm LEFT ATRIUM              Index       RIGHT ATRIUM           Index LA diam:  5.20 cm  2.44 cm/m  RA Area:     21.70 cm LA Vol (A2C):   102.0 ml 47.93 ml/m RA Volume:   71.80 ml  33.74 ml/m LA Vol (A4C):   71.5 ml  33.60 ml/m LA Biplane Vol: 91.9 ml  43.18 ml/m  AORTIC VALVE LVOT Vmax:   98.40 cm/s LVOT Vmean:  64.100 cm/s LVOT VTI:    0.209 m  AORTA Ao Root diam: 3.40 cm Ao Asc diam:  4.00 cm MITRAL VALVE                TRICUSPID VALVE MV Area (PHT): 3.48 cm     TR Peak grad:   70.2 mmHg MV Decel Time: 218 msec     TR Vmax:        419.00 cm/s MV E velocity: 143.00 cm/s MV A velocity: 49.00 cm/s   SHUNTS MV E/A ratio:  2.92         Systemic VTI:  0.21 m                             Systemic Diam: 2.20 cm Neoma Laming MD Electronically signed by Neoma Laming MD Signature Date/Time: 06/22/2019/8:25:48 AM    Final    VAS [place] LOWER EXTREMITY ARTERIAL DUPLEX  Result Date: 06/16/2019 LOWER EXTREMITY ARTERIAL DUPLEX STUDY Indications: Peripheral artery disease.  Current ABI: Not obtained Performing Technologist: Almira Coaster RVS  Examination Guidelines: A complete evaluation includes B-mode imaging, spectral Doppler, color Doppler, and power Doppler as needed of all accessible portions of each vessel. Bilateral testing is considered an integral part of a complete examination. Limited examinations for reoccurring indications may be performed as noted.  +----------+--------+-----+--------+----------+--------+ LEFT      PSV cm/sRatioStenosisWaveform  Comments +----------+--------+-----+--------+----------+--------+ CFA Distal117                  biphasic           +----------+--------+-----+--------+----------+--------+ DFA       104                  triphasic          +----------+--------+-----+--------+----------+--------+ SFA Prox   127                  triphasic          +----------+--------+-----+--------+----------+--------+ SFA Mid   53                   monophasic         +----------+--------+-----+--------+----------+--------+ SFA Distal107                  monophasic         +----------+--------+-----+--------+----------+--------+ POP Prox  118                  triphasic          +----------+--------+-----+--------+----------+--------+ ATA Prox  20                   monophasic         +----------+--------+-----+--------+----------+--------+ PTA Prox  30                   monophasic         +----------+--------+-----+--------+----------+--------+  Summary: Left: Limited Study; Imaging and Waveforms obtained in the Left Lower Extremity. Leg Wraps on the Left Leg from Foot extending to Proximal Calf area.  See table(s) above for  measurements and observations. Electronically signed by Hortencia Pilar MD on 06/16/2019 at 5:21:17 PM.    Final     (Echo, Carotid, EGD, Colonoscopy, ERCP)    Subjective: Seen and examined on the day of discharge Feels well, no complaints Medically stable for discharge back to previous notes  Discharge Exam: Vitals:   07/01/19 0432 07/01/19 0733  BP: (!) 141/73 129/66  Pulse: 79 96  Resp: 16 18  Temp: 97.8 F (36.6 C) 97.8 F (36.6 C)  SpO2: 98% 92%   Vitals:   06/30/19 1942 07/01/19 0432 07/01/19 0436 07/01/19 0733  BP: 133/63 (!) 141/73  129/66  Pulse: 64 79  96  Resp: 16 16  18   Temp: 97.8 F (36.6 C) 97.8 F (36.6 C)  97.8 F (36.6 C)  TempSrc: Oral Oral  Oral  SpO2: 94% 98%  92%  Weight:   109 kg   Height:        General: Pt is alert, awake, not in acute distress Cardiovascular: RRR, S1/S2 +, no rubs, no gallops Respiratory: CTA bilaterally, no wheezing, no rhonchi Abdominal: Soft, NT, ND, bowel sounds + Extremities: no edema, no cyanosis status post right BKA    The results of significant diagnostics from this hospitalization  (including imaging, microbiology, ancillary and laboratory) are listed below for reference.     Microbiology: Recent Results (from the past 240 hour(s))  SARS CORONAVIRUS 2 (TAT 6-24 HRS) Nasopharyngeal Nasopharyngeal Swab     Status: None   Collection Time: 06/30/19  2:43 PM   Specimen: Nasopharyngeal Swab  Result Value Ref Range Status   SARS Coronavirus 2 NEGATIVE NEGATIVE Final    Comment: (NOTE) SARS-CoV-2 target nucleic acids are NOT DETECTED. The SARS-CoV-2 RNA is generally detectable in upper and lower respiratory specimens during the acute phase of infection. Negative results do not preclude SARS-CoV-2 infection, do not rule out co-infections with other pathogens, and should not be used as the sole basis for treatment or other patient management decisions. Negative results must be combined with clinical observations, patient history, and epidemiological information. The expected result is Negative. Fact Sheet for Patients: SugarRoll.be Fact Sheet for Healthcare Providers: https://www.woods-mathews.com/ This test is not yet approved or cleared by the [place] FDA and  has been authorized for detection and/or diagnosis of SARS-CoV-2 by FDA under an Emergency Use Authorization (EUA). This EUA will remain  in effect (meaning this test can be used) for the duration of the COVID-19 declaration under Section 56 4(b)(1) of the Act, 21 U.S.C. section 360bbb-3(b)(1), unless the authorization is terminated or revoked sooner. Performed at [facility], [place] [address] [place], [place] [address]      Labs: BNP (last 3 results) Recent Labs    06/21/19 1453 06/23/19 1623 06/27/19 0900  BNP 354.0* 341.0* 272.5*   Basic Metabolic Panel: Recent Labs  Lab 06/26/19 0412 06/26/19 0412 06/27/19 0428 06/28/19 0548 06/29/19 0627 06/30/19 0317 07/01/19 0605  NA 133*   < > 134* 135 136 138 138  K 4.6   < > 4.7 4.4 4.3 4.5 4.7   CL 104   < > 104 104 105 106 107  CO2 19*   < > 21* 23 22 23 24   GLUCOSE 112*   < > 164* 142* 104* 126* 120*  BUN 80*   < > 88* 75* 74* 71* 67*  CREATININE 2.88*   < > 2.94* 2.47* 2.36* 2.16* 2.01*  CALCIUM 7.8*   < > 7.7* 7.9* 8.0* 8.0*  8.3*  MG  --   --   --   --  2.6* 2.4 2.7*  PHOS 5.7*  --   --   --   --   --   --    < > = values in this interval not displayed.   Liver Function Tests: No results for input(s): AST, ALT, ALKPHOS, BILITOT, PROT, ALBUMIN in the last 168 hours. No results for input(s): LIPASE, AMYLASE in the last 168 hours. No results for input(s): AMMONIA in the last 168 hours. CBC: Recent Labs  Lab 06/26/19 0412 06/27/19 0428 06/28/19 0548 06/30/19 0317  WBC 7.1 6.7 6.6 6.8  HGB 9.0* 9.3* 9.3* 9.5*  HCT 29.5* 30.0* 29.4* 30.5*  MCV 101.0* 97.7 98.0 99.3  PLT 197 207 193 194   Cardiac Enzymes: No results for input(s): CKTOTAL, CKMB, CKMBINDEX, TROPONINI in the last 168 hours. BNP: Invalid input(s): POCBNP CBG: Recent Labs  Lab 06/30/19 1645 06/30/19 2031 06/30/19 2259 07/01/19 0430 07/01/19 0732  GLUCAP 255* 186* 134* 113* 105*   D-Dimer No results for input(s): DDIMER in the last 72 hours. Hgb A1c No results for input(s): HGBA1C in the last 72 hours. Lipid Profile No results for input(s): CHOL, HDL, LDLCALC, TRIG, CHOLHDL, LDLDIRECT in the last 72 hours. Thyroid function studies No results for input(s): TSH, T4TOTAL, T3FREE, THYROIDAB in the last 72 hours.  Invalid input(s): FREET3 Anemia work up No results for input(s): VITAMINB12, FOLATE, FERRITIN, TIBC, IRON, RETICCTPCT in the last 72 hours. Urinalysis    Component Value Date/Time   COLORURINE YELLOW 06/25/2019 1347   APPEARANCEUR TURBID (A) 06/25/2019 1347   LABSPEC 1.023 06/25/2019 1347   PHURINE 7.0 06/25/2019 1347   GLUCOSEU NEGATIVE 06/25/2019 1347   HGBUR SMALL (A) 06/25/2019 1347   BILIRUBINUR NEGATIVE 06/25/2019 1347   KETONESUR NEGATIVE 06/25/2019 1347   PROTEINUR 100  (A) 06/25/2019 1347   NITRITE NEGATIVE 06/25/2019 1347   LEUKOCYTESUR MODERATE (A) 06/25/2019 1347   Sepsis Labs Invalid input(s): PROCALCITONIN,  WBC,  LACTICIDVEN Microbiology Recent Results (from the past 240 hour(s))  SARS CORONAVIRUS 2 (TAT 6-24 HRS) Nasopharyngeal Nasopharyngeal Swab     Status: None   Collection Time: 06/30/19  2:43 PM   Specimen: Nasopharyngeal Swab  Result Value Ref Range Status   SARS Coronavirus 2 NEGATIVE NEGATIVE Final    Comment: (NOTE) SARS-CoV-2 target nucleic acids are NOT DETECTED. The SARS-CoV-2 RNA is generally detectable in upper and lower respiratory specimens during the acute phase of infection. Negative results do not preclude SARS-CoV-2 infection, do not rule out co-infections with other pathogens, and should not be used as the sole basis for treatment or other patient management decisions. Negative results must be combined with clinical observations, patient history, and epidemiological information. The expected result is Negative. Fact Sheet for Patients: SugarRoll.be Fact Sheet for Healthcare Providers: https://www.woods-mathews.com/ This test is not yet approved or cleared by the [place] FDA and  has been authorized for detection and/or diagnosis of SARS-CoV-2 by FDA under an Emergency Use Authorization (EUA). This EUA will remain  in effect (meaning this test can be used) for the duration of the COVID-19 declaration under Section 56 4(b)(1) of the Act, 21 U.S.C. section 360bbb-3(b)(1), unless the authorization is terminated or revoked sooner. Performed at [facility], [place] [address], [place], [place] [address]      Time coordinating discharge: Over 30 minutes  SIGNED:   Sidney Ace, MD  [facility] 07/01/2019, 10:47 AM Pager   If  7PM-7AM, please contact night-coverage

## 2019-07-06 ENCOUNTER — Telehealth: Payer: Self-pay | Admitting: Family

## 2019-07-06 NOTE — Progress Notes (Signed)
Patient ID: Miguel Torres, male    DOB: [birth year], 86 y.o.   MRN: [id number]  HPI  Miguel Torres is a 86 y/o male with a history of DM, hyperlipidemia, HTN, anemia, PVD and chronic heart failure.   Echo report from 06/21/19 reviewed and showed an EF of 50-55% along with trivia Miguel/AR, mild/moderate TR & severely elevated PA pressure.   Admitted 06/21/19 due to acute on chronic HF. Nephrology consult obtained. Initially needed IV diuretics and then transitioned to oral diuretics with resultant loss of 8.3L. Discharged after 10 days.   He presents today from SNF for his initial visit with a chief complaint of moderate shortness of breath upon minimal exertion. He says that this has been chronic having been present for several years. He has associated cough, left leg pedal edema and rhinorrhea along with this. He denies any difficulty sleeping, dizziness, abdominal distention, palpitations, chest pain or fatigue.   Says that he's getting weighed at [facility] but not on a daily basis. Per facility medication list, he's starting furosemide 20mg  daily for the next 4 days in addition to his bumetanide 0.5mg  daily.   Past Medical History:  Diagnosis Date  . Anemia   . CHF (congestive heart failure) ([place])   . Diabetes mellitus without complication ([place])   . Hyperlipidemia   . Hypertension   . PVD (peripheral vascular disease) ([place])   . Skin cancer    Past Surgical History:  Procedure Laterality Date  . BASAL CELL CARCINOMA EXCISION    . History of eye surgery Bilateral   . IR CATHETER TUBE CHANGE  03/01/2018  . LOWER EXTREMITY ANGIOGRAPHY Left 06/24/2019   Procedure: Lower Extremity Angiography;  Surgeon: Katha Cabal, MD;  Location: [place];  Service: Cardiovascular;  Laterality: Left;  . MOHS SURGERY  04/2011   the top of his head  . Right BKA  09/12/2010   Dr. Delana Meyer; Secondary gangrenous right LE  . TONSILLECTOMY     Family History  Problem Relation Age of  Onset  . Diabetes Mother   . Arthritis Sister   . Diabetes Daughter    Social History   Tobacco Use  . Smoking status: Never Smoker  . Smokeless tobacco: Never Used  Substance Use Topics  . Alcohol use: No   No Known Allergies Prior to Admission medications   Medication Sig Start Date End Date Taking? Authorizing Provider  acetaminophen (TYLENOL) 325 MG tablet Take 2 tablets (650 mg total) by mouth every 6 (six) hours as needed for mild pain (or Fever >/= 101). Patient taking differently: Take 325 mg by mouth every 4 (four) hours as needed for mild pain (general discomfort Max 3 grams/24hrs.).  12/09/17  Yes Gouru, Aruna, MD  acetic acid 0.25 % irrigation Irrigate with 1 application as directed See admin instructions. Use 30 ml via irrigation every evening shift for decrease bacterial load of supra pubic catheter   Yes [provider]  apixaban (ELIQUIS) 2.5 MG TABS tablet Take 1 tablet (2.5 mg total) by mouth 2 (two) times daily. 11/04/17  Yes Henreitta Leber, MD  aspirin EC 81 MG EC tablet Take 1 tablet (81 mg total) by mouth daily. 07/02/19  Yes Sreenath, Sudheer B, MD  beta carotene w/minerals (OCUVITE) tablet Take 1 tablet by mouth daily.   Yes [provider]  bumetanide (BUMEX) 0.5 MG tablet Take 1 tablet (0.5 mg total) by mouth daily. 07/02/19  Yes Sidney Ace, MD  Cholecalciferol (  VITAMIN D3) 50000 units CAPS Take 50,000 Units by mouth every Sunday.    Yes [provider]  felodipine (PLENDIL) 2.5 MG 24 hr tablet Take 7.5 mg by mouth daily.   Yes [provider]  ferrous sulfate 325 (65 FE) MG tablet Take 325 mg by mouth daily.    Yes [provider]  fexofenadine (ALLEGRA) 60 MG tablet Take 60 mg by mouth daily as needed for rhinitis (seasonal allergies.).    Yes [provider]  furosemide (LASIX) 20 MG tablet Take 20 mg by mouth daily. For 4 days   Yes [provider]  hydrALAZINE (APRESOLINE) 25 MG tablet Take  25 mg by mouth 3 (three) times daily.    Yes [provider]  insulin aspart (NOVOLOG) 100 UNIT/ML injection Inject 0-9 Units into the skin 3 (three) times daily with meals. Patient taking differently: Inject 3-11 Units into the skin 3 (three) times daily with meals. Sliding Scale Insulin:150-200=3 units, 201-250=5 units, 251-300=7 units, 301-350=9 units, 351-450=11 units, >450 call NP OR MD 12/21/17  Yes Epifanio Lesches, MD  LEVEMIR FLEXTOUCH 100 UNIT/ML Pen Inject 22 Units into the skin See admin instructions. Inject 22 units subcutaneously daily in the morning & inject 32 units subcutaneously at bedtime. 01/27/19  Yes [provider]  lovastatin (MEVACOR) 40 MG tablet TAKE 1 TABLET (40 MG TOTAL) BY MOUTH DAILY. Patient taking differently: Take 40 mg by mouth daily.  05/11/17  Yes [name], MD  metoprolol tartrate (LOPRESSOR) 25 MG tablet Take 25 mg by mouth 2 (two) times daily.   Yes [provider]    Review of Systems  Constitutional: Negative for appetite change and fatigue.  HENT: Positive for hearing loss and rhinorrhea. Negative for congestion and sore throat.   Eyes: Negative.   Respiratory: Positive for cough (productive cough) and shortness of breath (with little exertion).   Cardiovascular: Positive for leg swelling. Negative for chest pain and palpitations.  Gastrointestinal: Negative for abdominal distention and abdominal pain.  Endocrine: Negative.   Genitourinary:       Foley catheter present  Musculoskeletal: Negative for back pain and neck pain.  Skin: Positive for wound (left lower leg).  Allergic/Immunologic: Negative.   Neurological: Negative for dizziness and light-headedness.  Hematological: Negative for adenopathy. Bruises/bleeds easily.  Psychiatric/Behavioral: Negative for dysphoric mood and sleep disturbance (sleeping well). The patient is not nervous/anxious.     Vitals:   07/07/19 1022  BP: 124/80  Pulse: 81  Resp: 20   SpO2: 93%  Weight: 218 lb (98.9 kg)  Height: 5\' 8"  (1.727 m)   Wt Readings from Last 3 Encounters:  07/07/19 218 lb (98.9 kg)  07/01/19 240 lb 4.8 oz (109 kg)  06/13/19 215 lb (97.5 kg)   Lab Results  Component Value Date   CREATININE 2.01 (H) 07/01/2019   CREATININE 2.16 (H) 06/30/2019   CREATININE 2.36 (H) 06/29/2019    Physical Exam Vitals and nursing note reviewed.  Constitutional:      Appearance: He is well-developed.  HENT:     Right Ear: Decreased hearing noted.     Left Ear: Decreased hearing noted.  Neck:     Vascular: No JVD.  Cardiovascular:     Rate and Rhythm: Normal rate and regular rhythm.  Pulmonary:     Effort: Pulmonary effort is normal. No respiratory distress.     Breath sounds: No wheezing or rales.  Abdominal:     Palpations: Abdomen is soft.  Tenderness: There is no abdominal tenderness.  Musculoskeletal:     Cervical back: Normal range of motion and neck supple.     Left lower leg: No tenderness. Edema (1+ pitting (currently wrapped)) present.     Comments: Right BKA  Skin:    General: Skin is warm and dry.  Neurological:     General: No focal deficit present.     Mental Status: He is alert and oriented to person, place, and time.  Psychiatric:        Mood and Affect: Mood normal.        Behavior: Behavior normal.     Assessment & Plan:  1: Chronic heart failure with preserved ejection fraction- - NYHA class III - euvolemic today - order written for patient to be weighed daily and to call for an overnight weight gain of >2 pounds or a weekly weight gain of >5 pounds - just today was started on furosemide 20mg  daily for the next 4 days - not adding salt to his food and uses pepper instead - trying to elevate his legs to help reduce edema in the left leg (patient has right BKA) - BNP 06/27/19 was 447.0 - has received his flu and COVID vaccines  2: HTN- - BP looks good today - saw PCP Ouida Sills) at [facility] on 07/04/19 -  BMP 07/01/19 reviewed and showed sodium 138, potassium 4.7, creatinine 2.01 and GFR 29  3: DM- - fasting glucose at the facility this morning was 64 and he was then given orange juice - A1c 06/27/19 was 7.7%  4: PVD- - saw vascular [facility] Shark) 03/11/2019 - has right [place] medication list was reviewed.

## 2019-07-06 NOTE — Telephone Encounter (Signed)
Spoke with Nurse at [facility] where patient was d/c and confirmed his [facility] appt for tomorrow 3/4. Nurse stated he is getting daily weights and following low sodium diet, and taking meds as he is suppose to without issues.    Alyse Low, [place]

## 2019-07-07 ENCOUNTER — Telehealth: Payer: Self-pay | Admitting: Family

## 2019-07-07 ENCOUNTER — Other Ambulatory Visit: Payer: Self-pay

## 2019-07-07 ENCOUNTER — Ambulatory Visit: Payer: Medicare (Managed Care) | Attending: Family | Admitting: Family

## 2019-07-07 ENCOUNTER — Encounter: Payer: Self-pay | Admitting: Family

## 2019-07-07 VITALS — BP 124/80 | HR 81 | Resp 20 | Ht 68.0 in | Wt 218.0 lb

## 2019-07-07 DIAGNOSIS — Z7982 Long term (current) use of aspirin: Secondary | ICD-10-CM | POA: Insufficient documentation

## 2019-07-07 DIAGNOSIS — D649 Anemia, unspecified: Secondary | ICD-10-CM | POA: Diagnosis not present

## 2019-07-07 DIAGNOSIS — R05 Cough: Secondary | ICD-10-CM | POA: Insufficient documentation

## 2019-07-07 DIAGNOSIS — R0602 Shortness of breath: Secondary | ICD-10-CM | POA: Diagnosis present

## 2019-07-07 DIAGNOSIS — Z794 Long term (current) use of insulin: Secondary | ICD-10-CM | POA: Diagnosis not present

## 2019-07-07 DIAGNOSIS — I739 Peripheral vascular disease, unspecified: Secondary | ICD-10-CM

## 2019-07-07 DIAGNOSIS — J3489 Other specified disorders of nose and nasal sinuses: Secondary | ICD-10-CM | POA: Insufficient documentation

## 2019-07-07 DIAGNOSIS — I10 Essential (primary) hypertension: Secondary | ICD-10-CM

## 2019-07-07 DIAGNOSIS — E785 Hyperlipidemia, unspecified: Secondary | ICD-10-CM | POA: Insufficient documentation

## 2019-07-07 DIAGNOSIS — E1151 Type 2 diabetes mellitus with diabetic peripheral angiopathy without gangrene: Secondary | ICD-10-CM | POA: Diagnosis not present

## 2019-07-07 DIAGNOSIS — E1122 Type 2 diabetes mellitus with diabetic chronic kidney disease: Secondary | ICD-10-CM

## 2019-07-07 DIAGNOSIS — I110 Hypertensive heart disease with heart failure: Secondary | ICD-10-CM | POA: Diagnosis not present

## 2019-07-07 DIAGNOSIS — I5032 Chronic diastolic (congestive) heart failure: Secondary | ICD-10-CM | POA: Diagnosis not present

## 2019-07-07 DIAGNOSIS — Z89511 Acquired absence of right leg below knee: Secondary | ICD-10-CM | POA: Diagnosis not present

## 2019-07-07 DIAGNOSIS — Z7901 Long term (current) use of anticoagulants: Secondary | ICD-10-CM | POA: Diagnosis not present

## 2019-07-07 DIAGNOSIS — Z79899 Other long term (current) drug therapy: Secondary | ICD-10-CM | POA: Insufficient documentation

## 2019-07-07 NOTE — Telephone Encounter (Signed)
Opened in erro

## 2019-07-07 NOTE — Patient Instructions (Signed)
Continue weighing daily and call for an overnight weight gain of > 2 pounds or a weekly weight gain of >5 pounds. 

## 2019-07-14 ENCOUNTER — Other Ambulatory Visit (INDEPENDENT_AMBULATORY_CARE_PROVIDER_SITE_OTHER): Payer: Self-pay | Admitting: Vascular Surgery

## 2019-07-14 DIAGNOSIS — I70249 Atherosclerosis of native arteries of left leg with ulceration of unspecified site: Secondary | ICD-10-CM

## 2019-07-18 ENCOUNTER — Ambulatory Visit (INDEPENDENT_AMBULATORY_CARE_PROVIDER_SITE_OTHER): Payer: Medicare (Managed Care)

## 2019-07-18 ENCOUNTER — Other Ambulatory Visit: Payer: Self-pay

## 2019-07-18 ENCOUNTER — Ambulatory Visit (INDEPENDENT_AMBULATORY_CARE_PROVIDER_SITE_OTHER): Payer: Medicare (Managed Care) | Admitting: Nurse Practitioner

## 2019-07-18 VITALS — BP 149/81 | HR 65 | Resp 16

## 2019-07-18 DIAGNOSIS — N184 Chronic kidney disease, stage 4 (severe): Secondary | ICD-10-CM

## 2019-07-18 DIAGNOSIS — I70249 Atherosclerosis of native arteries of left leg with ulceration of unspecified site: Secondary | ICD-10-CM | POA: Diagnosis not present

## 2019-07-18 DIAGNOSIS — I7025 Atherosclerosis of native arteries of other extremities with ulceration: Secondary | ICD-10-CM | POA: Diagnosis not present

## 2019-07-18 DIAGNOSIS — S88111A Complete traumatic amputation at level between knee and ankle, right lower leg, initial encounter: Secondary | ICD-10-CM

## 2019-07-19 ENCOUNTER — Telehealth (INDEPENDENT_AMBULATORY_CARE_PROVIDER_SITE_OTHER): Payer: Self-pay

## 2019-07-19 NOTE — Telephone Encounter (Signed)
Patient was seen in our office from [facility] and is now scheduled for a LLE angio with Dr. Delana Meyer on 07/26/19 with a 9:00 am arrival time to the [facility]. Patient will do covid testing on 07/22/19 between 12:30-2:30 pm at the [facility]. Pre-procedure instructions will be faxed to [facility].

## 2019-07-20 ENCOUNTER — Encounter (INDEPENDENT_AMBULATORY_CARE_PROVIDER_SITE_OTHER): Payer: Self-pay | Admitting: Nurse Practitioner

## 2019-07-20 NOTE — Progress Notes (Signed)
SUBJECTIVE:  Patient ID: Miguel Torres, male    DOB: [birth year], 86 y.o.   MRN: [id number] Chief Complaint  Patient presents with  . Follow-up    [facility] 2week rle angio    HPI  Miguel Torres is a 86 y.o. male that presents today for follow-up studies after recent hospitalization approximately 2 weeks ago.  During his hospitalization the patient underwent angiogram on 06/23/2018.  However due to the patient's renal function images only were obtained.  This is due to the fact that they wanted to use as little contrast as possible due to worsening kidney function.  Based on this it was felt that the best course of action was to allow the patient to recover for a week or 2 and then attempt revascularization.  This will include possible atherectomy with focus on the anterior tibial artery. Today, the patient denies any fever, chills, nausea, vomiting or diarrhea.  The patient is edematous in his left lower extremity with some ulcerations.  There is also ulceration on his left foot.  He denies any issues with his right below-knee amputation.  The patient had ABIs performed today however due to the patient's body habitus as well as ulcerations and the fact that he is wheelchair-bound the study was limited.  The patient has a TBI 0.40 with dampened waveforms.  Previous TBI was noncompressible.  Past Medical History:  Diagnosis Date  . Anemia   . CHF (congestive heart failure) ([place])   . Diabetes mellitus without complication ([place])   . Hyperlipidemia   . Hypertension   . PVD (peripheral vascular disease) ([place])   . Skin cancer     Past Surgical History:  Procedure Laterality Date  . BASAL CELL CARCINOMA EXCISION    . History of eye surgery Bilateral   . IR CATHETER TUBE CHANGE  03/01/2018  . LOWER EXTREMITY ANGIOGRAPHY Left 06/24/2019   Procedure: Lower Extremity Angiography;  Surgeon: Katha Cabal, MD;  Location: [facility];  Service: Cardiovascular;  Laterality: Left;    . MOHS SURGERY  04/2011   the top of his head  . Right BKA  09/12/2010   Dr. Delana Meyer; Secondary gangrenous right LE  . TONSILLECTOMY      Social History   Socioeconomic History  . Marital status: Married    Spouse name: Not on file  . Number of children: 5  . Years of education: 5th grade  . Highest education level: Not on file  Occupational History  . Occupation: Retired  Tobacco Use  . Smoking status: Never Smoker  . Smokeless tobacco: Never Used  Substance and Sexual Activity  . Alcohol use: No  . Drug use: No  . Sexual activity: Not Currently  Other Topics Concern  . Not on file  Social History Narrative   Lives at home by himself.  Ambulates with a walker   Has a  right prosthetic leg   Social Determinants of Health   Financial Resource Strain:   . Difficulty of Paying Living Expenses:   Food Insecurity:   . Worried About Charity fundraiser in the Last Year:   . Arboriculturist in the Last Year:   Transportation Needs:   . Film/video editor (Medical):   [name] Lack of Transportation (Non-Medical):   Physical Activity:   . Days of Exercise per Week:   . Minutes of Exercise per Session:   Stress:   . Feeling of Stress :   Social Connections:   .  Frequency of Communication with Friends and Family:   . Frequency of Social Gatherings with Friends and Family:   . Attends Religious Services:   . Active Member of Clubs or Organizations:   . Attends Archivist Meetings:   [name] Marital Status:   Intimate Partner Violence:   . Fear of Current or Ex-Partner:   . Emotionally Abused:   [name] Physically Abused:   . Sexually Abused:     Family History  Problem Relation Age of Onset  . Diabetes Mother   . Arthritis Sister   . Diabetes Daughter     No Known Allergies   Review of Systems   Review of Systems: Negative Unless Checked Constitutional: [] Weight loss  [] Fever  [] Chills Cardiac: [] Chest pain   []  Atrial Fibrillation  [] Palpitations    [] Shortness of breath when laying flat   [] Shortness of breath with exertion. [] Shortness of breath at rest Vascular:  [] Pain in legs with walking   [] Pain in legs with standing [] Pain in legs when laying flat   [] Claudication    [] Pain in feet when laying flat    [] History of DVT   [] Phlebitis   [x] Swelling in legs   [] Varicose veins   [x] Non-healing ulcers Pulmonary:   [] Uses home oxygen   [] Productive cough   [] Hemoptysis   [] Wheeze  [] COPD   [] Asthma Neurologic:  [] Dizziness   [] Seizures  [] Blackouts [] History of stroke   [] History of TIA  [] Aphasia   [] Temporary Blindness   [] Weakness or numbness in arm   [] Weakness or numbness in leg Musculoskeletal:   [] Joint swelling   [] Joint pain   [] Low back pain  []  History of Knee Replacement [] Arthritis [] back Surgeries  []  Spinal Stenosis    Hematologic:  [] Easy bruising  [] Easy bleeding   [] Hypercoagulable state   [] Anemic Gastrointestinal:  [] Diarrhea   [] Vomiting  [] Gastroesophageal reflux/heartburn   [] Difficulty swallowing. [] Abdominal pain Genitourinary:  [] Chronic kidney disease   [] Difficult urination  [] Anuric   [] Blood in urine [] Frequent urination  [] Burning with urination   [] Hematuria Skin:  [] Rashes   [x] Ulcers [] Wounds Psychological:  [] History of anxiety   []  History of major depression  [x]  Memory Difficulties      OBJECTIVE:   Physical Exam  BP (!) 149/81 (BP Location: Right Arm)   Pulse 65   Resp 16   Gen: WD/WN, NAD Head: [place]/AT, No temporalis wasting.  Ear/Nose/Throat: Hearing grossly intact, nares w/o erythema or drainage Eyes: PER, EOMI, sclera nonicteric.  Neck: Supple, no masses.  No JVD.  Pulmonary:  Good air movement, no use of accessory muscles.  Cardiac: RRR Vascular:  3+ edema left lower extremity, too edematous to palpate pulses Vessel Right Left  Radial Palpable Palpable   Gastrointestinal: soft, non-distended. No guarding/no peritoneal signs.  Musculoskeletal: M/S 5/5 throughout.  No deformity or atrophy.   Neurologic: Pain and light touch intact in extremities.  Symmetrical.  Speech is fluent. Motor exam as listed above. Psychiatric: Judgment intact, Mood & affect appropriate for pt's clinical situation. Dermatologic: No Venous rashes. No Ulcers Noted.  No changes consistent with cellulitis. Lymph : No Cervical lymphadenopathy, no lichenification or skin changes of chronic lymphedema.       ASSESSMENT AND PLAN:  1. Amputation of right lower extremity below knee upon examination [facility]) Currently patient is doing well and denies any issues with his below-knee amputation.  2. CKD (chronic kidney disease) stage 4, GFR 15-29 ml/min (HCC) Patient was fairly edematous and had an Unna wrap  placed on his left lower extremity when he was brought to our office.  We have replaced his interact today and he will continue to be changed at his facility.  We will also attempt to use as little dye as possible due to the patient's worsening renal function will perform angiogram.  3. Atherosclerosis of native arteries of the extremities with ulceration ([place])  Recommend:  The patient has evidence of severe atherosclerotic changes of both lower extremities associated with ulceration and tissue loss of the foot.  This represents a limb threatening ischemia and places the patient at the risk for limb loss.  Patient should undergo angiography of the lower extremities with the hope for intervention for limb salvage.  The risks and benefits as well as the alternative therapies was discussed in detail with the patient.  All questions were answered.  Patient agrees to proceed with angiography.  The patient will follow up with me in the office after the procedure.     Current Outpatient Medications on File Prior to Visit  Medication Sig Dispense Refill  . acetaminophen (TYLENOL) 325 MG tablet Take 2 tablets (650 mg total) by mouth every 6 (six) hours as needed for mild pain (or Fever >/= 101). (Patient taking  differently: Take 325 mg by mouth every 4 (four) hours as needed for mild pain (general discomfort Max 3 grams/24hrs.). )    . acetic acid 0.25 % irrigation Irrigate with 1 application as directed See admin instructions. Use 30 ml via irrigation every evening shift for decrease bacterial load of supra pubic catheter    . apixaban (ELIQUIS) 2.5 MG TABS tablet Take 1 tablet (2.5 mg total) by mouth 2 (two) times daily. 60 tablet   . aspirin EC 81 MG EC tablet Take 1 tablet (81 mg total) by mouth daily.    . beta carotene w/minerals (OCUVITE) tablet Take 1 tablet by mouth daily.    . bumetanide (BUMEX) 0.5 MG tablet Take 1 tablet (0.5 mg total) by mouth daily.    . Cholecalciferol (VITAMIN D3) 50000 units CAPS Take 50,000 Units by mouth every Sunday.     . docusate sodium (COLACE) 100 MG capsule Take 100 mg by mouth daily.    . felodipine (PLENDIL) 2.5 MG 24 hr tablet Take 7.5 mg by mouth daily.    . ferrous sulfate 325 (65 FE) MG tablet Take 325 mg by mouth daily.     . fexofenadine (ALLEGRA) 60 MG tablet Take 60 mg by mouth daily as needed for rhinitis (seasonal allergies.).     [name] furosemide (LASIX) 20 MG tablet Take 20 mg by mouth daily. For 4 days    . hydrALAZINE (APRESOLINE) 25 MG tablet Take 25 mg by mouth 3 (three) times daily.     . insulin aspart (NOVOLOG) 100 UNIT/ML injection Inject 0-9 Units into the skin 3 (three) times daily with meals. (Patient taking differently: Inject 3-11 Units into the skin 3 (three) times daily with meals. Sliding Scale Insulin:150-200=3 units, 201-250=5 units, 251-300=7 units, 301-350=9 units, 351-450=11 units, >450 call NP OR MD) 10 mL 11  . LEVEMIR FLEXTOUCH 100 UNIT/ML Pen Inject 22 Units into the skin See admin instructions. Inject 22 units subcutaneously daily in the morning & inject 32 units subcutaneously at bedtime.    . lovastatin (MEVACOR) 40 MG tablet TAKE 1 TABLET (40 MG TOTAL) BY MOUTH DAILY. (Patient taking differently: Take 40 mg by mouth daily. ) 90  tablet 3  . metoprolol tartrate (LOPRESSOR) 25 MG tablet  Take 25 mg by mouth 2 (two) times daily.     No current facility-administered medications on file prior to visit.    There are no Patient Instructions on file for this visit. No follow-ups on file.   Kris Hartmann, NP  This note was completed with Sales executive.  Any errors are purely unintentional.

## 2019-07-22 ENCOUNTER — Other Ambulatory Visit
Admission: RE | Admit: 2019-07-22 | Discharge: 2019-07-22 | Disposition: A | Discharge status: Home / Self Care | Payer: Medicare (Managed Care) | Source: Ambulatory Visit | Attending: Vascular Surgery | Admitting: Vascular Surgery

## 2019-07-22 ENCOUNTER — Other Ambulatory Visit: Payer: Self-pay

## 2019-07-22 DIAGNOSIS — Z01812 Encounter for preprocedural laboratory examination: Secondary | ICD-10-CM | POA: Insufficient documentation

## 2019-07-22 DIAGNOSIS — R778 Other specified abnormalities of plasma proteins: Secondary | ICD-10-CM | POA: Diagnosis not present

## 2019-07-22 DIAGNOSIS — U071 COVID-19: Secondary | ICD-10-CM | POA: Diagnosis not present

## 2019-07-23 LAB — SARS CORONAVIRUS 2 (TAT 6-24 HRS): SARS Coronavirus 2: POSITIVE — AB

## 2019-07-25 ENCOUNTER — Encounter: Payer: Self-pay | Admitting: Emergency Medicine

## 2019-07-25 ENCOUNTER — Other Ambulatory Visit (INDEPENDENT_AMBULATORY_CARE_PROVIDER_SITE_OTHER): Payer: Self-pay | Admitting: Nurse Practitioner

## 2019-07-25 ENCOUNTER — Other Ambulatory Visit: Payer: Self-pay

## 2019-07-25 ENCOUNTER — Telehealth (HOSPITAL_COMMUNITY): Payer: Self-pay | Admitting: Nurse Practitioner

## 2019-07-25 ENCOUNTER — Telehealth (INDEPENDENT_AMBULATORY_CARE_PROVIDER_SITE_OTHER): Payer: Self-pay

## 2019-07-25 ENCOUNTER — Emergency Department: Payer: Medicare (Managed Care)

## 2019-07-25 ENCOUNTER — Inpatient Hospital Stay
Admission: EM | Admit: 2019-07-25 | Discharge: 2019-08-12 | DRG: 177 | Disposition: A | Discharge status: Skilled Nursing Facility | Payer: Medicare (Managed Care) | Source: Skilled Nursing Facility | Attending: Internal Medicine | Admitting: Internal Medicine

## 2019-07-25 DIAGNOSIS — I4892 Unspecified atrial flutter: Secondary | ICD-10-CM | POA: Diagnosis present

## 2019-07-25 DIAGNOSIS — E1121 Type 2 diabetes mellitus with diabetic nephropathy: Secondary | ICD-10-CM | POA: Diagnosis not present

## 2019-07-25 DIAGNOSIS — D500 Iron deficiency anemia secondary to blood loss (chronic): Secondary | ICD-10-CM | POA: Diagnosis not present

## 2019-07-25 DIAGNOSIS — I272 Pulmonary hypertension, unspecified: Secondary | ICD-10-CM | POA: Diagnosis present

## 2019-07-25 DIAGNOSIS — E785 Hyperlipidemia, unspecified: Secondary | ICD-10-CM | POA: Diagnosis present

## 2019-07-25 DIAGNOSIS — U071 COVID-19: Principal | ICD-10-CM | POA: Diagnosis present

## 2019-07-25 DIAGNOSIS — N390 Urinary tract infection, site not specified: Secondary | ICD-10-CM | POA: Diagnosis present

## 2019-07-25 DIAGNOSIS — Z8261 Family history of arthritis: Secondary | ICD-10-CM

## 2019-07-25 DIAGNOSIS — E876 Hypokalemia: Secondary | ICD-10-CM | POA: Diagnosis not present

## 2019-07-25 DIAGNOSIS — E1152 Type 2 diabetes mellitus with diabetic peripheral angiopathy with gangrene: Secondary | ICD-10-CM | POA: Diagnosis present

## 2019-07-25 DIAGNOSIS — Z7982 Long term (current) use of aspirin: Secondary | ICD-10-CM

## 2019-07-25 DIAGNOSIS — L97929 Non-pressure chronic ulcer of unspecified part of left lower leg with unspecified severity: Secondary | ICD-10-CM | POA: Diagnosis present

## 2019-07-25 DIAGNOSIS — Z794 Long term (current) use of insulin: Secondary | ICD-10-CM | POA: Diagnosis not present

## 2019-07-25 DIAGNOSIS — Z66 Do not resuscitate: Secondary | ICD-10-CM | POA: Diagnosis not present

## 2019-07-25 DIAGNOSIS — Z7901 Long term (current) use of anticoagulants: Secondary | ICD-10-CM

## 2019-07-25 DIAGNOSIS — R0902 Hypoxemia: Secondary | ICD-10-CM | POA: Diagnosis present

## 2019-07-25 DIAGNOSIS — N184 Chronic kidney disease, stage 4 (severe): Secondary | ICD-10-CM | POA: Diagnosis present

## 2019-07-25 DIAGNOSIS — N2581 Secondary hyperparathyroidism of renal origin: Secondary | ICD-10-CM | POA: Diagnosis present

## 2019-07-25 DIAGNOSIS — E1122 Type 2 diabetes mellitus with diabetic chronic kidney disease: Secondary | ICD-10-CM | POA: Diagnosis not present

## 2019-07-25 DIAGNOSIS — T380X5A Adverse effect of glucocorticoids and synthetic analogues, initial encounter: Secondary | ICD-10-CM | POA: Diagnosis not present

## 2019-07-25 DIAGNOSIS — S88111A Complete traumatic amputation at level between knee and ankle, right lower leg, initial encounter: Secondary | ICD-10-CM | POA: Diagnosis not present

## 2019-07-25 DIAGNOSIS — E11649 Type 2 diabetes mellitus with hypoglycemia without coma: Secondary | ICD-10-CM | POA: Diagnosis not present

## 2019-07-25 DIAGNOSIS — Z89511 Acquired absence of right leg below knee: Secondary | ICD-10-CM | POA: Diagnosis not present

## 2019-07-25 DIAGNOSIS — I7025 Atherosclerosis of native arteries of other extremities with ulceration: Secondary | ICD-10-CM | POA: Diagnosis not present

## 2019-07-25 DIAGNOSIS — D638 Anemia in other chronic diseases classified elsewhere: Secondary | ICD-10-CM | POA: Diagnosis present

## 2019-07-25 DIAGNOSIS — I739 Peripheral vascular disease, unspecified: Secondary | ICD-10-CM | POA: Diagnosis present

## 2019-07-25 DIAGNOSIS — I959 Hypotension, unspecified: Secondary | ICD-10-CM | POA: Diagnosis not present

## 2019-07-25 DIAGNOSIS — I452 Bifascicular block: Secondary | ICD-10-CM | POA: Diagnosis present

## 2019-07-25 DIAGNOSIS — L89150 Pressure ulcer of sacral region, unstageable: Secondary | ICD-10-CM

## 2019-07-25 DIAGNOSIS — I5033 Acute on chronic diastolic (congestive) heart failure: Secondary | ICD-10-CM | POA: Diagnosis not present

## 2019-07-25 DIAGNOSIS — I4891 Unspecified atrial fibrillation: Secondary | ICD-10-CM | POA: Diagnosis present

## 2019-07-25 DIAGNOSIS — L89322 Pressure ulcer of left buttock, stage 2: Secondary | ICD-10-CM | POA: Diagnosis present

## 2019-07-25 DIAGNOSIS — I5082 Biventricular heart failure: Secondary | ICD-10-CM | POA: Diagnosis present

## 2019-07-25 DIAGNOSIS — K921 Melena: Secondary | ICD-10-CM | POA: Diagnosis not present

## 2019-07-25 DIAGNOSIS — Y738 Miscellaneous gastroenterology and urology devices associated with adverse incidents, not elsewhere classified: Secondary | ICD-10-CM | POA: Diagnosis present

## 2019-07-25 DIAGNOSIS — T83510A Infection and inflammatory reaction due to cystostomy catheter, initial encounter: Secondary | ICD-10-CM | POA: Diagnosis present

## 2019-07-25 DIAGNOSIS — L89891 Pressure ulcer of other site, stage 1: Secondary | ICD-10-CM | POA: Diagnosis not present

## 2019-07-25 DIAGNOSIS — I5043 Acute on chronic combined systolic (congestive) and diastolic (congestive) heart failure: Secondary | ICD-10-CM | POA: Diagnosis present

## 2019-07-25 DIAGNOSIS — N179 Acute kidney failure, unspecified: Secondary | ICD-10-CM | POA: Diagnosis not present

## 2019-07-25 DIAGNOSIS — Z515 Encounter for palliative care: Secondary | ICD-10-CM | POA: Diagnosis not present

## 2019-07-25 DIAGNOSIS — K922 Gastrointestinal hemorrhage, unspecified: Secondary | ICD-10-CM | POA: Diagnosis not present

## 2019-07-25 DIAGNOSIS — Z1629 Resistance to other single specified antibiotic: Secondary | ICD-10-CM | POA: Diagnosis not present

## 2019-07-25 DIAGNOSIS — Z7189 Other specified counseling: Secondary | ICD-10-CM | POA: Diagnosis not present

## 2019-07-25 DIAGNOSIS — R7989 Other specified abnormal findings of blood chemistry: Secondary | ICD-10-CM | POA: Diagnosis not present

## 2019-07-25 DIAGNOSIS — I130 Hypertensive heart and chronic kidney disease with heart failure and stage 1 through stage 4 chronic kidney disease, or unspecified chronic kidney disease: Secondary | ICD-10-CM | POA: Diagnosis present

## 2019-07-25 DIAGNOSIS — R778 Other specified abnormalities of plasma proteins: Secondary | ICD-10-CM | POA: Diagnosis present

## 2019-07-25 DIAGNOSIS — D62 Acute posthemorrhagic anemia: Secondary | ICD-10-CM | POA: Diagnosis not present

## 2019-07-25 DIAGNOSIS — I495 Sick sinus syndrome: Secondary | ICD-10-CM | POA: Diagnosis present

## 2019-07-25 DIAGNOSIS — L89812 Pressure ulcer of head, stage 2: Secondary | ICD-10-CM | POA: Diagnosis present

## 2019-07-25 DIAGNOSIS — I214 Non-ST elevation (NSTEMI) myocardial infarction: Secondary | ICD-10-CM | POA: Diagnosis present

## 2019-07-25 DIAGNOSIS — Z833 Family history of diabetes mellitus: Secondary | ICD-10-CM

## 2019-07-25 DIAGNOSIS — D631 Anemia in chronic kidney disease: Secondary | ICD-10-CM | POA: Diagnosis present

## 2019-07-25 LAB — TROPONIN I (HIGH SENSITIVITY)
Troponin I (High Sensitivity): 942 ng/L (ref <18)
Troponin I (High Sensitivity): 983 ng/L (ref <18)

## 2019-07-25 LAB — BASIC METABOLIC PANEL
Anion gap: 10 (ref 5–15)
BUN: 63 mg/dL — ABNORMAL HIGH (ref 8–23)
CO2: 23 mmol/L (ref 22–32)
Calcium: 7.8 mg/dL — ABNORMAL LOW (ref 8.9–10.3)
Chloride: 96 mmol/L — ABNORMAL LOW (ref 98–111)
Creatinine, Ser: 2.58 mg/dL — ABNORMAL HIGH (ref 0.61–1.24)
GFR calc Af Amer: 25 mL/min — ABNORMAL LOW (ref >60)
GFR calc non Af Amer: 22 mL/min — ABNORMAL LOW (ref >60)
Glucose, Bld: 112 mg/dL — ABNORMAL HIGH (ref 70–99)
Potassium: 4 mmol/L (ref 3.5–5.1)
Sodium: 129 mmol/L — ABNORMAL LOW (ref 135–145)

## 2019-07-25 LAB — URINALYSIS, ROUTINE W REFLEX MICROSCOPIC
Bilirubin Urine: NEGATIVE
Glucose, UA: NEGATIVE mg/dL
Hgb urine dipstick: NEGATIVE
Ketones, ur: NEGATIVE mg/dL
Nitrite: NEGATIVE
Protein, ur: 100 mg/dL — AB
Specific Gravity, Urine: 1.011 (ref 1.005–1.030)
Squamous Epithelial / HPF: NONE SEEN (ref 0–5)
pH: 9 — ABNORMAL HIGH (ref 5.0–8.0)

## 2019-07-25 LAB — LACTIC ACID, PLASMA: Lactic Acid, Venous: 1 mmol/L (ref 0.5–1.9)

## 2019-07-25 LAB — APTT: aPTT: 151 seconds — ABNORMAL HIGH (ref 24–36)

## 2019-07-25 LAB — CBC WITH DIFFERENTIAL/PLATELET
Abs Immature Granulocytes: 0.24 10*3/uL — ABNORMAL HIGH (ref 0.00–0.07)
Basophils Absolute: 0.1 10*3/uL (ref 0.0–0.1)
Basophils Relative: 0 %
Eosinophils Absolute: 0 10*3/uL (ref 0.0–0.5)
Eosinophils Relative: 0 %
HCT: 30.1 % — ABNORMAL LOW (ref 39.0–52.0)
Hemoglobin: 9.8 g/dL — ABNORMAL LOW (ref 13.0–17.0)
Immature Granulocytes: 1 %
Lymphocytes Relative: 2 %
Lymphs Abs: 0.4 10*3/uL — ABNORMAL LOW (ref 0.7–4.0)
MCH: 30.8 pg (ref 26.0–34.0)
MCHC: 32.6 g/dL (ref 30.0–36.0)
MCV: 94.7 fL (ref 80.0–100.0)
Monocytes Absolute: 1.3 10*3/uL — ABNORMAL HIGH (ref 0.1–1.0)
Monocytes Relative: 7 %
Neutro Abs: 16.9 10*3/uL — ABNORMAL HIGH (ref 1.7–7.7)
Neutrophils Relative %: 90 %
Platelets: 200 10*3/uL (ref 150–400)
RBC: 3.18 MIL/uL — ABNORMAL LOW (ref 4.22–5.81)
RDW: 13.6 % (ref 11.5–15.5)
WBC: 19 10*3/uL — ABNORMAL HIGH (ref 4.0–10.5)
nRBC: 0 % (ref 0.0–0.2)

## 2019-07-25 LAB — PROTIME-INR
INR: 2.2 — ABNORMAL HIGH (ref 0.8–1.2)
Prothrombin Time: 24.6 seconds — ABNORMAL HIGH (ref 11.4–15.2)

## 2019-07-25 LAB — FIBRIN DERIVATIVES D-DIMER (ARMC ONLY): Fibrin derivatives D-dimer (ARMC): 3975.72 ng/mL (FEU) — ABNORMAL HIGH (ref 0.00–499.00)

## 2019-07-25 LAB — C-REACTIVE PROTEIN: CRP: 21.2 mg/dL — ABNORMAL HIGH (ref <1.0)

## 2019-07-25 LAB — FERRITIN: Ferritin: 36 ng/mL (ref 24–336)

## 2019-07-25 LAB — GLUCOSE, CAPILLARY: Glucose-Capillary: 79 mg/dL (ref 70–99)

## 2019-07-25 LAB — BRAIN NATRIURETIC PEPTIDE: B Natriuretic Peptide: 946 pg/mL — ABNORMAL HIGH (ref 0.0–100.0)

## 2019-07-25 LAB — HEPARIN LEVEL (UNFRACTIONATED): Heparin Unfractionated: >3.6 IU/mL — ABNORMAL HIGH (ref 0.30–0.70)

## 2019-07-25 MED ORDER — FUROSEMIDE 10 MG/ML IJ SOLN
40.0000 mg | Freq: Once | INTRAMUSCULAR | Status: AC
  Administered 2019-07-25: 40 mg via INTRAVENOUS
  Filled 2019-07-25: qty 4

## 2019-07-25 MED ORDER — INSULIN ASPART 100 UNIT/ML ~~LOC~~ SOLN
0.0000 [IU] | Freq: Three times a day (TID) | SUBCUTANEOUS | Status: DC
  Administered 2019-07-27: 09:00:00 2 [IU] via SUBCUTANEOUS
  Administered 2019-07-27 – 2019-07-28 (×4): 1 [IU] via SUBCUTANEOUS
  Administered 2019-07-29 (×2): 2 [IU] via SUBCUTANEOUS
  Administered 2019-07-29: 11:00:00 1 [IU] via SUBCUTANEOUS
  Administered 2019-07-30: 18:00:00 5 [IU] via SUBCUTANEOUS
  Administered 2019-07-30 (×2): 2 [IU] via SUBCUTANEOUS
  Filled 2019-07-25 (×10): qty 1

## 2019-07-25 MED ORDER — SODIUM CHLORIDE 0.9% FLUSH
3.0000 mL | INTRAVENOUS | Status: DC | PRN
  Administered 2019-08-06 – 2019-08-08 (×2): 3 mL via INTRAVENOUS

## 2019-07-25 MED ORDER — ACETAMINOPHEN 325 MG PO TABS
650.0000 mg | ORAL_TABLET | Freq: Four times a day (QID) | ORAL | Status: DC | PRN
  Administered 2019-08-02 – 2019-08-10 (×3): 650 mg via ORAL
  Filled 2019-07-25 (×3): qty 2

## 2019-07-25 MED ORDER — OCUVITE-LUTEIN PO TABS: 1.0000 | ORAL_TABLET | Freq: Every day | ORAL | Status: DC

## 2019-07-25 MED ORDER — APIXABAN 2.5 MG PO TABS: 2.5000 mg | ORAL_TABLET | Freq: Two times a day (BID) | ORAL | Status: DC

## 2019-07-25 MED ORDER — HEPARIN (PORCINE) 25000 UT/250ML-% IV SOLN
1400.0000 [IU]/h | INTRAVENOUS | Status: DC
  Administered 2019-07-25: 1200 [IU]/h via INTRAVENOUS
  Administered 2019-07-26: 14:00:00 1400 [IU]/h via INTRAVENOUS
  Filled 2019-07-25 (×2): qty 250

## 2019-07-25 MED ORDER — SODIUM CHLORIDE 0.9% FLUSH
3.0000 mL | Freq: Two times a day (BID) | INTRAVENOUS | Status: DC
  Administered 2019-07-26 – 2019-08-11 (×32): 3 mL via INTRAVENOUS

## 2019-07-25 MED ORDER — SODIUM CHLORIDE 0.9 % IV SOLN
250.0000 mL | INTRAVENOUS | Status: DC | PRN
  Administered 2019-08-03: 250 mL via INTRAVENOUS

## 2019-07-25 MED ORDER — LORATADINE 10 MG PO TABS
10.0000 mg | ORAL_TABLET | Freq: Every day | ORAL | Status: DC
  Administered 2019-07-26 – 2019-08-11 (×17): 10 mg via ORAL
  Filled 2019-07-25 (×17): qty 1

## 2019-07-25 MED ORDER — FELODIPINE ER 5 MG PO TB24
7.5000 mg | ORAL_TABLET | Freq: Every day | ORAL | Status: DC
  Administered 2019-07-26: 10:00:00 7.5 mg via ORAL
  Filled 2019-07-25: qty 1

## 2019-07-25 MED ORDER — METOPROLOL TARTRATE 25 MG PO TABS: 25.0000 mg | ORAL_TABLET | Freq: Two times a day (BID) | ORAL | Status: DC

## 2019-07-25 MED ORDER — DEXAMETHASONE 4 MG PO TABS
6.0000 mg | ORAL_TABLET | Freq: Every day | ORAL | Status: DC
  Administered 2019-07-25 – 2019-08-02 (×9): 6 mg via ORAL
  Filled 2019-07-25 (×2): qty 2
  Filled 2019-07-25: qty 1.5
  Filled 2019-07-25 (×4): qty 2
  Filled 2019-07-25: qty 1.5
  Filled 2019-07-25 (×2): qty 2

## 2019-07-25 MED ORDER — DOCUSATE SODIUM 100 MG PO CAPS
100.0000 mg | ORAL_CAPSULE | Freq: Every day | ORAL | Status: DC
  Administered 2019-07-26 – 2019-08-11 (×15): 100 mg via ORAL
  Filled 2019-07-25 (×17): qty 1

## 2019-07-25 MED ORDER — FERROUS SULFATE 325 (65 FE) MG PO TABS
325.0000 mg | ORAL_TABLET | Freq: Every day | ORAL | Status: DC
  Administered 2019-07-26 – 2019-08-12 (×18): 325 mg via ORAL
  Filled 2019-07-25 (×20): qty 1

## 2019-07-25 MED ORDER — HYDRALAZINE HCL 50 MG PO TABS
25.0000 mg | ORAL_TABLET | Freq: Three times a day (TID) | ORAL | Status: DC
  Administered 2019-07-26 – 2019-07-27 (×4): 25 mg via ORAL
  Filled 2019-07-25 (×4): qty 1

## 2019-07-25 MED ORDER — ACETAMINOPHEN 325 MG PO TABS: 650.0000 mg | ORAL_TABLET | ORAL | Status: DC | PRN

## 2019-07-25 MED ORDER — FUROSEMIDE 10 MG/ML IJ SOLN
80.0000 mg | Freq: Two times a day (BID) | INTRAMUSCULAR | Status: DC
  Administered 2019-07-25 – 2019-07-26 (×2): 80 mg via INTRAVENOUS
  Filled 2019-07-25 (×3): qty 8

## 2019-07-25 MED ORDER — ASPIRIN EC 81 MG PO TBEC
81.0000 mg | DELAYED_RELEASE_TABLET | Freq: Every day | ORAL | Status: DC
  Administered 2019-07-25 – 2019-08-01 (×8): 81 mg via ORAL
  Filled 2019-07-25 (×8): qty 1

## 2019-07-25 MED ORDER — ONDANSETRON HCL 4 MG/2ML IJ SOLN: 4.0000 mg | Freq: Four times a day (QID) | INTRAMUSCULAR | Status: DC | PRN

## 2019-07-25 MED ORDER — ALBUTEROL SULFATE HFA 108 (90 BASE) MCG/ACT IN AERS
2.0000 | INHALATION_SPRAY | Freq: Once | RESPIRATORY_TRACT | Status: AC
  Administered 2019-07-25: 2 via RESPIRATORY_TRACT
  Filled 2019-07-25: qty 6.7

## 2019-07-25 MED ORDER — VITAMIN D (ERGOCALCIFEROL) 1.25 MG (50000 UNIT) PO CAPS
50000.0000 [IU] | ORAL_CAPSULE | ORAL | Status: DC
  Administered 2019-07-31 – 2019-08-07 (×2): 50000 [IU] via ORAL
  Filled 2019-07-25 (×2): qty 1

## 2019-07-25 MED ORDER — ACETIC ACID 0.25 % IR SOLN: 1.0000 "application " | Status: DC

## 2019-07-25 MED ORDER — HEPARIN BOLUS VIA INFUSION
4000.0000 [IU] | Freq: Once | INTRAVENOUS | Status: AC
  Administered 2019-07-25: 4000 [IU] via INTRAVENOUS
  Filled 2019-07-25: qty 4000

## 2019-07-25 MED ORDER — INSULIN ASPART 100 UNIT/ML ~~LOC~~ SOLN
2.0000 [IU] | Freq: Three times a day (TID) | SUBCUTANEOUS | Status: DC
  Administered 2019-07-27 – 2019-08-01 (×17): 2 [IU] via SUBCUTANEOUS
  Filled 2019-07-25 (×16): qty 1

## 2019-07-25 MED ORDER — PRAVASTATIN SODIUM 20 MG PO TABS
20.0000 mg | ORAL_TABLET | Freq: Every day | ORAL | Status: DC
  Administered 2019-07-26 – 2019-08-11 (×17): 20 mg via ORAL
  Filled 2019-07-25 (×20): qty 1

## 2019-07-25 NOTE — H&P (Addendum)
History and Physical    Dannon Nguyenthi Funaro ZOX:[id number] DOB: Aug 18, 1930 DOA: 07/25/2019  PCP: [name], MD   Patient coming from: SNF  I have personally briefly reviewed patient's old medical records in [place]  Chief Complaint: Shortness of breath  HPI: BURLIN MCNAIR is a 86 y.o. male with medical history significant for hypertension, dyslipidemia, diabetes mellitus with complications of stage IV chronic kidney disease, history of peripheral vascular disease status post right BKA, history of atrial flutter on Eliquis and iron deficiency anemia.  He was sent to the emergency room for evaluation of shortness of breath. Patient was recently discharged from the hospital on 07/01/19 after hospitalization for CHF. He recently tested  positive for COVID 19 virus following routine testing at the SNF where he resides. Due to increased work of breathing EMS was called and he was brought to the ER. He was found to have a pulse oximetry of 90% on room air and was placed on 2L of oxygen. He has a new cough but denies having any fevers or chills. He has no chest pain.  Denies having any nausea, vomiting, abdominal pain, palpitations or diaphoresis.  He has no urinary symptoms He had a 2D echocardiogram done during his last hospitalization which showed an LVEF of 50 to 55% with left global hypokinesis.  Right ventricular systolic function is moderately reduced. Right ventricular size is moderately enlarged. There is severely elevated pulmonary artery systolic pressure.     ED Course: 86 year old male with history of CAD and CHF who presents to the ED with increased difficulty of breathing noted at nursing facility following diagnosis of COVID-19, 3 days prior.  Patient states he has been feeling short of breath for 6 months, although he was noted to have O2 sats on room air of 90%, subsequently placed on 2 L by EMS.  He is tachypneic but not in any respiratory distress here. His  troponin is elevated and patient has leukocytosis. Cardiology consult has been requested and patient startetd on a heparin drip.  Review of Systems: As per HPI otherwise 10 point review of systems negative.    Past Medical History:  Diagnosis Date  . Anemia   . CHF (congestive heart failure) ([place])   . Diabetes mellitus without complication ([place])   . Hyperlipidemia   . Hypertension   . PVD (peripheral vascular disease) ([place])   . Skin cancer     Past Surgical History:  Procedure Laterality Date  . BASAL CELL CARCINOMA EXCISION    . History of eye surgery Bilateral   . IR CATHETER TUBE CHANGE  03/01/2018  . LOWER EXTREMITY ANGIOGRAPHY Left 06/24/2019   Procedure: Lower Extremity Angiography;  Surgeon: Katha Cabal, MD;  Location: [facility];  Service: Cardiovascular;  Laterality: Left;  . MOHS SURGERY  04/2011   the top of his head  . Right BKA  09/12/2010   Dr. Delana Meyer; Secondary gangrenous right LE  . TONSILLECTOMY       reports that he has never smoked. He has never used smokeless tobacco. He reports that he does not drink alcohol or use drugs.  No Known Allergies  Family History  Problem Relation Age of Onset  . Diabetes Mother   . Arthritis Sister   . Diabetes Daughter      Prior to Admission medications   Medication Sig Start Date End Date Taking? Authorizing Provider  acetaminophen (TYLENOL) 325 MG tablet Take 2 tablets (650 mg total) by mouth  every 6 (six) hours as needed for mild pain (or Fever >/= 101). Patient taking differently: Take 650 mg by mouth every 6 (six) hours as needed for mild pain (general discomfort Max 3 grams/24hrs.).  12/09/17  Yes Gouru, Aruna, MD  acetic acid 0.25 % irrigation Irrigate with 1 application as directed See admin instructions. Use 30 ml via irrigation every evening shift for decrease bacterial load of supra pubic catheter   Yes [provider]  apixaban (ELIQUIS) 2.5 MG TABS tablet Take 1 tablet (2.5 mg  total) by mouth 2 (two) times daily. 11/04/17  Yes Henreitta Leber, MD  aspirin EC 81 MG EC tablet Take 1 tablet (81 mg total) by mouth daily. 07/02/19  Yes Sreenath, Sudheer B, MD  beta carotene w/minerals (OCUVITE) tablet Take 1 tablet by mouth daily.   Yes [provider]  bumetanide (BUMEX) 0.5 MG tablet Take 1 tablet (0.5 mg total) by mouth daily. 07/02/19  Yes Ralene Muskrat B, MD  Cholecalciferol (VITAMIN D3) 50000 units CAPS Take 50,000 Units by mouth every Sunday.    Yes [provider]  docusate sodium (COLACE) 100 MG capsule Take 100 mg by mouth daily.   Yes [provider]  felodipine (PLENDIL) 2.5 MG 24 hr tablet Take 7.5 mg by mouth daily.   Yes [provider]  ferrous sulfate 325 (65 FE) MG tablet Take 325 mg by mouth daily.    Yes [provider]  fexofenadine (ALLEGRA) 60 MG tablet Take 60 mg by mouth daily as needed for rhinitis (seasonal allergies.).    Yes [provider]  furosemide (LASIX) 40 MG tablet Take 40 mg by mouth daily.   Yes [provider]  hydrALAZINE (APRESOLINE) 25 MG tablet Take 25 mg by mouth 3 (three) times daily.    Yes [provider]  insulin aspart (NOVOLOG) 100 UNIT/ML injection Inject 0-9 Units into the skin 3 (three) times daily with meals. Patient taking differently: Inject 3-9 Units into the skin 3 (three) times daily with meals. Sliding Scale Insulin:150-200=3 units, 201-250=5 units, 251-300=7 units, 301-350=9 units 12/21/17  Yes Epifanio Lesches, MD  insulin detemir (LEVEMIR) 100 UNIT/ML injection Inject 32 Units into the skin at bedtime.   Yes [provider]  LEVEMIR FLEXTOUCH 100 UNIT/ML Pen Inject 22 Units into the skin daily.    Yes [provider]  lovastatin (MEVACOR) 40 MG tablet TAKE 1 TABLET (40 MG TOTAL) BY MOUTH DAILY. Patient taking differently: Take 40 mg by mouth daily.  05/11/17  Yes [name], MD  metoprolol tartrate (LOPRESSOR) 25 MG  tablet Take 25 mg by mouth 2 (two) times daily.   Yes [provider]    Physical Exam: Vitals:   07/25/19 0957 07/25/19 0959  BP: 128/70   Pulse: 71   Resp: (!) 28   Temp: 98.1 F (36.7 C)   TempSrc: Oral   SpO2: 96%   Weight:  98.9 kg  Height:  5\' 8"  (1.727 m)     Vitals:   07/25/19 0957 07/25/19 0959  BP: 128/70   Pulse: 71   Resp: (!) 28   Temp: 98.1 F (36.7 C)   TempSrc: Oral   SpO2: 96%   Weight:  98.9 kg  Height:  5\' 8"  (1.727 m)    Constitutional: NAD, alert and oriented x 3. Chronically ill appearing Eyes: PERRL, lids and conjunctivae normal ENMT: Mucous membranes are moist.  Neck: normal, supple, no masses, no thyromegaly Respiratory: rales at the  bases, no wheezing, no crackles. Normal respiratory effort. No accessory muscle use.  Cardiovascular: Regular rate and rhythm, no murmurs / rubs / gallops. No extremity edema. 2+ pedal pulses. No carotid bruits.  Abdomen: no tenderness, no masses palpated. No hepatosplenomegaly. Bowel sounds positive.  Musculoskeletal: no clubbing / cyanosis. Rt BKA Skin: no rashes, lesions, ulcers, Ecchymoses on forearm Neurologic: No gross focal neurologic deficit. Psychiatric: Normal mood and affect.   Labs on Admission: I have personally reviewed following labs and imaging studies  CBC: Recent Labs  Lab 07/25/19 1325  WBC 19.0*  NEUTROABS 16.9*  HGB 9.8*  HCT 30.1*  MCV 94.7  PLT 098   Basic Metabolic Panel: Recent Labs  Lab 07/25/19 1325  NA 129*  K 4.0  CL 96*  CO2 23  GLUCOSE 112*  BUN 63*  CREATININE 2.58*  CALCIUM 7.8*   GFR: Estimated Creatinine Clearance: 23.4 mL/min (A) (by C-G formula based on SCr of 2.58 mg/dL (H)). Liver Function Tests: No results for input(s): AST, ALT, ALKPHOS, BILITOT, PROT, ALBUMIN in the last 168 hours. No results for input(s): LIPASE, AMYLASE in the last 168 hours. No results for input(s): AMMONIA in the last 168 hours. Coagulation Profile: No results for  input(s): INR, PROTIME in the last 168 hours. Cardiac Enzymes: No results for input(s): CKTOTAL, CKMB, CKMBINDEX, TROPONINI in the last 168 hours. BNP (last 3 results) No results for input(s): PROBNP in the last 8760 hours. HbA1C: No results for input(s): HGBA1C in the last 72 hours. CBG: No results for input(s): GLUCAP in the last 168 hours. Lipid Profile: No results for input(s): CHOL, HDL, LDLCALC, TRIG, CHOLHDL, LDLDIRECT in the last 72 hours. Thyroid Function Tests: No results for input(s): TSH, T4TOTAL, FREET4, T3FREE, THYROIDAB in the last 72 hours. Anemia Panel: No results for input(s): VITAMINB12, FOLATE, FERRITIN, TIBC, IRON, RETICCTPCT in the last 72 hours. Urine analysis:    Component Value Date/Time   COLORURINE YELLOW 06/25/2019 1347   APPEARANCEUR TURBID (A) 06/25/2019 1347   LABSPEC 1.023 06/25/2019 1347   PHURINE 7.0 06/25/2019 1347   GLUCOSEU NEGATIVE 06/25/2019 1347   HGBUR SMALL (A) 06/25/2019 1347   BILIRUBINUR NEGATIVE 06/25/2019 1347   KETONESUR NEGATIVE 06/25/2019 1347   PROTEINUR 100 (A) 06/25/2019 1347   NITRITE NEGATIVE 06/25/2019 1347   LEUKOCYTESUR MODERATE (A) 06/25/2019 1347    Radiological Exams on Admission: DG Chest Portable 1 View  Result Date: 07/25/2019 CLINICAL DATA:  Chronic shortness of breath.  COVID positive. EXAM: PORTABLE CHEST 1 VIEW COMPARISON:  Chest x-ray dated June 27, 2019. FINDINGS: Unchanged mild cardiomegaly. Unchanged mild diffuse interstitial thickening and trace bilateral pleural effusions. No consolidation or pneumothorax. No acute osseous abnormality. IMPRESSION: 1. Unchanged mild congestive heart failure. No progressive interstitial thickening or focal opacity to suggest superimposed pneumonia. Electronically Signed   By: Titus Dubin M.D.   On: 07/25/2019 11:14    EKG: Independently reviewed. Atrial Flutter with RBBB  Assessment/Plan Principal Problem:   Acute on chronic diastolic CHF (congestive heart  failure) (HCC) Active Problems:   Anemia of chronic disease   CKD (chronic kidney disease) stage 4, GFR 15-29 ml/min (HCC)   Diabetes mellitus with nephropathy (HCC)   Atrial flutter (HCC)   PAD (peripheral artery disease) ([place])   COVID-19 virus infection   NSTEMI (non-ST elevated myocardial infarction) ([place])    Acute on chronic diastolic CHF Patient presents for evaluation of shortness of breath with mild hypoxia.  Pulse oximetry on room air was 90% and improved following  oxygen supplementation Will diurese patient with Lasix 80mg  IV q 12 Optimize blood pressure control   Elevated troponin ?? NSTEMI Patient presented for evaluation of worsening shortness of breath from his baseline His troponin is elevated Will obtain serial troponin levels Continue heparin drip Consult cardiology   Diabetes mellitus with stage 4 CKD Serum creatinine is at its baseline Maintain consistent carbohydrate diet Will consult nephrology   History of Atrial Flutter Continue Metoprolol Patient was on Apixaban which has been placed on hold since he is on a heparin drip    COVID 19 infection Patient tested positive for COVID 19 virus following routine testing at his SNF He has mild hypoxia with pulse oximetry of 90% on room air that improved following oxygen supplementation Will place patient on Decadron    Peripheral arterial disease Status post right BKA Patient has an ulcer involving the left leg Lower exttremity ultrasound abnormal toe brachial index Will request vascular surgery consult  DVT prophylaxis: Heparin Code Status: Full Family Communication: Plan of care was discussed with patient in detail. He verbalizes understanding and agrees with the plan Disposition Plan: Back to previous home environment Consults called: Cardiology, Nephrology    Collier Bullock MD [facility]     07/25/2019, 5:52 PM

## 2019-07-25 NOTE — ED Triage Notes (Signed)
Pt states he has had ongoing sob x 6 months due to "fluid in my legs."

## 2019-07-25 NOTE — Telephone Encounter (Signed)
Crystal from [employer] left a voicemail informing that she had received a notice from [facility] stating that the patient tested positive for Covid-19 and wanted to make our office aware. The patient was sent to the ED for assessment and to treat.

## 2019-07-25 NOTE — Consult Note (Signed)
ANTICOAGULATION CONSULT NOTE   Pharmacy Consult for Heparin Indication: chest pain/ACS  No Known Allergies  Patient Measurements: Height: 5\' 8"  (172.7 cm) Weight: 218 lb 0.6 oz (98.9 kg) IBW/kg (Calculated) : 68.4 Heparin Dosing Weight: 89.5 kg  Vital Signs: Temp: 98.1 F (36.7 C) (03/22 0957) Temp Source: Oral (03/22 0957) BP: 128/70 (03/22 0957) Pulse Rate: 71 (03/22 0957)  Labs: Recent Labs    07/25/19 1325  HGB 9.8*  HCT 30.1*  PLT 200  CREATININE 2.58*  TROPONINIHS 942*    Estimated Creatinine Clearance: 23.4 mL/min (A) (by C-G formula based on SCr of 2.58 mg/dL (H)).   Medical History: Past Medical History:  Diagnosis Date  . Anemia   . CHF (congestive heart failure) ([place])   . Diabetes mellitus without complication ([place])   . Hyperlipidemia   . Hypertension   . PVD (peripheral vascular disease) ([place])   . Skin cancer     Medications:  (Not in a hospital admission)  Scheduled:  Infusions:  PRN:  Anti-infectives (From admission, onward)   None      Assessment: Pharmacy consulted to start heparin for ACS. CBC stable. Takes apixaban 2.5 mg (last dose 3/22) Hx of a flutter. Will order baseline heparin level, if falsely elevated will switch to aPTT monitoring. Trop HS 942.  Goal of Therapy:  Heparin level 0.3-0.7 units/ml, once aPTT and HL correlate.  aPTT 66-102 seconds Monitor platelets by anticoagulation protocol: Yes   Plan:  Give 4000 units bolus x 1 Start heparin infusion at 1200 units/hr Check anti-Xa level in 8 hours and daily while on heparin Continue to monitor H&H and platelets  Oswald Hillock, PharmD, BCPS 07/25/2019,2:56 PM

## 2019-07-25 NOTE — ED Triage Notes (Signed)
Pt via ems from [facility]. After recent surgery on right leg (prior BKA), he tested positive on routine testing at facility; they sent him out to receive immune therapy. Pt is afebrile, is currently on 2L of O2 via [place] due to O2 sat at 90% upon ems arrival to facility. Pt has no complaints. Pt alert & oriented.

## 2019-07-25 NOTE — ED Provider Notes (Signed)
[facility] Emergency Department Provider Note   ____________________________________________   First MD Initiated Contact with Patient 07/25/19 1021     (approximate)  I have reviewed the triage vital signs and the nursing notes.   HISTORY  Chief Complaint positive covid    HPI Miguel Torres is a 86 y.o. male with past medical history of hypertension, hyperlipidemia, diabetes, CHF, CKD, and atrial fibrillation on Eliquis who presents to the ED for shortness of breath.  Patient recently tested positive for COVID-19 on routine testing at [facility], where he resides.  Due to increased work of breathing, EMS was called to bring him to the ED earlier today.  EMS found his O2 sats to be 90% on room air and he was started on 2 L nasal cannula.  He states he has been feeling short of breath for the past 6 months and has not been any worse recently, but he does endorse a new cough.  He denies any fevers or chest pain, denies any vomiting or diarrhea.  He states he has been doing well following his recent vascular surgery procedures.        Past Medical History:  Diagnosis Date  . Anemia   . CHF (congestive heart failure) ([place])   . Diabetes mellitus without complication ([place])   . Hyperlipidemia   . Hypertension   . PVD (peripheral vascular disease) ([place])   . Skin cancer     Patient Active Problem List   Diagnosis Date Noted  . COVID-19 virus infection 07/25/2019  . NSTEMI (non-ST elevated myocardial infarction) ([place]) 07/25/2019  . Acute on chronic diastolic CHF (congestive heart failure) ([place]) 06/21/2019  . HTN (hypertension) 06/21/2019  . Type II diabetes mellitus with renal manifestations ([place]) 06/21/2019  . Iron deficiency anemia 06/21/2019  . Atherosclerosis of artery of extremity with ulceration ([place])   . Venous ulcer of left lower extremity without varicose veins ([place]) 06/13/2019  . S/P BKA (below knee amputation) unilateral, right ([place])  05/18/2019  . Atherosclerosis of native arteries of the extremities with ulceration ([place]) 03/14/2019  . Resides in skilled nursing facility 02/24/2018  . Benign prostatic hyperplasia with urinary obstruction 12/23/2017  . Urinary retention 12/18/2017  . Acute diastolic (congestive) heart failure ([place]) 12/17/2017  . Goals of care, counseling/discussion   . Palliative care encounter   . Congestive heart failure (CHF) ([place]) 12/16/2017  . Acute diastolic CHF (congestive heart failure) ([place]) 12/16/2017  . Acute kidney injury ([place]) 12/07/2017  . Foot ulceration, left, with fat layer exposed ([place]) 11/23/2017  . Lymphedema 11/23/2017  . Shoulder pain, right 11/04/2017  . Pressure injury of skin 11/02/2017  . Elevated troponin 11/01/2017  . Microalbuminuria 10/15/2016  . BCC (basal cell carcinoma), face 06/24/2016  . Vitamin D deficiency 07/12/2015  . Retinopathy, diabetic, proliferative ([place]) 07/02/2015  . Bleeding duodenal ulcer 02/02/2015  . Atrial flutter ([place]) 01/19/2015  . Allergic rhinitis 01/17/2015  . Anemia of chronic disease 01/17/2015  . CKD (chronic kidney disease) stage 4, GFR 15-29 ml/min (H[facility]) 01/17/2015  . Diabetes mellitus with nephropathy ([place]) 01/17/2015  . Edema 01/17/2015  . Gout 01/17/2015  . Amputation of right lower extremity below knee upon examination ([place]) 01/17/2015  . HLD (hyperlipidemia) 01/17/2015  . Psoriasis 01/17/2015  . CA of skin 01/17/2015  . Acquired absence of right leg below knee ([place]) 01/17/2015  . Hypertension 10/27/2014  . Mild cognitive disorder 12/05/2013  . Postop check 02/10/2013  . Cancer of skin, squamous cell 01/21/2013  .  Squamous cell carcinoma of scalp and skin of neck 03/03/2011    Past Surgical History:  Procedure Laterality Date  . BASAL CELL CARCINOMA EXCISION    . History of eye surgery Bilateral   . IR CATHETER TUBE CHANGE  03/01/2018  . LOWER EXTREMITY ANGIOGRAPHY Left 06/24/2019   Procedure: Lower Extremity Angiography;   Surgeon: Katha Cabal, MD;  Location: [facility];  Service: Cardiovascular;  Laterality: Left;  . MOHS SURGERY  04/2011   the top of his head  . Right BKA  09/12/2010   Dr. Delana Meyer; Secondary gangrenous right LE  . TONSILLECTOMY      Prior to Admission medications   Medication Sig Start Date End Date Taking? Authorizing Provider  acetaminophen (TYLENOL) 325 MG tablet Take 2 tablets (650 mg total) by mouth every 6 (six) hours as needed for mild pain (or Fever >/= 101). Patient taking differently: Take 650 mg by mouth every 6 (six) hours as needed for mild pain (general discomfort Max 3 grams/24hrs.).  12/09/17  Yes Gouru, Aruna, MD  acetic acid 0.25 % irrigation Irrigate with 1 application as directed See admin instructions. Use 30 ml via irrigation every evening shift for decrease bacterial load of supra pubic catheter   Yes [provider]  apixaban (ELIQUIS) 2.5 MG TABS tablet Take 1 tablet (2.5 mg total) by mouth 2 (two) times daily. 11/04/17  Yes Henreitta Leber, MD  aspirin EC 81 MG EC tablet Take 1 tablet (81 mg total) by mouth daily. 07/02/19  Yes Sreenath, Sudheer B, MD  beta carotene w/minerals (OCUVITE) tablet Take 1 tablet by mouth daily.   Yes [provider]  bumetanide (BUMEX) 0.5 MG tablet Take 1 tablet (0.5 mg total) by mouth daily. 07/02/19  Yes Ralene Muskrat B, MD  Cholecalciferol (VITAMIN D3) 50000 units CAPS Take 50,000 Units by mouth every Sunday.    Yes [provider]  docusate sodium (COLACE) 100 MG capsule Take 100 mg by mouth daily.   Yes [provider]  felodipine (PLENDIL) 2.5 MG 24 hr tablet Take 7.5 mg by mouth daily.   Yes [provider]  ferrous sulfate 325 (65 FE) MG tablet Take 325 mg by mouth daily.    Yes [provider]  fexofenadine (ALLEGRA) 60 MG tablet Take 60 mg by mouth daily as needed for rhinitis (seasonal allergies.).    Yes [provider]  furosemide (LASIX) 40 MG  tablet Take 40 mg by mouth daily.   Yes [provider]  hydrALAZINE (APRESOLINE) 25 MG tablet Take 25 mg by mouth 3 (three) times daily.    Yes [provider]  insulin aspart (NOVOLOG) 100 UNIT/ML injection Inject 0-9 Units into the skin 3 (three) times daily with meals. Patient taking differently: Inject 3-9 Units into the skin 3 (three) times daily with meals. Sliding Scale Insulin:150-200=3 units, 201-250=5 units, 251-300=7 units, 301-350=9 units 12/21/17  Yes Epifanio Lesches, MD  insulin detemir (LEVEMIR) 100 UNIT/ML injection Inject 32 Units into the skin at bedtime.   Yes [provider]  LEVEMIR FLEXTOUCH 100 UNIT/ML Pen Inject 22 Units into the skin daily.    Yes [provider]  lovastatin (MEVACOR) 40 MG tablet TAKE 1 TABLET (40 MG TOTAL) BY MOUTH DAILY. Patient taking differently: Take 40 mg by mouth daily.  05/11/17  Yes [name], MD  metoprolol tartrate (LOPRESSOR) 25 MG tablet Take 25 mg by mouth 2 (two) times daily.   Yes [provider]  Allergies Patient has no known allergies.  Family History  Problem Relation Age of Onset  . Diabetes Mother   . Arthritis Sister   . Diabetes Daughter     Social History Social History   Tobacco Use  . Smoking status: Never Smoker  . Smokeless tobacco: Never Used  Substance Use Topics  . Alcohol use: No  . Drug use: No    Review of Systems  Constitutional: No fever/chills Eyes: No visual changes. ENT: No sore throat. Cardiovascular: Denies chest pain. Respiratory: Positive for cough and shortness of breath. Gastrointestinal: No abdominal pain.  No nausea, no vomiting.  No diarrhea.  No constipation. Genitourinary: Negative for dysuria. Musculoskeletal: Negative for back pain. Skin: Negative for rash. Neurological: Negative for headaches, focal weakness or numbness.  ____________________________________________   PHYSICAL EXAM:  VITAL SIGNS: ED Triage Vitals    Enc Vitals Group     BP 07/25/19 0957 128/70     Pulse Rate 07/25/19 0957 71     Resp 07/25/19 0957 (!) 28     Temp 07/25/19 0957 98.1 F (36.7 C)     Temp Source 07/25/19 0957 Oral     SpO2 07/25/19 0957 96 %     Weight 07/25/19 0959 218 lb 0.6 oz (98.9 kg)     Height 07/25/19 0959 5\' 8"  (1.727 m)     Head Circumference --      Peak Flow --      Pain Score 07/25/19 0959 0     Pain Loc --      Pain Edu? --      Excl. in [place]? --     Constitutional: Alert and oriented. Eyes: Conjunctivae are normal. Head: Atraumatic. Nose: No congestion/rhinnorhea. Mouth/Throat: Mucous membranes are moist. Neck: Normal ROM Cardiovascular: Normal rate, regular rhythm. Grossly normal heart sounds. Respiratory: Tachypneic with normal respiratory effort.  No retractions. Lungs CTAB. Gastrointestinal: Soft and nontender. No distention. Genitourinary: deferred Musculoskeletal: No lower extremity tenderness.  Status post right BKA, dressing in place to left lower extremity.  2+ pitting edema to mid thighs bilaterally. Neurologic:  Normal speech and language. No gross focal neurologic deficits are appreciated. Skin:  Skin is warm, dry and intact. No rash noted. Psychiatric: Mood and affect are normal. Speech and behavior are normal.  ____________________________________________   LABS (all labs ordered are listed, but only abnormal results are displayed)  Labs Reviewed  BASIC METABOLIC PANEL - Abnormal; Notable for the following components:      Result Value   Sodium 129 (*)    Chloride 96 (*)    Glucose, Bld 112 (*)    BUN 63 (*)    Creatinine, Ser 2.58 (*)    Calcium 7.8 (*)    GFR calc non Af Amer 22 (*)    GFR calc Af Amer 25 (*)    All other components within normal limits  CBC WITH DIFFERENTIAL/PLATELET - Abnormal; Notable for the following components:   WBC 19.0 (*)    RBC 3.18 (*)    Hemoglobin 9.8 (*)    HCT 30.1 (*)    Neutro Abs 16.9 (*)    Lymphs Abs 0.4 (*)    Monocytes  Absolute 1.3 (*)    Abs Immature Granulocytes 0.24 (*)    All other components within normal limits  BRAIN NATRIURETIC PEPTIDE - Abnormal; Notable for the following components:   B Natriuretic Peptide 946.0 (*)    All other components within normal limits  TROPONIN I (HIGH SENSITIVITY) -  Abnormal; Notable for the following components:   Troponin I (High Sensitivity) 942 (*)    All other components within normal limits  CULTURE, BLOOD (ROUTINE X 2)  CULTURE, BLOOD (ROUTINE X 2)  LACTIC ACID, PLASMA  LACTIC ACID, PLASMA  URINALYSIS, COMPLETE (UACMP) WITH MICROSCOPIC  C-REACTIVE PROTEIN  APTT  HEPARIN LEVEL (UNFRACTIONATED)  CBC  FIBRIN DERIVATIVES D-DIMER ([facility] ONLY)  FERRITIN  HEPARIN LEVEL (UNFRACTIONATED)  PROTIME-INR  APTT  TROPONIN I (HIGH SENSITIVITY)   ____________________________________________  EKG  ED ECG REPORT I, Blake Divine, the attending physician, personally viewed and interpreted this ECG.   Date: 07/25/2019  EKG Time: 9:57  Rate: 74  Rhythm: normal sinus rhythm  Axis: LAD  Intervals:right bundle branch block and left anterior fascicular block  ST&T Change: None   PROCEDURES  Procedure(s) performed (including Critical Care):  Procedures   ____________________________________________   INITIAL IMPRESSION / ASSESSMENT AND PLAN / ED COURSE       86 year old male with history of CAD and CHF presents to the ED with increased difficulty breathing noted at nursing facility following diagnosis of COVID-19 3 days prior.  Patient states he has been feeling short of breath for 6 months, although he was noted to have O2 sats on room air of 90%, subsequently placed on 2 L by EMS.  He is tachypneic but not in any respiratory distress here, we will trial off supplemental oxygen and observe his O2 saturations.  Plan to check labs and chest x-ray, EKG without evidence of acute ischemia.  Chest x-ray shows mild pulmonary edema but no evidence of focal  pneumonia.  Patient noted to have elevated white count but there is no obvious source of infection and we will hold off on antibiotics.  He continues to maintain O2 sats on room air and is not in any respiratory distress.  Troponin noted to be elevated to greater than 900, but patient continues to deny any chest pain.  Case discussed with Dr. Nehemiah Massed of cardiology, who agrees with plan for starting heparin drip, but elevated troponin seems most likely related to patient's COVID-19 diagnosis.  We will admit for further management.      ____________________________________________   FINAL CLINICAL IMPRESSION(S) / ED DIAGNOSES  Final diagnoses:  COVID-19 virus infection  Elevated troponin  Acute on chronic diastolic CHF (congestive heart failure) [facility])     ED Discharge Orders    None       Note:  This document was prepared using Dragon voice recognition software and may include unintentional dictation errors.   Blake Divine, MD 07/25/19 [phone]

## 2019-07-25 NOTE — Telephone Encounter (Signed)
Called to Discuss with patient about Covid symptoms and the use of bamlanivimab, a monoclonal antibody infusion for those with mild to moderate Covid symptoms and at a high risk of hospitalization.     Pt is qualified for this infusion at the [facility] infusion center due to co-morbid conditions and/or a member of an at-risk group.     Spoke to patient's nurse at [facility] where he resides. Patient has increased exertional dyspnea with destauration but has been declining supplemental oxygen. Patient has history of CHF and nursing was unaware of positive covid test. Based on patient's age and comorbidities along with symptoms described I recommended evaluation in ER. May consider MAB treatment if patient qualifies. I will update his daughter with my recommendation as well.   Beckey Rutter, [place], AGNP-C [phone] ([place])

## 2019-07-26 ENCOUNTER — Ambulatory Visit
Admission: RE | Admit: 2019-07-26 | Payer: Medicare (Managed Care) | Source: Ambulatory Visit | Admitting: Vascular Surgery

## 2019-07-26 DIAGNOSIS — U071 COVID-19: Principal | ICD-10-CM

## 2019-07-26 DIAGNOSIS — I5033 Acute on chronic diastolic (congestive) heart failure: Secondary | ICD-10-CM

## 2019-07-26 DIAGNOSIS — N184 Chronic kidney disease, stage 4 (severe): Secondary | ICD-10-CM

## 2019-07-26 DIAGNOSIS — S88111A Complete traumatic amputation at level between knee and ankle, right lower leg, initial encounter: Secondary | ICD-10-CM

## 2019-07-26 DIAGNOSIS — R778 Other specified abnormalities of plasma proteins: Secondary | ICD-10-CM

## 2019-07-26 DIAGNOSIS — I7025 Atherosclerosis of native arteries of other extremities with ulceration: Secondary | ICD-10-CM

## 2019-07-26 LAB — CBC
HCT: 30.5 % — ABNORMAL LOW (ref 39.0–52.0)
Hemoglobin: 9.9 g/dL — ABNORMAL LOW (ref 13.0–17.0)
MCH: 30.7 pg (ref 26.0–34.0)
MCHC: 32.5 g/dL (ref 30.0–36.0)
MCV: 94.4 fL (ref 80.0–100.0)
Platelets: 200 10*3/uL (ref 150–400)
RBC: 3.23 MIL/uL — ABNORMAL LOW (ref 4.22–5.81)
RDW: 13.7 % (ref 11.5–15.5)
WBC: 14.7 10*3/uL — ABNORMAL HIGH (ref 4.0–10.5)
nRBC: 0 % (ref 0.0–0.2)

## 2019-07-26 LAB — C-REACTIVE PROTEIN: CRP: 20.5 mg/dL — ABNORMAL HIGH (ref <1.0)

## 2019-07-26 LAB — APTT
aPTT: 91 seconds — ABNORMAL HIGH (ref 24–36)
aPTT: 40 seconds — ABNORMAL HIGH (ref 24–36)
aPTT: 44 seconds — ABNORMAL HIGH (ref 24–36)

## 2019-07-26 LAB — ALBUMIN: Albumin: 2.4 g/dL — ABNORMAL LOW (ref 3.5–5.0)

## 2019-07-26 LAB — TROPONIN I (HIGH SENSITIVITY)
Troponin I (High Sensitivity): 619 ng/L (ref <18)
Troponin I (High Sensitivity): 555 ng/L (ref <18)

## 2019-07-26 LAB — HEPARIN LEVEL (UNFRACTIONATED)
Heparin Unfractionated: 3.38 IU/mL — ABNORMAL HIGH (ref 0.30–0.70)
Heparin Unfractionated: 2.66 IU/mL — ABNORMAL HIGH (ref 0.30–0.70)

## 2019-07-26 LAB — BASIC METABOLIC PANEL
Anion gap: 9 (ref 5–15)
BUN: 64 mg/dL — ABNORMAL HIGH (ref 8–23)
CO2: 24 mmol/L (ref 22–32)
Calcium: 7.9 mg/dL — ABNORMAL LOW (ref 8.9–10.3)
Chloride: 99 mmol/L (ref 98–111)
Creatinine, Ser: 2.33 mg/dL — ABNORMAL HIGH (ref 0.61–1.24)
GFR calc Af Amer: 28 mL/min — ABNORMAL LOW (ref >60)
GFR calc non Af Amer: 24 mL/min — ABNORMAL LOW (ref >60)
Glucose, Bld: 85 mg/dL (ref 70–99)
Potassium: 4.1 mmol/L (ref 3.5–5.1)
Sodium: 132 mmol/L — ABNORMAL LOW (ref 135–145)

## 2019-07-26 LAB — GLUCOSE, CAPILLARY
Glucose-Capillary: 112 mg/dL — ABNORMAL HIGH (ref 70–99)
Glucose-Capillary: 94 mg/dL (ref 70–99)
Glucose-Capillary: 86 mg/dL (ref 70–99)
Glucose-Capillary: 158 mg/dL — ABNORMAL HIGH (ref 70–99)
Glucose-Capillary: 196 mg/dL — ABNORMAL HIGH (ref 70–99)

## 2019-07-26 LAB — ABO/RH: ABO/RH(D): A POS

## 2019-07-26 LAB — PROCALCITONIN: Procalcitonin: 0.55 ng/mL

## 2019-07-26 LAB — FIBRIN DERIVATIVES D-DIMER (ARMC ONLY): Fibrin derivatives D-dimer (ARMC): 4617.95 ng/mL (FEU) — ABNORMAL HIGH (ref 0.00–499.00)

## 2019-07-26 SURGERY — LOWER EXTREMITY ANGIOGRAPHY
Anesthesia: Moderate Sedation | Laterality: Left

## 2019-07-26 MED ORDER — HYDROCOD POLST-CPM POLST ER 10-8 MG/5ML PO SUER
5.0000 mL | Freq: Two times a day (BID) | ORAL | Status: DC | PRN
  Administered 2019-08-02: 5 mL via ORAL
  Filled 2019-07-26: qty 5

## 2019-07-26 MED ORDER — FUROSEMIDE 10 MG/ML IJ SOLN
4.0000 mg/h | INTRAVENOUS | Status: DC
  Administered 2019-07-26 – 2019-07-31 (×2): 4 mg/h via INTRAVENOUS
  Filled 2019-07-26 (×3): qty 25

## 2019-07-26 MED ORDER — IPRATROPIUM-ALBUTEROL 20-100 MCG/ACT IN AERS
1.0000 | INHALATION_SPRAY | Freq: Four times a day (QID) | RESPIRATORY_TRACT | Status: DC
  Administered 2019-07-26 – 2019-08-12 (×59): 1 via RESPIRATORY_TRACT
  Filled 2019-07-26: qty 4

## 2019-07-26 MED ORDER — ZINC SULFATE 220 (50 ZN) MG PO CAPS
220.0000 mg | ORAL_CAPSULE | Freq: Every day | ORAL | Status: DC
  Administered 2019-07-26 – 2019-08-11 (×17): 220 mg via ORAL
  Filled 2019-07-26 (×17): qty 1

## 2019-07-26 MED ORDER — ALBUMIN HUMAN 25 % IV SOLN
12.5000 g | Freq: Every day | INTRAVENOUS | Status: DC
  Administered 2019-07-26: 12.5 g via INTRAVENOUS
  Filled 2019-07-26 (×2): qty 50

## 2019-07-26 MED ORDER — SODIUM CHLORIDE 0.9 % IV SOLN
100.0000 mg | Freq: Every day | INTRAVENOUS | Status: AC
  Administered 2019-07-27 – 2019-07-30 (×4): 100 mg via INTRAVENOUS
  Filled 2019-07-26: qty 100
  Filled 2019-07-26 (×3): qty 20

## 2019-07-26 MED ORDER — ASCORBIC ACID 500 MG PO TABS
500.0000 mg | ORAL_TABLET | Freq: Every day | ORAL | Status: DC
  Administered 2019-07-26 – 2019-08-11 (×17): 500 mg via ORAL
  Filled 2019-07-26 (×17): qty 1

## 2019-07-26 MED ORDER — SODIUM CHLORIDE 0.9 % IV SOLN
200.0000 mg | Freq: Once | INTRAVENOUS | Status: AC
  Administered 2019-07-26: 17:00:00 200 mg via INTRAVENOUS
  Filled 2019-07-26: qty 40

## 2019-07-26 MED ORDER — GUAIFENESIN-DM 100-10 MG/5ML PO SYRP: 10.0000 mL | ORAL_SOLUTION | ORAL | Status: DC | PRN

## 2019-07-26 MED ORDER — APIXABAN 2.5 MG PO TABS
2.5000 mg | ORAL_TABLET | Freq: Two times a day (BID) | ORAL | Status: DC
  Administered 2019-07-26 – 2019-07-31 (×11): 2.5 mg via ORAL
  Filled 2019-07-26 (×12): qty 1

## 2019-07-26 NOTE — Consult Note (Signed)
Remdesivir - Pharmacy Brief Note   Remdesivir 200 mg IVPB once followed by 100 mg IVPB daily x 4 days.   Miguel Torres 07/26/2019 4:12 PM

## 2019-07-26 NOTE — Progress Notes (Addendum)
PROGRESS NOTE    Miguel Torres  YHC:[id number] DOB: Aug 13, 1930 DOA: 07/25/2019 PCP: [name], MD   Brief Narrative:  Miguel Torres is a 86 y.o. male with medical history significant for hypertension, dyslipidemia, diabetes mellitus with complications of stage IV chronic kidney disease, history of peripheral vascular disease status post right BKA, history of atrial flutter on Eliquis and iron deficiency anemia.  He was sent to the emergency room for evaluation of shortness of breath. Patient was recently discharged from the hospital on 07/01/19 after hospitalization for CHF. He recently tested  positive for COVID 19 virus following routine testing at the SNF where he resides. Due to increased work of breathing EMS was called and he was brought to the ER. He was found to have a pulse oximetry of 90% on room air and was placed on 2L of oxygen. He has a new cough but denies having any fevers or chills.  Subjective: Patient was sitting in his bed comfortably when seen today.  He is very hard of hearing.  No new complaints.  Assessment & Plan:   Principal Problem:   Acute on chronic diastolic CHF (congestive heart failure) (HCC) Active Problems:   Anemia of chronic disease   CKD (chronic kidney disease) stage 4, GFR 15-29 ml/min (HCC)   Diabetes mellitus with nephropathy (HCC)   Atrial flutter (HCC)   PAD (peripheral artery disease) ([place])   COVID-19 virus infection   NSTEMI (non-ST elevated myocardial infarction) (HCC)  Acute on chronic diastolic heart failure.  Patient appears volume overload. Hypoxia which improves with supplemental oxygen.  He was initially diuresed with IV Lasix with urinary output of 800 mL. Nephrology started him on Lasix infusion. Cardiology was consulted-they do not recommend any further work-up. -Continue diuresis with Lasix gtt. -Monitor BMP. -Strict intake and output. -Daily weight. -Palliative care consult as patient is high risk for  deterioration due to multiple comorbidities and advanced age.  COVID-19 infection.  Patient with mild hypoxia.  Chest x-ray with mild congestive heart failure no infiltrate.  Markedly elevated inflammatory markers.  He was started on Decadron.  Negative blood cultures.  UA looked infected. Remained afebrile with positive leukocytosis. -Check urine culture. -Check PCT -Start him on remdesivir-1/5 -Continue Decadron for 10 days.2/10. -Continue to monitor inflammatory markers. -Oxygen supplement as needed to keep saturation above 90%.  Elevated troponin.  Most likely secondary to demand.  Denies any chest pain. Trending down.  He was started on heparin infusion. -Discontinue heparin infusion. -Continue to monitor.  Diabetes mellitus with stage 4 CKD. -Continue with SSI and sensitive mealtime coverage. -Might need increase in insulin as patient is on steroid now.  AKI with CKD stage IV.  Creatinine elevated at 2.58 on admission which improved to 2.33 today.  Baseline of 1.7-1.8. -Nephrology was consulted and they are recommending Lasix infusion and IV albumin. -Continue to monitor. -Avoid nephrotoxins  History of Atrial Flutter.  Currently rate controlled. His home dose of Eliquis was placed on hold as he was started on heparin infusion. -Restart home dose of apixaban.  Peripheral arterial disease.  S/p right BKA Patient has an ulcer on left leg.  Lower extremity ultrasound with abnormal toe brachial index. -Vascular surgery was consulted-appreciate their recommendations. -Wound care consult.  Objective: Vitals:   07/25/19 2107 07/25/19 2230 07/26/19 0500 07/26/19 0751  BP: 117/77 105/68  (!) 115/49  Pulse: 88 87  79  Resp: (!) 23 (!) 27  18  Temp: 97.6 F (36.4 C)  97.8 F (36.6 C)  TempSrc: Oral   Axillary  SpO2: 99% 98%  96%  Weight:   126.5 kg   Height:        Intake/Output Summary (Last 24 hours) at 07/26/2019 1547 Last data filed at 07/26/2019 0500 Gross per 24  hour  Intake 170.94 ml  Output 800 ml  Net -629.06 ml   Filed Weights   07/25/19 0959 07/26/19 0500  Weight: 98.9 kg 126.5 kg    Examination:  General exam: Chronically ill-appearing elderly man, appears calm and comfortable  Respiratory system: Clear to auscultation with decreased breath sounds at bases, Respiratory effort normal. Cardiovascular system: S1 & S2 heard, RRR. No JVD, murmurs, rubs, gallops or clicks. Gastrointestinal system: Soft, nontender, nondistended, bowel sounds positive. Central nervous system: Alert and oriented. No focal neurological deficits. Extremities: Right BKA, left lower extremity with Unna boot. Skin: Multiple skin lesions all over. Psychiatry: Judgement and insight appear normal.   DVT prophylaxis: Eliquis Code Status: Full Family Communication: Son was updated on phone Disposition Plan: Pending improvement.  Currently patient is on remdesivir and Lasix infusion.  High risk for deterioration due to multiple comorbidities.  Palliative care was consulted.  Consultants:   Cardiology  Nephrology  Palliative care  Vascular surgery  Procedures:  Antimicrobials:   Data Reviewed: I have personally reviewed following labs and imaging studies  CBC: Recent Labs  Lab 07/25/19 1325 07/26/19 0103  WBC 19.0* 14.7*  NEUTROABS 16.9*  --   HGB 9.8* 9.9*  HCT 30.1* 30.5*  MCV 94.7 94.4  PLT 200 361   Basic Metabolic Panel: Recent Labs  Lab 07/25/19 1325 07/26/19 0103  NA 129* 132*  K 4.0 4.1  CL 96* 99  CO2 23 24  GLUCOSE 112* 85  BUN 63* 64*  CREATININE 2.58* 2.33*  CALCIUM 7.8* 7.9*   GFR: Estimated Creatinine Clearance: 29.5 mL/min (A) (by C-G formula based on SCr of 2.33 mg/dL (H)). Liver Function Tests: Recent Labs  Lab 07/26/19 0827  ALBUMIN 2.4*   No results for input(s): LIPASE, AMYLASE in the last 168 hours. No results for input(s): AMMONIA in the last 168 hours. Coagulation Profile: Recent Labs  Lab 07/25/19 1707    INR 2.2*   Cardiac Enzymes: No results for input(s): CKTOTAL, CKMB, CKMBINDEX, TROPONINI in the last 168 hours. BNP (last 3 results) No results for input(s): PROBNP in the last 8760 hours. HbA1C: No results for input(s): HGBA1C in the last 72 hours. CBG: Recent Labs  Lab 07/25/19 1856 07/26/19 0107 07/26/19 0448 07/26/19 0748 07/26/19 1140  GLUCAP 79 112* 94 86 158*   Lipid Profile: No results for input(s): CHOL, HDL, LDLCALC, TRIG, CHOLHDL, LDLDIRECT in the last 72 hours. Thyroid Function Tests: No results for input(s): TSH, T4TOTAL, FREET4, T3FREE, THYROIDAB in the last 72 hours. Anemia Panel: Recent Labs    07/25/19 1707  FERRITIN 36   Sepsis Labs: Recent Labs  Lab 07/25/19 1328  LATICACIDVEN 1.0    Recent Results (from the past 240 hour(s))  SARS CORONAVIRUS 2 (TAT 6-24 HRS) Nasopharyngeal Nasopharyngeal Swab     Status: Abnormal   Collection Time: 07/22/19  1:05 PM   Specimen: Nasopharyngeal Swab  Result Value Ref Range Status   SARS Coronavirus 2 POSITIVE (A) NEGATIVE Final    Comment: (NOTE) SARS-CoV-2 target nucleic acids are DETECTED. The SARS-CoV-2 RNA is generally detectable in upper and lower respiratory specimens during the acute phase of infection. Positive results are indicative of the presence of SARS-CoV-2 RNA.  Clinical correlation with patient history and other diagnostic information is  necessary to determine patient infection status. Positive results do not rule out bacterial infection or co-infection with other viruses.  The expected result is Negative. Fact Sheet for Patients: SugarRoll.be Fact Sheet for Healthcare Providers: https://www.woods-mathews.com/ This test is not yet approved or cleared by the [place] FDA and  has been authorized for detection and/or diagnosis of SARS-CoV-2 by FDA under an Emergency Use Authorization (EUA). This EUA will remain  in effect (meaning this test can be  used) for the duration of the COVID-19 declaration under Section 564(b)(1) of the Act, 21 U.S.C. se ction 360bbb-3(b)(1), unless the authorization is terminated or revoked sooner. Performed at [facility], [place] [address] [place], [place] [address]   Culture, blood (routine x 2)     Status: None (Preliminary result)   Collection Time: 07/25/19  3:49 PM   Specimen: BLOOD  Result Value Ref Range Status   Specimen Description BLOOD BLOOD RIGHT WRIST  Final   Special Requests   Final    BOTTLES DRAWN AEROBIC AND ANAEROBIC Blood Culture adequate volume   Culture   Final    NO GROWTH < 24 HOURS Performed at [facility], [address] [place], [place] [address]    Report Status PENDING  Incomplete  Culture, blood (routine x 2)     Status: None (Preliminary result)   Collection Time: 07/25/19  3:49 PM   Specimen: BLOOD  Result Value Ref Range Status   Specimen Description BLOOD BLOOD RIGHT ARM  Final   Special Requests   Final    BOTTLES DRAWN AEROBIC AND ANAEROBIC Blood Culture adequate volume   Culture   Final    NO GROWTH < 24 HOURS Performed at [facility], [address], [place], [place] [address]    Report Status PENDING  Incomplete     Radiology Studies: DG Chest Portable 1 View  Result Date: 07/25/2019 CLINICAL DATA:  Chronic shortness of breath.  COVID positive. EXAM: PORTABLE CHEST 1 VIEW COMPARISON:  Chest x-ray dated June 27, 2019. FINDINGS: Unchanged mild cardiomegaly. Unchanged mild diffuse interstitial thickening and trace bilateral pleural effusions. No consolidation or pneumothorax. No acute osseous abnormality. IMPRESSION: 1. Unchanged mild congestive heart failure. No progressive interstitial thickening or focal opacity to suggest superimposed pneumonia. Electronically Signed   By: Titus Dubin M.D.   On: 07/25/2019 11:14    Scheduled Meds: . acetic acid  1 application Irrigation See admin instructions  . vitamin C  500 mg  Oral Daily  . aspirin EC  81 mg Oral Daily  . dexamethasone  6 mg Oral Daily  . docusate sodium  100 mg Oral Daily  . ferrous sulfate  325 mg Oral Daily  . hydrALAZINE  25 mg Oral TID  . insulin aspart  0-6 Units Subcutaneous TID WC  . insulin aspart  2 Units Subcutaneous TID WC  . loratadine  10 mg Oral Daily  . pravastatin  20 mg Oral q1800  . sodium chloride flush  3 mL Intravenous Q12H  . [START ON 07/31/2019] Vitamin D (Ergocalciferol)  50,000 Units Oral Q Sun  . zinc sulfate  220 mg Oral Daily   Continuous Infusions: . sodium chloride    . albumin human    . furosemide (LASIX) infusion 4 mg/hr (07/26/19 1415)  . heparin 1,400 Units/hr (07/26/19 1420)     LOS: 1 day   Time spent: 45 minutes.  Lorella Nimrod, MD  [facility]  If 7PM-7AM, please contact night-coverage [email or web address]  07/26/2019, 3:47 PM   This record has been created using Systems analyst. Errors have been sought and corrected,but may not always be located. Such creation errors do not reflect on the standard of care.

## 2019-07-26 NOTE — NC FL2 (Signed)
[place] LEVEL OF CARE SCREENING TOOL     IDENTIFICATION  Patient Name: Miguel Torres Birthdate: 12-17-1930 Sex: male Admission Date (Current Location): 07/25/2019  [place] Number:  Engineering geologist and Address:  [facility], [address] [place], [place] [address]      Provider Number: [id number]  Attending Physician Name and Address:  Lorella Nimrod, MD  Relative Name and Phone Number:       Current Level of Care: Hospital Recommended Level of Care: [place] Prior Approval Number:    Date Approved/Denied:   PASRR Number:    Discharge Plan: SNF    Current Diagnoses: Patient Active Problem List   Diagnosis Date Noted  . COVID-19 virus infection 07/25/2019  . NSTEMI (non-ST elevated myocardial infarction) ([place]) 07/25/2019  . Acute on chronic diastolic CHF (congestive heart failure) ([place]) 06/21/2019  . HTN (hypertension) 06/21/2019  . Type II diabetes mellitus with renal manifestations ([place]) 06/21/2019  . Iron deficiency anemia 06/21/2019  . PAD (peripheral artery disease) ([place])   . Venous ulcer of left lower extremity without varicose veins ([place]) 06/13/2019  . S/P BKA (below knee amputation) unilateral, right ([place]) 05/18/2019  . Atherosclerosis of native arteries of the extremities with ulceration ([place]) 03/14/2019  . Resides in skilled nursing facility 02/24/2018  . Benign prostatic hyperplasia with urinary obstruction 12/23/2017  . Urinary retention 12/18/2017  . Acute diastolic (congestive) heart failure ([place]) 12/17/2017  . Goals of care, counseling/discussion   . Palliative care encounter   . Congestive heart failure (CHF) ([place]) 12/16/2017  . Acute diastolic CHF (congestive heart failure) ([place]) 12/16/2017  . Acute kidney injury ([place]) 12/07/2017  . Foot ulceration, left, with fat layer exposed ([place]) 11/23/2017  . Lymphedema 11/23/2017  . Shoulder pain, right 11/04/2017  . Pressure  injury of skin 11/02/2017  . Elevated troponin 11/01/2017  . Microalbuminuria 10/15/2016  . BCC (basal cell carcinoma), face 06/24/2016  . Vitamin D deficiency 07/12/2015  . Retinopathy, diabetic, proliferative ([place]) 07/02/2015  . Bleeding duodenal ulcer 02/02/2015  . Atrial flutter ([place]) 01/19/2015  . Allergic rhinitis 01/17/2015  . Anemia of chronic disease 01/17/2015  . CKD (chronic kidney disease) stage 4, GFR 15-29 ml/min (HCC) 01/17/2015  . Diabetes mellitus with nephropathy ([place]) 01/17/2015  . Edema 01/17/2015  . Gout 01/17/2015  . Amputation of right lower extremity below knee upon examination ([place]) 01/17/2015  . HLD (hyperlipidemia) 01/17/2015  . Psoriasis 01/17/2015  . CA of skin 01/17/2015  . Acquired absence of right leg below knee ([place]) 01/17/2015  . Hypertension 10/27/2014  . Mild cognitive disorder 12/05/2013  . Postop check 02/10/2013  . Cancer of skin, squamous cell 01/21/2013  . Squamous cell carcinoma of scalp and skin of neck 03/03/2011    Orientation RESPIRATION BLADDER Height & Weight     Self, Time, Situation, Place  O2(2 L [place]) Continent Weight: 126.5 kg Height:  5\' 8"  (172.7 cm)  BEHAVIORAL SYMPTOMS/MOOD NEUROLOGICAL BOWEL NUTRITION STATUS      Continent Diet(Dysphagia 1 fluid restriction 1213ml)  AMBULATORY STATUS COMMUNICATION OF NEEDS Skin   Total Care Verbally Normal                       Personal Care Assistance Level of Assistance  Bathing, Feeding, Dressing Bathing Assistance: Limited assistance Feeding assistance: Independent Dressing Assistance: Limited assistance     Functional Limitations Info  SPECIAL CARE FACTORS FREQUENCY                       Contractures Contractures Info: Not present    Additional Factors Info  Code Status, Allergies Code Status Info: Full Allergies Info: NKA           Current Medications (07/26/2019):  This is the current hospital active medication list Current  Facility-Administered Medications  Medication Dose Route Frequency Provider Last Rate Last Admin  . 0.9 %  sodium chloride infusion  250 mL Intravenous PRN Agbata, Tochukwu, MD      . acetaminophen (TYLENOL) tablet 650 mg  650 mg Oral Q6H PRN Agbata, Tochukwu, MD      . acetic acid 0.25 % irrigation 1 application  1 application Irrigation See admin instructions Agbata, Tochukwu, MD      . albumin human 25 % solution 12.5 g  12.5 g Intravenous QHS Singh, Harmeet, MD      . apixaban (ELIQUIS) tablet 2.5 mg  2.5 mg Oral BID Lorella Nimrod, MD   2.5 mg at 07/26/19 1709  . ascorbic acid (VITAMIN C) tablet 500 mg  500 mg Oral Daily Lorella Nimrod, MD   500 mg at 07/26/19 0910  . aspirin EC tablet 81 mg  81 mg Oral Daily Agbata, Tochukwu, MD   81 mg at 07/26/19 0926  . chlorpheniramine-HYDROcodone (TUSSIONEX) 10-8 MG/5ML suspension 5 mL  5 mL Oral Q12H PRN Lorella Nimrod, MD      . dexamethasone (DECADRON) tablet 6 mg  6 mg Oral Daily Agbata, Tochukwu, MD   6 mg at 07/26/19 0911  . docusate sodium (COLACE) capsule 100 mg  100 mg Oral Daily Agbata, Tochukwu, MD   100 mg at 07/26/19 0911  . ferrous sulfate tablet 325 mg  325 mg Oral Daily Agbata, Tochukwu, MD   325 mg at 07/26/19 0911  . furosemide (LASIX) 250 mg in dextrose 5 % 250 mL (1 mg/mL) infusion  4 mg/hr Intravenous Continuous Murlean Iba, MD 4 mL/hr at 07/26/19 1415 4 mg/hr at 07/26/19 1415  . guaiFENesin-dextromethorphan (ROBITUSSIN DM) 100-10 MG/5ML syrup 10 mL  10 mL Oral Q4H PRN Lorella Nimrod, MD      . hydrALAZINE (APRESOLINE) tablet 25 mg  25 mg Oral TID Agbata, Tochukwu, MD   25 mg at 07/26/19 1618  . insulin aspart (novoLOG) injection 0-6 Units  0-6 Units Subcutaneous TID WC Agbata, Tochukwu, MD      . insulin aspart (novoLOG) injection 2 Units  2 Units Subcutaneous TID WC Agbata, Tochukwu, MD      . Ipratropium-Albuterol (COMBIVENT) respimat 1 puff  1 puff Inhalation Q6H Lorella Nimrod, MD   1 puff at 07/26/19 1701  . loratadine  (CLARITIN) tablet 10 mg  10 mg Oral Daily Agbata, Tochukwu, MD   10 mg at 07/26/19 0926  . ondansetron (ZOFRAN) injection 4 mg  4 mg Intravenous Q6H PRN Agbata, Tochukwu, MD      . pravastatin (PRAVACHOL) tablet 20 mg  20 mg Oral q1800 Agbata, Tochukwu, MD      . remdesivir 200 mg in sodium chloride 0.9% 250 mL IVPB  200 mg Intravenous Once Lorella Nimrod, MD 580 mL/hr at 07/26/19 1711 200 mg at 07/26/19 1711   Followed by  . [START ON 07/27/2019] remdesivir 100 mg in sodium chloride 0.9 % 100 mL IVPB  100 mg Intravenous Daily Lorella Nimrod, MD      . sodium chloride flush (NS) 0.9 % injection  3 mL  3 mL Intravenous Q12H Agbata, Tochukwu, MD      . sodium chloride flush (NS) 0.9 % injection 3 mL  3 mL Intravenous PRN Agbata, Tochukwu, MD      . Derrill Memo ON 07/31/2019] Vitamin D (Ergocalciferol) (DRISDOL) capsule 50,000 Units  50,000 Units Oral Q Sun Agbata, Tochukwu, MD      . zinc sulfate capsule 220 mg  220 mg Oral Daily Lorella Nimrod, MD   220 mg at 07/26/19 0911     Discharge Medications: Please see discharge summary for a list of discharge medications.  Relevant Imaging Results:  Relevant Lab Results:   Additional Information TVV:[id number]  Shelbie Hutching, RN

## 2019-07-26 NOTE — Consult Note (Signed)
ANTICOAGULATION CONSULT NOTE   Pharmacy Consult for Heparin Indication: chest pain/ACS  No Known Allergies  Patient Measurements: Height: 5\' 8"  (172.7 cm) Weight: 218 lb 0.6 oz (98.9 kg) IBW/kg (Calculated) : 68.4 Heparin Dosing Weight: 89.5 kg  Vital Signs: Temp: 97.6 F (36.4 C) (03/22 2107) Temp Source: Oral (03/22 2107) BP: 105/68 (03/22 2230) Pulse Rate: 87 (03/22 2230)  Labs: Recent Labs    07/25/19 1325 07/25/19 1707 07/26/19 0103  HGB 9.8*  --  9.9*  HCT 30.1*  --  30.5*  PLT 200  --  200  APTT  --  151* 91*  LABPROT  --  24.6*  --   INR  --  2.2*  --   HEPARINUNFRC  --  >3.60*  --   CREATININE 2.58*  --  2.33*  TROPONINIHS 942* 983*  --     Estimated Creatinine Clearance: 25.9 mL/min (A) (by C-G formula based on SCr of 2.33 mg/dL (H)).   Medical History: Past Medical History:  Diagnosis Date  . Anemia   . CHF (congestive heart failure) ([place])   . Diabetes mellitus without complication ([place])   . Hyperlipidemia   . Hypertension   . PVD (peripheral vascular disease) ([place])   . Skin cancer     Medications:  Medications Prior to Admission  Medication Sig Dispense Refill Last Dose  . acetaminophen (TYLENOL) 325 MG tablet Take 2 tablets (650 mg total) by mouth every 6 (six) hours as needed for mild pain (or Fever >/= 101). (Patient taking differently: Take 650 mg by mouth every 6 (six) hours as needed for mild pain (general discomfort Max 3 grams/24hrs.). )   Unknown at PRN  . acetic acid 0.25 % irrigation Irrigate with 1 application as directed See admin instructions. Use 30 ml via irrigation every evening shift for decrease bacterial load of supra pubic catheter   As directed at Unknown  . apixaban (ELIQUIS) 2.5 MG TABS tablet Take 1 tablet (2.5 mg total) by mouth 2 (two) times daily. 60 tablet  07/25/2019 at 0730  . aspirin EC 81 MG EC tablet Take 1 tablet (81 mg total) by mouth daily.     . beta carotene w/minerals (OCUVITE) tablet Take 1 tablet by mouth  daily.   07/25/2019 at 0730  . bumetanide (BUMEX) 0.5 MG tablet Take 1 tablet (0.5 mg total) by mouth daily.   07/25/2019 at 0730  . Cholecalciferol (VITAMIN D3) 50000 units CAPS Take 50,000 Units by mouth every Sunday.    07/24/2019 at 0800  . docusate sodium (COLACE) 100 MG capsule Take 100 mg by mouth daily.   07/25/2019 at 0730  . felodipine (PLENDIL) 2.5 MG 24 hr tablet Take 7.5 mg by mouth daily.   07/25/2019 at 0730  . ferrous sulfate 325 (65 FE) MG tablet Take 325 mg by mouth daily.    07/25/2019 at 0730  . fexofenadine (ALLEGRA) 60 MG tablet Take 60 mg by mouth daily as needed for rhinitis (seasonal allergies.).    Unknown at PRN  . furosemide (LASIX) 40 MG tablet Take 40 mg by mouth daily.   07/25/2019 at 0730  . hydrALAZINE (APRESOLINE) 25 MG tablet Take 25 mg by mouth 3 (three) times daily.    07/25/2019 at 0730  . insulin aspart (NOVOLOG) 100 UNIT/ML injection Inject 0-9 Units into the skin 3 (three) times daily with meals. (Patient taking differently: Inject 3-9 Units into the skin 3 (three) times daily with meals. Sliding Scale Insulin:150-200=3 units, 201-250=5 units, 251-300=7  units, 301-350=9 units) 10 mL 11 As directed at Unknown  . insulin detemir (LEVEMIR) 100 UNIT/ML injection Inject 32 Units into the skin at bedtime.   07/24/2019 at 2100  . LEVEMIR FLEXTOUCH 100 UNIT/ML Pen Inject 22 Units into the skin daily.    07/25/2019 at 0730  . lovastatin (MEVACOR) 40 MG tablet TAKE 1 TABLET (40 MG TOTAL) BY MOUTH DAILY. (Patient taking differently: Take 40 mg by mouth daily. ) 90 tablet 3   . metoprolol tartrate (LOPRESSOR) 25 MG tablet Take 25 mg by mouth 2 (two) times daily.   07/25/2019 at 0730   Scheduled:  Infusions:  PRN:  Anti-infectives (From admission, onward)   None      Assessment: Pharmacy consulted to start heparin for ACS. CBC stable. Takes apixaban 2.5 mg (last dose 3/22) Hx of a flutter. Will order baseline heparin level, if falsely elevated will switch to aPTT monitoring.  Trop HS 942.  Goal of Therapy:  Heparin level 0.3-0.7 units/ml, once aPTT and HL correlate.  aPTT 66-102 seconds Monitor platelets by anticoagulation protocol: Yes   Plan:  03/23 @ 0100 aPTT 91 seconds therapeutic. Will continue current rate and will recheck aPTT at 0700 and continue to monitor.  Tobie Lords, PharmD, BCPS 07/26/2019,1:55 AM

## 2019-07-26 NOTE — Consult Note (Signed)
ANTICOAGULATION CONSULT NOTE   Pharmacy Consult for Heparin Indication: chest pain/ACS  No Known Allergies  Patient Measurements: Height: 5\' 8"  (172.7 cm) Weight: 278 lb 14.1 oz (126.5 kg) IBW/kg (Calculated) : 68.4 Heparin Dosing Weight: 89.5 kg  Vital Signs: Temp: 97.8 F (36.6 C) (03/23 0751) Temp Source: Axillary (03/23 0751) BP: 115/49 (03/23 0751) Pulse Rate: 79 (03/23 0751)  Labs: Recent Labs    07/25/19 1325 07/25/19 1325 07/25/19 1707 07/26/19 0103 07/26/19 0827 07/26/19 1005  HGB 9.8*  --   --  9.9*  --   --   HCT 30.1*  --   --  30.5*  --   --   PLT 200  --   --  200  --   --   APTT  --   --  151* 91* 40*  --   LABPROT  --   --  24.6*  --   --   --   INR  --   --  2.2*  --   --   --   HEPARINUNFRC  --   --  >3.60* 3.38* 2.66*  --   CREATININE 2.58*  --   --  2.33*  --   --   TROPONINIHS 942*   < > 983*  --  619* 555*   < > = values in this interval not displayed.    Estimated Creatinine Clearance: 29.5 mL/min (A) (by C-G formula based on SCr of 2.33 mg/dL (H)).   Medical History: Past Medical History:  Diagnosis Date  . Anemia   . CHF (congestive heart failure) ([place])   . Diabetes mellitus without complication ([place])   . Hyperlipidemia   . Hypertension   . PVD (peripheral vascular disease) ([place])   . Skin cancer     Medications:  Medications Prior to Admission  Medication Sig Dispense Refill Last Dose  . acetaminophen (TYLENOL) 325 MG tablet Take 2 tablets (650 mg total) by mouth every 6 (six) hours as needed for mild pain (or Fever >/= 101). (Patient taking differently: Take 650 mg by mouth every 6 (six) hours as needed for mild pain (general discomfort Max 3 grams/24hrs.). )   Unknown at PRN  . acetic acid 0.25 % irrigation Irrigate with 1 application as directed See admin instructions. Use 30 ml via irrigation every evening shift for decrease bacterial load of supra pubic catheter   As directed at Unknown  . apixaban (ELIQUIS) 2.5 MG TABS tablet  Take 1 tablet (2.5 mg total) by mouth 2 (two) times daily. 60 tablet  07/25/2019 at 0730  . aspirin EC 81 MG EC tablet Take 1 tablet (81 mg total) by mouth daily.     . beta carotene w/minerals (OCUVITE) tablet Take 1 tablet by mouth daily.   07/25/2019 at 0730  . bumetanide (BUMEX) 0.5 MG tablet Take 1 tablet (0.5 mg total) by mouth daily.   07/25/2019 at 0730  . Cholecalciferol (VITAMIN D3) 50000 units CAPS Take 50,000 Units by mouth every Sunday.    07/24/2019 at 0800  . docusate sodium (COLACE) 100 MG capsule Take 100 mg by mouth daily.   07/25/2019 at 0730  . felodipine (PLENDIL) 2.5 MG 24 hr tablet Take 7.5 mg by mouth daily.   07/25/2019 at 0730  . ferrous sulfate 325 (65 FE) MG tablet Take 325 mg by mouth daily.    07/25/2019 at 0730  . fexofenadine (ALLEGRA) 60 MG tablet Take 60 mg by mouth daily as needed for rhinitis (seasonal  allergies.).    Unknown at PRN  . furosemide (LASIX) 40 MG tablet Take 40 mg by mouth daily.   07/25/2019 at 0730  . hydrALAZINE (APRESOLINE) 25 MG tablet Take 25 mg by mouth 3 (three) times daily.    07/25/2019 at 0730  . insulin aspart (NOVOLOG) 100 UNIT/ML injection Inject 0-9 Units into the skin 3 (three) times daily with meals. (Patient taking differently: Inject 3-9 Units into the skin 3 (three) times daily with meals. Sliding Scale Insulin:150-200=3 units, 201-250=5 units, 251-300=7 units, 301-350=9 units) 10 mL 11 As directed at Unknown  . insulin detemir (LEVEMIR) 100 UNIT/ML injection Inject 32 Units into the skin at bedtime.   07/24/2019 at 2100  . LEVEMIR FLEXTOUCH 100 UNIT/ML Pen Inject 22 Units into the skin daily.    07/25/2019 at 0730  . lovastatin (MEVACOR) 40 MG tablet TAKE 1 TABLET (40 MG TOTAL) BY MOUTH DAILY. (Patient taking differently: Take 40 mg by mouth daily. ) 90 tablet 3   . metoprolol tartrate (LOPRESSOR) 25 MG tablet Take 25 mg by mouth 2 (two) times daily.   07/25/2019 at 0730   Scheduled:  Infusions:  PRN:  Anti-infectives (From admission,  onward)   None      Assessment: Pharmacy consulted to start heparin for ACS. CBC stable. Takes apixaban 2.5 mg (last dose 3/22) Hx of a flutter. Will order baseline heparin level, if falsely elevated will switch to aPTT monitoring. Trop HS 942.   03/23 @ 0100 aPTT 91 seconds therapeutic. Will continue current rate  3/23: Patient's weight was incorrectly placed in chart upon arrival to ED. Weight is actually 278 lbs, 14 oz(126.5kg), not 218 lbs(98.9kg), therefore will adjust dose accordingly.  Goal of Therapy:  Heparin level 0.3-0.7 units/ml, once aPTT and HL correlate.  aPTT 66-102 seconds Monitor platelets by anticoagulation protocol: Yes   Plan:  3/23@0827 : aPTT 40sec. Subtherapeutic. Will avoid bolus and increase heparin infusion rate to 1400 units/hr that reflects both subtherapeutic level and updated weight. Will recheck aPTT in 8 hours and continue to monitor.  Pearla Dubonnet, PharmD 07/26/2019,1:41 PM

## 2019-07-26 NOTE — Consult Note (Signed)
ANTICOAGULATION CONSULT NOTE   Pharmacy Consult for Heparin Indication: chest pain/ACS  No Known Allergies  Patient Measurements: Height: 5\' 8"  (172.7 cm) Weight: 278 lb 14.1 oz (126.5 kg) IBW/kg (Calculated) : 68.4 Heparin Dosing Weight: 89.5 kg  Vital Signs:    Labs: Recent Labs    07/25/19 1325 07/25/19 1325 07/25/19 1707 07/25/19 1707 07/26/19 0103 07/26/19 0827 07/26/19 1005 07/26/19 2133  HGB 9.8*  --   --   --  9.9*  --   --   --   HCT 30.1*  --   --   --  30.5*  --   --   --   PLT 200  --   --   --  200  --   --   --   APTT  --   --  151*   < > 91* 40*  --  44*  LABPROT  --   --  24.6*  --   --   --   --   --   INR  --   --  2.2*  --   --   --   --   --   HEPARINUNFRC  --   --  >3.60*  --  3.38* 2.66*  --   --   CREATININE 2.58*  --   --   --  2.33*  --   --   --   TROPONINIHS 942*   < > 983*  --   --  619* 555*  --    < > = values in this interval not displayed.    Estimated Creatinine Clearance: 29.5 mL/min (A) (by C-G formula based on SCr of 2.33 mg/dL (H)).   Medical History: Past Medical History:  Diagnosis Date  . Anemia   . CHF (congestive heart failure) ([place])   . Diabetes mellitus without complication ([place])   . Hyperlipidemia   . Hypertension   . PVD (peripheral vascular disease) ([place])   . Skin cancer     Medications:  Medications Prior to Admission  Medication Sig Dispense Refill Last Dose  . acetaminophen (TYLENOL) 325 MG tablet Take 2 tablets (650 mg total) by mouth every 6 (six) hours as needed for mild pain (or Fever >/= 101). (Patient taking differently: Take 650 mg by mouth every 6 (six) hours as needed for mild pain (general discomfort Max 3 grams/24hrs.). )   Unknown at PRN  . acetic acid 0.25 % irrigation Irrigate with 1 application as directed See admin instructions. Use 30 ml via irrigation every evening shift for decrease bacterial load of supra pubic catheter   As directed at Unknown  . apixaban (ELIQUIS) 2.5 MG TABS tablet Take  1 tablet (2.5 mg total) by mouth 2 (two) times daily. 60 tablet  07/25/2019 at 0730  . aspirin EC 81 MG EC tablet Take 1 tablet (81 mg total) by mouth daily.     . beta carotene w/minerals (OCUVITE) tablet Take 1 tablet by mouth daily.   07/25/2019 at 0730  . bumetanide (BUMEX) 0.5 MG tablet Take 1 tablet (0.5 mg total) by mouth daily.   07/25/2019 at 0730  . Cholecalciferol (VITAMIN D3) 50000 units CAPS Take 50,000 Units by mouth every Sunday.    07/24/2019 at 0800  . docusate sodium (COLACE) 100 MG capsule Take 100 mg by mouth daily.   07/25/2019 at 0730  . felodipine (PLENDIL) 2.5 MG 24 hr tablet Take 7.5 mg by mouth daily.   07/25/2019 at 0730  .  ferrous sulfate 325 (65 FE) MG tablet Take 325 mg by mouth daily.    07/25/2019 at 0730  . fexofenadine (ALLEGRA) 60 MG tablet Take 60 mg by mouth daily as needed for rhinitis (seasonal allergies.).    Unknown at PRN  . furosemide (LASIX) 40 MG tablet Take 40 mg by mouth daily.   07/25/2019 at 0730  . hydrALAZINE (APRESOLINE) 25 MG tablet Take 25 mg by mouth 3 (three) times daily.    07/25/2019 at 0730  . insulin aspart (NOVOLOG) 100 UNIT/ML injection Inject 0-9 Units into the skin 3 (three) times daily with meals. (Patient taking differently: Inject 3-9 Units into the skin 3 (three) times daily with meals. Sliding Scale Insulin:150-200=3 units, 201-250=5 units, 251-300=7 units, 301-350=9 units) 10 mL 11 As directed at Unknown  . insulin detemir (LEVEMIR) 100 UNIT/ML injection Inject 32 Units into the skin at bedtime.   07/24/2019 at 2100  . LEVEMIR FLEXTOUCH 100 UNIT/ML Pen Inject 22 Units into the skin daily.    07/25/2019 at 0730  . lovastatin (MEVACOR) 40 MG tablet TAKE 1 TABLET (40 MG TOTAL) BY MOUTH DAILY. (Patient taking differently: Take 40 mg by mouth daily. ) 90 tablet 3   . metoprolol tartrate (LOPRESSOR) 25 MG tablet Take 25 mg by mouth 2 (two) times daily.   07/25/2019 at 0730   Scheduled:  Infusions:  PRN:  Anti-infectives (From admission, onward)    Start     Dose/Rate Route Frequency Ordered Stop   07/27/19 1000  remdesivir 100 mg in sodium chloride 0.9 % 100 mL IVPB     100 mg 200 mL/hr over 30 Minutes Intravenous Daily 07/26/19 1559 07/31/19 0959   07/26/19 1700  remdesivir 200 mg in sodium chloride 0.9% 250 mL IVPB     200 mg 580 mL/hr over 30 Minutes Intravenous Once 07/26/19 1559 07/26/19 1741      Assessment: Pharmacy consulted to start heparin for ACS. CBC stable. Takes apixaban 2.5 mg (last dose 3/22) Hx of a flutter. Will order baseline heparin level, if falsely elevated will switch to aPTT monitoring. Trop HS 942.   03/23 @ 0100 aPTT 91 seconds therapeutic. Will continue current rate  3/23: Patient's weight was incorrectly placed in chart upon arrival to ED. Weight is actually 278 lbs, 14 oz(126.5kg), not 218 lbs(98.9kg), therefore will adjust dose accordingly.  Goal of Therapy:  Heparin level 0.3-0.7 units/ml, once aPTT and HL correlate.  aPTT 66-102 seconds Monitor platelets by anticoagulation protocol: Yes   Plan:  3/23@0827 : aPTT 40sec. Subtherapeutic. Will avoid bolus and increase heparin infusion rate to 1400 units/hr that reflects both subtherapeutic level and updated weight. Will recheck aPTT in 8 hours and continue to monitor.  3/23: Heparin gtt d/c @ 1554;  Eliquis resumed.  Will d/c heparin consult.   Diahann Guajardo D, PharmD 07/26/2019,10:40 PM

## 2019-07-26 NOTE — Progress Notes (Signed)
[facility] Daily Progress Note  Subjective: 06/24/19:             1.  Abdominal aortogram             2.  Left lower extremity angiography third order catheter placement             3.  Star close right common femoral Findings: The left common femoral profunda femoris and superficial femoral artery are widely patent there is some calcific atherosclerotic changes but there is essentially no significant stenosis.  Just below the knee in the popliteal there is a focal 60 to 70% stenosis.  Below that the popliteal is patent the trifurcation however is heavily diseased the anterior tibial occludes 2 cm beyond its origin remains occluded for approximately 10 to 12 cm.  It reconstitutes toward the mid calf but from the point of reconstitution to the ankle there are numerous greater than 90% stenoses.  The dorsalis pedis is a thread with diffuse greater than 70 to 80% stenosis.  There is poor filling of the pedal arch.  The tibioperoneal trunk posterior tibial and peroneal are nonvisualized throughout their entire courses.  The procedure was terminated at that point, due to the patients pre-existing kidney dysfunction. Of note, the patient did experience worsening kidney function for approximately 1 week status post this procedure.  Patient was last seen in our outpatient setting on July 18, 2019 and placed in a zinc oxide Unna wrap to the left lower extremity.   Patient presented to the [facility] emergency department after testing positive for COVID-19 with progressively worsening shortness of breath.  Vascular was consulted for the patient's pre-existing peripheral artery disease.  Objective: Vitals:   07/25/19 2107 07/25/19 2230 07/26/19 0500 07/26/19 0751  BP: 117/77 105/68  (!) 115/49  Pulse: 88 87  79  Resp: (!) 23 (!) 27  18  Temp: 97.6 F (36.4 C)   97.8 F (36.6 C)  TempSrc: Oral   Axillary  SpO2: 99% 98%  96%  Weight:   126.5 kg    Height:        Intake/Output Summary (Last 24 hours) at 07/26/2019 1639 Last data filed at 07/26/2019 0500 Gross per 24 hour  Intake 170.94 ml  Output 800 ml  Net -629.06 ml   Physical Exam: A&Ox3, NAD CV: RRR Pulmonary: Decreased bilaterally Abdomen: Soft, Nontender, Nondistended Vascular:  Right BKA stump: Skin is intact.  Left lower extremity: Thigh soft.  Calf soft.  Unna wrap is in place clean and dry.  Toes are warm.  Good capillary refill.   Laboratory: CBC    Component Value Date/Time   WBC 14.7 (H) 07/26/2019 0103   HGB 9.9 (L) 07/26/2019 0103   HGB 11.0 (L) 10/15/2016 1611   HCT 30.5 (L) 07/26/2019 0103   HCT 34.5 (L) 10/15/2016 1611   PLT 200 07/26/2019 0103   PLT 183 10/15/2016 1611   BMET    Component Value Date/Time   NA 132 (L) 07/26/2019 0103   NA 138 10/15/2016 1611   NA 140 09/30/2012 1609   K 4.1 07/26/2019 0103   K 4.5 09/30/2012 1609   CL 99 07/26/2019 0103   CL 111 (H) 09/30/2012 1609   CO2 24 07/26/2019 0103   CO2 24 09/30/2012 1609   GLUCOSE 85 07/26/2019 0103   GLUCOSE 90 09/30/2012 1609   BUN 64 (H) 07/26/2019 0103   BUN 30 (H) 10/15/2016 1611   BUN 28 (  H) 09/30/2012 1609   CREATININE 2.33 (H) 07/26/2019 0103   CREATININE 1.33 (H) 09/30/2012 1609   CALCIUM 7.9 (L) 07/26/2019 0103   CALCIUM 8.5 09/30/2012 1609   GFRNONAA 24 (L) 07/26/2019 0103   GFRNONAA 50 (L) 09/30/2012 1609   GFRAA 28 (L) 07/26/2019 0103   GFRAA 58 (L) 09/30/2012 1609   Assessment/Planning: The patient is an 86 year old male with multiple medical issues including known peripheral artery disease status post most recent left lower extremity intervention on June 24, 2019.   1) Atherosclerotic Disease: Seen and examined with Dr. Delana Meyer.  Patient denies any pain to the left lower extremity.  At this time, there is no acute vascular compromise to the left lower extremity.  Edema has been controlled with a unna wrap. Would not move forward with angiogram until  the patient's Covid and cardiopulmonary status has improved. Patient with known chronic kidney disease which worsened for approximately one week s/p his most recent left lower extremity intervention.  Will continue to monitor. Continue ASA, Eliquis and Statin  Seen and examined with Dr. Eber Hong [facility] PA-C 07/26/2019 4:39 PM

## 2019-07-26 NOTE — TOC Initial Note (Signed)
Transition of Care [facility]) - Initial/Assessment Note    Patient Details  Name: Miguel Torres MRN: [id number] Date of Birth: Jun 01, 1930  [facility]) CM/SW Contact:    Shelbie Hutching, RN Phone Number: 07/26/2019, 5:29 PM  Clinical Narrative:                 Patient is long term care at [facility] admitted to the hospital for COVID 19 and acute on chronic CHF.  Plan for discharge will be back to [facility] when medically stable.   Expected Discharge Plan: [place]     Patient Goals and CMS Choice        Expected Discharge Plan and Services Expected Discharge Plan: [place]       Living arrangements for the past 2 months: [place]                                      Prior Living Arrangements/Services Living arrangements for the past 2 months: [place] Lives with:: Facility Resident Patient language and need for interpreter reviewed:: Yes Do you feel safe going back to the place where you live?: Yes      Need for Family Participation in Patient Care: No (Comment)     Criminal Activity/Legal Involvement Pertinent to Current Situation/Hospitalization: No - Comment as needed  Activities of Daily Living Home Assistive Devices/Equipment: None ADL Screening (condition at time of admission) Patient's cognitive ability adequate to safely complete daily activities?: No Is the patient deaf or have difficulty hearing?: Yes Does the patient have difficulty seeing, even when wearing glasses/contacts?: Yes Does the patient have difficulty concentrating, remembering, or making decisions?: Yes Patient able to express need for assistance with ADLs?: No Does the patient have difficulty dressing or bathing?: Yes Independently performs ADLs?: No Communication: Independent Dressing (OT): Needs assistance Is this a change from baseline?: Pre-admission baseline Grooming: Needs assistance Is  this a change from baseline?: Pre-admission baseline Feeding: Needs assistance Is this a change from baseline?: Pre-admission baseline Bathing: Needs assistance Is this a change from baseline?: Pre-admission baseline Toileting: Needs assistance Is this a change from baseline?: Pre-admission baseline In/Out Bed: Needs assistance Is this a change from baseline?: Pre-admission baseline Walks in Home: Needs assistance, Independent Is this a change from baseline?: Pre-admission baseline Does the patient have difficulty walking or climbing stairs?: Yes Weakness of Legs: Both Weakness of Arms/Hands: Both  Permission Sought/Granted Permission sought to share information with : Case Manager, Chartered certified accountant granted to share information with : Yes, Verbal Permission Granted     Permission granted to share info w AGENCY: Armed forces logistics/support/administrative officer       Orientation: : Oriented to Self, Oriented to Place, Oriented to  Time, Oriented to Situation Alcohol / Substance Use: Not Applicable Psych Involvement: No (comment)  Admission diagnosis:  Elevated troponin [R77.8] Acute on chronic diastolic CHF (congestive heart failure) ([place]) [I50.33] COVID-19 virus infection [U07.1] Patient Active Problem List   Diagnosis Date Noted  . COVID-19 virus infection 07/25/2019  . NSTEMI (non-ST elevated myocardial infarction) ([place]) 07/25/2019  . Acute on chronic diastolic CHF (congestive heart failure) ([place]) 06/21/2019  . HTN (hypertension) 06/21/2019  . Type II diabetes mellitus with renal manifestations ([place]) 06/21/2019  . Iron deficiency anemia 06/21/2019  . PAD (peripheral artery disease) ([place])   .  Venous ulcer of left lower extremity without varicose veins ([place]) 06/13/2019  . S/P BKA (below knee amputation) unilateral, right ([place]) 05/18/2019  . Atherosclerosis of native arteries of the extremities with ulceration ([place]) 03/14/2019  . Resides in skilled  nursing facility 02/24/2018  . Benign prostatic hyperplasia with urinary obstruction 12/23/2017  . Urinary retention 12/18/2017  . Acute diastolic (congestive) heart failure ([place]) 12/17/2017  . Goals of care, counseling/discussion   . Palliative care encounter   . Congestive heart failure (CHF) ([place]) 12/16/2017  . Acute diastolic CHF (congestive heart failure) ([place]) 12/16/2017  . Acute kidney injury ([place]) 12/07/2017  . Foot ulceration, left, with fat layer exposed ([place]) 11/23/2017  . Lymphedema 11/23/2017  . Shoulder pain, right 11/04/2017  . Pressure injury of skin 11/02/2017  . Elevated troponin 11/01/2017  . Microalbuminuria 10/15/2016  . BCC (basal cell carcinoma), face 06/24/2016  . Vitamin D deficiency 07/12/2015  . Retinopathy, diabetic, proliferative ([place]) 07/02/2015  . Bleeding duodenal ulcer 02/02/2015  . Atrial flutter ([place]) 01/19/2015  . Allergic rhinitis 01/17/2015  . Anemia of chronic disease 01/17/2015  . CKD (chronic kidney disease) stage 4, GFR 15-29 ml/min (HCC) 01/17/2015  . Diabetes mellitus with nephropathy ([place]) 01/17/2015  . Edema 01/17/2015  . Gout 01/17/2015  . Amputation of right lower extremity below knee upon examination ([place]) 01/17/2015  . HLD (hyperlipidemia) 01/17/2015  . Psoriasis 01/17/2015  . CA of skin 01/17/2015  . Acquired absence of right leg below knee ([place]) 01/17/2015  . Hypertension 10/27/2014  . Mild cognitive disorder 12/05/2013  . Postop check 02/10/2013  . Cancer of skin, squamous cell 01/21/2013  . Squamous cell carcinoma of scalp and skin of neck 03/03/2011   PCP:  [name], MD Pharmacy:   [facility], [place]. [place] [address] Phone: [phone] Fax: [phone]  [id number] - [place], [place] - [address] [place] [address] Phone: [phone] Fax: [phone]     Social Determinants of Health (SDOH) Interventions     Readmission Risk Interventions No flowsheet data found.

## 2019-07-26 NOTE — Consult Note (Signed)
[address] [place], [place] [address] Phone [phone]. Fax [phone]  Date: 07/26/2019                  Patient Name:  Miguel Torres  MRN: [id number]  DOB: 1931-02-27  Age / Sex: 86 y.o., male         PCP: [name], MD                 Service Requesting Consult: IM/ Lorella Nimrod, MD                 Reason for Consult: ARF            History of Present Illness: Patient is a 86 y.o. male with medical problems of Hypertension, dyslipidemia, diabetes, chronic kidney disease, right BKA, atrial flutter requiring anticoagulation, who was admitted to [facility] on 07/25/2019 for evaluation of shortness of breath.  Patient is not able to provide details about his medical history Per notes, he was recently hospitalized in late February 2021 for CHF.  He is a nursing home resident and tested positive for COVID-19 virus there. Baseline creatinine of 1.67/GFR 36 from June 21, 2019 Nephrology consult has been requested for acute kidney injury Admission creatinine of 2.58 which has improved slightly to 2.33 today   Medications: Outpatient medications: Medications Prior to Admission  Medication Sig Dispense Refill Last Dose  . acetaminophen (TYLENOL) 325 MG tablet Take 2 tablets (650 mg total) by mouth every 6 (six) hours as needed for mild pain (or Fever >/= 101). (Patient taking differently: Take 650 mg by mouth every 6 (six) hours as needed for mild pain (general discomfort Max 3 grams/24hrs.). )   Unknown at PRN  . acetic acid 0.25 % irrigation Irrigate with 1 application as directed See admin instructions. Use 30 ml via irrigation every evening shift for decrease bacterial load of supra pubic catheter   As directed at Unknown  . apixaban (ELIQUIS) 2.5 MG TABS tablet Take 1 tablet (2.5 mg total) by mouth 2 (two) times daily. 60 tablet  07/25/2019 at 0730  . aspirin EC 81 MG EC tablet Take 1 tablet (81 mg total) by mouth daily.     . beta carotene w/minerals  (OCUVITE) tablet Take 1 tablet by mouth daily.   07/25/2019 at 0730  . bumetanide (BUMEX) 0.5 MG tablet Take 1 tablet (0.5 mg total) by mouth daily.   07/25/2019 at 0730  . Cholecalciferol (VITAMIN D3) 50000 units CAPS Take 50,000 Units by mouth every Sunday.    07/24/2019 at 0800  . docusate sodium (COLACE) 100 MG capsule Take 100 mg by mouth daily.   07/25/2019 at 0730  . felodipine (PLENDIL) 2.5 MG 24 hr tablet Take 7.5 mg by mouth daily.   07/25/2019 at 0730  . ferrous sulfate 325 (65 FE) MG tablet Take 325 mg by mouth daily.    07/25/2019 at 0730  . fexofenadine (ALLEGRA) 60 MG tablet Take 60 mg by mouth daily as needed for rhinitis (seasonal allergies.).    Unknown at PRN  . furosemide (LASIX) 40 MG tablet Take 40 mg by mouth daily.   07/25/2019 at 0730  . hydrALAZINE (APRESOLINE) 25 MG tablet Take 25 mg by mouth 3 (three) times daily.    07/25/2019 at 0730  . insulin aspart (NOVOLOG) 100 UNIT/ML injection Inject 0-9 Units into the skin 3 (three) times daily with meals. (Patient taking differently: Inject 3-9 Units into the skin 3 (three) times daily with meals.  Sliding Scale Insulin:150-200=3 units, 201-250=5 units, 251-300=7 units, 301-350=9 units) 10 mL 11 As directed at Unknown  . insulin detemir (LEVEMIR) 100 UNIT/ML injection Inject 32 Units into the skin at bedtime.   07/24/2019 at 2100  . LEVEMIR FLEXTOUCH 100 UNIT/ML Pen Inject 22 Units into the skin daily.    07/25/2019 at 0730  . lovastatin (MEVACOR) 40 MG tablet TAKE 1 TABLET (40 MG TOTAL) BY MOUTH DAILY. (Patient taking differently: Take 40 mg by mouth daily. ) 90 tablet 3   . metoprolol tartrate (LOPRESSOR) 25 MG tablet Take 25 mg by mouth 2 (two) times daily.   07/25/2019 at 0730    Current medications: Current Facility-Administered Medications  Medication Dose Route Frequency Provider Last Rate Last Admin  . 0.9 %  sodium chloride infusion  250 mL Intravenous PRN Agbata, Tochukwu, MD      . acetaminophen (TYLENOL) tablet 650 mg  650 mg  Oral Q6H PRN Agbata, Tochukwu, MD      . acetic acid 0.25 % irrigation 1 application  1 application Irrigation See admin instructions Agbata, Tochukwu, MD      . ascorbic acid (VITAMIN C) tablet 500 mg  500 mg Oral Daily Lorella Nimrod, MD   500 mg at 07/26/19 0910  . aspirin EC tablet 81 mg  81 mg Oral Daily Agbata, Tochukwu, MD   81 mg at 07/26/19 0926  . dexamethasone (DECADRON) tablet 6 mg  6 mg Oral Daily Agbata, Tochukwu, MD   6 mg at 07/26/19 0911  . docusate sodium (COLACE) capsule 100 mg  100 mg Oral Daily Agbata, Tochukwu, MD   100 mg at 07/26/19 0911  . felodipine (PLENDIL) 24 hr tablet 7.5 mg  7.5 mg Oral Daily Agbata, Tochukwu, MD   7.5 mg at 07/26/19 0931  . ferrous sulfate tablet 325 mg  325 mg Oral Daily Agbata, Tochukwu, MD   325 mg at 07/26/19 0911  . furosemide (LASIX) injection 80 mg  80 mg Intravenous Q12H Agbata, Tochukwu, MD   80 mg at 07/26/19 0540  . heparin ADULT infusion 100 units/mL (25000 units/214mL sodium chloride 0.45%)  1,200 Units/hr Intravenous Continuous Oswald Hillock, RPH 12 mL/hr at 07/26/19 0500 1,200 Units/hr at 07/26/19 0500  . hydrALAZINE (APRESOLINE) tablet 25 mg  25 mg Oral TID Collier Bullock, MD   25 mg at 07/26/19 0912  . insulin aspart (novoLOG) injection 0-6 Units  0-6 Units Subcutaneous TID WC Agbata, Tochukwu, MD      . insulin aspart (novoLOG) injection 2 Units  2 Units Subcutaneous TID WC Agbata, Tochukwu, MD      . loratadine (CLARITIN) tablet 10 mg  10 mg Oral Daily Agbata, Tochukwu, MD   10 mg at 07/26/19 0926  . ondansetron (ZOFRAN) injection 4 mg  4 mg Intravenous Q6H PRN Agbata, Tochukwu, MD      . pravastatin (PRAVACHOL) tablet 20 mg  20 mg Oral q1800 Agbata, Tochukwu, MD      . sodium chloride flush (NS) 0.9 % injection 3 mL  3 mL Intravenous Q12H Agbata, Tochukwu, MD      . sodium chloride flush (NS) 0.9 % injection 3 mL  3 mL Intravenous PRN Agbata, Tochukwu, MD      . Derrill Memo ON 07/31/2019] Vitamin D (Ergocalciferol) (DRISDOL) capsule  50,000 Units  50,000 Units Oral Q Sun Agbata, Tochukwu, MD      . zinc sulfate capsule 220 mg  220 mg Oral Daily Lorella Nimrod, MD   220 mg at 07/26/19  0911      Allergies: No Known Allergies    Past Medical History: Past Medical History:  Diagnosis Date  . Anemia   . CHF (congestive heart failure) ([place])   . Diabetes mellitus without complication ([place])   . Hyperlipidemia   . Hypertension   . PVD (peripheral vascular disease) ([place])   . Skin cancer      Past Surgical History: Past Surgical History:  Procedure Laterality Date  . BASAL CELL CARCINOMA EXCISION    . History of eye surgery Bilateral   . IR CATHETER TUBE CHANGE  03/01/2018  . LOWER EXTREMITY ANGIOGRAPHY Left 06/24/2019   Procedure: Lower Extremity Angiography;  Surgeon: Katha Cabal, MD;  Location: [facility];  Service: Cardiovascular;  Laterality: Left;  . MOHS SURGERY  04/2011   the top of his head  . Right BKA  09/12/2010   Dr. Delana Meyer; Secondary gangrenous right LE  . TONSILLECTOMY       Family History: Family History  Problem Relation Age of Onset  . Diabetes Mother   . Arthritis Sister   . Diabetes Daughter      Social History: Social History   Socioeconomic History  . Marital status: Married    Spouse name: Not on file  . Number of children: 5  . Years of education: 5th grade  . Highest education level: Not on file  Occupational History  . Occupation: Retired  Tobacco Use  . Smoking status: Never Smoker  . Smokeless tobacco: Never Used  Substance and Sexual Activity  . Alcohol use: No  . Drug use: No  . Sexual activity: Not Currently  Other Topics Concern  . Not on file  Social History Narrative   Lives at home by himself.  Ambulates with a walker   Has a  right prosthetic leg   Social Determinants of Health   Financial Resource Strain:   . Difficulty of Paying Living Expenses:   Food Insecurity:   . Worried About Charity fundraiser in the Last Year:   . Arts development officer in the Last Year:   Transportation Needs:   . Film/video editor (Medical):   [name] Lack of Transportation (Non-Medical):   Physical Activity:   . Days of Exercise per Week:   . Minutes of Exercise per Session:   Stress:   . Feeling of Stress :   Social Connections:   . Frequency of Communication with Friends and Family:   . Frequency of Social Gatherings with Friends and Family:   . Attends Religious Services:   . Active Member of Clubs or Organizations:   . Attends Archivist Meetings:   [name] Marital Status:   Intimate Partner Violence:   . Fear of Current or Ex-Partner:   . Emotionally Abused:   [name] Physically Abused:   . Sexually Abused:      Review of Systems: not reliable Gen:  HEENT:  CV:  Resp:  GI: GU :  MS:  Derm:    Psych: Heme:  Neuro:  Endocrine  Vital Signs: Blood pressure (!) 115/49, pulse 79, temperature 97.8 F (36.6 C), temperature source Axillary, resp. rate 18, height 5\' 8"  (1.727 m), weight 126.5 kg, SpO2 96 %.   Intake/Output Summary (Last 24 hours) at 07/26/2019 1045 Last data filed at 07/26/2019 0500 Gross per 24 hour  Intake 170.94 ml  Output 800 ml  Net -629.06 ml    Weight trends: [employer]   07/25/19 [id number]  07/26/19 0500  Weight: 98.9 kg 126.5 kg    Physical Exam: General:  Elderly gentleman, laying in the bed  HEENT  anicteric, dry oral mucous membranes  Neck:  Supple, no masses  Lungs:  [place] O2, limited exam, normal effort  Heart::  No rub  Abdomen:  Soft, mildly distended, scrotal edema  Extremities:  Rt BKA, Left leg wrapped, 2-3+ pitting edema  Neurologic:  Alert, able to answer few simple questions  Skin:  Warm, dry  Foley:  Supra pubic cathter       Lab results: Basic Metabolic Panel: Recent Labs  Lab 07/25/19 1325 07/26/19 0103  NA 129* 132*  K 4.0 4.1  CL 96* 99  CO2 23 24  GLUCOSE 112* 85  BUN 63* 64*  CREATININE 2.58* 2.33*  CALCIUM 7.8* 7.9*    Liver Function Tests: No  results for input(s): AST, ALT, ALKPHOS, BILITOT, PROT, ALBUMIN in the last 168 hours. No results for input(s): LIPASE, AMYLASE in the last 168 hours. No results for input(s): AMMONIA in the last 168 hours.  CBC: Recent Labs  Lab 07/25/19 1325 07/26/19 0103  WBC 19.0* 14.7*  NEUTROABS 16.9*  --   HGB 9.8* 9.9*  HCT 30.1* 30.5*  MCV 94.7 94.4  PLT 200 200    Cardiac Enzymes: No results for input(s): CKTOTAL, TROPONINI in the last 168 hours.  BNP: Invalid input(s): POCBNP  CBG: Recent Labs  Lab 07/25/19 1856 07/26/19 0107 07/26/19 0448 07/26/19 0748  GLUCAP 79 112* 94 86    Microbiology: Recent Results (from the past 720 hour(s))  SARS CORONAVIRUS 2 (TAT 6-24 HRS) Nasopharyngeal Nasopharyngeal Swab     Status: None   Collection Time: 06/30/19  2:43 PM   Specimen: Nasopharyngeal Swab  Result Value Ref Range Status   SARS Coronavirus 2 NEGATIVE NEGATIVE Final    Comment: (NOTE) SARS-CoV-2 target nucleic acids are NOT DETECTED. The SARS-CoV-2 RNA is generally detectable in upper and lower respiratory specimens during the acute phase of infection. Negative results do not preclude SARS-CoV-2 infection, do not rule out co-infections with other pathogens, and should not be used as the sole basis for treatment or other patient management decisions. Negative results must be combined with clinical observations, patient history, and epidemiological information. The expected result is Negative. Fact Sheet for Patients: SugarRoll.be Fact Sheet for Healthcare Providers: https://www.woods-mathews.com/ This test is not yet approved or cleared by the [place] FDA and  has been authorized for detection and/or diagnosis of SARS-CoV-2 by FDA under an Emergency Use Authorization (EUA). This EUA will remain  in effect (meaning this test can be used) for the duration of the COVID-19 declaration under Section 56 4(b)(1) of the Act, 21  U.S.C. section 360bbb-3(b)(1), unless the authorization is terminated or revoked sooner. Performed at [facility], [place] [address], [place], [place] [address]   SARS CORONAVIRUS 2 (TAT 6-24 HRS) Nasopharyngeal Nasopharyngeal Swab     Status: Abnormal   Collection Time: 07/22/19  1:05 PM   Specimen: Nasopharyngeal Swab  Result Value Ref Range Status   SARS Coronavirus 2 POSITIVE (A) NEGATIVE Final    Comment: (NOTE) SARS-CoV-2 target nucleic acids are DETECTED. The SARS-CoV-2 RNA is generally detectable in upper and lower respiratory specimens during the acute phase of infection. Positive results are indicative of the presence of SARS-CoV-2 RNA. Clinical correlation with patient history and other diagnostic information is  necessary to determine patient infection status. Positive results do not rule out bacterial infection or co-infection  with other viruses.  The expected result is Negative. Fact Sheet for Patients: SugarRoll.be Fact Sheet for Healthcare Providers: https://www.woods-mathews.com/ This test is not yet approved or cleared by the [place] FDA and  has been authorized for detection and/or diagnosis of SARS-CoV-2 by FDA under an Emergency Use Authorization (EUA). This EUA will remain  in effect (meaning this test can be used) for the duration of the COVID-19 declaration under Section 564(b)(1) of the Act, 21 U.S.C. se ction 360bbb-3(b)(1), unless the authorization is terminated or revoked sooner. Performed at [facility], [place] [address] [place], [place] [address]   Culture, blood (routine x 2)     Status: None (Preliminary result)   Collection Time: 07/25/19  3:49 PM   Specimen: BLOOD  Result Value Ref Range Status   Specimen Description BLOOD BLOOD RIGHT WRIST  Final   Special Requests   Final    BOTTLES DRAWN AEROBIC AND ANAEROBIC Blood Culture adequate volume   Culture   Final    NO GROWTH < 24  HOURS Performed at [facility], [address] [place], [place] [address]    Report Status PENDING  Incomplete  Culture, blood (routine x 2)     Status: None (Preliminary result)   Collection Time: 07/25/19  3:49 PM   Specimen: BLOOD  Result Value Ref Range Status   Specimen Description BLOOD BLOOD RIGHT ARM  Final   Special Requests   Final    BOTTLES DRAWN AEROBIC AND ANAEROBIC Blood Culture adequate volume   Culture   Final    NO GROWTH < 24 HOURS Performed at [facility], [address], [place], [place] [address]    Report Status PENDING  Incomplete     Coagulation Studies: Recent Labs    07/25/19 1707  LABPROT 24.6*  INR 2.2*    Urinalysis: Recent Labs    07/25/19 2104  COLORURINE YELLOW*  LABSPEC 1.011  PHURINE 9.0*  GLUCOSEU NEGATIVE  HGBUR NEGATIVE  BILIRUBINUR NEGATIVE  KETONESUR NEGATIVE  PROTEINUR 100*  NITRITE NEGATIVE  LEUKOCYTESUR MODERATE*        Imaging: DG Chest Portable 1 View  Result Date: 07/25/2019 CLINICAL DATA:  Chronic shortness of breath.  COVID positive. EXAM: PORTABLE CHEST 1 VIEW COMPARISON:  Chest x-ray dated June 27, 2019. FINDINGS: Unchanged mild cardiomegaly. Unchanged mild diffuse interstitial thickening and trace bilateral pleural effusions. No consolidation or pneumothorax. No acute osseous abnormality. IMPRESSION: 1. Unchanged mild congestive heart failure. No progressive interstitial thickening or focal opacity to suggest superimposed pneumonia. Electronically Signed   By: Titus Dubin M.D.   On: 07/25/2019 11:14      Assessment & Plan: Pt is a 86 y.o. caucasian  male with hypertension, atrial fibrillation, diabetes, chronic kidney disease, peripheral vascular disease, right BKA,, was admitted on 07/25/2019 with Elevated troponin [R77.8] Acute on chronic diastolic CHF (congestive heart failure) (HCC) [I50.33] COVID-19 virus infection [U07.1]    #Acute kidney injury on chronic kidney disease  stage IIIb Baseline creatinine of 1.67/GFR 36 from June 21, 2019 AKI likely secondary to hypotension from concurrent infection, aggressive diuresis -Hold IV bolus lasix -Hold antihypertensive felodipine.  May need to hold hydralazine if blood pressure remains low -Obtain serum albumin.  May need IV supplementation  #Lower extremity edema #Chronic systolic CHF, grade 1 diastolic dysfunction #Pulmonary hypertension -May need IV Lasix infusion and IV albumin supplementation    #COVID-19 virus infection  Dexamethasone, Treatment as per internal medicine team    LOS: 1  Barbara Ahart Candiss Norse 3/23/202110:45 AM    Note: This note was prepared with Dragon dictation. Any transcription errors are unintentional

## 2019-07-26 NOTE — Consult Note (Signed)
[facility] Consultation Note  Patient ID: Miguel Torres, MRN: [id number], DOB/AGE: [birth year] 86 y.o. Admit date: 07/25/2019   Date of Consult: 07/26/2019 Primary Physician: Miguel Sons, MD Primary Cardiologist: None  Chief Complaint:  Chief Complaint  Patient presents with  . positive covid   Reason for Consult: Atrial flutter heart failure  HPI: 86 y.o. male with known apparent of atrial fibrillation and/or atrial flutter with slow ventricular rate diabetes hyperlipidemia hypertension chronic kidney disease stage IV anemia with of peripheral vascular disease with right BKA who has had significant increase her shortness of breath cough and congestion as well as concerns of heart failure.  The patient was admitted to the emergency room and found to have atrial flutter with slow ventricular rate left axis deviation and right bundle branch block.  Telemetry has shown that the patient has some mild amount of sick sinus syndrome to up of to a 3.5-second pause.  The patient has had a recent echocardiogram showing normal LV systolic function with some valvular heart disease and pulmonary hypertension with ejection fraction of 50%.  The patient has been on anticoagulation for atrial flutter with some anemia with the hemoglobin of 9.8.  On this admission the patient has a BNP of 946 consistent with congestive heart failure and a troponin of 983 consistent with demand ischemia without evidence of acute coronary syndrome.  The patient's chest x-ray shows mild pulmonary edema.  The patient has had some improvements of symptoms with intravenous Lasix and other continued treatment.  Past Medical History:  Diagnosis Date  . Anemia   . CHF (congestive heart failure) ([place])   . Diabetes mellitus without complication ([place])   . Hyperlipidemia   . Hypertension   . PVD (peripheral vascular disease) ([place])   . Skin cancer       Surgical History:  Past Surgical History:  Procedure  Laterality Date  . BASAL CELL CARCINOMA EXCISION    . History of eye surgery Bilateral   . IR CATHETER TUBE CHANGE  03/01/2018  . LOWER EXTREMITY ANGIOGRAPHY Left 06/24/2019   Procedure: Lower Extremity Angiography;  Surgeon: Katha Cabal, MD;  Location: [facility];  Service: Cardiovascular;  Laterality: Left;  . MOHS SURGERY  04/2011   the top of his head  . Right BKA  09/12/2010   Dr. Delana Meyer; Secondary gangrenous right LE  . TONSILLECTOMY       Home Meds: Prior to Admission medications   Medication Sig Start Date End Date Taking? Authorizing Provider  acetaminophen (TYLENOL) 325 MG tablet Take 2 tablets (650 mg total) by mouth every 6 (six) hours as needed for mild pain (or Fever >/= 101). Patient taking differently: Take 650 mg by mouth every 6 (six) hours as needed for mild pain (general discomfort Max 3 grams/24hrs.).  12/09/17  Yes Gouru, Aruna, MD  acetic acid 0.25 % irrigation Irrigate with 1 application as directed See admin instructions. Use 30 ml via irrigation every evening shift for decrease bacterial load of supra pubic catheter   Yes [provider]  apixaban (ELIQUIS) 2.5 MG TABS tablet Take 1 tablet (2.5 mg total) by mouth 2 (two) times daily. 11/04/17  Yes Henreitta Leber, MD  aspirin EC 81 MG EC tablet Take 1 tablet (81 mg total) by mouth daily. 07/02/19  Yes Sreenath, Sudheer B, MD  beta carotene w/minerals (OCUVITE) tablet Take 1 tablet by mouth daily.   Yes [provider]  bumetanide (BUMEX) 0.5 MG tablet  Take 1 tablet (0.5 mg total) by mouth daily. 07/02/19  Yes Ralene Muskrat B, MD  Cholecalciferol (VITAMIN D3) 50000 units CAPS Take 50,000 Units by mouth every Sunday.    Yes [provider]  docusate sodium (COLACE) 100 MG capsule Take 100 mg by mouth daily.   Yes [provider]  felodipine (PLENDIL) 2.5 MG 24 hr tablet Take 7.5 mg by mouth daily.   Yes [provider]  ferrous sulfate 325 (65 FE) MG tablet  Take 325 mg by mouth daily.    Yes [provider]  fexofenadine (ALLEGRA) 60 MG tablet Take 60 mg by mouth daily as needed for rhinitis (seasonal allergies.).    Yes [provider]  furosemide (LASIX) 40 MG tablet Take 40 mg by mouth daily.   Yes [provider]  hydrALAZINE (APRESOLINE) 25 MG tablet Take 25 mg by mouth 3 (three) times daily.    Yes [provider]  insulin aspart (NOVOLOG) 100 UNIT/ML injection Inject 0-9 Units into the skin 3 (three) times daily with meals. Patient taking differently: Inject 3-9 Units into the skin 3 (three) times daily with meals. Sliding Scale Insulin:150-200=3 units, 201-250=5 units, 251-300=7 units, 301-350=9 units 12/21/17  Yes Epifanio Lesches, MD  insulin detemir (LEVEMIR) 100 UNIT/ML injection Inject 32 Units into the skin at bedtime.   Yes [provider]  LEVEMIR FLEXTOUCH 100 UNIT/ML Pen Inject 22 Units into the skin daily.    Yes [provider]  lovastatin (MEVACOR) 40 MG tablet TAKE 1 TABLET (40 MG TOTAL) BY MOUTH DAILY. Patient taking differently: Take 40 mg by mouth daily.  05/11/17  Yes Miguel Sons, MD  metoprolol tartrate (LOPRESSOR) 25 MG tablet Take 25 mg by mouth 2 (two) times daily.   Yes [provider]    Inpatient Medications:  . acetic acid  1 application Irrigation See admin instructions  . aspirin EC  81 mg Oral Daily  . dexamethasone  6 mg Oral Daily  . docusate sodium  100 mg Oral Daily  . felodipine  7.5 mg Oral Daily  . ferrous sulfate  325 mg Oral Daily  . furosemide  80 mg Intravenous Q12H  . hydrALAZINE  25 mg Oral TID  . insulin aspart  0-6 Units Subcutaneous TID WC  . insulin aspart  2 Units Subcutaneous TID WC  . loratadine  10 mg Oral Daily  . metoprolol tartrate  25 mg Oral BID  . pravastatin  20 mg Oral q1800  . sodium chloride flush  3 mL Intravenous Q12H  . [START ON 07/31/2019] Vitamin D (Ergocalciferol)  50,000 Units Oral Q Sun   . sodium  chloride    . heparin 1,200 Units/hr (07/26/19 0500)    Allergies: No Known Allergies  Social History   Socioeconomic History  . Marital status: Married    Spouse name: Not on file  . Number of children: 5  . Years of education: 5th grade  . Highest education level: Not on file  Occupational History  . Occupation: Retired  Tobacco Use  . Smoking status: Never Smoker  . Smokeless tobacco: Never Used  Substance and Sexual Activity  . Alcohol use: No  . Drug use: No  . Sexual activity: Not Currently  Other Topics Concern  . Not on file  Social History Narrative   Lives at home by himself.  Ambulates with a walker   Has a  right prosthetic leg   Social Determinants of Health   Financial  Resource Strain:   . Difficulty of Paying Living Expenses:   Food Insecurity:   . Worried About Charity fundraiser in the Last Year:   . Arboriculturist in the Last Year:   Transportation Needs:   . Film/video editor (Medical):   [name] Lack of Transportation (Non-Medical):   Physical Activity:   . Days of Exercise per Week:   . Minutes of Exercise per Session:   Stress:   . Feeling of Stress :   Social Connections:   . Frequency of Communication with Friends and Family:   . Frequency of Social Gatherings with Friends and Family:   . Attends Religious Services:   . Active Member of Clubs or Organizations:   . Attends Archivist Meetings:   [name] Marital Status:   Intimate Partner Violence:   . Fear of Current or Ex-Partner:   . Emotionally Abused:   [name] Physically Abused:   . Sexually Abused:      Family History  Problem Relation Age of Onset  . Diabetes Mother   . Arthritis Sister   . Diabetes Daughter      Review of Systems Positive for shortness of breath cough congestion Negative for: General:  chills, fever, night sweats or weight changes.  Cardiovascular: PND orthopnea syncope dizziness  Dermatological skin lesions rashes Respiratory: Positive for cough  congestion Urologic: Frequent urination urination at night and hematuria Abdominal: negative for nausea, vomiting, diarrhea, bright red blood per rectum, melena, or hematemesis Neurologic: negative for visual changes, and/or hearing changes  All other systems reviewed and are otherwise negative except as noted above.  Labs: No results for input(s): CKTOTAL, CKMB, TROPONINI in the last 72 hours. Lab Results  Component Value Date   WBC 14.7 (H) 07/26/2019   HGB 9.9 (L) 07/26/2019   HCT 30.5 (L) 07/26/2019   MCV 94.4 07/26/2019   PLT 200 07/26/2019    Recent Labs  Lab 07/26/19 0103  NA 132*  K 4.1  CL 99  CO2 24  BUN 64*  CREATININE 2.33*  CALCIUM 7.9*  GLUCOSE 85   Lab Results  Component Value Date   CHOL 90 11/01/2017   HDL 48 11/01/2017   LDLCALC 31 11/01/2017   TRIG 56 11/01/2017   No results found for: DDIMER  Radiology/Studies:  DG Chest Portable 1 View  Result Date: 07/25/2019 CLINICAL DATA:  Chronic shortness of breath.  COVID positive. EXAM: PORTABLE CHEST 1 VIEW COMPARISON:  Chest x-ray dated June 27, 2019. FINDINGS: Unchanged mild cardiomegaly. Unchanged mild diffuse interstitial thickening and trace bilateral pleural effusions. No consolidation or pneumothorax. No acute osseous abnormality. IMPRESSION: 1. Unchanged mild congestive heart failure. No progressive interstitial thickening or focal opacity to suggest superimposed pneumonia. Electronically Signed   By: Titus Dubin M.D.   On: 07/25/2019 11:14   DG Chest Port 1 View  Result Date: 06/27/2019 CLINICAL DATA:  Congestive heart failure. EXAM: PORTABLE CHEST 1 VIEW COMPARISON:  Chest x-ray dated June 21, 2019. FINDINGS: Unchanged mild cardiomegaly. Unchanged mild diffuse interstitial thickening and trace bilateral pleural effusions. No pneumothorax. No acute osseous abnormality. IMPRESSION: 1. Unchanged mild congestive heart failure. Electronically Signed   By: Titus Dubin M.D.   On: 06/27/2019  15:22   VAS [place] ABI WITH/WO TBI  Result Date: 07/25/2019 LOWER EXTREMITY DOPPLER STUDY Indications: Ulceration, and right AKA.  Limitations: Today's exam was limited due to patient in wheelchair, an open  wound and bandages. Comparison Study: Left leg angiogram on 06/24/19: 60-70% left popliteal artery                   stenosis, occluded anterior tibial artery and non-visualized                   posterior tibial and peroneal arteries Performing Technologist: Almira Coaster RVS Supporting Technologist: Blondell Reveal RT, RDMS, RVT  Examination Guidelines: A complete evaluation includes at minimum, Doppler waveform signals and systolic blood pressure reading at the level of bilateral brachial, anterior tibial, and posterior tibial arteries, when vessel segments are accessible. Bilateral testing is considered an integral part of a complete examination. Photoelectric Plethysmograph (PPG) waveforms and toe systolic pressure readings are included as required and additional duplex testing as needed. Limited examinations for reoccurring indications may be performed as noted.  ABI Findings: +--------+------------------+-----+--------+--------+ Right   Rt Pressure (mmHg)IndexWaveformComment  +--------+------------------+-----+--------+--------+ [id number]                                     +--------+------------------+-----+--------+--------+ +---------+------------------+-----+--------+-------+ Left     Lt Pressure (mmHg)IndexWaveformComment +---------+------------------+-----+--------+-------+ Brachial 230                                    +---------+------------------+-----+--------+-------+ Great Toe93                0.40 Abnormal        +---------+------------------+-----+--------+-------+ +-------+-----------+-----------+----------------+----------------+ ABI/TBIToday's ABIToday's TBIPrevious ABI    Previous TBI      +-------+-----------+-----------+----------------+----------------+ Left   Not done   0.40       Non-compressibleNon-compressible +-------+-----------+-----------+----------------+----------------+ Unable to adequately compare to the previous exam.  Summary: Left: The left toe-brachial index is abnormal. Unable to obtain ankle pressures and Doppler waveform due to numerous open wounds and leg bandaging. Patient also has a history of non-compressible vessels.  *See table(s) above for measurements and observations.  Electronically signed by Hortencia Pilar MD on 07/25/2019 at 11:55:27 AM.   Final     EKG: Atrial flutter with slow ventricular rate and left axis deviation with right bundle branch block  Weights: Filed Weights   07/25/19 0959 07/26/19 0500  Weight: 98.9 kg 126.5 kg     Physical Exam: Blood pressure (!) 115/49, pulse 79, temperature 97.6 F (36.4 C), temperature source Oral, resp. rate 18, height 5\' 8"  (1.727 m), weight 126.5 kg, SpO2 96 %. Body mass index is 42.4 kg/m. As per prime doc.    Assessment: 86 year old male with diabetes hypertension hyperlipidemia chronic kidney disease stage IV anemia peripheral vascular disease with a right BKA atrial flutter with slow ventricular rate with acute on chronic diastolic dysfunction congestive heart failure without evidence of myocardial infarction or acute coronary syndrome  Plan: 1.  Continue intravenous Lasix for further risk reduction and treatment of acute on chronic diastolic dysfunction heart failure 2.  No further cardiac intervention due to previous diagnostics and echocardiogram and no current evidence of acute coronary syndrome 3.  Possible discontinuation of metoprolol depending on heart rate control due to 3.5-second pause with some sick sinus syndrome and follow for improvements 4.  Further supportive care of recent infection cough congestion and pulmonary concerns  Signed, Corey Skains M.D. [facility] 07/26/2019, 8:11 AM

## 2019-07-27 LAB — COMPREHENSIVE METABOLIC PANEL
ALT: 28 U/L (ref 0–44)
AST: 35 U/L (ref 15–41)
Albumin: 2.3 g/dL — ABNORMAL LOW (ref 3.5–5.0)
Alkaline Phosphatase: 147 U/L — ABNORMAL HIGH (ref 38–126)
Anion gap: 9 (ref 5–15)
BUN: 75 mg/dL — ABNORMAL HIGH (ref 8–23)
CO2: 24 mmol/L (ref 22–32)
Calcium: 7.9 mg/dL — ABNORMAL LOW (ref 8.9–10.3)
Chloride: 101 mmol/L (ref 98–111)
Creatinine, Ser: 2.38 mg/dL — ABNORMAL HIGH (ref 0.61–1.24)
GFR calc Af Amer: 28 mL/min — ABNORMAL LOW (ref >60)
GFR calc non Af Amer: 24 mL/min — ABNORMAL LOW (ref >60)
Glucose, Bld: 250 mg/dL — ABNORMAL HIGH (ref 70–99)
Potassium: 4.2 mmol/L (ref 3.5–5.1)
Sodium: 134 mmol/L — ABNORMAL LOW (ref 135–145)
Total Bilirubin: 0.6 mg/dL (ref 0.3–1.2)
Total Protein: 6.4 g/dL — ABNORMAL LOW (ref 6.5–8.1)

## 2019-07-27 LAB — CBC WITH DIFFERENTIAL/PLATELET
Abs Immature Granulocytes: 0.43 10*3/uL — ABNORMAL HIGH (ref 0.00–0.07)
Basophils Absolute: 0.1 10*3/uL (ref 0.0–0.1)
Basophils Relative: 1 %
Eosinophils Absolute: 0 10*3/uL (ref 0.0–0.5)
Eosinophils Relative: 0 %
HCT: 28.2 % — ABNORMAL LOW (ref 39.0–52.0)
Hemoglobin: 9.1 g/dL — ABNORMAL LOW (ref 13.0–17.0)
Immature Granulocytes: 4 %
Lymphocytes Relative: 5 %
Lymphs Abs: 0.5 10*3/uL — ABNORMAL LOW (ref 0.7–4.0)
MCH: 30.7 pg (ref 26.0–34.0)
MCHC: 32.3 g/dL (ref 30.0–36.0)
MCV: 95.3 fL (ref 80.0–100.0)
Monocytes Absolute: 1.2 10*3/uL — ABNORMAL HIGH (ref 0.1–1.0)
Monocytes Relative: 12 %
Neutro Abs: 8.3 10*3/uL — ABNORMAL HIGH (ref 1.7–7.7)
Neutrophils Relative %: 78 %
Platelets: 209 10*3/uL (ref 150–400)
RBC: 2.96 MIL/uL — ABNORMAL LOW (ref 4.22–5.81)
RDW: 13.7 % (ref 11.5–15.5)
WBC: 10.6 10*3/uL — ABNORMAL HIGH (ref 4.0–10.5)
nRBC: 0 % (ref 0.0–0.2)

## 2019-07-27 LAB — GLUCOSE, CAPILLARY
Glucose-Capillary: 212 mg/dL — ABNORMAL HIGH (ref 70–99)
Glucose-Capillary: 194 mg/dL — ABNORMAL HIGH (ref 70–99)
Glucose-Capillary: 170 mg/dL — ABNORMAL HIGH (ref 70–99)

## 2019-07-27 LAB — PROCALCITONIN: Procalcitonin: 0.52 ng/mL

## 2019-07-27 LAB — PHOSPHORUS: Phosphorus: 5.3 mg/dL — ABNORMAL HIGH (ref 2.5–4.6)

## 2019-07-27 LAB — C-REACTIVE PROTEIN: CRP: 14.7 mg/dL — ABNORMAL HIGH (ref <1.0)

## 2019-07-27 LAB — FIBRIN DERIVATIVES D-DIMER (ARMC ONLY): Fibrin derivatives D-dimer (ARMC): 4063.98 ng/mL (FEU) — ABNORMAL HIGH (ref 0.00–499.00)

## 2019-07-27 LAB — MAGNESIUM: Magnesium: 2.3 mg/dL (ref 1.7–2.4)

## 2019-07-27 MED ORDER — SODIUM CHLORIDE 0.9 % IV SOLN
1.0000 g | INTRAVENOUS | Status: DC
  Administered 2019-07-27 – 2019-07-28 (×2): 1 g via INTRAVENOUS
  Filled 2019-07-27: qty 10
  Filled 2019-07-27: qty 1

## 2019-07-27 MED ORDER — CITRIC ACID-D GLUCONIC ACID IR SOLN
30.0000 mL | Freq: Every evening | Status: DC
  Administered 2019-07-27 – 2019-07-28 (×2): 30 mL
  Filled 2019-07-27 (×10): qty 30

## 2019-07-27 MED ORDER — ALBUMIN HUMAN 25 % IV SOLN
12.5000 g | Freq: Three times a day (TID) | INTRAVENOUS | Status: DC
  Administered 2019-07-27 – 2019-08-06 (×29): 12.5 g via INTRAVENOUS
  Filled 2019-07-27 (×33): qty 50

## 2019-07-27 NOTE — Progress Notes (Signed)
PROGRESS NOTE    Miguel Torres  FOY:[id number] DOB: February 11, 1931 DOA: 07/25/2019 PCP: [name], MD   Brief Narrative:  Miguel Torres is a 86 y.o. male with medical history significant for hypertension, dyslipidemia, diabetes mellitus with complications of stage IV chronic kidney disease, history of peripheral vascular disease status post right BKA, history of atrial flutter on Eliquis and iron deficiency anemia.  He was sent to the emergency room for evaluation of shortness of breath. Patient was recently discharged from the hospital on 07/01/19 after hospitalization for CHF. He recently tested  positive for COVID 19 virus following routine testing at the SNF where he resides. Due to increased work of breathing EMS was called and he was brought to the ER. He was found to have a pulse oximetry of 90% on room air and was placed on 2L of oxygen. He has a new cough but denies having any fevers or chills.  Subjective: Patient appears lethargic and sleeping.  He was easily arousable and following some commands.  Did not communicated.  Per nursing staff poor p.o. intake.  Assessment & Plan:   Principal Problem:   Acute on chronic diastolic CHF (congestive heart failure) (HCC) Active Problems:   Anemia of chronic disease   CKD (chronic kidney disease) stage 4, GFR 15-29 ml/min (HCC)   Diabetes mellitus with nephropathy (HCC)   Atrial flutter (HCC)   PAD (peripheral artery disease) ([place])   COVID-19 virus infection   NSTEMI (non-ST elevated myocardial infarction) (HCC)  Acute on chronic diastolic heart failure.  Patient appears volume overload. Hypoxia which improves with supplemental oxygen.  He was initially diuresed with IV Lasix and then switched to Lasix infusion for gentle diuresis by nephrology. Cardiology was consulted-they do not recommend any further work-up. -Continue diuresis with Lasix gtt. -Monitor BMP. -Strict intake and output. -Daily weight. -Palliative care  consult as patient has poor prognosis with multiple comorbidities.  They started the discussion about goals of care with his son.  Currently he will remain full scope of medical care.  COVID-19 infection.  Patient with mild hypoxia.  Chest x-ray with mild congestive heart failure no infiltrate.  Markedly elevated inflammatory markers, started improving.  Negative blood cultures.  UA looked infected. Mild hypothermia with positive leukocytosis. - urine culture-gram-negative rods. -Check PCT- 0.52 -Start him on remdesivir-2/5 -Continue Decadron for 10 days.3/10. -Continue to monitor inflammatory markers. -Oxygen supplement as needed to keep saturation above 90%.  UTI.  Urine culture with gram-negative rods. -Start him on ceftriaxone-we will de-escalate once more results available.  Elevated troponin.  Most likely secondary to demand.  Denies any chest pain. Trending down.  He was started on heparin infusion on admission and it was discontinued after 24-hour. -Continue to monitor.  Diabetes mellitus with stage 4 CKD. -Continue with SSI and sensitive mealtime coverage. -Might need increase in insulin as patient is on steroid now.  AKI with CKD stage IV.  Creatinine elevated at 2.58 on admission which improved to 2.38 today.  Baseline of 1.7-1.8. -Nephrology was consulted and they are recommending Lasix infusion and IV albumin. -Continue to monitor. -Avoid nephrotoxins  History of Atrial Flutter.  Currently rate controlled. -Continue home dose of apixaban.  Peripheral arterial disease.  S/p right BKA Patient has an ulcer on left leg.  Lower extremity ultrasound with abnormal toe brachial index. -Vascular surgery was consulted-they do not have much to offer at this time. -Wound care consult.  Objective: Vitals:   07/27/19 1000 07/27/19 1131 07/27/19  1400 07/27/19 1543  BP: (!) 143/76  (!) 143/81   Pulse: (!) 114 (!) 112 (!) 114   Resp: 18 20 19 18   Temp:      TempSrc:      SpO2:  (!) 88% 95% 97%   Weight:      Height:        Intake/Output Summary (Last 24 hours) at 07/27/2019 1559 Last data filed at 07/27/2019 1116 Gross per 24 hour  Intake 530.16 ml  Output 550 ml  Net -19.84 ml   Filed Weights   07/25/19 0959 07/26/19 0500 07/27/19 0500  Weight: 98.9 kg 126.5 kg 113.8 kg    Examination:  General exam: Chronically ill-appearing elderly man, appears lethargic but comfortable  Respiratory system: Clear to auscultation with decreased breath sounds at bases, Respiratory effort normal. Cardiovascular system: S1 & S2 heard, RRR. No JVD, murmurs, rubs, gallops or clicks. Gastrointestinal system: Soft, nontender, nondistended, bowel sounds positive. Central nervous system: Is lethargic. No focal neurological deficits. Extremities: Right BKA, left lower extremity with Unna boot. Skin: Multiple skin lesions all over. Psychiatry: Judgement and insight appear impaired.  DVT prophylaxis: Eliquis Code Status: Full Family Communication: Son was updated on phone Disposition Plan: Pending improvement.  Currently patient is on remdesivir and Lasix infusion.  High risk for deterioration due to multiple comorbidities.  Palliative care was consulted.  Consultants:   Cardiology  Nephrology  Palliative care  Vascular surgery  Procedures:  Antimicrobials:   Data Reviewed: I have personally reviewed following labs and imaging studies  CBC: Recent Labs  Lab 07/25/19 1325 07/26/19 0103 07/27/19 0550  WBC 19.0* 14.7* 10.6*  NEUTROABS 16.9*  --  8.3*  HGB 9.8* 9.9* 9.1*  HCT 30.1* 30.5* 28.2*  MCV 94.7 94.4 95.3  PLT 200 200 545   Basic Metabolic Panel: Recent Labs  Lab 07/25/19 1325 07/26/19 0103 07/27/19 0550  NA 129* 132* 134*  K 4.0 4.1 4.2  CL 96* 99 101  CO2 23 24 24   GLUCOSE 112* 85 250*  BUN 63* 64* 75*  CREATININE 2.58* 2.33* 2.38*  CALCIUM 7.8* 7.9* 7.9*  MG  --   --  2.3  PHOS  --   --  5.3*   GFR: Estimated Creatinine Clearance:  27.3 mL/min (A) (by C-G formula based on SCr of 2.38 mg/dL (H)). Liver Function Tests: Recent Labs  Lab 07/26/19 0827 07/27/19 0550  AST  --  35  ALT  --  28  ALKPHOS  --  147*  BILITOT  --  0.6  PROT  --  6.4*  ALBUMIN 2.4* 2.3*   No results for input(s): LIPASE, AMYLASE in the last 168 hours. No results for input(s): AMMONIA in the last 168 hours. Coagulation Profile: Recent Labs  Lab 07/25/19 1707  INR 2.2*   Cardiac Enzymes: No results for input(s): CKTOTAL, CKMB, CKMBINDEX, TROPONINI in the last 168 hours. BNP (last 3 results) No results for input(s): PROBNP in the last 8760 hours. HbA1C: No results for input(s): HGBA1C in the last 72 hours. CBG: Recent Labs  Lab 07/26/19 0748 07/26/19 1140 07/26/19 1631 07/27/19 0830 07/27/19 1139  GLUCAP 86 158* 196* 212* 194*   Lipid Profile: No results for input(s): CHOL, HDL, LDLCALC, TRIG, CHOLHDL, LDLDIRECT in the last 72 hours. Thyroid Function Tests: No results for input(s): TSH, T4TOTAL, FREET4, T3FREE, THYROIDAB in the last 72 hours. Anemia Panel: Recent Labs    07/25/19 1707  FERRITIN 36   Sepsis Labs: Recent Labs  Lab  07/25/19 1328 07/26/19 1622 07/27/19 0550  PROCALCITON  --  0.55 0.52  LATICACIDVEN 1.0  --   --     Recent Results (from the past 240 hour(s))  SARS CORONAVIRUS 2 (TAT 6-24 HRS) Nasopharyngeal Nasopharyngeal Swab     Status: Abnormal   Collection Time: 07/22/19  1:05 PM   Specimen: Nasopharyngeal Swab  Result Value Ref Range Status   SARS Coronavirus 2 POSITIVE (A) NEGATIVE Final    Comment: (NOTE) SARS-CoV-2 target nucleic acids are DETECTED. The SARS-CoV-2 RNA is generally detectable in upper and lower respiratory specimens during the acute phase of infection. Positive results are indicative of the presence of SARS-CoV-2 RNA. Clinical correlation with patient history and other diagnostic information is  necessary to determine patient infection status. Positive results do not rule  out bacterial infection or co-infection with other viruses.  The expected result is Negative. Fact Sheet for Patients: SugarRoll.be Fact Sheet for Healthcare Providers: https://www.woods-mathews.com/ This test is not yet approved or cleared by the [place] FDA and  has been authorized for detection and/or diagnosis of SARS-CoV-2 by FDA under an Emergency Use Authorization (EUA). This EUA will remain  in effect (meaning this test can be used) for the duration of the COVID-19 declaration under Section 564(b)(1) of the Act, 21 U.S.C. se ction 360bbb-3(b)(1), unless the authorization is terminated or revoked sooner. Performed at [facility], [place] [address], [place], [place] [address]   Urine Culture     Status: Abnormal (Preliminary result)   Collection Time: 07/25/19  8:11 AM   Specimen: Urine, Random  Result Value Ref Range Status   Specimen Description   Final    URINE, RANDOM Performed at [facility], [address] [place], [place] [address]    Special Requests   Final    NONE Performed at [facility], [address] [place], [place] [address]    Culture >=100,000 COLONIES/mL PROTEUS MIRABILIS (A)  Final   Report Status PENDING  Incomplete  Culture, blood (routine x 2)     Status: None (Preliminary result)   Collection Time: 07/25/19  3:49 PM   Specimen: BLOOD  Result Value Ref Range Status   Specimen Description BLOOD BLOOD RIGHT WRIST  Final   Special Requests   Final    BOTTLES DRAWN AEROBIC AND ANAEROBIC Blood Culture adequate volume   Culture   Final    NO GROWTH 2 DAYS Performed at [facility], [address], [place], [place] [address]    Report Status PENDING  Incomplete  Culture, blood (routine x 2)     Status: None (Preliminary result)   Collection Time: 07/25/19  3:49 PM   Specimen: BLOOD  Result Value Ref Range Status   Specimen Description BLOOD BLOOD RIGHT ARM  Final    Special Requests   Final    BOTTLES DRAWN AEROBIC AND ANAEROBIC Blood Culture adequate volume   Culture   Final    NO GROWTH 2 DAYS Performed at [facility], [address], [place], [place] [address]    Report Status PENDING  Incomplete     Radiology Studies: No results found.  Scheduled Meds: . apixaban  2.5 mg Oral BID  . vitamin C  500 mg Oral Daily  . aspirin EC  81 mg Oral Daily  . dexamethasone  6 mg Oral Daily  . docusate sodium  100 mg Oral Daily  . ferrous sulfate  325 mg Oral Daily  . gluconic acid-citric  acid  30 mL Irrigation QPM  . insulin aspart  0-6 Units Subcutaneous TID WC  . insulin aspart  2 Units Subcutaneous TID WC  . Ipratropium-Albuterol  1 puff Inhalation Q6H  . loratadine  10 mg Oral Daily  . pravastatin  20 mg Oral q1800  . sodium chloride flush  3 mL Intravenous Q12H  . [START ON 07/31/2019] Vitamin D (Ergocalciferol)  50,000 Units Oral Q Sun  . zinc sulfate  220 mg Oral Daily   Continuous Infusions: . sodium chloride    . albumin human    . cefTRIAXone (ROCEPHIN)  IV Stopped (07/27/19 0910)  . furosemide (LASIX) infusion 10 mg/hr (07/27/19 1116)  . remdesivir 100 mg in NS 100 mL Stopped (07/27/19 1113)     LOS: 2 days   Time spent: 40 minutes.  Lorella Nimrod, MD [facility]  If 7PM-7AM, please contact night-coverage [email or web address]  07/27/2019, 3:59 PM   This record has been created using Systems analyst. Errors have been sought and corrected,but may not always be located. Such creation errors do not reflect on the standard of care.

## 2019-07-27 NOTE — Progress Notes (Signed)
[address] [place], [place] [address] Phone [phone]. Fax [phone]  Date: 07/27/2019                  Patient Name:  Miguel Torres  MRN: [id number]  DOB: 1931/04/30  Age / Sex: 86 y.o., male         PCP: [name], MD                 Service Requesting Consult: IM/ Lorella Nimrod, MD                 Reason for Consult: ARF            History of Present Illness: Patient is a 86 y.o. male with medical problems of Hypertension, dyslipidemia, diabetes, chronic kidney disease, right BKA, atrial flutter requiring anticoagulation, who was admitted to [facility] on 07/25/2019 for evaluation of shortness of breath.  Patient is not able to provide details about his medical history Per notes, he was recently hospitalized in late February 2021 for CHF.  He is a nursing home resident and tested positive for COVID-19 virus there. Baseline creatinine of 1.67/GFR 36 from June 21, 2019 Nephrology consult has been requested for acute kidney injury  Hospital course: Antihypertensives were discontinued and IV Lasix was changed to IV furosemide infusion.  Blood pressure is higher and better controlled.  Patient although remains poorly responsive.  Did not interact much today.     Current medications: Current Facility-Administered Medications  Medication Dose Route Frequency Provider Last Rate Last Admin  . 0.9 %  sodium chloride infusion  250 mL Intravenous PRN Agbata, Tochukwu, MD      . acetaminophen (TYLENOL) tablet 650 mg  650 mg Oral Q6H PRN Agbata, Tochukwu, MD      . albumin human 25 % solution 12.5 g  12.5 g Intravenous QHS Murlean Iba, MD   Stopped at 07/26/19 2331  . apixaban (ELIQUIS) tablet 2.5 mg  2.5 mg Oral BID Lorella Nimrod, MD   2.5 mg at 07/27/19 1245  . ascorbic acid (VITAMIN C) tablet 500 mg  500 mg Oral Daily Lorella Nimrod, MD   500 mg at 07/27/19 0834  . aspirin EC tablet 81 mg  81 mg Oral Daily Agbata, Tochukwu, MD   81 mg at 07/27/19 0834   . cefTRIAXone (ROCEPHIN) 1 g in sodium chloride 0.9 % 100 mL IVPB  1 g Intravenous Q24H Lorella Nimrod, MD   Stopped at 07/27/19 0910  . chlorpheniramine-HYDROcodone (TUSSIONEX) 10-8 MG/5ML suspension 5 mL  5 mL Oral Q12H PRN Lorella Nimrod, MD      . dexamethasone (DECADRON) tablet 6 mg  6 mg Oral Daily Agbata, Tochukwu, MD   6 mg at 07/27/19 0834  . docusate sodium (COLACE) capsule 100 mg  100 mg Oral Daily Agbata, Tochukwu, MD   100 mg at 07/27/19 0833  . ferrous sulfate tablet 325 mg  325 mg Oral Daily Agbata, Tochukwu, MD   325 mg at 07/27/19 0834  . furosemide (LASIX) 250 mg in dextrose 5 % 250 mL (1 mg/mL) infusion  4 mg/hr Intravenous Continuous Murlean Iba, MD 10 mL/hr at 07/27/19 1116 10 mg/hr at 07/27/19 1116  . gluconic acid-citric acid (RENACIDIN) irrigation 30 mL  30 mL Irrigation QPM Lang Snow, NP   30 mL at 07/27/19 0626  . guaiFENesin-dextromethorphan (ROBITUSSIN DM) 100-10 MG/5ML syrup 10 mL  10 mL Oral Q4H PRN Lorella Nimrod, MD      . hydrALAZINE (APRESOLINE)  tablet 25 mg  25 mg Oral TID Collier Bullock, MD   25 mg at 07/27/19 1696  . insulin aspart (novoLOG) injection 0-6 Units  0-6 Units Subcutaneous TID WC Agbata, Tochukwu, MD   1 Units at 07/27/19 1233  . insulin aspart (novoLOG) injection 2 Units  2 Units Subcutaneous TID WC Agbata, Tochukwu, MD   2 Units at 07/27/19 1232  . Ipratropium-Albuterol (COMBIVENT) respimat 1 puff  1 puff Inhalation Q6H Lorella Nimrod, MD   1 puff at 07/27/19 1044  . loratadine (CLARITIN) tablet 10 mg  10 mg Oral Daily Agbata, Tochukwu, MD   10 mg at 07/27/19 0834  . ondansetron (ZOFRAN) injection 4 mg  4 mg Intravenous Q6H PRN Agbata, Tochukwu, MD      . pravastatin (PRAVACHOL) tablet 20 mg  20 mg Oral q1800 Agbata, Tochukwu, MD   20 mg at 07/26/19 2252  . remdesivir 100 mg in sodium chloride 0.9 % 100 mL IVPB  100 mg Intravenous Daily Lorella Nimrod, MD   Stopped at 07/27/19 1113  . sodium chloride flush (NS) 0.9 % injection 3 mL  3  mL Intravenous Q12H Agbata, Tochukwu, MD   3 mL at 07/27/19 0836  . sodium chloride flush (NS) 0.9 % injection 3 mL  3 mL Intravenous PRN Agbata, Tochukwu, MD      . Derrill Memo ON 07/31/2019] Vitamin D (Ergocalciferol) (DRISDOL) capsule 50,000 Units  50,000 Units Oral Q Sun Agbata, Tochukwu, MD      . zinc sulfate capsule 220 mg  220 mg Oral Daily Lorella Nimrod, MD   220 mg at 07/27/19 0834     Vital Signs: Blood pressure (!) 143/76, pulse (!) 112, temperature (!) 97.5 F (36.4 C), temperature source Oral, resp. rate 20, height 5\' 8"  (1.727 m), weight 113.8 kg, SpO2 95 %.   Intake/Output Summary (Last 24 hours) at 07/27/2019 1414 Last data filed at 07/27/2019 1116 Gross per 24 hour  Intake 530.16 ml  Output 550 ml  Net -19.84 ml    Weight trends: Filed Weights   07/25/19 0959 07/26/19 0500 07/27/19 0500  Weight: 98.9 kg 126.5 kg 113.8 kg    Physical Exam: General:  Elderly gentleman, laying in the bed  HEENT  anicteric, dry oral mucous membranes  Neck:  Supple, no masses  Lungs:  [place] O2, limited exam, normal effort  Heart::  No rub  Abdomen:  Soft,distended, scrotal edema, mild midepigastric diffuse tenderness  Extremities:  Rt BKA, Left leg wrapped, 2-3+ pitting edema  Neurologic:  Lethargic, minimally interactive  Skin:  Warm, dry  Foley:  Supra pubic cathter       Lab results: Basic Metabolic Panel: Recent Labs  Lab 07/25/19 1325 07/26/19 0103 07/27/19 0550  NA 129* 132* 134*  K 4.0 4.1 4.2  CL 96* 99 101  CO2 23 24 24   GLUCOSE 112* 85 250*  BUN 63* 64* 75*  CREATININE 2.58* 2.33* 2.38*  CALCIUM 7.8* 7.9* 7.9*  MG  --   --  2.3  PHOS  --   --  5.3*    Liver Function Tests: Recent Labs  Lab 07/27/19 0550  AST 35  ALT 28  ALKPHOS 147*  BILITOT 0.6  PROT 6.4*  ALBUMIN 2.3*   No results for input(s): LIPASE, AMYLASE in the last 168 hours. No results for input(s): AMMONIA in the last 168 hours.  CBC: Recent Labs  Lab 07/25/19 1325 07/25/19 1325  07/26/19 0103 07/27/19 0550  WBC 19.0*   < >  14.7* 10.6*  NEUTROABS 16.9*  --   --  8.3*  HGB 9.8*   < > 9.9* 9.1*  HCT 30.1*   < > 30.5* 28.2*  MCV 94.7   < > 94.4 95.3  PLT 200   < > 200 209   < > = values in this interval not displayed.    Cardiac Enzymes: No results for input(s): CKTOTAL, TROPONINI in the last 168 hours.  BNP: Invalid input(s): POCBNP  CBG: Recent Labs  Lab 07/26/19 0748 07/26/19 1140 07/26/19 1631 07/27/19 0830 07/27/19 1139  GLUCAP 86 158* 196* 212* 194*    Microbiology: Recent Results (from the past 720 hour(s))  SARS CORONAVIRUS 2 (TAT 6-24 HRS) Nasopharyngeal Nasopharyngeal Swab     Status: None   Collection Time: 06/30/19  2:43 PM   Specimen: Nasopharyngeal Swab  Result Value Ref Range Status   SARS Coronavirus 2 NEGATIVE NEGATIVE Final    Comment: (NOTE) SARS-CoV-2 target nucleic acids are NOT DETECTED. The SARS-CoV-2 RNA is generally detectable in upper and lower respiratory specimens during the acute phase of infection. Negative results do not preclude SARS-CoV-2 infection, do not rule out co-infections with other pathogens, and should not be used as the sole basis for treatment or other patient management decisions. Negative results must be combined with clinical observations, patient history, and epidemiological information. The expected result is Negative. Fact Sheet for Patients: SugarRoll.be Fact Sheet for Healthcare Providers: https://www.woods-mathews.com/ This test is not yet approved or cleared by the [place] FDA and  has been authorized for detection and/or diagnosis of SARS-CoV-2 by FDA under an Emergency Use Authorization (EUA). This EUA will remain  in effect (meaning this test can be used) for the duration of the COVID-19 declaration under Section 56 4(b)(1) of the Act, 21 U.S.C. section 360bbb-3(b)(1), unless the authorization is terminated or revoked sooner. Performed  at [facility], [place] [address] [place], [place] [address]   SARS CORONAVIRUS 2 (TAT 6-24 HRS) Nasopharyngeal Nasopharyngeal Swab     Status: Abnormal   Collection Time: 07/22/19  1:05 PM   Specimen: Nasopharyngeal Swab  Result Value Ref Range Status   SARS Coronavirus 2 POSITIVE (A) NEGATIVE Final    Comment: (NOTE) SARS-CoV-2 target nucleic acids are DETECTED. The SARS-CoV-2 RNA is generally detectable in upper and lower respiratory specimens during the acute phase of infection. Positive results are indicative of the presence of SARS-CoV-2 RNA. Clinical correlation with patient history and other diagnostic information is  necessary to determine patient infection status. Positive results do not rule out bacterial infection or co-infection with other viruses.  The expected result is Negative. Fact Sheet for Patients: SugarRoll.be Fact Sheet for Healthcare Providers: https://www.woods-mathews.com/ This test is not yet approved or cleared by the [place] FDA and  has been authorized for detection and/or diagnosis of SARS-CoV-2 by FDA under an Emergency Use Authorization (EUA). This EUA will remain  in effect (meaning this test can be used) for the duration of the COVID-19 declaration under Section 564(b)(1) of the Act, 21 U.S.C. se ction 360bbb-3(b)(1), unless the authorization is terminated or revoked sooner. Performed at [facility], [place] [address] [place], [place] [address]   Urine Culture     Status: Abnormal (Preliminary result)   Collection Time: 07/25/19  8:11 AM   Specimen: Urine, Random  Result Value Ref Range Status   Specimen Description   Final    URINE, RANDOM Performed at [facility], [place],  [place] [address]    Special Requests   Final    NONE Performed at [facility], [address] [place], [place] [address]    Culture >=100,000 COLONIES/mL PROTEUS MIRABILIS  (A)  Final   Report Status PENDING  Incomplete  Culture, blood (routine x 2)     Status: None (Preliminary result)   Collection Time: 07/25/19  3:49 PM   Specimen: BLOOD  Result Value Ref Range Status   Specimen Description BLOOD BLOOD RIGHT WRIST  Final   Special Requests   Final    BOTTLES DRAWN AEROBIC AND ANAEROBIC Blood Culture adequate volume   Culture   Final    NO GROWTH 2 DAYS Performed at [facility], [address] [place], [place] [address]    Report Status PENDING  Incomplete  Culture, blood (routine x 2)     Status: None (Preliminary result)   Collection Time: 07/25/19  3:49 PM   Specimen: BLOOD  Result Value Ref Range Status   Specimen Description BLOOD BLOOD RIGHT ARM  Final   Special Requests   Final    BOTTLES DRAWN AEROBIC AND ANAEROBIC Blood Culture adequate volume   Culture   Final    NO GROWTH 2 DAYS Performed at [facility], [address], [place], [place] [address]    Report Status PENDING  Incomplete     Coagulation Studies: Recent Labs    07/25/19 1707  LABPROT 24.6*  INR 2.2*    Urinalysis: Recent Labs    07/25/19 2104  COLORURINE YELLOW*  LABSPEC 1.011  PHURINE 9.0*  GLUCOSEU NEGATIVE  HGBUR NEGATIVE  BILIRUBINUR NEGATIVE  KETONESUR NEGATIVE  PROTEINUR 100*  NITRITE NEGATIVE  LEUKOCYTESUR MODERATE*        Imaging: No results found.   Assessment & Plan: Pt is a 86 y.o. caucasian  male with hypertension, atrial fibrillation, diabetes, chronic kidney disease, peripheral vascular disease, right BKA,, was admitted on 07/25/2019 with Elevated troponin [R77.8] Acute on chronic diastolic CHF (congestive heart failure) (HCC) [I50.33] COVID-19 virus infection [U07.1]    #Acute kidney injury on chronic kidney disease stage IIIb Baseline creatinine of 1.67/GFR 36 from June 21, 2019 AKI likely secondary to hypotension from concurrent infection, aggressive diuresis -Hold IV bolus lasix -Hold  antihypertensives -Increase albumin dose to 3 times per day -Continue IV Lasix infusion  #Lower extremity edema #Chronic systolic CHF, grade 1 diastolic dysfunction #Pulmonary hypertension -IV Lasix infusion and IV albumin supplementation    #COVID-19 virus infection  Dexamethasone, Treatment as per internal medicine team  With underlying multiple chronic medical conditions, overall prognosis appears poor.  Reviewed palliative care team notes.  Agree with DNR status.   LOS: 2 Cedrik Heindl 3/24/20212:14 PM    Note: This note was prepared with Dragon dictation. Any transcription errors are unintentional

## 2019-07-27 NOTE — Consult Note (Addendum)
Consultation Note Date: 07/27/2019   Patient Name: Miguel Torres  DOB: 07/09/1930  MRN: [id number]  Age / Sex: 86 y.o., male  PCP: [name], MD Referring Physician: Lorella Nimrod, MD  Reason for Consultation: Establishing goals of care  HPI/Patient Profile: Miguel Torres is a 86 y.o. male with medical history significant for hypertension, dyslipidemia, diabetes mellitus with complications of stage IV chronic kidney disease, history of peripheral vascular disease status post right BKA, history of atrial flutter on Eliquis and iron deficiency anemia.  He was sent to the emergency room for evaluation of shortness of breath. Patient was recently discharged from the hospital on 07/01/19 after hospitalization for CHF. He recently tested  positive for COVID 19 virus following routine testing at the SNF where he resides.  Clinical Assessment and Goals of Care: Patient is on covid isolation. Patient did not answer phone when called. Per nursing, he is confused and has minimal oral intake.   Spoke with his son [name] who is the emergency contact. [name] states there are 3 children, but he is first HPOA, and his sister is 2nd. He states the patient's wife is still living, but has been living in a nursing home for the past 4-5 years. He states she and the patient share a room, and he has lived there about 1.5 years.   He discusses that around 1.5 years ago, he developed a foot infection and CHF around the same time. He states his father began to decline at this point. Before he used a walker, now he does not have the upper body strength to use one. He states his father has resigned himself to a wheelchair. We discussed his most recent admission, and that he has had increasing SOB for months.    We discussed his diagnosis, prognosis, GOC, EOL wishes disposition and options.  A detailed discussion was had today  regarding advanced directives.  Concepts specific to code status, artifical feeding and hydration, IV antibiotics and rehospitalization were discussed.  The difference between an aggressive medical intervention path and a comfort care path was discussed.  Values and goals of care important to patient and family were attempted to be elicited.  Discussed limitations of medical interventions to prolong quality of life in some situations and discussed the concept of human mortality.  He states there was a 4th sibling and she died. He states the family went through the process of withdrawal of care at the point of shifting from ETT to trach. He states their parents continued all care until that point. He states his father wold not want to live longterm by artificial means, but states he would want to try all care to see if he could improve. We discussed concern for his QOL.   Son states he will begin to talk with the family about care moving forward.    Of note, son states his father has a DNR form and should not have intervention if he is found dead, meaning do not resuscitate if he is not  breathing or has no pulse. They want all care up to the point of death including intubation for respiratory distress not corrected by BIPAP and ACLS for rhythm or breathing abnormailty. DNR noted in ACP documents in [place].     SUMMARY OF RECOMMENDATIONS    Continue current care.   Prognosis:   Poor overall      Primary Diagnoses: Present on Admission: . Atrial flutter ([place]) . Diabetes mellitus with nephropathy ([place]) . CKD (chronic kidney disease) stage 4, GFR 15-29 ml/min (HCC) . Anemia of chronic disease . Acute on chronic diastolic CHF (congestive heart failure) ([place]) . COVID-19 virus infection . NSTEMI (non-ST elevated myocardial infarction) ([place]) . PAD (peripheral artery disease) ([place])   I have reviewed the medical record, interviewed the patient and family, and examined the patient. The following  aspects are pertinent.  Past Medical History:  Diagnosis Date  . Anemia   . CHF (congestive heart failure) ([place])   . Diabetes mellitus without complication ([place])   . Hyperlipidemia   . Hypertension   . PVD (peripheral vascular disease) ([place])   . Skin cancer    Social History   Socioeconomic History  . Marital status: Married    Spouse name: Not on file  . Number of children: 5  . Years of education: 5th grade  . Highest education level: Not on file  Occupational History  . Occupation: Retired  Tobacco Use  . Smoking status: Never Smoker  . Smokeless tobacco: Never Used  Substance and Sexual Activity  . Alcohol use: No  . Drug use: No  . Sexual activity: Not Currently  Other Topics Concern  . Not on file  Social History Narrative   Lives at home by himself.  Ambulates with a walker   Has a  right prosthetic leg   Social Determinants of Health   Financial Resource Strain:   . Difficulty of Paying Living Expenses:   Food Insecurity:   . Worried About Charity fundraiser in the Last Year:   . Arboriculturist in the Last Year:   Transportation Needs:   . Film/video editor (Medical):   [name] Lack of Transportation (Non-Medical):   Physical Activity:   . Days of Exercise per Week:   . Minutes of Exercise per Session:   Stress:   . Feeling of Stress :   Social Connections:   . Frequency of Communication with Friends and Family:   . Frequency of Social Gatherings with Friends and Family:   . Attends Religious Services:   . Active Member of Clubs or Organizations:   . Attends Archivist Meetings:   [name] Marital Status:    Family History  Problem Relation Age of Onset  . Diabetes Mother   . Arthritis Sister   . Diabetes Daughter    Scheduled Meds: . apixaban  2.5 mg Oral BID  . vitamin C  500 mg Oral Daily  . aspirin EC  81 mg Oral Daily  . dexamethasone  6 mg Oral Daily  . docusate sodium  100 mg Oral Daily  . ferrous sulfate  325 mg Oral Daily  .  gluconic acid-citric acid  30 mL Irrigation QPM  . hydrALAZINE  25 mg Oral TID  . insulin aspart  0-6 Units Subcutaneous TID WC  . insulin aspart  2 Units Subcutaneous TID WC  . Ipratropium-Albuterol  1 puff Inhalation Q6H  . loratadine  10 mg Oral Daily  . pravastatin  20 mg  Oral q1800  . sodium chloride flush  3 mL Intravenous Q12H  . [START ON 07/31/2019] Vitamin D (Ergocalciferol)  50,000 Units Oral Q Sun  . zinc sulfate  220 mg Oral Daily   Continuous Infusions: . sodium chloride    . albumin human Stopped (07/26/19 2331)  . cefTRIAXone (ROCEPHIN)  IV Stopped (07/27/19 0910)  . furosemide (LASIX) infusion 10 mg/hr (07/27/19 1116)  . remdesivir 100 mg in NS 100 mL Stopped (07/27/19 1113)   PRN Meds:.sodium chloride, acetaminophen, chlorpheniramine-HYDROcodone, guaiFENesin-dextromethorphan, ondansetron (ZOFRAN) IV, sodium chloride flush Medications Prior to Admission:  Prior to Admission medications   Medication Sig Start Date End Date Taking? Authorizing Provider  acetaminophen (TYLENOL) 325 MG tablet Take 2 tablets (650 mg total) by mouth every 6 (six) hours as needed for mild pain (or Fever >/= 101). Patient taking differently: Take 650 mg by mouth every 6 (six) hours as needed for mild pain (general discomfort Max 3 grams/24hrs.).  12/09/17  Yes Gouru, Aruna, MD  acetic acid 0.25 % irrigation Irrigate with 1 application as directed See admin instructions. Use 30 ml via irrigation every evening shift for decrease bacterial load of supra pubic catheter   Yes [provider]  apixaban (ELIQUIS) 2.5 MG TABS tablet Take 1 tablet (2.5 mg total) by mouth 2 (two) times daily. 11/04/17  Yes Henreitta Leber, MD  aspirin EC 81 MG EC tablet Take 1 tablet (81 mg total) by mouth daily. 07/02/19  Yes Sreenath, Sudheer B, MD  beta carotene w/minerals (OCUVITE) tablet Take 1 tablet by mouth daily.   Yes [provider]  bumetanide (BUMEX) 0.5 MG tablet Take 1 tablet (0.5 mg total) by  mouth daily. 07/02/19  Yes Ralene Muskrat B, MD  Cholecalciferol (VITAMIN D3) 50000 units CAPS Take 50,000 Units by mouth every Sunday.    Yes [provider]  docusate sodium (COLACE) 100 MG capsule Take 100 mg by mouth daily.   Yes [provider]  felodipine (PLENDIL) 2.5 MG 24 hr tablet Take 7.5 mg by mouth daily.   Yes [provider]  ferrous sulfate 325 (65 FE) MG tablet Take 325 mg by mouth daily.    Yes [provider]  fexofenadine (ALLEGRA) 60 MG tablet Take 60 mg by mouth daily as needed for rhinitis (seasonal allergies.).    Yes [provider]  furosemide (LASIX) 40 MG tablet Take 40 mg by mouth daily.   Yes [provider]  hydrALAZINE (APRESOLINE) 25 MG tablet Take 25 mg by mouth 3 (three) times daily.    Yes [provider]  insulin aspart (NOVOLOG) 100 UNIT/ML injection Inject 0-9 Units into the skin 3 (three) times daily with meals. Patient taking differently: Inject 3-9 Units into the skin 3 (three) times daily with meals. Sliding Scale Insulin:150-200=3 units, 201-250=5 units, 251-300=7 units, 301-350=9 units 12/21/17  Yes Epifanio Lesches, MD  insulin detemir (LEVEMIR) 100 UNIT/ML injection Inject 32 Units into the skin at bedtime.   Yes [provider]  LEVEMIR FLEXTOUCH 100 UNIT/ML Pen Inject 22 Units into the skin daily.    Yes [provider]  lovastatin (MEVACOR) 40 MG tablet TAKE 1 TABLET (40 MG TOTAL) BY MOUTH DAILY. Patient taking differently: Take 40 mg by mouth daily.  05/11/17  Yes [name], MD  metoprolol tartrate (LOPRESSOR) 25 MG tablet Take 25 mg by mouth 2 (two) times daily.   Yes [provider]   Vital Signs: BP (!) 143/76   Pulse [name])  112   Temp (!) 97.5 F (36.4 C) (Oral)   Resp 20   Ht 5\' 8"  (1.727 m)   Wt 113.8 kg   SpO2 95%   BMI 38.15 kg/m  Pain Scale: 0-10   Pain Score: 0-No pain   SpO2: SpO2: 95 % O2 Device:SpO2: 95 % O2 Flow Rate: .O2  Flow Rate (L/min): 2 L/min  IO: Intake/output summary:   Intake/Output Summary (Last 24 hours) at 07/27/2019 1359 Last data filed at 07/27/2019 1116 Gross per 24 hour  Intake 530.16 ml  Output 550 ml  Net -19.84 ml    LBM: Last BM Date: 07/26/19 Baseline Weight: Weight: 98.9 kg Most recent weight: Weight: 113.8 kg     Palliative Assessment/Data:    COVID-19 DISASTER DECLARATION:    FULL CONTACT PHYSICAL EXAMINATION WAS NOT POSSIBLE DUE TO TREATMENT OF COVID-19  AND CONSERVATION OF PERSONAL PROTECTIVE EQUIPMENT.  Patient assessed or the symptoms described in the history of present illness.  In the context of the Global COVID-19 pandemic, which necessitated consideration that the patient might be at risk for infection with the SARS-CoV-2 virus that causes COVID-19, Institutional protocols and algorithms that pertain to the evaluation of patients at risk for COVID-19 are in a state of rapid change based on information released by regulatory bodies including the CDC and federal and state organizations. These policies and algorithms were followed during the patient's care while in hospital.  Time In: 1:15 Time Out: 2:05 Time Total: 50 min Greater than 50%  of this time was spent counseling and coordinating care related to the above assessment and plan.  Signed by: Asencion Gowda, NP   Please contact Palliative Medicine Team phone at [phone] for questions and concerns.  For individual provider: See Shea Evans

## 2019-07-28 DIAGNOSIS — L89150 Pressure ulcer of sacral region, unstageable: Secondary | ICD-10-CM

## 2019-07-28 LAB — CBC WITH DIFFERENTIAL/PLATELET
Abs Immature Granulocytes: 0.6 10*3/uL — ABNORMAL HIGH (ref 0.00–0.07)
Basophils Absolute: 0.1 10*3/uL (ref 0.0–0.1)
Basophils Relative: 1 %
Eosinophils Absolute: 0 10*3/uL (ref 0.0–0.5)
Eosinophils Relative: 0 %
HCT: 30.6 % — ABNORMAL LOW (ref 39.0–52.0)
Hemoglobin: 9.5 g/dL — ABNORMAL LOW (ref 13.0–17.0)
Immature Granulocytes: 6 %
Lymphocytes Relative: 4 %
Lymphs Abs: 0.4 10*3/uL — ABNORMAL LOW (ref 0.7–4.0)
MCH: 30.3 pg (ref 26.0–34.0)
MCHC: 31 g/dL (ref 30.0–36.0)
MCV: 97.5 fL (ref 80.0–100.0)
Monocytes Absolute: 0.7 10*3/uL (ref 0.1–1.0)
Monocytes Relative: 7 %
Neutro Abs: 8.6 10*3/uL — ABNORMAL HIGH (ref 1.7–7.7)
Neutrophils Relative %: 82 %
Platelets: 202 10*3/uL (ref 150–400)
RBC: 3.14 MIL/uL — ABNORMAL LOW (ref 4.22–5.81)
RDW: 14 % (ref 11.5–15.5)
Smear Review: NORMAL
WBC: 10.4 10*3/uL (ref 4.0–10.5)
nRBC: 0 % (ref 0.0–0.2)

## 2019-07-28 LAB — COMPREHENSIVE METABOLIC PANEL
ALT: 23 U/L (ref 0–44)
AST: 24 U/L (ref 15–41)
Albumin: 2.7 g/dL — ABNORMAL LOW (ref 3.5–5.0)
Alkaline Phosphatase: 123 U/L (ref 38–126)
Anion gap: 12 (ref 5–15)
BUN: 80 mg/dL — ABNORMAL HIGH (ref 8–23)
CO2: 23 mmol/L (ref 22–32)
Calcium: 8.2 mg/dL — ABNORMAL LOW (ref 8.9–10.3)
Chloride: 101 mmol/L (ref 98–111)
Creatinine, Ser: 2.44 mg/dL — ABNORMAL HIGH (ref 0.61–1.24)
GFR calc Af Amer: 27 mL/min — ABNORMAL LOW (ref >60)
GFR calc non Af Amer: 23 mL/min — ABNORMAL LOW (ref >60)
Glucose, Bld: 170 mg/dL — ABNORMAL HIGH (ref 70–99)
Potassium: 4.3 mmol/L (ref 3.5–5.1)
Sodium: 136 mmol/L (ref 135–145)
Total Bilirubin: 1 mg/dL (ref 0.3–1.2)
Total Protein: 6.5 g/dL (ref 6.5–8.1)

## 2019-07-28 LAB — GLUCOSE, CAPILLARY
Glucose-Capillary: 180 mg/dL — ABNORMAL HIGH (ref 70–99)
Glucose-Capillary: 155 mg/dL — ABNORMAL HIGH (ref 70–99)
Glucose-Capillary: 160 mg/dL — ABNORMAL HIGH (ref 70–99)
Glucose-Capillary: 144 mg/dL — ABNORMAL HIGH (ref 70–99)
Glucose-Capillary: 139 mg/dL — ABNORMAL HIGH (ref 70–99)

## 2019-07-28 LAB — PROCALCITONIN: Procalcitonin: 0.39 ng/mL

## 2019-07-28 LAB — PHOSPHORUS: Phosphorus: 5.7 mg/dL — ABNORMAL HIGH (ref 2.5–4.6)

## 2019-07-28 LAB — MAGNESIUM: Magnesium: 2.4 mg/dL (ref 1.7–2.4)

## 2019-07-28 LAB — C-REACTIVE PROTEIN: CRP: 10.2 mg/dL — ABNORMAL HIGH (ref <1.0)

## 2019-07-28 LAB — FIBRIN DERIVATIVES D-DIMER (ARMC ONLY): Fibrin derivatives D-dimer (ARMC): 4336.22 ng/mL (FEU) — ABNORMAL HIGH (ref 0.00–499.00)

## 2019-07-28 MED ORDER — CEPHALEXIN 500 MG PO CAPS
500.0000 mg | ORAL_CAPSULE | Freq: Three times a day (TID) | ORAL | Status: DC
  Administered 2019-07-28 – 2019-07-30 (×5): 500 mg via ORAL
  Filled 2019-07-28 (×5): qty 1

## 2019-07-28 NOTE — Progress Notes (Signed)
Daily Progress Note   Patient Name: Miguel Torres       Date: 07/28/2019 DOB: [birth year]  Age: 86 y.o. MRN#: [id number] Attending Physician: Lorella Nimrod, MD Primary Care Physician: [name], MD Admit Date: 07/25/2019  Reason for Consultation/Follow-up: Establishing goals of care  Subjective: Patient is on covid isolation. He does not answer the phone. Spoke with son Ron. He states he has been updated by the primary team and is aware his father is not doing well. He understands his father has a UTI and is curious as to how much the UTI is affecting his current status vs Covid vs his chronic health problems vs all combined to some degree. He states if possible, he would like to wait until Monday to see how he is doing after abx therapy, and then make a decision on comfort vs continued aggressive care.   Length of Stay: 3  Current Medications: Scheduled Meds:  . apixaban  2.5 mg Oral BID  . vitamin C  500 mg Oral Daily  . aspirin EC  81 mg Oral Daily  . cephALEXin  500 mg Oral Q8H  . dexamethasone  6 mg Oral Daily  . docusate sodium  100 mg Oral Daily  . ferrous sulfate  325 mg Oral Daily  . gluconic acid-citric acid  30 mL Irrigation QPM  . insulin aspart  0-6 Units Subcutaneous TID WC  . insulin aspart  2 Units Subcutaneous TID WC  . Ipratropium-Albuterol  1 puff Inhalation Q6H  . loratadine  10 mg Oral Daily  . pravastatin  20 mg Oral q1800  . sodium chloride flush  3 mL Intravenous Q12H  . [START ON 07/31/2019] Vitamin D (Ergocalciferol)  50,000 Units Oral Q Sun  . zinc sulfate  220 mg Oral Daily    Continuous Infusions: . sodium chloride    . albumin human 12.5 g (07/28/19 0920)  . furosemide (LASIX) infusion Stopped (07/27/19 1704)  . remdesivir 100 mg in NS 100  mL 100 mg (07/28/19 1239)    PRN Meds: sodium chloride, acetaminophen, chlorpheniramine-HYDROcodone, guaiFENesin-dextromethorphan, ondansetron (ZOFRAN) IV, sodium chloride flush    Vital Signs: BP 126/69 (BP Location: Right Arm)   Pulse (!) 114   Temp 98.5 F (36.9 C) (Oral)   Resp 15   Ht 5\' 8"  (1.727 m)  Wt 113 kg   SpO2 98%   BMI 37.89 kg/m  SpO2: SpO2: 98 % O2 Device: O2 Device: Room Air O2 Flow Rate: O2 Flow Rate (L/min): 2 L/min  Intake/output summary:   Intake/Output Summary (Last 24 hours) at 07/28/2019 1527 Last data filed at 07/28/2019 0300 Gross per 24 hour  Intake 213.25 ml  Output 1010 ml  Net -796.75 ml   LBM: Last BM Date: 07/28/19 Baseline Weight: Weight: 98.9 kg Most recent weight: Weight: 113 kg       Palliative Assessment/Data:      Patient Active Problem List   Diagnosis Date Noted  . Unstageable pressure ulcer of sacral region ([place]) 07/28/2019  . COVID-19 virus infection 07/25/2019  . Acute on chronic diastolic CHF (congestive heart failure) ([place]) 06/21/2019  . HTN (hypertension) 06/21/2019  . Type II diabetes mellitus with renal manifestations ([place]) 06/21/2019  . Iron deficiency anemia 06/21/2019  . PAD (peripheral artery disease) ([place])   . Venous ulcer of left lower extremity without varicose veins ([place]) 06/13/2019  . S/P BKA (below knee amputation) unilateral, right ([place]) 05/18/2019  . Atherosclerosis of native arteries of the extremities with ulceration ([place]) 03/14/2019  . Resides in skilled nursing facility 02/24/2018  . Benign prostatic hyperplasia with urinary obstruction 12/23/2017  . Urinary retention 12/18/2017  . Acute diastolic (congestive) heart failure ([place]) 12/17/2017  . Goals of care, counseling/discussion   . Palliative care encounter   . Congestive heart failure (CHF) ([place]) 12/16/2017  . Acute diastolic CHF (congestive heart failure) ([place]) 12/16/2017  . Acute kidney injury ([place]) 12/07/2017  . Foot ulceration, left,  with fat layer exposed ([place]) 11/23/2017  . Lymphedema 11/23/2017  . Shoulder pain, right 11/04/2017  . Pressure injury of skin 11/02/2017  . Elevated troponin 11/01/2017  . Microalbuminuria 10/15/2016  . BCC (basal cell carcinoma), face 06/24/2016  . Vitamin D deficiency 07/12/2015  . Retinopathy, diabetic, proliferative ([place]) 07/02/2015  . Bleeding duodenal ulcer 02/02/2015  . Atrial flutter ([place]) 01/19/2015  . Allergic rhinitis 01/17/2015  . Anemia of chronic disease 01/17/2015  . CKD (chronic kidney disease) stage 4, GFR 15-29 ml/min ([facility]) 01/17/2015  . Diabetes mellitus with nephropathy ([place]) 01/17/2015  . Edema 01/17/2015  . Gout 01/17/2015  . Amputation of right lower extremity below knee upon examination ([place]) 01/17/2015  . HLD (hyperlipidemia) 01/17/2015  . Psoriasis 01/17/2015  . CA of skin 01/17/2015  . Acquired absence of right leg below knee ([place]) 01/17/2015  . Hypertension 10/27/2014  . Mild cognitive disorder 12/05/2013  . Postop check 02/10/2013  . Cancer of skin, squamous cell 01/21/2013  . Squamous cell carcinoma of scalp and skin of neck 03/03/2011    Palliative Care Assessment & Plan    Recommendations/Plan:  Son states family is realistic, but would like to see how he does with a few days of abx, until Monday. He understands his father could decline before this time.     Code Status:    Code Status Orders  (From admission, onward)         Start     Ordered   07/27/19 1420  Limited resuscitation (code)  Continuous    Question Answer Comment  In the event of cardiac or respiratory ARREST: Initiate Code Blue, Call Rapid Response Yes   In the event of cardiac or respiratory ARREST: Perform CPR Yes   In the event of cardiac or respiratory ARREST: Perform Intubation/Mechanical Ventilation Yes   In the event of cardiac or respiratory ARREST: Use  NIPPV/BiPAp only if indicated Yes   In the event of cardiac or respiratory ARREST: Administer ACLS  medications if indicated Yes   In the event of cardiac or respiratory ARREST: Perform Defibrillation or Cardioversion if indicated Yes   Comments If patient is pulseless and apneic, no intervention. If not pulseless and apneic, do ACLS.      07/27/19 1420        Code Status History    Date Active Date Inactive Code Status Order ID Comments User Context   07/25/2019 1558 07/27/2019 1420 Full Code [id number]  Collier Bullock, MD ED   06/21/2019 1837 07/01/2019 1759 DNR [id number]  Ivor Costa, MD Inpatient   12/16/2017 1745 12/21/2017 1956 DNR [id number]  Vaughan Basta, MD Inpatient   12/16/2017 1647 12/16/2017 1745 DNR [id number]  Vaughan Basta, MD ED   12/07/2017 1424 12/09/2017 1613 Full Code [id number]  Salary, Avel Peace, MD Inpatient   11/01/2017 1358 11/04/2017 1719 Full Code [id number]  Gladstone Lighter, MD Inpatient   Advance Care Planning Activity       Prognosis:  Poor overall    Thank you for allowing the Palliative Medicine Team to assist in the care of this patient.  COVID-19 DISASTER DECLARATION:   PHYSICAL EXAMINATION WAS NOT POSSIBLE DUE TO TREATMENT OF COVID-19  AND CONSERVATION OF PERSONAL PROTECTIVE EQUIPMENT  Patient assessed or the symptoms described in the history of present illness.  In the context of the Global COVID-19 pandemic, which necessitated consideration that the patient might be at risk for infection with the SARS-CoV-2 virus that causes COVID-19, Institutional protocols and algorithms that pertain to the evaluation of patients at risk for COVID-19 are in a state of rapid change based on information released by regulatory bodies including the CDC and federal and state organizations. These policies and algorithms were followed during the patient's care while in hospital.  Total Time 35 min Prolonged Time Billed no      Greater than 50%  of this time was spent counseling and coordinating care related to the above assessment and plan.  Asencion Gowda, NP  Please contact Palliative Medicine Team phone at [phone] for questions and concerns.

## 2019-07-28 NOTE — Progress Notes (Signed)
PROGRESS NOTE    Miguel Torres  WUJ:[id number] DOB: [birth year] DOA: 07/25/2019 PCP: [name], MD   Brief Narrative:  Miguel Torres is a 86 y.o. male with medical history significant for hypertension, dyslipidemia, diabetes mellitus with complications of stage IV chronic kidney disease, history of peripheral vascular disease status post right BKA, history of atrial flutter on Eliquis and iron deficiency anemia.  He was sent to the emergency room for evaluation of shortness of breath. Patient was recently discharged from the hospital on 07/01/19 after hospitalization for CHF. He recently tested  positive for COVID 19 virus following routine testing at the [facility] where [facility]. Due to increased work of breathing EMS was called and he was brought to the ER. He was found to have a pulse oximetry of 90% on room air and was placed on 2L of oxygen. He has a new cough but denies having any fevers or chills.  Subjective: Patient appears lethargic and sleeping.  He was arousable but did not communicate today.  Assessment & Plan:   Principal Problem:   Acute on chronic diastolic CHF (congestive heart failure) (HCC) Active Problems:   Anemia of chronic disease   CKD (chronic kidney disease) stage 4, GFR 15-29 ml/min (HCC)   Diabetes mellitus with nephropathy (HCC)   Atrial flutter (HCC)   PAD (peripheral artery disease) ([place])   COVID-19 virus infection   NSTEMI (non-ST elevated myocardial infarction) (HCC)   Unstageable pressure ulcer of sacral region ([place])  Acute on chronic diastolic heart failure.  Patient appears volume overload. Hypoxia which improves with supplemental oxygen.  He was initially diuresed with IV Lasix and then switched to Lasix infusion for gentle diuresis by nephrology. Cardiology was consulted-they do not recommend any further work-up. -Continue diuresis with Lasix gtt. -Monitor BMP. -Strict intake and output. -Daily weight. -Palliative care consult as  patient has poor prognosis with multiple comorbidities.  They started the discussion about goals of care with his son.  Currently he will remain full scope of medical care.  COVID-19 infection.  Patient with mild hypoxia.  Chest x-ray with mild congestive heart failure no infiltrate.  Markedly elevated inflammatory markers, started improving.  Negative blood cultures.  UA looked infected. Mild hypothermia with positive leukocytosis. - urine culture-Proteus mirabilis. - PCT- 0.52>>0.32 -Continue remdesivir-3/5 -Continue Decadron for 10 days.3/10. -Continue to monitor inflammatory markers. -Oxygen supplement as needed to keep saturation above 90%.  UTI.  Urine culture with Proteus mirabilis with good sensitivity, resistant to nitrofurantoin.  He was started on ceftriaxone. -De-escalate antibiotics to Keflex for 7 days.  Elevated troponin.  Most likely secondary to demand.  Denies any chest pain. Trending down.  He was started on heparin infusion on admission and it was discontinued after 24-hour. -Continue to monitor.  Diabetes mellitus with stage 4 CKD. -Continue with SSI and sensitive mealtime coverage. -Might need increase in insulin as patient is on steroid now.  AKI with CKD stage IV.  Creatinine elevated at 2.58 on admission with variation since then.  Baseline of 1.7-1.8. -Nephrology was consulted and they are recommending Lasix infusion and IV albumin.  Lasix infusion rate was increased 4-6 today. -Continue to monitor. -Avoid nephrotoxins  History of Atrial Flutter.  Currently rate controlled. -Continue home dose of apixaban.  Peripheral arterial disease.  S/p right BKA Patient has an ulcer on left leg.  Lower extremity ultrasound with abnormal toe brachial index. -Vascular surgery was consulted-they do not have much to offer at this time. -Wound  care consult.  Objective: Vitals:   07/28/19 0945 07/28/19 1015 07/28/19 1030 07/28/19 1045  BP:      Pulse: (!) 114 (!) 114  (!) 114 (!) 114  Resp: 13 14 18 15   Temp:      TempSrc:      SpO2: 98% 99% 98% 98%  Weight:      Height:        Intake/Output Summary (Last 24 hours) at 07/28/2019 1346 Last data filed at 07/28/2019 0300 Gross per 24 hour  Intake 213.25 ml  Output 1010 ml  Net -796.75 ml   Filed Weights   07/26/19 0500 07/27/19 0500 07/28/19 0300  Weight: 126.5 kg 113.8 kg 113 kg    Examination:  General exam: Chronically ill-appearing elderly man, appears lethargic but comfortable  Respiratory system: Clear to auscultation with decreased breath sounds at bases, Respiratory effort normal. Cardiovascular system: S1 & S2 heard, RRR. No JVD, murmurs, rubs, gallops or clicks. Gastrointestinal system: Soft, nontender, nondistended, bowel sounds positive. Central nervous system: Is lethargic. No focal neurological deficits. Extremities: Right BKA, left lower extremity with Unna boot. Skin: Multiple skin lesions all over. Psychiatry: Judgement and insight appear impaired.  DVT prophylaxis: Eliquis Code Status: Full Family Communication: Son was updated on phone Disposition Plan: Pending improvement.  Currently patient is on remdesivir and Lasix infusion.  High risk for deterioration due to multiple comorbidities.  Palliative care was consulted.  Consultants:   Cardiology  Nephrology  Palliative care  Vascular surgery  Procedures:  Antimicrobials:   Data Reviewed: I have personally reviewed following labs and imaging studies  CBC: Recent Labs  Lab 07/25/19 1325 07/26/19 0103 07/27/19 0550 07/28/19 0529  WBC 19.0* 14.7* 10.6* 10.4  NEUTROABS 16.9*  --  8.3* 8.6*  HGB 9.8* 9.9* 9.1* 9.5*  HCT 30.1* 30.5* 28.2* 30.6*  MCV 94.7 94.4 95.3 97.5  PLT 200 200 209 655   Basic Metabolic Panel: Recent Labs  Lab 07/25/19 1325 07/26/19 0103 07/27/19 0550 07/28/19 0529  NA 129* 132* 134* 136  K 4.0 4.1 4.2 4.3  CL 96* 99 101 101  CO2 23 24 24 23   GLUCOSE 112* 85 250* 170*  BUN  63* 64* 75* 80*  CREATININE 2.58* 2.33* 2.38* 2.44*  CALCIUM 7.8* 7.9* 7.9* 8.2*  MG  --   --  2.3 2.4  PHOS  --   --  5.3* 5.7*   GFR: Estimated Creatinine Clearance: 26.5 mL/min (A) (by C-G formula based on SCr of 2.44 mg/dL (H)). Liver Function Tests: Recent Labs  Lab 07/26/19 0827 07/27/19 0550 07/28/19 0529  AST  --  35 24  ALT  --  28 23  ALKPHOS  --  147* 123  BILITOT  --  0.6 1.0  PROT  --  6.4* 6.5  ALBUMIN 2.4* 2.3* 2.7*   No results for input(s): LIPASE, AMYLASE in the last 168 hours. No results for input(s): AMMONIA in the last 168 hours. Coagulation Profile: Recent Labs  Lab 07/25/19 1707  INR 2.2*   Cardiac Enzymes: No results for input(s): CKTOTAL, CKMB, CKMBINDEX, TROPONINI in the last 168 hours. BNP (last 3 results) No results for input(s): PROBNP in the last 8760 hours. HbA1C: No results for input(s): HGBA1C in the last 72 hours. CBG: Recent Labs  Lab 07/27/19 1139 07/27/19 1622 07/27/19 2134 07/28/19 0839 07/28/19 1202  GLUCAP 194* 170* 180* 155* 160*   Lipid Profile: No results for input(s): CHOL, HDL, LDLCALC, TRIG, CHOLHDL, LDLDIRECT in the last 72  hours. Thyroid Function Tests: No results for input(s): TSH, T4TOTAL, FREET4, T3FREE, THYROIDAB in the last 72 hours. Anemia Panel: Recent Labs    07/25/19 1707  FERRITIN 36   Sepsis Labs: Recent Labs  Lab 07/25/19 1328 07/26/19 1622 07/27/19 0550 07/28/19 0529  PROCALCITON  --  0.55 0.52 0.39  LATICACIDVEN 1.0  --   --   --     Recent Results (from the past 240 hour(s))  SARS CORONAVIRUS 2 (TAT 6-24 HRS) Nasopharyngeal Nasopharyngeal Swab     Status: Abnormal   Collection Time: 07/22/19  1:05 PM   Specimen: Nasopharyngeal Swab  Result Value Ref Range Status   SARS Coronavirus 2 POSITIVE (A) NEGATIVE Final    Comment: (NOTE) SARS-CoV-2 target nucleic acids are DETECTED. The SARS-CoV-2 RNA is generally detectable in upper and lower respiratory specimens during the acute phase  of infection. Positive results are indicative of the presence of SARS-CoV-2 RNA. Clinical correlation with patient history and other diagnostic information is  necessary to determine patient infection status. Positive results do not rule out bacterial infection or co-infection with other viruses.  The expected result is Negative. Fact Sheet for Patients: SugarRoll.be Fact Sheet for Healthcare Providers: https://www.woods-mathews.com/ This test is not yet approved or cleared by the [place] FDA and  has been authorized for detection and/or diagnosis of SARS-CoV-2 by FDA under an Emergency Use Authorization (EUA). This EUA will remain  in effect (meaning this test can be used) for the duration of the COVID-19 declaration under Section 564(b)(1) of the Act, 21 U.S.C. se ction 360bbb-3(b)(1), unless the authorization is terminated or revoked sooner. Performed at [facility], [place] [address] [place], [place] [address]   Urine Culture     Status: Abnormal (Preliminary result)   Collection Time: 07/25/19  8:11 AM   Specimen: Urine, Random  Result Value Ref Range Status   Specimen Description   Final    URINE, RANDOM Performed at [facility], [address] [place], [place] [address]    Special Requests   Final    NONE Performed at [facility], [address] [place], [place] [address]    Culture (A)  Final    >=100,000 COLONIES/mL PROTEUS MIRABILIS 50,000 COLONIES/mL KLEBSIELLA PNEUMONIAE    Report Status PENDING  Incomplete   Organism ID, Bacteria PROTEUS MIRABILIS (A)  Final      Susceptibility   Proteus mirabilis - MIC*    AMPICILLIN <=2 SENSITIVE Sensitive     CEFAZOLIN <=4 SENSITIVE Sensitive     CEFTRIAXONE <=0.25 SENSITIVE Sensitive     CIPROFLOXACIN >=4 RESISTANT Resistant     GENTAMICIN <=1 SENSITIVE Sensitive     IMIPENEM 2 SENSITIVE Sensitive     NITROFURANTOIN 128 RESISTANT Resistant      TRIMETH/SULFA 40 SENSITIVE Sensitive     AMPICILLIN/SULBACTAM <=2 SENSITIVE Sensitive     PIP/TAZO <=4 SENSITIVE Sensitive     * >=100,000 COLONIES/mL PROTEUS MIRABILIS  Culture, blood (routine x 2)     Status: None (Preliminary result)   Collection Time: 07/25/19  3:49 PM   Specimen: BLOOD  Result Value Ref Range Status   Specimen Description BLOOD BLOOD RIGHT WRIST  Final   Special Requests   Final    BOTTLES DRAWN AEROBIC AND ANAEROBIC Blood Culture adequate volume   Culture   Final    NO GROWTH 3 DAYS Performed at [facility], [address] [place], [place] [address]    Report Status PENDING  Incomplete  Culture, blood (routine x 2)     Status: None (Preliminary result)   Collection Time: 07/25/19  3:49 PM   Specimen: BLOOD  Result Value Ref Range Status   Specimen Description BLOOD BLOOD RIGHT ARM  Final   Special Requests   Final    BOTTLES DRAWN AEROBIC AND ANAEROBIC Blood Culture adequate volume   Culture   Final    NO GROWTH 3 DAYS Performed at [facility], [address], [place], [place] [address]    Report Status PENDING  Incomplete     Radiology Studies: No results found.  Scheduled Meds: . apixaban  2.5 mg Oral BID  . vitamin C  500 mg Oral Daily  . aspirin EC  81 mg Oral Daily  . cephALEXin  500 mg Oral Q8H  . dexamethasone  6 mg Oral Daily  . docusate sodium  100 mg Oral Daily  . ferrous sulfate  325 mg Oral Daily  . gluconic acid-citric acid  30 mL Irrigation QPM  . insulin aspart  0-6 Units Subcutaneous TID WC  . insulin aspart  2 Units Subcutaneous TID WC  . Ipratropium-Albuterol  1 puff Inhalation Q6H  . loratadine  10 mg Oral Daily  . pravastatin  20 mg Oral q1800  . sodium chloride flush  3 mL Intravenous Q12H  . [START ON 07/31/2019] Vitamin D (Ergocalciferol)  50,000 Units Oral Q Sun  . zinc sulfate  220 mg Oral Daily   Continuous Infusions: . sodium chloride    . albumin human 12.5 g (07/28/19 0920)  . furosemide  (LASIX) infusion Stopped (07/27/19 1704)  . remdesivir 100 mg in NS 100 mL 100 mg (07/28/19 1239)     LOS: 3 days   Time spent: 40 minutes.  Lorella Nimrod, MD [facility]  If 7PM-7AM, please contact night-coverage [email or web address]  07/28/2019, 1:46 PM   This record has been created using Systems analyst. Errors have been sought and corrected,but may not always be located. Such creation errors do not reflect on the standard of care.

## 2019-07-28 NOTE — Progress Notes (Signed)
Bedside shift report received from [place], Therapist, sports. Morning assessment completed. Pt. Resting calmly in bed, arouses easily to name calling. Pt. Denies any current needs. Call light is within reach, will continue to monitor.

## 2019-07-28 NOTE — Progress Notes (Signed)
[address] [place], [place] [address] Phone [phone]. Fax [phone]  Date: 07/28/2019                  Patient Name:  Miguel Torres  MRN: [id number]  DOB: Dec 15, 1930  Age / Sex: 86 y.o., male         PCP: [name], MD                 Service Requesting Consult: IM/ Lorella Nimrod, MD                 Reason for Consult: ARF            History of Present Illness: Patient is a 86 y.o. male with medical problems of Hypertension, dyslipidemia, diabetes, chronic kidney disease, right BKA, atrial flutter requiring anticoagulation, who was admitted to [facility] on 07/25/2019 for evaluation of shortness of breath.  Patient is not able to provide details about his medical history. Per notes, he was recently hospitalized in late February 2021 for CHF.  He is a nursing home resident and tested positive for COVID-19 virus there. Baseline creatinine of 1.67/GFR 36 from June 21, 2019 Nephrology consult has been requested for acute kidney injury  Hospital course: Antihypertensives were discontinued and IV Lasix was changed to IV furosemide infusion, which is continued Blood pressure is higher and better controlled.  Patient although remains poorly responsive.  Did not interact much today.  Current medications: Current Facility-Administered Medications  Medication Dose Route Frequency Provider Last Rate Last Admin  . 0.9 %  sodium chloride infusion  250 mL Intravenous PRN Agbata, Tochukwu, MD      . acetaminophen (TYLENOL) tablet 650 mg  650 mg Oral Q6H PRN Agbata, Tochukwu, MD      . albumin human 25 % solution 12.5 g  12.5 g Intravenous TID Murlean Iba, MD 60 mL/hr at 07/28/19 0920 12.5 g at 07/28/19 0920  . apixaban (ELIQUIS) tablet 2.5 mg  2.5 mg Oral BID Lorella Nimrod, MD   2.5 mg at 07/28/19 0907  . ascorbic acid (VITAMIN C) tablet 500 mg  500 mg Oral Daily Lorella Nimrod, MD   500 mg at 07/28/19 0906  . aspirin EC tablet 81 mg  81 mg Oral Daily Agbata,  Tochukwu, MD   81 mg at 07/28/19 0906  . cefTRIAXone (ROCEPHIN) 1 g in sodium chloride 0.9 % 100 mL IVPB  1 g Intravenous Q24H Lorella Nimrod, MD   Stopped at 07/27/19 0910  . chlorpheniramine-HYDROcodone (TUSSIONEX) 10-8 MG/5ML suspension 5 mL  5 mL Oral Q12H PRN Lorella Nimrod, MD      . dexamethasone (DECADRON) tablet 6 mg  6 mg Oral Daily Agbata, Tochukwu, MD   6 mg at 07/28/19 0906  . docusate sodium (COLACE) capsule 100 mg  100 mg Oral Daily Agbata, Tochukwu, MD   100 mg at 07/28/19 0907  . ferrous sulfate tablet 325 mg  325 mg Oral Daily Agbata, Tochukwu, MD   325 mg at 07/28/19 0907  . furosemide (LASIX) 250 mg in dextrose 5 % 250 mL (1 mg/mL) infusion  4 mg/hr Intravenous Continuous Murlean Iba, MD   Stopped at 07/27/19 1704  . gluconic acid-citric acid (RENACIDIN) irrigation 30 mL  30 mL Irrigation QPM Lang Snow, NP   30 mL at 07/28/19 0107  . guaiFENesin-dextromethorphan (ROBITUSSIN DM) 100-10 MG/5ML syrup 10 mL  10 mL Oral Q4H PRN Lorella Nimrod, MD      . insulin aspart (novoLOG)  injection 0-6 Units  0-6 Units Subcutaneous TID WC Agbata, Tochukwu, MD   1 Units at 07/28/19 0908  . insulin aspart (novoLOG) injection 2 Units  2 Units Subcutaneous TID WC Agbata, Tochukwu, MD   2 Units at 07/28/19 0908  . Ipratropium-Albuterol (COMBIVENT) respimat 1 puff  1 puff Inhalation Q6H Lorella Nimrod, MD   1 puff at 07/27/19 2211  . loratadine (CLARITIN) tablet 10 mg  10 mg Oral Daily Agbata, Tochukwu, MD   10 mg at 07/28/19 0906  . ondansetron (ZOFRAN) injection 4 mg  4 mg Intravenous Q6H PRN Agbata, Tochukwu, MD      . pravastatin (PRAVACHOL) tablet 20 mg  20 mg Oral q1800 Agbata, Tochukwu, MD   20 mg at 07/27/19 2211  . remdesivir 100 mg in sodium chloride 0.9 % 100 mL IVPB  100 mg Intravenous Daily Lorella Nimrod, MD   Stopped at 07/27/19 1113  . sodium chloride flush (NS) 0.9 % injection 3 mL  3 mL Intravenous Q12H Agbata, Tochukwu, MD   3 mL at 07/28/19 0921  . sodium chloride flush  (NS) 0.9 % injection 3 mL  3 mL Intravenous PRN Agbata, Tochukwu, MD      . Derrill Memo ON 07/31/2019] Vitamin D (Ergocalciferol) (DRISDOL) capsule 50,000 Units  50,000 Units Oral Q Sun Agbata, Tochukwu, MD      . zinc sulfate capsule 220 mg  220 mg Oral Daily Lorella Nimrod, MD   220 mg at 07/28/19 0907     Vital Signs: Blood pressure 126/69, pulse (!) 114, temperature 98.5 F (36.9 C), temperature source Oral, resp. rate 15, height 5\' 8"  (1.727 m), weight 113 kg, SpO2 98 %.   Intake/Output Summary (Last 24 hours) at 07/28/2019 1110 Last data filed at 07/28/2019 0300 Gross per 24 hour  Intake 429.26 ml  Output 1010 ml  Net -580.74 ml    Weight trends: Filed Weights   07/26/19 0500 07/27/19 0500 07/28/19 0300  Weight: 126.5 kg 113.8 kg 113 kg    Physical Exam: General:  Elderly gentleman, laying in the bed  HEENT  anicteric, dry oral mucous membranes  Neck:  Supple, no masses  Lungs:  [place] O2, limited exam, normal effort  Heart::  No rub  Abdomen:  Soft,distended, scrotal edema, mild midepigastric diffuse tenderness  Extremities:  Rt BKA, Left leg wrapped, 2-3+ pitting edema  Neurologic:  Lethargic, minimally interactive  Skin:  Warm, dry  Foley:  Supra pubic cathter       Lab results: Basic Metabolic Panel: Recent Labs  Lab 07/26/19 0103 07/27/19 0550 07/28/19 0529  NA 132* 134* 136  K 4.1 4.2 4.3  CL 99 101 101  CO2 24 24 23   GLUCOSE 85 250* 170*  BUN 64* 75* 80*  CREATININE 2.33* 2.38* 2.44*  CALCIUM 7.9* 7.9* 8.2*  MG  --  2.3 2.4  PHOS  --  5.3* 5.7*    Liver Function Tests: Recent Labs  Lab 07/28/19 0529  AST 24  ALT 23  ALKPHOS 123  BILITOT 1.0  PROT 6.5  ALBUMIN 2.7*   No results for input(s): LIPASE, AMYLASE in the last 168 hours. No results for input(s): AMMONIA in the last 168 hours.  CBC: Recent Labs  Lab 07/27/19 0550 07/28/19 0529  WBC 10.6* 10.4  NEUTROABS 8.3* 8.6*  HGB 9.1* 9.5*  HCT 28.2* 30.6*  MCV 95.3 97.5  PLT 209 202     Cardiac Enzymes: No results for input(s): CKTOTAL, TROPONINI in the last 168 hours.  BNP: Invalid input(s): POCBNP  CBG: Recent Labs  Lab 07/26/19 1631 07/27/19 0830 07/27/19 1139 07/27/19 1622 07/28/19 0839  GLUCAP 196* 212* 194* 170* 155*    Microbiology: Recent Results (from the past 720 hour(s))  SARS CORONAVIRUS 2 (TAT 6-24 HRS) Nasopharyngeal Nasopharyngeal Swab     Status: None   Collection Time: 06/30/19  2:43 PM   Specimen: Nasopharyngeal Swab  Result Value Ref Range Status   SARS Coronavirus 2 NEGATIVE NEGATIVE Final    Comment: (NOTE) SARS-CoV-2 target nucleic acids are NOT DETECTED. The SARS-CoV-2 RNA is generally detectable in upper and lower respiratory specimens during the acute phase of infection. Negative results do not preclude SARS-CoV-2 infection, do not rule out co-infections with other pathogens, and should not be used as the sole basis for treatment or other patient management decisions. Negative results must be combined with clinical observations, patient history, and epidemiological information. The expected result is Negative. Fact Sheet for Patients: SugarRoll.be Fact Sheet for Healthcare Providers: https://www.woods-mathews.com/ This test is not yet approved or cleared by the [place] FDA and  has been authorized for detection and/or diagnosis of SARS-CoV-2 by FDA under an Emergency Use Authorization (EUA). This EUA will remain  in effect (meaning this test can be used) for the duration of the COVID-19 declaration under Section 56 4(b)(1) of the Act, 21 U.S.C. section 360bbb-3(b)(1), unless the authorization is terminated or revoked sooner. Performed at [facility], [place] [address] [place], [place] [address]   SARS CORONAVIRUS 2 (TAT 6-24 HRS) Nasopharyngeal Nasopharyngeal Swab     Status: Abnormal   Collection Time: 07/22/19  1:05 PM   Specimen: Nasopharyngeal Swab  Result Value  Ref Range Status   SARS Coronavirus 2 POSITIVE (A) NEGATIVE Final    Comment: (NOTE) SARS-CoV-2 target nucleic acids are DETECTED. The SARS-CoV-2 RNA is generally detectable in upper and lower respiratory specimens during the acute phase of infection. Positive results are indicative of the presence of SARS-CoV-2 RNA. Clinical correlation with patient history and other diagnostic information is  necessary to determine patient infection status. Positive results do not rule out bacterial infection or co-infection with other viruses.  The expected result is Negative. Fact Sheet for Patients: SugarRoll.be Fact Sheet for Healthcare Providers: https://www.woods-mathews.com/ This test is not yet approved or cleared by the [place] FDA and  has been authorized for detection and/or diagnosis of SARS-CoV-2 by FDA under an Emergency Use Authorization (EUA). This EUA will remain  in effect (meaning this test can be used) for the duration of the COVID-19 declaration under Section 564(b)(1) of the Act, 21 U.S.C. se ction 360bbb-3(b)(1), unless the authorization is terminated or revoked sooner. Performed at [facility], [place] [address], [place], [place] [address]   Urine Culture     Status: Abnormal (Preliminary result)   Collection Time: 07/25/19  8:11 AM   Specimen: Urine, Random  Result Value Ref Range Status   Specimen Description   Final    URINE, RANDOM Performed at [facility], [address], [place], [place] [address]    Special Requests   Final    NONE Performed at [facility], [place], [place] [address]    Culture (A)  Final    >=100,000 COLONIES/mL PROTEUS MIRABILIS 50,000 COLONIES/mL KLEBSIELLA PNEUMONIAE    Report Status PENDING  Incomplete   Organism ID, Bacteria PROTEUS MIRABILIS (A)  Final      Susceptibility   Proteus mirabilis - MIC*  AMPICILLIN <=2 SENSITIVE Sensitive      CEFAZOLIN <=4 SENSITIVE Sensitive     CEFTRIAXONE <=0.25 SENSITIVE Sensitive     CIPROFLOXACIN >=4 RESISTANT Resistant     GENTAMICIN <=1 SENSITIVE Sensitive     IMIPENEM 2 SENSITIVE Sensitive     NITROFURANTOIN 128 RESISTANT Resistant     TRIMETH/SULFA 40 SENSITIVE Sensitive     AMPICILLIN/SULBACTAM <=2 SENSITIVE Sensitive     PIP/TAZO <=4 SENSITIVE Sensitive     * >=100,000 COLONIES/mL PROTEUS MIRABILIS  Culture, blood (routine x 2)     Status: None (Preliminary result)   Collection Time: 07/25/19  3:49 PM   Specimen: BLOOD  Result Value Ref Range Status   Specimen Description BLOOD BLOOD RIGHT WRIST  Final   Special Requests   Final    BOTTLES DRAWN AEROBIC AND ANAEROBIC Blood Culture adequate volume   Culture   Final    NO GROWTH 3 DAYS Performed at [facility], [address], [place], [place] [address]    Report Status PENDING  Incomplete  Culture, blood (routine x 2)     Status: None (Preliminary result)   Collection Time: 07/25/19  3:49 PM   Specimen: BLOOD  Result Value Ref Range Status   Specimen Description BLOOD BLOOD RIGHT ARM  Final   Special Requests   Final    BOTTLES DRAWN AEROBIC AND ANAEROBIC Blood Culture adequate volume   Culture   Final    NO GROWTH 3 DAYS Performed at [facility], [address], [place], [place] [address]    Report Status PENDING  Incomplete     Coagulation Studies: Recent Labs    07/25/19 1707  LABPROT 24.6*  INR 2.2*    Urinalysis: Recent Labs    07/25/19 2104  COLORURINE YELLOW*  LABSPEC 1.011  PHURINE 9.0*  GLUCOSEU NEGATIVE  HGBUR NEGATIVE  BILIRUBINUR NEGATIVE  KETONESUR NEGATIVE  PROTEINUR 100*  NITRITE NEGATIVE  LEUKOCYTESUR MODERATE*        Imaging: No results found.   Assessment & Plan: Pt is a 86 y.o. caucasian  male with hypertension, atrial fibrillation, diabetes, chronic kidney disease, peripheral vascular disease, right BKA,, was admitted on 07/25/2019 with Elevated  troponin [R77.8] Acute on chronic diastolic CHF (congestive heart failure) (HCC) [I50.33] COVID-19 virus infection [U07.1]    #Acute kidney injury on chronic kidney disease stage IIIb Baseline creatinine of 1.67/GFR 36 from June 21, 2019 AKI likely secondary to hypotension from concurrent infection, aggressive diuresis -Hold antihypertensives -Urine output appears to have improved some.  Continue IV Lasix infusion and IV albumin supplementation -Increase dose to 6 mg/h -Serum creatinine remains elevated at 2.44 -Azotemia likely secondary to dexamethasone  #Anasarca #Chronic systolic CHF, grade 1 diastolic dysfunction #Pulmonary hypertension -IV Lasix infusion and IV albumin supplementation    #COVID-19 virus infection  Dexamethasone, Treatment as per internal medicine team  With underlying multiple chronic medical conditions, overall prognosis appears poor.     LOS: 3 [place] 3/25/202111:10 AM    Note: This note was prepared with Dragon dictation. Any transcription errors are unintentional

## 2019-07-29 LAB — CBC WITH DIFFERENTIAL/PLATELET
Abs Immature Granulocytes: 0.65 10*3/uL — ABNORMAL HIGH (ref 0.00–0.07)
Basophils Absolute: 0.1 10*3/uL (ref 0.0–0.1)
Basophils Relative: 1 %
Eosinophils Absolute: 0 10*3/uL (ref 0.0–0.5)
Eosinophils Relative: 0 %
HCT: 30.5 % — ABNORMAL LOW (ref 39.0–52.0)
Hemoglobin: 9.6 g/dL — ABNORMAL LOW (ref 13.0–17.0)
Immature Granulocytes: 6 %
Lymphocytes Relative: 4 %
Lymphs Abs: 0.5 10*3/uL — ABNORMAL LOW (ref 0.7–4.0)
MCH: 30.6 pg (ref 26.0–34.0)
MCHC: 31.5 g/dL (ref 30.0–36.0)
MCV: 97.1 fL (ref 80.0–100.0)
Monocytes Absolute: 0.9 10*3/uL (ref 0.1–1.0)
Monocytes Relative: 8 %
Neutro Abs: 9.3 10*3/uL — ABNORMAL HIGH (ref 1.7–7.7)
Neutrophils Relative %: 81 %
Platelets: 200 10*3/uL (ref 150–400)
RBC: 3.14 MIL/uL — ABNORMAL LOW (ref 4.22–5.81)
RDW: 14.2 % (ref 11.5–15.5)
Smear Review: NORMAL
WBC: 11.3 10*3/uL — ABNORMAL HIGH (ref 4.0–10.5)
nRBC: 0 % (ref 0.0–0.2)

## 2019-07-29 LAB — FIBRIN DERIVATIVES D-DIMER (ARMC ONLY): Fibrin derivatives D-dimer (ARMC): 4422.11 ng/mL (FEU) — ABNORMAL HIGH (ref 0.00–499.00)

## 2019-07-29 LAB — GLUCOSE, CAPILLARY
Glucose-Capillary: 172 mg/dL — ABNORMAL HIGH (ref 70–99)
Glucose-Capillary: 189 mg/dL — ABNORMAL HIGH (ref 70–99)
Glucose-Capillary: 235 mg/dL — ABNORMAL HIGH (ref 70–99)

## 2019-07-29 LAB — COMPREHENSIVE METABOLIC PANEL
ALT: 19 U/L (ref 0–44)
AST: 21 U/L (ref 15–41)
Albumin: 2.9 g/dL — ABNORMAL LOW (ref 3.5–5.0)
Alkaline Phosphatase: 113 U/L (ref 38–126)
Anion gap: 14 (ref 5–15)
BUN: 86 mg/dL — ABNORMAL HIGH (ref 8–23)
CO2: 22 mmol/L (ref 22–32)
Calcium: 8.4 mg/dL — ABNORMAL LOW (ref 8.9–10.3)
Chloride: 103 mmol/L (ref 98–111)
Creatinine, Ser: 2.29 mg/dL — ABNORMAL HIGH (ref 0.61–1.24)
GFR calc Af Amer: 29 mL/min — ABNORMAL LOW (ref >60)
GFR calc non Af Amer: 25 mL/min — ABNORMAL LOW (ref >60)
Glucose, Bld: 174 mg/dL — ABNORMAL HIGH (ref 70–99)
Potassium: 4.4 mmol/L (ref 3.5–5.1)
Sodium: 139 mmol/L (ref 135–145)
Total Bilirubin: 1 mg/dL (ref 0.3–1.2)
Total Protein: 6.4 g/dL — ABNORMAL LOW (ref 6.5–8.1)

## 2019-07-29 LAB — C-REACTIVE PROTEIN: CRP: 6.9 mg/dL — ABNORMAL HIGH (ref <1.0)

## 2019-07-29 LAB — PHOSPHORUS: Phosphorus: 5 mg/dL — ABNORMAL HIGH (ref 2.5–4.6)

## 2019-07-29 LAB — MAGNESIUM: Magnesium: 2.4 mg/dL (ref 1.7–2.4)

## 2019-07-29 NOTE — Progress Notes (Signed)
[address] [place], [place] [address] Phone [phone]. Fax [phone]  Date: 07/29/2019                  Patient Name:  Miguel Torres  MRN: [id number]  DOB: 1930-06-24  Age / Sex: 86 y.o., male         PCP: [name], MD                 Service Requesting Consult: IM/ Lorella Nimrod, MD                 Reason for Consult: ARF            History of Present Illness: Patient is a 86 y.o. male with medical problems of Hypertension, dyslipidemia, diabetes, chronic kidney disease, right BKA, atrial flutter requiring anticoagulation, who was admitted to [facility] on 07/25/2019 for evaluation of shortness of breath.  Patient is not able to provide details about his medical history. Per notes, he was recently hospitalized in late February 2021 for CHF.  He is a nursing home resident and tested positive for COVID-19 virus there. Baseline creatinine of 1.67/GFR 36 from June 21, 2019 Nephrology consult has been requested for acute kidney injury  Hospital course: Antihypertensives were discontinued and IV Lasix was changed to IV furosemide infusion, which is continued Blood pressure is higher and better controlled.  Patient remains poorly responsive.  Did not interact much today. Good response to lasix  Current medications: Current Facility-Administered Medications  Medication Dose Route Frequency Provider Last Rate Last Admin  . 0.9 %  sodium chloride infusion  250 mL Intravenous PRN Agbata, Tochukwu, MD      . acetaminophen (TYLENOL) tablet 650 mg  650 mg Oral Q6H PRN Agbata, Tochukwu, MD      . albumin human 25 % solution 12.5 g  12.5 g Intravenous TID Murlean Iba, MD 60 mL/hr at 07/29/19 1757 12.5 g at 07/29/19 1757  . apixaban (ELIQUIS) tablet 2.5 mg  2.5 mg Oral BID Lorella Nimrod, MD   2.5 mg at 07/29/19 1037  . ascorbic acid (VITAMIN C) tablet 500 mg  500 mg Oral Daily Lorella Nimrod, MD   500 mg at 07/29/19 1037  . aspirin EC tablet 81 mg  81 mg Oral  Daily Agbata, Tochukwu, MD   81 mg at 07/29/19 1038  . cephALEXin (KEFLEX) capsule 500 mg  500 mg Oral Q8H Lorella Nimrod, MD   500 mg at 07/29/19 1432  . chlorpheniramine-HYDROcodone (TUSSIONEX) 10-8 MG/5ML suspension 5 mL  5 mL Oral Q12H PRN Lorella Nimrod, MD      . dexamethasone (DECADRON) tablet 6 mg  6 mg Oral Daily Agbata, Tochukwu, MD   6 mg at 07/29/19 1038  . docusate sodium (COLACE) capsule 100 mg  100 mg Oral Daily Agbata, Tochukwu, MD   100 mg at 07/29/19 1037  . ferrous sulfate tablet 325 mg  325 mg Oral Daily Agbata, Tochukwu, MD   325 mg at 07/29/19 1037  . furosemide (LASIX) 250 mg in dextrose 5 % 250 mL (1 mg/mL) infusion  4 mg/hr Intravenous Continuous Murlean Iba, MD 4 mL/hr at 07/28/19 1743 4 mg/hr at 07/28/19 1743  . gluconic acid-citric acid (RENACIDIN) irrigation 30 mL  30 mL Irrigation QPM Lang Snow, NP   30 mL at 07/28/19 0107  . guaiFENesin-dextromethorphan (ROBITUSSIN DM) 100-10 MG/5ML syrup 10 mL  10 mL Oral Q4H PRN Lorella Nimrod, MD      . insulin aspart (  novoLOG) injection 0-6 Units  0-6 Units Subcutaneous TID WC Agbata, Tochukwu, MD   2 Units at 07/29/19 1758  . insulin aspart (novoLOG) injection 2 Units  2 Units Subcutaneous TID WC Agbata, Tochukwu, MD   2 Units at 07/29/19 1758  . Ipratropium-Albuterol (COMBIVENT) respimat 1 puff  1 puff Inhalation Q6H Lorella Nimrod, MD   1 puff at 07/29/19 1800  . loratadine (CLARITIN) tablet 10 mg  10 mg Oral Daily Agbata, Tochukwu, MD   10 mg at 07/29/19 1037  . ondansetron (ZOFRAN) injection 4 mg  4 mg Intravenous Q6H PRN Agbata, Tochukwu, MD      . pravastatin (PRAVACHOL) tablet 20 mg  20 mg Oral q1800 Agbata, Tochukwu, MD   20 mg at 07/28/19 2240  . remdesivir 100 mg in sodium chloride 0.9 % 100 mL IVPB  100 mg Intravenous Daily Lorella Nimrod, MD 200 mL/hr at 07/29/19 1229 100 mg at 07/29/19 1229  . sodium chloride flush (NS) 0.9 % injection 3 mL  3 mL Intravenous Q12H Agbata, Tochukwu, MD   3 mL at 07/29/19 1038   . sodium chloride flush (NS) 0.9 % injection 3 mL  3 mL Intravenous PRN Agbata, Tochukwu, MD      . Derrill Memo ON 07/31/2019] Vitamin D (Ergocalciferol) (DRISDOL) capsule 50,000 Units  50,000 Units Oral Q Sun Agbata, Tochukwu, MD      . zinc sulfate capsule 220 mg  220 mg Oral Daily Lorella Nimrod, MD   220 mg at 07/29/19 1037     Vital Signs: Blood pressure (!) 158/82, pulse 99, temperature 98.6 F (37 C), temperature source Oral, resp. rate 12, height 5\' 8"  (1.727 m), weight 113 kg, SpO2 97 %.   Intake/Output Summary (Last 24 hours) at 07/29/2019 1831 Last data filed at 07/29/2019 1706 Gross per 24 hour  Intake -  Output 1850 ml  Net -1850 ml    Weight trends: Filed Weights   07/26/19 0500 07/27/19 0500 07/28/19 0300  Weight: 126.5 kg 113.8 kg 113 kg    Physical Exam: General:  Elderly gentleman, laying in the bed  HEENT  anicteric, dry oral mucous membranes  Neck:  Supple, no masses  Lungs:   limited exam, normal effort  Heart::  No rub  Abdomen:  Soft,distended, scrotal edema, mild midepigastric diffuse tenderness  Extremities:  Rt BKA, Left leg wrapped, 2-3+ pitting edema  Neurologic:  Lethargic, minimally interactive  Skin:  Warm, dry  Foley:  Supra pubic cathter       Lab results: Basic Metabolic Panel: Recent Labs  Lab 07/27/19 0550 07/28/19 0529 07/29/19 0430  NA 134* 136 139  K 4.2 4.3 4.4  CL 101 101 103  CO2 24 23 22   GLUCOSE 250* 170* 174*  BUN 75* 80* 86*  CREATININE 2.38* 2.44* 2.29*  CALCIUM 7.9* 8.2* 8.4*  MG 2.3 2.4 2.4  PHOS 5.3* 5.7* 5.0*    Liver Function Tests: Recent Labs  Lab 07/29/19 0430  AST 21  ALT 19  ALKPHOS 113  BILITOT 1.0  PROT 6.4*  ALBUMIN 2.9*   No results for input(s): LIPASE, AMYLASE in the last 168 hours. No results for input(s): AMMONIA in the last 168 hours.  CBC: Recent Labs  Lab 07/28/19 0529 07/29/19 0430  WBC 10.4 11.3*  NEUTROABS 8.6* 9.3*  HGB 9.5* 9.6*  HCT 30.6* 30.5*  MCV 97.5 97.1  PLT 202  200    Cardiac Enzymes: No results for input(s): CKTOTAL, TROPONINI in the last 168 hours.  BNP: Invalid input(s): POCBNP  CBG: Recent Labs  Lab 07/28/19 1202 07/28/19 1618 07/28/19 2052 07/29/19 0749 07/29/19 0828  GLUCAP 160* 144* 139* 189* 172*    Microbiology: Recent Results (from the past 720 hour(s))  SARS CORONAVIRUS 2 (TAT 6-24 HRS) Nasopharyngeal Nasopharyngeal Swab     Status: None   Collection Time: 06/30/19  2:43 PM   Specimen: Nasopharyngeal Swab  Result Value Ref Range Status   SARS Coronavirus 2 NEGATIVE NEGATIVE Final    Comment: (NOTE) SARS-CoV-2 target nucleic acids are NOT DETECTED. The SARS-CoV-2 RNA is generally detectable in upper and lower respiratory specimens during the acute phase of infection. Negative results do not preclude SARS-CoV-2 infection, do not rule out co-infections with other pathogens, and should not be used as the sole basis for treatment or other patient management decisions. Negative results must be combined with clinical observations, patient history, and epidemiological information. The expected result is Negative. Fact Sheet for Patients: SugarRoll.be Fact Sheet for Healthcare Providers: https://www.woods-mathews.com/ This test is not yet approved or cleared by the [place] FDA and  has been authorized for detection and/or diagnosis of SARS-CoV-2 by FDA under an Emergency Use Authorization (EUA). This EUA will remain  in effect (meaning this test can be used) for the duration of the COVID-19 declaration under Section 56 4(b)(1) of the Act, 21 U.S.C. section 360bbb-3(b)(1), unless the authorization is terminated or revoked sooner. Performed at [facility], [place] [address], [place], [place] [address]   SARS CORONAVIRUS 2 (TAT 6-24 HRS) Nasopharyngeal Nasopharyngeal Swab     Status: Abnormal   Collection Time: 07/22/19  1:05 PM   Specimen: Nasopharyngeal Swab  Result  Value Ref Range Status   SARS Coronavirus 2 POSITIVE (A) NEGATIVE Final    Comment: (NOTE) SARS-CoV-2 target nucleic acids are DETECTED. The SARS-CoV-2 RNA is generally detectable in upper and lower respiratory specimens during the acute phase of infection. Positive results are indicative of the presence of SARS-CoV-2 RNA. Clinical correlation with patient history and other diagnostic information is  necessary to determine patient infection status. Positive results do not rule out bacterial infection or co-infection with other viruses.  The expected result is Negative. Fact Sheet for Patients: SugarRoll.be Fact Sheet for Healthcare Providers: https://www.woods-mathews.com/ This test is not yet approved or cleared by the [place] FDA and  has been authorized for detection and/or diagnosis of SARS-CoV-2 by FDA under an Emergency Use Authorization (EUA). This EUA will remain  in effect (meaning this test can be used) for the duration of the COVID-19 declaration under Section 564(b)(1) of the Act, 21 U.S.C. se ction 360bbb-3(b)(1), unless the authorization is terminated or revoked sooner. Performed at [facility], [place] [address] [place], [place] [address]   Urine Culture     Status: Abnormal (Preliminary result)   Collection Time: 07/25/19  8:11 AM   Specimen: Urine, Random  Result Value Ref Range Status   Specimen Description   Final    URINE, RANDOM Performed at [facility], [address], [place], [place] [address]    Special Requests   Final    NONE Performed at [facility], [place], [place] [address]    Culture (A)  Final    >=100,000 COLONIES/mL PROTEUS MIRABILIS 50,000 COLONIES/mL KLEBSIELLA PNEUMONIAE    Report Status PENDING  Incomplete   Organism ID, Bacteria PROTEUS MIRABILIS (A)  Final      Susceptibility   Proteus mirabilis - MIC*  AMPICILLIN <=2 SENSITIVE Sensitive      CEFAZOLIN <=4 SENSITIVE Sensitive     CEFTRIAXONE <=0.25 SENSITIVE Sensitive     CIPROFLOXACIN >=4 RESISTANT Resistant     GENTAMICIN <=1 SENSITIVE Sensitive     IMIPENEM 2 SENSITIVE Sensitive     NITROFURANTOIN 128 RESISTANT Resistant     TRIMETH/SULFA 40 SENSITIVE Sensitive     AMPICILLIN/SULBACTAM <=2 SENSITIVE Sensitive     PIP/TAZO <=4 SENSITIVE Sensitive     * >=100,000 COLONIES/mL PROTEUS MIRABILIS  Culture, blood (routine x 2)     Status: None (Preliminary result)   Collection Time: 07/25/19  3:49 PM   Specimen: BLOOD  Result Value Ref Range Status   Specimen Description BLOOD BLOOD RIGHT WRIST  Final   Special Requests   Final    BOTTLES DRAWN AEROBIC AND ANAEROBIC Blood Culture adequate volume   Culture   Final    NO GROWTH 4 DAYS Performed at [facility], [address], [place], [place] [address]    Report Status PENDING  Incomplete  Culture, blood (routine x 2)     Status: None (Preliminary result)   Collection Time: 07/25/19  3:49 PM   Specimen: BLOOD  Result Value Ref Range Status   Specimen Description BLOOD BLOOD RIGHT ARM  Final   Special Requests   Final    BOTTLES DRAWN AEROBIC AND ANAEROBIC Blood Culture adequate volume   Culture   Final    NO GROWTH 4 DAYS Performed at [facility], [address] [place], [place] [address]    Report Status PENDING  Incomplete     Coagulation Studies: No results for input(s): LABPROT, INR in the last 72 hours.  Urinalysis: No results for input(s): COLORURINE, LABSPEC, PHURINE, GLUCOSEU, HGBUR, BILIRUBINUR, KETONESUR, PROTEINUR, UROBILINOGEN, NITRITE, LEUKOCYTESUR in the last 72 hours.  Invalid input(s): APPERANCEUR      Imaging: No results found.   Assessment & Plan: Pt is a 86 y.o. caucasian  male with hypertension, atrial fibrillation, diabetes, chronic kidney disease, peripheral vascular disease, right BKA,, was admitted on 07/25/2019 with Elevated troponin [R77.8] Acute on chronic  diastolic CHF (congestive heart failure) (HCC) [I50.33] COVID-19 virus infection [U07.1]    #Acute kidney injury on chronic kidney disease stage IIIb Baseline creatinine of 1.67/GFR 36 from June 21, 2019 AKI likely secondary to hypotension from concurrent infection, aggressive diuresis -Hold antihypertensives -Urine output appears to have improved some.  Continue IV Lasix infusion and IV albumin supplementation -Serum creatinine remains elevated at 2.3 -Azotemia likely secondary to dexamethasone  #Anasarca #Chronic systolic CHF, grade 1 diastolic dysfunction #Pulmonary hypertension -IV Lasix infusion and IV albumin supplementation    #COVID-19 virus infection  Dexamethasone, Treatment as per internal medicine team  With underlying multiple chronic medical conditions, overall prognosis appears poor.     LOS: [place] 3/26/20216:31 PM    Note: This note was prepared with Dragon dictation. Any transcription errors are unintentional

## 2019-07-29 NOTE — Progress Notes (Signed)
Bedside shift report received from [place], [place]. Morning assessment completed. Pt. Sitting up in bed, resting calmly. Pt. Arouses easily to name calling. Denies any pain or needs. Call light is within reach, will continue to monitor.

## 2019-07-29 NOTE — Progress Notes (Signed)
PROGRESS NOTE    Miguel Torres  SWN:[id number] DOB: Sep 05, 1930 DOA: 07/25/2019 PCP: [name], MD   Brief Narrative:  Miguel Torres is a 86 y.o. male with medical history significant for hypertension, dyslipidemia, diabetes mellitus with complications of stage IV chronic kidney disease, history of peripheral vascular disease status post right BKA, history of atrial flutter on Eliquis and iron deficiency anemia.  He was sent to the emergency room for evaluation of shortness of breath. Patient was recently discharged from the hospital on 07/01/19 after hospitalization for CHF. He recently tested  positive for COVID 19 virus following routine testing at the [facility] where he resides. Due to increased work of breathing EMS was called and he was brought to the ER. He was found to have a pulse oximetry of 90% on room air and was placed on 2L of oxygen. He has a new cough but denies having any fevers or chills.  Subjective: Patient appears lethargic but little more alert today.  No new complaints.  Assessment & Plan:   Principal Problem:   Acute on chronic diastolic CHF (congestive heart failure) (HCC) Active Problems:   Anemia of chronic disease   CKD (chronic kidney disease) stage 4, GFR 15-29 ml/min (HCC)   Diabetes mellitus with nephropathy (HCC)   Atrial flutter (HCC)   PAD (peripheral artery disease) ([place])   COVID-19 virus infection   Unstageable pressure ulcer of sacral region ([place])  Acute on chronic diastolic heart failure.  Patient appears volume overloaded with anasarca. Hypoxia which improves with supplemental oxygen.  He was initially diuresed with IV Lasix and then switched to Lasix infusion for gentle diuresis by nephrology. Cardiology was consulted-they do not recommend any further work-up. -Continue diuresis with Lasix gtt. -Monitor BMP. -Strict intake and output. -Daily weight. -Palliative care consult as patient has poor prognosis with multiple comorbidities.   They started the discussion about goals of care with his son, family need more time to decide.  Currently he will remain full scope of medical care.  COVID-19 infection.  Patient with mild hypoxia.  Chest x-ray with mild congestive heart failure no infiltrate.  Markedly elevated inflammatory markers, started improving.  Negative blood cultures.  UA looked infected. Mild hypothermia with positive leukocytosis. - urine culture-Proteus mirabilis. - PCT- 0.52>>0.39 -Continue remdesivir-4/5 -Continue Decadron for 10 days.4/10. -Continue to monitor inflammatory markers. -Oxygen supplement as needed to keep saturation above 90%.  UTI.  Urine culture with Proteus mirabilis with good sensitivity, resistant to nitrofurantoin.  He was started on ceftriaxone. -De-escalate antibiotics to Keflex for 7 days.  Elevated troponin.  Most likely secondary to demand.  Denies any chest pain. Trending down.  He was started on heparin infusion on admission and it was discontinued after 24-hour. -Continue to monitor.  Diabetes mellitus with stage 4 CKD. -Continue with SSI and sensitive mealtime coverage. -Might need increase in insulin as patient is on steroid now.  AKI with CKD stage IV.  Creatinine elevated at 2.58 on admission with variation since then.  Baseline of 1.7-1.8. -Nephrology was consulted and they are recommending Lasix infusion and IV albumin.  Lasix infusion rate was increased 4-6 yesterday.  Improvement in his swelling and urinary output. -Continue to monitor. -Avoid nephrotoxins  History of Atrial Flutter.  Currently rate controlled. -Continue home dose of apixaban.  Peripheral arterial disease.  S/p right BKA Patient has an ulcer on left leg.  Lower extremity ultrasound with abnormal toe brachial index. -Vascular surgery was consulted-they do not have much  to offer at this time. -Wound care consult.  Objective: Vitals:   07/28/19 1045 07/29/19 0033 07/29/19 0822 07/29/19 1212  BP:   (!) 127/40 109/86 (!) 163/77  Pulse: (!) 114 (!) 115 (!) 114 (!) 111  Resp: 15 18 19 19   Temp:  98.7 F (37.1 C) 98.6 F (37 C) 98.5 F (36.9 C)  TempSrc:   Oral Oral  SpO2: 98% 95% 97% 98%  Weight:      Height:        Intake/Output Summary (Last 24 hours) at 07/29/2019 1338 Last data filed at 07/29/2019 0825 Gross per 24 hour  Intake --  Output 1400 ml  Net -1400 ml   Filed Weights   07/26/19 0500 07/27/19 0500 07/28/19 0300  Weight: 126.5 kg 113.8 kg 113 kg    Examination:  General exam: Chronically ill-appearing elderly man, appears lethargic but comfortable  Respiratory system: Clear to auscultation with decreased breath sounds at bases, Respiratory effort normal. Cardiovascular system: S1 & S2 heard, RRR. No JVD, murmurs, rubs, gallops or clicks. Gastrointestinal system: Soft, nontender, nondistended, bowel sounds positive. Central nervous system: Is lethargic. No focal neurological deficits. Extremities: Right BKA, left lower extremity with Unna boot. Skin: Multiple skin lesions all over. Psychiatry: Judgement and insight appear impaired.  DVT prophylaxis: Eliquis Code Status: Full Family Communication: Son was updated on phone Disposition Plan: Pending improvement.  Currently patient is on remdesivir and Lasix infusion.  High risk for deterioration due to multiple comorbidities.  Palliative care was consulted, ongoing discussion regarding goals of care.  Family wants some time to decide between continuation of care/comfort measures.  Consultants:   Cardiology  Nephrology  Palliative care  Vascular surgery  Procedures:  Antimicrobials:  Keflex  Data Reviewed: I have personally reviewed following labs and imaging studies  CBC: Recent Labs  Lab 07/25/19 1325 07/26/19 0103 07/27/19 0550 07/28/19 0529 07/29/19 0430  WBC 19.0* 14.7* 10.6* 10.4 11.3*  NEUTROABS 16.9*  --  8.3* 8.6* 9.3*  HGB 9.8* 9.9* 9.1* 9.5* 9.6*  HCT 30.1* 30.5* 28.2* 30.6* 30.5*   MCV 94.7 94.4 95.3 97.5 97.1  PLT 200 200 209 202 161   Basic Metabolic Panel: Recent Labs  Lab 07/25/19 1325 07/26/19 0103 07/27/19 0550 07/28/19 0529 07/29/19 0430  NA 129* 132* 134* 136 139  K 4.0 4.1 4.2 4.3 4.4  CL 96* 99 101 101 103  CO2 23 24 24 23 22   GLUCOSE 112* 85 250* 170* 174*  BUN 63* 64* 75* 80* 86*  CREATININE 2.58* 2.33* 2.38* 2.44* 2.29*  CALCIUM 7.8* 7.9* 7.9* 8.2* 8.4*  MG  --   --  2.3 2.4 2.4  PHOS  --   --  5.3* 5.7* 5.0*   GFR: Estimated Creatinine Clearance: 28.2 mL/min (A) (by C-G formula based on SCr of 2.29 mg/dL (H)). Liver Function Tests: Recent Labs  Lab 07/26/19 0827 07/27/19 0550 07/28/19 0529 07/29/19 0430  AST  --  35 24 21  ALT  --  28 23 19   ALKPHOS  --  147* 123 113  BILITOT  --  0.6 1.0 1.0  PROT  --  6.4* 6.5 6.4*  ALBUMIN 2.4* 2.3* 2.7* 2.9*   No results for input(s): LIPASE, AMYLASE in the last 168 hours. No results for input(s): AMMONIA in the last 168 hours. Coagulation Profile: Recent Labs  Lab 07/25/19 1707  INR 2.2*   Cardiac Enzymes: No results for input(s): CKTOTAL, CKMB, CKMBINDEX, TROPONINI in the last 168 hours. BNP (last  3 results) No results for input(s): PROBNP in the last 8760 hours. HbA1C: No results for input(s): HGBA1C in the last 72 hours. CBG: Recent Labs  Lab 07/28/19 1202 07/28/19 1618 07/28/19 2052 07/29/19 0749 07/29/19 0828  GLUCAP 160* 144* 139* 189* 172*   Lipid Profile: No results for input(s): CHOL, HDL, LDLCALC, TRIG, CHOLHDL, LDLDIRECT in the last 72 hours. Thyroid Function Tests: No results for input(s): TSH, T4TOTAL, FREET4, T3FREE, THYROIDAB in the last 72 hours. Anemia Panel: No results for input(s): VITAMINB12, FOLATE, FERRITIN, TIBC, IRON, RETICCTPCT in the last 72 hours. Sepsis Labs: Recent Labs  Lab 07/25/19 1328 07/26/19 1622 07/27/19 0550 07/28/19 0529  PROCALCITON  --  0.55 0.52 0.39  LATICACIDVEN 1.0  --   --   --     Recent Results (from the past 240  hour(s))  SARS CORONAVIRUS 2 (TAT 6-24 HRS) Nasopharyngeal Nasopharyngeal Swab     Status: Abnormal   Collection Time: 07/22/19  1:05 PM   Specimen: Nasopharyngeal Swab  Result Value Ref Range Status   SARS Coronavirus 2 POSITIVE (A) NEGATIVE Final    Comment: (NOTE) SARS-CoV-2 target nucleic acids are DETECTED. The SARS-CoV-2 RNA is generally detectable in upper and lower respiratory specimens during the acute phase of infection. Positive results are indicative of the presence of SARS-CoV-2 RNA. Clinical correlation with patient history and other diagnostic information is  necessary to determine patient infection status. Positive results do not rule out bacterial infection or co-infection with other viruses.  The expected result is Negative. Fact Sheet for Patients: SugarRoll.be Fact Sheet for Healthcare Providers: https://www.woods-mathews.com/ This test is not yet approved or cleared by the [place] FDA and  has been authorized for detection and/or diagnosis of SARS-CoV-2 by FDA under an Emergency Use Authorization (EUA). This EUA will remain  in effect (meaning this test can be used) for the duration of the COVID-19 declaration under Section 564(b)(1) of the Act, 21 U.S.C. se ction 360bbb-3(b)(1), unless the authorization is terminated or revoked sooner. Performed at [facility], [place] [address], [place], [place] [address]   Urine Culture     Status: Abnormal (Preliminary result)   Collection Time: 07/25/19  8:11 AM   Specimen: Urine, Random  Result Value Ref Range Status   Specimen Description   Final    URINE, RANDOM Performed at [facility], [address] [place], [place] [address]    Special Requests   Final    NONE Performed at [facility], [address] [place], [place] [address]    Culture (A)  Final    >=100,000 COLONIES/mL PROTEUS MIRABILIS 50,000 COLONIES/mL KLEBSIELLA PNEUMONIAE     Report Status PENDING  Incomplete   Organism ID, Bacteria PROTEUS MIRABILIS (A)  Final      Susceptibility   Proteus mirabilis - MIC*    AMPICILLIN <=2 SENSITIVE Sensitive     CEFAZOLIN <=4 SENSITIVE Sensitive     CEFTRIAXONE <=0.25 SENSITIVE Sensitive     CIPROFLOXACIN >=4 RESISTANT Resistant     GENTAMICIN <=1 SENSITIVE Sensitive     IMIPENEM 2 SENSITIVE Sensitive     NITROFURANTOIN 128 RESISTANT Resistant     TRIMETH/SULFA 40 SENSITIVE Sensitive     AMPICILLIN/SULBACTAM <=2 SENSITIVE Sensitive     PIP/TAZO <=4 SENSITIVE Sensitive     * >=100,000 COLONIES/mL PROTEUS MIRABILIS  Culture, blood (routine x 2)     Status: None (Preliminary result)   Collection Time: 07/25/19  3:49 PM   Specimen: BLOOD  Result Value Ref Range Status   Specimen Description BLOOD BLOOD RIGHT WRIST  Final   Special Requests   Final    BOTTLES DRAWN AEROBIC AND ANAEROBIC Blood Culture adequate volume   Culture   Final    NO GROWTH 4 DAYS Performed at [facility], [address], [place], [place] [address]    Report Status PENDING  Incomplete  Culture, blood (routine x 2)     Status: None (Preliminary result)   Collection Time: 07/25/19  3:49 PM   Specimen: BLOOD  Result Value Ref Range Status   Specimen Description BLOOD BLOOD RIGHT ARM  Final   Special Requests   Final    BOTTLES DRAWN AEROBIC AND ANAEROBIC Blood Culture adequate volume   Culture   Final    NO GROWTH 4 DAYS Performed at [facility], [address] [place],  [address]    Report Status PENDING  Incomplete     Radiology Studies: No results found.  Scheduled Meds: . apixaban  2.5 mg Oral BID  . vitamin C  500 mg Oral Daily  . aspirin EC  81 mg Oral Daily  . cephALEXin  500 mg Oral Q8H  . dexamethasone  6 mg Oral Daily  . docusate sodium  100 mg Oral Daily  . ferrous sulfate  325 mg Oral Daily  . gluconic acid-citric acid  30 mL Irrigation QPM  . insulin aspart  0-6 Units Subcutaneous TID WC  .  insulin aspart  2 Units Subcutaneous TID WC  . Ipratropium-Albuterol  1 puff Inhalation Q6H  . loratadine  10 mg Oral Daily  . pravastatin  20 mg Oral q1800  . sodium chloride flush  3 mL Intravenous Q12H  . [START ON 07/31/2019] Vitamin D (Ergocalciferol)  50,000 Units Oral Q Sun  . zinc sulfate  220 mg Oral Daily   Continuous Infusions: . sodium chloride    . albumin human 12.5 g (07/28/19 2240)  . furosemide (LASIX) infusion 4 mg/hr (07/28/19 1743)  . remdesivir 100 mg in NS 100 mL 100 mg (07/29/19 1229)     LOS: 4 days   Time spent: 40 minutes.  Lorella Nimrod, MD [facility]  If 7PM-7AM, please contact night-coverage [email or web address]  07/29/2019, 1:38 PM   This record has been created using Systems analyst. Errors have been sought and corrected,but may not always be located. Such creation errors do not reflect on the standard of care.

## 2019-07-29 NOTE — Care Management Important Message (Signed)
Important Message  Patient Details  Name: Miguel Torres MRN: [id number] Date of Birth: [birth year]   Medicare Important Message Given:  Yes     Shelbie Hutching, RN 07/29/2019, 9:29 AM

## 2019-07-30 DIAGNOSIS — Z515 Encounter for palliative care: Secondary | ICD-10-CM

## 2019-07-30 DIAGNOSIS — Z7189 Other specified counseling: Secondary | ICD-10-CM

## 2019-07-30 LAB — CBC WITH DIFFERENTIAL/PLATELET
Abs Immature Granulocytes: 0.55 10*3/uL — ABNORMAL HIGH (ref 0.00–0.07)
Basophils Absolute: 0.1 10*3/uL (ref 0.0–0.1)
Basophils Relative: 1 %
Eosinophils Absolute: 0 10*3/uL (ref 0.0–0.5)
Eosinophils Relative: 0 %
HCT: 29.5 % — ABNORMAL LOW (ref 39.0–52.0)
Hemoglobin: 9.3 g/dL — ABNORMAL LOW (ref 13.0–17.0)
Immature Granulocytes: 6 %
Lymphocytes Relative: 4 %
Lymphs Abs: 0.4 10*3/uL — ABNORMAL LOW (ref 0.7–4.0)
MCH: 30.5 pg (ref 26.0–34.0)
MCHC: 31.5 g/dL (ref 30.0–36.0)
MCV: 96.7 fL (ref 80.0–100.0)
Monocytes Absolute: 0.8 10*3/uL (ref 0.1–1.0)
Monocytes Relative: 9 %
Neutro Abs: 7.6 10*3/uL (ref 1.7–7.7)
Neutrophils Relative %: 80 %
Platelets: 172 10*3/uL (ref 150–400)
RBC: 3.05 MIL/uL — ABNORMAL LOW (ref 4.22–5.81)
RDW: 14.2 % (ref 11.5–15.5)
Smear Review: NORMAL
WBC: 9.4 10*3/uL (ref 4.0–10.5)
nRBC: 0 % (ref 0.0–0.2)

## 2019-07-30 LAB — URINE CULTURE: Culture: >=100000 — AB

## 2019-07-30 LAB — COMPREHENSIVE METABOLIC PANEL
ALT: 16 U/L (ref 0–44)
AST: 18 U/L (ref 15–41)
Albumin: 2.9 g/dL — ABNORMAL LOW (ref 3.5–5.0)
Alkaline Phosphatase: 93 U/L (ref 38–126)
Anion gap: 12 (ref 5–15)
BUN: 89 mg/dL — ABNORMAL HIGH (ref 8–23)
CO2: 25 mmol/L (ref 22–32)
Calcium: 8.1 mg/dL — ABNORMAL LOW (ref 8.9–10.3)
Chloride: 101 mmol/L (ref 98–111)
Creatinine, Ser: 2.28 mg/dL — ABNORMAL HIGH (ref 0.61–1.24)
GFR calc Af Amer: 29 mL/min — ABNORMAL LOW (ref >60)
GFR calc non Af Amer: 25 mL/min — ABNORMAL LOW (ref >60)
Glucose, Bld: 284 mg/dL — ABNORMAL HIGH (ref 70–99)
Potassium: 4.2 mmol/L (ref 3.5–5.1)
Sodium: 138 mmol/L (ref 135–145)
Total Bilirubin: 0.7 mg/dL (ref 0.3–1.2)
Total Protein: 6.1 g/dL — ABNORMAL LOW (ref 6.5–8.1)

## 2019-07-30 LAB — CULTURE, BLOOD (ROUTINE X 2)
Culture: NO GROWTH
Culture: NO GROWTH
Special Requests: ADEQUATE
Special Requests: ADEQUATE

## 2019-07-30 LAB — C-REACTIVE PROTEIN: CRP: 4.9 mg/dL — ABNORMAL HIGH (ref <1.0)

## 2019-07-30 LAB — GLUCOSE, CAPILLARY
Glucose-Capillary: 235 mg/dL — ABNORMAL HIGH (ref 70–99)
Glucose-Capillary: 211 mg/dL — ABNORMAL HIGH (ref 70–99)
Glucose-Capillary: 226 mg/dL — ABNORMAL HIGH (ref 70–99)
Glucose-Capillary: 380 mg/dL — ABNORMAL HIGH (ref 70–99)
Glucose-Capillary: 434 mg/dL — ABNORMAL HIGH (ref 70–99)

## 2019-07-30 LAB — FIBRIN DERIVATIVES D-DIMER (ARMC ONLY): Fibrin derivatives D-dimer (ARMC): 3606.16 ng/mL (FEU) — ABNORMAL HIGH (ref 0.00–499.00)

## 2019-07-30 LAB — PHOSPHORUS: Phosphorus: 5.2 mg/dL — ABNORMAL HIGH (ref 2.5–4.6)

## 2019-07-30 LAB — MAGNESIUM: Magnesium: 2.4 mg/dL (ref 1.7–2.4)

## 2019-07-30 MED ORDER — CEPHALEXIN 250 MG PO CAPS
250.0000 mg | ORAL_CAPSULE | Freq: Three times a day (TID) | ORAL | Status: AC
  Administered 2019-07-30 – 2019-08-02 (×10): 250 mg via ORAL
  Filled 2019-07-30 (×10): qty 1

## 2019-07-30 MED ORDER — INSULIN ASPART 100 UNIT/ML ~~LOC~~ SOLN
6.0000 [IU] | Freq: Once | SUBCUTANEOUS | Status: AC
  Administered 2019-07-30: 6 [IU] via SUBCUTANEOUS
  Filled 2019-07-30: qty 1

## 2019-07-30 MED ORDER — INSULIN ASPART 100 UNIT/ML ~~LOC~~ SOLN: 0.0000 [IU] | Freq: Three times a day (TID) | SUBCUTANEOUS | Status: DC

## 2019-07-30 NOTE — Progress Notes (Addendum)
Miguel Torres  MRN: [id number]  DOB/AGE: [birth year] 86 y.o.  Primary Care Physician:[name], [name], MD  Admit date: 07/25/2019  Chief Complaint:  Chief Complaint  Patient presents with  . positive covid    S-Pt presented on  07/25/2019 with  Chief Complaint  Patient presents with  . positive covid  . Patient is laying comfortably in the bed.  Patient offers no specific concerns. Patient today feels better  Medications . apixaban  2.5 mg Oral BID  . vitamin C  500 mg Oral Daily  . aspirin EC  81 mg Oral Daily  . cephALEXin  500 mg Oral Q8H  . dexamethasone  6 mg Oral Daily  . docusate sodium  100 mg Oral Daily  . ferrous sulfate  325 mg Oral Daily  . gluconic acid-citric acid  30 mL Irrigation QPM  . insulin aspart  0-6 Units Subcutaneous TID WC  . insulin aspart  2 Units Subcutaneous TID WC  . Ipratropium-Albuterol  1 puff Inhalation Q6H  . loratadine  10 mg Oral Daily  . pravastatin  20 mg Oral q1800  . sodium chloride flush  3 mL Intravenous Q12H  . [START ON 07/31/2019] Vitamin D (Ergocalciferol)  50,000 Units Oral Q Sun  . zinc sulfate  220 mg Oral Daily         BTD:VVOHY from the symptoms mentioned above,there are no other symptoms referable to all systems reviewed.  Physical Exam: Vital signs in last 24 hours: Temp:  [97.7 F (36.5 C)-98.6 F (37 C)] 97.7 F (36.5 C) (03/27 0902) Pulse Rate:  [99-118] 118 (03/27 0902) Resp:  [12-21] 15 (03/27 0933) BP: (133-163)/(77-104) 133/88 (03/27 0902) SpO2:  [94 %-98 %] 94 % (03/27 0902) Weight change:  Last BM Date: 07/29/19  Intake/Output from previous day: 03/26 0701 - 03/27 0700 In: -  Out: 2800 [Urine:2800] Total I/O In: 360 [P.O.:360] Out: -    Physical Exam: General- pt is awake,alert, oriented to time place and person Resp- No acute REsp distress, CTA B/L NO Rhonchi CVS- S1S2 regular in rate and rhythm GIT- BS+, soft, NT, ND EXT- 1+ LE Edema, no cyanosis Right BKA-2+  Lab  Results: CBC Recent Labs    07/29/19 0430 07/30/19 0740  WBC 11.3* 9.4  HGB 9.6* 9.3*  HCT 30.5* 29.5*  PLT 200 172    BMET Recent Labs    07/29/19 0430 07/30/19 0740  NA 139 138  K 4.4 4.2  CL 103 101  CO2 22 25  GLUCOSE 174* 284*  BUN 86* 89*  CREATININE 2.29* 2.28*  CALCIUM 8.4* 8.1*   Creatinine trend 2021 1.7--> 2.9-->2.3 2019 1.7--2.5-acute kidney injury 2018 1.4 1217 1.5 2016 1.5--1.6 2014 1.3    MICRO Recent Results (from the past 240 hour(s))  SARS CORONAVIRUS 2 (TAT 6-24 HRS) Nasopharyngeal Nasopharyngeal Swab     Status: Abnormal   Collection Time: 07/22/19  1:05 PM   Specimen: Nasopharyngeal Swab  Result Value Ref Range Status   SARS Coronavirus 2 POSITIVE (A) NEGATIVE Final    Comment: (NOTE) SARS-CoV-2 target nucleic acids are DETECTED. The SARS-CoV-2 RNA is generally detectable in upper and lower respiratory specimens during the acute phase of infection. Positive results are indicative of the presence of SARS-CoV-2 RNA. Clinical correlation with patient history and other diagnostic information is  necessary to determine patient infection status. Positive results do not rule out bacterial infection or co-infection with other viruses.  The expected result is Negative. Fact Sheet for Patients: SugarRoll.be  Fact Sheet for Healthcare Providers: https://www.woods-mathews.com/ This test is not yet approved or cleared by the [place] FDA and  has been authorized for detection and/or diagnosis of SARS-CoV-2 by FDA under an Emergency Use Authorization (EUA). This EUA will remain  in effect (meaning this test can be used) for the duration of the COVID-19 declaration under Section 564(b)(1) of the Act, 21 U.S.C. se ction 360bbb-3(b)(1), unless the authorization is terminated or revoked sooner. Performed at [facility], [place] [address] [place], [place] [address]   Urine Culture     Status: Abnormal    Collection Time: 07/25/19  8:11 AM   Specimen: Urine, Random  Result Value Ref Range Status   Specimen Description   Final    URINE, RANDOM Performed at [facility], [address] [place], [place] [address]    Special Requests   Final    NONE Performed at [facility], [address] [place], [place] [address]    Culture (A)  Final    >=100,000 COLONIES/mL PROTEUS MIRABILIS 50,000 COLONIES/mL KLEBSIELLA PNEUMONIAE    Report Status 07/30/2019 FINAL  Final   Organism ID, Bacteria PROTEUS MIRABILIS (A)  Final   Organism ID, Bacteria KLEBSIELLA PNEUMONIAE (A)  Final      Susceptibility   Klebsiella pneumoniae - MIC*    AMPICILLIN RESISTANT Resistant     CEFAZOLIN <=4 SENSITIVE Sensitive     CEFTRIAXONE <=0.25 SENSITIVE Sensitive     CIPROFLOXACIN INTERMEDIATE Intermediate     GENTAMICIN <=1 SENSITIVE Sensitive     IMIPENEM <=0.25 SENSITIVE Sensitive     NITROFURANTOIN 128 RESISTANT Resistant     TRIMETH/SULFA <=20 SENSITIVE Sensitive     AMPICILLIN/SULBACTAM 4 SENSITIVE Sensitive     PIP/TAZO <=4 SENSITIVE Sensitive     * 50,000 COLONIES/mL KLEBSIELLA PNEUMONIAE   Proteus mirabilis - MIC*    AMPICILLIN <=2 SENSITIVE Sensitive     CEFAZOLIN <=4 SENSITIVE Sensitive     CEFTRIAXONE <=0.25 SENSITIVE Sensitive     CIPROFLOXACIN >=4 RESISTANT Resistant     GENTAMICIN <=1 SENSITIVE Sensitive     IMIPENEM 2 SENSITIVE Sensitive     NITROFURANTOIN 128 RESISTANT Resistant     TRIMETH/SULFA 40 SENSITIVE Sensitive     AMPICILLIN/SULBACTAM <=2 SENSITIVE Sensitive     PIP/TAZO <=4 SENSITIVE Sensitive     * >=100,000 COLONIES/mL PROTEUS MIRABILIS  Culture, blood (routine x 2)     Status: None   Collection Time: 07/25/19  3:49 PM   Specimen: BLOOD  Result Value Ref Range Status   Specimen Description BLOOD BLOOD RIGHT WRIST  Final   Special Requests   Final    BOTTLES DRAWN AEROBIC AND ANAEROBIC Blood Culture adequate volume   Culture   Final    NO GROWTH 5  DAYS Performed at [facility], [address] [place], [place] [address]    Report Status 07/30/2019 FINAL  Final  Culture, blood (routine x 2)     Status: None   Collection Time: 07/25/19  3:49 PM   Specimen: BLOOD  Result Value Ref Range Status   Specimen Description BLOOD BLOOD RIGHT ARM  Final   Special Requests   Final    BOTTLES DRAWN AEROBIC AND ANAEROBIC Blood Culture adequate volume   Culture   Final    NO GROWTH 5 DAYS Performed at [facility], [address], [place], [place] [address]    Report Status 07/30/2019 FINAL  Final      Lab  Results  Component Value Date   PTH 117 (H) 06/26/2019   CALCIUM 8.1 (L) 07/30/2019   PHOS 5.2 (H) 07/30/2019          Impression:  Pt is a 86 y.o. caucasian  male with past medical history of hypertension, atrial fibrillation, diabetes, chronic kidney disease, peripheral vascular disease, right BKA,, who was admitted on 07/25/2019 with Elevated troponin,Acute on chronic diastolic CHF (congestive heart failure),COVID-19 virus infection.  1)Renal  AKI secondary to ATN. Patient has nonoliguric ATN Patient AKI is most likely secondary to hypovolemia/hypotension. Patient had hypovolemia/hypotension most likely secondary to currently being Covid positive/concurrent infection. Patient has AKI on CKD. Patient has CKD stage IIIb. Patient has CKD stage III since 2014 Patient has CKD most likely secondary to long-term history of hypertension/there could be possible contribution from age so she declined as patient is more than 86 years old.  Patient AKI is better Creatinine is trending down   2)HTN Patient blood pressure is currently stable  3)Anemia of chronic disease  HGb at goal (9--11) Patient is currently not on Epogen but will probably need soon  4) secondary hyperparathyroidism -CKD Mineral-Bone Disorder   Secondary Hyperparathyroidism present Phosphorus high No need of binders for now  5)  anasarca Patient is currently on  IV Lasix infusion.  Patient is also receiving IV albumin to help with diuresis.  Patient has had adequate response to IV diuresis .Patient is currently 4.6 L negative  6) electrolytes   sodium Normonatremic   potassium Normokalemic    7)Acid base Co2 at goal   8) Covid virus infection Patient is being closely followed for COVID-19 by the primary team  Plan:   We will continue the current IV diuresis We will continue to follow .    Edmond Ginsberg s Theador Hawthorne 07/30/2019, 9:54 AM

## 2019-07-30 NOTE — Progress Notes (Signed)
Bedside shift report received from [place], [place]. Morning assessment completed. Pt. Resting in bed, arouses to name calling. Denies any pain or needs at this time. Call light is within reach, will continue to monitor.

## 2019-07-30 NOTE — Consult Note (Signed)
PHARMACY NOTE:  ANTIMICROBIAL RENAL DOSAGE ADJUSTMENT  Current antimicrobial regimen includes a mismatch between antimicrobial dosage and estimated renal function.  As per policy approved by the Pharmacy & Therapeutics and Medical Executive Committees, the antimicrobial dosage will be adjusted accordingly.  Current antimicrobial dosage:  Keflex 500 mg q8H   Indication: UTI  Renal Function:  Estimated Creatinine Clearance: 28.4 mL/min (A) (by C-G formula based on SCr of 2.28 mg/dL (H)).    Antimicrobial dosage has been changed to:  Keflex 250 mg q8H   Thank you for allowing pharmacy to be a part of this patient's care.  Oswald Hillock, [facility] 07/30/2019 11:56 AM

## 2019-07-30 NOTE — Progress Notes (Signed)
Pt's blood glucose was 434 NP Randol Kern notified at this time.

## 2019-07-30 NOTE — Progress Notes (Signed)
PROGRESS NOTE    Miguel Torres  GTX:[id number] DOB: Jul 19, 1930 DOA: 07/25/2019 PCP: [name], MD   Brief Narrative:  Miguel Torres is a 86 y.o. male with medical history significant for hypertension, dyslipidemia, diabetes mellitus with complications of stage IV chronic kidney disease, history of peripheral vascular disease status post right BKA, history of atrial flutter on Eliquis and iron deficiency anemia.  He was sent to the emergency room for evaluation of shortness of breath. Patient was recently discharged from the hospital on 07/01/19 after hospitalization for CHF. He recently tested  positive for COVID 19 virus following routine testing at the [facility] where he [facility]. Due to increased work of breathing EMS was called and he was brought to the ER. He was found to have a pulse oximetry of 90% on room air and was placed on 2L of oxygen. He has a new cough but denies having any fevers or chills.  Subjective: Patient appears more lethargic.  Just opening his eyes and not communicating at all.  Assessment & Plan:   Principal Problem:   Acute on chronic diastolic CHF (congestive heart failure) (HCC) Active Problems:   Anemia of chronic disease   CKD (chronic kidney disease) stage 4, GFR 15-29 ml/min (HCC)   Diabetes mellitus with nephropathy (HCC)   Atrial flutter (HCC)   PAD (peripheral artery disease) ([place])   COVID-19 virus infection   Unstageable pressure ulcer of sacral region ([place])  Acute on chronic diastolic heart failure.  Patient appears volume overloaded with anasarca. Hypoxia which improves with supplemental oxygen.  He was initially diuresed with IV Lasix and then switched to Lasix infusion for gentle diuresis by nephrology. Cardiology was consulted-they do not recommend any further work-up. -Continue diuresis with Lasix gtt. -Monitor BMP. -Strict intake and output.-Negative 4L. -Daily weight- -Palliative care consult as patient has poor prognosis with  multiple comorbidities.  They started the discussion about goals of care with his son, family need more time to decide.  Currently he will remain full scope of medical care. Discussed again today with son and he agreed to make him DNR with full scope of medical care.  Patient seems deteriorating.  COVID-19 infection.  Patient with mild hypoxia.  Chest x-ray with mild congestive heart failure no infiltrate.  Markedly elevated inflammatory markers, started improving.  Negative blood cultures.  UA looked infected. Mild hypothermia with positive leukocytosis. - urine culture-Proteus mirabilis. - PCT- 0.52>>0.39 -Continue remdesivir-5/5 -Continue Decadron for 10 days.5/10. -Continue to monitor inflammatory markers. -Oxygen supplement as needed to keep saturation above 90%.  UTI.  Urine culture with Proteus mirabilis with good sensitivity, resistant to nitrofurantoin.  He was started on ceftriaxone. -De-escalate antibiotics to Keflex for 7 days. -Suprapubic catheter with some purulent discharge.  Elevated troponin.  Most likely secondary to demand.  Denies any chest pain. Trending down.  He was started on heparin infusion on admission and it was discontinued after 24-hour. -Continue to monitor.  Diabetes mellitus with stage 4 CKD. -Continue with SSI and sensitive mealtime coverage. -Might need increase in insulin as patient is on steroid now.  AKI with CKD stage IV.  Creatinine limited at 2.58 on admission with variation since then.  Baseline of 1.7-1.8.  Currently stable around 2.28 -Nephrology was consulted and they are recommending Lasix infusion and IV albumin.  Lasix infusion rate was increased 4-6 yesterday.  Improvement in his swelling and urinary output. -Continue to monitor. -Avoid nephrotoxins  History of Atrial Flutter.  Heart rate elevated today. -  Start him on low-dose metoprolol. -Continue home dose of apixaban.  Peripheral arterial disease.  S/p right BKA Patient has an  ulcer on left leg.  Lower extremity ultrasound with abnormal toe brachial index. -Vascular surgery was consulted-they do not have much to offer at this time. -Wound care consult.  Objective: Vitals:   07/29/19 1600 07/29/19 2351 07/30/19 0902 07/30/19 0933  BP: (!) 158/82 (!) 139/104 133/88   Pulse: 99 (!) 110 (!) 118   Resp: 12 16 (!) 21 15  Temp: 98.6 F (37 C) 97.9 F (36.6 C) 97.7 F (36.5 C)   TempSrc: Oral Oral Oral   SpO2: 97% 98% 94%   Weight:      Height:        Intake/Output Summary (Last 24 hours) at 07/30/2019 1533 Last data filed at 07/30/2019 1017 Gross per 24 hour  Intake 360 ml  Output 2200 ml  Net -1840 ml   Filed Weights   07/26/19 0500 07/27/19 0500 07/28/19 0300  Weight: 126.5 kg 113.8 kg 113 kg    Examination:  General exam: Chronically ill-appearing elderly man, appears lethargic but comfortable  Respiratory system: Clear to auscultation with decreased breath sounds at bases, Respiratory effort normal. Cardiovascular system: S1 & S2 heard, RRR. No JVD, murmurs, rubs, gallops or clicks. Gastrointestinal system: Soft, nontender, nondistended, bowel sounds positive. Central nervous system: Appears lethargic. No focal neurological deficits. Extremities: Right BKA, left lower extremity with Unna boot, generalized anasarca. Skin: Multiple skin lesions all over. Psychiatry: Judgement and insight appear impaired.  DVT prophylaxis: Eliquis Code Status: Full Family Communication: Son was updated on phone.  We discussed that he continued to deteriorate, appears more lethargic.  He agrees to made him DNR with full scope of medical care. Disposition Plan: Pending improvement.  Currently patient is on remdesivir and Lasix infusion.  High risk for deterioration due to multiple comorbidities.  Palliative care was consulted, ongoing discussion regarding goals of care.  Family wants some time to decide between continuation of care/comfort measures. He was made DNR  with full scope of medical care today.  Consultants:   Cardiology  Nephrology  Palliative care  Vascular surgery  Procedures:  Antimicrobials:  Keflex  Data Reviewed: I have personally reviewed following labs and imaging studies  CBC: Recent Labs  Lab 07/25/19 1325 07/25/19 1325 07/26/19 0103 07/27/19 0550 07/28/19 0529 07/29/19 0430 07/30/19 0740  WBC 19.0*   < > 14.7* 10.6* 10.4 11.3* 9.4  NEUTROABS 16.9*  --   --  8.3* 8.6* 9.3* 7.6  HGB 9.8*   < > 9.9* 9.1* 9.5* 9.6* 9.3*  HCT 30.1*   < > 30.5* 28.2* 30.6* 30.5* 29.5*  MCV 94.7   < > 94.4 95.3 97.5 97.1 96.7  PLT 200   < > 200 209 202 200 172   < > = values in this interval not displayed.   Basic Metabolic Panel: Recent Labs  Lab 07/26/19 0103 07/27/19 0550 07/28/19 0529 07/29/19 0430 07/30/19 0740  NA 132* 134* 136 139 138  K 4.1 4.2 4.3 4.4 4.2  CL 99 101 101 103 101  CO2 24 24 23 22 25   GLUCOSE 85 250* 170* 174* 284*  BUN 64* 75* 80* 86* 89*  CREATININE 2.33* 2.38* 2.44* 2.29* 2.28*  CALCIUM 7.9* 7.9* 8.2* 8.4* 8.1*  MG  --  2.3 2.4 2.4 2.4  PHOS  --  5.3* 5.7* 5.0* 5.2*   GFR: Estimated Creatinine Clearance: 28.4 mL/min (A) (by C-G formula based on  SCr of 2.28 mg/dL (H)). Liver Function Tests: Recent Labs  Lab 07/26/19 0827 07/27/19 0550 07/28/19 0529 07/29/19 0430 07/30/19 0740  AST  --  35 24 21 18   ALT  --  28 23 19 16   ALKPHOS  --  147* 123 113 93  BILITOT  --  0.6 1.0 1.0 0.7  PROT  --  6.4* 6.5 6.4* 6.1*  ALBUMIN 2.4* 2.3* 2.7* 2.9* 2.9*   No results for input(s): LIPASE, AMYLASE in the last 168 hours. No results for input(s): AMMONIA in the last 168 hours. Coagulation Profile: Recent Labs  Lab 07/25/19 1707  INR 2.2*   Cardiac Enzymes: No results for input(s): CKTOTAL, CKMB, CKMBINDEX, TROPONINI in the last 168 hours. BNP (last 3 results) No results for input(s): PROBNP in the last 8760 hours. HbA1C: No results for input(s): HGBA1C in the last 72 hours. CBG: Recent  Labs  Lab 07/28/19 1618 07/28/19 2052 07/29/19 0749 07/29/19 0828 07/29/19 2204  GLUCAP 144* 139* 189* 172* 235*   Lipid Profile: No results for input(s): CHOL, HDL, LDLCALC, TRIG, CHOLHDL, LDLDIRECT in the last 72 hours. Thyroid Function Tests: No results for input(s): TSH, T4TOTAL, FREET4, T3FREE, THYROIDAB in the last 72 hours. Anemia Panel: No results for input(s): VITAMINB12, FOLATE, FERRITIN, TIBC, IRON, RETICCTPCT in the last 72 hours. Sepsis Labs: Recent Labs  Lab 07/25/19 1328 07/26/19 1622 07/27/19 0550 07/28/19 0529  PROCALCITON  --  0.55 0.52 0.39  LATICACIDVEN 1.0  --   --   --     Recent Results (from the past 240 hour(s))  SARS CORONAVIRUS 2 (TAT 6-24 HRS) Nasopharyngeal Nasopharyngeal Swab     Status: Abnormal   Collection Time: 07/22/19  1:05 PM   Specimen: Nasopharyngeal Swab  Result Value Ref Range Status   SARS Coronavirus 2 POSITIVE (A) NEGATIVE Final    Comment: (NOTE) SARS-CoV-2 target nucleic acids are DETECTED. The SARS-CoV-2 RNA is generally detectable in upper and lower respiratory specimens during the acute phase of infection. Positive results are indicative of the presence of SARS-CoV-2 RNA. Clinical correlation with patient history and other diagnostic information is  necessary to determine patient infection status. Positive results do not rule out bacterial infection or co-infection with other viruses.  The expected result is Negative. Fact Sheet for Patients: SugarRoll.be Fact Sheet for Healthcare Providers: https://www.woods-mathews.com/ This test is not yet approved or cleared by the [place] FDA and  has been authorized for detection and/or diagnosis of SARS-CoV-2 by FDA under an Emergency Use Authorization (EUA). This EUA will remain  in effect (meaning this test can be used) for the duration of the COVID-19 declaration under Section 564(b)(1) of the Act, 21 U.S.C. se ction  360bbb-3(b)(1), unless the authorization is terminated or revoked sooner. Performed at [facility], [place] [address], [place], [place] [address]   Urine Culture     Status: Abnormal   Collection Time: 07/25/19  8:11 AM   Specimen: Urine, Random  Result Value Ref Range Status   Specimen Description   Final    URINE, RANDOM Performed at [facility], [address] [place], [place] [address]    Special Requests   Final    NONE Performed at [facility], [address] [place],  [address]    Culture (A)  Final    >=100,000 COLONIES/mL PROTEUS MIRABILIS 50,000 COLONIES/mL KLEBSIELLA PNEUMONIAE    Report Status 07/30/2019 FINAL  Final   Organism ID, Bacteria PROTEUS MIRABILIS (A)  Final  Organism ID, Bacteria KLEBSIELLA PNEUMONIAE (A)  Final      Susceptibility   Klebsiella pneumoniae - MIC*    AMPICILLIN RESISTANT Resistant     CEFAZOLIN <=4 SENSITIVE Sensitive     CEFTRIAXONE <=0.25 SENSITIVE Sensitive     CIPROFLOXACIN INTERMEDIATE Intermediate     GENTAMICIN <=1 SENSITIVE Sensitive     IMIPENEM <=0.25 SENSITIVE Sensitive     NITROFURANTOIN 128 RESISTANT Resistant     TRIMETH/SULFA <=20 SENSITIVE Sensitive     AMPICILLIN/SULBACTAM 4 SENSITIVE Sensitive     PIP/TAZO <=4 SENSITIVE Sensitive     * 50,000 COLONIES/mL KLEBSIELLA PNEUMONIAE   Proteus mirabilis - MIC*    AMPICILLIN <=2 SENSITIVE Sensitive     CEFAZOLIN <=4 SENSITIVE Sensitive     CEFTRIAXONE <=0.25 SENSITIVE Sensitive     CIPROFLOXACIN >=4 RESISTANT Resistant     GENTAMICIN <=1 SENSITIVE Sensitive     IMIPENEM 2 SENSITIVE Sensitive     NITROFURANTOIN 128 RESISTANT Resistant     TRIMETH/SULFA 40 SENSITIVE Sensitive     AMPICILLIN/SULBACTAM <=2 SENSITIVE Sensitive     PIP/TAZO <=4 SENSITIVE Sensitive     * >=100,000 COLONIES/mL PROTEUS MIRABILIS  Culture, blood (routine x 2)     Status: None   Collection Time: 07/25/19  3:49 PM   Specimen: BLOOD  Result Value Ref Range  Status   Specimen Description BLOOD BLOOD RIGHT WRIST  Final   Special Requests   Final    BOTTLES DRAWN AEROBIC AND ANAEROBIC Blood Culture adequate volume   Culture   Final    NO GROWTH 5 DAYS Performed at [facility], [address] [place], [place] [address]    Report Status 07/30/2019 FINAL  Final  Culture, blood (routine x 2)     Status: None   Collection Time: 07/25/19  3:49 PM   Specimen: BLOOD  Result Value Ref Range Status   Specimen Description BLOOD BLOOD RIGHT ARM  Final   Special Requests   Final    BOTTLES DRAWN AEROBIC AND ANAEROBIC Blood Culture adequate volume   Culture   Final    NO GROWTH 5 DAYS Performed at [facility], [address], [place], [place] [address]    Report Status 07/30/2019 FINAL  Final     Radiology Studies: No results found.  Scheduled Meds: . apixaban  2.5 mg Oral BID  . vitamin C  500 mg Oral Daily  . aspirin EC  81 mg Oral Daily  . cephALEXin  250 mg Oral Q8H  . dexamethasone  6 mg Oral Daily  . docusate sodium  100 mg Oral Daily  . ferrous sulfate  325 mg Oral Daily  . gluconic acid-citric acid  30 mL Irrigation QPM  . insulin aspart  0-6 Units Subcutaneous TID WC  . insulin aspart  2 Units Subcutaneous TID WC  . Ipratropium-Albuterol  1 puff Inhalation Q6H  . loratadine  10 mg Oral Daily  . pravastatin  20 mg Oral q1800  . sodium chloride flush  3 mL Intravenous Q12H  . [START ON 07/31/2019] Vitamin D (Ergocalciferol)  50,000 Units Oral Q Sun  . zinc sulfate  220 mg Oral Daily   Continuous Infusions: . sodium chloride    . albumin human 12.5 g (07/30/19 1017)  . furosemide (LASIX) infusion 4 mg/hr (07/28/19 1743)     LOS: 5 days   Time spent: 40 minutes.  Lorella Nimrod, MD [facility]  If 7PM-7AM, please contact night-coverage [email or web address]  07/30/2019, 3:33  PM   This record has been created using Systems analyst. Errors have been sought and corrected,but may not  always be located. Such creation errors do not reflect on the standard of care.

## 2019-07-31 LAB — GLUCOSE, CAPILLARY
Glucose-Capillary: 467 mg/dL — ABNORMAL HIGH (ref 70–99)
Glucose-Capillary: 502 mg/dL (ref 70–99)
Glucose-Capillary: 399 mg/dL — ABNORMAL HIGH (ref 70–99)
Glucose-Capillary: 317 mg/dL — ABNORMAL HIGH (ref 70–99)
Glucose-Capillary: 277 mg/dL — ABNORMAL HIGH (ref 70–99)
Glucose-Capillary: 326 mg/dL — ABNORMAL HIGH (ref 70–99)

## 2019-07-31 LAB — MAGNESIUM: Magnesium: 2.1 mg/dL (ref 1.7–2.4)

## 2019-07-31 LAB — COMPREHENSIVE METABOLIC PANEL
ALT: 17 U/L (ref 0–44)
AST: 17 U/L (ref 15–41)
Albumin: 3 g/dL — ABNORMAL LOW (ref 3.5–5.0)
Alkaline Phosphatase: 87 U/L (ref 38–126)
Anion gap: 9 (ref 5–15)
BUN: 93 mg/dL — ABNORMAL HIGH (ref 8–23)
CO2: 27 mmol/L (ref 22–32)
Calcium: 8.1 mg/dL — ABNORMAL LOW (ref 8.9–10.3)
Chloride: 100 mmol/L (ref 98–111)
Creatinine, Ser: 1.97 mg/dL — ABNORMAL HIGH (ref 0.61–1.24)
GFR calc Af Amer: 35 mL/min — ABNORMAL LOW (ref >60)
GFR calc non Af Amer: 30 mL/min — ABNORMAL LOW (ref >60)
Glucose, Bld: 523 mg/dL (ref 70–99)
Potassium: 4.2 mmol/L (ref 3.5–5.1)
Sodium: 136 mmol/L (ref 135–145)
Total Bilirubin: 0.5 mg/dL (ref 0.3–1.2)
Total Protein: 6.2 g/dL — ABNORMAL LOW (ref 6.5–8.1)

## 2019-07-31 LAB — PHOSPHORUS: Phosphorus: 4.1 mg/dL (ref 2.5–4.6)

## 2019-07-31 MED ORDER — INSULIN ASPART 100 UNIT/ML ~~LOC~~ SOLN
0.0000 [IU] | Freq: Three times a day (TID) | SUBCUTANEOUS | Status: DC
  Administered 2019-07-31: 10 [IU] via SUBCUTANEOUS
  Administered 2019-07-31 (×2): 11 [IU] via SUBCUTANEOUS
  Administered 2019-08-01: 18:00:00 8 [IU] via SUBCUTANEOUS
  Administered 2019-08-01 (×2): 11 [IU] via SUBCUTANEOUS
  Administered 2019-08-01: 15 [IU] via SUBCUTANEOUS
  Administered 2019-08-01: 18:00:00 3 [IU] via SUBCUTANEOUS
  Administered 2019-08-02: 13:00:00 20 [IU] via SUBCUTANEOUS
  Administered 2019-08-02 (×2): 15 [IU] via SUBCUTANEOUS
  Administered 2019-08-02 – 2019-08-03 (×2): 8 [IU] via SUBCUTANEOUS
  Administered 2019-08-03: 21:00:00 15 [IU] via SUBCUTANEOUS
  Administered 2019-08-03: 10:00:00 5 [IU] via SUBCUTANEOUS
  Administered 2019-08-03: 8 [IU] via SUBCUTANEOUS
  Administered 2019-08-04: 23:00:00 5 [IU] via SUBCUTANEOUS
  Administered 2019-08-04: 2 [IU] via SUBCUTANEOUS
  Administered 2019-08-04 – 2019-08-05 (×4): 3 [IU] via SUBCUTANEOUS
  Administered 2019-08-06: 18:00:00 5 [IU] via SUBCUTANEOUS
  Administered 2019-08-06: 3 [IU] via SUBCUTANEOUS
  Administered 2019-08-06: 22:00:00 8 [IU] via SUBCUTANEOUS
  Administered 2019-08-07: 5 [IU] via SUBCUTANEOUS
  Administered 2019-08-07: 11 [IU] via SUBCUTANEOUS
  Administered 2019-08-07: 3 [IU] via SUBCUTANEOUS
  Administered 2019-08-08: 12:00:00 8 [IU] via SUBCUTANEOUS
  Filled 2019-07-31 (×25): qty 1

## 2019-07-31 MED ORDER — INSULIN ASPART 100 UNIT/ML ~~LOC~~ SOLN
10.0000 [IU] | Freq: Once | SUBCUTANEOUS | Status: AC
  Administered 2019-07-31: 12:00:00 10 [IU] via SUBCUTANEOUS
  Filled 2019-07-31: qty 1

## 2019-07-31 MED ORDER — INSULIN GLARGINE 100 UNIT/ML ~~LOC~~ SOLN
10.0000 [IU] | Freq: Two times a day (BID) | SUBCUTANEOUS | Status: DC
  Administered 2019-07-31 – 2019-08-01 (×3): 10 [IU] via SUBCUTANEOUS
  Filled 2019-07-31 (×4): qty 0.1

## 2019-07-31 MED ORDER — INSULIN ASPART 100 UNIT/ML ~~LOC~~ SOLN
20.0000 [IU] | Freq: Once | SUBCUTANEOUS | Status: AC
  Administered 2019-07-31: 07:00:00 20 [IU] via SUBCUTANEOUS
  Filled 2019-07-31: qty 1

## 2019-07-31 MED ORDER — INSULIN ASPART 100 UNIT/ML ~~LOC~~ SOLN: 15.0000 [IU] | Freq: Once | SUBCUTANEOUS | Status: DC

## 2019-07-31 MED ORDER — INSULIN ASPART 100 UNIT/ML ~~LOC~~ SOLN: 0.0000 [IU] | Freq: Three times a day (TID) | SUBCUTANEOUS | Status: DC

## 2019-07-31 MED ORDER — INSULIN ASPART 100 UNIT/ML ~~LOC~~ SOLN
20.0000 [IU] | Freq: Once | SUBCUTANEOUS | Status: AC
  Administered 2019-07-31: 20 [IU] via SUBCUTANEOUS
  Filled 2019-07-31: qty 1

## 2019-07-31 NOTE — Progress Notes (Signed)
Miguel Torres  MRN: [id number]  DOB/AGE: 08/24/1930 86 y.o.  Primary Care Physician:[name], [name], MD  Admit date: 07/25/2019  Chief Complaint:  Chief Complaint  Patient presents with  . positive covid    S-Pt presented on  07/25/2019 with  Chief Complaint  Patient presents with  . positive covid  . Patient is laying comfortably in the bed.  Patient offers no specific concerns.   Medications . apixaban  2.5 mg Oral BID  . vitamin C  500 mg Oral Daily  . aspirin EC  81 mg Oral Daily  . cephALEXin  250 mg Oral Q8H  . dexamethasone  6 mg Oral Daily  . docusate sodium  100 mg Oral Daily  . ferrous sulfate  325 mg Oral Daily  . gluconic acid-citric acid  30 mL Irrigation QPM  . insulin aspart  0-15 Units Subcutaneous TID AC & HS  . insulin aspart  2 Units Subcutaneous TID WC  . Ipratropium-Albuterol  1 puff Inhalation Q6H  . loratadine  10 mg Oral Daily  . pravastatin  20 mg Oral q1800  . sodium chloride flush  3 mL Intravenous Q12H  . Vitamin D (Ergocalciferol)  50,000 Units Oral Q Sun  . zinc sulfate  220 mg Oral Daily         UPJ:SRPRX from the symptoms mentioned above,there are no other symptoms referable to all systems reviewed.  Physical Exam: Vital signs in last 24 hours: Temp:  [97.1 F (36.2 C)-97.6 F (36.4 C)] 97.1 F (36.2 C) (03/28 0730) Pulse Rate:  [55-117] 78 (03/28 0730) Resp:  [14-22] 14 (03/28 0730) BP: (120-158)/(60-80) 158/60 (03/28 0730) SpO2:  [93 %-100 %] 94 % (03/28 0730) Weight change:  Last BM Date: 07/29/19  Intake/Output from previous day: 03/27 0701 - 03/28 0700 In: 600 [P.O.:600] Out: 2400 [Urine:2400] Total I/O In: -  Out: 1200 [Urine:1200]   Physical Exam: General- pt is awake,alert, oriented to time place and person Resp- No acute REsp distress, CTA B/L NO Rhonchi CVS- S1S2 regular in rate and rhythm GIT- BS+, soft, NT, ND EXT- 1+ LE Edema, no cyanosis Right BKA-2+ edema in the stump  Lab  Results: CBC Recent Labs    07/29/19 0430 07/30/19 0740  WBC 11.3* 9.4  HGB 9.6* 9.3*  HCT 30.5* 29.5*  PLT 200 172    BMET Recent Labs    07/30/19 0740 07/31/19 0432  NA 138 136  K 4.2 4.2  CL 101 100  CO2 25 27  GLUCOSE 284* 523*  BUN 89* 93*  CREATININE 2.28* 1.97*  CALCIUM 8.1* 8.1*   Creatinine trend 2021 1.7==>  2.9==>2.3=>1.9 2019 1.7--2.5-acute kidney injury 2018 1.4 1217 1.5 2016 1.5--1.6 2014 1.3    MICRO Recent Results (from the past 240 hour(s))  SARS CORONAVIRUS 2 (TAT 6-24 HRS) Nasopharyngeal Nasopharyngeal Swab     Status: Abnormal   Collection Time: 07/22/19  1:05 PM   Specimen: Nasopharyngeal Swab  Result Value Ref Range Status   SARS Coronavirus 2 POSITIVE (A) NEGATIVE Final    Comment: (NOTE) SARS-CoV-2 target nucleic acids are DETECTED. The SARS-CoV-2 RNA is generally detectable in upper and lower respiratory specimens during the acute phase of infection. Positive results are indicative of the presence of SARS-CoV-2 RNA. Clinical correlation with patient history and other diagnostic information is  necessary to determine patient infection status. Positive results do not rule out bacterial infection or co-infection with other viruses.  The expected result is Negative. Fact Sheet for Patients:  SugarRoll.be Fact Sheet for Healthcare Providers: https://www.woods-mathews.com/ This test is not yet approved or cleared by the [place] FDA and  has been authorized for detection and/or diagnosis of SARS-CoV-2 by FDA under an Emergency Use Authorization (EUA). This EUA will remain  in effect (meaning this test can be used) for the duration of the COVID-19 declaration under Section 564(b)(1) of the Act, 21 U.S.C. se ction 360bbb-3(b)(1), unless the authorization is terminated or revoked sooner. Performed at [facility], [place] [address], [place], [place] [address]   Urine Culture     Status:  Abnormal   Collection Time: 07/25/19  8:11 AM   Specimen: Urine, Random  Result Value Ref Range Status   Specimen Description   Final    URINE, RANDOM Performed at [facility], [address] [place], [place] [address]    Special Requests   Final    NONE Performed at [facility], [address] [place], [place] [address]    Culture (A)  Final    >=100,000 COLONIES/mL PROTEUS MIRABILIS 50,000 COLONIES/mL KLEBSIELLA PNEUMONIAE    Report Status 07/30/2019 FINAL  Final   Organism ID, Bacteria PROTEUS MIRABILIS (A)  Final   Organism ID, Bacteria KLEBSIELLA PNEUMONIAE (A)  Final      Susceptibility   Klebsiella pneumoniae - MIC*    AMPICILLIN RESISTANT Resistant     CEFAZOLIN <=4 SENSITIVE Sensitive     CEFTRIAXONE <=0.25 SENSITIVE Sensitive     CIPROFLOXACIN INTERMEDIATE Intermediate     GENTAMICIN <=1 SENSITIVE Sensitive     IMIPENEM <=0.25 SENSITIVE Sensitive     NITROFURANTOIN 128 RESISTANT Resistant     TRIMETH/SULFA <=20 SENSITIVE Sensitive     AMPICILLIN/SULBACTAM 4 SENSITIVE Sensitive     PIP/TAZO <=4 SENSITIVE Sensitive     * 50,000 COLONIES/mL KLEBSIELLA PNEUMONIAE   Proteus mirabilis - MIC*    AMPICILLIN <=2 SENSITIVE Sensitive     CEFAZOLIN <=4 SENSITIVE Sensitive     CEFTRIAXONE <=0.25 SENSITIVE Sensitive     CIPROFLOXACIN >=4 RESISTANT Resistant     GENTAMICIN <=1 SENSITIVE Sensitive     IMIPENEM 2 SENSITIVE Sensitive     NITROFURANTOIN 128 RESISTANT Resistant     TRIMETH/SULFA 40 SENSITIVE Sensitive     AMPICILLIN/SULBACTAM <=2 SENSITIVE Sensitive     PIP/TAZO <=4 SENSITIVE Sensitive     * >=100,000 COLONIES/mL PROTEUS MIRABILIS  Culture, blood (routine x 2)     Status: None   Collection Time: 07/25/19  3:49 PM   Specimen: BLOOD  Result Value Ref Range Status   Specimen Description BLOOD BLOOD RIGHT WRIST  Final   Special Requests   Final    BOTTLES DRAWN AEROBIC AND ANAEROBIC Blood Culture adequate volume   Culture   Final    NO  GROWTH 5 DAYS Performed at [facility], [address], [place], [place] [address]    Report Status 07/30/2019 FINAL  Final  Culture, blood (routine x 2)     Status: None   Collection Time: 07/25/19  3:49 PM   Specimen: BLOOD  Result Value Ref Range Status   Specimen Description BLOOD BLOOD RIGHT ARM  Final   Special Requests   Final    BOTTLES DRAWN AEROBIC AND ANAEROBIC Blood Culture adequate volume   Culture   Final    NO GROWTH 5 DAYS Performed at [facility], [address], [place], [place] [address]    Report Status 07/30/2019 FINAL  Final  Lab Results  Component Value Date   PTH 117 (H) 06/26/2019   CALCIUM 8.1 (L) 07/31/2019   PHOS 4.1 07/31/2019          Impression:  Pt is a 86 y.o. caucasian  male with past medical history of hypertension, atrial fibrillation, diabetes, chronic kidney disease, peripheral vascular disease, right BKA,, who was admitted on 07/25/2019 with Elevated troponin,Acute on chronic diastolic CHF (congestive heart failure),COVID-19 virus infection.  1)Renal  AKI secondary to ATN. Patient has nonoliguric ATN Patient AKI is most likely secondary to hypovolemia/hypotension. Patient had hypovolemia/hypotension most likely secondary to currently being Covid positive/concurrent infection. Patient has AKI on CKD. Patient has CKD stage IIIb. Patient has CKD stage III since 2014 Patient has CKD most likely secondary to long-term history of hypertension/there could be possible contribution from age so she declined as patient is more than 86 years old.  Patient AKI is better Creatinine is trending down   2)HTN Patient blood pressure is currently stable  3)Anemia of chronic disease  HGb at goal (9--11) Patient is currently not on Epogen but will probably need soon  4) secondary hyperparathyroidism -CKD Mineral-Bone Disorder   Secondary Hyperparathyroidism present Phosphorus high No need of binders for now  5)  anasarca Patient is currently on  IV Lasix infusion.   Patient is also receiving IV albumin to help with diuresis.  Patient has had adequate response to IV diuresis . Patient is currently 7.2 L negative   6) electrolytes   sodium Normonatremic   potassium Normokalemic    7)Acid base Co2 at goal   8) Covid virus infection Patient is being closely followed for COVID-19 by the primary team  Plan:   will most likely change diuretics to PO in the morning   Jesten Cappuccio s [facility] 07/31/2019, 9:13 AM

## 2019-07-31 NOTE — Progress Notes (Signed)
PROGRESS NOTE    Miguel Torres  OBS:[id number] DOB: 1930/09/26 DOA: 07/25/2019 PCP: [name], MD   Brief Narrative:  Miguel Torres is a 86 y.o. male with medical history significant for hypertension, dyslipidemia, diabetes mellitus with complications of stage IV chronic kidney disease, history of peripheral vascular disease status post right BKA, history of atrial flutter on Eliquis and iron deficiency anemia.  He was sent to the emergency room for evaluation of shortness of breath. Patient was recently discharged from the hospital on 07/01/19 after hospitalization for CHF. He recently tested  positive for COVID 19 virus following routine testing at the [facility] where he [facility]. Due to increased work of breathing EMS was called and he was brought to the ER. He was found to have a pulse oximetry of 90% on room air and was placed on 2L of oxygen. He has a new cough but denies having any fevers or chills.  Subjective: Patient appears lethargic, able to answer yes or no when asked.  Denies any new complaints.  Assessment & Plan:   Principal Problem:   Acute on chronic diastolic CHF (congestive heart failure) (HCC) Active Problems:   Anemia of chronic disease   CKD (chronic kidney disease) stage 4, GFR 15-29 ml/min (HCC)   Diabetes mellitus with nephropathy (HCC)   Atrial flutter (HCC)   PAD (peripheral artery disease) ([place])   COVID-19 virus infection   Unstageable pressure ulcer of sacral region ([place])  Acute on chronic diastolic heart failure.  Patient appears volume overloaded with anasarca. Hypoxia which improves with supplemental oxygen.  He was initially diuresed with IV Lasix and then switched to Lasix infusion for gentle diuresis by nephrology. Cardiology was consulted-they do not recommend any further work-up. -Continue diuresis with Lasix gtt. -Monitor BMP. -Strict intake and output.-Negative 7L. -Daily weight- -Palliative care consult as patient has poor prognosis  with multiple comorbidities.  They started the discussion about goals of care with his son, family need more time to decide.  Currently he will remain full scope of medical care. Discussed with son yesterday and he agreed to make him DNR with full scope of medical care.  Patient seems clinically deteriorating.  COVID-19 infection.  Patient with mild hypoxia.  Chest x-ray with mild congestive heart failure no infiltrate.  Markedly elevated inflammatory markers, started improving.  Negative blood cultures.  UA looked infected. Mild hypothermia with positive leukocytosis. - urine culture-Proteus mirabilis. - PCT- 0.52>>0.39 -Completed course of remdesivir -Continue Decadron for 10 days.6/10. -Continue to monitor inflammatory markers. -Oxygen supplement as needed to keep saturation above 90%.  UTI.  Urine culture with Proteus mirabilis with good sensitivity, resistant to nitrofurantoin.  He was started on ceftriaxone. -De-escalate antibiotics to Keflex for 7 days.  Elevated troponin.  Most likely secondary to demand.  Denies any chest pain. Trending down.  He was started on heparin infusion on admission and it was discontinued after 24-hour. -Continue to monitor.  Diabetes mellitus with stage 4 CKD. -Continue with SSI and sensitive mealtime coverage. -Might need increase in insulin as patient is on steroid now.  AKI with CKD stage IV.  Creatinine limited at 2.58 on admission with variation since then.  Baseline of 1.7-1.8. Starting improving with creatinine of 1.97 today -Nephrology was consulted and they are recommending Lasix infusion and IV albumin.   -Continue Lasix infusion. -Continue IV albumin to help with diuresis. -Continue to monitor. -Avoid nephrotoxins  History of Atrial Flutter.  Heart rate elevated today. -Start him on low-dose  metoprolol. -Continue home dose of apixaban.  Peripheral arterial disease.  S/p right BKA Patient has an ulcer on left leg.  Lower extremity  ultrasound with abnormal toe brachial index. -Vascular surgery was consulted-they do not have much to offer at this time. -Wound care consult.  Objective: Vitals:   07/30/19 1935 07/31/19 0040 07/31/19 0600 07/31/19 0730  BP:  (!) 157/80 (!) 158/80 (!) 158/60  Pulse:  (!) 101 (!) 55 78  Resp:  20 16 14   Temp:  (!) 97.3 F (36.3 C)  (!) 97.1 F (36.2 C)  TempSrc:  Axillary  Axillary  SpO2: 94% 93% 95% 94%  Weight:      Height:        Intake/Output Summary (Last 24 hours) at 07/31/2019 1542 Last data filed at 07/31/2019 1751 Gross per 24 hour  Intake 240 ml  Output 3200 ml  Net -2960 ml   Filed Weights   07/26/19 0500 07/27/19 0500 07/28/19 0300  Weight: 126.5 kg 113.8 kg 113 kg    Examination:  General exam: Chronically ill-appearing elderly man, appears lethargic but comfortable  Respiratory system: Clear to auscultation with decreased breath sounds at bases, Respiratory effort normal. Cardiovascular system: S1 & S2 heard, RRR. No JVD, murmurs, rubs, gallops or clicks. Gastrointestinal system: Soft, nontender, nondistended, bowel sounds positive. Central nervous system: Appears lethargic. No focal neurological deficits. Extremities: Right BKA, left lower extremity with Unna boot, generalized anasarca. Skin: Multiple skin lesions all over. Psychiatry: Judgement and insight appear impaired.  DVT prophylaxis: Eliquis Code Status: Full Family Communication: Son was updated on phone.   Disposition Plan: Pending improvement.  Currently patient is on remdesivir and Lasix infusion.  High risk for deterioration due to multiple comorbidities.  Palliative care was consulted, ongoing discussion regarding goals of care.  Family wants some time to decide between continuation of care/comfort measures. He was made DNR with full scope of medical care today.  Consultants:   Cardiology  Nephrology  Palliative care  Vascular surgery  Procedures:  Antimicrobials:  Keflex  Data  Reviewed: I have personally reviewed following labs and imaging studies  CBC: Recent Labs  Lab 07/25/19 1325 07/25/19 1325 07/26/19 0103 07/27/19 0550 07/28/19 0529 07/29/19 0430 07/30/19 0740  WBC 19.0*   < > 14.7* 10.6* 10.4 11.3* 9.4  NEUTROABS 16.9*  --   --  8.3* 8.6* 9.3* 7.6  HGB 9.8*   < > 9.9* 9.1* 9.5* 9.6* 9.3*  HCT 30.1*   < > 30.5* 28.2* 30.6* 30.5* 29.5*  MCV 94.7   < > 94.4 95.3 97.5 97.1 96.7  PLT 200   < > 200 209 202 200 172   < > = values in this interval not displayed.   Basic Metabolic Panel: Recent Labs  Lab 07/27/19 0550 07/28/19 0529 07/29/19 0430 07/30/19 0740 07/31/19 0432  NA 134* 136 139 138 136  K 4.2 4.3 4.4 4.2 4.2  CL 101 101 103 101 100  CO2 24 23 22 25 27   GLUCOSE 250* 170* 174* 284* 523*  BUN 75* 80* 86* 89* 93*  CREATININE 2.38* 2.44* 2.29* 2.28* 1.97*  CALCIUM 7.9* 8.2* 8.4* 8.1* 8.1*  MG 2.3 2.4 2.4 2.4 2.1  PHOS 5.3* 5.7* 5.0* 5.2* 4.1   GFR: Estimated Creatinine Clearance: 32.8 mL/min (A) (by C-G formula based on SCr of 1.97 mg/dL (H)). Liver Function Tests: Recent Labs  Lab 07/27/19 0550 07/28/19 0529 07/29/19 0430 07/30/19 0740 07/31/19 0432  AST 35 24 21 18 17   ALT  28 23 19 16 17   ALKPHOS 147* 123 113 93 87  BILITOT 0.6 1.0 1.0 0.7 0.5  PROT 6.4* 6.5 6.4* 6.1* 6.2*  ALBUMIN 2.3* 2.7* 2.9* 2.9* 3.0*   No results for input(s): LIPASE, AMYLASE in the last 168 hours. No results for input(s): AMMONIA in the last 168 hours. Coagulation Profile: Recent Labs  Lab 07/25/19 1707  INR 2.2*   Cardiac Enzymes: No results for input(s): CKTOTAL, CKMB, CKMBINDEX, TROPONINI in the last 168 hours. BNP (last 3 results) No results for input(s): PROBNP in the last 8760 hours. HbA1C: No results for input(s): HGBA1C in the last 72 hours. CBG: Recent Labs  Lab 07/30/19 2123 07/31/19 0739 07/31/19 0803 07/31/19 1023 07/31/19 1213  GLUCAP 434* 502* 467* 399* 317*   Lipid Profile: No results for input(s): CHOL, HDL,  LDLCALC, TRIG, CHOLHDL, LDLDIRECT in the last 72 hours. Thyroid Function Tests: No results for input(s): TSH, T4TOTAL, FREET4, T3FREE, THYROIDAB in the last 72 hours. Anemia Panel: No results for input(s): VITAMINB12, FOLATE, FERRITIN, TIBC, IRON, RETICCTPCT in the last 72 hours. Sepsis Labs: Recent Labs  Lab 07/25/19 1328 07/26/19 1622 07/27/19 0550 07/28/19 0529  PROCALCITON  --  0.55 0.52 0.39  LATICACIDVEN 1.0  --   --   --     Recent Results (from the past 240 hour(s))  SARS CORONAVIRUS 2 (TAT 6-24 HRS) Nasopharyngeal Nasopharyngeal Swab     Status: Abnormal   Collection Time: 07/22/19  1:05 PM   Specimen: Nasopharyngeal Swab  Result Value Ref Range Status   SARS Coronavirus 2 POSITIVE (A) NEGATIVE Final    Comment: (NOTE) SARS-CoV-2 target nucleic acids are DETECTED. The SARS-CoV-2 RNA is generally detectable in upper and lower respiratory specimens during the acute phase of infection. Positive results are indicative of the presence of SARS-CoV-2 RNA. Clinical correlation with patient history and other diagnostic information is  necessary to determine patient infection status. Positive results do not rule out bacterial infection or co-infection with other viruses.  The expected result is Negative. Fact Sheet for Patients: SugarRoll.be Fact Sheet for Healthcare Providers: https://www.woods-mathews.com/ This test is not yet approved or cleared by the [place] FDA and  has been authorized for detection and/or diagnosis of SARS-CoV-2 by FDA under an Emergency Use Authorization (EUA). This EUA will remain  in effect (meaning this test can be used) for the duration of the COVID-19 declaration under Section 564(b)(1) of the Act, 21 U.S.C. se ction 360bbb-3(b)(1), unless the authorization is terminated or revoked sooner. Performed at [facility], [place] [address], [place], [place] [address]   Urine Culture     Status:  Abnormal   Collection Time: 07/25/19  8:11 AM   Specimen: Urine, Random  Result Value Ref Range Status   Specimen Description   Final    URINE, RANDOM Performed at [facility], [address], [place], [place] [address]    Special Requests   Final    NONE Performed at [facility], [place],  [address]    Culture (A)  Final    >=100,000 COLONIES/mL PROTEUS MIRABILIS 50,000 COLONIES/mL KLEBSIELLA PNEUMONIAE    Report Status 07/30/2019 FINAL  Final   Organism ID, Bacteria PROTEUS MIRABILIS (A)  Final   Organism ID, Bacteria KLEBSIELLA PNEUMONIAE (A)  Final      Susceptibility   Klebsiella pneumoniae - MIC*    AMPICILLIN RESISTANT Resistant     CEFAZOLIN <=4 SENSITIVE Sensitive     CEFTRIAXONE <=0.25  SENSITIVE Sensitive     CIPROFLOXACIN INTERMEDIATE Intermediate     GENTAMICIN <=1 SENSITIVE Sensitive     IMIPENEM <=0.25 SENSITIVE Sensitive     NITROFURANTOIN 128 RESISTANT Resistant     TRIMETH/SULFA <=20 SENSITIVE Sensitive     AMPICILLIN/SULBACTAM 4 SENSITIVE Sensitive     PIP/TAZO <=4 SENSITIVE Sensitive     * 50,000 COLONIES/mL KLEBSIELLA PNEUMONIAE   Proteus mirabilis - MIC*    AMPICILLIN <=2 SENSITIVE Sensitive     CEFAZOLIN <=4 SENSITIVE Sensitive     CEFTRIAXONE <=0.25 SENSITIVE Sensitive     CIPROFLOXACIN >=4 RESISTANT Resistant     GENTAMICIN <=1 SENSITIVE Sensitive     IMIPENEM 2 SENSITIVE Sensitive     NITROFURANTOIN 128 RESISTANT Resistant     TRIMETH/SULFA 40 SENSITIVE Sensitive     AMPICILLIN/SULBACTAM <=2 SENSITIVE Sensitive     PIP/TAZO <=4 SENSITIVE Sensitive     * >=100,000 COLONIES/mL PROTEUS MIRABILIS  Culture, blood (routine x 2)     Status: None   Collection Time: 07/25/19  3:49 PM   Specimen: BLOOD  Result Value Ref Range Status   Specimen Description BLOOD BLOOD RIGHT WRIST  Final   Special Requests   Final    BOTTLES DRAWN AEROBIC AND ANAEROBIC Blood Culture adequate volume   Culture   Final    NO  GROWTH 5 DAYS Performed at [facility], [address] [place], [place] [address]    Report Status 07/30/2019 FINAL  Final  Culture, blood (routine x 2)     Status: None   Collection Time: 07/25/19  3:49 PM   Specimen: BLOOD  Result Value Ref Range Status   Specimen Description BLOOD BLOOD RIGHT ARM  Final   Special Requests   Final    BOTTLES DRAWN AEROBIC AND ANAEROBIC Blood Culture adequate volume   Culture   Final    NO GROWTH 5 DAYS Performed at [facility], [address], [place], [place] [address]    Report Status 07/30/2019 FINAL  Final     Radiology Studies: No results found.  Scheduled Meds: . apixaban  2.5 mg Oral BID  . vitamin C  500 mg Oral Daily  . aspirin EC  81 mg Oral Daily  . cephALEXin  250 mg Oral Q8H  . dexamethasone  6 mg Oral Daily  . docusate sodium  100 mg Oral Daily  . ferrous sulfate  325 mg Oral Daily  . gluconic acid-citric acid  30 mL Irrigation QPM  . insulin aspart  0-15 Units Subcutaneous TID AC & HS  . insulin aspart  2 Units Subcutaneous TID WC  . insulin glargine  10 Units Subcutaneous BID  . Ipratropium-Albuterol  1 puff Inhalation Q6H  . loratadine  10 mg Oral Daily  . pravastatin  20 mg Oral q1800  . sodium chloride flush  3 mL Intravenous Q12H  . Vitamin D (Ergocalciferol)  50,000 Units Oral Q Sun  . zinc sulfate  220 mg Oral Daily   Continuous Infusions: . sodium chloride    . albumin human 12.5 g (07/31/19 1532)  . furosemide (LASIX) infusion 4 mg/hr (07/31/19 0604)     LOS: 6 days   Time spent: 40 minutes.  Lorella Nimrod, MD [facility]  If 7PM-7AM, please contact night-coverage [email or web address]  07/31/2019, 3:42 PM   This record has been created using Systems analyst. Errors have been sought and corrected,but may not always be located. Such creation errors do not reflect on the  standard of care.

## 2019-07-31 NOTE — Progress Notes (Signed)
Pt's blood glucose was 523, NP morrison was notified; New orders placed for scale change and pt will need to receive 20 units IV at this time.

## 2019-08-01 LAB — CBC
HCT: 20.8 % — ABNORMAL LOW (ref 39.0–52.0)
HCT: 22.4 % — ABNORMAL LOW (ref 39.0–52.0)
Hemoglobin: 6.8 g/dL — ABNORMAL LOW (ref 13.0–17.0)
Hemoglobin: 7 g/dL — ABNORMAL LOW (ref 13.0–17.0)
MCH: 30.8 pg (ref 26.0–34.0)
MCH: 30.6 pg (ref 26.0–34.0)
MCHC: 32.7 g/dL (ref 30.0–36.0)
MCHC: 31.3 g/dL (ref 30.0–36.0)
MCV: 94.1 fL (ref 80.0–100.0)
MCV: 97.8 fL (ref 80.0–100.0)
Platelets: 127 10*3/uL — ABNORMAL LOW (ref 150–400)
Platelets: 129 10*3/uL — ABNORMAL LOW (ref 150–400)
RBC: 2.21 MIL/uL — ABNORMAL LOW (ref 4.22–5.81)
RBC: 2.29 MIL/uL — ABNORMAL LOW (ref 4.22–5.81)
RDW: 13.8 % (ref 11.5–15.5)
RDW: 14.1 % (ref 11.5–15.5)
WBC: 15.8 10*3/uL — ABNORMAL HIGH (ref 4.0–10.5)
WBC: 16.6 10*3/uL — ABNORMAL HIGH (ref 4.0–10.5)
nRBC: 0 % (ref 0.0–0.2)
nRBC: 0 % (ref 0.0–0.2)

## 2019-08-01 LAB — RENAL FUNCTION PANEL
Albumin: 2.5 g/dL — ABNORMAL LOW (ref 3.5–5.0)
Anion gap: 8 (ref 5–15)
BUN: 112 mg/dL — ABNORMAL HIGH (ref 8–23)
CO2: 29 mmol/L (ref 22–32)
Calcium: 8.1 mg/dL — ABNORMAL LOW (ref 8.9–10.3)
Chloride: 100 mmol/L (ref 98–111)
Creatinine, Ser: 1.66 mg/dL — ABNORMAL HIGH (ref 0.61–1.24)
GFR calc Af Amer: 43 mL/min — ABNORMAL LOW (ref >60)
GFR calc non Af Amer: 37 mL/min — ABNORMAL LOW (ref >60)
Glucose, Bld: 305 mg/dL — ABNORMAL HIGH (ref 70–99)
Phosphorus: 3.1 mg/dL (ref 2.5–4.6)
Potassium: 4.3 mmol/L (ref 3.5–5.1)
Sodium: 137 mmol/L (ref 135–145)

## 2019-08-01 LAB — GLUCOSE, CAPILLARY
Glucose-Capillary: 302 mg/dL — ABNORMAL HIGH (ref 70–99)
Glucose-Capillary: 312 mg/dL — ABNORMAL HIGH (ref 70–99)
Glucose-Capillary: 291 mg/dL — ABNORMAL HIGH (ref 70–99)
Glucose-Capillary: 355 mg/dL — ABNORMAL HIGH (ref 70–99)

## 2019-08-01 LAB — PROCALCITONIN: Procalcitonin: 0.14 ng/mL

## 2019-08-01 MED ORDER — INSULIN ASPART 100 UNIT/ML ~~LOC~~ SOLN
3.0000 [IU] | Freq: Three times a day (TID) | SUBCUTANEOUS | Status: DC
  Administered 2019-08-01 – 2019-08-02 (×3): 3 [IU] via SUBCUTANEOUS
  Filled 2019-08-01 (×2): qty 1

## 2019-08-01 MED ORDER — INSULIN DETEMIR 100 UNIT/ML ~~LOC~~ SOLN
16.0000 [IU] | Freq: Two times a day (BID) | SUBCUTANEOUS | Status: DC
  Administered 2019-08-01 – 2019-08-02 (×2): 16 [IU] via SUBCUTANEOUS
  Filled 2019-08-01 (×3): qty 0.16

## 2019-08-01 MED ORDER — PANTOPRAZOLE SODIUM 40 MG IV SOLR
40.0000 mg | Freq: Two times a day (BID) | INTRAVENOUS | Status: DC
  Administered 2019-08-01 – 2019-08-10 (×18): 40 mg via INTRAVENOUS
  Filled 2019-08-01 (×18): qty 40

## 2019-08-01 NOTE — Progress Notes (Signed)
Inpatient Diabetes Program Recommendations  AACE/ADA: New Consensus Statement on Inpatient Glycemic Control (2015)  Target Ranges:  Prepandial:   less than 140 mg/dL      Peak postprandial:   less than 180 mg/dL (1-2 hours)      Critically ill patients:  140 - 180 mg/dL   Lab Results  Component Value Date   GLUCAP 312 (H) 08/01/2019   HGBA1C 7.7 (H) 06/27/2019    Review of Glycemic Control Results for TERRIS, GERMANO (MRN [id number]) as of 08/01/2019 12:04  Ref. Range 07/31/2019 12:13 07/31/2019 17:19 07/31/2019 21:43 08/01/2019 07:51 08/01/2019 11:53  Glucose-Capillary Latest Ref Range: 70 - 99 mg/dL 317 (H) 277 (H) 326 (H) 302 (H) 312 (H)   Diabetes history: DM 2 Outpatient Diabetes medications:  Novolog 0-9 units tid with meals, Levemir 32 units q HS Current orders for Inpatient glycemic control:  Levemir 10 units bid, Novolog moderate tid with meals, Novolog 2 units tid with meals   Inpatient Diabetes Program Recommendations:   If appropriate, please increase Levemir to 16 units bid. Also consider increasing Novolog meal coverage to 3 units tid with meals.    Thanks,  Adah Perl, RN, BC-ADM Inpatient Diabetes Coordinator Pager [phone] (8a-5p)

## 2019-08-01 NOTE — Progress Notes (Signed)
[address] [place], [place] [address] Phone [phone]. Fax [phone]  Date: 08/01/2019                  Patient Name:  Miguel Torres  MRN: [id number]  DOB: 06/12/1930  Age / Sex: 86 y.o., male         PCP: [name], MD                                               Subjective:  Patient not very interactive today. Dried blood noted in right ear. Renal function does appear to be improving his creatinine down to 1.4 with urine output of 3.4 L.  Current medications: Current Facility-Administered Medications  Medication Dose Route Frequency Provider Last Rate Last Admin  . 0.9 %  sodium chloride infusion  250 mL Intravenous PRN Agbata, Tochukwu, MD      . acetaminophen (TYLENOL) tablet 650 mg  650 mg Oral Q6H PRN Agbata, Tochukwu, MD      . albumin human 25 % solution 12.5 g  12.5 g Intravenous TID Murlean Iba, MD 60 mL/hr at 08/01/19 1603 12.5 g at 08/01/19 1603  . ascorbic acid (VITAMIN C) tablet 500 mg  500 mg Oral Daily Lorella Nimrod, MD   500 mg at 08/01/19 1042  . cephALEXin (KEFLEX) capsule 250 mg  250 mg Oral Q8H Lorella Nimrod, MD   250 mg at 08/01/19 1400  . chlorpheniramine-HYDROcodone (TUSSIONEX) 10-8 MG/5ML suspension 5 mL  5 mL Oral Q12H PRN Lorella Nimrod, MD      . dexamethasone (DECADRON) tablet 6 mg  6 mg Oral Daily Agbata, Tochukwu, MD   6 mg at 08/01/19 1043  . docusate sodium (COLACE) capsule 100 mg  100 mg Oral Daily Agbata, Tochukwu, MD   100 mg at 08/01/19 1043  . ferrous sulfate tablet 325 mg  325 mg Oral Daily Agbata, Tochukwu, MD   325 mg at 08/01/19 0829  . furosemide (LASIX) 250 mg in dextrose 5 % 250 mL (1 mg/mL) infusion  4 mg/hr Intravenous Continuous Murlean Iba, MD 4 mL/hr at 07/31/19 0604 4 mg/hr at 07/31/19 0604  . gluconic acid-citric acid (RENACIDIN) irrigation 30 mL  30 mL Irrigation QPM Lang Snow, NP   30 mL at 07/28/19 0107  . guaiFENesin-dextromethorphan (ROBITUSSIN DM) 100-10 MG/5ML syrup 10  mL  10 mL Oral Q4H PRN Lorella Nimrod, MD      . insulin aspart (novoLOG) injection 0-15 Units  0-15 Units Subcutaneous TID AC & HS Sharion Settler, NP   8 Units at 08/01/19 1753  . insulin aspart (novoLOG) injection 3 Units  3 Units Subcutaneous TID WC Lorella Nimrod, MD   3 Units at 08/01/19 1755  . insulin detemir (LEVEMIR) injection 16 Units  16 Units Subcutaneous BID Lorella Nimrod, MD      . Ipratropium-Albuterol (COMBIVENT) respimat 1 puff  1 puff Inhalation Q6H Lorella Nimrod, MD   1 puff at 08/01/19 1756  . loratadine (CLARITIN) tablet 10 mg  10 mg Oral Daily Agbata, Tochukwu, MD   10 mg at 08/01/19 1042  . ondansetron (ZOFRAN) injection 4 mg  4 mg Intravenous Q6H PRN Agbata, Tochukwu, MD      . pantoprazole (PROTONIX) injection 40 mg  40 mg Intravenous Q12H Amin, Soundra Pilon, MD      . pravastatin (PRAVACHOL) tablet 20 mg  20 mg Oral q1800 Agbata, Tochukwu, MD   20 mg at 07/31/19 2259  . sodium chloride flush (NS) 0.9 % injection 3 mL  3 mL Intravenous Q12H Agbata, Tochukwu, MD   3 mL at 08/01/19 1052  . sodium chloride flush (NS) 0.9 % injection 3 mL  3 mL Intravenous PRN Agbata, Tochukwu, MD      . Vitamin D (Ergocalciferol) (DRISDOL) capsule 50,000 Units  50,000 Units Oral Q Heloise Beecham, Tochukwu, MD   50,000 Units at 07/31/19 1030  . zinc sulfate capsule 220 mg  220 mg Oral Daily Lorella Nimrod, MD   220 mg at 08/01/19 1042     Vital Signs: Blood pressure 122/60, pulse (!) 115, temperature 97.6 F (36.4 C), temperature source Axillary, resp. rate 19, height 5\' 8"  (1.727 m), weight 108 kg, SpO2 100 %.   Intake/Output Summary (Last 24 hours) at 08/01/2019 1937 Last data filed at 08/01/2019 5597 Gross per 24 hour  Intake 170 ml  Output 2350 ml  Net -2180 ml    Weight trends: Filed Weights   07/27/19 0500 07/28/19 0300 08/01/19 0500  Weight: 113.8 kg 113 kg 108 kg    Physical Exam: General:  Elderly gentleman, laying in the bed  HEENT  anicteric, dry oral mucous membranes  Neck:   Supple  Lungs:  [place] O2, limited exam, normal effort  Heart::  No rub  Abdomen:  Soft, distended, BS present  Extremities:  Rt BKA, Left leg wrapped, 2+ pitting edema  Neurologic:  Lethargic, minimally interactive  Skin:  Warm, dry  Foley:  Supra pubic cathter       Lab results: Basic Metabolic Panel: Recent Labs  Lab 07/29/19 0430 07/29/19 0430 07/30/19 0740 07/31/19 0432 08/01/19 0610  NA 139   < > 138 136 137  K 4.4   < > 4.2 4.2 4.3  CL 103   < > 101 100 100  CO2 22   < > 25 27 29   GLUCOSE 174*   < > 284* 523* 305*  BUN 86*   < > 89* 93* 112*  CREATININE 2.29*   < > 2.28* 1.97* 1.66*  CALCIUM 8.4*   < > 8.1* 8.1* 8.1*  MG 2.4  --  2.4 2.1  --   PHOS 5.0*   < > 5.2* 4.1 3.1   < > = values in this interval not displayed.    Liver Function Tests: Recent Labs  Lab 07/31/19 0432 07/31/19 0432 08/01/19 0610  AST 17  --   --   ALT 17  --   --   ALKPHOS 87  --   --   BILITOT 0.5  --   --   PROT 6.2*  --   --   ALBUMIN 3.0*   < > 2.5*   < > = values in this interval not displayed.   No results for input(s): LIPASE, AMYLASE in the last 168 hours. No results for input(s): AMMONIA in the last 168 hours.  CBC: Recent Labs  Lab 07/29/19 0430 07/29/19 0430 07/30/19 0740 07/30/19 0740 08/01/19 0610 08/01/19 0733  WBC 11.3*   < > 9.4   < > 15.8* 16.6*  NEUTROABS 9.3*  --  7.6  --   --   --   HGB 9.6*   < > 9.3*   < > 6.8* 7.0*  HCT 30.5*   < > 29.5*   < > 20.8* 22.4*  MCV 97.1   < > 96.7   < >  94.1 97.8  PLT 200   < > 172   < > 127* 129*   < > = values in this interval not displayed.    Cardiac Enzymes: No results for input(s): CKTOTAL, TROPONINI in the last 168 hours.  BNP: Invalid input(s): POCBNP  CBG: Recent Labs  Lab 07/31/19 1719 07/31/19 2143 08/01/19 0751 08/01/19 1153 08/01/19 1624  GLUCAP 277* 326* 302* 312* 291*    Microbiology: Recent Results (from the past 720 hour(s))  SARS CORONAVIRUS 2 (TAT 6-24 HRS) Nasopharyngeal Nasopharyngeal  Swab     Status: Abnormal   Collection Time: 07/22/19  1:05 PM   Specimen: Nasopharyngeal Swab  Result Value Ref Range Status   SARS Coronavirus 2 POSITIVE (A) NEGATIVE Final    Comment: (NOTE) SARS-CoV-2 target nucleic acids are DETECTED. The SARS-CoV-2 RNA is generally detectable in upper and lower respiratory specimens during the acute phase of infection. Positive results are indicative of the presence of SARS-CoV-2 RNA. Clinical correlation with patient history and other diagnostic information is  necessary to determine patient infection status. Positive results do not rule out bacterial infection or co-infection with other viruses.  The expected result is Negative. Fact Sheet for Patients: SugarRoll.be Fact Sheet for Healthcare Providers: https://www.woods-mathews.com/ This test is not yet approved or cleared by the [place] FDA and  has been authorized for detection and/or diagnosis of SARS-CoV-2 by FDA under an Emergency Use Authorization (EUA). This EUA will remain  in effect (meaning this test can be used) for the duration of the COVID-19 declaration under Section 564(b)(1) of the Act, 21 U.S.C. se ction 360bbb-3(b)(1), unless the authorization is terminated or revoked sooner. Performed at [facility], [place] [address], [place], [place] [address]   Urine Culture     Status: Abnormal   Collection Time: 07/25/19  8:11 AM   Specimen: Urine, Random  Result Value Ref Range Status   Specimen Description   Final    URINE, RANDOM Performed at [facility], [address] [place], [place] [address]    Special Requests   Final    NONE Performed at [facility], [address] [place], [place] [address]    Culture (A)  Final    >=100,000 COLONIES/mL PROTEUS MIRABILIS 50,000 COLONIES/mL KLEBSIELLA PNEUMONIAE    Report Status 07/30/2019 FINAL  Final   Organism ID, Bacteria PROTEUS MIRABILIS (A)  Final    Organism ID, Bacteria KLEBSIELLA PNEUMONIAE (A)  Final      Susceptibility   Klebsiella pneumoniae - MIC*    AMPICILLIN RESISTANT Resistant     CEFAZOLIN <=4 SENSITIVE Sensitive     CEFTRIAXONE <=0.25 SENSITIVE Sensitive     CIPROFLOXACIN INTERMEDIATE Intermediate     GENTAMICIN <=1 SENSITIVE Sensitive     IMIPENEM <=0.25 SENSITIVE Sensitive     NITROFURANTOIN 128 RESISTANT Resistant     TRIMETH/SULFA <=20 SENSITIVE Sensitive     AMPICILLIN/SULBACTAM 4 SENSITIVE Sensitive     PIP/TAZO <=4 SENSITIVE Sensitive     * 50,000 COLONIES/mL KLEBSIELLA PNEUMONIAE   Proteus mirabilis - MIC*    AMPICILLIN <=2 SENSITIVE Sensitive     CEFAZOLIN <=4 SENSITIVE Sensitive     CEFTRIAXONE <=0.25 SENSITIVE Sensitive     CIPROFLOXACIN >=4 RESISTANT Resistant     GENTAMICIN <=1 SENSITIVE Sensitive     IMIPENEM 2 SENSITIVE Sensitive     NITROFURANTOIN 128 RESISTANT Resistant     TRIMETH/SULFA 40 SENSITIVE Sensitive     AMPICILLIN/SULBACTAM <=2 SENSITIVE Sensitive  PIP/TAZO <=4 SENSITIVE Sensitive     * >=100,000 COLONIES/mL PROTEUS MIRABILIS  Culture, blood (routine x 2)     Status: None   Collection Time: 07/25/19  3:49 PM   Specimen: BLOOD  Result Value Ref Range Status   Specimen Description BLOOD BLOOD RIGHT WRIST  Final   Special Requests   Final    BOTTLES DRAWN AEROBIC AND ANAEROBIC Blood Culture adequate volume   Culture   Final    NO GROWTH 5 DAYS Performed at [facility], [address] [place], [place] [address]    Report Status 07/30/2019 FINAL  Final  Culture, blood (routine x 2)     Status: None   Collection Time: 07/25/19  3:49 PM   Specimen: BLOOD  Result Value Ref Range Status   Specimen Description BLOOD BLOOD RIGHT ARM  Final   Special Requests   Final    BOTTLES DRAWN AEROBIC AND ANAEROBIC Blood Culture adequate volume   Culture   Final    NO GROWTH 5 DAYS Performed at [facility], [address] [place], [place] [address]    Report Status  07/30/2019 FINAL  Final     Coagulation Studies: No results for input(s): LABPROT, INR in the last 72 hours.  Urinalysis: No results for input(s): COLORURINE, LABSPEC, PHURINE, GLUCOSEU, HGBUR, BILIRUBINUR, KETONESUR, PROTEINUR, UROBILINOGEN, NITRITE, LEUKOCYTESUR in the last 72 hours.  Invalid input(s): APPERANCEUR      Imaging: No results found.   Assessment & Plan: Pt is a 86 y.o. caucasian  male with hypertension, atrial fibrillation, diabetes, chronic kidney disease, peripheral vascular disease, right BKA,, was admitted on 07/25/2019 with Elevated troponin [R77.8] Acute on chronic diastolic CHF (congestive heart failure) (HCC) [I50.33] COVID-19 virus infection [U07.1]    #Acute kidney injury on chronic kidney disease stage IIIb Baseline creatinine of 1.67/GFR 36 from June 21, 2019 AKI likely secondary to hypotension from concurrent infection, aggressive diuresis -Renal function continues to improve.  Creatinine down to 1.6 with urine output of 3.4 L.  Okay to maintain the patient on Lasix drip at this time.  #Generalized edema #Chronic systolic CHF, grade 1 diastolic dysfunction #Pulmonary hypertension -Continue combination of Lasix drip and albumin.  #COVID-19 virus infection  Patient remains on dexamethasone at this time.  With underlying multiple chronic medical conditions, overall prognosis appears poor.     LOS: 7 Williams Dietrick 3/29/20217:37 PM    Note: This note was prepared with Dragon dictation. Any transcription errors are unintentional

## 2019-08-01 NOTE — Progress Notes (Addendum)
PROGRESS NOTE    Miguel Torres  KNL:[id number] DOB: 1931/04/04 DOA: 07/25/2019 PCP: [name], MD   Brief Narrative:  Miguel Torres is a 86 y.o. male with medical history significant for hypertension, dyslipidemia, diabetes mellitus with complications of stage IV chronic kidney disease, history of peripheral vascular disease status post right BKA, history of atrial flutter on Eliquis and iron deficiency anemia.  He was sent to the emergency room for evaluation of shortness of breath. Patient was recently discharged from the hospital on 07/01/19 after hospitalization for CHF. He recently tested  positive for COVID 19 virus following routine testing at the SNF where he resides. Due to increased work of breathing EMS was called and he was brought to the ER. He was found to have a pulse oximetry of 90% on room air and was placed on 2L of oxygen. He has a new cough but denies having any fevers or chills.  Subjective: Pretty much remained same.  Appears lethargic and answering yes or no.  Did not eat his breakfast today.  Later in the day nursing staff noticed black tarry stools which smelled like blood.  Assessment & Plan:   Principal Problem:   Acute on chronic diastolic CHF (congestive heart failure) (HCC) Active Problems:   Anemia of chronic disease   CKD (chronic kidney disease) stage 4, GFR 15-29 ml/min (HCC)   Diabetes mellitus with nephropathy (HCC)   Atrial flutter (HCC)   PAD (peripheral artery disease) ([place])   COVID-19 virus infection   Unstageable pressure ulcer of sacral region ([place])  Acute on chronic diastolic heart failure.  Patient appears volume overloaded with anasarca.  Which seems improving. Hypoxia which improves with supplemental oxygen.  He was initially diuresed with IV Lasix and then switched to Lasix infusion for gentle diuresis by nephrology. Cardiology was consulted-they do not recommend any further work-up. -Continue diuresis with Lasix  gtt. -Monitor BMP. -Strict intake and output.-Negative 9L. -Daily weight- -Palliative care consult as patient has poor prognosis with multiple comorbidities.  They started the discussion about goals of care with his son, family need more time to decide.  Currently he will remain full scope of medical care. Discussed with son yesterday and he agreed to make him DNR with full scope of medical care.  Patient is high risk for deterioration.  COVID-19 infection.  Patient with mild hypoxia.  Chest x-ray with mild congestive heart failure no infiltrate.  Markedly elevated inflammatory markers, started improving.  Negative blood cultures.  UA looked infected. Mild hypothermia with positive leukocytosis. - urine culture-Proteus mirabilis. - PCT- 0.52>>0.39 -Completed course of remdesivir -Continue Decadron for 10 days.7/10. -Oxygen supplement as needed to keep saturation above 90%.  Anemia/leukocytosis.  Hemoglobin dropped to 6.8 with worsening leukocytosis.  Patient remained afebrile.  No obvious bleeding.  Patient is on Eliquis. As nursing staff noticed black tarry stool, discontinue aspirin and Eliquis. Check FOBT. Type and screen. IV Protonix 40 mg twice daily. -Rechecking CBC. -Transfuse if below 7.  UTI.  Urine culture with Proteus mirabilis with good sensitivity, resistant to nitrofurantoin.  He was started on ceftriaxone. -De-escalate antibiotics to Keflex for 7 days.  Elevated troponin.  Most likely secondary to demand.  Denies any chest pain. Trending down.  He was started on heparin infusion on admission and it was discontinued after 24-hour. -Continue to monitor.  Diabetes mellitus with stage 4 CKD. -Continue with SSI and sensitive mealtime coverage. -Might need increase in insulin as patient is on steroid now.  AKI with CKD stage IV.  Creatinine limited at 2.58 on admission with variation since then.  Baseline of 1.7-1.8.  Continue to improve with creatinine of 1.66 today with  worsening azotemia. -Nephrology was consulted-appreciate their recommendations. -Continue Lasix infusion. -Continue IV albumin to help with diuresis. -Continue to monitor. -Avoid nephrotoxins  History of Atrial Flutter.  Rate controlled today. -Continue low-dose metoprolol. -Continue home dose of apixaban.  Peripheral arterial disease.  S/p right BKA Patient has an ulcer on left leg.  Lower extremity ultrasound with abnormal toe brachial index. -Vascular surgery was consulted-they do not have much to offer at this time. -Wound care consult.  Objective: Vitals:   07/31/19 1700 07/31/19 2330 08/01/19 0500 08/01/19 0749  BP: (!) 126/55 (!) 145/56  131/75  Pulse: 79 (!) 48  81  Resp: 16 15  16   Temp: (!) 97.3 F (36.3 C) (!) 97.4 F (36.3 C)  (!) 97.2 F (36.2 C)  TempSrc: Axillary Axillary  Axillary  SpO2: 99% 99%  100%  Weight:   108 kg   Height:        Intake/Output Summary (Last 24 hours) at 08/01/2019 1433 Last data filed at 08/01/2019 0758 Gross per 24 hour  Intake 170 ml  Output 2750 ml  Net -2580 ml   Filed Weights   07/27/19 0500 07/28/19 0300 08/01/19 0500  Weight: 113.8 kg 113 kg 108 kg    Examination:  General exam: Chronically ill-appearing elderly man, appears lethargic but comfortable  Respiratory system: Clear to auscultation with decreased breath sounds at bases, Respiratory effort normal. Cardiovascular system: S1 & S2 heard, RRR. No JVD, murmurs, rubs, gallops or clicks. Gastrointestinal system: Soft, nontender, nondistended, bowel sounds positive. Central nervous system: Appears lethargic. No focal neurological deficits. Extremities: Right BKA, left lower extremity with Unna boot, generalized anasarca. Skin: Multiple skin lesions all over. Psychiatry: Judgement and insight appear impaired.  DVT prophylaxis: Eliquis Code Status: Full Family Communication: Son was updated on phone.   Disposition Plan: Pending improvement.  Currently patient is on  Lasix infusion.  High risk for deterioration due to multiple comorbidities.  Palliative care was consulted, ongoing discussion regarding goals of care.  Family wants some time to decide between continuation of care/comfort measures. He was made DNR with full scope of medical care.  Consultants:   Cardiology  Nephrology  Palliative care  Vascular surgery  Procedures:  Antimicrobials:  Keflex  Data Reviewed: I have personally reviewed following labs and imaging studies  CBC: Recent Labs  Lab 07/27/19 0550 07/28/19 0529 07/29/19 0430 07/30/19 0740 08/01/19 0610  WBC 10.6* 10.4 11.3* 9.4 15.8*  NEUTROABS 8.3* 8.6* 9.3* 7.6  --   HGB 9.1* 9.5* 9.6* 9.3* 6.8*  HCT 28.2* 30.6* 30.5* 29.5* 20.8*  MCV 95.3 97.5 97.1 96.7 94.1  PLT 209 202 200 172 976*   Basic Metabolic Panel: Recent Labs  Lab 07/27/19 0550 07/27/19 0550 07/28/19 0529 07/29/19 0430 07/30/19 0740 07/31/19 0432 08/01/19 0610  NA 134*   < > 136 139 138 136 137  K 4.2   < > 4.3 4.4 4.2 4.2 4.3  CL 101   < > 101 103 101 100 100  CO2 24   < > 23 22 25 27 29   GLUCOSE 250*   < > 170* 174* 284* 523* 305*  BUN 75*   < > 80* 86* 89* 93* 112*  CREATININE 2.38*   < > 2.44* 2.29* 2.28* 1.97* 1.66*  CALCIUM 7.9*   < > 8.2*  8.4* 8.1* 8.1* 8.1*  MG 2.3  --  2.4 2.4 2.4 2.1  --   PHOS 5.3*   < > 5.7* 5.0* 5.2* 4.1 3.1   < > = values in this interval not displayed.   GFR: Estimated Creatinine Clearance: 38 mL/min (A) (by C-G formula based on SCr of 1.66 mg/dL (H)). Liver Function Tests: Recent Labs  Lab 07/27/19 0550 07/27/19 0550 07/28/19 0529 07/29/19 0430 07/30/19 0740 07/31/19 0432 08/01/19 0610  AST 35  --  24 21 18 17   --   ALT 28  --  23 19 16 17   --   ALKPHOS 147*  --  123 113 93 87  --   BILITOT 0.6  --  1.0 1.0 0.7 0.5  --   PROT 6.4*  --  6.5 6.4* 6.1* 6.2*  --   ALBUMIN 2.3*   < > 2.7* 2.9* 2.9* 3.0* 2.5*   < > = values in this interval not displayed.   No results for input(s): LIPASE,  AMYLASE in the last 168 hours. No results for input(s): AMMONIA in the last 168 hours. Coagulation Profile: Recent Labs  Lab 07/25/19 1707  INR 2.2*   Cardiac Enzymes: No results for input(s): CKTOTAL, CKMB, CKMBINDEX, TROPONINI in the last 168 hours. BNP (last 3 results) No results for input(s): PROBNP in the last 8760 hours. HbA1C: No results for input(s): HGBA1C in the last 72 hours. CBG: Recent Labs  Lab 07/31/19 1213 07/31/19 1719 07/31/19 2143 08/01/19 0751 08/01/19 1153  GLUCAP 317* 277* 326* 302* 312*   Lipid Profile: No results for input(s): CHOL, HDL, LDLCALC, TRIG, CHOLHDL, LDLDIRECT in the last 72 hours. Thyroid Function Tests: No results for input(s): TSH, T4TOTAL, FREET4, T3FREE, THYROIDAB in the last 72 hours. Anemia Panel: No results for input(s): VITAMINB12, FOLATE, FERRITIN, TIBC, IRON, RETICCTPCT in the last 72 hours. Sepsis Labs: Recent Labs  Lab 07/26/19 1622 07/27/19 0550 07/28/19 0529 08/01/19 0610  PROCALCITON 0.55 0.52 0.39 0.14    Recent Results (from the past 240 hour(s))  Urine Culture     Status: Abnormal   Collection Time: 07/25/19  8:11 AM   Specimen: Urine, Random  Result Value Ref Range Status   Specimen Description   Final    URINE, RANDOM Performed at [facility], [address] [place], [place] [address]    Special Requests   Final    NONE Performed at [facility], [address] [place], [place] [address]    Culture (A)  Final    >=100,000 COLONIES/mL PROTEUS MIRABILIS 50,000 COLONIES/mL KLEBSIELLA PNEUMONIAE    Report Status 07/30/2019 FINAL  Final   Organism ID, Bacteria PROTEUS MIRABILIS (A)  Final   Organism ID, Bacteria KLEBSIELLA PNEUMONIAE (A)  Final      Susceptibility   Klebsiella pneumoniae - MIC*    AMPICILLIN RESISTANT Resistant     CEFAZOLIN <=4 SENSITIVE Sensitive     CEFTRIAXONE <=0.25 SENSITIVE Sensitive     CIPROFLOXACIN INTERMEDIATE Intermediate     GENTAMICIN <=1 SENSITIVE  Sensitive     IMIPENEM <=0.25 SENSITIVE Sensitive     NITROFURANTOIN 128 RESISTANT Resistant     TRIMETH/SULFA <=20 SENSITIVE Sensitive     AMPICILLIN/SULBACTAM 4 SENSITIVE Sensitive     PIP/TAZO <=4 SENSITIVE Sensitive     * 50,000 COLONIES/mL KLEBSIELLA PNEUMONIAE   Proteus mirabilis - MIC*    AMPICILLIN <=2 SENSITIVE Sensitive     CEFAZOLIN <=4 SENSITIVE Sensitive     CEFTRIAXONE <=0.25 SENSITIVE  Sensitive     CIPROFLOXACIN >=4 RESISTANT Resistant     GENTAMICIN <=1 SENSITIVE Sensitive     IMIPENEM 2 SENSITIVE Sensitive     NITROFURANTOIN 128 RESISTANT Resistant     TRIMETH/SULFA 40 SENSITIVE Sensitive     AMPICILLIN/SULBACTAM <=2 SENSITIVE Sensitive     PIP/TAZO <=4 SENSITIVE Sensitive     * >=100,000 COLONIES/mL PROTEUS MIRABILIS  Culture, blood (routine x 2)     Status: None   Collection Time: 07/25/19  3:49 PM   Specimen: BLOOD  Result Value Ref Range Status   Specimen Description BLOOD BLOOD RIGHT WRIST  Final   Special Requests   Final    BOTTLES DRAWN AEROBIC AND ANAEROBIC Blood Culture adequate volume   Culture   Final    NO GROWTH 5 DAYS Performed at [facility], [address], [place], [place] [address]    Report Status 07/30/2019 FINAL  Final  Culture, blood (routine x 2)     Status: None   Collection Time: 07/25/19  3:49 PM   Specimen: BLOOD  Result Value Ref Range Status   Specimen Description BLOOD BLOOD RIGHT ARM  Final   Special Requests   Final    BOTTLES DRAWN AEROBIC AND ANAEROBIC Blood Culture adequate volume   Culture   Final    NO GROWTH 5 DAYS Performed at [facility], [address], [place], [place] [address]    Report Status 07/30/2019 FINAL  Final     Radiology Studies: No results found.  Scheduled Meds: . vitamin C  500 mg Oral Daily  . aspirin EC  81 mg Oral Daily  . cephALEXin  250 mg Oral Q8H  . dexamethasone  6 mg Oral Daily  . docusate sodium  100 mg Oral Daily  . ferrous sulfate  325 mg Oral Daily  .  gluconic acid-citric acid  30 mL Irrigation QPM  . insulin aspart  0-15 Units Subcutaneous TID AC & HS  . insulin aspart  3 Units Subcutaneous TID WC  . insulin detemir  16 Units Subcutaneous BID  . Ipratropium-Albuterol  1 puff Inhalation Q6H  . loratadine  10 mg Oral Daily  . pravastatin  20 mg Oral q1800  . sodium chloride flush  3 mL Intravenous Q12H  . Vitamin D (Ergocalciferol)  50,000 Units Oral Q Sun  . zinc sulfate  220 mg Oral Daily   Continuous Infusions: . sodium chloride    . albumin human 12.5 g (08/01/19 1041)  . furosemide (LASIX) infusion 4 mg/hr (07/31/19 0604)     LOS: 7 days   Time spent: 40 minutes.  Lorella Nimrod, MD [facility]  If 7PM-7AM, please contact night-coverage [email or web address]  08/01/2019, 2:33 PM   This record has been created using Systems analyst. Errors have been sought and corrected,but may not always be located. Such creation errors do not reflect on the standard of care.

## 2019-08-01 NOTE — Progress Notes (Addendum)
Daily Progress Note   Patient Name: Miguel Torres       Date: 08/01/2019 DOB: 10-04-1930  Age: 86 y.o. MRN#: [id number] Attending Physician: Lorella Nimrod, MD Primary Care Physician: [name], MD Admit Date: 07/25/2019  Reason for Consultation/Follow-up: Establishing goals of care  Subjective: Patient is on covid isolation. He does not answer the phone. Spoke with son Ron. He states it sounds like in some regards his father is improving such as responsiveness, but in other areas he is not. He states he and his family want to make sure he has every reasonable opportunity before making the decision to transition to comfort. He states he wants to know they did all they could to treat the treatable.    He states his father would not want to have a feeding tube.   Recommend calorie count.  Recommend redrawing CBC and treating as indicated. Recommend chest xray 2/2 leukocytosis. Concern for aspiration PNA vs atelectasis from sitting.   Per nursing he is only taking bites and sips of ice cream and apple sauce.   Length of Stay: 7  Current Medications: Scheduled Meds:  . vitamin C  500 mg Oral Daily  . aspirin EC  81 mg Oral Daily  . cephALEXin  250 mg Oral Q8H  . dexamethasone  6 mg Oral Daily  . docusate sodium  100 mg Oral Daily  . ferrous sulfate  325 mg Oral Daily  . gluconic acid-citric acid  30 mL Irrigation QPM  . insulin aspart  0-15 Units Subcutaneous TID AC & HS  . insulin aspart  3 Units Subcutaneous TID WC  . insulin detemir  16 Units Subcutaneous BID  . Ipratropium-Albuterol  1 puff Inhalation Q6H  . loratadine  10 mg Oral Daily  . pravastatin  20 mg Oral q1800  . sodium chloride flush  3 mL Intravenous Q12H  . Vitamin D (Ergocalciferol)  50,000 Units Oral Q Sun   . zinc sulfate  220 mg Oral Daily    Continuous Infusions: . sodium chloride    . albumin human 12.5 g (08/01/19 1041)  . furosemide (LASIX) infusion 4 mg/hr (07/31/19 0604)    PRN Meds: sodium chloride, acetaminophen, chlorpheniramine-HYDROcodone, guaiFENesin-dextromethorphan, ondansetron (ZOFRAN) IV, sodium chloride flush    Vital Signs: BP 131/75 (BP Location: Right Arm)  Pulse 81   Temp (!) 97.2 F (36.2 C) (Axillary)   Resp 16   Ht 5\' 8"  (1.727 m)   Wt 108 kg   SpO2 100%   BMI 36.20 kg/m  SpO2: SpO2: 100 % O2 Device: O2 Device: Nasal Cannula O2 Flow Rate: O2 Flow Rate (L/min): 4 L/min  Intake/output summary:   Intake/Output Summary (Last 24 hours) at 08/01/2019 1447 Last data filed at 08/01/2019 7062 Gross per 24 hour  Intake 170 ml  Output 2750 ml  Net -2580 ml   LBM: Last BM Date: 07/29/19 Baseline Weight: Weight: 98.9 kg Most recent weight: Weight: 108 kg       Palliative Assessment/Data:    Flowsheet Rows     Most Recent Value  Intake Tab  Referral Department  Hospitalist  Unit at Time of Referral  Med/Surg Unit  Palliative Care Primary Diagnosis  Cardiac  Date Notified  07/26/19  Palliative Care Type  Return patient Palliative Care  Reason for referral  Clarify Goals of Care  Date of Admission  07/25/19  Date first seen by Palliative Care  07/27/19  # of days Palliative referral response time  1 Day(s)  # of days IP prior to Palliative referral  1  Clinical Assessment  Psychosocial & Spiritual Assessment  Palliative Care Outcomes      Patient Active Problem List   Diagnosis Date Noted  . Unstageable pressure ulcer of sacral region ([place]) 07/28/2019  . COVID-19 virus infection 07/25/2019  . Acute on chronic diastolic CHF (congestive heart failure) ([place]) 06/21/2019  . HTN (hypertension) 06/21/2019  . Type II diabetes mellitus with renal manifestations ([place]) 06/21/2019  . Iron deficiency anemia 06/21/2019  . PAD (peripheral artery  disease) ([place])   . Venous ulcer of left lower extremity without varicose veins ([place]) 06/13/2019  . S/P BKA (below knee amputation) unilateral, right ([place]) 05/18/2019  . Atherosclerosis of native arteries of the extremities with ulceration ([place]) 03/14/2019  . Resides in skilled nursing facility 02/24/2018  . Benign prostatic hyperplasia with urinary obstruction 12/23/2017  . Urinary retention 12/18/2017  . Acute diastolic (congestive) heart failure ([place]) 12/17/2017  . Goals of care, counseling/discussion   . Palliative care encounter   . Congestive heart failure (CHF) ([place]) 12/16/2017  . Acute diastolic CHF (congestive heart failure) ([place]) 12/16/2017  . Acute kidney injury ([place]) 12/07/2017  . Foot ulceration, left, with fat layer exposed ([place]) 11/23/2017  . Lymphedema 11/23/2017  . Shoulder pain, right 11/04/2017  . Pressure injury of skin 11/02/2017  . Elevated troponin 11/01/2017  . Microalbuminuria 10/15/2016  . BCC (basal cell carcinoma), face 06/24/2016  . Vitamin D deficiency 07/12/2015  . Retinopathy, diabetic, proliferative ([place]) 07/02/2015  . Bleeding duodenal ulcer 02/02/2015  . Atrial flutter ([place]) 01/19/2015  . Allergic rhinitis 01/17/2015  . Anemia of chronic disease 01/17/2015  . CKD (chronic kidney disease) stage 4, GFR 15-29 ml/min (HCC) 01/17/2015  . Diabetes mellitus with nephropathy ([place]) 01/17/2015  . Edema 01/17/2015  . Gout 01/17/2015  . Amputation of right lower extremity below knee upon examination ([place]) 01/17/2015  . HLD (hyperlipidemia) 01/17/2015  . Psoriasis 01/17/2015  . CA of skin 01/17/2015  . Acquired absence of right leg below knee ([place]) 01/17/2015  . Hypertension 10/27/2014  . Mild cognitive disorder 12/05/2013  . Postop check 02/10/2013  . Cancer of skin, squamous cell 01/21/2013  . Squamous cell carcinoma of scalp and skin of neck 03/03/2011    Palliative Care Assessment & Plan  Recommendations/Plan: Son states family is realistic,  but would like to see how he does.    Recommend calorie count.  Recommend redrawing CBC and treating as indicated. Recommend chest xray 2/2 leukocytosis. Concern for aspiration PNA vs atelectasis from sitting.    Code Status:    Code Status Orders  (From admission, onward)         Start     Ordered   07/27/19 1420  Limited resuscitation (code)  Continuous    Question Answer Comment  In the event of cardiac or respiratory ARREST: Initiate Code Blue, Call Rapid Response Yes   In the event of cardiac or respiratory ARREST: Perform CPR Yes   In the event of cardiac or respiratory ARREST: Perform Intubation/Mechanical Ventilation Yes   In the event of cardiac or respiratory ARREST: Use NIPPV/BiPAp only if indicated Yes   In the event of cardiac or respiratory ARREST: Administer ACLS medications if indicated Yes   In the event of cardiac or respiratory ARREST: Perform Defibrillation or Cardioversion if indicated Yes   Comments If patient is pulseless and apneic, no intervention. If not pulseless and apneic, do ACLS.      07/27/19 1420        Code Status History    Date Active Date Inactive Code Status Order ID Comments User Context   07/25/2019 1558 07/27/2019 1420 Full Code [id number]  Collier Bullock, MD ED   06/21/2019 1837 07/01/2019 1759 DNR [id number]  Ivor Costa, MD Inpatient   12/16/2017 1745 12/21/2017 1956 DNR [id number]  Vaughan Basta, MD Inpatient   12/16/2017 1647 12/16/2017 1745 DNR [id number]  Vaughan Basta, MD ED   12/07/2017 1424 12/09/2017 1613 Full Code [id number]  Salary, Avel Peace, MD Inpatient   11/01/2017 1358 11/04/2017 1719 Full Code [id number]  Gladstone Lighter, MD Inpatient   Advance Care Planning Activity       Prognosis:  Poor overall    Thank you for allowing the Palliative Medicine Team to assist in the care of this patient.  COVID-19 DISASTER DECLARATION:   PHYSICAL EXAMINATION WAS NOT POSSIBLE DUE TO TREATMENT OF COVID-19   AND CONSERVATION OF PERSONAL PROTECTIVE EQUIPMENT  Patient assessed or the symptoms described in the history of present illness.  In the context of the Global COVID-19 pandemic, which necessitated consideration that the patient might be at risk for infection with the SARS-CoV-2 virus that causes COVID-19, Institutional protocols and algorithms that pertain to the evaluation of patients at risk for COVID-19 are in a state of rapid change based on information released by regulatory bodies including the CDC and federal and state organizations. These policies and algorithms were followed during the patient's care while in hospital.  Total Time 35 min Prolonged Time Billed no      Greater than 50%  of this time was spent counseling and coordinating care related to the above assessment and plan.  Asencion Gowda, NP  Please contact Palliative Medicine Team phone at [phone] for questions and concerns.

## 2019-08-01 NOTE — Progress Notes (Signed)
Provider notified that pt had dark tarry stool. Orders received for testing for fecal occult blood and and to type and cross.

## 2019-08-01 NOTE — Plan of Care (Signed)
Needs reinforcement with education. Confused but able to be reoriented

## 2019-08-02 DIAGNOSIS — K921 Melena: Secondary | ICD-10-CM

## 2019-08-02 DIAGNOSIS — K922 Gastrointestinal hemorrhage, unspecified: Secondary | ICD-10-CM

## 2019-08-02 LAB — GLUCOSE, CAPILLARY
Glucose-Capillary: 333 mg/dL — ABNORMAL HIGH (ref 70–99)
Glucose-Capillary: 430 mg/dL — ABNORMAL HIGH (ref 70–99)
Glucose-Capillary: 415 mg/dL — ABNORMAL HIGH (ref 70–99)
Glucose-Capillary: 419 mg/dL — ABNORMAL HIGH (ref 70–99)

## 2019-08-02 LAB — CBC
HCT: 19.3 % — ABNORMAL LOW (ref 39.0–52.0)
Hemoglobin: 6.3 g/dL — ABNORMAL LOW (ref 13.0–17.0)
MCH: 30.6 pg (ref 26.0–34.0)
MCHC: 32.6 g/dL (ref 30.0–36.0)
MCV: 93.7 fL (ref 80.0–100.0)
Platelets: 118 10*3/uL — ABNORMAL LOW (ref 150–400)
RBC: 2.06 MIL/uL — ABNORMAL LOW (ref 4.22–5.81)
RDW: 14.2 % (ref 11.5–15.5)
WBC: 15.2 10*3/uL — ABNORMAL HIGH (ref 4.0–10.5)
nRBC: 0.1 % (ref 0.0–0.2)

## 2019-08-02 LAB — BASIC METABOLIC PANEL
Anion gap: 12 (ref 5–15)
BUN: 122 mg/dL — ABNORMAL HIGH (ref 8–23)
CO2: 28 mmol/L (ref 22–32)
Calcium: 8.1 mg/dL — ABNORMAL LOW (ref 8.9–10.3)
Chloride: 93 mmol/L — ABNORMAL LOW (ref 98–111)
Creatinine, Ser: 1.86 mg/dL — ABNORMAL HIGH (ref 0.61–1.24)
GFR calc Af Amer: 37 mL/min — ABNORMAL LOW (ref >60)
GFR calc non Af Amer: 32 mL/min — ABNORMAL LOW (ref >60)
Glucose, Bld: 378 mg/dL — ABNORMAL HIGH (ref 70–99)
Potassium: 4.2 mmol/L (ref 3.5–5.1)
Sodium: 133 mmol/L — ABNORMAL LOW (ref 135–145)

## 2019-08-02 LAB — PROCALCITONIN: Procalcitonin: 0.15 ng/mL

## 2019-08-02 LAB — PREPARE RBC (CROSSMATCH)

## 2019-08-02 MED ORDER — INSULIN DETEMIR 100 UNIT/ML ~~LOC~~ SOLN
20.0000 [IU] | Freq: Two times a day (BID) | SUBCUTANEOUS | Status: DC
  Administered 2019-08-02 – 2019-08-06 (×8): 20 [IU] via SUBCUTANEOUS
  Filled 2019-08-02 (×11): qty 0.2

## 2019-08-02 MED ORDER — INSULIN ASPART 100 UNIT/ML ~~LOC~~ SOLN: 20.0000 [IU] | Freq: Once | SUBCUTANEOUS | Status: AC

## 2019-08-02 MED ORDER — SODIUM CHLORIDE 0.9% IV SOLUTION: Freq: Once | INTRAVENOUS | Status: AC

## 2019-08-02 MED ORDER — INSULIN ASPART 100 UNIT/ML ~~LOC~~ SOLN: 5.0000 [IU] | Freq: Once | SUBCUTANEOUS | Status: DC

## 2019-08-02 MED ORDER — INSULIN ASPART 100 UNIT/ML ~~LOC~~ SOLN
6.0000 [IU] | Freq: Three times a day (TID) | SUBCUTANEOUS | Status: DC
  Administered 2019-08-02 – 2019-08-03 (×2): 6 [IU] via SUBCUTANEOUS
  Filled 2019-08-02 (×2): qty 1

## 2019-08-02 NOTE — Progress Notes (Signed)
PROGRESS NOTE    Miguel Torres  ZJI:[id number] DOB: [birth year] DOA: 07/25/2019 PCP: [name], MD   Brief Narrative:  Miguel Torres is a 86 y.o. male with medical history significant for hypertension, dyslipidemia, diabetes mellitus with complications of stage IV chronic kidney disease, history of peripheral vascular disease status post right BKA, history of atrial flutter on Eliquis and iron deficiency anemia.  He was sent to the emergency room for evaluation of shortness of breath. Patient was recently discharged from the hospital on 07/01/19 after hospitalization for CHF. He recently tested  positive for COVID 19 virus following routine testing at the SNF where he resides. Due to increased work of breathing EMS was called and he was brought to the ER. He was found to have a pulse oximetry of 90% on room air and was placed on 2L of oxygen. He has a new cough but denies having any fevers or chills.  Subjective: Pretty much remained same.  Appears lethargic and answering yes or no.    Assessment & Plan:   Principal Problem:   Acute on chronic diastolic CHF (congestive heart failure) (HCC) Active Problems:   Anemia of chronic disease   CKD (chronic kidney disease) stage 4, GFR 15-29 ml/min (HCC)   Diabetes mellitus with nephropathy (HCC)   Atrial flutter (HCC)   PAD (peripheral artery disease) ([place])   COVID-19 virus infection   Unstageable pressure ulcer of sacral region ([place])  Acute on chronic diastolic heart failure.  Patient appears volume overloaded with anasarca.  Which seems improving. Hypoxia which improves with supplemental oxygen.  He was initially diuresed with IV Lasix and then switched to Lasix infusion for gentle diuresis by nephrology. Cardiology was consulted-they do not recommend any further work-up. -Continue diuresis with Lasix gtt. -Monitor BMP. -Strict intake and output.-Negative 11L. -Daily weight- -Palliative care consult as patient has poor  prognosis with multiple comorbidities.  They started the discussion about goals of care with his son, family need more time to decide.  Currently he will remain full scope of medical care. Discussed with son  and he agreed to make him DNR with full scope of medical care.  Patient is high risk for deterioration.  COVID-19 infection.  Patient with mild hypoxia.  Chest x-ray with mild congestive heart failure no infiltrate.  Markedly elevated inflammatory markers, started improving.  Negative blood cultures.  UA looked infected. Mild hypothermia with positive leukocytosis. - urine culture-Proteus mirabilis and Klebsiella pneumonia. - PCT- 0.52>>0.39>>0.15 -Completed course of remdesivir -Continue Decadron for 10 days.7/10. -Oxygen supplement as needed to keep saturation above 90%.  Anemia/leukocytosis.  Hemoglobin dropped to 6.3 with leukocytosis.  Patient remained afebrile.  As nursing staff noticed black tarry stool, discontinue aspirin and Eliquis. Check FOBT. Type and screen-give him 1 unit of PRBC. IV Protonix 40 mg twice daily. -Monitor CBC. -Transfuse if below 7. -GI consult-we will appreciate their recommendations.  UTI.  Urine culture with Proteus mirabilis and Klebsiella pneumonia with good sensitivity, resistant to nitrofurantoin.  He was started on ceftriaxone. -De-escalate antibiotics to Keflex for 7 days.  Elevated troponin.  Most likely secondary to demand.  Denies any chest pain. Trending down.  He was started on heparin infusion on admission and it was discontinued after 24-hour. -Continue to monitor.  Diabetes mellitus with stage 4 CKD. -Continue with SSI and  mealtime coverage. -Might need increase in insulin as patient is on steroid now.  AKI with CKD stage IV.  Creatinine at 2.58 on admission with  variation since then.  Baseline of 1.7-1.8.  Continue to improve with creatinine of 1.86 today with worsening azotemia. -Nephrology was consulted-appreciate their  recommendations. -Continue Lasix infusion. -Continue IV albumin to help with diuresis. -Continue to monitor. -Avoid nephrotoxins  History of Atrial Flutter.  Rate controlled today. -Continue low-dose metoprolol. -Discontinue Eliquis due to GI bleed.  Peripheral arterial disease.  S/p right BKA Patient has an ulcer on left leg.  Lower extremity ultrasound with abnormal toe brachial index. -Vascular surgery was consulted-they do not have much to offer at this time. -Wound care consult.  Objective: Vitals:   08/01/19 1623 08/01/19 2035 08/02/19 0038 08/02/19 0754  BP: 122/60 120/66 138/80 (!) 150/75  Pulse: (!) 115  90   Resp: 19 20 17    Temp: 97.6 F (36.4 C) 98.3 F (36.8 C) 98.3 F (36.8 C) 97.6 F (36.4 C)  TempSrc: Axillary Oral  Axillary  SpO2: 100%  100%   Weight:      Height:        Intake/Output Summary (Last 24 hours) at 08/02/2019 1340 Last data filed at 08/02/2019 0800 Gross per 24 hour  Intake 500 ml  Output 2900 ml  Net -2400 ml   Filed Weights   07/27/19 0500 07/28/19 0300 08/01/19 0500  Weight: 113.8 kg 113 kg 108 kg    Examination:  General exam: Chronically ill-appearing elderly man, appears lethargic but comfortable  Respiratory system: Clear to auscultation with decreased breath sounds at bases, Respiratory effort normal. Cardiovascular system: S1 & S2 heard, RRR. No JVD, murmurs, rubs, gallops or clicks. Gastrointestinal system: Soft, nontender, nondistended, bowel sounds positive. Central nervous system: Appears lethargic. No focal neurological deficits. Extremities: Right BKA, left lower extremity with Unna boot, generalized anasarca. Skin: Multiple skin lesions all over. Psychiatry: Judgement and insight appear impaired.  DVT prophylaxis: SCd-discontinuing Eliquis due to GI bleed. Code Status: DNR Family Communication: Son was updated on phone.   Disposition Plan: Pending improvement.  Currently patient is on Lasix infusion.  High risk for  deterioration due to multiple comorbidities.  Palliative care was consulted, ongoing discussion regarding goals of care.  Family wants some time to decide between continuation of care/comfort measures. He was made DNR with full scope of medical care.  Consultants:   Cardiology  Nephrology  Palliative care  Vascular surgery  GI  Procedures:  Antimicrobials:  Keflex  Data Reviewed: I have personally reviewed following labs and imaging studies  CBC: Recent Labs  Lab 07/27/19 0550 07/27/19 0550 07/28/19 0529 07/28/19 0529 07/29/19 0430 07/30/19 0740 08/01/19 0610 08/01/19 0733 08/02/19 0548  WBC 10.6*   < > 10.4   < > 11.3* 9.4 15.8* 16.6* 15.2*  NEUTROABS 8.3*  --  8.6*  --  9.3* 7.6  --   --   --   HGB 9.1*   < > 9.5*   < > 9.6* 9.3* 6.8* 7.0* 6.3*  HCT 28.2*   < > 30.6*   < > 30.5* 29.5* 20.8* 22.4* 19.3*  MCV 95.3   < > 97.5   < > 97.1 96.7 94.1 97.8 93.7  PLT 209   < > 202   < > 200 172 127* 129* 118*   < > = values in this interval not displayed.   Basic Metabolic Panel: Recent Labs  Lab 07/27/19 0550 07/27/19 0550 07/28/19 0529 07/28/19 0529 07/29/19 0430 07/30/19 0740 07/31/19 0432 08/01/19 0610 08/02/19 0548  NA 134*   < > 136   < > 139 138  136 137 133*  K 4.2   < > 4.3   < > 4.4 4.2 4.2 4.3 4.2  CL 101   < > 101   < > 103 101 100 100 93*  CO2 24   < > 23   < > 22 25 27 29 28   GLUCOSE 250*   < > 170*   < > 174* 284* 523* 305* 378*  BUN 75*   < > 80*   < > 86* 89* 93* 112* 122*  CREATININE 2.38*   < > 2.44*   < > 2.29* 2.28* 1.97* 1.66* 1.86*  CALCIUM 7.9*   < > 8.2*   < > 8.4* 8.1* 8.1* 8.1* 8.1*  MG 2.3  --  2.4  --  2.4 2.4 2.1  --   --   PHOS 5.3*   < > 5.7*  --  5.0* 5.2* 4.1 3.1  --    < > = values in this interval not displayed.   GFR: Estimated Creatinine Clearance: 34 mL/min (A) (by C-G formula based on SCr of 1.86 mg/dL (H)). Liver Function Tests: Recent Labs  Lab 07/27/19 0550 07/27/19 0550 07/28/19 0529 07/29/19 0430  07/30/19 0740 07/31/19 0432 08/01/19 0610  AST 35  --  24 21 18 17   --   ALT 28  --  23 19 16 17   --   ALKPHOS 147*  --  123 113 93 87  --   BILITOT 0.6  --  1.0 1.0 0.7 0.5  --   PROT 6.4*  --  6.5 6.4* 6.1* 6.2*  --   ALBUMIN 2.3*   < > 2.7* 2.9* 2.9* 3.0* 2.5*   < > = values in this interval not displayed.   No results for input(s): LIPASE, AMYLASE in the last 168 hours. No results for input(s): AMMONIA in the last 168 hours. Coagulation Profile: No results for input(s): INR, PROTIME in the last 168 hours. Cardiac Enzymes: No results for input(s): CKTOTAL, CKMB, CKMBINDEX, TROPONINI in the last 168 hours. BNP (last 3 results) No results for input(s): PROBNP in the last 8760 hours. HbA1C: No results for input(s): HGBA1C in the last 72 hours. CBG: Recent Labs  Lab 08/01/19 1153 08/01/19 1624 08/01/19 2128 08/02/19 0804 08/02/19 1143  GLUCAP 312* 291* 355* 333* 430*   Lipid Profile: No results for input(s): CHOL, HDL, LDLCALC, TRIG, CHOLHDL, LDLDIRECT in the last 72 hours. Thyroid Function Tests: No results for input(s): TSH, T4TOTAL, FREET4, T3FREE, THYROIDAB in the last 72 hours. Anemia Panel: No results for input(s): VITAMINB12, FOLATE, FERRITIN, TIBC, IRON, RETICCTPCT in the last 72 hours. Sepsis Labs: Recent Labs  Lab 07/27/19 0550 07/28/19 0529 08/01/19 0610 08/02/19 0548  PROCALCITON 0.52 0.39 0.14 0.15    Recent Results (from the past 240 hour(s))  Urine Culture     Status: Abnormal   Collection Time: 07/25/19  8:11 AM   Specimen: Urine, Random  Result Value Ref Range Status   Specimen Description   Final    URINE, RANDOM Performed at [facility], [address], [place], [place] [address]    Special Requests   Final    NONE Performed at [facility], [address] [place], [place] [address]    Culture (A)  Final    >=100,000 COLONIES/mL PROTEUS MIRABILIS 50,000 COLONIES/mL KLEBSIELLA PNEUMONIAE    Report Status  07/30/2019 FINAL  Final   Organism ID, Bacteria PROTEUS MIRABILIS (A)  Final   Organism ID, Bacteria KLEBSIELLA  PNEUMONIAE (A)  Final      Susceptibility   Klebsiella pneumoniae - MIC*    AMPICILLIN RESISTANT Resistant     CEFAZOLIN <=4 SENSITIVE Sensitive     CEFTRIAXONE <=0.25 SENSITIVE Sensitive     CIPROFLOXACIN INTERMEDIATE Intermediate     GENTAMICIN <=1 SENSITIVE Sensitive     IMIPENEM <=0.25 SENSITIVE Sensitive     NITROFURANTOIN 128 RESISTANT Resistant     TRIMETH/SULFA <=20 SENSITIVE Sensitive     AMPICILLIN/SULBACTAM 4 SENSITIVE Sensitive     PIP/TAZO <=4 SENSITIVE Sensitive     * 50,000 COLONIES/mL KLEBSIELLA PNEUMONIAE   Proteus mirabilis - MIC*    AMPICILLIN <=2 SENSITIVE Sensitive     CEFAZOLIN <=4 SENSITIVE Sensitive     CEFTRIAXONE <=0.25 SENSITIVE Sensitive     CIPROFLOXACIN >=4 RESISTANT Resistant     GENTAMICIN <=1 SENSITIVE Sensitive     IMIPENEM 2 SENSITIVE Sensitive     NITROFURANTOIN 128 RESISTANT Resistant     TRIMETH/SULFA 40 SENSITIVE Sensitive     AMPICILLIN/SULBACTAM <=2 SENSITIVE Sensitive     PIP/TAZO <=4 SENSITIVE Sensitive     * >=100,000 COLONIES/mL PROTEUS MIRABILIS  Culture, blood (routine x 2)     Status: None   Collection Time: 07/25/19  3:49 PM   Specimen: BLOOD  Result Value Ref Range Status   Specimen Description BLOOD BLOOD RIGHT WRIST  Final   Special Requests   Final    BOTTLES DRAWN AEROBIC AND ANAEROBIC Blood Culture adequate volume   Culture   Final    NO GROWTH 5 DAYS Performed at [facility], [address] [place], [place] [address]    Report Status 07/30/2019 FINAL  Final  Culture, blood (routine x 2)     Status: None   Collection Time: 07/25/19  3:49 PM   Specimen: BLOOD  Result Value Ref Range Status   Specimen Description BLOOD BLOOD RIGHT ARM  Final   Special Requests   Final    BOTTLES DRAWN AEROBIC AND ANAEROBIC Blood Culture adequate volume   Culture   Final    NO GROWTH 5 DAYS Performed at [facility], [address], [place], [place] [address]    Report Status 07/30/2019 FINAL  Final     Radiology Studies: No results found.  Scheduled Meds: . sodium chloride   Intravenous Once  . vitamin C  500 mg Oral Daily  . cephALEXin  250 mg Oral Q8H  . dexamethasone  6 mg Oral Daily  . docusate sodium  100 mg Oral Daily  . ferrous sulfate  325 mg Oral Daily  . gluconic acid-citric acid  30 mL Irrigation QPM  . insulin aspart  0-15 Units Subcutaneous TID AC & HS  . insulin aspart  3 Units Subcutaneous TID WC  . insulin detemir  16 Units Subcutaneous BID  . Ipratropium-Albuterol  1 puff Inhalation Q6H  . loratadine  10 mg Oral Daily  . pantoprazole (PROTONIX) IV  40 mg Intravenous Q12H  . pravastatin  20 mg Oral q1800  . sodium chloride flush  3 mL Intravenous Q12H  . Vitamin D (Ergocalciferol)  50,000 Units Oral Q Sun  . zinc sulfate  220 mg Oral Daily   Continuous Infusions: . sodium chloride    . albumin human 12.5 g (08/02/19 1113)  . furosemide (LASIX) infusion 4 mg/hr (08/02/19 0500)     LOS: 8 days   Time spent: 40 minutes.  Lorella Nimrod, MD [facility]  If 7PM-7AM, please contact  night-coverage [email or web address]  08/02/2019, 1:40 PM   This record has been created using Systems analyst. Errors have been sought and corrected,but may not always be located. Such creation errors do not reflect on the standard of care.

## 2019-08-02 NOTE — Progress Notes (Signed)
Pt's evening finger stick glucose is 415. 3 attempts made to contact MD, Nurse Manager and Admin Coordinator made aware. This nurse covered patients blood sugar with maximum sliding scale dose of 15 units plus scheduled 6 units given.

## 2019-08-02 NOTE — Progress Notes (Signed)
[address] [place], [place] [address] Phone [phone]. Fax [phone]  Date: 08/02/2019                  Patient Name:  Miguel Torres  MRN: [id number]  DOB: 07/26/1930  Age / Sex: 86 y.o., male         PCP: [name], MD                                               Subjective:  Patient remains lethargic. BUN rising and currently 122 with a creatinine of 1.86. Urine output over the preceding 24 hours was 2.4 L.  Current medications: Current Facility-Administered Medications  Medication Dose Route Frequency Provider Last Rate Last Admin  . 0.9 %  sodium chloride infusion (Manually program via Guardrails IV Fluids)   Intravenous Once Lorella Nimrod, MD      . 0.9 %  sodium chloride infusion  250 mL Intravenous PRN Agbata, Tochukwu, MD      . acetaminophen (TYLENOL) tablet 650 mg  650 mg Oral Q6H PRN Agbata, Tochukwu, MD      . albumin human 25 % solution 12.5 g  12.5 g Intravenous TID Murlean Iba, MD 60 mL/hr at 08/02/19 1113 12.5 g at 08/02/19 1113  . ascorbic acid (VITAMIN C) tablet 500 mg  500 mg Oral Daily Lorella Nimrod, MD   500 mg at 08/02/19 1115  . cephALEXin (KEFLEX) capsule 250 mg  250 mg Oral Q8H Lorella Nimrod, MD   250 mg at 08/02/19 0531  . chlorpheniramine-HYDROcodone (TUSSIONEX) 10-8 MG/5ML suspension 5 mL  5 mL Oral Q12H PRN Lorella Nimrod, MD      . docusate sodium (COLACE) capsule 100 mg  100 mg Oral Daily Agbata, Tochukwu, MD   100 mg at 08/01/19 1043  . ferrous sulfate tablet 325 mg  325 mg Oral Daily Agbata, Tochukwu, MD   325 mg at 08/02/19 0839  . furosemide (LASIX) 250 mg in dextrose 5 % 250 mL (1 mg/mL) infusion  4 mg/hr Intravenous Continuous Murlean Iba, MD 4 mL/hr at 08/02/19 0500 4 mg/hr at 08/02/19 0500  . gluconic acid-citric acid (RENACIDIN) irrigation 30 mL  30 mL Irrigation QPM Lang Snow, NP   30 mL at 07/28/19 0107  . guaiFENesin-dextromethorphan (ROBITUSSIN DM) 100-10 MG/5ML syrup 10 mL  10 mL Oral  Q4H PRN Lorella Nimrod, MD      . insulin aspart (novoLOG) injection 0-15 Units  0-15 Units Subcutaneous TID AC & HS Sharion Settler, NP   20 Units at 08/02/19 1235  . insulin aspart (novoLOG) injection 6 Units  6 Units Subcutaneous TID WC Amin, Sumayya, MD      . insulin detemir (LEVEMIR) injection 20 Units  20 Units Subcutaneous BID Lorella Nimrod, MD      . Ipratropium-Albuterol (COMBIVENT) respimat 1 puff  1 puff Inhalation Q6H Lorella Nimrod, MD   1 puff at 08/02/19 0530  . loratadine (CLARITIN) tablet 10 mg  10 mg Oral Daily Agbata, Tochukwu, MD   10 mg at 08/02/19 1116  . ondansetron (ZOFRAN) injection 4 mg  4 mg Intravenous Q6H PRN Agbata, Tochukwu, MD      . pantoprazole (PROTONIX) injection 40 mg  40 mg Intravenous Q12H Lorella Nimrod, MD   40 mg at 08/02/19 1114  . pravastatin (PRAVACHOL) tablet 20 mg  20 mg  Oral [id number] Collier Bullock, MD   20 mg at 08/01/19 2129  . sodium chloride flush (NS) 0.9 % injection 3 mL  3 mL Intravenous Q12H Agbata, Tochukwu, MD   3 mL at 08/02/19 1116  . sodium chloride flush (NS) 0.9 % injection 3 mL  3 mL Intravenous PRN Agbata, Tochukwu, MD      . Vitamin D (Ergocalciferol) (DRISDOL) capsule 50,000 Units  50,000 Units Oral Q Heloise Beecham, Tochukwu, MD   50,000 Units at 07/31/19 1030  . zinc sulfate capsule 220 mg  220 mg Oral Daily Lorella Nimrod, MD   220 mg at 08/02/19 1115     Vital Signs: Blood pressure (!) 150/75, pulse 90, temperature 97.6 F (36.4 C), temperature source Axillary, resp. rate 17, height 5\' 8"  (1.727 m), weight 108 kg, SpO2 100 %.   Intake/Output Summary (Last 24 hours) at 08/02/2019 1513 Last data filed at 08/02/2019 0800 Gross per 24 hour  Intake 500 ml  Output 2900 ml  Net -2400 ml    Weight trends: Filed Weights   07/27/19 0500 07/28/19 0300 08/01/19 0500  Weight: 113.8 kg 113 kg 108 kg    Physical Exam: General:  Elderly gentleman, laying in the bed  HEENT  anicteric, dry oral mucous membranes  Neck:  Supple  Lungs:    O2, limited exam, normal effort  Heart::  No rub  Abdomen:  Soft, distended, BS present  Extremities:  Rt BKA, 2+ LE edema  Neurologic:  Lethargic, minimally interactive  Skin:  Warm, dry  Foley:  Supra pubic cathter       Lab results: Basic Metabolic Panel: Recent Labs  Lab 07/29/19 0430 07/29/19 0430 07/30/19 0740 07/30/19 0740 07/31/19 0432 08/01/19 0610 08/02/19 0548  NA 139   < > 138   < > 136 137 133*  K 4.4   < > 4.2   < > 4.2 4.3 4.2  CL 103   < > 101   < > 100 100 93*  CO2 22   < > 25   < > 27 29 28   GLUCOSE 174*   < > 284*   < > 523* 305* 378*  BUN 86*   < > 89*   < > 93* 112* 122*  CREATININE 2.29*   < > 2.28*   < > 1.97* 1.66* 1.86*  CALCIUM 8.4*   < > 8.1*   < > 8.1* 8.1* 8.1*  MG 2.4  --  2.4  --  2.1  --   --   PHOS 5.0*   < > 5.2*  --  4.1 3.1  --    < > = values in this interval not displayed.    Liver Function Tests: Recent Labs  Lab 07/31/19 0432 07/31/19 0432 08/01/19 0610  AST 17  --   --   ALT 17  --   --   ALKPHOS 87  --   --   BILITOT 0.5  --   --   PROT 6.2*  --   --   ALBUMIN 3.0*   < > 2.5*   < > = values in this interval not displayed.   No results for input(s): LIPASE, AMYLASE in the last 168 hours. No results for input(s): AMMONIA in the last 168 hours.  CBC: Recent Labs  Lab 07/29/19 0430 07/29/19 0430 07/30/19 0740 08/01/19 0610 08/01/19 0733 08/02/19 0548  WBC 11.3*   < > 9.4   < > 16.6* 15.2*  NEUTROABS 9.3*  --  7.6  --   --   --   HGB 9.6*   < > 9.3*   < > 7.0* 6.3*  HCT 30.5*   < > 29.5*   < > 22.4* 19.3*  MCV 97.1   < > 96.7   < > 97.8 93.7  PLT 200   < > 172   < > 129* 118*   < > = values in this interval not displayed.    Cardiac Enzymes: No results for input(s): CKTOTAL, TROPONINI in the last 168 hours.  BNP: Invalid input(s): POCBNP  CBG: Recent Labs  Lab 08/01/19 1153 08/01/19 1624 08/01/19 2128 08/02/19 0804 08/02/19 1143  GLUCAP 312* 291* 355* 333* 430*    Microbiology: Recent  Results (from the past 720 hour(s))  SARS CORONAVIRUS 2 (TAT 6-24 HRS) Nasopharyngeal Nasopharyngeal Swab     Status: Abnormal   Collection Time: 07/22/19  1:05 PM   Specimen: Nasopharyngeal Swab  Result Value Ref Range Status   SARS Coronavirus 2 POSITIVE (A) NEGATIVE Final    Comment: (NOTE) SARS-CoV-2 target nucleic acids are DETECTED. The SARS-CoV-2 RNA is generally detectable in upper and lower respiratory specimens during the acute phase of infection. Positive results are indicative of the presence of SARS-CoV-2 RNA. Clinical correlation with patient history and other diagnostic information is  necessary to determine patient infection status. Positive results do not rule out bacterial infection or co-infection with other viruses.  The expected result is Negative. Fact Sheet for Patients: SugarRoll.be Fact Sheet for Healthcare Providers: https://www.woods-mathews.com/ This test is not yet approved or cleared by the [place] FDA and  has been authorized for detection and/or diagnosis of SARS-CoV-2 by FDA under an Emergency Use Authorization (EUA). This EUA will remain  in effect (meaning this test can be used) for the duration of the COVID-19 declaration under Section 564(b)(1) of the Act, 21 U.S.C. se ction 360bbb-3(b)(1), unless the authorization is terminated or revoked sooner. Performed at [facility], [place] [address], [place], [place] [address]   Urine Culture     Status: Abnormal   Collection Time: 07/25/19  8:11 AM   Specimen: Urine, Random  Result Value Ref Range Status   Specimen Description   Final    URINE, RANDOM Performed at [facility], [address] [place], [place] [address]    Special Requests   Final    NONE Performed at [facility], [place], [place] [address]    Culture (A)  Final    >=100,000 COLONIES/mL PROTEUS MIRABILIS 50,000 COLONIES/mL KLEBSIELLA  PNEUMONIAE    Report Status 07/30/2019 FINAL  Final   Organism ID, Bacteria PROTEUS MIRABILIS (A)  Final   Organism ID, Bacteria KLEBSIELLA PNEUMONIAE (A)  Final      Susceptibility   Klebsiella pneumoniae - MIC*    AMPICILLIN RESISTANT Resistant     CEFAZOLIN <=4 SENSITIVE Sensitive     CEFTRIAXONE <=0.25 SENSITIVE Sensitive     CIPROFLOXACIN INTERMEDIATE Intermediate     GENTAMICIN <=1 SENSITIVE Sensitive     IMIPENEM <=0.25 SENSITIVE Sensitive     NITROFURANTOIN 128 RESISTANT Resistant     TRIMETH/SULFA <=20 SENSITIVE Sensitive     AMPICILLIN/SULBACTAM 4 SENSITIVE Sensitive     PIP/TAZO <=4 SENSITIVE Sensitive     * 50,000 COLONIES/mL KLEBSIELLA PNEUMONIAE   Proteus mirabilis - MIC*    AMPICILLIN <=2 SENSITIVE Sensitive     CEFAZOLIN <=4 SENSITIVE Sensitive     CEFTRIAXONE <=0.25 SENSITIVE Sensitive  CIPROFLOXACIN >=4 RESISTANT Resistant     GENTAMICIN <=1 SENSITIVE Sensitive     IMIPENEM 2 SENSITIVE Sensitive     NITROFURANTOIN 128 RESISTANT Resistant     TRIMETH/SULFA 40 SENSITIVE Sensitive     AMPICILLIN/SULBACTAM <=2 SENSITIVE Sensitive     PIP/TAZO <=4 SENSITIVE Sensitive     * >=100,000 COLONIES/mL PROTEUS MIRABILIS  Culture, blood (routine x 2)     Status: None   Collection Time: 07/25/19  3:49 PM   Specimen: BLOOD  Result Value Ref Range Status   Specimen Description BLOOD BLOOD RIGHT WRIST  Final   Special Requests   Final    BOTTLES DRAWN AEROBIC AND ANAEROBIC Blood Culture adequate volume   Culture   Final    NO GROWTH 5 DAYS Performed at [facility], [address], [place], [place] [address]    Report Status 07/30/2019 FINAL  Final  Culture, blood (routine x 2)     Status: None   Collection Time: 07/25/19  3:49 PM   Specimen: BLOOD  Result Value Ref Range Status   Specimen Description BLOOD BLOOD RIGHT ARM  Final   Special Requests   Final    BOTTLES DRAWN AEROBIC AND ANAEROBIC Blood Culture adequate volume   Culture   Final    NO GROWTH  5 DAYS Performed at [facility], [address], [place], [place] [address]    Report Status 07/30/2019 FINAL  Final     Coagulation Studies: No results for input(s): LABPROT, INR in the last 72 hours.  Urinalysis: No results for input(s): COLORURINE, LABSPEC, PHURINE, GLUCOSEU, HGBUR, BILIRUBINUR, KETONESUR, PROTEINUR, UROBILINOGEN, NITRITE, LEUKOCYTESUR in the last 72 hours.  Invalid input(s): APPERANCEUR      Imaging: No results found.   Assessment & Plan: Pt is a 86 y.o. caucasian  male with hypertension, atrial fibrillation, diabetes, chronic kidney disease, peripheral vascular disease, right BKA,, was admitted on 07/25/2019 with Elevated troponin [R77.8] Acute on chronic diastolic CHF (congestive heart failure) (HCC) [I50.33] COVID-19 virus infection [U07.1]    #Acute kidney injury on chronic kidney disease stage IIIb Baseline creatinine of 1.67/GFR 36 from June 21, 2019 AKI likely secondary to hypotension from concurrent infection, aggressive diuresis -Renal function a bit worse today as creatinine up to 1.8.  Good urine output noted over the preceding 24 hours.  We will hold Lasix drip for now.  #Generalized edema #Chronic systolic CHF, grade 1 diastolic dysfunction #Pulmonary hypertension -Given worsening in renal function we will hold Lasix drip at the moment.  #COVID-19 virus infection  Now off of dexamethasone.  With underlying multiple chronic medical conditions, overall prognosis appears poor.     LOS: 8 Karianna Gusman 3/30/20213:13 PM    Note: This note was prepared with Dragon dictation. Any transcription errors are unintentional

## 2019-08-02 NOTE — Progress Notes (Signed)
Provider returned core text and ordered an extra 5 units of insulin for a total of 26 units of insulin given for blood sugar of 415

## 2019-08-02 NOTE — Consult Note (Signed)
Lucilla Lame, MD [facility]  [address], [place], [place] [address] Phone: [phone] Fax : [phone]  Consultation  Referring Provider:     Dr. Reesa Chew Primary Care Physician:  [name], MD Primary Gastroenterologist: Althia Forts         Reason for Consultation:     GI bleed  Date of Admission:  07/25/2019 Date of Consultation:  08/02/2019         HPI:   Miguel Torres is a 86 y.o. male who is admitted on the 22nd of this month with shortness of breath.  Patient has a history of multiple medical problems including chronic renal disease peripheral vascular disease right BKA a flutter on Eliquis, hypertension, diabetes and iron deficiency anemia patient was found to be Covid positive and admitted to the hospital.  The patient was admitted with a hemoglobin of of 9.8 and it has stayed relatively stable above 9.0 until yesterday when the hemoglobin went down to 6.8.  This morning the hemoglobin was 6.3.  The patient has been treated for acute on chronic heart failure and he has been treated for his COVID-19 infection with remdesivir and supportive care.  The patient's nursing staff reported the patient to have had black tarry stools.  I am now being asked to see the patient for melena and a significant drop in his hemoglobin.  The patient denies any history of GI bleeding and also denies any abdominal pain at the present time. In addition to what was mentioned above the patient has also had increased troponin thought to be from demand ischemia with a UTI growing Klebsiella and Proteus.  Past Medical History:  Diagnosis Date  . Anemia   . CHF (congestive heart failure) ([place])   . Diabetes mellitus without complication ([place])   . Hyperlipidemia   . Hypertension   . PVD (peripheral vascular disease) ([place])   . Skin cancer     Past Surgical History:  Procedure Laterality Date  . BASAL CELL CARCINOMA EXCISION    . History of eye surgery Bilateral   . IR CATHETER TUBE CHANGE   03/01/2018  . LOWER EXTREMITY ANGIOGRAPHY Left 06/24/2019   Procedure: Lower Extremity Angiography;  Surgeon: Katha Cabal, MD;  Location: [facility];  Service: Cardiovascular;  Laterality: Left;  . MOHS SURGERY  04/2011   the top of his head  . Right BKA  09/12/2010   Dr. Delana Meyer; Secondary gangrenous right LE  . TONSILLECTOMY      Prior to Admission medications   Medication Sig Start Date End Date Taking? Authorizing Provider  acetaminophen (TYLENOL) 325 MG tablet Take 2 tablets (650 mg total) by mouth every 6 (six) hours as needed for mild pain (or Fever >/= 101). Patient taking differently: Take 650 mg by mouth every 6 (six) hours as needed for mild pain (general discomfort Max 3 grams/24hrs.).  12/09/17  Yes Gouru, Aruna, MD  acetic acid 0.25 % irrigation Irrigate with 1 application as directed See admin instructions. Use 30 ml via irrigation every evening shift for decrease bacterial load of supra pubic catheter   Yes [provider]  apixaban (ELIQUIS) 2.5 MG TABS tablet Take 1 tablet (2.5 mg total) by mouth 2 (two) times daily. 11/04/17  Yes Henreitta Leber, MD  aspirin EC 81 MG EC tablet Take 1 tablet (81 mg total) by mouth daily. 07/02/19  Yes Sreenath, Sudheer B, MD  beta carotene w/minerals (OCUVITE) tablet Take 1 tablet by mouth daily.  Yes [provider]  bumetanide (BUMEX) 0.5 MG tablet Take 1 tablet (0.5 mg total) by mouth daily. 07/02/19  Yes Ralene Muskrat B, MD  Cholecalciferol (VITAMIN D3) 50000 units CAPS Take 50,000 Units by mouth every Sunday.    Yes [provider]  docusate sodium (COLACE) 100 MG capsule Take 100 mg by mouth daily.   Yes [provider]  felodipine (PLENDIL) 2.5 MG 24 hr tablet Take 7.5 mg by mouth daily.   Yes [provider]  ferrous sulfate 325 (65 FE) MG tablet Take 325 mg by mouth daily.    Yes [provider]  fexofenadine (ALLEGRA) 60 MG tablet Take 60 mg by mouth daily as  needed for rhinitis (seasonal allergies.).    Yes [provider]  furosemide (LASIX) 40 MG tablet Take 40 mg by mouth daily.   Yes [provider]  hydrALAZINE (APRESOLINE) 25 MG tablet Take 25 mg by mouth 3 (three) times daily.    Yes [provider]  insulin aspart (NOVOLOG) 100 UNIT/ML injection Inject 0-9 Units into the skin 3 (three) times daily with meals. Patient taking differently: Inject 3-9 Units into the skin 3 (three) times daily with meals. Sliding Scale Insulin:150-200=3 units, 201-250=5 units, 251-300=7 units, 301-350=9 units 12/21/17  Yes Epifanio Lesches, MD  insulin detemir (LEVEMIR) 100 UNIT/ML injection Inject 32 Units into the skin at bedtime.   Yes [provider]  LEVEMIR FLEXTOUCH 100 UNIT/ML Pen Inject 22 Units into the skin daily.    Yes [provider]  lovastatin (MEVACOR) 40 MG tablet TAKE 1 TABLET (40 MG TOTAL) BY MOUTH DAILY. Patient taking differently: Take 40 mg by mouth daily.  05/11/17  Yes [name], MD  metoprolol tartrate (LOPRESSOR) 25 MG tablet Take 25 mg by mouth 2 (two) times daily.   Yes [provider]    Family History  Problem Relation Age of Onset  . Diabetes Mother   . Arthritis Sister   . Diabetes Daughter      Social History   Tobacco Use  . Smoking status: Never Smoker  . Smokeless tobacco: Never Used  Substance Use Topics  . Alcohol use: No  . Drug use: No    Allergies as of 07/25/2019  . (No Known Allergies)    Review of Systems:    All systems reviewed and negative except where noted in HPI.   Physical Exam:  Vital signs in last 24 hours: Temp:  [97.6 F (36.4 C)-98.4 F (36.9 C)] 97.8 F (36.6 C) (03/30 1754) Pulse Rate:  [77-90] 77 (03/30 1754) Resp:  [11-17] 16 (03/30 1754) BP: (137-163)/(69-80) 163/69 (03/30 1754) SpO2:  [95 %-100 %] 95 % (03/30 1754) Last BM Date: 08/01/19 General:   Pleasant, cooperative in NAD appears to be hard of hearing Head:   Normocephalic and atraumatic. Eyes:   No icterus.   Conjunctiva pink. PERRLA. Ears: Hard of hearing. Neck:  Supple; no masses or thyroidomegaly Lungs: Decreased breath sounds bilaterally  Heart:  Regular rate and rhythm;  Without murmur, clicks, rubs or gallops Abdomen:  Soft, nondistended, nontender. Normal bowel sounds. No appreciable masses or hepatomegaly.  No rebound or guarding.  Rectal:  Not performed. Msk:  Symmetrical without gross deformities.   Extremities:  Without edema, cyanosis or clubbing.  Right BKA Neurologic:  Alert and oriented x3;  grossly normal neurologically. Skin: Multiple lesions of skin breakdown on his extremities and head Cervical Nodes:  No significant cervical adenopathy. Psych: Lethargic  LAB  RESULTS: Recent Labs    08/01/19 0610 08/01/19 0733 08/02/19 0548  WBC 15.8* 16.6* 15.2*  HGB 6.8* 7.0* 6.3*  HCT 20.8* 22.4* 19.3*  PLT 127* 129* 118*   BMET Recent Labs    07/31/19 0432 08/01/19 0610 08/02/19 0548  NA 136 137 133*  K 4.2 4.3 4.2  CL 100 100 93*  CO2 27 29 28   GLUCOSE 523* 305* 378*  BUN 93* 112* 122*  CREATININE 1.97* 1.66* 1.86*  CALCIUM 8.1* 8.1* 8.1*   LFT Recent Labs    07/31/19 0432 07/31/19 0432 08/01/19 0610  PROT 6.2*  --   --   ALBUMIN 3.0*   < > 2.5*  AST 17  --   --   ALT 17  --   --   ALKPHOS 87  --   --   BILITOT 0.5  --   --    < > = values in this interval not displayed.   PT/INR No results for input(s): LABPROT, INR in the last 72 hours.  STUDIES: No results found.    Impression / Plan:   Assessment: Principal Problem:   Acute on chronic diastolic CHF (congestive heart failure) (HCC) Active Problems:   Anemia of chronic disease   CKD (chronic kidney disease) stage 4, GFR 15-29 ml/min (HCC)   Diabetes mellitus with nephropathy (HCC)   Atrial flutter (HCC)   PAD (peripheral artery disease) ([place])   COVID-19 virus infection   Unstageable pressure ulcer of sacral region [facility])   Miguel Torres is a 86 y.o. y/o male with multiple medical problems and being treated for COVID-19 infection.  The patient is in an isolation room and is being seen using COVID-19 precautions.  The patient has had a significant drop in his hemoglobin with black tarry stools indicating a possible upper GI bleed.  Plan: The patient is DNR and I had spoken to the patient's daughter today in depth about the risks of any intervention at this time.  She agrees that undergoing any endoscopic procedures at this time would not be what her or the family would want since he is significantly ill.  She has been told that he can continue bleeding and that can result in death and she has also been told that proceeding with any advanced procedures can also lead to a bad outcome in this critically ill patient.  I agree with the family on holding off on any aggressive intervention at this time.  Thank you for involving me in the care of this patient.      LOS: 8 days   Lucilla Lame, MD  08/02/2019, 9:17 PM Pager [phone] 7am-5pm  Check AMION for 5pm -7am coverage and on weekends   Note: This dictation was prepared with Dragon dictation along with smaller phrase technology. Any transcriptional errors that result from this process are unintentional.

## 2019-08-02 NOTE — Progress Notes (Signed)
Inpatient Diabetes Program Recommendations  AACE/ADA: New Consensus Statement on Inpatient Glycemic Control (2015)  Target Ranges:  Prepandial:   less than 140 mg/dL      Peak postprandial:   less than 180 mg/dL (1-2 hours)      Critically ill patients:  140 - 180 mg/dL   Lab Results  Component Value Date   GLUCAP 430 (H) 08/02/2019   HGBA1C 7.7 (H) 06/27/2019    Review of Glycemic Control Results for Miguel Torres, Miguel Torres (MRN [id number]) as of 08/02/2019 14:28  Ref. Range 08/01/2019 11:53 08/01/2019 16:24 08/01/2019 21:28 08/02/2019 08:04 08/02/2019 11:43  Glucose-Capillary Latest Ref Range: 70 - 99 mg/dL 312 (H) 291 (H) 355 (H) 333 (H) 430 (H)  Diabetes history: DM 2 Outpatient Diabetes medications:  Novolog 0-9 units tid with meals, Levemir 32 units q HS Current orders for Inpatient glycemic control:  Levemir 16 units bid, Novolog moderate tid with meals, Novolog 3 units tid with meals  Inpatient Diabetes Program Recommendations:    May consider increasing Levemir to 20 units bid and increase Novolog meal coverage to 6 units tid with meals.    Thanks  Adah Perl, RN, BC-ADM Inpatient Diabetes Coordinator Pager [phone] (8a-5p)

## 2019-08-03 DIAGNOSIS — I214 Non-ST elevation (NSTEMI) myocardial infarction: Secondary | ICD-10-CM

## 2019-08-03 DIAGNOSIS — E1121 Type 2 diabetes mellitus with diabetic nephropathy: Secondary | ICD-10-CM

## 2019-08-03 LAB — GLUCOSE, CAPILLARY
Glucose-Capillary: 229 mg/dL — ABNORMAL HIGH (ref 70–99)
Glucose-Capillary: 261 mg/dL — ABNORMAL HIGH (ref 70–99)
Glucose-Capillary: 251 mg/dL — ABNORMAL HIGH (ref 70–99)
Glucose-Capillary: 373 mg/dL — ABNORMAL HIGH (ref 70–99)
Glucose-Capillary: 329 mg/dL — ABNORMAL HIGH (ref 70–99)

## 2019-08-03 LAB — TYPE AND SCREEN
ABO/RH(D): A POS
Antibody Screen: NEGATIVE
Unit division: 0

## 2019-08-03 LAB — RENAL FUNCTION PANEL
Albumin: 2.9 g/dL — ABNORMAL LOW (ref 3.5–5.0)
Anion gap: 10 (ref 5–15)
BUN: 130 mg/dL — ABNORMAL HIGH (ref 8–23)
CO2: 30 mmol/L (ref 22–32)
Calcium: 8.3 mg/dL — ABNORMAL LOW (ref 8.9–10.3)
Chloride: 95 mmol/L — ABNORMAL LOW (ref 98–111)
Creatinine, Ser: 1.81 mg/dL — ABNORMAL HIGH (ref 0.61–1.24)
GFR calc Af Amer: 38 mL/min — ABNORMAL LOW (ref >60)
GFR calc non Af Amer: 33 mL/min — ABNORMAL LOW (ref >60)
Glucose, Bld: 380 mg/dL — ABNORMAL HIGH (ref 70–99)
Phosphorus: 3.3 mg/dL (ref 2.5–4.6)
Potassium: 4.1 mmol/L (ref 3.5–5.1)
Sodium: 135 mmol/L (ref 135–145)

## 2019-08-03 LAB — CBC
HCT: 21.5 % — ABNORMAL LOW (ref 39.0–52.0)
Hemoglobin: 7 g/dL — ABNORMAL LOW (ref 13.0–17.0)
MCH: 30.8 pg (ref 26.0–34.0)
MCHC: 32.6 g/dL (ref 30.0–36.0)
MCV: 94.7 fL (ref 80.0–100.0)
Platelets: 109 10*3/uL — ABNORMAL LOW (ref 150–400)
RBC: 2.27 MIL/uL — ABNORMAL LOW (ref 4.22–5.81)
RDW: 14 % (ref 11.5–15.5)
WBC: 11.8 10*3/uL — ABNORMAL HIGH (ref 4.0–10.5)
nRBC: 0 % (ref 0.0–0.2)

## 2019-08-03 LAB — PROCALCITONIN: Procalcitonin: 0.14 ng/mL

## 2019-08-03 LAB — BPAM RBC
Blood Product Expiration Date: 202104242359
ISSUE DATE / TIME: 202103301733
Unit Type and Rh: 6200

## 2019-08-03 MED ORDER — INSULIN ASPART 100 UNIT/ML ~~LOC~~ SOLN
8.0000 [IU] | Freq: Three times a day (TID) | SUBCUTANEOUS | Status: DC
  Administered 2019-08-03 – 2019-08-05 (×8): 8 [IU] via SUBCUTANEOUS
  Filled 2019-08-03 (×6): qty 1

## 2019-08-03 NOTE — Progress Notes (Signed)
Alerted Dr. Jamse Arn that patient had a black BM. No new orders. Asymptomatic.

## 2019-08-03 NOTE — Progress Notes (Signed)
[address] [place], [place] [address] Phone [phone]. Fax [phone]  Date: 08/03/2019                  Patient Name:  Miguel Torres  MRN: [id number]  DOB: 1930/12/28  Age / Sex: 86 y.o., male         PCP: [name], MD                                               Subjective:  Patient seen and evaluated at bedside. Is arousable but non verbal.  Current medications: Current Facility-Administered Medications  Medication Dose Route Frequency Provider Last Rate Last Admin  . 0.9 %  sodium chloride infusion  250 mL Intravenous PRN Agbata, Tochukwu, MD 10 mL/hr at 08/03/19 0953 250 mL at 08/03/19 0953  . acetaminophen (TYLENOL) tablet 650 mg  650 mg Oral Q6H PRN Agbata, Tochukwu, MD   650 mg at 08/02/19 2135  . albumin human 25 % solution 12.5 g  12.5 g Intravenous TID Murlean Iba, MD 60 mL/hr at 08/03/19 0957 12.5 g at 08/03/19 0957  . ascorbic acid (VITAMIN C) tablet 500 mg  500 mg Oral Daily Lorella Nimrod, MD   500 mg at 08/03/19 0946  . chlorpheniramine-HYDROcodone (TUSSIONEX) 10-8 MG/5ML suspension 5 mL  5 mL Oral Q12H PRN Lorella Nimrod, MD   5 mL at 08/02/19 2136  . docusate sodium (COLACE) capsule 100 mg  100 mg Oral Daily Agbata, Tochukwu, MD   100 mg at 08/03/19 0946  . ferrous sulfate tablet 325 mg  325 mg Oral Daily Agbata, Tochukwu, MD   325 mg at 08/03/19 0946  . gluconic acid-citric acid (RENACIDIN) irrigation 30 mL  30 mL Irrigation QPM Lang Snow, NP   30 mL at 07/28/19 0107  . guaiFENesin-dextromethorphan (ROBITUSSIN DM) 100-10 MG/5ML syrup 10 mL  10 mL Oral Q4H PRN Lorella Nimrod, MD      . insulin aspart (novoLOG) injection 0-15 Units  0-15 Units Subcutaneous TID AC & HS Sharion Settler, NP   8 Units at 08/03/19 1305  . insulin aspart (novoLOG) injection 5 Units  5 Units Subcutaneous Once Lorella Nimrod, MD      . insulin aspart (novoLOG) injection 8 Units  8 Units Subcutaneous TID WC Vashti Hey, MD   8  Units at 08/03/19 1306  . insulin detemir (LEVEMIR) injection 20 Units  20 Units Subcutaneous BID Lorella Nimrod, MD   20 Units at 08/03/19 [phone]  . Ipratropium-Albuterol (COMBIVENT) respimat 1 puff  1 puff Inhalation Q6H Lorella Nimrod, MD   1 puff at 08/03/19 1306  . loratadine (CLARITIN) tablet 10 mg  10 mg Oral Daily Agbata, Tochukwu, MD   10 mg at 08/03/19 0946  . ondansetron (ZOFRAN) injection 4 mg  4 mg Intravenous Q6H PRN Agbata, Tochukwu, MD      . pantoprazole (PROTONIX) injection 40 mg  40 mg Intravenous Q12H Lorella Nimrod, MD   40 mg at 08/03/19 0947  . pravastatin (PRAVACHOL) tablet 20 mg  20 mg Oral q1800 Agbata, Tochukwu, MD   20 mg at 08/02/19 2135  . sodium chloride flush (NS) 0.9 % injection 3 mL  3 mL Intravenous Q12H Agbata, Tochukwu, MD   3 mL at 08/03/19 0948  . sodium chloride flush (NS) 0.9 % injection 3 mL  3 mL Intravenous  PRN Collier Bullock, MD      . Vitamin D (Ergocalciferol) (DRISDOL) capsule 50,000 Units  50,000 Units Oral Q Heloise Beecham, Tochukwu, MD   50,000 Units at 07/31/19 1030  . zinc sulfate capsule 220 mg  220 mg Oral Daily Lorella Nimrod, MD   220 mg at 08/03/19 0946     Vital Signs: Blood pressure (!) 145/68, pulse 77, temperature 97.6 F (36.4 C), temperature source Oral, resp. rate 18, height 5\' 8"  (1.727 m), weight 108.3 kg, SpO2 98 %.   Intake/Output Summary (Last 24 hours) at 08/03/2019 1357 Last data filed at 08/03/2019 0519 Gross per 24 hour  Intake 474 ml  Output 2400 ml  Net -1926 ml    Weight trends: [employer]   07/28/19 0300 08/01/19 0500 08/03/19 0500  Weight: 113 kg 108 kg 108.3 kg    Physical Exam: General:  No acute distress  HEENT  anicteric, dry oral mucous membranes  Neck:  Supple  Lungs:  [place]wn O2, limited exam, normal effort  Heart::  No rub  Abdomen:  Soft, BS present  Extremities:  Rt BKA, 2+ LE edema  Neurologic:  Lethargic, minimally interactive  Skin:  Warm, dry  Foley:  Supra pubic cathter       Lab  results: Basic Metabolic Panel: Recent Labs  Lab 07/29/19 0430 07/29/19 0430 07/30/19 0740 07/30/19 0740 07/31/19 0432 07/31/19 0432 08/01/19 0610 08/02/19 0548 08/03/19 0020  NA 139   < > 138   < > 136   < > 137 133* 135  K 4.4   < > 4.2   < > 4.2   < > 4.3 4.2 4.1  CL 103   < > 101   < > 100   < > 100 93* 95*  CO2 22   < > 25   < > 27   < > 29 28 30   GLUCOSE 174*   < > 284*   < > 523*   < > 305* 378* 380*  BUN 86*   < > 89*   < > 93*   < > 112* 122* 130*  CREATININE 2.29*   < > 2.28*   < > 1.97*   < > 1.66* 1.86* 1.81*  CALCIUM 8.4*   < > 8.1*   < > 8.1*   < > 8.1* 8.1* 8.3*  MG 2.4  --  2.4  --  2.1  --   --   --   --   PHOS 5.0*   < > 5.2*   < > 4.1  --  3.1  --  3.3   < > = values in this interval not displayed.    Liver Function Tests: Recent Labs  Lab 07/31/19 0432 08/01/19 0610 08/03/19 0020  AST 17  --   --   ALT 17  --   --   ALKPHOS 87  --   --   BILITOT 0.5  --   --   PROT 6.2*  --   --   ALBUMIN 3.0*   < > 2.9*   < > = values in this interval not displayed.   No results for input(s): LIPASE, AMYLASE in the last 168 hours. No results for input(s): AMMONIA in the last 168 hours.  CBC: Recent Labs  Lab 07/29/19 0430 07/29/19 0430 07/30/19 0740 08/01/19 0610 08/02/19 0548 08/03/19 0020  WBC 11.3*   < > 9.4   < > 15.2* 11.8*  NEUTROABS 9.3*  --  7.6  --   --   --   HGB 9.6*   < > 9.3*   < > 6.3* 7.0*  HCT 30.5*   < > 29.5*   < > 19.3* 21.5*  MCV 97.1   < > 96.7   < > 93.7 94.7  PLT 200   < > 172   < > 118* 109*   < > = values in this interval not displayed.    Cardiac Enzymes: No results for input(s): CKTOTAL, TROPONINI in the last 168 hours.  BNP: Invalid input(s): POCBNP  CBG: Recent Labs  Lab 08/02/19 1143 08/02/19 1649 08/02/19 2131 08/03/19 0802 08/03/19 1250  GLUCAP 430* 415* 419* 229* 261*    Microbiology: Recent Results (from the past 720 hour(s))  SARS CORONAVIRUS 2 (TAT 6-24 HRS) Nasopharyngeal Nasopharyngeal Swab      Status: Abnormal   Collection Time: 07/22/19  1:05 PM   Specimen: Nasopharyngeal Swab  Result Value Ref Range Status   SARS Coronavirus 2 POSITIVE (A) NEGATIVE Final    Comment: (NOTE) SARS-CoV-2 target nucleic acids are DETECTED. The SARS-CoV-2 RNA is generally detectable in upper and lower respiratory specimens during the acute phase of infection. Positive results are indicative of the presence of SARS-CoV-2 RNA. Clinical correlation with patient history and other diagnostic information is  necessary to determine patient infection status. Positive results do not rule out bacterial infection or co-infection with other viruses.  The expected result is Negative. Fact Sheet for Patients: SugarRoll.be Fact Sheet for Healthcare Providers: https://www.woods-mathews.com/ This test is not yet approved or cleared by the [place] FDA and  has been authorized for detection and/or diagnosis of SARS-CoV-2 by FDA under an Emergency Use Authorization (EUA). This EUA will remain  in effect (meaning this test can be used) for the duration of the COVID-19 declaration under Section 564(b)(1) of the Act, 21 U.S.C. se ction 360bbb-3(b)(1), unless the authorization is terminated or revoked sooner. Performed at [facility], [place] [address] [place], [place] [address]   Urine Culture     Status: Abnormal   Collection Time: 07/25/19  8:11 AM   Specimen: Urine, Random  Result Value Ref Range Status   Specimen Description   Final    URINE, RANDOM Performed at [facility], [address] [place], [place] [address]    Special Requests   Final    NONE Performed at [facility], [place], [place] [address]    Culture (A)  Final    >=100,000 COLONIES/mL PROTEUS MIRABILIS 50,000 COLONIES/mL KLEBSIELLA PNEUMONIAE    Report Status 07/30/2019 FINAL  Final   Organism ID, Bacteria PROTEUS MIRABILIS (A)  Final   Organism  ID, Bacteria KLEBSIELLA PNEUMONIAE (A)  Final      Susceptibility   Klebsiella pneumoniae - MIC*    AMPICILLIN RESISTANT Resistant     CEFAZOLIN <=4 SENSITIVE Sensitive     CEFTRIAXONE <=0.25 SENSITIVE Sensitive     CIPROFLOXACIN INTERMEDIATE Intermediate     GENTAMICIN <=1 SENSITIVE Sensitive     IMIPENEM <=0.25 SENSITIVE Sensitive     NITROFURANTOIN 128 RESISTANT Resistant     TRIMETH/SULFA <=20 SENSITIVE Sensitive     AMPICILLIN/SULBACTAM 4 SENSITIVE Sensitive     PIP/TAZO <=4 SENSITIVE Sensitive     * 50,000 COLONIES/mL KLEBSIELLA PNEUMONIAE   Proteus mirabilis - MIC*    AMPICILLIN <=2 SENSITIVE Sensitive     CEFAZOLIN <=4 SENSITIVE Sensitive     CEFTRIAXONE <=0.25 SENSITIVE Sensitive  CIPROFLOXACIN >=4 RESISTANT Resistant     GENTAMICIN <=1 SENSITIVE Sensitive     IMIPENEM 2 SENSITIVE Sensitive     NITROFURANTOIN 128 RESISTANT Resistant     TRIMETH/SULFA 40 SENSITIVE Sensitive     AMPICILLIN/SULBACTAM <=2 SENSITIVE Sensitive     PIP/TAZO <=4 SENSITIVE Sensitive     * >=100,000 COLONIES/mL PROTEUS MIRABILIS  Culture, blood (routine x 2)     Status: None   Collection Time: 07/25/19  3:49 PM   Specimen: BLOOD  Result Value Ref Range Status   Specimen Description BLOOD BLOOD RIGHT WRIST  Final   Special Requests   Final    BOTTLES DRAWN AEROBIC AND ANAEROBIC Blood Culture adequate volume   Culture   Final    NO GROWTH 5 DAYS Performed at [facility], [address] [place], [place] [address]    Report Status 07/30/2019 FINAL  Final  Culture, blood (routine x 2)     Status: None   Collection Time: 07/25/19  3:49 PM   Specimen: BLOOD  Result Value Ref Range Status   Specimen Description BLOOD BLOOD RIGHT ARM  Final   Special Requests   Final    BOTTLES DRAWN AEROBIC AND ANAEROBIC Blood Culture adequate volume   Culture   Final    NO GROWTH 5 DAYS Performed at [facility], [address] [place], [place] [address]    Report Status 07/30/2019  FINAL  Final     Coagulation Studies: No results for input(s): LABPROT, INR in the last 72 hours.  Urinalysis: No results for input(s): COLORURINE, LABSPEC, PHURINE, GLUCOSEU, HGBUR, BILIRUBINUR, KETONESUR, PROTEINUR, UROBILINOGEN, NITRITE, LEUKOCYTESUR in the last 72 hours.  Invalid input(s): APPERANCEUR      Imaging: No results found.   Assessment & Plan: Pt is a 86 y.o. caucasian  male with hypertension, atrial fibrillation, diabetes, chronic kidney disease, peripheral vascular disease, right BKA,, was admitted on 07/25/2019 with Elevated troponin [R77.8] Acute on chronic diastolic CHF (congestive heart failure) (HCC) [I50.33] COVID-19 virus infection [U07.1]    #Acute kidney injury on chronic kidney disease stage IIIb Baseline creatinine of 1.67/GFR 36 from June 21, 2019 AKI likely secondary to hypotension from concurrent infection, aggressive diuresis -BUN currently 130 with a creatinine of 1.8.  Good urine output of 3.4 L noted yesterday.  No acute indication for dialysis.  #Generalized edema #Chronic systolic CHF, grade 1 diastolic dysfunction #Pulmonary hypertension -Patient continues to have good urine output.  Lasix placed on hold given worsening azotemia.  #COVID-19 virus infection  Now off of dexamethasone.  With underlying multiple chronic medical conditions, overall prognosis appears poor.     LOS: 9 Tela Kotecki 3/31/20211:57 PM    Note: This note was prepared with Dragon dictation. Any transcription errors are unintentional

## 2019-08-03 NOTE — Progress Notes (Signed)
The patient hemoglobin has come up slightly today.  The patient's family has refused any GI intervention at this time.  I would not recommend any intervention in this acutely ill patient.  If the family decides to proceed with any endoscopic procedures or the patient greatly improved in his health status then please reconsult [place].  As for now I will sign off on this case.  Thank you very much for involving me in the care of this patient.

## 2019-08-03 NOTE — Progress Notes (Addendum)
Daily Progress Note   Patient Name: Miguel Torres       Date: 08/03/2019 DOB: 31-May-1930  Age: 86 y.o. MRN#: [id number] Attending Physician: Oren Binet* Primary Care Physician: [name], MD Admit Date: 07/25/2019  Reason for Consultation/Follow-up: Establishing goals of care  Subjective: Patient is on covid isolation. He does not answer the phone. Spoke with son Ron. He states he was updated about the bleeding at that no workup would occur at this time.   Per staff, continued poor PO intake and confusion. Discussed his poor PO intake with son, and concern for the ease of self removal of a feeding tube. We discussed his renal function, mental status, and deconditioning. He states he understands this, and that his father still has the vascular issues to contend with even if his father were able to return to previous baseline. His son discussed his father's decline over the past few weeks.   He states his father is tough and he and his family "want to know we did all we could reasonably". We discussed QOL, and discussing within the family what "reasonably" means to them. He states he wants the medical team (not palliative) to tell them when there is nothing more that can be done for him, and then they would be at peace.  Discussed current path and multiple scenarios. Discussed comfort focused care. He discusses that the patient lives with his wife in the nursing facility, and they are worried she could die soon herself.   He states he will see if his family wants to have a family [place] conversation. He endorses he is the 1st HPOA, and she is 2nd HPOA if he is unable. He states he will email this document to me this evening. Epic DPR lists [place] and then [place].   Will follow up  tomorrow.     Length of Stay: 9  Current Medications: Scheduled Meds:  . vitamin C  500 mg Oral Daily  . docusate sodium  100 mg Oral Daily  . ferrous sulfate  325 mg Oral Daily  . gluconic acid-citric acid  30 mL Irrigation QPM  . insulin aspart  0-15 Units Subcutaneous TID AC & HS  . insulin aspart  5 Units Subcutaneous Once  . insulin aspart  8 Units Subcutaneous TID WC  .  insulin detemir  20 Units Subcutaneous BID  . Ipratropium-Albuterol  1 puff Inhalation Q6H  . loratadine  10 mg Oral Daily  . pantoprazole (PROTONIX) IV  40 mg Intravenous Q12H  . pravastatin  20 mg Oral q1800  . sodium chloride flush  3 mL Intravenous Q12H  . Vitamin D (Ergocalciferol)  50,000 Units Oral Q Sun  . zinc sulfate  220 mg Oral Daily    Continuous Infusions: . sodium chloride 250 mL (08/03/19 0953)  . albumin human 12.5 g (08/03/19 0957)    PRN Meds: sodium chloride, acetaminophen, chlorpheniramine-HYDROcodone, guaiFENesin-dextromethorphan, ondansetron (ZOFRAN) IV, sodium chloride flush    Vital Signs: BP (!) 145/68 (BP Location: Right Arm)   Pulse 77   Temp 97.6 F (36.4 C) (Oral)   Resp 18   Ht 5\' 8"  (1.727 m)   Wt 108.3 kg   SpO2 98%   BMI 36.30 kg/m  SpO2: SpO2: 98 % O2 Device: O2 Device: Nasal Cannula O2 Flow Rate: O2 Flow Rate (L/min): 4 L/min  Intake/output summary:   Intake/Output Summary (Last 24 hours) at 08/03/2019 1020 Last data filed at 08/03/2019 0932 Gross per 24 hour  Intake 474 ml  Output 2400 ml  Net -1926 ml   LBM: Last BM Date: 08/01/19 Baseline Weight: Weight: 98.9 kg Most recent weight: Weight: 108.3 kg       Palliative Assessment/Data:    Flowsheet Rows     Most Recent Value  Intake Tab  Referral Department  Hospitalist  Unit at Time of Referral  Med/Surg Unit  Palliative Care Primary Diagnosis  Cardiac  Date Notified  07/26/19  Palliative Care Type  Return patient Palliative Care  Reason for referral  Clarify Goals of Care  Date of  Admission  07/25/19  Date first seen by Palliative Care  07/27/19  # of days Palliative referral response time  1 Day(s)  # of days IP prior to Palliative referral  1  Clinical Assessment  Psychosocial & Spiritual Assessment  Palliative Care Outcomes      Patient Active Problem List   Diagnosis Date Noted  . Melena   . Upper GI bleed   . Unstageable pressure ulcer of sacral region ([place]) 07/28/2019  . COVID-19 virus infection 07/25/2019  . Acute on chronic diastolic CHF (congestive heart failure) ([place]) 06/21/2019  . HTN (hypertension) 06/21/2019  . Type II diabetes mellitus with renal manifestations ([place]) 06/21/2019  . Iron deficiency anemia 06/21/2019  . PAD (peripheral artery disease) ([place])   . Venous ulcer of left lower extremity without varicose veins ([place]) 06/13/2019  . S/P BKA (below knee amputation) unilateral, right ([place]) 05/18/2019  . Atherosclerosis of native arteries of the extremities with ulceration ([place]) 03/14/2019  . Resides in skilled nursing facility 02/24/2018  . Benign prostatic hyperplasia with urinary obstruction 12/23/2017  . Urinary retention 12/18/2017  . Acute diastolic (congestive) heart failure ([place]) 12/17/2017  . Goals of care, counseling/discussion   . Palliative care encounter   . Congestive heart failure (CHF) ([place]) 12/16/2017  . Acute diastolic CHF (congestive heart failure) ([place]) 12/16/2017  . Acute kidney injury ([place]) 12/07/2017  . Foot ulceration, left, with fat layer exposed ([place]) 11/23/2017  . Lymphedema 11/23/2017  . Shoulder pain, right 11/04/2017  . Pressure injury of skin 11/02/2017  . Elevated troponin 11/01/2017  . Microalbuminuria 10/15/2016  . BCC (basal cell carcinoma), face 06/24/2016  . Vitamin D deficiency 07/12/2015  . Retinopathy, diabetic, proliferative ([place]) 07/02/2015  . Bleeding duodenal ulcer 02/02/2015  .  Atrial flutter ([place]) 01/19/2015  . Allergic rhinitis 01/17/2015  . Anemia of chronic disease 01/17/2015  . CKD  (chronic kidney disease) stage 4, GFR 15-29 ml/min (HCC) 01/17/2015  . Diabetes mellitus with nephropathy ([place]) 01/17/2015  . Edema 01/17/2015  . Gout 01/17/2015  . Amputation of right lower extremity below knee upon examination ([place]) 01/17/2015  . HLD (hyperlipidemia) 01/17/2015  . Psoriasis 01/17/2015  . CA of skin 01/17/2015  . Acquired absence of right leg below knee ([place]) 01/17/2015  . Hypertension 10/27/2014  . Mild cognitive disorder 12/05/2013  . Postop check 02/10/2013  . Cancer of skin, squamous cell 01/21/2013  . Squamous cell carcinoma of scalp and skin of neck 03/03/2011    Palliative Care Assessment & Plan    Recommendations/Plan:  We discussed overall poor prognosis. HPOA [name] states his father is tough and he and his family "want to know we did all we could reasonably". We discussed QOL, and discussing within the family what "reasonably" means to them. He states he wants the medical team (not palliative) to tell them when there is nothing more that can be done for him, and then they would be at peace.   Code Status:    Code Status Orders  (From admission, onward)         Start     Ordered   07/27/19 1420  Limited resuscitation (code)  Continuous    Question Answer Comment  In the event of cardiac or respiratory ARREST: Initiate Code Blue, Call Rapid Response Yes   In the event of cardiac or respiratory ARREST: Perform CPR Yes   In the event of cardiac or respiratory ARREST: Perform Intubation/Mechanical Ventilation Yes   In the event of cardiac or respiratory ARREST: Use NIPPV/BiPAp only if indicated Yes   In the event of cardiac or respiratory ARREST: Administer ACLS medications if indicated Yes   In the event of cardiac or respiratory ARREST: Perform Defibrillation or Cardioversion if indicated Yes   Comments If patient is pulseless and apneic, no intervention. If not pulseless and apneic, do ACLS.      07/27/19 1420        Code Status History    Date  Active Date Inactive Code Status Order ID Comments User Context   07/25/2019 1558 07/27/2019 1420 Full Code [id number]  Collier Bullock, MD ED   06/21/2019 1837 07/01/2019 1759 DNR [id number]  Ivor Costa, MD Inpatient   12/16/2017 1745 12/21/2017 1956 DNR [id number]  Vaughan Basta, MD Inpatient   12/16/2017 1647 12/16/2017 1745 DNR [id number]  Vaughan Basta, MD ED   12/07/2017 1424 12/09/2017 1613 Full Code [id number]  Salary, Avel Peace, MD Inpatient   11/01/2017 1358 11/04/2017 1719 Full Code [id number]  Gladstone Lighter, MD Inpatient   Advance Care Planning Activity       Prognosis:  Poor overall    Thank you for allowing the Palliative Medicine Team to assist in the care of this patient.  COVID-19 DISASTER DECLARATION:   PHYSICAL EXAMINATION WAS NOT POSSIBLE DUE TO TREATMENT OF COVID-19  AND CONSERVATION OF PERSONAL PROTECTIVE EQUIPMENT  Patient assessed or the symptoms described in the history of present illness.  In the context of the Global COVID-19 pandemic, which necessitated consideration that the patient might be at risk for infection with the SARS-CoV-2 virus that causes COVID-19, Institutional protocols and algorithms that pertain to the evaluation of patients at risk for COVID-19 are in a state of rapid change based on information released by regulatory bodies including the  CDC and federal and [employer]. These policies and algorithms were followed during the patient's care while in hospital.  Total Time 65 min Prolonged Time Billed yes      Greater than 50%  of this time was spent counseling and coordinating care related to the above assessment and plan.  Asencion Gowda, NP  Please contact Palliative Medicine Team phone at [phone] for questions and concerns.

## 2019-08-03 NOTE — Progress Notes (Signed)
PROGRESS NOTE    Miguel Torres  GEX:[id number] DOB: [birth year] DOA: 07/25/2019 PCP: [name], MD   Brief Narrative:  Miguel Torres is a 86 y.o. male with medical history significant for hypertension, dyslipidemia, diabetes mellitus with complications of stage IV chronic kidney disease, history of peripheral vascular disease status post right BKA, history of atrial flutter on Eliquis and iron deficiency anemia.  He was sent to the emergency room for evaluation of shortness of breath. Patient was recently discharged from the hospital on 07/01/19 after hospitalization for CHF. He recently tested  positive for COVID 19 virus following routine testing at the [facility] where he resides. Due to increased work of breathing EMS was called and he was brought to the ER. He was found to have a pulse oximetry of 90% on room air and was placed on 2L of oxygen. He has a new cough but denies having any fevers or chills.  Subjective:  Patient is awake and alert today.  He is able to answer yes/no questions and tells me that he is not having any belly pain, he is not in pain and he denies any shortness of breath.  He seems to understand that I am his new doctor today.  Assessment & Plan:   Principal Problem:   Acute on chronic diastolic CHF (congestive heart failure) (HCC) Active Problems:   Anemia of chronic disease   CKD (chronic kidney disease) stage 4, GFR 15-29 ml/min (HCC)   Diabetes mellitus with nephropathy (HCC)   Atrial flutter (HCC)   PAD (peripheral artery disease) (HCC)   COVID-19 virus infection   Unstageable pressure ulcer of sacral region ([place])   Melena   Upper GI bleed  Acute on chronic diastolic heart failure.  Patient has been getting aggressive diuresis with Lasix with good effect. Patient's weight is down to 108 today from a high of 126 kg on 323. However his BUN continues to rise significantly and Dr. Zollie Scale from nephrology has held his Lasix today. Continue oxygen to  keep O2 sats greater than 92%. Cardiology was consulted-they do not recommend any further work-up.  Goals of care/palliative care Very much appreciate ongoing palliative care involvement with patient's family. As discussed with patient's son [name] today, they want to continue this course of therapy to see how his father does.  He does understand that his father has multiple comorbidities with poor prognosis. Patient has been made DNR.  Melena Patient was noted to have melena with a drop in his hemoglobin from 7.0 to 6.3 He was transfused with 1 unit PRBC yesterday and his hemoglobin is now back up to 7.0. He was seen by GI who recommended no further work-up at this time given very frail state. Continue IV Protonix Follow CBC Patient remained hemodynamically stable. Family is in agreement.  AKI with CKD stage IV.   Creatinine is improved with diuresis Now down to his baseline of 1.8 from a peak of 2.6 on admission. Lasix however is being held per Dr. Zollie Scale given rising BUN. Very much appreciate ongoing renal involvement.  Diabetes mellitus with stage 4 CKD. Diabetes control is improved but not optimal. We will increase AC insulin aspart to 8 units and reassess in the morning. Continue with SSI and  mealtime coverage.  UTI   Urine culture with Proteus mirabilis and Klebsiella pneumonia with good sensitivity, resistant to nitrofurantoin.   He was started on ceftriaxone. De-escalate antibiotics to Keflex for 7 days.  COVID-19 infection.   Patient has  completed course of treatment for COVID-19. Has completed remdesivir 5 out of 5 days. Decadron today is day 9 out of 10. Oxygen supplement as needed to keep saturation above 90%.  Elevated troponin.   She had denied chest pain, was thought to be secondary to demand ischemia. Troponin had decreased from 942 to 555 He had been started on heparin infusion on admission and it was discontinued after 24-hour.  History of Atrial Flutter.   Rate controlled today. Rate controlled on present doses of metoprolol. Hold Eliquis due to GI bleed.  Peripheral arterial disease.   S/p right BKA Lower extremity ultrasound with abnormal toe brachial index. Also on left leg was seen by vascular surgery who do not have much to offer at this time. Appreciate ongoing wound care consult  Objective: Vitals:   08/03/19 0021 08/03/19 0500 08/03/19 0803 08/03/19 1600  BP: 136/69  (!) 145/68   Pulse: 76  77   Resp: 16  18   Temp: 97.8 F (36.6 C)  97.6 F (36.4 C) 97.6 F (36.4 C)  TempSrc: Oral  Oral   SpO2: 98%     Weight:  108.3 kg    Height:        Intake/Output Summary (Last 24 hours) at 08/03/2019 1700 Last data filed at 08/03/2019 1500 Gross per 24 hour  Intake 731.36 ml  Output 1650 ml  Net -918.64 ml   Filed Weights   07/28/19 0300 08/01/19 0500 08/03/19 0500  Weight: 113 kg 108 kg 108.3 kg    Examination:  General exam: Chronically ill-appearing elderly man lying in bed awake and alert answering yes/no questions. Respiratory system: Decreased air entry bilaterally secondary to body habitus and decreased inspiratory effort. Cardiovascular system: S1 & S2 heard, RRR. No JVD, murmurs, rubs, gallops or clicks. Gastrointestinal system: Soft, nontender, nondistended, bowel sounds positive. Extremities: Right BKA, left lower extremity with Unna boot, generalized anasarca.   DVT prophylaxis: SCd-discontinuing Eliquis due to GI bleed. Code Status: DNR Family Communication: Spoke with son [name] today  Disposition Plan: Unclear.  Patient is in limbo in terms of his medical condition with ongoing anasarca and increasing BUN.  Palliative care is an active discussion with family regarding goals for care.  At this point were continuing to diurese, follow CBC given melena and follow kidney function.  Consultants:   Cardiology  Nephrology  Palliative care  Vascular surgery  GI   Procedures:  Antimicrobials:   Keflex  Data Reviewed: I have personally reviewed following labs and imaging studies  CBC: Recent Labs  Lab 07/28/19 0529 07/28/19 0529 07/29/19 0430 07/29/19 0430 07/30/19 0740 08/01/19 0610 08/01/19 0733 08/02/19 0548 08/03/19 0020  WBC 10.4   < > 11.3*   < > 9.4 15.8* 16.6* 15.2* 11.8*  NEUTROABS 8.6*  --  9.3*  --  7.6  --   --   --   --   HGB 9.5*   < > 9.6*   < > 9.3* 6.8* 7.0* 6.3* 7.0*  HCT 30.6*   < > 30.5*   < > 29.5* 20.8* 22.4* 19.3* 21.5*  MCV 97.5   < > 97.1   < > 96.7 94.1 97.8 93.7 94.7  PLT 202   < > 200   < > 172 127* 129* 118* 109*   < > = values in this interval not displayed.   Basic Metabolic Panel: Recent Labs  Lab 07/28/19 0529 07/28/19 0529 07/29/19 0430 07/29/19 0430 07/30/19 0740 07/31/19 8416 08/01/19 6063 08/02/19 0160  08/03/19 0020  NA 136   < > 139   < > 138 136 137 133* 135  K 4.3   < > 4.4   < > 4.2 4.2 4.3 4.2 4.1  CL 101   < > 103   < > 101 100 100 93* 95*  CO2 23   < > 22   < > 25 27 29 28 30   GLUCOSE 170*   < > 174*   < > 284* 523* 305* 378* 380*  BUN 80*   < > 86*   < > 89* 93* 112* 122* 130*  CREATININE 2.44*   < > 2.29*   < > 2.28* 1.97* 1.66* 1.86* 1.81*  CALCIUM 8.2*   < > 8.4*   < > 8.1* 8.1* 8.1* 8.1* 8.3*  MG 2.4  --  2.4  --  2.4 2.1  --   --   --   PHOS 5.7*   < > 5.0*  --  5.2* 4.1 3.1  --  3.3   < > = values in this interval not displayed.   GFR: Estimated Creatinine Clearance: 35 mL/min (A) (by C-G formula based on SCr of 1.81 mg/dL (H)). Liver Function Tests: Recent Labs  Lab 07/28/19 0529 07/28/19 0529 07/29/19 0430 07/30/19 0740 07/31/19 0432 08/01/19 0610 08/03/19 0020  AST 24  --  21 18 17   --   --   ALT 23  --  19 16 17   --   --   ALKPHOS 123  --  113 93 87  --   --   BILITOT 1.0  --  1.0 0.7 0.5  --   --   PROT 6.5  --  6.4* 6.1* 6.2*  --   --   ALBUMIN 2.7*   < > 2.9* 2.9* 3.0* 2.5* 2.9*   < > = values in this interval not displayed.   No results for input(s): LIPASE, AMYLASE in the last  168 hours. No results for input(s): AMMONIA in the last 168 hours. Coagulation Profile: No results for input(s): INR, PROTIME in the last 168 hours. Cardiac Enzymes: No results for input(s): CKTOTAL, CKMB, CKMBINDEX, TROPONINI in the last 168 hours. BNP (last 3 results) No results for input(s): PROBNP in the last 8760 hours. HbA1C: No results for input(s): HGBA1C in the last 72 hours. CBG: Recent Labs  Lab 08/02/19 1649 08/02/19 2131 08/03/19 0802 08/03/19 1250 08/03/19 1601  GLUCAP 415* 419* 229* 261* 251*   Lipid Profile: No results for input(s): CHOL, HDL, LDLCALC, TRIG, CHOLHDL, LDLDIRECT in the last 72 hours. Thyroid Function Tests: No results for input(s): TSH, T4TOTAL, FREET4, T3FREE, THYROIDAB in the last 72 hours. Anemia Panel: No results for input(s): VITAMINB12, FOLATE, FERRITIN, TIBC, IRON, RETICCTPCT in the last 72 hours. Sepsis Labs: Recent Labs  Lab 07/28/19 0529 08/01/19 0610 08/02/19 0548 08/03/19 0020  PROCALCITON 0.39 0.14 0.15 0.14    Recent Results (from the past 240 hour(s))  Urine Culture     Status: Abnormal   Collection Time: 07/25/19  8:11 AM   Specimen: Urine, Random  Result Value Ref Range Status   Specimen Description   Final    URINE, RANDOM Performed at [facility], [address], [place], [place] [address]    Special Requests   Final    NONE Performed at [facility], [address] [place], [place] [address]    Culture (A)  Final    >=100,000 COLONIES/mL PROTEUS MIRABILIS 50,000 COLONIES/mL  KLEBSIELLA PNEUMONIAE    Report Status 07/30/2019 FINAL  Final   Organism ID, Bacteria PROTEUS MIRABILIS (A)  Final   Organism ID, Bacteria KLEBSIELLA PNEUMONIAE (A)  Final      Susceptibility   Klebsiella pneumoniae - MIC*    AMPICILLIN RESISTANT Resistant     CEFAZOLIN <=4 SENSITIVE Sensitive     CEFTRIAXONE <=0.25 SENSITIVE Sensitive     CIPROFLOXACIN INTERMEDIATE Intermediate     GENTAMICIN <=1 SENSITIVE  Sensitive     IMIPENEM <=0.25 SENSITIVE Sensitive     NITROFURANTOIN 128 RESISTANT Resistant     TRIMETH/SULFA <=20 SENSITIVE Sensitive     AMPICILLIN/SULBACTAM 4 SENSITIVE Sensitive     PIP/TAZO <=4 SENSITIVE Sensitive     * 50,000 COLONIES/mL KLEBSIELLA PNEUMONIAE   Proteus mirabilis - MIC*    AMPICILLIN <=2 SENSITIVE Sensitive     CEFAZOLIN <=4 SENSITIVE Sensitive     CEFTRIAXONE <=0.25 SENSITIVE Sensitive     CIPROFLOXACIN >=4 RESISTANT Resistant     GENTAMICIN <=1 SENSITIVE Sensitive     IMIPENEM 2 SENSITIVE Sensitive     NITROFURANTOIN 128 RESISTANT Resistant     TRIMETH/SULFA 40 SENSITIVE Sensitive     AMPICILLIN/SULBACTAM <=2 SENSITIVE Sensitive     PIP/TAZO <=4 SENSITIVE Sensitive     * >=100,000 COLONIES/mL PROTEUS MIRABILIS  Culture, blood (routine x 2)     Status: None   Collection Time: 07/25/19  3:49 PM   Specimen: BLOOD  Result Value Ref Range Status   Specimen Description BLOOD BLOOD RIGHT WRIST  Final   Special Requests   Final    BOTTLES DRAWN AEROBIC AND ANAEROBIC Blood Culture adequate volume   Culture   Final    NO GROWTH 5 DAYS Performed at [facility], [address] [place], [place] [address]    Report Status 07/30/2019 FINAL  Final  Culture, blood (routine x 2)     Status: None   Collection Time: 07/25/19  3:49 PM   Specimen: BLOOD  Result Value Ref Range Status   Specimen Description BLOOD BLOOD RIGHT ARM  Final   Special Requests   Final    BOTTLES DRAWN AEROBIC AND ANAEROBIC Blood Culture adequate volume   Culture   Final    NO GROWTH 5 DAYS Performed at [facility], [address] [place], [place] [address]    Report Status 07/30/2019 FINAL  Final     Radiology Studies: No results found.  Scheduled Meds: . vitamin C  500 mg Oral Daily  . docusate sodium  100 mg Oral Daily  . ferrous sulfate  325 mg Oral Daily  . gluconic acid-citric acid  30 mL Irrigation QPM  . insulin aspart  0-15 Units Subcutaneous TID AC &  HS  . insulin aspart  5 Units Subcutaneous Once  . insulin aspart  8 Units Subcutaneous TID WC  . insulin detemir  20 Units Subcutaneous BID  . Ipratropium-Albuterol  1 puff Inhalation Q6H  . loratadine  10 mg Oral Daily  . pantoprazole (PROTONIX) IV  40 mg Intravenous Q12H  . pravastatin  20 mg Oral q1800  . sodium chloride flush  3 mL Intravenous Q12H  . Vitamin D (Ergocalciferol)  50,000 Units Oral Q Sun  . zinc sulfate  220 mg Oral Daily   Continuous Infusions: . sodium chloride 10 mL/hr at 08/03/19 1500  . albumin human Stopped (08/03/19 1048)     LOS: 9 days   Time spent: 40 minutes.  Vashti Hey, MD  [facility]  If 7PM-7AM, please contact night-coverage [email or web address]  08/03/2019, 5:00 PM   This record has been created using Systems analyst. Errors have been sought and corrected,but may not always be located. Such creation errors do not reflect on the standard of care.

## 2019-08-04 ENCOUNTER — Ambulatory Visit: Payer: Medicare (Managed Care) | Admitting: Family

## 2019-08-04 LAB — BASIC METABOLIC PANEL
Anion gap: 10 (ref 5–15)
BUN: 118 mg/dL — ABNORMAL HIGH (ref 8–23)
CO2: 32 mmol/L (ref 22–32)
Calcium: 8.5 mg/dL — ABNORMAL LOW (ref 8.9–10.3)
Chloride: 96 mmol/L — ABNORMAL LOW (ref 98–111)
Creatinine, Ser: 1.57 mg/dL — ABNORMAL HIGH (ref 0.61–1.24)
GFR calc Af Amer: 46 mL/min — ABNORMAL LOW (ref >60)
GFR calc non Af Amer: 39 mL/min — ABNORMAL LOW (ref >60)
Glucose, Bld: 175 mg/dL — ABNORMAL HIGH (ref 70–99)
Potassium: 3.7 mmol/L (ref 3.5–5.1)
Sodium: 138 mmol/L (ref 135–145)

## 2019-08-04 LAB — CBC
HCT: 21.2 % — ABNORMAL LOW (ref 39.0–52.0)
Hemoglobin: 6.9 g/dL — ABNORMAL LOW (ref 13.0–17.0)
MCH: 31.2 pg (ref 26.0–34.0)
MCHC: 32.5 g/dL (ref 30.0–36.0)
MCV: 95.9 fL (ref 80.0–100.0)
Platelets: 106 10*3/uL — ABNORMAL LOW (ref 150–400)
RBC: 2.21 MIL/uL — ABNORMAL LOW (ref 4.22–5.81)
RDW: 14.6 % (ref 11.5–15.5)
WBC: 9.8 10*3/uL (ref 4.0–10.5)
nRBC: 0 % (ref 0.0–0.2)

## 2019-08-04 LAB — GLUCOSE, CAPILLARY
Glucose-Capillary: 121 mg/dL — ABNORMAL HIGH (ref 70–99)
Glucose-Capillary: 175 mg/dL — ABNORMAL HIGH (ref 70–99)
Glucose-Capillary: 195 mg/dL — ABNORMAL HIGH (ref 70–99)
Glucose-Capillary: 208 mg/dL — ABNORMAL HIGH (ref 70–99)

## 2019-08-04 MED ORDER — NEPRO/CARBSTEADY PO LIQD
237.0000 mL | ORAL | Status: DC
  Administered 2019-08-04 – 2019-08-10 (×5): 237 mL via ORAL

## 2019-08-04 NOTE — Care Management Important Message (Signed)
Important Message  Patient Details  Name: Miguel Torres MRN: [id number] Date of Birth: 1931-03-05   Medicare Important Message Given:  Yes     Loann Quill 08/04/2019, 11:49 AM

## 2019-08-04 NOTE — Progress Notes (Signed)
PROGRESS NOTE    Miguel Torres  GYB:[id number] DOB: [birth year] DOA: 07/25/2019 PCP: [name], MD   Brief Narrative:  Miguel Torres is a 86 y.o. male with medical history significant for hypertension, dyslipidemia, diabetes mellitus with complications of stage IV chronic kidney disease, history of peripheral vascular disease status post right BKA, history of atrial flutter on Eliquis and iron deficiency anemia.  He was sent to the emergency room for evaluation of shortness of breath. Patient was recently discharged from the hospital on 07/01/19 after hospitalization for CHF.  He recently tested  positive for COVID 19 virus following routine testing at the SNF where he resides. Due to increased work of breathing EMS was called and he was brought to the ER. He was found to have a pulse oximetry of 90% on room air and was placed on 2L of oxygen. He has a new cough but denies having any fevers or chills.  Patient is being presently treated for anasarca with diuresis.  Patient developed melena 08/02/2019 and was transfused 1 unit PRBC.  He had remained hemodynamically stable.  GI saw patient who recommended no further investigation given fraility.  Subjective:  Patient was asleep but easily arousable by voice.  He was alert and cooperative and communicative with short answers.  He states he is not in any pain.  Denied any difficulty with breathing.  States he is generally "fine".  Indicated that he recognized me from yesterday.  Had no new concerns.  Assessment & Plan:   Principal Problem:   Acute on chronic diastolic CHF (congestive heart failure) (HCC) Active Problems:   Anemia of chronic disease   CKD (chronic kidney disease) stage 4, GFR 15-29 ml/min (HCC)   Diabetes mellitus with nephropathy (HCC)   Atrial flutter (HCC)   PAD (peripheral artery disease) ([place])   COVID-19 virus infection   Unstageable pressure ulcer of sacral region ([place])   Melena   Upper GI  bleed   86 year old man with HTN, DM 2, CKD, PVD status post right BKA, A. fib flutter  Anasarca Thought to be secondary to right heart failure as echocardiogram February 2021 reveals moderately enlarged RV size with moderate reduction in RV function but with severely elevated pulmonary artery pressures.   EF of LV is 50 to 55% with global hypokinesis. Patient has been getting aggressive diuresis with Lasix with good effect. Patient's continues to decrease, today it is 105 down from 108 yesterday with a high of 126 kg on 323. Continue oxygen to keep O2 sats greater than 92%. Cardiology was consulted-they do not recommend any further work-up.  AKI with CKD stage IV.   Creatinine continues to improve, down to a low of 1.6 today, yesterday 1.8 which was thought to be his baseline and a peak of 2.6 on admission. Also improved is BUN which is 118 today down from 130 the day before.   We will continue to hold Lasix given improved BUN and creatinine and because weight continues to decrease. Very much appreciate ongoing renal involvement.  Melena H&H have remained stable overnight, patient remains hemodynamically stable. Patient was noted to have melena with a drop in his hemoglobin from 7.0 to 6.3 on 08/02/2019. He was transfused with 1 unit PRBC 08/02/2019 and his hemoglobin is now back up to 7.0. He was seen by GI who recommended no further work-up at this time given very frail state. Continue IV Protonix Follow CBC Patient remained hemodynamically stable. Family is in agreement.  Goals of  care/palliative care Very much appreciate ongoing palliative care involvement with patient's family. As discussed with patient's son [name] today, they want to continue this course of therapy to see how his father does.  He does understand that his father has multiple comorbidities with poor prognosis. Patient has been made DNR.  Diabetes mellitus with stage 4 CKD. Diabetes control is much improved with  increased AC aspart to 8 units yesterday.   Continue with SSI and  mealtime coverage.  UTI   Urine culture with Proteus mirabilis and Klebsiella pneumonia with good sensitivity, resistant to nitrofurantoin.   He was started on ceftriaxone. De-escalate antibiotics to Keflex for 7 days.  COVID-19 infection.   Patient has completed course of treatment for COVID-19. Has completed remdesivir 5 out of 5 days. Decadron today is day 9 out of 10. Oxygen supplement as needed to keep saturation above 90%.  Elevated troponin.   She had denied chest pain, was thought to be secondary to demand ischemia. Troponin had decreased from 942 to 555 He had been started on heparin infusion on admission and it was discontinued after 24-hour.  History of Atrial Flutter.  Rate controlled today. Rate controlled on present doses of metoprolol. Hold Eliquis due to GI bleed.  Peripheral arterial disease.   S/p right BKA Lower extremity ultrasound with abnormal toe brachial index. Also on left leg was seen by vascular surgery who do not have much to offer at this time. Appreciate ongoing wound care consult   Objective: Vitals:   08/03/19 1740 08/03/19 1800 08/04/19 0034 08/04/19 0429  BP:   (!) 148/52   Pulse: 93  77   Resp: 15  18   Temp:   98.1 F (36.7 C)   TempSrc:   Oral   SpO2: 99% 92% 100%   Weight:    105.7 kg  Height:        Intake/Output Summary (Last 24 hours) at 08/04/2019 1418 Last data filed at 08/04/2019 1230 Gross per 24 hour  Intake 1184.27 ml  Output 1575 ml  Net -390.73 ml   Filed Weights   08/01/19 0500 08/03/19 0500 08/04/19 0429  Weight: 108 kg 108.3 kg 105.7 kg    Examination:  General exam: Chronically ill-appearing elderly man lying in bed awake and alert answering yes/no questions. Respiratory system: Decreased air entry bilaterally secondary to body habitus and decreased inspiratory effort. Cardiovascular system: S1 & S2 heard, RRR. No JVD, murmurs, rubs, gallops  or clicks. Gastrointestinal system: Soft, nontender, nondistended, bowel sounds positive. Extremities: Right BKA, left lower extremity with Unna boot, generalized anasarca.   DVT prophylaxis: SCd-discontinuing Eliquis due to GI bleed. Code Status: DNR Family Communication: Spoke with son [name] today  Disposition Plan: Unclear.  Patient is in limbo in terms of his medical condition with ongoing anasarca.  Palliative care is an active discussion with family regarding goals for care.  At this point were continuing to diurese, follow CBC given melena and follow kidney function.  Consultants:   Cardiology  Nephrology  Palliative care  Vascular surgery  GI   Procedures:  Antimicrobials:  Keflex  Data Reviewed: I have personally reviewed following labs and imaging studies  CBC: Recent Labs  Lab 07/29/19 0430 07/29/19 0430 07/30/19 0740 07/30/19 0740 08/01/19 0610 08/01/19 0733 08/02/19 0548 08/03/19 0020 08/04/19 0449  WBC 11.3*   < > 9.4   < > 15.8* 16.6* 15.2* 11.8* 9.8  NEUTROABS 9.3*  --  7.6  --   --   --   --   --   --  HGB 9.6*   < > 9.3*   < > 6.8* 7.0* 6.3* 7.0* 6.9*  HCT 30.5*   < > 29.5*   < > 20.8* 22.4* 19.3* 21.5* 21.2*  MCV 97.1   < > 96.7   < > 94.1 97.8 93.7 94.7 95.9  PLT 200   < > 172   < > 127* 129* 118* 109* 106*   < > = values in this interval not displayed.   Basic Metabolic Panel: Recent Labs  Lab 07/29/19 0430 07/29/19 0430 07/30/19 0740 07/30/19 0740 07/31/19 0432 08/01/19 0610 08/02/19 0548 08/03/19 0020 08/04/19 0449  NA 139   < > 138   < > 136 137 133* 135 138  K 4.4   < > 4.2   < > 4.2 4.3 4.2 4.1 3.7  CL 103   < > 101   < > 100 100 93* 95* 96*  CO2 22   < > 25   < > 27 29 28 30  32  GLUCOSE 174*   < > 284*   < > 523* 305* 378* 380* 175*  BUN 86*   < > 89*   < > 93* 112* 122* 130* 118*  CREATININE 2.29*   < > 2.28*   < > 1.97* 1.66* 1.86* 1.81* 1.57*  CALCIUM 8.4*   < > 8.1*   < > 8.1* 8.1* 8.1* 8.3* 8.5*  MG 2.4  --  2.4  --   2.1  --   --   --   --   PHOS 5.0*  --  5.2*  --  4.1 3.1  --  3.3  --    < > = values in this interval not displayed.   GFR: Estimated Creatinine Clearance: 39.8 mL/min (A) (by C-G formula based on SCr of 1.57 mg/dL (H)). Liver Function Tests: Recent Labs  Lab 07/29/19 0430 07/30/19 0740 07/31/19 0432 08/01/19 0610 08/03/19 0020  AST 21 18 17   --   --   ALT 19 16 17   --   --   ALKPHOS 113 93 87  --   --   BILITOT 1.0 0.7 0.5  --   --   PROT 6.4* 6.1* 6.2*  --   --   ALBUMIN 2.9* 2.9* 3.0* 2.5* 2.9*   No results for input(s): LIPASE, AMYLASE in the last 168 hours. No results for input(s): AMMONIA in the last 168 hours. Coagulation Profile: No results for input(s): INR, PROTIME in the last 168 hours. Cardiac Enzymes: No results for input(s): CKTOTAL, CKMB, CKMBINDEX, TROPONINI in the last 168 hours. BNP (last 3 results) No results for input(s): PROBNP in the last 8760 hours. HbA1C: No results for input(s): HGBA1C in the last 72 hours. CBG: Recent Labs  Lab 08/03/19 1601 08/03/19 2059 08/03/19 2155 08/04/19 0757 08/04/19 1301  GLUCAP 251* 373* 329* 121* 175*   Lipid Profile: No results for input(s): CHOL, HDL, LDLCALC, TRIG, CHOLHDL, LDLDIRECT in the last 72 hours. Thyroid Function Tests: No results for input(s): TSH, T4TOTAL, FREET4, T3FREE, THYROIDAB in the last 72 hours. Anemia Panel: No results for input(s): VITAMINB12, FOLATE, FERRITIN, TIBC, IRON, RETICCTPCT in the last 72 hours. Sepsis Labs: Recent Labs  Lab 08/01/19 0610 08/02/19 0548 08/03/19 0020  PROCALCITON 0.14 0.15 0.14    Recent Results (from the past 240 hour(s))  Culture, blood (routine x 2)     Status: None   Collection Time: 07/25/19  3:49 PM   Specimen: BLOOD  Result Value Ref Range  Status   Specimen Description BLOOD BLOOD RIGHT WRIST  Final   Special Requests   Final    BOTTLES DRAWN AEROBIC AND ANAEROBIC Blood Culture adequate volume   Culture   Final    NO GROWTH 5 DAYS Performed  at [facility], [address] [place], [place] [address]    Report Status 07/30/2019 FINAL  Final  Culture, blood (routine x 2)     Status: None   Collection Time: 07/25/19  3:49 PM   Specimen: BLOOD  Result Value Ref Range Status   Specimen Description BLOOD BLOOD RIGHT ARM  Final   Special Requests   Final    BOTTLES DRAWN AEROBIC AND ANAEROBIC Blood Culture adequate volume   Culture   Final    NO GROWTH 5 DAYS Performed at [facility], [address] [place], [place] [address]    Report Status 07/30/2019 FINAL  Final     Radiology Studies: No results found.  Scheduled Meds: . vitamin C  500 mg Oral Daily  . docusate sodium  100 mg Oral Daily  . ferrous sulfate  325 mg Oral Daily  . gluconic acid-citric acid  30 mL Irrigation QPM  . insulin aspart  0-15 Units Subcutaneous TID AC & HS  . insulin aspart  5 Units Subcutaneous Once  . insulin aspart  8 Units Subcutaneous TID WC  . insulin detemir  20 Units Subcutaneous BID  . Ipratropium-Albuterol  1 puff Inhalation Q6H  . loratadine  10 mg Oral Daily  . pantoprazole (PROTONIX) IV  40 mg Intravenous Q12H  . pravastatin  20 mg Oral q1800  . sodium chloride flush  3 mL Intravenous Q12H  . Vitamin D (Ergocalciferol)  50,000 Units Oral Q Sun  . zinc sulfate  220 mg Oral Daily   Continuous Infusions: . sodium chloride 10 mL/hr at 08/04/19 1230  . albumin human Stopped (08/04/19 1159)     LOS: 10 days   Time spent: 40 minutes.  Vashti Hey, MD [facility]  If 7PM-7AM, please contact night-coverage [email or web address]  08/04/2019, 2:18 PM   This record has been created using Systems analyst. Errors have been sought and corrected,but may not always be located. Such creation errors do not reflect on the standard of care.

## 2019-08-04 NOTE — Progress Notes (Addendum)
Daily Progress Note   Patient Name: Miguel Torres       Date: 08/04/2019 DOB: 02-May-1931  Age: 86 y.o. MRN#: [id number] Attending Physician: Oren Binet* Primary Care Physician: [name], MD Admit Date: 07/25/2019  Reason for Consultation/Follow-up: Establishing goals of care  Subjective: Patient is on covid isolation. He does not answer the phone. Called cell number for Miguel Torres multiple times without answer. Called his home number and wife answered and stated he was busy, and added that she had tried to scan and email the HPOA documents, but was unable. Miguel Torres called me shortly after my conversation with her. He states they are continuing to watch patient's status to see how he does. Currently treating the treatable.   Primary team is continuing to update family and understands family is waiting for the medical team (not palliative) to tell them when they have done all they can. Given patient's status and prognosis, if family chooses hospice or comfort based care, he is a candidate for hospice. If they choose to continue aggressive care because there is more that can be offered, recommend D/C with palliative.    Will sign off. Please reconsult if needed.       Length of Stay: 10  Current Medications: Scheduled Meds:  . vitamin C  500 mg Oral Daily  . docusate sodium  100 mg Oral Daily  . ferrous sulfate  325 mg Oral Daily  . gluconic acid-citric acid  30 mL Irrigation QPM  . insulin aspart  0-15 Units Subcutaneous TID AC & HS  . insulin aspart  5 Units Subcutaneous Once  . insulin aspart  8 Units Subcutaneous TID WC  . insulin detemir  20 Units Subcutaneous BID  . Ipratropium-Albuterol  1 puff Inhalation Q6H  . loratadine  10 mg Oral Daily  . pantoprazole (PROTONIX) IV   40 mg Intravenous Q12H  . pravastatin  20 mg Oral q1800  . sodium chloride flush  3 mL Intravenous Q12H  . Vitamin D (Ergocalciferol)  50,000 Units Oral Q Sun  . zinc sulfate  220 mg Oral Daily    Continuous Infusions: . sodium chloride 10 mL/hr at 08/04/19 1230  . albumin human Stopped (08/04/19 1159)    PRN Meds: sodium chloride, acetaminophen, chlorpheniramine-HYDROcodone, guaiFENesin-dextromethorphan, ondansetron (ZOFRAN) IV, sodium chloride flush  Vital Signs: BP (!) 148/52 (BP Location: Left Arm)   Pulse 77   Temp 98.1 F (36.7 C) (Oral)   Resp 18   Ht 5\' 8"  (1.727 m)   Wt 105.7 kg   SpO2 100%   BMI 35.43 kg/m  SpO2: SpO2: 100 % O2 Device: O2 Device: Nasal Cannula O2 Flow Rate: O2 Flow Rate (L/min): 4 L/min  Intake/output summary:   Intake/Output Summary (Last 24 hours) at 08/04/2019 1301 Last data filed at 08/04/2019 1230 Gross per 24 hour  Intake 1184.27 ml  Output 1575 ml  Net -390.73 ml   LBM: Last BM Date: 08/02/19 Baseline Weight: Weight: 98.9 kg Most recent weight: Weight: 105.7 kg       Palliative Assessment/Data:    Flowsheet Rows     Most Recent Value  Intake Tab  Referral Department  Hospitalist  Unit at Time of Referral  Med/Surg Unit  Palliative Care Primary Diagnosis  Cardiac  Date Notified  07/26/19  Palliative Care Type  Return patient Palliative Care  Reason for referral  Clarify Goals of Care  Date of Admission  07/25/19  Date first seen by Palliative Care  07/27/19  # of days Palliative referral response time  1 Day(s)  # of days IP prior to Palliative referral  1  Clinical Assessment  Psychosocial & Spiritual Assessment  Palliative Care Outcomes      Patient Active Problem List   Diagnosis Date Noted  . Melena   . Upper GI bleed   . Unstageable pressure ulcer of sacral region ([place]) 07/28/2019  . COVID-19 virus infection 07/25/2019  . Acute on chronic diastolic CHF (congestive heart failure) ([place]) 06/21/2019  . HTN  (hypertension) 06/21/2019  . Type II diabetes mellitus with renal manifestations ([place]) 06/21/2019  . Iron deficiency anemia 06/21/2019  . PAD (peripheral artery disease) ([place])   . Venous ulcer of left lower extremity without varicose veins ([place]) 06/13/2019  . S/P BKA (below knee amputation) unilateral, right ([place]) 05/18/2019  . Atherosclerosis of native arteries of the extremities with ulceration ([place]) 03/14/2019  . Resides in skilled nursing facility 02/24/2018  . Benign prostatic hyperplasia with urinary obstruction 12/23/2017  . Urinary retention 12/18/2017  . Acute diastolic (congestive) heart failure ([place]) 12/17/2017  . Goals of care, counseling/discussion   . Palliative care encounter   . Congestive heart failure (CHF) ([place]) 12/16/2017  . Acute diastolic CHF (congestive heart failure) ([place]) 12/16/2017  . Acute kidney injury ([place]) 12/07/2017  . Foot ulceration, left, with fat layer exposed ([place]) 11/23/2017  . Lymphedema 11/23/2017  . Shoulder pain, right 11/04/2017  . Pressure injury of skin 11/02/2017  . Elevated troponin 11/01/2017  . Microalbuminuria 10/15/2016  . BCC (basal cell carcinoma), face 06/24/2016  . Vitamin D deficiency 07/12/2015  . Retinopathy, diabetic, proliferative ([place]) 07/02/2015  . Bleeding duodenal ulcer 02/02/2015  . Atrial flutter ([place]) 01/19/2015  . Allergic rhinitis 01/17/2015  . Anemia of chronic disease 01/17/2015  . CKD (chronic kidney disease) stage 4, GFR 15-29 ml/min (HCC) 01/17/2015  . Diabetes mellitus with nephropathy ([place]) 01/17/2015  . Edema 01/17/2015  . Gout 01/17/2015  . Amputation of right lower extremity below knee upon examination ([place]) 01/17/2015  . HLD (hyperlipidemia) 01/17/2015  . Psoriasis 01/17/2015  . CA of skin 01/17/2015  . Acquired absence of right leg below knee ([place]) 01/17/2015  . Hypertension 10/27/2014  . Mild cognitive disorder 12/05/2013  . Postop check 02/10/2013  . Cancer of skin, squamous cell 01/21/2013  .  Squamous cell carcinoma of scalp and skin of neck 03/03/2011    Palliative Care Assessment & Plan    Recommendations/Plan:  Primary team is continuing to update family and understands family is waiting for the medical team (not palliative) to tell them when they have done all they can. Given patient's status and prognosis, if family chooses hospice or comfort based care, he is a candidate for hospice. If they choose to continue aggressive care because there is more that can be offered, recommend D/C with palliative.  Will sign off, please reconsult if needed.   Code Status:    Code Status Orders  (From admission, onward)         Start     Ordered   07/27/19 1420  Limited resuscitation (code)  Continuous    Question Answer Comment  In the event of cardiac or respiratory ARREST: Initiate Code Blue, Call Rapid Response Yes   In the event of cardiac or respiratory ARREST: Perform CPR Yes   In the event of cardiac or respiratory ARREST: Perform Intubation/Mechanical Ventilation Yes   In the event of cardiac or respiratory ARREST: Use NIPPV/BiPAp only if indicated Yes   In the event of cardiac or respiratory ARREST: Administer ACLS medications if indicated Yes   In the event of cardiac or respiratory ARREST: Perform Defibrillation or Cardioversion if indicated Yes   Comments If patient is pulseless and apneic, no intervention. If not pulseless and apneic, do ACLS.      07/27/19 1420        Code Status History    Date Active Date Inactive Code Status Order ID Comments User Context   07/25/2019 1558 07/27/2019 1420 Full Code [id number]  Collier Bullock, MD ED   06/21/2019 1837 07/01/2019 1759 DNR [id number]  Ivor Costa, MD Inpatient   12/16/2017 1745 12/21/2017 1956 DNR [id number]  Vaughan Basta, MD Inpatient   12/16/2017 1647 12/16/2017 1745 DNR [id number]  Vaughan Basta, MD ED   12/07/2017 1424 12/09/2017 1613 Full Code [id number]  Salary, Avel Peace, MD Inpatient   11/01/2017  1358 11/04/2017 1719 Full Code [id number]  Gladstone Lighter, MD Inpatient   Advance Care Planning Activity       Prognosis:  Poor overall    Thank you for allowing the Palliative Medicine Team to assist in the care of this patient.  COVID-19 DISASTER DECLARATION:   PHYSICAL EXAMINATION WAS NOT POSSIBLE DUE TO TREATMENT OF COVID-19  AND CONSERVATION OF PERSONAL PROTECTIVE EQUIPMENT  Patient assessed or the symptoms described in the history of present illness.  In the context of the Global COVID-19 pandemic, which necessitated consideration that the patient might be at risk for infection with the SARS-CoV-2 virus that causes COVID-19, Institutional protocols and algorithms that pertain to the evaluation of patients at risk for COVID-19 are in a state of rapid change based on information released by regulatory bodies including the CDC and federal and state organizations. These policies and algorithms were followed during the patient's care while in hospital.  Total Time 35 min Prolonged Time Billed no      Greater than 50%  of this time was spent counseling and coordinating care related to the above assessment and plan.  Asencion Gowda, NP  Please contact Palliative Medicine Team phone at [phone] for questions and concerns.

## 2019-08-04 NOTE — Progress Notes (Signed)
Initial Nutrition Assessment  DOCUMENTATION CODES:   Obesity unspecified  INTERVENTION:   Nepro Shake po daily, each supplement provides 425 kcal and 19 grams protein  Vitamin C 500mg  po daily   NUTRITION DIAGNOSIS:   Increased nutrient needs related to catabolic illness(COPD, COVID 19) as evidenced by increased estimated needs.  GOAL:   Patient will meet greater than or equal to 90% of their needs  MONITOR:   PO intake, Supplement acceptance, Labs, Weight trends, Skin, I & O's  REASON FOR ASSESSMENT:   LOS    ASSESSMENT:   86 y.o. male with medical history significant for hypertension, dyslipidemia, diabetes mellitus with complications of stage IV chronic kidney disease, history of peripheral vascular disease status post right BKA, history of atrial flutter on Eliquis and iron deficiency anemia, COPD, CVA, gout, recent admission 07/01/19 for CHF, recent COVID 19 infection who is admitted with SOB  Pt is known to nutrition department and this RD from multiple previous admits. Pt with fairly good appetite and oral intake at baseline; pt also enjoys Ensure and drinks this sometimes at home. Pt currently eating 75-100% of meals in hospital. RD will add supplements to help pt meet his estimated needs and support wound healing. Will order Nepro once daily in setting of CKD IV. Per chart, pt appears fairly weight stable at baseline. Pt is down >17lbs from his admit weight but still appears to be above his UBW.   Medications reviewed and include: vitamin C, colace, ferrous sulfate, insulin, protonix, vitamin D, zinc, albumin   Labs reviewed: BUN 118(H), creat 1.57(H) Hgb 6.9(L), Hct 21.2(L) cbgs- 251, 373, 329, 121, 175 x 24 hrs AIC 7.7(H), iPTH 117(H)- 2/22  NUTRITION - FOCUSED PHYSICAL EXAM:    Most Recent Value  Orbital Region  No depletion  Upper Arm Region  No depletion  Thoracic and Lumbar Region  No depletion  Buccal Region  No depletion  Temple Region  Mild depletion   Clavicle Bone Region  No depletion  Clavicle and Acromion Bone Region  No depletion  Scapular Bone Region  No depletion  Dorsal Hand  No depletion  Patellar Region  No depletion  Anterior Thigh Region  No depletion  Posterior Calf Region  No depletion  Edema (RD Assessment)  Moderate  Hair  Reviewed  Eyes  Reviewed  Mouth  Reviewed  Skin  Reviewed  Nails  Reviewed     Diet Order:   Diet Order            DIET - DYS 1 Room service appropriate? Yes; Fluid consistency: Thin; Fluid restriction: 1200 mL Fluid  Diet effective now             EDUCATION NEEDS:   Education needs have been addressed  Skin:  Skin Assessment: Reviewed RN Assessment(Stage II buttocks, wound head, ecchymosis)  Last BM:  3/31- type 4  Height:   Ht Readings from Last 1 Encounters:  07/25/19 5\' 8"  (1.727 m)    Weight:   Wt Readings from Last 1 Encounters:  08/04/19 105.7 kg    Ideal Body Weight:  65.8 kg(adjusted for BKA)  BMI:  Body mass index is 35.43 kg/m.  Estimated Nutritional Needs:   Kcal:  2000-2300kcal/day  Protein:  100-115g/day  Fluid:  1260ml per MD  Knox Saliva MS, RD, LDN Please refer to Shea Evans for RD and/or RD on-call/weekend/after hours pager

## 2019-08-05 LAB — BASIC METABOLIC PANEL
Anion gap: 10 (ref 5–15)
BUN: 111 mg/dL — ABNORMAL HIGH (ref 8–23)
CO2: 32 mmol/L (ref 22–32)
Calcium: 8.4 mg/dL — ABNORMAL LOW (ref 8.9–10.3)
Chloride: 98 mmol/L (ref 98–111)
Creatinine, Ser: 1.72 mg/dL — ABNORMAL HIGH (ref 0.61–1.24)
GFR calc Af Amer: 41 mL/min — ABNORMAL LOW (ref >60)
GFR calc non Af Amer: 35 mL/min — ABNORMAL LOW (ref >60)
Glucose, Bld: 102 mg/dL — ABNORMAL HIGH (ref 70–99)
Potassium: 3.6 mmol/L (ref 3.5–5.1)
Sodium: 140 mmol/L (ref 135–145)

## 2019-08-05 LAB — CBC
HCT: 20.8 % — ABNORMAL LOW (ref 39.0–52.0)
Hemoglobin: 6.8 g/dL — ABNORMAL LOW (ref 13.0–17.0)
MCH: 31.3 pg (ref 26.0–34.0)
MCHC: 32.7 g/dL (ref 30.0–36.0)
MCV: 95.9 fL (ref 80.0–100.0)
Platelets: 120 10*3/uL — ABNORMAL LOW (ref 150–400)
RBC: 2.17 MIL/uL — ABNORMAL LOW (ref 4.22–5.81)
RDW: 14.9 % (ref 11.5–15.5)
WBC: 10.7 10*3/uL — ABNORMAL HIGH (ref 4.0–10.5)
nRBC: 0 % (ref 0.0–0.2)

## 2019-08-05 LAB — GLUCOSE, CAPILLARY
Glucose-Capillary: 93 mg/dL (ref 70–99)
Glucose-Capillary: 169 mg/dL — ABNORMAL HIGH (ref 70–99)
Glucose-Capillary: 192 mg/dL — ABNORMAL HIGH (ref 70–99)
Glucose-Capillary: 120 mg/dL — ABNORMAL HIGH (ref 70–99)

## 2019-08-05 NOTE — Progress Notes (Signed)
PROGRESS NOTE    Miguel Torres  WCB:[id number] DOB: 02/23/1931 DOA: 07/25/2019 PCP: [name], MD   Brief Narrative:  Miguel Torres is a 86 y.o. male with medical history significant for hypertension, dyslipidemia, diabetes mellitus with complications of stage IV chronic kidney disease, history of peripheral vascular disease status post right BKA, history of atrial flutter on Eliquis and iron deficiency anemia.  He was sent to the emergency room for evaluation of shortness of breath. Patient was recently discharged from the hospital on 07/01/19 after hospitalization for CHF.  He recently tested  positive for COVID 19 virus following routine testing at the SNF where he resides. Due to increased work of breathing EMS was called and he was brought to the ER. He was found to have a pulse oximetry of 90% on room air and was placed on 2L of oxygen. He has a new cough but denies having any fevers or chills.  Patient is being presently treated for anasarca with diuresis.  Patient developed melena 08/02/2019 and was transfused 1 unit PRBC.  He had remained hemodynamically stable.  GI saw patient who recommended no further investigation given fraility.  Subjective:  Patient was asleep but easily arousable by voice.  He was was more communicative than he had been, he was speaking in short sentences rather than just single word answers.  He said he felt okay.  Denied being any pain.   Had no new concerns  Assessment & Plan:   Principal Problem:   Acute on chronic diastolic CHF (congestive heart failure) (HCC) Active Problems:   Anemia of chronic disease   CKD (chronic kidney disease) stage 4, GFR 15-29 ml/min (HCC)   Diabetes mellitus with nephropathy (HCC)   Atrial flutter (HCC)   PAD (peripheral artery disease) ([place])   COVID-19 virus infection   Unstageable pressure ulcer of sacral region ([place])   Melena   Upper GI bleed   86 year old man with HTN, DM 2, CKD, PVD status post  right BKA, A. fib flutter  Anasarca Patient has been off of his Lasix although he is 1700 cc net negative on I's and O's since yesterday. He continues to have massive anasarca which does not seem to be improving. Weight from today has not yet been documented, will need to follow closely We will discuss restarting Lasix with renal and cardiology, I am not sure how helpful it will be given ongoing elevated BUN and ongoing massive anasarca. We will need to readdress goals for care with patient's family  Anasarca is secondary to right heart failure as echocardiogram February 2021 reveals moderately enlarged RV size with moderate reduction in RV function but with severely elevated pulmonary artery pressures.   EF of LV is 50 to 55% with global hypokinesis. Continue oxygen to keep O2 sats greater than 92%. Cardiology was consulted-they do not recommend any further work-up.  AKI with CKD stage IV.   Creatinine is stable at his baseline, and much improved from a peak of 2.6 on admission. Also improved is BUN which is 118 today down from 130 the day before.   We will continue to hold Lasix given improved BUN and creatinine and because weight continues to decrease. Very much appreciate ongoing renal involvement.  Melena H&H slowly drifting down, now 6.8 from 7.0, patient remains hemodynamically stable. Would transfuse another unit if hemoglobin is decreased tomorrow. He is status post 1 unit PRBC 08/02/2019. He was seen by GI who recommended no further work-up at this time  given very frail state. Continue IV Protonix Follow CBC Patient remained hemodynamically stable. Family is in agreement.   Goals of care/palliative care Will need to continue to address goals for care with this patient and his family As discussed with patient's son [name] today, they want to continue this course of therapy to see how his father does.  He does understand that his father has multiple comorbidities with poor  prognosis. Patient has been made DNR.  Diabetes mellitus with stage 4 CKD. Blood sugars remain under reasonable control with increased AC aspart to 8 units yesterday.   Continue with SSI and  mealtime coverage.  UTI   Urine culture with Proteus mirabilis and Klebsiella pneumonia with good sensitivity, resistant to nitrofurantoin.   He was started on ceftriaxone. De-escalate antibiotics to Keflex for 7 days.  COVID-19 infection.   Patient has completed course of treatment for COVID-19. Has completed remdesivir 5 out of 5 days. Decadron today is day 9 out of 10. Oxygen supplement as needed to keep saturation above 90%.  Elevated troponin.   She had denied chest pain, was thought to be secondary to demand ischemia. Troponin had decreased from 942 to 555 He had been started on heparin infusion on admission and it was discontinued after 24-hour.  History of Atrial Flutter.  Rate controlled today. Rate controlled on present doses of metoprolol. Hold Eliquis due to GI bleed.  Peripheral arterial disease.   S/p right BKA Lower extremity ultrasound with abnormal toe brachial index. Also on left leg was seen by vascular surgery who do not have much to offer at this time. Appreciate ongoing wound care consult   Objective: Vitals:   08/04/19 0429 08/04/19 1600 08/04/19 2300 08/05/19 0843  BP:  131/62 131/62 (!) 139/45  Pulse:  (!) 106 97 94  Resp:  18  18  Temp:   98.1 F (36.7 C) 97.8 F (36.6 C)  TempSrc:   Oral Axillary  SpO2:  92%  100%  Weight: 105.7 kg     Height:        Intake/Output Summary (Last 24 hours) at 08/05/2019 1811 Last data filed at 08/05/2019 0500 Gross per 24 hour  Intake --  Output 650 ml  Net -650 ml   Filed Weights   08/01/19 0500 08/03/19 0500 08/04/19 0429  Weight: 108 kg 108.3 kg 105.7 kg    Examination:  General exam: Chronically ill-appearing elderly man lying in bed awake and alert answering yes/no questions. Respiratory system: Decreased  air entry bilaterally secondary to body habitus and decreased inspiratory effort. Cardiovascular system: S1 and S2, regular Gastrointestinal system: Soft, nontender, nondistended, bowel sounds positive. Extremities: Right BKA, left lower extremity with Unna boot, continued massive anasarca and dependent areas.   DVT prophylaxis: SCd-discontinuing Eliquis due to GI bleed. Code Status: DNR Family Communication: Spoke with son [name] today  Disposition Plan: Unclear.  Patient is in limbo in terms of his medical condition with ongoing anasarca.  Palliative care is an active discussion with family regarding goals for care.  At this point were continuing to diurese, follow CBC given melena and follow kidney function.  Consultants:   Cardiology  Nephrology  Palliative care  Vascular surgery  GI   Procedures:  Antimicrobials:  Keflex  Data Reviewed: I have personally reviewed following labs and imaging studies  CBC: Recent Labs  Lab 07/30/19 0740 08/01/19 0610 08/01/19 0733 08/02/19 0548 08/03/19 0020 08/04/19 0449 08/05/19 0536  WBC 9.4   < > 16.6* 15.2* 11.8* 9.8  10.7*  NEUTROABS 7.6  --   --   --   --   --   --   HGB 9.3*   < > 7.0* 6.3* 7.0* 6.9* 6.8*  HCT 29.5*   < > 22.4* 19.3* 21.5* 21.2* 20.8*  MCV 96.7   < > 97.8 93.7 94.7 95.9 95.9  PLT 172   < > 129* 118* 109* 106* 120*   < > = values in this interval not displayed.   Basic Metabolic Panel: Recent Labs  Lab 07/30/19 0740 07/30/19 0740 07/31/19 7494 07/31/19 0432 08/01/19 0610 08/02/19 0548 08/03/19 0020 08/04/19 0449 08/05/19 0536  NA 138   < > 136   < > 137 133* 135 138 140  K 4.2   < > 4.2   < > 4.3 4.2 4.1 3.7 3.6  CL 101   < > 100   < > 100 93* 95* 96* 98  CO2 25   < > 27   < > 29 28 30  32 32  GLUCOSE 284*   < > 523*   < > 305* 378* 380* 175* 102*  BUN 89*   < > 93*   < > 112* 122* 130* 118* 111*  CREATININE 2.28*   < > 1.97*   < > 1.66* 1.86* 1.81* 1.57* 1.72*  CALCIUM 8.1*   < > 8.1*   < > 8.1*  8.1* 8.3* 8.5* 8.4*  MG 2.4  --  2.1  --   --   --   --   --   --   PHOS 5.2*  --  4.1  --  3.1  --  3.3  --   --    < > = values in this interval not displayed.   GFR: Estimated Creatinine Clearance: 36.3 mL/min (A) (by C-G formula based on SCr of 1.72 mg/dL (H)). Liver Function Tests: Recent Labs  Lab 07/30/19 0740 07/31/19 0432 08/01/19 0610 08/03/19 0020  AST 18 17  --   --   ALT 16 17  --   --   ALKPHOS 93 87  --   --   BILITOT 0.7 0.5  --   --   PROT 6.1* 6.2*  --   --   ALBUMIN 2.9* 3.0* 2.5* 2.9*   No results for input(s): LIPASE, AMYLASE in the last 168 hours. No results for input(s): AMMONIA in the last 168 hours. Coagulation Profile: No results for input(s): INR, PROTIME in the last 168 hours. Cardiac Enzymes: No results for input(s): CKTOTAL, CKMB, CKMBINDEX, TROPONINI in the last 168 hours. BNP (last 3 results) No results for input(s): PROBNP in the last 8760 hours. HbA1C: No results for input(s): HGBA1C in the last 72 hours. CBG: Recent Labs  Lab 08/04/19 1658 08/04/19 2218 08/05/19 0845 08/05/19 1139 08/05/19 1713  GLUCAP 195* 208* 93 169* 192*   Lipid Profile: No results for input(s): CHOL, HDL, LDLCALC, TRIG, CHOLHDL, LDLDIRECT in the last 72 hours. Thyroid Function Tests: No results for input(s): TSH, T4TOTAL, FREET4, T3FREE, THYROIDAB in the last 72 hours. Anemia Panel: No results for input(s): VITAMINB12, FOLATE, FERRITIN, TIBC, IRON, RETICCTPCT in the last 72 hours. Sepsis Labs: Recent Labs  Lab 08/01/19 0610 08/02/19 0548 08/03/19 0020  PROCALCITON 0.14 0.15 0.14    No results found for this or any previous visit (from the past 240 hour(s)).   Radiology Studies: No results found.  Scheduled Meds: . vitamin C  500 mg Oral Daily  . docusate sodium  100 mg Oral Daily  . feeding supplement (NEPRO CARB STEADY)  237 mL Oral Q24H  . ferrous sulfate  325 mg Oral Daily  . gluconic acid-citric acid  30 mL Irrigation QPM  . insulin aspart   0-15 Units Subcutaneous TID AC & HS  . insulin aspart  5 Units Subcutaneous Once  . insulin aspart  8 Units Subcutaneous TID WC  . insulin detemir  20 Units Subcutaneous BID  . Ipratropium-Albuterol  1 puff Inhalation Q6H  . loratadine  10 mg Oral Daily  . pantoprazole (PROTONIX) IV  40 mg Intravenous Q12H  . pravastatin  20 mg Oral q1800  . sodium chloride flush  3 mL Intravenous Q12H  . Vitamin D (Ergocalciferol)  50,000 Units Oral Q Sun  . zinc sulfate  220 mg Oral Daily   Continuous Infusions: . sodium chloride 10 mL/hr at 08/04/19 1230  . albumin human 12.5 g (08/05/19 1737)     LOS: 11 days   Time spent: 40 minutes.  Vashti Hey, MD [facility]  If 7PM-7AM, please contact night-coverage [email or web address]  08/05/2019, 6:11 PM   This record has been created using Systems analyst. Errors have been sought and corrected,but may not always be located. Such creation errors do not reflect on the standard of care.

## 2019-08-06 LAB — COMPREHENSIVE METABOLIC PANEL
ALT: 18 U/L (ref 0–44)
AST: 30 U/L (ref 15–41)
Albumin: 3.6 g/dL (ref 3.5–5.0)
Alkaline Phosphatase: 61 U/L (ref 38–126)
Anion gap: 12 (ref 5–15)
BUN: 97 mg/dL — ABNORMAL HIGH (ref 8–23)
CO2: 29 mmol/L (ref 22–32)
Calcium: 8.5 mg/dL — ABNORMAL LOW (ref 8.9–10.3)
Chloride: 97 mmol/L — ABNORMAL LOW (ref 98–111)
Creatinine, Ser: 1.46 mg/dL — ABNORMAL HIGH (ref 0.61–1.24)
GFR calc Af Amer: 50 mL/min — ABNORMAL LOW (ref >60)
GFR calc non Af Amer: 43 mL/min — ABNORMAL LOW (ref >60)
Glucose, Bld: 51 mg/dL — ABNORMAL LOW (ref 70–99)
Potassium: 3.8 mmol/L (ref 3.5–5.1)
Sodium: 138 mmol/L (ref 135–145)
Total Bilirubin: 1.1 mg/dL (ref 0.3–1.2)
Total Protein: 6.1 g/dL — ABNORMAL LOW (ref 6.5–8.1)

## 2019-08-06 LAB — HEMOGLOBIN AND HEMATOCRIT, BLOOD
HCT: 25 % — ABNORMAL LOW (ref 39.0–52.0)
Hemoglobin: 8 g/dL — ABNORMAL LOW (ref 13.0–17.0)

## 2019-08-06 LAB — GLUCOSE, CAPILLARY
Glucose-Capillary: 47 mg/dL — ABNORMAL LOW (ref 70–99)
Glucose-Capillary: 114 mg/dL — ABNORMAL HIGH (ref 70–99)
Glucose-Capillary: 199 mg/dL — ABNORMAL HIGH (ref 70–99)
Glucose-Capillary: 210 mg/dL — ABNORMAL HIGH (ref 70–99)

## 2019-08-06 LAB — CBC
HCT: 21.3 % — ABNORMAL LOW (ref 39.0–52.0)
Hemoglobin: 6.7 g/dL — ABNORMAL LOW (ref 13.0–17.0)
MCH: 30.7 pg (ref 26.0–34.0)
MCHC: 31.5 g/dL (ref 30.0–36.0)
MCV: 97.7 fL (ref 80.0–100.0)
Platelets: 126 10*3/uL — ABNORMAL LOW (ref 150–400)
RBC: 2.18 MIL/uL — ABNORMAL LOW (ref 4.22–5.81)
RDW: 15.4 % (ref 11.5–15.5)
WBC: 15.9 10*3/uL — ABNORMAL HIGH (ref 4.0–10.5)
nRBC: 0 % (ref 0.0–0.2)

## 2019-08-06 LAB — PREPARE RBC (CROSSMATCH)

## 2019-08-06 MED ORDER — SODIUM CHLORIDE 0.9% IV SOLUTION: Freq: Once | INTRAVENOUS | Status: AC

## 2019-08-06 MED ORDER — FUROSEMIDE 10 MG/ML IJ SOLN
80.0000 mg | Freq: Every day | INTRAMUSCULAR | Status: DC
  Administered 2019-08-06 – 2019-08-07 (×2): 80 mg via INTRAVENOUS
  Filled 2019-08-06 (×2): qty 8

## 2019-08-06 NOTE — Progress Notes (Signed)
End of Shift Summary:  Date: 08/06/19 Shift: 0700-1900 Ambulatory: reposition q2h Significant Events: 1unit PRBC transfused. Pt turned r/t stage 2 on buttocks. VSS. No other significant events.

## 2019-08-06 NOTE — Progress Notes (Signed)
Patient with CBG 47 this morning. RN provided patient with 8oz juice per hypoglycemia protocol. Patient alert, able to take PO fluids and is eating breakfast. Dr. Jamse Arn notified, who stated okay to go over 1244mL fluid restriction with the juice for hypoglycemia. Will monitor per protocol.

## 2019-08-06 NOTE — Progress Notes (Signed)
PROGRESS NOTE    Miguel Torres  CBS:[id number] DOB: [birth year] DOA: 07/25/2019 PCP: [name], MD   Brief Narrative:  Miguel Torres is a 86 y.o. male with medical history significant for hypertension, dyslipidemia, diabetes mellitus with complications of stage IV chronic kidney disease, history of peripheral vascular disease status post right BKA, history of atrial flutter on Eliquis and iron deficiency anemia.  He was sent to the emergency room for evaluation of shortness of breath. Patient was recently discharged from the hospital on 07/01/19 after hospitalization for CHF.  He recently tested  positive for COVID 19 virus following routine testing at the [facility] where he resides. Due to increased work of breathing EMS was called and he was brought to the ER. He was found to have a pulse oximetry of 90% on room air and was placed on 2L of oxygen. He has a new cough but denies having any fevers or chills.  Patient is being presently treated for anasarca with diuresis.  Patient developed melena 08/02/2019 and was transfused 1 unit PRBC.  He had remained hemodynamically stable.  GI saw patient who recommended no further investigation given fraility.  Subjective:  Patient was awake and alert, seems in better spirits than previously.  Patient felt he was doing fine.  I told him that we were trying to get rid of fluid in his body, he said it did not bother him much.  Denies shortness of breath.  Admits he is quite hungry.  Would like a shave.  Assessment & Plan:   Principal Problem:   Acute on chronic diastolic CHF (congestive heart failure) (HCC) Active Problems:   Anemia of chronic disease   CKD (chronic kidney disease) stage 4, GFR 15-29 ml/min (HCC)   Diabetes mellitus with nephropathy (HCC)   Atrial flutter (HCC)   PAD (peripheral artery disease) ([place])   COVID-19 virus infection   Unstageable pressure ulcer of sacral region ([place])   Melena   Upper GI bleed   86 year old man  with HTN, DM 2, CKD, PVD status post right BKA, A. fib flutter is admitted with pneumonia secondary to COVID-19, also noted to have anasarca which is being diuresed.  Course has been complicated by melena requiring 1 unit PRBC but being hemodynamically stable.  GI recommended no further work-up given very poor baseline health status.   Anasarca Patient discussed with renal, they agree that given 3+ proteinuria last year he may well have a component of nephrotic syndrome.  Will send urine for protein creatinine ratio. Patient's urine output has fallen off a little bit although his weight remains stable off the Lasix. Because he will need 1 unit PRBC today will give him Lasix 80 mg IV daily, follow creatinine BUN weights  Decompensated biventricular heart failure Patient has right heart failure as echocardiogram February 2021 reveals moderately enlarged RV size with moderate reduction in RV function but with severely elevated pulmonary artery pressures.   EF of LV is 50 to 55% with global hypokinesis. Continue oxygen to keep O2 sats greater than 92%. Cardiology was consulted-they do not recommend any further work-up. We will continue diuresis as noted above  AKI with CKD stage IV.   Creatinine is stable at his baseline, and much improved from a peak of 2.6 on admission. Mental status is much clearer than it was, very possibly secondary to improving BUN now down to 75 Will need to follow both closely as we restart Lasix Discontinue albumin IV as albumin is 3.5.  Melena H&H continues to slowly drift down, now 6.7 from 7.0, patient remains hemodynamically stable. Will transfuse 1 unit PRBC today to help with cardiac output, last unit given 08/02/2019 He was seen by GI who recommended no further work-up at this time given very frail state. Continue IV Protonix Follow CBC   Goals of care/palliative care Patient discussed with son today, he is pleased that his father appears to be in better  spirits Son Miguel Torres wants to pursue present course of treatment He does understand that his father has multiple comorbidities with poor prognosis. Patient has been made DNR.  Diabetes mellitus with stage 4 CKD. Blood sugars remain under reasonable control with increased AC aspart to 8 units yesterday.   Continue with SSI and  mealtime coverage.  UTI   Urine culture with Proteus mirabilis and Klebsiella pneumonia with good sensitivity, resistant to nitrofurantoin.   He was started on ceftriaxone. De-escalate antibiotics to Keflex for 7 days.  COVID-19 infection.   Patient has completed course of treatment for COVID-19. Has completed remdesivir 5 out of 5 days. Decadron today is day 10 out of 10. Oxygen supplement as needed to keep saturation above 90%.  Elevated troponin.   She had denied chest pain, was thought to be secondary to demand ischemia. Troponin had decreased from 942 to 555 He had been started on heparin infusion on admission and it was discontinued after 24-hour.  History of Atrial Flutter.  Rate controlled today. Rate controlled on present doses of metoprolol. Hold Eliquis due to GI bleed.  Peripheral arterial disease.   S/p right BKA Lower extremity ultrasound with abnormal toe brachial index. Also on left leg was seen by vascular surgery who do not have much to offer at this time. Appreciate ongoing wound care consult   Objective: Vitals:   08/06/19 0858 08/06/19 1445 08/06/19 1524 08/06/19 1545  BP: 115/64 (!) 113/51 120/63 138/67  Pulse: (!) 114 (!) 105 (!) 107 (!) 105  Resp: 20 20 (!) 22 (!) 23  Temp: 98 F (36.7 C) 98.2 F (36.8 C) 98.2 F (36.8 C) 98.4 F (36.9 C)  TempSrc: Oral Oral Oral Oral  SpO2: 94% 97% 98% 97%  Weight:      Height:        Intake/Output Summary (Last 24 hours) at 08/06/2019 1548 Last data filed at 08/06/2019 1546 Gross per 24 hour  Intake 693.65 ml  Output 1775 ml  Net -1081.35 ml   Filed Weights   08/04/19 0429 08/05/19  1600 08/06/19 0500  Weight: 105.7 kg 106 kg 105.7 kg    Examination:  General exam: Patient appears to be in better spirits, is more alert and awake and communicative. Respiratory system: Decreased air entry bilaterally secondary to body habitus and decreased inspiratory effort. Cardiovascular system: S1 and S2, regular Gastrointestinal system: Soft, nontender, nondistended, bowel sounds positive. Extremities: Right BKA, left lower extremity with Unna boot, continued massive anasarca in dependent areas.   DVT prophylaxis: SCd-discontinuing Eliquis due to GI bleed. Code Status: DNR Family Communication: Spoke with son Miguel Torres today  Disposition Plan: Unclear.  Patient is in limbo in terms of his medical condition with ongoing anasarca.  Palliative care is an active discussion with family regarding goals for care.  At this point were continuing to diurese, follow CBC given melena and follow kidney function.  Consultants:   Cardiology  Nephrology  Palliative care  Vascular surgery  GI   Procedures:  Antimicrobials:  Keflex  Data Reviewed: I have personally reviewed  following labs and imaging studies  CBC: Recent Labs  Lab 08/02/19 0548 08/03/19 0020 08/04/19 0449 08/05/19 0536 08/06/19 0554  WBC 15.2* 11.8* 9.8 10.7* 15.9*  HGB 6.3* 7.0* 6.9* 6.8* 6.7*  HCT 19.3* 21.5* 21.2* 20.8* 21.3*  MCV 93.7 94.7 95.9 95.9 97.7  PLT 118* 109* 106* 120* 096*   Basic Metabolic Panel: Recent Labs  Lab 07/31/19 0432 07/31/19 0432 08/01/19 0610 08/01/19 0610 08/02/19 0548 08/03/19 0020 08/04/19 0449 08/05/19 0536 08/06/19 0554  NA 136   < > 137   < > 133* 135 138 140 138  K 4.2   < > 4.3   < > 4.2 4.1 3.7 3.6 3.8  CL 100   < > 100   < > 93* 95* 96* 98 97*  CO2 27   < > 29   < > 28 30 32 32 29  GLUCOSE 523*   < > 305*   < > 378* 380* 175* 102* 51*  BUN 93*   < > 112*   < > 122* 130* 118* 111* 97*  CREATININE 1.97*   < > 1.66*   < > 1.86* 1.81* 1.57* 1.72* 1.46*  CALCIUM  8.1*   < > 8.1*   < > 8.1* 8.3* 8.5* 8.4* 8.5*  MG 2.1  --   --   --   --   --   --   --   --   PHOS 4.1  --  3.1  --   --  3.3  --   --   --    < > = values in this interval not displayed.   GFR: Estimated Creatinine Clearance: 42.8 mL/min (A) (by C-G formula based on SCr of 1.46 mg/dL (H)). Liver Function Tests: Recent Labs  Lab 07/31/19 0432 08/01/19 0610 08/03/19 0020 08/06/19 0554  AST 17  --   --  30  ALT 17  --   --  18  ALKPHOS 87  --   --  61  BILITOT 0.5  --   --  1.1  PROT 6.2*  --   --  6.1*  ALBUMIN 3.0* 2.5* 2.9* 3.6   No results for input(s): LIPASE, AMYLASE in the last 168 hours. No results for input(s): AMMONIA in the last 168 hours. Coagulation Profile: No results for input(s): INR, PROTIME in the last 168 hours. Cardiac Enzymes: No results for input(s): CKTOTAL, CKMB, CKMBINDEX, TROPONINI in the last 168 hours. BNP (last 3 results) No results for input(s): PROBNP in the last 8760 hours. HbA1C: No results for input(s): HGBA1C in the last 72 hours. CBG: Recent Labs  Lab 08/05/19 1713 08/05/19 2145 08/06/19 0855 08/06/19 1006 08/06/19 1220  GLUCAP 192* 120* 47* 114* 199*   Lipid Profile: No results for input(s): CHOL, HDL, LDLCALC, TRIG, CHOLHDL, LDLDIRECT in the last 72 hours. Thyroid Function Tests: No results for input(s): TSH, T4TOTAL, FREET4, T3FREE, THYROIDAB in the last 72 hours. Anemia Panel: No results for input(s): VITAMINB12, FOLATE, FERRITIN, TIBC, IRON, RETICCTPCT in the last 72 hours. Sepsis Labs: Recent Labs  Lab 08/01/19 0610 08/02/19 0548 08/03/19 0020  PROCALCITON 0.14 0.15 0.14    No results found for this or any previous visit (from the past 240 hour(s)).   Radiology Studies: No results found.  Scheduled Meds: . vitamin C  500 mg Oral Daily  . docusate sodium  100 mg Oral Daily  . feeding supplement (NEPRO CARB STEADY)  237 mL Oral Q24H  . ferrous sulfate  325 mg Oral Daily  . furosemide  80 mg Intravenous Daily  .  gluconic acid-citric acid  30 mL Irrigation QPM  . insulin aspart  0-15 Units Subcutaneous TID AC & HS  . insulin aspart  5 Units Subcutaneous Once  . insulin aspart  8 Units Subcutaneous TID WC  . insulin detemir  20 Units Subcutaneous BID  . Ipratropium-Albuterol  1 puff Inhalation Q6H  . loratadine  10 mg Oral Daily  . pantoprazole (PROTONIX) IV  40 mg Intravenous Q12H  . pravastatin  20 mg Oral q1800  . sodium chloride flush  3 mL Intravenous Q12H  . Vitamin D (Ergocalciferol)  50,000 Units Oral Q Sun  . zinc sulfate  220 mg Oral Daily   Continuous Infusions: . sodium chloride Stopped (08/05/19 2354)     LOS: 12 days   Time spent: 40 minutes.  Vashti Hey, MD [facility]  If 7PM-7AM, please contact night-coverage [email or web address]  08/06/2019, 3:48 PM   This record has been created using Systems analyst. Errors have been sought and corrected,but may not always be located. Such creation errors do not reflect on the standard of care.

## 2019-08-07 LAB — TYPE AND SCREEN
ABO/RH(D): A POS
Antibody Screen: NEGATIVE
Unit division: 0

## 2019-08-07 LAB — BASIC METABOLIC PANEL WITH GFR
Anion gap: 9 (ref 5–15)
BUN: 92 mg/dL — ABNORMAL HIGH (ref 8–23)
CO2: 32 mmol/L (ref 22–32)
Calcium: 8.4 mg/dL — ABNORMAL LOW (ref 8.9–10.3)
Chloride: 97 mmol/L — ABNORMAL LOW (ref 98–111)
Creatinine, Ser: 1.58 mg/dL — ABNORMAL HIGH (ref 0.61–1.24)
GFR calc Af Amer: 45 mL/min — ABNORMAL LOW
GFR calc non Af Amer: 39 mL/min — ABNORMAL LOW
Glucose, Bld: 67 mg/dL — ABNORMAL LOW (ref 70–99)
Potassium: 3.7 mmol/L (ref 3.5–5.1)
Sodium: 138 mmol/L (ref 135–145)

## 2019-08-07 LAB — PROTEIN / CREATININE RATIO, URINE
Creatinine, Urine: 15 mg/dL
Protein Creatinine Ratio: 1.53 mg/mg{creat} — ABNORMAL HIGH (ref 0.00–0.15)
Total Protein, Urine: 23 mg/dL

## 2019-08-07 LAB — BPAM RBC
Blood Product Expiration Date: 202105012359
ISSUE DATE / TIME: 202104031516
Unit Type and Rh: 6200

## 2019-08-07 LAB — CBC
HCT: 23.9 % — ABNORMAL LOW (ref 39.0–52.0)
Hemoglobin: 7.8 g/dL — ABNORMAL LOW (ref 13.0–17.0)
MCH: 31 pg (ref 26.0–34.0)
MCHC: 32.6 g/dL (ref 30.0–36.0)
MCV: 94.8 fL (ref 80.0–100.0)
Platelets: 131 10*3/uL — ABNORMAL LOW (ref 150–400)
RBC: 2.52 MIL/uL — ABNORMAL LOW (ref 4.22–5.81)
RDW: 16.5 % — ABNORMAL HIGH (ref 11.5–15.5)
WBC: 8.8 10*3/uL (ref 4.0–10.5)
nRBC: 0 % (ref 0.0–0.2)

## 2019-08-07 LAB — GLUCOSE, CAPILLARY
Glucose-Capillary: 262 mg/dL — ABNORMAL HIGH (ref 70–99)
Glucose-Capillary: 75 mg/dL (ref 70–99)
Glucose-Capillary: 157 mg/dL — ABNORMAL HIGH (ref 70–99)
Glucose-Capillary: 203 mg/dL — ABNORMAL HIGH (ref 70–99)
Glucose-Capillary: 338 mg/dL — ABNORMAL HIGH (ref 70–99)

## 2019-08-07 MED ORDER — INSULIN DETEMIR 100 UNIT/ML ~~LOC~~ SOLN
15.0000 [IU] | Freq: Two times a day (BID) | SUBCUTANEOUS | Status: DC
  Administered 2019-08-07 – 2019-08-09 (×5): 15 [IU] via SUBCUTANEOUS
  Filled 2019-08-07 (×7): qty 0.15

## 2019-08-07 NOTE — Progress Notes (Signed)
PROGRESS NOTE    Miguel Torres  WJX:[id number] DOB: 28-Dec-1930 DOA: 07/25/2019 PCP: [name], MD   Brief Narrative:  Miguel Torres is a 86 y.o. male with medical history significant for hypertension, dyslipidemia, diabetes mellitus with complications of stage IV chronic kidney disease, history of peripheral vascular disease status post right BKA, history of atrial flutter on Eliquis and iron deficiency anemia.  He was sent to the emergency room for evaluation of shortness of breath. Patient was recently discharged from the hospital on 07/01/19 after hospitalization for CHF.  He recently tested  positive for COVID 19 virus following routine testing at the SNF where he resides. Due to increased work of breathing EMS was called and he was brought to the ER. He was found to have a pulse oximetry of 90% on room air and was placed on 2L of oxygen. He has a new cough but denies having any fevers or chills.  Patient is being presently treated for anasarca with diuresis.  Patient developed melena 08/02/2019 and was transfused 1 unit PRBC.  He had remained hemodynamically stable.  GI saw patient who recommended no further investigation given fraility.  Subjective:  Patient is awake and alert.  He states he feels fine, as he always does.  Patient is not sure that he feels any better status post transfusion.  Denies any increased shortness of breath.  No new acute complaints, still would like to get a shave.   Assessment & Plan:   Principal Problem:   Acute on chronic diastolic CHF (congestive heart failure) (HCC) Active Problems:   Anemia of chronic disease   CKD (chronic kidney disease) stage 4, GFR 15-29 ml/min (HCC)   Diabetes mellitus with nephropathy (HCC)   Atrial flutter (HCC)   PAD (peripheral artery disease) ([place])   COVID-19 virus infection   Unstageable pressure ulcer of sacral region ([place])   Melena   Upper GI bleed   86 year old man with HTN, DM 2, CKD, PVD status  post right BKA, A. fib flutter is admitted with pneumonia secondary to COVID-19, also noted to have anasarca which is being diuresed.  Course has been complicated by melena requiring 1 unit PRBC but being hemodynamically stable.  GI recommended no further work-up given very poor baseline health status.  H/H has lifted slowly downward even though blood pressure has remained stable.  He received second unit of PRBC 08/06/19  Spoke with RN who notes that patient's blood sugars have been low normal despite not getting his Levemir or his AC insulin.  She does note that he is not eating very much.    Anasarca Patient's protein creatinine ratio is 2.88 jesting heavy proteinuria but not nephrotic range Thus it appears that the majority of patient's anasarca may well be decompensated heart failure Patient is diuresing well again on Lasix 80 mg IV, negative net 2 L yesterday. BUN and creatinine have remained stable. Weight today is still pending.  Melena Patient tolerated 2nd unit PRBC transfusion yesterday Hemoglobin is now up to 7.8. We will continue to follow Status post first unit transfusion 08/02/2019 He was seen by GI who recommended no further work-up at this time given very frail state. Continue IV Protonix Follow CBC   Decompensated biventricular heart failure Patient has right heart failure as echocardiogram February 2021 reveals moderately enlarged RV size with moderate reduction in RV function but with severely elevated pulmonary artery pressures.   EF of LV is 50 to 55% with global hypokinesis. Continue oxygen  to keep O2 sats greater than 92%. Cardiology was consulted-they do not recommend any further work-up. We will continue diuresis as noted above and as tolerated by kidneys.  AKI with CKD stage IV.   Creatinine is stable at his baseline, and much improved from a peak of 2.6 on admission. Mental status is much clearer than it was, very possibly secondary to decreasing BUN. Will need  to follow both closely as we restart Lasix Discontinue albumin IV as albumin is 3.5.  Diabetes mellitus with stage 4 CKD. Patient has had relatively lower blood sugars recently spite no change in insulin RN notes that patient has not been eating very much Will discontinue AC standing coverage Decrease Levemir to 15 twice daily (down from 20 twice daily) Continue SSI We will request diabetes coordinator for assistance.   Goals of care/palliative care Patient discussed with son yesterday, he is pleased that his father appears to be in better spirits Son Miguel Torres wants to pursue present course of treatment He does understand that his father has multiple comorbidities with poor prognosis. Patient has been made DNR.  UTI   Urine culture with Proteus mirabilis and Klebsiella pneumonia with good sensitivity, resistant to nitrofurantoin.   He was started on ceftriaxone. De-escalate antibiotics to Keflex for 7 days.  COVID-19 infection.   Patient has completed course of treatment for COVID-19. Has completed remdesivir 5 out of 5 days. He has completed 10 out of 10 days of Decadron Oxygen supplement as needed to keep saturation above 90%.  Elevated troponin.   She had denied chest pain, was thought to be secondary to demand ischemia. Troponin had decreased from 942 to 555 He had been started on heparin infusion on admission and it was discontinued after 24-hour.  History of Atrial Flutter.  Rate controlled today. Rate controlled on present doses of metoprolol. Hold Eliquis due to GI bleed.  Peripheral arterial disease.   S/p right BKA Lower extremity ultrasound with abnormal toe brachial index. Also on left leg was seen by vascular surgery who do not have much to offer at this time. Appreciate ongoing wound care consult   Objective: Vitals:   08/06/19 1545 08/06/19 1752 08/06/19 2206 08/07/19 0800  BP: 138/67 (!) 134/46 (!) 143/71 (!) 137/47  Pulse: (!) 105 (!) 101 (!) 103 87    Resp: (!) 23 (!) 34 (!) 24 19  Temp: 98.4 F (36.9 C) 98.8 F (37.1 C) 98.5 F (36.9 C) 99.3 F (37.4 C)  TempSrc: Oral Oral Oral Axillary  SpO2: 97% 100% 100% 100%  Weight:      Height:        Intake/Output Summary (Last 24 hours) at 08/07/2019 1445 Last data filed at 08/07/2019 6283 Gross per 24 hour  Intake 778 ml  Output 2950 ml  Net -2172 ml   Filed Weights   08/04/19 0429 08/05/19 1600 08/06/19 0500  Weight: 105.7 kg 106 kg 105.7 kg    Examination:  General exam: Patient is awake and alert and communicative, does seem to have a little bit more energy respiratory system: Decreased air entry bilaterally secondary to body habitus and decreased inspiratory effort. Cardiovascular system: S1 and S2, regular Gastrointestinal system: Soft, nontender, nondistended, bowel sounds positive. Extremities: Continued 3-4+ anasarca in dependent areas as before.  Status post BKA.   DVT prophylaxis: SCd-discontinuing Eliquis due to GI bleed. Code Status: DNR Family Communication: Spoke with son Miguel Torres today  Disposition Plan: Unclear.  Patient is in limbo in terms of his medical condition  with ongoing anasarca.  Palliative care is an active discussion with family regarding goals for care.  We are continuing to diurese, follow CBC given melena and follow kidney function.  Consultants:   Cardiology  Nephrology  Palliative care  Vascular surgery  GI   Procedures:  Antimicrobials:  Keflex  Data Reviewed: I have personally reviewed following labs and imaging studies  CBC: Recent Labs  Lab 08/03/19 0020 08/03/19 0020 08/04/19 0449 08/05/19 0536 08/06/19 0554 08/06/19 2006 08/07/19 0700  WBC 11.8*  --  9.8 10.7* 15.9*  --  8.8  HGB 7.0*   < > 6.9* 6.8* 6.7* 8.0* 7.8*  HCT 21.5*   < > 21.2* 20.8* 21.3* 25.0* 23.9*  MCV 94.7  --  95.9 95.9 97.7  --  94.8  PLT 109*  --  106* 120* 126*  --  131*   < > = values in this interval not displayed.   Basic Metabolic Panel: Recent  Labs  Lab 08/01/19 0610 08/02/19 0548 08/03/19 0020 08/04/19 0449 08/05/19 0536 08/06/19 0554 08/07/19 0600  NA 137   < > 135 138 140 138 138  K 4.3   < > 4.1 3.7 3.6 3.8 3.7  CL 100   < > 95* 96* 98 97* 97*  CO2 29   < > 30 32 32 29 32  GLUCOSE 305*   < > 380* 175* 102* 51* 67*  BUN 112*   < > 130* 118* 111* 97* 92*  CREATININE 1.66*   < > 1.81* 1.57* 1.72* 1.46* 1.58*  CALCIUM 8.1*   < > 8.3* 8.5* 8.4* 8.5* 8.4*  PHOS 3.1  --  3.3  --   --   --   --    < > = values in this interval not displayed.   GFR: Estimated Creatinine Clearance: 39.5 mL/min (A) (by C-G formula based on SCr of 1.58 mg/dL (H)). Liver Function Tests: Recent Labs  Lab 08/01/19 0610 08/03/19 0020 08/06/19 0554  AST  --   --  30  ALT  --   --  18  ALKPHOS  --   --  61  BILITOT  --   --  1.1  PROT  --   --  6.1*  ALBUMIN 2.5* 2.9* 3.6   No results for input(s): LIPASE, AMYLASE in the last 168 hours. No results for input(s): AMMONIA in the last 168 hours. Coagulation Profile: No results for input(s): INR, PROTIME in the last 168 hours. Cardiac Enzymes: No results for input(s): CKTOTAL, CKMB, CKMBINDEX, TROPONINI in the last 168 hours. BNP (last 3 results) No results for input(s): PROBNP in the last 8760 hours. HbA1C: No results for input(s): HGBA1C in the last 72 hours. CBG: Recent Labs  Lab 08/06/19 1006 08/06/19 1220 08/06/19 1729 08/07/19 0752 08/07/19 1106  GLUCAP 114* 199* 210* 75 157*   Lipid Profile: No results for input(s): CHOL, HDL, LDLCALC, TRIG, CHOLHDL, LDLDIRECT in the last 72 hours. Thyroid Function Tests: No results for input(s): TSH, T4TOTAL, FREET4, T3FREE, THYROIDAB in the last 72 hours. Anemia Panel: No results for input(s): VITAMINB12, FOLATE, FERRITIN, TIBC, IRON, RETICCTPCT in the last 72 hours. Sepsis Labs: Recent Labs  Lab 08/01/19 0610 08/02/19 0548 08/03/19 0020  PROCALCITON 0.14 0.15 0.14    No results found for this or any previous visit (from the past  240 hour(s)).   Radiology Studies: No results found.  Scheduled Meds: . vitamin C  500 mg Oral Daily  . docusate sodium  100  mg Oral Daily  . feeding supplement (NEPRO CARB STEADY)  237 mL Oral Q24H  . ferrous sulfate  325 mg Oral Daily  . furosemide  80 mg Intravenous Daily  . gluconic acid-citric acid  30 mL Irrigation QPM  . insulin aspart  0-15 Units Subcutaneous TID AC & HS  . insulin aspart  5 Units Subcutaneous Once  . insulin detemir  15 Units Subcutaneous BID  . Ipratropium-Albuterol  1 puff Inhalation Q6H  . loratadine  10 mg Oral Daily  . pantoprazole (PROTONIX) IV  40 mg Intravenous Q12H  . pravastatin  20 mg Oral q1800  . sodium chloride flush  3 mL Intravenous Q12H  . Vitamin D (Ergocalciferol)  50,000 Units Oral Q Sun  . zinc sulfate  220 mg Oral Daily   Continuous Infusions: . sodium chloride Stopped (08/05/19 2354)     LOS: 13 days   Time spent: 40 minutes.  Vashti Hey, MD [facility]  If 7PM-7AM, please contact night-coverage [email or web address]  08/07/2019, 2:45 PM   This record has been created using Systems analyst. Errors have been sought and corrected,but may not always be located. Such creation errors do not reflect on the standard of care.

## 2019-08-08 LAB — BASIC METABOLIC PANEL
Anion gap: 9 (ref 5–15)
BUN: 83 mg/dL — ABNORMAL HIGH (ref 8–23)
CO2: 35 mmol/L — ABNORMAL HIGH (ref 22–32)
Calcium: 8.4 mg/dL — ABNORMAL LOW (ref 8.9–10.3)
Chloride: 95 mmol/L — ABNORMAL LOW (ref 98–111)
Creatinine, Ser: 1.64 mg/dL — ABNORMAL HIGH (ref 0.61–1.24)
GFR calc Af Amer: 43 mL/min — ABNORMAL LOW (ref >60)
GFR calc non Af Amer: 37 mL/min — ABNORMAL LOW (ref >60)
Glucose, Bld: 114 mg/dL — ABNORMAL HIGH (ref 70–99)
Potassium: 3.6 mmol/L (ref 3.5–5.1)
Sodium: 139 mmol/L (ref 135–145)

## 2019-08-08 LAB — CBC
HCT: 24.1 % — ABNORMAL LOW (ref 39.0–52.0)
Hemoglobin: 7.7 g/dL — ABNORMAL LOW (ref 13.0–17.0)
MCH: 30.7 pg (ref 26.0–34.0)
MCHC: 32 g/dL (ref 30.0–36.0)
MCV: 96 fL (ref 80.0–100.0)
Platelets: 138 10*3/uL — ABNORMAL LOW (ref 150–400)
RBC: 2.51 MIL/uL — ABNORMAL LOW (ref 4.22–5.81)
RDW: 16.3 % — ABNORMAL HIGH (ref 11.5–15.5)
WBC: 7.8 10*3/uL (ref 4.0–10.5)
nRBC: 0 % (ref 0.0–0.2)

## 2019-08-08 LAB — GLUCOSE, CAPILLARY
Glucose-Capillary: 78 mg/dL (ref 70–99)
Glucose-Capillary: 262 mg/dL — ABNORMAL HIGH (ref 70–99)
Glucose-Capillary: 230 mg/dL — ABNORMAL HIGH (ref 70–99)
Glucose-Capillary: 168 mg/dL — ABNORMAL HIGH (ref 70–99)

## 2019-08-08 MED ORDER — INSULIN ASPART 100 UNIT/ML ~~LOC~~ SOLN
0.0000 [IU] | Freq: Three times a day (TID) | SUBCUTANEOUS | Status: DC
  Administered 2019-08-08: 3 [IU] via SUBCUTANEOUS
  Administered 2019-08-08: 12:00:00 8 [IU] via SUBCUTANEOUS
  Administered 2019-08-09: 2 [IU] via SUBCUTANEOUS
  Administered 2019-08-09: 17:00:00 15 [IU] via SUBCUTANEOUS
  Administered 2019-08-10: 18:00:00 5 [IU] via SUBCUTANEOUS
  Administered 2019-08-10 – 2019-08-11 (×2): 2 [IU] via SUBCUTANEOUS
  Administered 2019-08-11: 09:00:00 1 [IU] via SUBCUTANEOUS
  Administered 2019-08-11: 3 [IU] via SUBCUTANEOUS
  Administered 2019-08-12: 09:00:00 1 [IU] via SUBCUTANEOUS
  Filled 2019-08-08 (×9): qty 1

## 2019-08-08 MED ORDER — FUROSEMIDE 10 MG/ML IJ SOLN
40.0000 mg | Freq: Every day | INTRAMUSCULAR | Status: DC
  Administered 2019-08-08 – 2019-08-11 (×4): 40 mg via INTRAVENOUS
  Filled 2019-08-08 (×4): qty 4

## 2019-08-08 MED ORDER — INSULIN ASPART 100 UNIT/ML ~~LOC~~ SOLN
3.0000 [IU] | Freq: Three times a day (TID) | SUBCUTANEOUS | Status: DC
  Administered 2019-08-08 – 2019-08-12 (×8): 3 [IU] via SUBCUTANEOUS
  Filled 2019-08-08 (×8): qty 1

## 2019-08-08 NOTE — Care Management Important Message (Signed)
Important Message  Patient Details  Name: Miguel Torres MRN: [id number] Date of Birth: April 28, 1931   Medicare Important Message Given:  Yes     Shelbie Ammons, RN 08/08/2019, 1:44 PM

## 2019-08-08 NOTE — Progress Notes (Signed)
PROGRESS NOTE    Major Santerre Brocker  PJK:[id number] DOB: 06-Dec-1930 DOA: 07/25/2019 PCP: [name], MD   Brief Narrative:  Miguel Torres is a 86 y.o. male with medical history significant for hypertension, dyslipidemia, diabetes mellitus with complications of stage IV chronic kidney disease, history of peripheral vascular disease status post right BKA, history of atrial flutter on Eliquis and iron deficiency anemia.  He was sent to the emergency room for evaluation of shortness of breath. Patient was recently discharged from the hospital on 07/01/19 after hospitalization for CHF.  He recently tested  positive for COVID 19 virus following routine testing at the [facility] where he resides. Due to increased work of breathing EMS was called and he was brought to the ER. He was found to have a pulse oximetry of 90% on room air and was placed on 2L of oxygen. He has a new cough but denies having any fevers or chills.  Patient is being presently treated for anasarca with diuresis.  Patient developed melena 08/02/2019 and was transfused 1 unit PRBC.  He had remained hemodynamically stable.  GI saw patient who recommended no further investigation given fraility.  Subjective:  Patient is awake and alert.  Per usual, he has no new complaints.  He states he feels fine.    Assessment & Plan:   Principal Problem:   Acute on chronic diastolic CHF (congestive heart failure) (HCC) Active Problems:   Anemia of chronic disease   CKD (chronic kidney disease) stage 4, GFR 15-29 ml/min (HCC)   Diabetes mellitus with nephropathy (HCC)   Atrial flutter (HCC)   PAD (peripheral artery disease) ([place])   COVID-19 virus infection   Unstageable pressure ulcer of sacral region ([place])   Melena   Upper GI bleed   86 year old man with HTN, DM 2, CKD, PVD status post right BKA, A. fib flutter is admitted with pneumonia secondary to COVID-19, also noted to have anasarca which is being diuresed.  Course has been  complicated by melena requiring 1 unit PRBC but being hemodynamically stable.  GI recommended no further work-up given very poor baseline health status.  H/H has lifted slowly downward even though blood pressure has remained stable.  He received second unit of PRBC 08/06/19  Spoke with RN who notes that patient's blood sugars have been low normal despite not getting his Levemir or his AC insulin.  She does note that he is not eating very much.    Anasarca Patient continues to diurese well with weight down to 103 today Of note patient had 5 L urine output to Lasix 80, BUN and creatinine still stable. Will decrease Lasix to 40 mg IV daily and follow urine output. Patient's protein creatinine ratio is 2.88 suggesting heavy proteinuria but not nephrotic range  Melena H&H continue to trickle down daily although patient remains hemodynamically stable He is status post 2 units PRBC initially on 08/02/2019 and on 08/07/2019 He was seen by GI who recommended no further work-up at this time given very frail state. Discussed with patient's son regarding ongoing goals for care, he says he is discussing with his family. They are discussing whether they would like for GI to potentially scope him even with high morbidity but they are not sure yet. Continue IV Protonix Follow CBC   Diabetes mellitus with stage 4 CKD. Very much appreciate diabetes coordinator recommendations Blood sugars somewhat more elevated today possibly secondary to decreasing Levemir to 15 last night However as noted, since patient is off  steroids he should have decreased insulin requirements Will follow recommendations with addition of 3 units 3 times daily AC, decrease SSI to 0 to 9 unit regimen  AKI with CKD stage IV.   Creatinine is stable at his baseline, and much improved from a peak of 2.6 on admission. Mental status is much clearer than it was, likely secondary to decreasing BUN. Will need to follow both closely as we have  restarted Lasix No further IV albumin is necessary given albumin 3.5.  Decompensated biventricular heart failure Patient has right heart failure as echocardiogram February 2021 reveals moderately enlarged RV size with moderate reduction in RV function but with severely elevated pulmonary artery pressures.   EF of LV is 50 to 55% with global hypokinesis. Continue oxygen to keep O2 sats greater than 92%. Cardiology was consulted-they do not recommend any further work-up. We will continue diuresis as noted above and as tolerated by kidneys.  COVID-19 infection.   Patient has completed course of treatment for COVID-19. Has completed remdesivir 5 out of 5 days. He has completed 10 out of 10 days of Decadron Oxygen supplement as needed to keep saturation above 90%. Patient will need to be 3 weeks out of his Covid diagnosis before family can visit, family is eager to visit. He was diagnosed on 07/22/2019.  Goals of care/palliative care As noted above, patient son [name] states he is an active discussion with his brother and sister about possible ongoing goals for care given ongoing slow GI bleed and anasarca which has been difficult to treat due to both cardiac and renal dysfunction. Patient has been made DNR.  UTI   Status post treatment of UTI which grew out Proteus mirabilis and Klebsiella pneumonia  He was treated with ceftriaxone and finished up with Keflex.  Elevated troponin.   Thought to be secondary to demand ischemia on admission, peaked at 942 No further work-up warranted per cardiology  History of Atrial Flutter.  Rate controlled today. Rate controlled on present doses of metoprolol. Hold Eliquis due to GI bleed.  Peripheral arterial disease.   S/p right BKA Lower extremity ultrasound with abnormal toe brachial index. Also on left leg was seen by vascular surgery who do not have much to offer at this time. Appreciate ongoing wound care consult   Objective: Vitals:    08/08/19 0600 08/08/19 0741 08/08/19 0900 08/08/19 0923  BP:  (!) 169/95    Pulse: (!) 128 (!) 102  98  Resp: 18 19  20   Temp:  (!) 97.5 F (36.4 C)    TempSrc:  Axillary    SpO2: 100% 100%  100%  Weight:   103 kg   Height:        Intake/Output Summary (Last 24 hours) at 08/08/2019 1433 Last data filed at 08/08/2019 1100 Gross per 24 hour  Intake 240 ml  Output 2625 ml  Net -2385 ml   Filed Weights   08/05/19 1600 08/06/19 0500 08/08/19 0900  Weight: 106 kg 105.7 kg 103 kg    Examination:  General exam: Patient is awake and alert and communicative, he seems to be a little bit more forceful and have reasonable spirits.   Respiratory system: Decreased air entry bilaterally secondary to body habitus and decreased inspiratory effort. Cardiovascular system: S1 and S2, regular Gastrointestinal system: Soft, nontender, nondistended, bowel sounds positive. Extremities: Continued 3-4+ anasarca in dependent areas as before.  Status post BKA.   DVT prophylaxis: SCd-discontinuing Eliquis due to GI bleed. Code Status: DNR Family  Communication: Spoke with son [name] yesterday regarding how patient is doing, ongoing slow GI bleed and ongoing anasarca. Disposition Plan: Unclear.  Family has been wanting to pursue aggressive care although they are now in discussion regarding how effective these attempts have been.  He continues to have anasarca although with modest improvement, also continues to have slow GI bleed.  They may request palliative care to speak with them again although they may also request GI to endoscope the patient despite high morbidity.  Discussions are in flux. We are continuing to diurese, and transfuse as needed.  Consultants:   Cardiology  Nephrology  Palliative care  Vascular surgery  GI   Procedures:  Antimicrobials:  Keflex  Data Reviewed: I have personally reviewed following labs and imaging studies  CBC: Recent Labs  Lab 08/04/19 0449 08/04/19 0449  08/05/19 0536 08/06/19 0554 08/06/19 2006 08/07/19 0700 08/08/19 0517  WBC 9.8  --  10.7* 15.9*  --  8.8 7.8  HGB 6.9*   < > 6.8* 6.7* 8.0* 7.8* 7.7*  HCT 21.2*   < > 20.8* 21.3* 25.0* 23.9* 24.1*  MCV 95.9  --  95.9 97.7  --  94.8 96.0  PLT 106*  --  120* 126*  --  131* 138*   < > = values in this interval not displayed.   Basic Metabolic Panel: Recent Labs  Lab 08/03/19 0020 08/03/19 0020 08/04/19 0449 08/05/19 0536 08/06/19 0554 08/07/19 0600 08/08/19 0517  NA 135   < > 138 140 138 138 139  K 4.1   < > 3.7 3.6 3.8 3.7 3.6  CL 95*   < > 96* 98 97* 97* 95*  CO2 30   < > 32 32 29 32 35*  GLUCOSE 380*   < > 175* 102* 51* 67* 114*  BUN 130*   < > 118* 111* 97* 92* 83*  CREATININE 1.81*   < > 1.57* 1.72* 1.46* 1.58* 1.64*  CALCIUM 8.3*   < > 8.5* 8.4* 8.5* 8.4* 8.4*  PHOS 3.3  --   --   --   --   --   --    < > = values in this interval not displayed.   GFR: Estimated Creatinine Clearance: 37.6 mL/min (A) (by C-G formula based on SCr of 1.64 mg/dL (H)). Liver Function Tests: Recent Labs  Lab 08/03/19 0020 08/06/19 0554  AST  --  30  ALT  --  18  ALKPHOS  --  61  BILITOT  --  1.1  PROT  --  6.1*  ALBUMIN 2.9* 3.6   No results for input(s): LIPASE, AMYLASE in the last 168 hours. No results for input(s): AMMONIA in the last 168 hours. Coagulation Profile: No results for input(s): INR, PROTIME in the last 168 hours. Cardiac Enzymes: No results for input(s): CKTOTAL, CKMB, CKMBINDEX, TROPONINI in the last 168 hours. BNP (last 3 results) No results for input(s): PROBNP in the last 8760 hours. HbA1C: No results for input(s): HGBA1C in the last 72 hours. CBG: Recent Labs  Lab 08/07/19 1106 08/07/19 1634 08/07/19 2149 08/08/19 0743 08/08/19 1152  GLUCAP 157* 203* 338* 78 262*   Lipid Profile: No results for input(s): CHOL, HDL, LDLCALC, TRIG, CHOLHDL, LDLDIRECT in the last 72 hours. Thyroid Function Tests: No results for input(s): TSH, T4TOTAL, FREET4,  T3FREE, THYROIDAB in the last 72 hours. Anemia Panel: No results for input(s): VITAMINB12, FOLATE, FERRITIN, TIBC, IRON, RETICCTPCT in the last 72 hours. Sepsis Labs: Recent Labs  Lab  08/02/19 0548 08/03/19 0020  PROCALCITON 0.15 0.14    No results found for this or any previous visit (from the past 240 hour(s)).   Radiology Studies: No results found.  Scheduled Meds: . vitamin C  500 mg Oral Daily  . docusate sodium  100 mg Oral Daily  . feeding supplement (NEPRO CARB STEADY)  237 mL Oral Q24H  . ferrous sulfate  325 mg Oral Daily  . furosemide  40 mg Intravenous Daily  . gluconic acid-citric acid  30 mL Irrigation QPM  . insulin aspart  0-9 Units Subcutaneous TID WC  . insulin aspart  3 Units Subcutaneous TID WC  . insulin aspart  5 Units Subcutaneous Once  . insulin detemir  15 Units Subcutaneous BID  . Ipratropium-Albuterol  1 puff Inhalation Q6H  . loratadine  10 mg Oral Daily  . pantoprazole (PROTONIX) IV  40 mg Intravenous Q12H  . pravastatin  20 mg Oral q1800  . sodium chloride flush  3 mL Intravenous Q12H  . Vitamin D (Ergocalciferol)  50,000 Units Oral Q Sun  . zinc sulfate  220 mg Oral Daily   Continuous Infusions: . sodium chloride Stopped (08/05/19 2354)     LOS: 14 days   Time spent: 40 minutes.  Vashti Hey, MD [facility]  If 7PM-7AM, please contact night-coverage [email or web address]  08/08/2019, 2:33 PM   This record has been created using Systems analyst. Errors have been sought and corrected,but may not always be located. Such creation errors do not reflect on the standard of care.

## 2019-08-08 NOTE — Progress Notes (Signed)
Inpatient Diabetes Program Recommendations  AACE/ADA: New Consensus Statement on Inpatient Glycemic Control (2015)  Target Ranges:  Prepandial:   less than 140 mg/dL      Peak postprandial:   less than 180 mg/dL (1-2 hours)      Critically ill patients:  140 - 180 mg/dL   Lab Results  Component Value Date   GLUCAP 78 08/08/2019   HGBA1C 7.7 (H) 06/27/2019    Review of Glycemic Control Results for ESTHER, BROYLES (MRN [id number]) as of 08/08/2019 09:07  Ref. Range 08/07/2019 07:52 08/07/2019 11:06 08/07/2019 16:34 08/07/2019 21:49 08/08/2019 07:43  Glucose-Capillary Latest Ref Range: 70 - 99 mg/dL 75 157 (H) 203 (H) 338 (H) 78   Diabetes history: DM 2 Outpatient Diabetes medications: Levemir 32 units qhs, Novolog 3-9 units tid with meals Current orders for Inpatient glycemic control:  Levemir 15 units bid Novolog 0-15 units tid   Nepro supplement Q24 hours  Inpatient Diabetes Program Recommendations:    Agree with basal insulin reduction of Levemir 15 units bid.  Noted decadron stopped on 3/31  - consider decreasing Novolog to 0-9 units tid + hs scale (due to elevated renal function)  - Consider adding Novolog 3 units tid meal coverage with parameters if pt is eating >50% of meals.  Thanks, Tama Headings RN, MSN, BC-ADM Inpatient Diabetes Coordinator Team Pager [phone] (8a-5p)

## 2019-08-09 DIAGNOSIS — D500 Iron deficiency anemia secondary to blood loss (chronic): Secondary | ICD-10-CM

## 2019-08-09 LAB — CBC
HCT: 23.5 % — ABNORMAL LOW (ref 39.0–52.0)
Hemoglobin: 7.5 g/dL — ABNORMAL LOW (ref 13.0–17.0)
MCH: 30.7 pg (ref 26.0–34.0)
MCHC: 31.9 g/dL (ref 30.0–36.0)
MCV: 96.3 fL (ref 80.0–100.0)
Platelets: 153 10*3/uL (ref 150–400)
RBC: 2.44 MIL/uL — ABNORMAL LOW (ref 4.22–5.81)
RDW: 15.8 % — ABNORMAL HIGH (ref 11.5–15.5)
WBC: 9.2 10*3/uL (ref 4.0–10.5)
nRBC: 0 % (ref 0.0–0.2)

## 2019-08-09 LAB — BASIC METABOLIC PANEL
Anion gap: 10 (ref 5–15)
BUN: 73 mg/dL — ABNORMAL HIGH (ref 8–23)
CO2: 35 mmol/L — ABNORMAL HIGH (ref 22–32)
Calcium: 8.2 mg/dL — ABNORMAL LOW (ref 8.9–10.3)
Chloride: 95 mmol/L — ABNORMAL LOW (ref 98–111)
Creatinine, Ser: 1.51 mg/dL — ABNORMAL HIGH (ref 0.61–1.24)
GFR calc Af Amer: 48 mL/min — ABNORMAL LOW (ref >60)
GFR calc non Af Amer: 41 mL/min — ABNORMAL LOW (ref >60)
Glucose, Bld: 64 mg/dL — ABNORMAL LOW (ref 70–99)
Potassium: 3.4 mmol/L — ABNORMAL LOW (ref 3.5–5.1)
Sodium: 140 mmol/L (ref 135–145)

## 2019-08-09 LAB — GLUCOSE, CAPILLARY
Glucose-Capillary: 113 mg/dL — ABNORMAL HIGH (ref 70–99)
Glucose-Capillary: 198 mg/dL — ABNORMAL HIGH (ref 70–99)
Glucose-Capillary: 428 mg/dL — ABNORMAL HIGH (ref 70–99)
Glucose-Capillary: 128 mg/dL — ABNORMAL HIGH (ref 70–99)

## 2019-08-09 MED ORDER — SODIUM CHLORIDE 0.9 % IV SOLN
100.0000 mg | Freq: Two times a day (BID) | INTRAVENOUS | Status: DC
  Administered 2019-08-09 – 2019-08-10 (×3): 100 mg via INTRAVENOUS
  Filled 2019-08-09 (×5): qty 100

## 2019-08-09 MED ORDER — POTASSIUM CHLORIDE CRYS ER 20 MEQ PO TBCR: 40.0000 meq | EXTENDED_RELEASE_TABLET | Freq: Two times a day (BID) | ORAL | Status: DC

## 2019-08-09 MED ORDER — POTASSIUM CHLORIDE 20 MEQ/15ML (10%) PO SOLN
40.0000 meq | Freq: Two times a day (BID) | ORAL | Status: AC
  Administered 2019-08-09 – 2019-08-10 (×3): 40 meq via ORAL
  Filled 2019-08-09 (×3): qty 30

## 2019-08-09 MED ORDER — CIPROFLOXACIN HCL 500 MG PO TABS
500.0000 mg | ORAL_TABLET | Freq: Two times a day (BID) | ORAL | Status: DC
  Administered 2019-08-09 – 2019-08-12 (×6): 500 mg via ORAL
  Filled 2019-08-09 (×6): qty 1

## 2019-08-09 NOTE — Consult Note (Addendum)
[facility] Nurse Consult Note: Reason for Consult: Consult requested for right ear.  Pt is in isolation for Covid.  He has a previous history of skin cancer removals according to the EMR and has several "divots" where previous lesions have been removed to his head. He stated "I need to go see the skin Dr. soon"  during the wound assessment.  Wound type: Posterior head with full thickness wound; approx 4X4X.1cm, red and moist.  Appearance and location are not consistent with a pressure injury; suspect this site was a previous skin lesion and the skin has been unable to close related to the patient laying in bed against the affected area. Foam dressing in place to protect and promote healing.  Right inner ear with large amt dried dark brown tightly adhered scabbed material; appearance and location are typical in appearance and this might be related to some type of cancerous lesion. This is not an open wound and is beyond the scope of practice for [facility]; progress notes indicate an ENT consult is pending.  Please refer to their team for further plan of care.  Please re-consult if further assistance is needed.  Thank-you,  Julien Girt MSN, [place], [place], [place], [place]

## 2019-08-09 NOTE — Progress Notes (Addendum)
Inpatient Diabetes Program Recommendations  AACE/ADA: New Consensus Statement on Inpatient Glycemic Control (2015)  Target Ranges:  Prepandial:   less than 140 mg/dL      Peak postprandial:   less than 180 mg/dL (1-2 hours)      Critically ill patients:  140 - 180 mg/dL   Lab Results  Component Value Date   GLUCAP 113 (H) 08/09/2019   HGBA1C 7.7 (H) 06/27/2019    Review of Glycemic Control Results for MELANIE, PELLOT (MRN [id number]) as of 08/09/2019 09:00  Ref. Range 08/08/2019 07:43 08/08/2019 11:52 08/08/2019 16:33 08/08/2019 22:06 08/09/2019 08:37  Glucose-Capillary Latest Ref Range: 70 - 99 mg/dL 78     Levemir 15 units 262 (H)  Novolog 16 units 230 (H)  Novolog 6 units 168 (H)     Levemir 15 units 113 (H)    Diabetes history: DM 2 Outpatient Diabetes medications: Levemir 32 units qhs, Novolog 3-9 units tid with meals Current orders for Inpatient glycemic control:  Levemir 15 units bid Novolog 0-9 units tid  Novolog 3 units tid meal coverage  Nepro supplement Q24 hours  Inpatient Diabetes Program Recommendations:    Fasting glucose improved at 113 this am. If glucose trends increase after the meals still,   consider increasing Novolog meal coverage to 5-6 units tid if eating >50% of meals.   Thanks, Tama Headings RN, MSN, BC-ADM Inpatient Diabetes Coordinator Team Pager [phone] (8a-5p)

## 2019-08-09 NOTE — Consult Note (Signed)
Pharmacy Antibiotic Note  Miguel Torres is a 86 y.o. male admitted on 07/25/2019 with Ear infection/Cellulitis .  Pharmacy has been consulted for ciprofloxacin dosing.   Plan: Will start Cipro 500mg  po bid  Will continue to follow renal function closely daily   Will continue treating with doxycycline 100mg  bid  (to cover staph and possible pseudomonas)  Height: 5\' 8"  (172.7 cm) Weight: 101.3 kg (223 lb 5.2 oz) IBW/kg (Calculated) : 68.4  Temp (24hrs), Avg:98.2 F (36.8 C), Min:97.6 F (36.4 C), Max:98.7 F (37.1 C)  Recent Labs  Lab 08/05/19 0536 08/06/19 0554 08/07/19 0600 08/07/19 0700 08/08/19 0517 08/09/19 0531  WBC 10.7* 15.9*  --  8.8 7.8 9.2  CREATININE 1.72* 1.46* 1.58*  --  1.64* 1.51*    Estimated Creatinine Clearance: 40.5 mL/min (A) (by C-G formula based on SCr of 1.51 mg/dL (H)).    No Known Allergies  Antimicrobials this admission: For COVID + Remdesivir 3/23 >> 3/27  For UTI Ceftriaxone 3/24 >> 3/25 Cephalexin 3/25 >> 3/30  For Ear infection Cipro 4/6 >> Doxycycline 4/6 >>   Dose adjustments this admission: N/A  Microbiology results: No new cultures 3/22 UC Cefazolin sensitive Proteus/Klebsiella   Thank you for allowing pharmacy to be a part of this patient's care.  Lu Duffel, PharmD, BCPS Clinical Pharmacist 08/09/2019 2:59 PM

## 2019-08-09 NOTE — Progress Notes (Signed)
PROGRESS NOTE    Miguel Torres  TGG:[id number] DOB: 04-02-1931 DOA: 07/25/2019 PCP: Miguel Sons, MD   Brief Narrative:  Miguel Torres is a 86 y.o. male with medical history significant for hypertension, dyslipidemia, diabetes mellitus with complications of stage IV chronic kidney disease, history of peripheral vascular disease status post right BKA, history of atrial flutter on Eliquis and iron deficiency anemia.  Patient was admitted for COVID-19 pneumonia 07/25/2019.  Patient has completed treatment for Covid pneumonia.  However patient is noted to be anasarca and is being diuresed.  Renal and cardiology were following however have no further recommendations other than to continue diuresis.  Diuresis been complicated by altered mental status thought to be secondary to elevated BUN, Lasix was held and BUN decreased with improved mental status.  Patient developed melena 08/02/2019 and was transfused 1 unit PRBC.  He had remained hemodynamically stable.  GI saw patient who recommended no further investigation given fraility.  Patient's hemoglobin continues to slowly drift down, status post second unit transfusion 08/06/19.   Palliative care has been in discussions with family however family continues to want aggressive treatment of patient.  Subjective:  Patient is awake and alert.  He is complaining of right ear pain.  Admits he sleeps on his right side a lot of the time.  He is also said he would like to be cleaned up, is frustrated that he has not been.  Assessment & Plan:   Principal Problem:   Acute on chronic diastolic CHF (congestive heart failure) (HCC) Active Problems:   Anemia of chronic disease   CKD (chronic kidney disease) stage 4, GFR 15-29 ml/min (HCC)   Diabetes mellitus with nephropathy (HCC)   Atrial flutter (HCC)   PAD (peripheral artery disease) ([place])   COVID-19 virus infection   Unstageable pressure ulcer of sacral region ([place])   Melena   Upper GI  bleed   86 year old man with HTN, DM 2, CKD, PVD status post right BKA, A. fib flutter is admitted with COVID-19 pneumonia, also noted to have anasarca.  He had been discharged the week prior to admission after being treated for decompensated heart failure. Patient is presently undergoing diuresis while we follow his renal function.  Course has been complicated by melena requiring 1 unit PRBC but being hemodynamically stable.  GI recommended no further work-up given very poor baseline health status.  H/H has lifted slowly downward even though blood pressure has remained stable.  He received second unit of PRBC 08/06/19  Ear pain Patient has a thickened erythematous pinna on right ear.  His EAC was blocked with hard, dark crusty material.  Patient is tender to manipulation of pinna.  There is no tenderness along his mastoid process.  Doxycycline was started. Discussed with ENT who recommends Ciprodex drops, and addition of systemic Cipro plaque floxacillin to doxycycline to cover for staph and Pseudomonas with outpatient follow-up with them.  I noted that EAC was blocked so Ciprodex drops would not go in, they recommend cleaning of crusting prior to adding the drops. Discussed with RN, she notes she has been trying to use a warm washcloth to clean but it has been difficult. We will place wound consult clinic to see if they can help unblock the ear. Doxycycline and ciprofloxacin started today Day #1  Melena H&H continues to drop although blood pressure remaines stable. Discussed ongoing bleeding despite Protonix use and implications of prognosis with patient's son. Patient's son states he has been discussing this with  his siblings and they would like to continue with aggressive care.  He would like another GI consult to see if endoscopy could be done to stop bleeding.  Discussed with Dr. Vicente Torres who will come by and see the patient. Continue Protonix, follow CBC.  Anasarca Patient continues to diurese  well with weight down to 101 today on Lasix 40 mg IV daily Creatinine and BUN appear to be stable/improved.   Patient's protein creatinine ratio is 2.88 suggesting heavy proteinuria but not nephrotic range  Hypokalemia Potassium is a little low, will supplement.   Goals of care/palliative care Goals of care discussion had with son Miguel Torres again today given multiple competing comorbidities He states he and his siblings continue to want aggressive care for their father as noted above. He is pleased that the anasarca is improving and that his kidney function is stable if not improved. He hopes GI will be able to stop the bleeding. Patient is DNR.  Diabetes mellitus with stage 4 CKD. Very much appreciate diabetes coordinator recommendations Blood sugars are improved with their recommendations. Continue Levemir 15 twice daily Continue aspart 3 units 3 times daily AC for now, do not want to increase until we see what his sugars are doing for a little bit longer. decrease SSI to 0 to 9 unit regimen  AKI with CKD stage IV.   Creatinine is stable at his baseline, and much improved from a peak of 2.6 on admission. Mental status is much clearer than it was, likely secondary to decreasing BUN. Will need to follow both closely as we have restarted Lasix No further IV albumin is necessary given albumin 3.5.  Decompensated biventricular heart failure Patient has right heart failure as echocardiogram February 2021 reveals moderately enlarged RV size with moderate reduction in RV function but with severely elevated pulmonary artery pressures.   EF of LV is 50 to 55% with global hypokinesis. Continue oxygen to keep O2 sats greater than 92%. Cardiology was consulted-they do not recommend any further work-up. We will continue diuresis as noted above and as tolerated by kidneys.  COVID-19 infection.   Patient has completed course of treatment for COVID-19. Has completed remdesivir 5 out of 5 days. He has  completed 10 out of 10 days of Decadron Oxygen supplement as needed to keep saturation above 90%. Patient will need to be 3 weeks out of his Covid diagnosis before family can visit, family is eager to visit. He was diagnosed on 07/22/2019.  UTI   Status post treatment of UTI which grew out Proteus mirabilis and Klebsiella pneumonia  He was treated with ceftriaxone and finished up with Keflex.  Elevated troponin.   Thought to be secondary to demand ischemia on admission, peaked at 942 No further work-up warranted per cardiology  History of Atrial Flutter.  Rate controlled today. Rate controlled on present doses of metoprolol. Hold Eliquis due to GI bleed.  Peripheral arterial disease.   S/p right BKA Lower extremity ultrasound with abnormal toe brachial index. Also on left leg was seen by vascular surgery who do not have much to offer at this time. Appreciate ongoing wound care consult   Objective: Vitals:   08/08/19 1600 08/09/19 0015 08/09/19 0500 08/09/19 0800  BP: (!) 145/75 (!) 145/79    Pulse: 87 94    Resp: 20 20    Temp: 97.6 F (36.4 C) 98.7 F (37.1 C)  98.3 F (36.8 C)  TempSrc: Axillary Oral  Oral  SpO2: 99% 98%    Weight:  101.3 kg   Height:        Intake/Output Summary (Last 24 hours) at 08/09/2019 1342 Last data filed at 08/09/2019 1153 Gross per 24 hour  Intake 240 ml  Output 3425 ml  Net -3185 ml   Filed Weights   08/06/19 0500 08/08/19 0900 08/09/19 0500  Weight: 105.7 kg 103 kg 101.3 kg    Examination:  General exam: Patient is awake and alert and communicative, he seems to be a little bit more forceful and have reasonable spirits.   Respiratory system: Decreased air entry bilaterally secondary to body habitus and decreased inspiratory effort. Cardiovascular system: S1 and S2, regular Gastrointestinal system: Soft, nontender, nondistended, bowel sounds positive. Extremities: Continued 3-4+ anasarca in dependent areas as before.  Status post  BKA.   DVT prophylaxis: SCd-discontinuing Eliquis due to GI bleed. Code Status: DNR Family Communication: Spoke with son Miguel Torres yesterday regarding how patient is doing, ongoing slow GI bleed and ongoing anasarca. Disposition Plan: Unclear.  Family has been wanting to pursue aggressive care although they are now in discussion regarding how effective these attempts have been.  He continues to have anasarca although with modest improvement, also continues to have slow GI bleed.  They may request palliative care to speak with them again although they may also request GI to endoscope the patient despite high morbidity.  Discussions are in flux. We are continuing to diurese, and transfuse as needed.  Consultants:   Cardiology  Nephrology  Palliative care  Vascular surgery  GI   Procedures:  Antimicrobials:  Keflex  Data Reviewed: I have personally reviewed following labs and imaging studies  CBC: Recent Labs  Lab 08/05/19 0536 08/05/19 0536 08/06/19 0554 08/06/19 2006 08/07/19 0700 08/08/19 0517 08/09/19 0531  WBC 10.7*  --  15.9*  --  8.8 7.8 9.2  HGB 6.8*   < > 6.7* 8.0* 7.8* 7.7* 7.5*  HCT 20.8*   < > 21.3* 25.0* 23.9* 24.1* 23.5*  MCV 95.9  --  97.7  --  94.8 96.0 96.3  PLT 120*  --  126*  --  131* 138* 153   < > = values in this interval not displayed.   Basic Metabolic Panel: Recent Labs  Lab 08/03/19 0020 08/04/19 0449 08/05/19 0536 08/06/19 0554 08/07/19 0600 08/08/19 0517 08/09/19 0531  NA 135   < > 140 138 138 139 140  K 4.1   < > 3.6 3.8 3.7 3.6 3.4*  CL 95*   < > 98 97* 97* 95* 95*  CO2 30   < > 32 29 32 35* 35*  GLUCOSE 380*   < > 102* 51* 67* 114* 64*  BUN 130*   < > 111* 97* 92* 83* 73*  CREATININE 1.81*   < > 1.72* 1.46* 1.58* 1.64* 1.51*  CALCIUM 8.3*   < > 8.4* 8.5* 8.4* 8.4* 8.2*  PHOS 3.3  --   --   --   --   --   --    < > = values in this interval not displayed.   GFR: Estimated Creatinine Clearance: 40.5 mL/min (A) (by C-G formula based  on SCr of 1.51 mg/dL (H)). Liver Function Tests: Recent Labs  Lab 08/03/19 0020 08/06/19 0554  AST  --  30  ALT  --  18  ALKPHOS  --  61  BILITOT  --  1.1  PROT  --  6.1*  ALBUMIN 2.9* 3.6   No results for input(s): LIPASE, AMYLASE in the last  168 hours. No results for input(s): AMMONIA in the last 168 hours. Coagulation Profile: No results for input(s): INR, PROTIME in the last 168 hours. Cardiac Enzymes: No results for input(s): CKTOTAL, CKMB, CKMBINDEX, TROPONINI in the last 168 hours. BNP (last 3 results) No results for input(s): PROBNP in the last 8760 hours. HbA1C: No results for input(s): HGBA1C in the last 72 hours. CBG: Recent Labs  Lab 08/08/19 1152 08/08/19 1633 08/08/19 2206 08/09/19 0837 08/09/19 1151  GLUCAP 262* 230* 168* 113* 198*   Lipid Profile: No results for input(s): CHOL, HDL, LDLCALC, TRIG, CHOLHDL, LDLDIRECT in the last 72 hours. Thyroid Function Tests: No results for input(s): TSH, T4TOTAL, FREET4, T3FREE, THYROIDAB in the last 72 hours. Anemia Panel: No results for input(s): VITAMINB12, FOLATE, FERRITIN, TIBC, IRON, RETICCTPCT in the last 72 hours. Sepsis Labs: Recent Labs  Lab 08/03/19 0020  PROCALCITON 0.14    No results found for this or any previous visit (from the past 240 hour(s)).   Radiology Studies: No results found.  Scheduled Meds: . vitamin C  500 mg Oral Daily  . docusate sodium  100 mg Oral Daily  . feeding supplement (NEPRO CARB STEADY)  237 mL Oral Q24H  . ferrous sulfate  325 mg Oral Daily  . furosemide  40 mg Intravenous Daily  . gluconic acid-citric acid  30 mL Irrigation QPM  . insulin aspart  0-9 Units Subcutaneous TID WC  . insulin aspart  3 Units Subcutaneous TID WC  . insulin aspart  5 Units Subcutaneous Once  . insulin detemir  15 Units Subcutaneous BID  . Ipratropium-Albuterol  1 puff Inhalation Q6H  . loratadine  10 mg Oral Daily  . pantoprazole (PROTONIX) IV  40 mg Intravenous Q12H  . pravastatin   20 mg Oral q1800  . sodium chloride flush  3 mL Intravenous Q12H  . Vitamin D (Ergocalciferol)  50,000 Units Oral Q Sun  . zinc sulfate  220 mg Oral Daily   Continuous Infusions: . sodium chloride Stopped (08/05/19 2354)  . doxycycline (VIBRAMYCIN) IV 100 mg (08/09/19 1255)     LOS: 15 days   Time spent: 40 minutes.  Vashti Hey, MD [facility]  If 7PM-7AM, please contact night-coverage [email or web address]  08/09/2019, 1:42 PM   This record has been created using Systems analyst. Errors have been sought and corrected,but may not always be located. Such creation errors do not reflect on the standard of care.

## 2019-08-09 NOTE — Progress Notes (Signed)
Miguel Torres , MD [address] [place], [place], [place], [address] [address], [place], [place], [place], [address] Phone: [phone]  Fax: [phone]   Miguel Torres is being followed for GI bleed    Subjective: Patient confused unable to give any history   Objective: Vital signs in last 24 hours: Vitals:   08/08/19 1600 08/09/19 0015 08/09/19 0500 08/09/19 0800  BP: (!) 145/75 (!) 145/79    Pulse: 87 94    Resp: 20 20    Temp: 97.6 F (36.4 C) 98.7 F (37.1 C)  98.3 F (36.8 C)  TempSrc: Axillary Oral  Oral  SpO2: 99% 98%    Weight:   101.3 kg   Height:       Weight change:   Intake/Output Summary (Last 24 hours) at 08/09/2019 1416 Last data filed at 08/09/2019 1153 Gross per 24 hour  Intake 240 ml  Output 3425 ml  Net -3185 ml     Exam: Heart:: Regular rate and rhythm Lungs: decreased air entry b/l no added sounds Abdomen: soft, nontender, normal bowel sounds  Rt BKA  JVD raised till Rt ear lobe , short of breath on taking a few sentences.    Lab Results: @LABTEST2 @ Micro Results: No results found for this or any previous visit (from the past 240 hour(s)). Studies/Results: No results found. Medications: I have reviewed the patient's current medications. Scheduled Meds: . vitamin C  500 mg Oral Daily  . docusate sodium  100 mg Oral Daily  . feeding supplement (NEPRO CARB STEADY)  237 mL Oral Q24H  . ferrous sulfate  325 mg Oral Daily  . furosemide  40 mg Intravenous Daily  . gluconic acid-citric acid  30 mL Irrigation QPM  . insulin aspart  0-9 Units Subcutaneous TID WC  . insulin aspart  3 Units Subcutaneous TID WC  . insulin aspart  5 Units Subcutaneous Once  . insulin detemir  15 Units Subcutaneous BID  . Ipratropium-Albuterol  1 puff Inhalation Q6H  . loratadine  10 mg Oral Daily  . pantoprazole (PROTONIX) IV  40 mg Intravenous Q12H  . pravastatin  20 mg Oral q1800  . sodium chloride flush  3 mL Intravenous Q12H  . Vitamin D  (Ergocalciferol)  50,000 Units Oral Q Sun  . zinc sulfate  220 mg Oral Daily   Continuous Infusions: . sodium chloride Stopped (08/05/19 2354)  . doxycycline (VIBRAMYCIN) IV 100 mg (08/09/19 1255)   PRN Meds:.sodium chloride, acetaminophen, chlorpheniramine-HYDROcodone, guaiFENesin-dextromethorphan, ondansetron (ZOFRAN) IV, sodium chloride flush   Assessment: Principal Problem:   Acute on chronic diastolic CHF (congestive heart failure) (HCC) Active Problems:   Anemia of chronic disease   CKD (chronic kidney disease) stage 4, GFR 15-29 ml/min (HCC)   Diabetes mellitus with nephropathy (HCC)   Atrial flutter (HCC)   PAD (peripheral artery disease) ([place])   COVID-19 virus infection   Unstageable pressure ulcer of sacral region ([place])   Melena   Upper GI bleed   Miguel Torres 86 y.o. male admitted on 22 March with shortness of breath.  Treated for acute on chronic heart failure, COVID-19 pneumonia.  Previously seen by my colleague for anemia.  Drop in hemoglobin from 9.8 g to 6.3 g.  Had been having tarry black stools.  Last chest x-ray on 07/25/2019 shows mild congestive heart failure.  Gradually dropping his hemoglobin.  Received a unit of PRBCs on 08/06/2019.  Undergoing diuresis for heart failure.As the as the patient has completed  treatment with Decadron and remdesivir for COVID-19.  Diagnosed with Covid on 07/22/2019  Presently nursing has not seen his bowel movements atleast for today. Not overtly bleeding in a large qty. Overall appears very frail, JVD raised to Rt ear lobe suggesting Rt heart strain/tricuspid regurgitation. . Nursing unable to get him off oxygen as he desaturates.    Plan: 1. At this point of time his Cardio/ pulmonary function is not safe to perform any endoscopy. Risk of anesthesia is extremely high and risk of procedure > benefit.  Only indication to perform endoscopy would be in a life threatening bleed that is actively ongoing. Once he is able to go off  oxygen and improve overall clinically then can reassess. If endoscopy has to be performed very high chance of desaturation and requiring intubation which is associated with very high mortality in the setting of COVID pneumonia.   2. Suggest PRN blood transfusions , supportive care, can check iron studies and replace if low. Suggest to monitor color of stool.    LOS: 15 days   Miguel Bellows, MD 08/09/2019, 2:16 PM

## 2019-08-10 LAB — GLUCOSE, CAPILLARY
Glucose-Capillary: 42 mg/dL — CL (ref 70–99)
Glucose-Capillary: 120 mg/dL — ABNORMAL HIGH (ref 70–99)
Glucose-Capillary: 61 mg/dL — ABNORMAL LOW (ref 70–99)
Glucose-Capillary: 154 mg/dL — ABNORMAL HIGH (ref 70–99)
Glucose-Capillary: 252 mg/dL — ABNORMAL HIGH (ref 70–99)
Glucose-Capillary: 214 mg/dL — ABNORMAL HIGH (ref 70–99)

## 2019-08-10 LAB — CBC
HCT: 23.2 % — ABNORMAL LOW (ref 39.0–52.0)
Hemoglobin: 7.6 g/dL — ABNORMAL LOW (ref 13.0–17.0)
MCH: 31.7 pg (ref 26.0–34.0)
MCHC: 32.8 g/dL (ref 30.0–36.0)
MCV: 96.7 fL (ref 80.0–100.0)
Platelets: 145 10*3/uL — ABNORMAL LOW (ref 150–400)
RBC: 2.4 MIL/uL — ABNORMAL LOW (ref 4.22–5.81)
RDW: 15.7 % — ABNORMAL HIGH (ref 11.5–15.5)
WBC: 7.8 10*3/uL (ref 4.0–10.5)
nRBC: 0 % (ref 0.0–0.2)

## 2019-08-10 LAB — BASIC METABOLIC PANEL
Anion gap: 8 (ref 5–15)
BUN: 68 mg/dL — ABNORMAL HIGH (ref 8–23)
CO2: 32 mmol/L (ref 22–32)
Calcium: 8.1 mg/dL — ABNORMAL LOW (ref 8.9–10.3)
Chloride: 96 mmol/L — ABNORMAL LOW (ref 98–111)
Creatinine, Ser: 1.62 mg/dL — ABNORMAL HIGH (ref 0.61–1.24)
GFR calc Af Amer: 44 mL/min — ABNORMAL LOW (ref >60)
GFR calc non Af Amer: 38 mL/min — ABNORMAL LOW (ref >60)
Glucose, Bld: 47 mg/dL — ABNORMAL LOW (ref 70–99)
Potassium: 3.7 mmol/L (ref 3.5–5.1)
Sodium: 136 mmol/L (ref 135–145)

## 2019-08-10 MED ORDER — INSULIN DETEMIR 100 UNIT/ML ~~LOC~~ SOLN
12.0000 [IU] | Freq: Two times a day (BID) | SUBCUTANEOUS | Status: DC
  Administered 2019-08-10 – 2019-08-11 (×3): 12 [IU] via SUBCUTANEOUS
  Filled 2019-08-10 (×5): qty 0.12

## 2019-08-10 MED ORDER — PANTOPRAZOLE SODIUM 40 MG PO TBEC
40.0000 mg | DELAYED_RELEASE_TABLET | Freq: Two times a day (BID) | ORAL | Status: DC
  Administered 2019-08-10 – 2019-08-11 (×3): 40 mg via ORAL
  Filled 2019-08-10 (×3): qty 1

## 2019-08-10 NOTE — Progress Notes (Signed)
Inpatient Diabetes Program Recommendations  AACE/ADA: New Consensus Statement on Inpatient Glycemic Control (2015)  Target Ranges:  Prepandial:   less than 140 mg/dL      Peak postprandial:   less than 180 mg/dL (1-2 hours)      Critically ill patients:  140 - 180 mg/dL   Lab Results  Component Value Date   GLUCAP 120 (H) 08/10/2019   HGBA1C 7.7 (H) 06/27/2019    Review of Glycemic Control Results for Miguel Torres, Miguel Torres (MRN [id number]) as of 08/10/2019 10:03  Ref. Range 08/09/2019 16:31 08/09/2019 21:20 08/10/2019 08:20 08/10/2019 09:06 08/10/2019 09:26  Glucose-Capillary Latest Ref Range: 70 - 99 mg/dL 428 (H) Novolog 15 units 128 (H) Levemir 15 units 42 (LL) 61 (L) 120 (H)    Inpatient Diabetes Program Recommendations:   Due to fasting CBG low 42: -Decrease Levemir to 12 units bid  Thank you, Bethena Roys E. Malaka Ruffner, RN, MSN, CDE  Diabetes Coordinator Inpatient Glycemic Control Team Team Pager [phone] (8am-5pm) 08/10/2019 10:11 AM

## 2019-08-10 NOTE — Progress Notes (Signed)
PROGRESS NOTE    Miguel Torres  OHY:[id number] DOB: 1931-01-11 DOA: 07/25/2019 PCP: Miguel Sons, MD   Brief Narrative:  Miguel Torres is a 86 y.o. male with medical history significant for hypertension, dyslipidemia, diabetes mellitus with complications of stage IV chronic kidney disease, history of peripheral vascular disease status post right BKA, history of atrial flutter on Eliquis and iron deficiency anemia.  Patient was admitted for COVID-19 pneumonia 07/25/2019.  Patient has completed treatment for Covid pneumonia.  However patient is noted to be anasarca and is being diuresed.  Renal and cardiology were following however have no further recommendations other than to continue diuresis.  Diuresis been complicated by altered mental status thought to be secondary to elevated BUN, Lasix was held and BUN decreased with improved mental status.  Patient developed melena 08/02/2019 and was transfused 1 unit PRBC.  He had remained hemodynamically stable.  GI saw patient who recommended no further investigation given fraility.  Patient's hemoglobin continues to slowly drift down, status post second unit transfusion 08/06/19.   Patient is noted to have discharge and pain in his right ear with a dark scab adhering to and obstructing EAC.  No tenderness along mastoid process.  ENT was consulted who suggested covering for Staphylococcus and Pseudomonas and they will see as an outpatient and follow-up.  Patient was started on doxycycline and ciprofloxacin on 08/09/2019.  Palliative care has been in discussions with family however family continues to want aggressive treatment of patient.  Subjective:  Patient is awake and alert.  He is irritable.  He says does not like me waking him up.  He does not like me touching his ear. He does not like me examining his belly.  He wants to be left alone.    Assessment & Plan:   Principal Problem:   Acute on chronic diastolic CHF (congestive heart failure)  (HCC) Active Problems:   Anemia of chronic disease   CKD (chronic kidney disease) stage 4, GFR 15-29 ml/min (HCC)   Diabetes mellitus with nephropathy (HCC)   Atrial flutter (HCC)   PAD (peripheral artery disease) ([place])   COVID-19 virus infection   Unstageable pressure ulcer of sacral region ([place])   Melena   Upper GI bleed   87 year old man with HTN, DM 2, CKD, PVD status post right BKA, A. fib flutter is admitted with COVID-19 pneumonia, also noted to have anasarca.  He had been discharged the week prior to admission after being treated for decompensated heart failure. Patient is presently undergoing diuresis while we follow his renal function.  Course has been complicated by melena requiring 1 unit PRBC but being hemodynamically stable.  GI recommended no further work-up given very poor baseline health status.  H/H has lifted slowly downward even though blood pressure has remained stable.  He received second unit of PRBC 08/06/19   Goals of care/palliative care Long conversation with patient's son [name] again about goals for care. I noted that I was not sure what the end goal was given his multiple comorbidities and end-stage conditions. I also noted that GI felt any sort of endoscopy was much too risky. Patient son noted that he has been discussing this with his family and they feel that perhaps he is gotten as much aggressive care as he can tolerate and that it might be time to transition to palliative care. He would like to talk to [facility] again to broach the topic of transitioning to palliative care. I noted he would be able  to come in and see his father if he were to be made palliative care even though he is Covid positive. Patient is DNR.  Ear pain Erythema is decreased with initiation of Doxycycline and Ciprofloxacillin day #2. Still has significant tenderness with manipulation/palpation of pinna. No mastoid tenderness per se. Will try to send picture to ENT, they would like to see  him as an outpatient.  Melena H&H stable overnight.  Blood pressure stable. Patient had black stool this morning. Patient seen by GI yesterday who noted he was too high risk to attempt any endoscopy and to pursue conservative treatment. Continue Protonix, follow CBC.  Anasarca Continue diuresis with Lasix 40 mg IV daily Creatinine and BUN appear to be stable/improved.   Patient's protein creatinine ratio is 2.88 suggesting heavy proteinuria but not nephrotic range  Hypokalemia Normalized with supplementation.   Diabetes mellitus with stage 4 CKD. Labile blood sugars, patient had episode of hypoglycemia today despite hyperglycemia yesterday. Will follow diabetes coordinator recommendations, very much appreciated. Decreased Levemir to 12 twice daily Continue aspart 3 units 3 times daily AC for now, do not want to increase until we see what his sugars are doing for a little bit longer. decrease SSI to 0 to 9 unit regimen  AKI with CKD stage IV.   Creatinine is stable at his baseline, and much improved from a peak of 2.6 on admission. Mental status is much clearer than it was, likely secondary to decreasing BUN. Will need to follow both closely as we have restarted Lasix No further IV albumin is necessary given albumin 3.5.  Decompensated biventricular heart failure Patient has right heart failure as echocardiogram February 2021 reveals moderately enlarged RV size with moderate reduction in RV function but with severely elevated pulmonary artery pressures.   EF of LV is 50 to 55% with global hypokinesis. Continue oxygen to keep O2 sats greater than 92%. Cardiology was consulted-they do not recommend any further work-up. We will continue diuresis as noted above and as tolerated by kidneys.  COVID-19 infection.   Patient has completed course of treatment for COVID-19. Has completed remdesivir 5 out of 5 days. He has completed 10 out of 10 days of Decadron Oxygen supplement as needed  to keep saturation above 90%. Patient will need to be 3 weeks out of his Covid diagnosis before family can visit, family is eager to visit. He was diagnosed on 07/22/2019.  UTI   Status post treatment of UTI which grew out Proteus mirabilis and Klebsiella pneumonia  He was treated with ceftriaxone and finished up with Keflex.  Elevated troponin.   Thought to be secondary to demand ischemia on admission, peaked at 942 No further work-up warranted per cardiology  History of Atrial Flutter.  Rate controlled today. Rate controlled on present doses of metoprolol. Hold Eliquis due to GI bleed.  Peripheral arterial disease.   S/p right BKA Lower extremity ultrasound with abnormal toe brachial index. Also on left leg was seen by vascular surgery who do not have much to offer at this time. Appreciate ongoing wound care consult   Objective: Vitals:   08/09/19 1539 08/10/19 0031 08/10/19 0222 08/10/19 0800  BP: (!) 149/77 128/79  136/71  Pulse:  89  (!) 108  Resp:  16  20  Temp: 99 F (37.2 C) 98.3 F (36.8 C)    TempSrc: Axillary Oral    SpO2:  100%  94%  Weight:   101.3 kg   Height:  Intake/Output Summary (Last 24 hours) at 08/10/2019 1554 Last data filed at 08/10/2019 1305 Gross per 24 hour  Intake 720 ml  Output --  Net 720 ml   Filed Weights   08/08/19 0900 08/09/19 0500 08/10/19 0222  Weight: 103 kg 101.3 kg 101.3 kg    Examination:  General exam: Patient is awake and alert and communicative, he seems to be a little bit more forceful and have reasonable spirits.   Respiratory system: Decreased air entry bilaterally secondary to body habitus and decreased inspiratory effort. Cardiovascular system: S1 and S2, regular Gastrointestinal system: Soft, nontender, nondistended, bowel sounds positive. Extremities: Continued 3-4+ anasarca in dependent areas as before.  Status post BKA.   DVT prophylaxis: SCd-discontinuing Eliquis due to GI bleed. Code Status: DNR Family  Communication: Spoke with son [name] yesterday regarding how patient is doing, ongoing slow GI bleed and ongoing anasarca. Disposition Plan: Unclear.  Family has been wanting to pursue aggressive care although they are now in discussion regarding how effective these attempts have been.  He continues to have anasarca although with modest improvement, also continues to have slow GI bleed.  They may request palliative care to speak with them again although they may also request GI to endoscope the patient despite high morbidity.  Discussions are in flux. We are continuing to diurese, and transfuse as needed.  Consultants:   Cardiology  Nephrology  Palliative care  Vascular surgery  GI   Procedures:  Antimicrobials:  Keflex  Data Reviewed: I have personally reviewed following labs and imaging studies  CBC: Recent Labs  Lab 08/06/19 0554 08/06/19 0554 08/06/19 2006 08/07/19 0700 08/08/19 0517 08/09/19 0531 08/10/19 0626  WBC 15.9*  --   --  8.8 7.8 9.2 7.8  HGB 6.7*   < > 8.0* 7.8* 7.7* 7.5* 7.6*  HCT 21.3*   < > 25.0* 23.9* 24.1* 23.5* 23.2*  MCV 97.7  --   --  94.8 96.0 96.3 96.7  PLT 126*  --   --  131* 138* 153 145*   < > = values in this interval not displayed.   Basic Metabolic Panel: Recent Labs  Lab 08/06/19 0554 08/07/19 0600 08/08/19 0517 08/09/19 0531 08/10/19 0626  NA 138 138 139 140 136  K 3.8 3.7 3.6 3.4* 3.7  CL 97* 97* 95* 95* 96*  CO2 29 32 35* 35* 32  GLUCOSE 51* 67* 114* 64* 47*  BUN 97* 92* 83* 73* 68*  CREATININE 1.46* 1.58* 1.64* 1.51* 1.62*  CALCIUM 8.5* 8.4* 8.4* 8.2* 8.1*   GFR: Estimated Creatinine Clearance: 37.8 mL/min (A) (by C-G formula based on SCr of 1.62 mg/dL (H)). Liver Function Tests: Recent Labs  Lab 08/06/19 0554  AST 30  ALT 18  ALKPHOS 61  BILITOT 1.1  PROT 6.1*  ALBUMIN 3.6   No results for input(s): LIPASE, AMYLASE in the last 168 hours. No results for input(s): AMMONIA in the last 168 hours. Coagulation  Profile: No results for input(s): INR, PROTIME in the last 168 hours. Cardiac Enzymes: No results for input(s): CKTOTAL, CKMB, CKMBINDEX, TROPONINI in the last 168 hours. BNP (last 3 results) No results for input(s): PROBNP in the last 8760 hours. HbA1C: No results for input(s): HGBA1C in the last 72 hours. CBG: Recent Labs  Lab 08/09/19 2120 08/10/19 0820 08/10/19 0906 08/10/19 0926 08/10/19 1205  GLUCAP 128* 42* 61* 120* 154*   Lipid Profile: No results for input(s): CHOL, HDL, LDLCALC, TRIG, CHOLHDL, LDLDIRECT in the last 72 hours. Thyroid  Function Tests: No results for input(s): TSH, T4TOTAL, FREET4, T3FREE, THYROIDAB in the last 72 hours. Anemia Panel: No results for input(s): VITAMINB12, FOLATE, FERRITIN, TIBC, IRON, RETICCTPCT in the last 72 hours. Sepsis Labs: No results for input(s): PROCALCITON, LATICACIDVEN in the last 168 hours.  No results found for this or any previous visit (from the past 240 hour(s)).   Radiology Studies: No results found.  Scheduled Meds: . vitamin C  500 mg Oral Daily  . ciprofloxacin  500 mg Oral BID  . docusate sodium  100 mg Oral Daily  . feeding supplement (NEPRO CARB STEADY)  237 mL Oral Q24H  . ferrous sulfate  325 mg Oral Daily  . furosemide  40 mg Intravenous Daily  . gluconic acid-citric acid  30 mL Irrigation QPM  . insulin aspart  0-9 Units Subcutaneous TID WC  . insulin aspart  3 Units Subcutaneous TID WC  . insulin detemir  12 Units Subcutaneous BID  . Ipratropium-Albuterol  1 puff Inhalation Q6H  . loratadine  10 mg Oral Daily  . pantoprazole  40 mg Oral BID  . pravastatin  20 mg Oral q1800  . sodium chloride flush  3 mL Intravenous Q12H  . Vitamin D (Ergocalciferol)  50,000 Units Oral Q Sun  . zinc sulfate  220 mg Oral Daily   Continuous Infusions: . sodium chloride Stopped (08/05/19 2354)  . doxycycline (VIBRAMYCIN) IV Stopped (08/10/19 0140)     LOS: 16 days   Time spent: 40 minutes.  Vashti Hey, MD [facility]  If 7PM-7AM, please contact night-coverage [email or web address]  08/10/2019, 3:54 PM   This record has been created using Systems analyst. Errors have been sought and corrected,but may not always be located. Such creation errors do not reflect on the standard of care.

## 2019-08-10 NOTE — Progress Notes (Signed)
PHARMACIST - PHYSICIAN COMMUNICATION  CONCERNING: IV to Oral Route Change Policy  RECOMMENDATION: This patient is receiving Protonix by the intravenous route.  Based on criteria approved by the Pharmacy and Therapeutics Committee, the intravenous medication(s) is/are being converted to the equivalent oral dose form(s).  If you have questions about this conversion, please contact the [place], PharmD, BCPS Clinical Pharmacist 08/10/2019 9:59 AM

## 2019-08-11 LAB — CBC
HCT: 23.2 % — ABNORMAL LOW (ref 39.0–52.0)
Hemoglobin: 7.2 g/dL — ABNORMAL LOW (ref 13.0–17.0)
MCH: 30.8 pg (ref 26.0–34.0)
MCHC: 31 g/dL (ref 30.0–36.0)
MCV: 99.1 fL (ref 80.0–100.0)
Platelets: 140 10*3/uL — ABNORMAL LOW (ref 150–400)
RBC: 2.34 MIL/uL — ABNORMAL LOW (ref 4.22–5.81)
RDW: 15.4 % (ref 11.5–15.5)
WBC: 7.3 10*3/uL (ref 4.0–10.5)
nRBC: 0 % (ref 0.0–0.2)

## 2019-08-11 LAB — GLUCOSE, CAPILLARY
Glucose-Capillary: 146 mg/dL — ABNORMAL HIGH (ref 70–99)
Glucose-Capillary: 192 mg/dL — ABNORMAL HIGH (ref 70–99)
Glucose-Capillary: 226 mg/dL — ABNORMAL HIGH (ref 70–99)
Glucose-Capillary: 231 mg/dL — ABNORMAL HIGH (ref 70–99)

## 2019-08-11 LAB — BASIC METABOLIC PANEL
Anion gap: 8 (ref 5–15)
BUN: 67 mg/dL — ABNORMAL HIGH (ref 8–23)
CO2: 35 mmol/L — ABNORMAL HIGH (ref 22–32)
Calcium: 8.3 mg/dL — ABNORMAL LOW (ref 8.9–10.3)
Chloride: 94 mmol/L — ABNORMAL LOW (ref 98–111)
Creatinine, Ser: 1.62 mg/dL — ABNORMAL HIGH (ref 0.61–1.24)
GFR calc Af Amer: 44 mL/min — ABNORMAL LOW (ref >60)
GFR calc non Af Amer: 38 mL/min — ABNORMAL LOW (ref >60)
Glucose, Bld: 205 mg/dL — ABNORMAL HIGH (ref 70–99)
Potassium: 4.5 mmol/L (ref 3.5–5.1)
Sodium: 137 mmol/L (ref 135–145)

## 2019-08-11 MED ORDER — INSULIN ASPART 100 UNIT/ML ~~LOC~~ SOLN: 3.0000 [IU] | Freq: Three times a day (TID) | SUBCUTANEOUS | 11 refills | Status: AC

## 2019-08-11 MED ORDER — CIPROFLOXACIN HCL 500 MG PO TABS: 500.0000 mg | ORAL_TABLET | Freq: Two times a day (BID) | ORAL | 0 refills | Status: AC

## 2019-08-11 MED ORDER — INSULIN ASPART 100 UNIT/ML ~~LOC~~ SOLN: 0.0000 [IU] | Freq: Three times a day (TID) | SUBCUTANEOUS | 11 refills | Status: AC

## 2019-08-11 MED ORDER — NEPRO/CARBSTEADY PO LIQD: 237.0000 mL | ORAL | 0 refills | Status: AC

## 2019-08-11 MED ORDER — SODIUM CHLORIDE 0.9 % IV SOLN
100.0000 mg | Freq: Two times a day (BID) | INTRAVENOUS | Status: DC
  Administered 2019-08-11 – 2019-08-12 (×3): 100 mg via INTRAVENOUS
  Filled 2019-08-11 (×4): qty 100

## 2019-08-11 MED ORDER — PANTOPRAZOLE SODIUM 40 MG PO TBEC: 40.0000 mg | DELAYED_RELEASE_TABLET | Freq: Two times a day (BID) | ORAL | 0 refills | Status: AC

## 2019-08-11 MED ORDER — INSULIN DETEMIR 100 UNIT/ML ~~LOC~~ SOLN: 12.0000 [IU] | Freq: Two times a day (BID) | SUBCUTANEOUS | 11 refills | Status: AC

## 2019-08-11 MED ORDER — DOXYCYCLINE HYCLATE 50 MG PO CAPS: 100.0000 mg | ORAL_CAPSULE | Freq: Two times a day (BID) | ORAL | 0 refills | Status: AC

## 2019-08-11 NOTE — TOC Progression Note (Signed)
Transition of Care [facility]) - Progression Note    Patient Details  Name: Miguel Torres MRN: [id number] Date of Birth: 09-Sep-1930  Transition of [facility]) CM/SW Contact  Peggy Monk, Gardiner Rhyme, LCSW Phone Number: 08/11/2019, 3:11 PM  Clinical Narrative:   Spoke with [facility] to inform possible transfer back to them tomorrow. She asked for MD to order [facility] since they have a contract with them only. MD to order this. Pt to return to [facility] with hospice tomorrow. Misty CM to be back tomorrow and will follow through on this tomorrow. Magda Paganini is working on this.    Expected Discharge Plan: [place]    Expected Discharge Plan and Services Expected Discharge Plan: [place] arrangements for the past 2 months: [place]                                       Social Determinants of Health (SDOH) Interventions    Readmission Risk Interventions No flowsheet data found.

## 2019-08-11 NOTE — Progress Notes (Signed)
PROGRESS NOTE    Miguel Torres  RFF:[id number] DOB: 04/04/1931 DOA: 07/25/2019 PCP: [name], MD   Brief Narrative:  Miguel Torres is a 86 y.o. male with medical history significant for hypertension, dyslipidemia, diabetes mellitus with complications of stage IV chronic kidney disease, history of peripheral vascular disease status post right BKA, history of atrial flutter on Eliquis and iron deficiency anemia.  Patient was admitted for COVID-19 pneumonia 07/25/2019.  Patient has completed treatment for Covid pneumonia.  However patient is noted to be anasarca and is being diuresed.  Renal and cardiology were following however have no further recommendations other than to continue diuresis.  Diuresis been complicated by altered mental status thought to be secondary to elevated BUN, Lasix was held and BUN decreased with improved mental status.  Patient developed melena 08/02/2019 and was transfused 1 unit PRBC.  He had remained hemodynamically stable.  GI saw patient who recommended no further investigation given fraility.  Patient's hemoglobin continues to slowly drift down, status post second unit transfusion 08/06/19.   Patient is noted to have discharge and pain in his right ear with a dark scab adhering to and obstructing EAC.  No tenderness along mastoid process.  ENT was consulted who suggested covering for Staphylococcus and Pseudomonas and they will see as an outpatient and follow-up.  Patient was started on doxycycline and ciprofloxacin on 08/09/2019.  Palliative care has been in discussions with family however family had wanted to pursue aggressive treatment of patient.  On 08/11/2019, family agreed that patient was not getting better and they transitioned him to comfort care with discharged to hospice to be with his wife in the same room.  Subjective:  "I want to go home.  I have had enough I want to go home".  Patient denies complaints, does not want to be bothered.  He just  states he wants to go home.  Assessment & Plan:   Principal Problem:   Acute on chronic diastolic CHF (congestive heart failure) (HCC) Active Problems:   Anemia of chronic disease   CKD (chronic kidney disease) stage 4, GFR 15-29 ml/min (HCC)   Diabetes mellitus with nephropathy (HCC)   Atrial flutter (HCC)   PAD (peripheral artery disease) ([place])   COVID-19 virus infection   Unstageable pressure ulcer of sacral region ([place])   Melena   Upper GI bleed   86 year old man with HTN, DM 2, CKD, PVD status post right BKA, A. fib flutter is admitted with COVID-19 pneumonia, also noted to have anasarca.  He had been discharged the week prior to admission after being treated for decompensated heart failure. Patient is presently undergoing diuresis while we follow his renal function.  Course has been complicated by melena requiring 1 unit PRBC but being hemodynamically stable.  GI recommended no further work-up given very poor baseline health status.  H/H has lifted slowly downward even though blood pressure has remained stable.  He received second unit of PRBC 08/06/19   Goals of care/palliative care Very much appreciate involvement of palliative care, after discussion with family, patient is to be transferred to hospice tomorrow or when bed is available. We will continue present care until patient is discharged so he can be in as good a place as possible to greet his wife. Spoke with patient's son again today and told him what his father said about wanting to go home and also told him that hemoglobin had dropped by 0.4 instead of the usual 0.1, supporting family's decision making.  Patient son was appreciative of the care he got. Patient is DNR.  Ear pain Erythema is decreased with initiation of Doxycycline and Ciprofloxacillin day # 3 Continue upon discharge per patient's family preference. Somewhat decreased tenderness with manipulation/palpation of pinna. No mastoid tenderness per  se.  Melena Drop in hemoglobin by 0.4 as noted above blood pressure stable. Patient had black stool yesterday Patient seen by GI yesterday who noted he was too high risk to attempt any endoscopy and to pursue conservative treatment. Continue Protonix.  Anasarca Continue diuresis with Lasix 40 mg IV daily until patient is discharged to hospice. Patient's protein creatinine ratio is 2.88 suggesting heavy proteinuria but not nephrotic range  Hypokalemia Normalized with supplementation.   Diabetes mellitus with stage 4 CKD. We will continue present management until patient is discharged to hospice.   Levemir to 12 twice daily, aspart 3 units 3 times daily AC  SSI to 0 to 9 unit regimen  AKI with CKD stage IV.   Creatinine is stable at his baseline, and much improved from a peak of 2.6 on admission. Mental status is much clearer than it was, likely secondary to decreasing BUN. Will need to follow both closely as we have restarted Lasix No further IV albumin is necessary given albumin 3.5.  Decompensated biventricular heart failure Patient has right heart failure as echocardiogram February 2021 reveals moderately enlarged RV size with moderate reduction in RV function but with severely elevated pulmonary artery pressures.   EF of LV is 50 to 55% with global hypokinesis. Continue oxygen to keep O2 sats greater than 92%. Cardiology was consulted-they do not recommend any further work-up. We will continue diuresis as noted above and as tolerated by kidneys.  COVID-19 infection.   Patient has completed course of treatment for COVID-19. Has completed remdesivir 5 out of 5 days. He has completed 10 out of 10 days of Decadron Oxygen supplement as needed to keep saturation above 90%. Patient will need to be 3 weeks out of his Covid diagnosis before family can visit, family is eager to visit. He was diagnosed on 07/22/2019.  UTI   Status post treatment of UTI which grew out Proteus  mirabilis and Klebsiella pneumonia  He was treated with ceftriaxone and finished up with Keflex.  Elevated troponin.   Thought to be secondary to demand ischemia on admission, peaked at 942 No further work-up warranted per cardiology  History of Atrial Flutter.  Rate controlled today. Rate controlled on present doses of metoprolol. Hold Eliquis due to GI bleed.  Peripheral arterial disease.   S/p right BKA Lower extremity ultrasound with abnormal toe brachial index. Also on left leg was seen by vascular surgery who do not have much to offer at this time. Appreciate ongoing wound care consult   Objective: Vitals:   08/10/19 0800 08/10/19 2021 08/11/19 0351 08/11/19 0800  BP: 136/71 (!) 154/74 136/71 (!) 160/75  Pulse: (!) 108 (!) 101 (!) 101 (!) 103  Resp: 20 20  18   Temp:  97.9 F (36.6 C) 98.1 F (36.7 C) 98.4 F (36.9 C)  TempSrc:  Oral Oral Oral  SpO2: 94% 100% 100% 95%  Weight:    101.2 kg  Height:        Intake/Output Summary (Last 24 hours) at 08/11/2019 1442 Last data filed at 08/11/2019 0915 Gross per 24 hour  Intake 360 ml  Output 5000 ml  Net -4640 ml   Filed Weights   08/09/19 0500 08/10/19 0222 08/11/19 0800  Weight:  101.3 kg 101.3 kg 101.2 kg    Examination:  General exam: Patient is awake and alert and communicative, he seems to be a little bit more forceful and have reasonable spirits.   Respiratory system: Decreased air entry bilaterally secondary to body habitus and decreased inspiratory effort. Cardiovascular system: S1 and S2, regular Gastrointestinal system: Soft, nontender, nondistended, bowel sounds positive. Extremities: Continued 3-4+ anasarca in dependent areas as before.  Status post BKA.   DVT prophylaxis: SCd-discontinuing Eliquis due to GI bleed. Code Status: DNR Family Communication: Spoke with son Miguel Torres yesterday regarding how patient is doing, ongoing slow GI bleed and ongoing anasarca. Disposition Plan: Unclear.  Family has been  wanting to pursue aggressive care although they are now in discussion regarding how effective these attempts have been.  He continues to have anasarca although with modest improvement, also continues to have slow GI bleed.  They may request palliative care to speak with them again although they may also request GI to endoscope the patient despite high morbidity.  Discussions are in flux. We are continuing to diurese, and transfuse as needed.  Consultants:   Cardiology  Nephrology  Palliative care  Vascular surgery  GI   Procedures:  Antimicrobials:  Keflex  Data Reviewed: I have personally reviewed following labs and imaging studies  CBC: Recent Labs  Lab 08/07/19 0700 08/08/19 0517 08/09/19 0531 08/10/19 0626 08/11/19 0517  WBC 8.8 7.8 9.2 7.8 7.3  HGB 7.8* 7.7* 7.5* 7.6* 7.2*  HCT 23.9* 24.1* 23.5* 23.2* 23.2*  MCV 94.8 96.0 96.3 96.7 99.1  PLT 131* 138* 153 145* 878*   Basic Metabolic Panel: Recent Labs  Lab 08/07/19 0600 08/08/19 0517 08/09/19 0531 08/10/19 0626 08/11/19 0517  NA 138 139 140 136 137  K 3.7 3.6 3.4* 3.7 4.5  CL 97* 95* 95* 96* 94*  CO2 32 35* 35* 32 35*  GLUCOSE 67* 114* 64* 47* 205*  BUN 92* 83* 73* 68* 67*  CREATININE 1.58* 1.64* 1.51* 1.62* 1.62*  CALCIUM 8.4* 8.4* 8.2* 8.1* 8.3*   GFR: Estimated Creatinine Clearance: 37.7 mL/min (A) (by C-G formula based on SCr of 1.62 mg/dL (H)). Liver Function Tests: Recent Labs  Lab 08/06/19 0554  AST 30  ALT 18  ALKPHOS 61  BILITOT 1.1  PROT 6.1*  ALBUMIN 3.6   No results for input(s): LIPASE, AMYLASE in the last 168 hours. No results for input(s): AMMONIA in the last 168 hours. Coagulation Profile: No results for input(s): INR, PROTIME in the last 168 hours. Cardiac Enzymes: No results for input(s): CKTOTAL, CKMB, CKMBINDEX, TROPONINI in the last 168 hours. BNP (last 3 results) No results for input(s): PROBNP in the last 8760 hours. HbA1C: No results for input(s): HGBA1C in the last  72 hours. CBG: Recent Labs  Lab 08/10/19 1205 08/10/19 1729 08/10/19 2108 08/11/19 0820 08/11/19 1205  GLUCAP 154* 252* 214* 146* 192*   Lipid Profile: No results for input(s): CHOL, HDL, LDLCALC, TRIG, CHOLHDL, LDLDIRECT in the last 72 hours. Thyroid Function Tests: No results for input(s): TSH, T4TOTAL, FREET4, T3FREE, THYROIDAB in the last 72 hours. Anemia Panel: No results for input(s): VITAMINB12, FOLATE, FERRITIN, TIBC, IRON, RETICCTPCT in the last 72 hours. Sepsis Labs: No results for input(s): PROCALCITON, LATICACIDVEN in the last 168 hours.  No results found for this or any previous visit (from the past 240 hour(s)).   Radiology Studies: No results found.  Scheduled Meds: . vitamin C  500 mg Oral Daily  . ciprofloxacin  500 mg Oral  BID  . docusate sodium  100 mg Oral Daily  . feeding supplement (NEPRO CARB STEADY)  237 mL Oral Q24H  . ferrous sulfate  325 mg Oral Daily  . furosemide  40 mg Intravenous Daily  . gluconic acid-citric acid  30 mL Irrigation QPM  . insulin aspart  0-9 Units Subcutaneous TID WC  . insulin aspart  3 Units Subcutaneous TID WC  . insulin detemir  12 Units Subcutaneous BID  . Ipratropium-Albuterol  1 puff Inhalation Q6H  . loratadine  10 mg Oral Daily  . pantoprazole  40 mg Oral BID  . pravastatin  20 mg Oral q1800  . sodium chloride flush  3 mL Intravenous Q12H  . Vitamin D (Ergocalciferol)  50,000 Units Oral Q Sun  . zinc sulfate  220 mg Oral Daily   Continuous Infusions: . sodium chloride Stopped (08/05/19 2354)  . doxycycline (VIBRAMYCIN) IV 100 mg (08/11/19 0607)     LOS: 17 days   Time spent: 40 minutes.  Vashti Hey, MD [facility]  If 7PM-7AM, please contact night-coverage [email or web address]  08/11/2019, 2:42 PM   This record has been created using Systems analyst. Errors have been sought and corrected,but may not always be located. Such creation errors do not reflect on the standard  of care.

## 2019-08-11 NOTE — Care Management Important Message (Signed)
Important Message  Patient Details  Name: Miguel Torres MRN: [id number] Date of Birth: 1930/12/27   Medicare Important Message Given:  Yes     Shelbie Ammons, RN 08/11/2019, 10:40 AM

## 2019-08-11 NOTE — Progress Notes (Addendum)
Daily Progress Note   Patient Name: Miguel Torres       Date: 08/11/2019 DOB: 1930/12/02  Age: 86 y.o. MRN#: [id number] Attending Physician: Oren Binet* Primary Care Physician: [name], MD Admit Date: 07/25/2019  Reason for Consultation/Follow-up: Establishing goals of care  Subjective: Patient remains on covid isolation. He does not answer the phone. Spoke with son Miguel Torres. He states he has spoken with the primary team and would like for his father to return to his LTC facility to be with his mother whom he shares a room with. He states he would like to shift to a comfort focused care. He does ask various questions about the hospice facility which were answered. We discussed prognosis and possible trajectories depending on infection and oral intake.    He does not want his father returned to the hospital. He would like his father's diet liberated on D/C. He is okay with antibiotics if his father would be willing to take the pills and they do not cause unpleasant side effects such as N/V and diarrhea. No IVF after D/C and no feeding tube.   We discussed continuing current medications/care until D/C, and then transitioning to hospice care, so that he will be in the best place possible to spend time with his wife.   Unable to complete a MOST form in [place] as son does not have a smart phone. MOST form in paper chart for him to review if he comes to visit before D/C. Emailed HPOA and living will papers were placed in his chart. [name] is 1st HPOA on papers.       Length of Stay: 17  Current Medications: Scheduled Meds:  . vitamin C  500 mg Oral Daily  . ciprofloxacin  500 mg Oral BID  . docusate sodium  100 mg Oral Daily  . feeding supplement (NEPRO CARB STEADY)  237 mL Oral  Q24H  . ferrous sulfate  325 mg Oral Daily  . furosemide  40 mg Intravenous Daily  . gluconic acid-citric acid  30 mL Irrigation QPM  . insulin aspart  0-9 Units Subcutaneous TID WC  . insulin aspart  3 Units Subcutaneous TID WC  . insulin detemir  12 Units Subcutaneous BID  . Ipratropium-Albuterol  1 puff Inhalation Q6H  . loratadine  10 mg Oral  Daily  . pantoprazole  40 mg Oral BID  . pravastatin  20 mg Oral q1800  . sodium chloride flush  3 mL Intravenous Q12H  . Vitamin D (Ergocalciferol)  50,000 Units Oral Q Sun  . zinc sulfate  220 mg Oral Daily    Continuous Infusions: . sodium chloride Stopped (08/05/19 2354)  . doxycycline (VIBRAMYCIN) IV 100 mg (08/11/19 0607)    PRN Meds: sodium chloride, acetaminophen, chlorpheniramine-HYDROcodone, guaiFENesin-dextromethorphan, ondansetron (ZOFRAN) IV, sodium chloride flush    Vital Signs: BP (!) 160/75 (BP Location: Right Arm)   Pulse (!) 103   Temp 98.4 F (36.9 C) (Oral)   Resp 18   Ht 5\' 8"  (1.727 m)   Wt 101.2 kg   SpO2 95%   BMI 33.92 kg/m  SpO2: SpO2: 95 % O2 Device: O2 Device: Nasal Cannula O2 Flow Rate: O2 Flow Rate (L/min): 2 L/min  Intake/output summary:   Intake/Output Summary (Last 24 hours) at 08/11/2019 0959 Last data filed at 08/11/2019 0923 Gross per 24 hour  Intake 360 ml  Output 5000 ml  Net -4640 ml   LBM: Last BM Date: 08/10/19 Baseline Weight: Weight: 98.9 kg Most recent weight: Weight: 101.2 kg       Palliative Assessment/Data:    Flowsheet Rows     Most Recent Value  Intake Tab  Referral Department  Hospitalist  Unit at Time of Referral  Med/Surg Unit  Palliative Care Primary Diagnosis  Cardiac  Date Notified  07/26/19  Palliative Care Type  Return patient Palliative Care  Reason for referral  Clarify Goals of Care  Date of Admission  07/25/19  Date first seen by Palliative Care  07/27/19  # of days Palliative referral response time  1 Day(s)  # of days IP prior to Palliative  referral  1  Clinical Assessment  Psychosocial & Spiritual Assessment  Palliative Care Outcomes      Patient Active Problem List   Diagnosis Date Noted  . Melena   . Upper GI bleed   . Unstageable pressure ulcer of sacral region ([place]) 07/28/2019  . COVID-19 virus infection 07/25/2019  . Acute on chronic diastolic CHF (congestive heart failure) ([place]) 06/21/2019  . HTN (hypertension) 06/21/2019  . Type II diabetes mellitus with renal manifestations ([place]) 06/21/2019  . Iron deficiency anemia 06/21/2019  . PAD (peripheral artery disease) ([place])   . Venous ulcer of left lower extremity without varicose veins ([place]) 06/13/2019  . S/P BKA (below knee amputation) unilateral, right ([place]) 05/18/2019  . Atherosclerosis of native arteries of the extremities with ulceration ([place]) 03/14/2019  . Resides in skilled nursing facility 02/24/2018  . Benign prostatic hyperplasia with urinary obstruction 12/23/2017  . Urinary retention 12/18/2017  . Acute diastolic (congestive) heart failure ([place]) 12/17/2017  . Goals of care, counseling/discussion   . Palliative care encounter   . Congestive heart failure (CHF) ([place]) 12/16/2017  . Acute diastolic CHF (congestive heart failure) ([place]) 12/16/2017  . Acute kidney injury ([place]) 12/07/2017  . Foot ulceration, left, with fat layer exposed ([place]) 11/23/2017  . Lymphedema 11/23/2017  . Shoulder pain, right 11/04/2017  . Pressure injury of skin 11/02/2017  . Elevated troponin 11/01/2017  . Microalbuminuria 10/15/2016  . BCC (basal cell carcinoma), face 06/24/2016  . Vitamin D deficiency 07/12/2015  . Retinopathy, diabetic, proliferative ([place]) 07/02/2015  . Bleeding duodenal ulcer 02/02/2015  . Atrial flutter ([place]) 01/19/2015  . Allergic rhinitis 01/17/2015  . Anemia of chronic disease 01/17/2015  . CKD (chronic kidney disease) stage  4, GFR 15-29 ml/min (HCC) 01/17/2015  . Diabetes mellitus with nephropathy ([place]) 01/17/2015  . Edema 01/17/2015  . Gout  01/17/2015  . Amputation of right lower extremity below knee upon examination ([place]) 01/17/2015  . HLD (hyperlipidemia) 01/17/2015  . Psoriasis 01/17/2015  . CA of skin 01/17/2015  . Acquired absence of right leg below knee ([place]) 01/17/2015  . Hypertension 10/27/2014  . Mild cognitive disorder 12/05/2013  . Postop check 02/10/2013  . Cancer of skin, squamous cell 01/21/2013  . Squamous cell carcinoma of scalp and skin of neck 03/03/2011    Palliative Care Assessment & Plan    Recommendations/Plan:  Recommend return to facility with hospice.   Code Status:    Code Status Orders  (From admission, onward)         Start     Ordered   07/27/19 1420  Limited resuscitation (code)  Continuous    Question Answer Comment  In the event of cardiac or respiratory ARREST: Initiate Code Blue, Call Rapid Response Yes   In the event of cardiac or respiratory ARREST: Perform CPR Yes   In the event of cardiac or respiratory ARREST: Perform Intubation/Mechanical Ventilation Yes   In the event of cardiac or respiratory ARREST: Use NIPPV/BiPAp only if indicated Yes   In the event of cardiac or respiratory ARREST: Administer ACLS medications if indicated Yes   In the event of cardiac or respiratory ARREST: Perform Defibrillation or Cardioversion if indicated Yes   Comments If patient is pulseless and apneic, no intervention. If not pulseless and apneic, do ACLS.      07/27/19 1420        Code Status History    Date Active Date Inactive Code Status Order ID Comments User Context   07/25/2019 1558 07/27/2019 1420 Full Code [id number]  Collier Bullock, MD ED   06/21/2019 1837 07/01/2019 1759 DNR [id number]  Ivor Costa, MD Inpatient   12/16/2017 1745 12/21/2017 1956 DNR [id number]  Vaughan Basta, MD Inpatient   12/16/2017 1647 12/16/2017 1745 DNR [id number]  Vaughan Basta, MD ED   12/07/2017 1424 12/09/2017 1613 Full Code [id number]  Salary, Avel Peace, MD Inpatient   11/01/2017 1358 11/04/2017  1719 Full Code [id number]  Gladstone Lighter, MD Inpatient   Advance Care Planning Activity       Prognosis:  < 6 months.     Thank you for allowing the Palliative Medicine Team to assist in the care of this patient.  COVID-19 DISASTER DECLARATION:   PHYSICAL EXAMINATION WAS NOT POSSIBLE DUE TO TREATMENT OF COVID-19  AND CONSERVATION OF PERSONAL PROTECTIVE EQUIPMENT  Patient assessed or the symptoms described in the history of present illness.  In the context of the Global COVID-19 pandemic, which necessitated consideration that the patient might be at risk for infection with the SARS-CoV-2 virus that causes COVID-19, Institutional protocols and algorithms that pertain to the evaluation of patients at risk for COVID-19 are in a state of rapid change based on information released by regulatory bodies including the CDC and federal and state organizations. These policies and algorithms were followed during the patient's care while in hospital.  Total Time 35 min Prolonged Time Billed no      Greater than 50%  of this time was spent counseling and coordinating care related to the above assessment and plan.  Asencion Gowda, NP  Please contact Palliative Medicine Team phone at [phone] for questions and concerns.

## 2019-08-11 NOTE — Progress Notes (Signed)
Nutrition Follow-up  RD working remotely.  DOCUMENTATION CODES:   Obesity unspecified  INTERVENTION:  Continue Nepro Shake po daily, each supplement provides 425 kcal and 19 grams protein.  NUTRITION DIAGNOSIS:   Increased nutrient needs related to catabolic illness(COPD, COVID 19) as evidenced by estimated needs.  Ongoing.  GOAL:   Patient will meet greater than or equal to 90% of their needs  Progressing.  MONITOR:   PO intake, Supplement acceptance, Labs, Weight trends, Skin, I & O's  REASON FOR ASSESSMENT:   LOS    ASSESSMENT:   86 y.o. male with medical history significant for hypertension, dyslipidemia, diabetes mellitus with complications of stage IV chronic kidney disease, history of peripheral vascular disease status post right BKA, history of atrial flutter on Eliquis and iron deficiency anemia, COPD, CVA, gout, recent admission 07/01/19 for CHF, recent COVID 19 infection who is admitted with SOB  Attempted to call patient over the phone but he was unable to answer. According to chart patient is eating 50-100% of his meals now (average 69%). Patient is ordered for Nepro once daily. He appears to have had a bottle yesterday but refused his Ensure today. Palliative medicine has been following regarding goals of care. Family wants diet liberalized at d/c, no IV fluids after d/c, and no feeding tube. Plan is for patient to discharge to [facility] with hospice tomorrow.  Medications reviewed and include: vitamin C 500 mg daily, Cipro, Colace 100 mg daily, ferrous sulfate 325 mg daily, Lasix 40 mg daily IV, Novolog 0-9 units TID, Novolog 3 units TID, Levemir 12 units BID, pantoprazole, Ergocalciferol 50000 units every Sunday, zinc sulfate 220 mg daily, doxycycline.  Labs reviewed: CBG 146-252, Chloride 94, CO2 35, BUN 67, Creatinine 1.62.  Diet Order:   Diet Order            Diet general        DIET - DYS 1 Room service appropriate? Yes; Fluid consistency: Thin;  Fluid restriction: 1200 mL Fluid  Diet effective now             EDUCATION NEEDS:   Education needs have been addressed  Skin:  Skin Assessment: Skin Integrity Issues:(stg II to buttocks (2cm x 2cm); stg II head (4cm x 4cm x 0.1cm); stg I back)  Last BM:  08/10/2019 medium type 6  Height:   Ht Readings from Last 1 Encounters:  07/25/19 5\' 8"  (1.727 m)   Weight:   Wt Readings from Last 1 Encounters:  08/11/19 101.2 kg   Ideal Body Weight:  65.8 kg(adjusted for BKA)  BMI:  Body mass index is 33.92 kg/m.  Estimated Nutritional Needs:   Kcal:  2000-2300kcal/day  Protein:  100-115g/day  Fluid:  1284ml per MD  Jacklynn Barnacle, MS, RD, LDN Pager number available on Amion

## 2019-08-12 ENCOUNTER — Telehealth: Payer: Self-pay | Admitting: Family

## 2019-08-12 LAB — GLUCOSE, CAPILLARY: Glucose-Capillary: 139 mg/dL — ABNORMAL HIGH (ref 70–99)

## 2019-08-12 NOTE — TOC Transition Note (Signed)
Transition of Care [facility]) - CM/SW Discharge Note   Patient Details  Name: Miguel Torres MRN: [id number] Date of Birth: [birth year]  [facility] CM/SW Contact:  Shelbie Ammons, RN Phone Number: 08/12/2019, 12:12 PM   Clinical Narrative:   RNCM contacted facility re patient being transported back. Magda Paganini informed that they are ready for patient. RNCM completed EMS paperwork and called for transport. MD and bedside nurse aware.           Patient Goals and CMS Choice        Discharge Placement                       Discharge Plan and Services                                     Social Determinants of Health (SDOH) Interventions     Readmission Risk Interventions No flowsheet data found.

## 2019-08-12 NOTE — Plan of Care (Signed)
Discharged to [facility]

## 2019-08-12 NOTE — Discharge Summary (Signed)
Miguel Torres OJJ:[id number] DOB: [birth year] DOA: 07/25/2019  PCP: [name], MD  Admit date: 07/25/2019  Discharge date: 08/12/2019  Admitted From: Home   disposition: [facility]   Recommendations for Outpatient Follow-up:   Home Health: N/A patient being discharged to [facility] Equipment/Devices: N/A Consultations: Cardiology, nephrology, vascular surgery, GI, ENT, palliative care Discharge Condition: Comfort care CODE STATUS: DNR Diet Recommendation: Regular Diet Order            Diet general        DIET - DYS 1 Room service appropriate? Yes; Fluid consistency: Thin; Fluid restriction: 1200 mL Fluid  Diet effective now               Chief Complaint  Patient presents with  . positive covid     Brief history of present illness from the day of admission and additional interim summary    Miguel Rama Gerringeris a 86 y.o.malewith medical history significant forhypertension,dyslipidemia,diabetes mellitus with complications of stage IV chronic kidney disease,history of peripheral vascular disease status post right BKA,history of atrial flutter on Eliquis and iron deficiency anemia.  Patient was admitted for COVID-19 pneumonia 07/25/2019.  Of note patient had been discharged the month earlier on 07/01/2019 after treatment for decompensated CHF.  Hospital stay has been complicated by patient's ongoing anasarca thought to be secondary to decompensated heart failure and acute on chronic kidney insufficiency thought to be secondary to cardiao renal disease.  Patient was slowly diuresed with worsening of BUN and creatinine.  As BUN rose patient was noted to have altered mental status with increasing somnolence.  Cardiology was following however have no further recommendations  other than to continue diuresis.  Diuresis been complicated by altered mental status thought to be secondary to elevated BUN, Lasix was held and BUN decreased with improved mental status.  Patient developed melena 08/02/2019 and was transfused 1 unit PRBC.  He had remained hemodynamically stable.  GI saw patient who recommended no further investigation given fraility.  Patient was transfused a second unit on 08/06/2019. Patient's hemoglobin continues to slowly drift down.  Despite ongoing discussions with family about palliative care and unclear and goals for patient, family requested a second GI consult to see if bleeding could be stopped via endoscopy.  GI saw patient a second time and again thought patient was too frail for any intervention and recommended ongoing conservative management.  On 08/09/2019, patient was noted to have have discharge and pain in his right ear.  Physical exam revealed dark scab adhering to and obstructing EAC along with redness and tenderness of pinna. No tenderness along mastoid process.  ENT was consulted who suggested covering for Staphylococcus and Pseudomonas with follow-up as an outpatient if warranted.  Patient was started on doxycycline and ciprofloxacin on 08/09/2019.  Wound care team saw the patient and had no further recommendations other than possible biopsy to rule out malignancy.  Palliative care has been in discussions with family however family for the past 2 weeks.  Patient's  family had wanted to pursue aggressive treatment of patient.  After 2 weeks of aggressive treatment as documented above, family agreed that patient was not getting better and indeed further complications were rising.  After further discussions with palliative care, on 08/11/2019 and they transitioned him to comfort care with discharged to hospice to be with his wife in the same room.                                                                 Hospital Course    Goals of care/palliative  care Very much appreciate involvement of palliative care, after discussion with family, patient is to be transferred to [facility] with [facility] when bed is available. We will continue present care until patient is discharged so he can be in as good a place as possible to greet his wife and then patient will be transitioned to comfort care alone. Spoke with patient's son again today and told him what his father said about wanting to go home and also told him that hemoglobin had dropped by 0.4 instead of the usual 0.1, supporting family's decision making. Patient son was appreciative of the care he got. Patient is DNR.  Ear pain Erythema is decreased with initiation of Doxycycline and Ciprofloxacillin day # 3 Continue upon discharge per patient's family preference. Somewhat decreased tenderness with manipulation/palpation of pinna. No mastoid tenderness per se.  Melena Drop in hemoglobin by 0.4 as noted above blood pressure stable. Patient with ongoing intermittent melena  Patient seen by GI x 2 who noted he was too high risk to attempt any endoscopy and to pursue conservative treatment. Continue Protonix.  Anasarca Continue diuresis with Lasix 40 mg IV daily until patient is discharged to hospice. Patient's protein creatinine ratio is 2.88 suggesting heavy proteinuria but not nephrotic range  Hypokalemia Normalized with supplementation.   Diabetes mellitus with stage 4 CKD. We will continue present management until patient is discharged to hospice.   Levemir to 12 twice daily, aspart 3 units 3 times daily AC  SSI to 0 to 9 unit regimen  AKI with CKD stage IV.   Creatinine is stable at his baseline, and much improved from a peak of 2.6 on admission. Mental status is much clearer than it was, likely secondary to decreasing BUN. Will need to follow both closely as we have restarted Lasix No further IV albumin is necessary given albumin 3.5.  Decompensated  biventricular heart failure Patient has right heart failure as echocardiogram February 2021 reveals moderately enlarged RV size with moderate reduction in RV function but with severely elevated pulmonary artery pressures.   EF of LV is 50 to 55% with global hypokinesis. Continue oxygen to keep O2 sats greater than 92%. Cardiology was consulted-they do not recommend any further work-up. We will continue diuresis as noted above and as tolerated by kidneys.  COVID-19 infection.   Patient has completed course of treatment for COVID-19. Has completed remdesivir 5 out of 5 days. He has completed 10 out of 10 days of Decadron Oxygen supplement as needed to keep saturation above 90%. Patient will need to be 3 weeks out of his Covid diagnosis before family can visit, family is eager to visit. He was diagnosed on 07/22/2019 and can be taken out  of special precautions on 08/11/2019  UTI   Status post treatment of UTI which grew out Proteus mirabilis and Klebsiella pneumonia  He was treated with ceftriaxone and finished up with Keflex.  Elevated troponin.   Thought to be secondary to demand ischemia on admission, peaked at 942 No further work-up warranted per cardiology  History of Atrial Flutter.  Rate controlled today. Rate controlled on present doses of metoprolol. Hold Eliquis due to GI bleed.  Peripheral arterial disease.   S/p right BKA Lower extremity ultrasound with abnormal toe brachial index. Also on left leg was seen by vascular surgery who do not have much to offer at this time. Appreciate ongoing wound care consult     Discharge diagnosis     Principal Problem:   Acute on chronic diastolic CHF (congestive heart failure) (HCC) Active Problems:   Anemia of chronic disease   CKD (chronic kidney disease) stage 4, GFR 15-29 ml/min (HCC)   Diabetes mellitus with nephropathy (HCC)   Atrial flutter (HCC)   PAD (peripheral artery disease) ([place])   COVID-19 virus infection    Unstageable pressure ulcer of sacral region ([facility])   Melena   Upper GI bleed    Discharge instructions    Discharge Instructions    Diet general   Complete by: As directed       Discharge Medications   Allergies as of 08/12/2019   No Known Allergies     Medication List    STOP taking these medications   acetaminophen 325 MG tablet Commonly known as: TYLENOL   acetic acid 0.25 % irrigation   apixaban 2.5 MG Tabs tablet Commonly known as: ELIQUIS   aspirin 81 MG EC tablet   beta carotene w/minerals tablet   docusate sodium 100 MG capsule Commonly known as: COLACE   felodipine 2.5 MG 24 hr tablet Commonly known as: PLENDIL   ferrous sulfate 325 (65 FE) MG tablet   fexofenadine 60 MG tablet Commonly known as: ALLEGRA   hydrALAZINE 25 MG tablet Commonly known as: APRESOLINE   lovastatin 40 MG tablet Commonly known as: MEVACOR   metoprolol tartrate 25 MG tablet Commonly known as: LOPRESSOR   Vitamin D3 1.25 MG (50000 UT) Caps     TAKE these medications   bumetanide 0.5 MG tablet Commonly known as: BUMEX Take 1 tablet (0.5 mg total) by mouth daily.   ciprofloxacin 500 MG tablet Commonly known as: CIPRO Take 1 tablet (500 mg total) by mouth 2 (two) times daily for 7 days.   doxycycline 50 MG capsule Commonly known as: VIBRAMYCIN Take 2 capsules (100 mg total) by mouth 2 (two) times daily for 7 days.   feeding supplement (NEPRO CARB STEADY) Liqd Take 237 mLs by mouth daily.   furosemide 40 MG tablet Commonly known as: LASIX Take 40 mg by mouth daily.   insulin aspart 100 UNIT/ML injection Commonly known as: novoLOG Inject 0-9 Units into the skin 3 (three) times daily with meals. What changed:   how much to take  additional instructions   insulin aspart 100 UNIT/ML injection Commonly known as: novoLOG Inject 3 Units into the skin 3 (three) times daily with meals. What changed: You were already taking a medication with the same name, and  this prescription was added. Make sure you understand how and when to take each.   insulin detemir 100 UNIT/ML injection Commonly known as: LEVEMIR Inject 0.12 mLs (12 Units total) into the skin 2 (two) times daily. What changed:   how  much to take  when to take this  Another medication with the same name was removed. Continue taking this medication, and follow the directions you see here.   pantoprazole 40 MG tablet Commonly known as: PROTONIX Take 1 tablet (40 mg total) by mouth 2 (two) times daily.        Contact information for follow-up providers    [facility]. Go on 08/04/2019.   Specialty: Cardiology Why: at 2:00pm. Enter through the [facility] entrance Contact information: [place] [phone]           Contact information for after-discharge care    [facility].   Service: Skilled Nursing Contact information: [place] [phone]                  Major procedures and Radiology Reports - PLEASE review detailed and final reports thoroughly  -        DG Chest Portable 1 View  Result Date: 07/25/2019 CLINICAL DATA:  Chronic shortness of breath.  COVID positive. EXAM: PORTABLE CHEST 1 VIEW COMPARISON:  Chest x-ray dated June 27, 2019. FINDINGS: Unchanged mild cardiomegaly. Unchanged mild diffuse interstitial thickening and trace bilateral pleural effusions. No consolidation or pneumothorax. No acute osseous abnormality. IMPRESSION: 1. Unchanged mild congestive heart failure. No progressive interstitial thickening or focal opacity to suggest superimposed pneumonia. Electronically Signed   By: Titus Dubin M.D.   On: 07/25/2019 11:14   VAS [place] ABI WITH/WO TBI  Result Date: 07/25/2019 LOWER EXTREMITY DOPPLER STUDY Indications: Ulceration, and right AKA.  Limitations:  Today's exam was limited due to patient in wheelchair, an open              wound and bandages. Comparison Study: Left leg angiogram on 06/24/19: 60-70% left popliteal artery                   stenosis, occluded anterior tibial artery and non-visualized                   posterior tibial and peroneal arteries Performing Technologist: Almira Coaster RVS Supporting Technologist: Blondell Reveal RT, RDMS, RVT  Examination Guidelines: A complete evaluation includes at minimum, Doppler waveform signals and systolic blood pressure reading at the level of bilateral brachial, anterior tibial, and posterior tibial arteries, when vessel segments are accessible. Bilateral testing is considered an integral part of a complete examination. Photoelectric Plethysmograph (PPG) waveforms and toe systolic pressure readings are included as required and additional duplex testing as needed. Limited examinations for reoccurring indications may be performed as noted.  ABI Findings: +--------+------------------+-----+--------+--------+ Right   Rt Pressure (mmHg)IndexWaveformComment  +--------+------------------+-----+--------+--------+ [id number]                                     +--------+------------------+-----+--------+--------+ +---------+------------------+-----+--------+-------+ Left     Lt Pressure (mmHg)IndexWaveformComment +---------+------------------+-----+--------+-------+ Brachial 230                                    +---------+------------------+-----+--------+-------+ Great Toe93                0.40 Abnormal        +---------+------------------+-----+--------+-------+ +-------+-----------+-----------+----------------+----------------+ ABI/TBIToday's ABIToday's TBIPrevious ABI    Previous TBI     +-------+-----------+-----------+----------------+----------------+  Left   Not done   0.40       Non-compressibleNon-compressible  +-------+-----------+-----------+----------------+----------------+ Unable to adequately compare to the previous exam.  Summary: Left: The left toe-brachial index is abnormal. Unable to obtain ankle pressures and Doppler waveform due to numerous open wounds and leg bandaging. Patient also has a history of non-compressible vessels.  *See table(s) above for measurements and observations.  Electronically signed by Hortencia Pilar MD on 07/25/2019 at 11:55:27 AM.   Final     Micro Results    No results found for this or any previous visit (from the past 240 hour(s)).  Today   Subjective    Miguel Torres today stated "I want to go home.".  Objective   Blood pressure (!) 161/74, pulse 70, temperature 98.5 F (36.9 C), temperature source Oral, resp. rate (!) 21, height 5\' 8"  (1.727 m), weight 101.2 kg, SpO2 100 %.   Intake/Output Summary (Last 24 hours) at 08/12/2019 0721 Last data filed at 08/12/2019 0500 Gross per 24 hour  Intake 1100 ml  Output 1200 ml  Net -100 ml    Exam General exam: Patient is awake and alert and communicative, he seems to be a little bit more forceful and have reasonable spirits.  HEENT: Decreased erythema on pinna although still somewhat tender to handle.  Should dark material in EAC is softened with wet-to-dry dressings however it is still obstructing EAC.  Respiratory system: Decreased air entry bilaterally secondary to body habitus and decreased inspiratory effort. Cardiovascular system: S1 and S2, regular Gastrointestinal system: Soft, firm but not hard, distended but not tympanic minimal tenderness to palpation without change.  Bowel sounds positive. Extremities: Continued 3 anasarca in dependent areas as before although there is wrinkling of skin where the edema has clearly been decreased. Status post BKA   Data Review   CBC w Diff:  Lab Results  Component Value Date   WBC 7.3 08/11/2019   HGB 7.2 (L) 08/11/2019   HGB 11.0 (L) 10/15/2016   HCT 23.2  (L) 08/11/2019   HCT 34.5 (L) 10/15/2016   PLT 140 (L) 08/11/2019   PLT 183 10/15/2016   LYMPHOPCT 4 07/30/2019   LYMPHOPCT 20.7 09/30/2012   MONOPCT 9 07/30/2019   MONOPCT 14.2 09/30/2012   EOSPCT 0 07/30/2019   EOSPCT 2.9 09/30/2012   BASOPCT 1 07/30/2019   BASOPCT 0.9 09/30/2012    CMP:  Lab Results  Component Value Date   NA 137 08/11/2019   NA 138 10/15/2016   NA 140 09/30/2012   K 4.5 08/11/2019   K 4.5 09/30/2012   CL 94 (L) 08/11/2019   CL 111 (H) 09/30/2012   CO2 35 (H) 08/11/2019   CO2 24 09/30/2012   BUN 67 (H) 08/11/2019   BUN 30 (H) 10/15/2016   BUN 28 (H) 09/30/2012   CREATININE 1.62 (H) 08/11/2019   CREATININE 1.33 (H) 09/30/2012   GLU 109 07/04/2014   PROT 6.1 (L) 08/06/2019   PROT 6.7 10/15/2016   ALBUMIN 3.6 08/06/2019   ALBUMIN 3.5 10/15/2016   BILITOT 1.1 08/06/2019   BILITOT 0.4 10/15/2016   ALKPHOS 61 08/06/2019   AST 30 08/06/2019   ALT 18 08/06/2019  .   Total Time in preparing paper work, data evaluation and todays exam - 35 minutes  Vashti Hey M.D on 08/12/2019 at 7:21 AM  [facility]   Office  [phone]

## 2019-08-12 NOTE — Consult Note (Signed)
Pharmacy Antibiotic Note  Miguel Torres is a 86 y.o. male admitted on 07/25/2019 with Ear infection/Cellulitis .  Pharmacy has been consulted for ciprofloxacin dosing.   Plan: Day 4 Abx  Continue Cipro 500mg  po bid  Will continue to follow renal function closely daily   Will continue treating with doxycycline 100mg  bid   Height: 5\' 8"  (172.7 cm) Weight: 101.2 kg (223 lb 1.7 oz) IBW/kg (Calculated) : 68.4  Temp (24hrs), Avg:98.4 F (36.9 C), Min:98.2 F (36.8 C), Max:98.5 F (36.9 C)  Recent Labs  Lab 08/06/19 0554 08/07/19 0600 08/07/19 0700 08/08/19 0517 08/09/19 0531 08/10/19 0626 08/11/19 0517  WBC   < >  --  8.8 7.8 9.2 7.8 7.3  CREATININE  --  1.58*  --  1.64* 1.51* 1.62* 1.62*   < > = values in this interval not displayed.    Estimated Creatinine Clearance: 37.7 mL/min (A) (by C-G formula based on SCr of 1.62 mg/dL (H)).    No Known Allergies  Antimicrobials this admission: For COVID + Remdesivir 3/23 >> 3/27  For UTI Ceftriaxone 3/24 >> 3/25 Cephalexin 3/25 >> 3/30  For Ear infection Cipro 4/6 >> Doxycycline 4/6 >>   Dose adjustments this admission: N/A  Microbiology results: No new cultures 3/22 UC Cefazolin sensitive Proteus/Klebsiella   Thank you for allowing pharmacy to be a part of this patient's care.  Pernell Dupre, PharmD, BCPS Clinical Pharmacist 08/12/2019 8:27 AM

## 2019-08-12 NOTE — Telephone Encounter (Signed)
Patient discharged from hospital to [facility]. Unable to reach nurse to follow up on patient status, however did confirm the f/u appointment with [place] in the CHF Clinic on 5/5.    Alyse Low, [place]

## 2019-09-03 DEATH — deceased

## 2019-09-07 ENCOUNTER — Ambulatory Visit: Payer: Medicare (Managed Care) | Admitting: Family

## 2020-04-21 IMAGING — MR MR FOOT*L* W/O CM
4 of 6 series · 25 of 40 positions shown · non-contrast
Comparison: None.

CLINICAL DATA: Left lateral heel pain 4 5 months

EXAM:
MRI OF THE LEFT FOOT WITHOUT CONTRAST
TECHNIQUE: Multiplanar, multisequence MR imaging of the left foot was
performed. No intravenous contrast was administered.

[Series 9: STIR · sagittal · left · 3.0mm · 0.55mm/px · 7 of 42 slices shown]
[im 1/42]
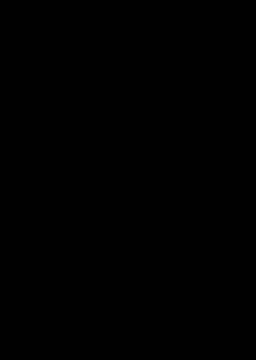
[im 6/42]
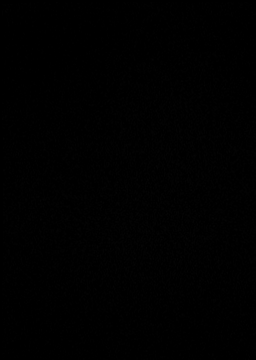
[im 12/42]
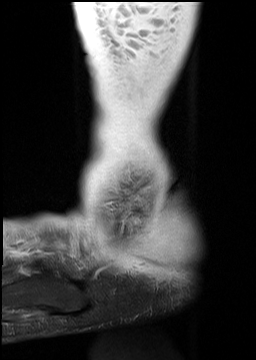
[im 18/42]
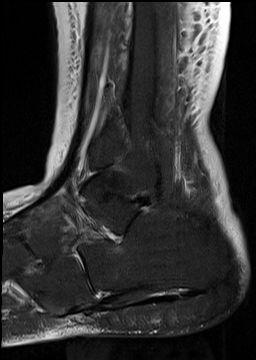
[im 24/42]
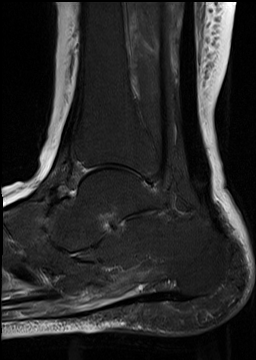
[im 30/42]
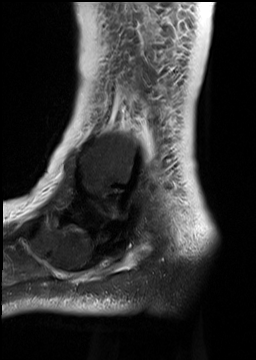
[im 36/42]
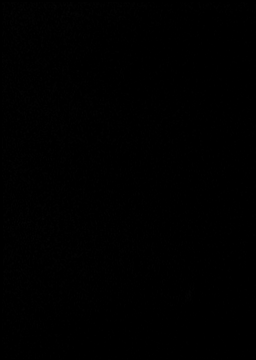

[Series 12: T2 fat-sat · axial · left · 3.0mm · 0.47mm/px · z∈[-45,+86]mm · 6 of 39 slices shown (1 of 2)]
[im 1/39]
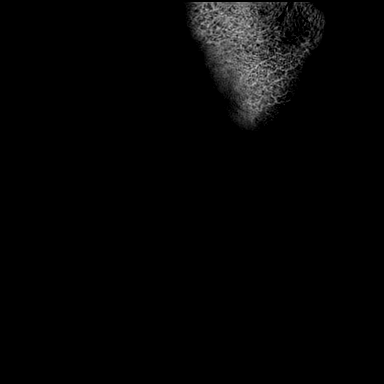
[im 8/39]
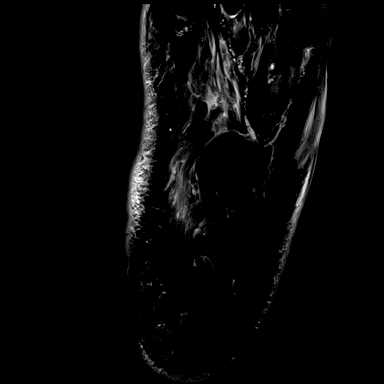
[im 16/39]
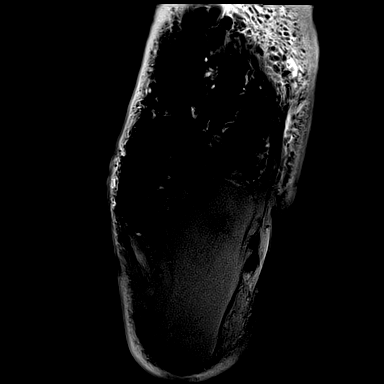
[im 23/39]
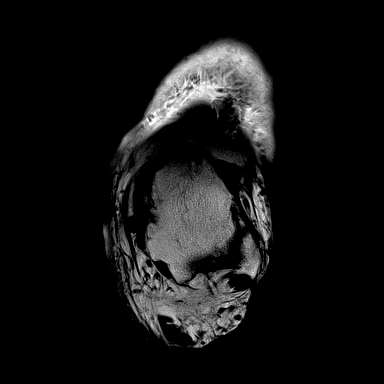
[im 31/39]
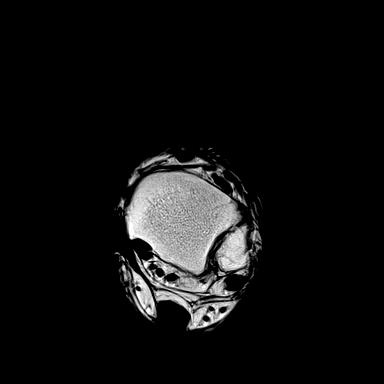
[im 39/39]
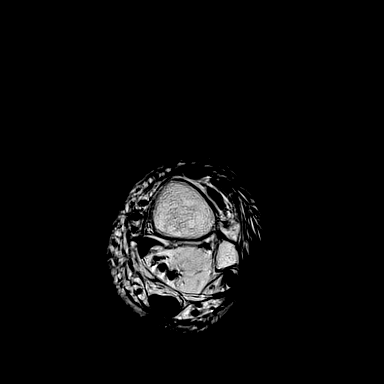

[Series 1011: T1 · coronal · left · 3.0mm · 0.31mm/px · 6 of 40 slices shown]
[im 1/40]
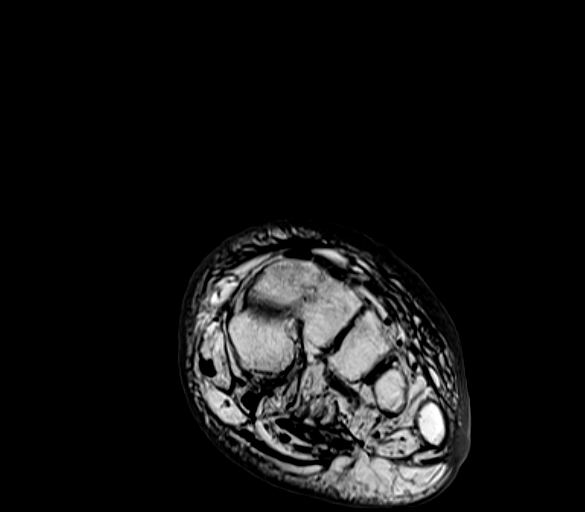
[im 8/40]
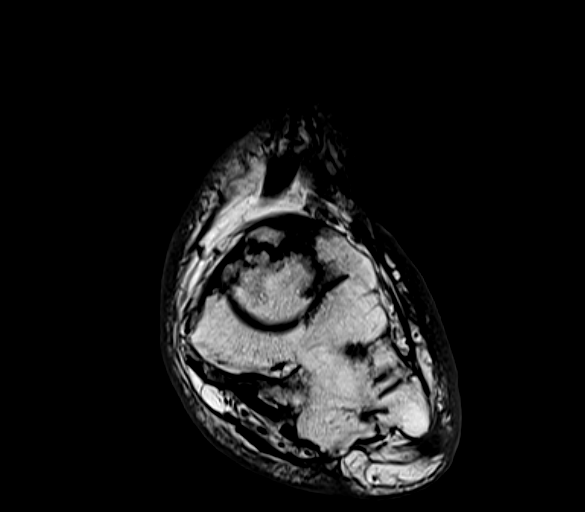
[im 16/40]
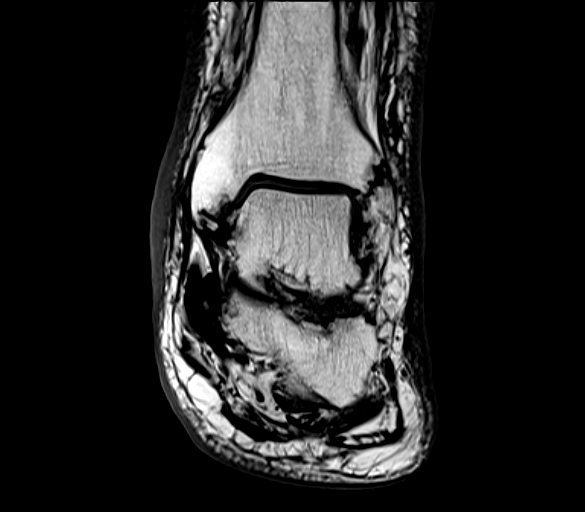
[im 24/40]
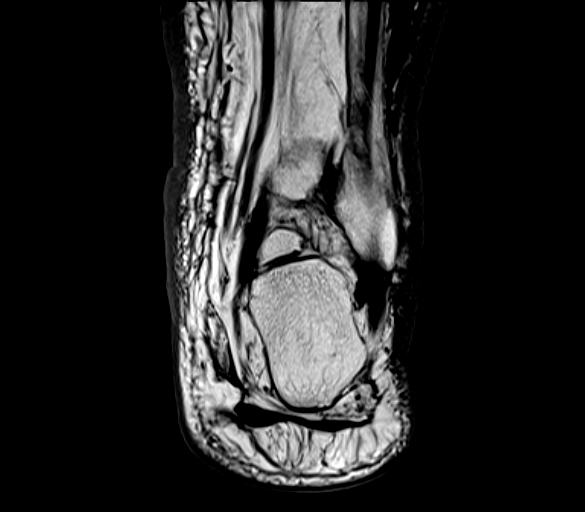
[im 32/40]
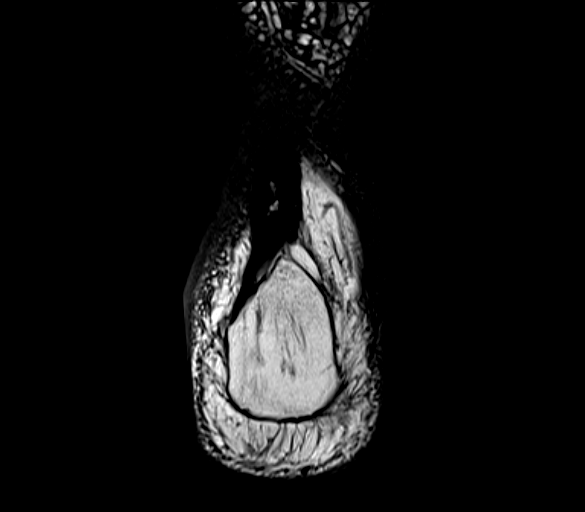
[im 40/40]
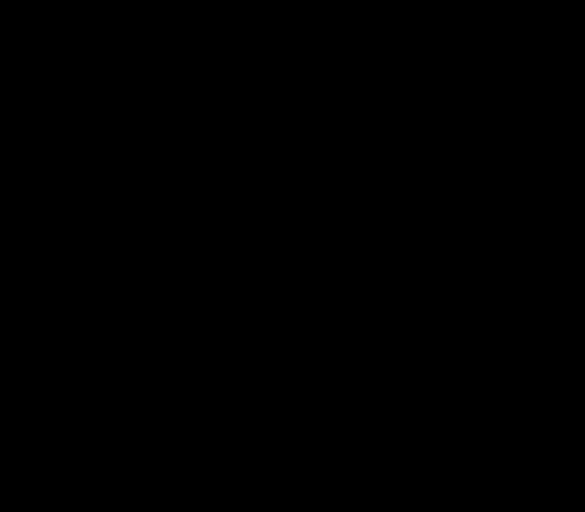

[Series 1015: T2 fat-sat · coronal · left · 3.0mm · 0.35mm/px · 6 of 40 slices shown (2 of 2)]
[im 1/40]
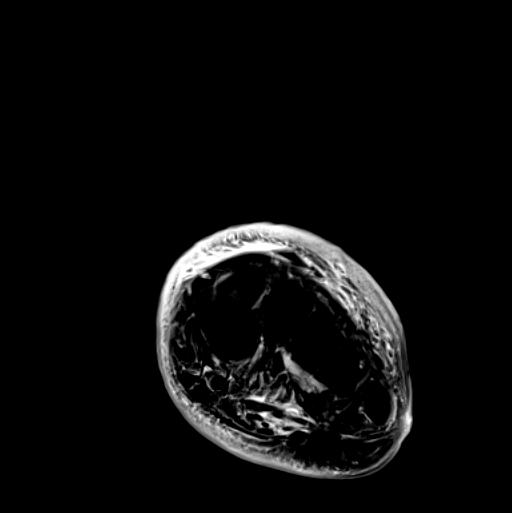
[im 8/40]
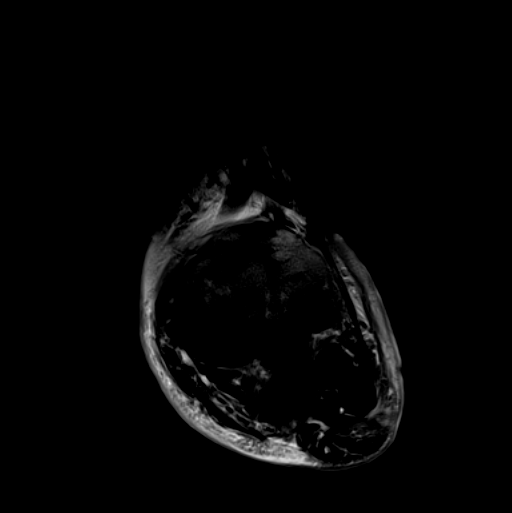
[im 16/40]
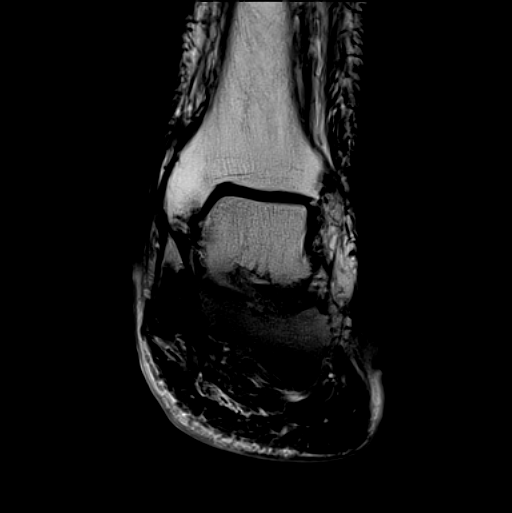
[im 24/40]
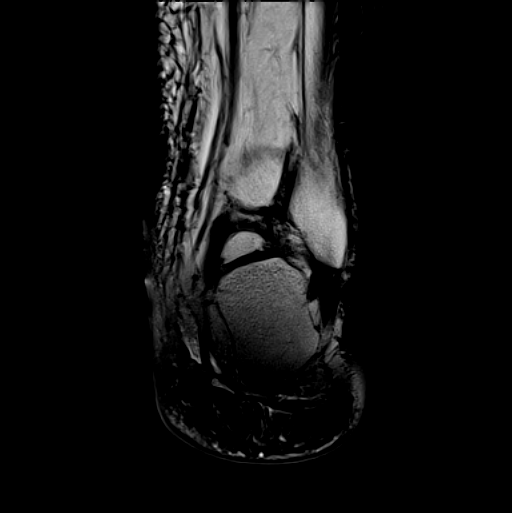
[im 32/40]
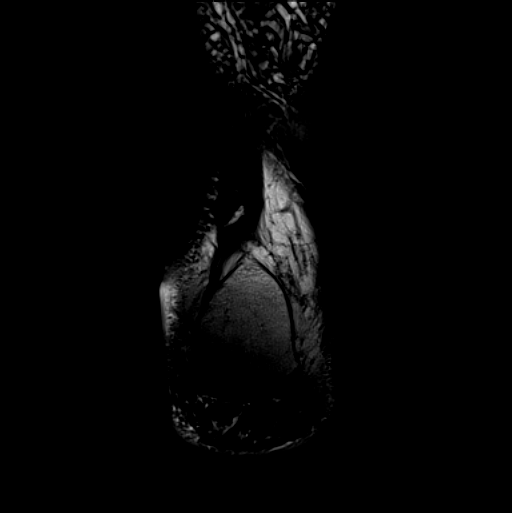
[im 40/40]
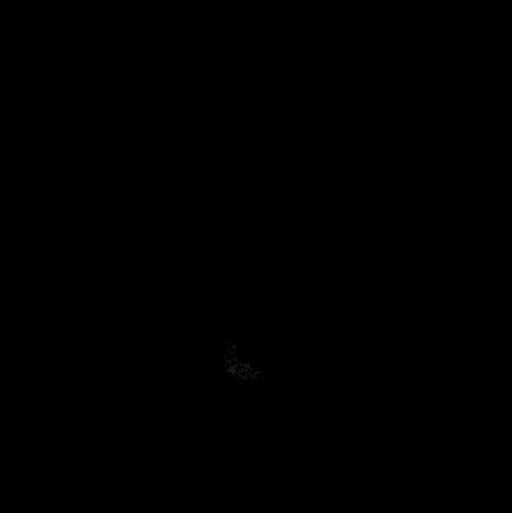

[25 of 40 positions shown; findings below may reference images not displayed]

FINDINGS: TENDONS

Peroneal: Peroneal longus tendon intact. Peroneal brevis intact.

Posteromedial: Posterior tibial tendon intact. Flexor hallucis
longus tendon intact. Flexor digitorum longus tendon intact.

Anterior: Tibialis anterior tendon intact. Extensor hallucis longus
tendon intact Extensor digitorum longus tendon intact.

Achilles: Achilles tendon is intact. Small area of ossification
within the distal Achilles tendon.

Plantar Fascia: Intact.

LIGAMENTS

Lateral: Anterior talofibular ligament intact. Calcaneofibular
ligament intact. Posterior talofibular ligament intact. Anterior and
posterior tibiofibular ligaments intact.

Medial: Deltoid ligament intact. Spring ligament intact.

CARTILAGE

Ankle Joint: No joint effusion. Normal ankle mortise. No chondral
defect.

Subtalar Joints/Sinus Tarsi: Normal subtalar joints. No subtalar
joint effusion. Normal sinus tarsi.

Bones: Prior midfoot arthrodesis with solid osseous fusion. Severe
joint space narrowing and subchondral reactive marrow changes of the
talonavicular joint. Partial-thickness cartilage loss of the
anterior subtalar joint. No periosteal reaction or bone destruction.

Soft Tissue: Soft tissue wound along the posterolateral hindfoot.
Generalized circumferential soft tissue edema in the subcutaneous
fat around the left lower leg and ankle which may be secondary to
fluid overload, venous stasis or cellulitis.
IMPRESSION: 1. No osteomyelitis of left ankle.
2. Generalized circumferential soft tissue edema in the subcutaneous
fat around the left lower leg and ankle which may be secondary to
fluid overload, venous stasis or cellulitis.
3. Prior midfoot arthrodesis with solid osseous fusion.
